# Patient Record
Sex: Male | Born: 1955 | Race: White | Hispanic: No | Marital: Single | Smoking: Never smoker
Health system: Southern US, Community
[De-identification: ages and names within clinical notes are randomized; demographics above are authoritative.]

## PROBLEM LIST (undated history)

## (undated) DIAGNOSIS — E669 Obesity, unspecified: Secondary | ICD-10-CM

## (undated) DIAGNOSIS — E119 Type 2 diabetes mellitus without complications: Secondary | ICD-10-CM

## (undated) DIAGNOSIS — I10 Essential (primary) hypertension: Secondary | ICD-10-CM

## (undated) DIAGNOSIS — H269 Unspecified cataract: Secondary | ICD-10-CM

## (undated) DIAGNOSIS — M1990 Unspecified osteoarthritis, unspecified site: Secondary | ICD-10-CM

## (undated) DIAGNOSIS — I639 Cerebral infarction, unspecified: Secondary | ICD-10-CM

## (undated) DIAGNOSIS — E11319 Type 2 diabetes mellitus with unspecified diabetic retinopathy without macular edema: Secondary | ICD-10-CM

## (undated) DIAGNOSIS — I499 Cardiac arrhythmia, unspecified: Secondary | ICD-10-CM

## (undated) DIAGNOSIS — H35039 Hypertensive retinopathy, unspecified eye: Secondary | ICD-10-CM

## (undated) DIAGNOSIS — E785 Hyperlipidemia, unspecified: Secondary | ICD-10-CM

## (undated) HISTORY — DX: Hypertensive retinopathy, unspecified eye: H35.039

## (undated) HISTORY — PX: HERNIA REPAIR: SHX51

## (undated) HISTORY — PX: JOINT REPLACEMENT: SHX530

## (undated) HISTORY — DX: Hyperlipidemia, unspecified: E78.5

## (undated) HISTORY — DX: Type 2 diabetes mellitus without complications: E11.9

## (undated) HISTORY — PX: EYE SURGERY: SHX253

## (undated) HISTORY — DX: Obesity, unspecified: E66.9

## (undated) HISTORY — DX: Unspecified cataract: H26.9

## (undated) HISTORY — DX: Type 2 diabetes mellitus with unspecified diabetic retinopathy without macular edema: E11.319

## (undated) HISTORY — DX: Cerebral infarction, unspecified: I63.9

---

## 2019-01-11 ENCOUNTER — Other Ambulatory Visit: Payer: Self-pay

## 2019-01-11 DIAGNOSIS — Z20822 Contact with and (suspected) exposure to covid-19: Secondary | ICD-10-CM

## 2019-01-12 LAB — NOVEL CORONAVIRUS, NAA: SARS-CoV-2, NAA: NOT DETECTED

## 2019-02-27 ENCOUNTER — Ambulatory Visit (INDEPENDENT_AMBULATORY_CARE_PROVIDER_SITE_OTHER): Payer: 59

## 2019-02-27 ENCOUNTER — Ambulatory Visit (INDEPENDENT_AMBULATORY_CARE_PROVIDER_SITE_OTHER): Payer: 59 | Admitting: Registered Nurse

## 2019-02-27 ENCOUNTER — Other Ambulatory Visit: Payer: Self-pay

## 2019-02-27 ENCOUNTER — Encounter: Payer: Self-pay | Admitting: Registered Nurse

## 2019-02-27 VITALS — BP 180/100 | HR 89 | Temp 97.9°F | Ht 68.0 in | Wt 245.6 lb

## 2019-02-27 DIAGNOSIS — M25562 Pain in left knee: Secondary | ICD-10-CM

## 2019-02-27 DIAGNOSIS — Z7689 Persons encountering health services in other specified circumstances: Secondary | ICD-10-CM | POA: Diagnosis not present

## 2019-02-27 DIAGNOSIS — I152 Hypertension secondary to endocrine disorders: Secondary | ICD-10-CM | POA: Insufficient documentation

## 2019-02-27 DIAGNOSIS — I10 Essential (primary) hypertension: Secondary | ICD-10-CM | POA: Diagnosis not present

## 2019-02-27 MED ORDER — TRAMADOL HCL 50 MG PO TABS: 50.0000 mg | ORAL_TABLET | Freq: Three times a day (TID) | ORAL | 0 refills | Status: AC | PRN

## 2019-02-27 MED ORDER — AMLODIPINE BESYLATE 5 MG PO TABS: 5.0000 mg | ORAL_TABLET | Freq: Every day | ORAL | 0 refills | Status: DC

## 2019-02-27 MED ORDER — MELOXICAM 15 MG PO TABS: 15.0000 mg | ORAL_TABLET | Freq: Every day | ORAL | 0 refills | Status: DC

## 2019-02-27 NOTE — Progress Notes (Signed)
New Patient Office Visit  Subjective:  Patient ID: Joe Stewart, male    DOB: 1955/08/25  Age: 64 y.o. MRN: [id number]  CC:  Chief Complaint  Patient presents with  . New Patient (Initial Visit)    patient states a few months ago some equipment fell on his left knee and getting worse having more pain and also swollen.     HPI Jeray Gentili presents for visit to establish care. C/o L knee pain. Otherwise, no concerns. BP is elevated at today's visit.   L Knee pain: originally onset a few months ago when a piece of equipment fell on his knee, though he didn't feel this was bad enough to warrant management. However, he hit it on a piece of plywood recently and is now in severe pain. At rest, it is lessened. Less painful when extended, he is having a hard time flexing it due to pain. Occasionally hip is sore, but this is relieved with heat pack, he believes this to be related to his recent limping and favoring that leg.   History reviewed. No pertinent past medical history.  Past Surgical History:  Procedure Laterality Date  . HERNIA REPAIR      Family History  Problem Relation Age of Onset  . Cancer Mother   . Diabetes Mother   . COPD Mother   . Heart disease Father   . COPD Sister   . Healthy Brother   . Healthy Son     Social History   Socioeconomic History  . Marital status: Unknown    Spouse name: Not on file  . Number of children: Not on file  . Years of education: Not on file  . Highest education level: Not on file  Occupational History  . Not on file  Tobacco Use  . Smoking status: Never Smoker  . Smokeless tobacco: Never Used  Substance and Sexual Activity  . Alcohol use: Yes    Alcohol/week: 3.0 standard drinks    Types: 3 Shots of liquor per week  . Drug use: Never  . Sexual activity: Yes  Other Topics Concern  . Not on file  Social History Narrative  . Not on file   Social Determinants of Health   Financial Resource Strain:   . Difficulty of  Paying Living Expenses: Not on file  Food Insecurity:   . Worried About Charity fundraiser in the Last Year: Not on file  . Ran Out of Food in the Last Year: Not on file  Transportation Needs:   . Lack of Transportation (Medical): Not on file  . Lack of Transportation (Non-Medical): Not on file  Physical Activity:   . Days of Exercise per Week: Not on file  . Minutes of Exercise per Session: Not on file  Stress:   . Feeling of Stress : Not on file  Social Connections:   . Frequency of Communication with Friends and Family: Not on file  . Frequency of Social Gatherings with Friends and Family: Not on file  . Attends Religious Services: Not on file  . Active Member of Clubs or Organizations: Not on file  . Attends Archivist Meetings: Not on file  . Marital Status: Not on file  Intimate Partner Violence:   . Fear of Current or Ex-Partner: Not on file  . Emotionally Abused: Not on file  . Physically Abused: Not on file  . Sexually Abused: Not on file    ROS Review of Systems  Constitutional: Negative.  HENT: Negative.   Eyes: Negative.   Respiratory: Negative.   Cardiovascular: Negative.  Negative for chest pain, palpitations and leg swelling.  Gastrointestinal: Negative.   Endocrine: Negative.   Genitourinary: Negative.   Musculoskeletal: Positive for arthralgias (L knee, per hpi), gait problem (d/t L knee pain) and joint swelling (L knee). Negative for back pain, myalgias, neck pain and neck stiffness.  Skin: Negative.   Allergic/Immunologic: Negative.   Hematological: Negative.   Psychiatric/Behavioral: Negative.   All other systems reviewed and are negative.   Objective:   Today's Vitals: Pulse 89   Temp 97.9 F (36.6 C) (Temporal)   Ht 5\' 8"  (1.727 m)   Wt 245 lb 9.6 oz (111.4 kg)   SpO2 96%   BMI 37.34 kg/m   Physical Exam Vitals and nursing note reviewed.  Constitutional:      General: He is not in acute distress.    Appearance: Normal  appearance. He is obese. He is not ill-appearing, toxic-appearing or diaphoretic.  Cardiovascular:     Rate and Rhythm: Normal rate and regular rhythm.     Heart sounds: Normal heart sounds.  Pulmonary:     Effort: Pulmonary effort is normal. No respiratory distress.  Musculoskeletal:        General: Swelling, tenderness and signs of injury present. No deformity.     Right knee: Normal.     Left knee: Swelling, effusion and erythema present. Decreased range of motion. Tenderness present over the medial joint line. Abnormal alignment. Normal pulse.     Right lower leg: No edema.     Left lower leg: No edema.  Skin:    Capillary Refill: Capillary refill takes less than 2 seconds.  Neurological:     General: No focal deficit present.     Mental Status: He is alert and oriented to person, place, and time. Mental status is at baseline.     Cranial Nerves: No cranial nerve deficit.  Psychiatric:        Mood and Affect: Mood normal.        Behavior: Behavior normal.        Thought Content: Thought content normal.        Judgment: Judgment normal.     Assessment & Plan:   Problem List Items Addressed This Visit      Cardiovascular and Mediastinum   Essential hypertension   Relevant Medications   amLODipine (NORVASC) 5 MG tablet     Other   Acute pain of left knee - Primary   Relevant Medications   meloxicam (MOBIC) 15 MG tablet   traMADol (ULTRAM) 50 MG tablet   Other Relevant Orders   DG Knee Complete 4 Views Left (Completed)   Ambulatory referral to Orthopedic Surgery    Other Visit Diagnoses    Encounter to establish care          Outpatient Encounter Medications as of 02/27/2019  Medication Sig  . amLODipine (NORVASC) 5 MG tablet Take 1 tablet (5 mg total) by mouth daily.  . meloxicam (MOBIC) 15 MG tablet Take 1 tablet (15 mg total) by mouth daily.  . traMADol (ULTRAM) 50 MG tablet Take 1 tablet (50 mg total) by mouth every 8 (eight) hours as needed for up to 5 days.     No facility-administered encounter medications on file as of 02/27/2019.    Follow-up: No follow-ups on file.   PLAN  Acute pain in L knee, xrays show joint space loss across entire joint, worst medially.  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 Referral to ortho placed  meloxicam daily and tramadol nightly for pain relief  Work note sent via MyChart  Amlodipine 5mg  Po qd for htn, recheck following visit with ortho and pain control  Patient encouraged to call clinic with any questions, comments, or concerns.  [name], NP

## 2019-02-27 NOTE — Patient Instructions (Signed)
° ° ° °  If you have lab work done today you will be contacted with your lab results within the next 2 weeks.  If you have not heard from us then please contact us. The fastest way to get your results is to register for My Chart. ° ° °IF you received an x-ray today, you will receive an invoice from [facility]. Please contact [facility] at [phone] with questions or concerns regarding your invoice.  ° °IF you received labwork today, you will receive an invoice from [facility]. Please contact [facility] at [phone] with questions or concerns regarding your invoice.  ° °Our billing staff will not be able to assist you with questions regarding bills from these companies. ° °You will be contacted with the lab results as soon as they are available. The fastest way to get your results is to activate your My Chart account. Instructions are located on the last page of this paperwork. If you have not heard from us regarding the results in 2 weeks, please contact this office. °  ° ° ° °

## 2019-03-02 ENCOUNTER — Other Ambulatory Visit: Payer: Self-pay

## 2019-03-02 ENCOUNTER — Ambulatory Visit (INDEPENDENT_AMBULATORY_CARE_PROVIDER_SITE_OTHER): Payer: 59 | Admitting: Orthopedic Surgery

## 2019-03-02 ENCOUNTER — Encounter: Payer: Self-pay | Admitting: Orthopedic Surgery

## 2019-03-02 DIAGNOSIS — M1712 Unilateral primary osteoarthritis, left knee: Secondary | ICD-10-CM

## 2019-03-02 MED ORDER — LIDOCAINE HCL 1 % IJ SOLN
5.0000 mL | INTRAMUSCULAR | Status: AC | PRN
  Administered 2019-03-02: 14:00:00 5 mL

## 2019-03-02 NOTE — Progress Notes (Signed)
Office Visit Note   Patient: Joe Stewart           Date of Birth: 06/08/1955           MRN: [id number] Visit Date: 03/02/2019 Requested by: [name], NP [place],  [place] [address] PCP: [name], NP  Subjective: Chief Complaint  Patient presents with  . Left Knee - Pain    HPI: Joe Stewart is a patient with left knee pain.  He states that several months ago's some equipment fell on his knee at work and then also had an injury where there was plywood involved when he stepped into a van.  This happened about 6 months ago.  He has been having some progressive pain and swelling since that time.  Uses a brace which helps along with meloxicam and tramadol.  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  He states that his knee is getting more bowed on the left-hand side.  He has radiographs from 02/27/2019 which are reviewed which shows abnormal lucencies on the medial femoral condyle and medial tibial plateau consistent with either AVN subchondral collapse or some other type of localized arthritic process.              ROS: All systems reviewed are negative as they relate to the chief complaint within the history of present illness.  Patient denies  fevers or chills.   Assessment & Plan: Visit Diagnoses:  1. Arthritis of left knee     Plan: Impression is left knee pain with progressive varus deformity and primarily medial compartment craterization of the joint space.  Plan is MRI scan to get a better picture of exactly what the pathologic process is that is causing his pain swelling and deformity.  I advised him to minimize amount of walking that he is doing until we get the scan.  I do see that some type of reconstructive procedure is likely in his future.  Aspiration of the knee is performed today.  No injection performed due to the possibility of surgery in his future.  Follow-Up Instructions: Return for  after MRI.   Orders:  No orders of the defined types were placed in this encounter.  No orders of the defined types were placed in this encounter.     Procedures: Large Joint Inj: L knee on 03/02/2019 2:20 PM Indications: diagnostic evaluation, joint swelling and pain Details: 18 G 1.5 in needle, superolateral approach  Arthrogram: No  Medications: 5 mL lidocaine 1 % Aspirate: 30 mL blood-tinged Outcome: tolerated well, no immediate complications Procedure, treatment alternatives, risks and benefits explained, specific risks discussed. Consent was given by the patient. Immediately prior to procedure a time out was called to verify the correct patient, procedure, equipment, support staff and site/side marked as required. Patient was prepped and draped in the usual sterile fashion.       Clinical Data: No additional findings.  Objective: Vital Signs: There were no vitals taken for this visit.  Physical Exam:   Constitutional: Patient appears well-developed HEENT:  Head: Normocephalic Eyes:EOM are normal Neck: Normal range of motion Cardiovascular: Normal rate Pulmonary/chest: Effort normal Neurologic: Patient is alert Skin: Skin is warm Psychiatric: Patient has normal mood and affect    Ortho Exam: Ortho exam demonstrates 2+ pitting edema bilateral lower extremities with no calf tenderness.  He does have varus alignment left leg not in the right-hand side.  Flexion is past  90 degrees on both sides.  Mild effusion is present on the left-hand side.  Does have venous stasis changes in both lower extremities from the mid calf distal.  Extensor mechanism is intact bilaterally.  He lacks about 5 degrees of full extension on the left compared to full extension on the right.  Specialty Comments:  No specialty comments available.  Imaging: No results found.   PMFS History: Patient Active Problem List   Diagnosis Date Noted  . Essential hypertension 02/27/2019  . Acute  pain of left knee 02/27/2019   History reviewed. No pertinent past medical history.  Family History  Problem Relation Age of Onset  . Cancer Mother   . Diabetes Mother   . COPD Mother   . Heart disease Father   . COPD Sister   . Healthy Brother   . Healthy Son     Past Surgical History:  Procedure Laterality Date  . HERNIA REPAIR     Social History   Occupational History  . Not on file  Tobacco Use  . Smoking status: Never Smoker  . Smokeless tobacco: Never Used  Substance and Sexual Activity  . Alcohol use: Yes    Alcohol/week: 3.0 standard drinks    Types: 3 Shots of liquor per week  . Drug use: Never  . Sexual activity: Yes

## 2019-03-02 NOTE — Addendum Note (Signed)
Addended by: Precious Bard on: 03/02/2019 03:21 PM   Modules accepted: Orders

## 2019-03-04 ENCOUNTER — Ambulatory Visit (HOSPITAL_BASED_OUTPATIENT_CLINIC_OR_DEPARTMENT_OTHER)
Admission: RE | Admit: 2019-03-04 | Discharge: 2019-03-04 | Disposition: A | Discharge status: Home / Self Care | Payer: 59 | Source: Ambulatory Visit | Attending: Orthopedic Surgery | Admitting: Orthopedic Surgery

## 2019-03-04 ENCOUNTER — Other Ambulatory Visit: Payer: Self-pay

## 2019-03-04 DIAGNOSIS — M1712 Unilateral primary osteoarthritis, left knee: Secondary | ICD-10-CM | POA: Insufficient documentation

## 2019-03-06 ENCOUNTER — Telehealth: Payer: Self-pay | Admitting: Orthopedic Surgery

## 2019-03-06 NOTE — Telephone Encounter (Signed)
Called patient left message to return call to schedule an appointment for MRI review with Dr dean     MRI was scheduled for 03/04/2019

## 2019-03-06 NOTE — Telephone Encounter (Signed)
Pt called in to get scheduled for an appt for mri review, I scheduled him for Thursday 03/09/19 @ 9:30, put had the mri Saturday 03/04/19 at another facility other than [facility], have we gotten the results to his mri?

## 2019-03-06 NOTE — Telephone Encounter (Signed)
I s/w patient Advised that we are able to access images. He did have it done at a [facility] facility.

## 2019-03-09 ENCOUNTER — Other Ambulatory Visit: Payer: Self-pay

## 2019-03-09 ENCOUNTER — Ambulatory Visit (INDEPENDENT_AMBULATORY_CARE_PROVIDER_SITE_OTHER): Payer: 59 | Admitting: Orthopedic Surgery

## 2019-03-09 DIAGNOSIS — M1712 Unilateral primary osteoarthritis, left knee: Secondary | ICD-10-CM

## 2019-03-10 ENCOUNTER — Encounter: Payer: Self-pay | Admitting: Orthopedic Surgery

## 2019-03-10 NOTE — Progress Notes (Signed)
Office Visit Note   Patient: Joe Stewart           Date of Birth: 03-04-1955           MRN: [id number] Visit Date: 03/09/2019 Requested by: Joe Coss, NP [place],  [place] [address] PCP: Joe Coss, NP  Subjective: Chief Complaint  Patient presents with  . Follow-up    HPI: [name] is a 64 year old patient with left knee pain.  Aspirated his left knee last clinic visit which helped him about 30%.  He has had some progressive worsening knee pain since an event which happened while he was working about 6 months ago.  Reports primarily left-sided medial knee pain.  MRI scan shows significant medial compartment arthritis with some collapse of the femoral joint surface and significant edema in the medial tibial plateau.  No fractures present.  No personal history of DVT or pulmonary embolism.  No prior knee surgery.              ROS: All systems reviewed are negative as they relate to the chief complaint within the history of present illness.  Patient denies  fevers or chills.   Assessment & Plan: Visit Diagnoses:  1. Arthritis of left knee     Plan: Impression is medial compartment arthritis with milder arthritis in the lateral and patellofemoral compartments.  Patient does have varus collapse and significantly antalgic gait.  Plan is left total knee replacement.  Risks and benefits are discussed including not limited to infection nerve vessel damage incomplete pain relief as well as incomplete restoration of function.  Currently his flexion is only about 95 degrees.  In general preop flexion is the best predictor of postop flexion.  Hopefully we can get him a little bit more flexion after the replacement.  Patient understands risk and benefits as well as the rehabilitation involved.  I think this can be his best bet at recovery.  Would like to use press-fit knee replacement if possible and bone quality allows for this younger patient.  All questions answered.  Follow-Up  Instructions: No follow-ups on file.   Orders:  No orders of the defined types were placed in this encounter.  No orders of the defined types were placed in this encounter.     Procedures: No procedures performed   Clinical Data: No additional findings.  Objective: Vital Signs: There were no vitals taken for this visit.  Physical Exam:   Constitutional: Patient appears well-developed HEENT:  Head: Normocephalic Eyes:EOM are normal Neck: Normal range of motion Cardiovascular: Normal rate Pulmonary/chest: Effort normal Neurologic: Patient is alert Skin: Skin is warm Psychiatric: Patient has normal mood and affect    Ortho Exam: Ortho exam demonstrates full active and passive range of motion of that right knee.  Left knee has about 95 degrees of flexion and lacks 10 degrees of full extension.  Varus alignment is present.  About 1-2+ pitting edema present in bilateral lower extremities.  Pedal pulses palpable.  No groin pain with internal extra rotation of the leg.  No skin changes in that left knee region. ecialty Comments:  No specialty comments available.  Imaging: No results found.   PMFS History: Patient Active Problem List   Diagnosis Date Noted  . Essential hypertension 02/27/2019  . Acute pain of left knee 02/27/2019   History reviewed. No pertinent past medical history.  Family History  Problem Relation Age of Onset  . Cancer Mother   . Diabetes Mother   .  COPD Mother   . Heart disease Father   . COPD Sister   . Healthy Brother   . Healthy Son     Past Surgical History:  Procedure Laterality Date  . HERNIA REPAIR     Social History   Occupational History  . Not on file  Tobacco Use  . Smoking status: Never Smoker  . Smokeless tobacco: Never Used  Substance and Sexual Activity  . Alcohol use: Yes    Alcohol/week: 3.0 standard drinks    Types: 3 Shots of liquor per week  . Drug use: Never  . Sexual activity: Yes

## 2019-03-21 ENCOUNTER — Other Ambulatory Visit: Payer: Self-pay | Admitting: Registered Nurse

## 2019-03-21 DIAGNOSIS — M25562 Pain in left knee: Secondary | ICD-10-CM

## 2019-03-21 NOTE — Pre-Procedure Instructions (Signed)
Joe Stewart  03/21/2019      [id number] [name], [place] [address] Phone: [phone] Fax: [phone]    Your procedure is scheduled on 03/28/19.  Report to [facility] Admitting at 530 A.M.  Call this number if you have problems the morning of surgery:  [phone]   Remember:  Do not eat or drink after midnight.  You may drink clear liquids until 430am .  Clear liquids allowed are:                    Water, Juice (non-citric and without pulp), Carbonated beverages, Clear Tea and Black Coffee only    Take these medicines the morning of surgery with A SIP OF WATER -----NORVASC,ULTRAM    Do not wear jewelry, make-up or nail polish.  Do not wear lotions, powders, or perfumes, or deodorant.  Do not shave 48 hours prior to surgery.  Men may shave face and neck.  Do not bring valuables to the hospital.  [facility] is not responsible for any belongings or valuables.  Contacts, dentures or bridgework may not be worn into surgery.  Leave your suitcase in the car.  After surgery it may be brought to your room.  For patients admitted to the hospital, discharge time will be determined by your treatment team.  Patients discharged the day of surgery will not be allowed to drive home.   Enhanced Recovery after Surgery for Orthopedics Enhanced Recovery after Surgery is a protocol used to improve the stress on your body and your recovery after surgery.  Patient Instructions  . The night before surgery:  o No food after midnight. ONLY clear liquids after midnight  .  Marland Kitchen The day of surgery (if you do NOT have diabetes):  o Drink ONE (1) Pre-Surgery Clear Ensure as directed.   o This drink was given to you during your hospital  pre-op appointment visit. o The pre-op nurse will instruct you on the time to drink the  Pre-Surgery Ensure depending on your surgery time. o  Finish the drink at the designated time by the pre-op nurse.  o Nothing else to drink after completing the  Pre-Surgery Clear Ensure.  . The day of surgery (if you have diabetes): o  o Drink ONE (1) Gatorade 2 (G2) as directed. o This drink was given to you during your hospital  pre-op appointment visit.  o The pre-op nurse will instruct you on the time to drink the   Gatorade 2 (G2) depending on your surgery time. o Color of the Gatorade may vary. Red is not allowed. o Nothing else to drink after completing the  Gatorade 2 (G2).         If you have questions, please contact your surgeon's office.Do not take any aspirin,anti-inflammatories,vitamins,or herbal supplements 5-7 days prior to surgery.  Special instructions: [facility] - Preparing for Surgery  Before surgery, you can play an important role.  Because skin is not sterile, your skin needs to be as free of germs as possible.  You can reduce the number of germs on you skin by washing with CHG (chlorahexidine gluconate) soap before surgery.  CHG is an antiseptic cleaner which kills germs and bonds with the skin to continue killing germs even after washing.  Oral Hygiene is also important in reducing the risk of infection.  Remember to brush your teeth with your  regular toothpaste the morning of surgery.  Please DO NOT use if you have an allergy to CHG or antibacterial soaps.  If your skin becomes reddened/irritated stop using the CHG and inform your nurse when you arrive at Short Stay.  Do not shave (including legs and underarms) for at least 48 hours prior to the first CHG shower.  You may shave your face.  Please follow these instructions carefully:   1.  Shower with CHG Soap the night before surgery and the morning of Surgery.  2.  If you choose to wash your hair, wash your hair first as usual with your normal shampoo.  3.  After you shampoo, rinse your hair and body thoroughly to remove the shampoo. 4.  Use CHG as you would  any other liquid soap.  You can apply chg directly to the skin and wash gently with a      scrungie or washcloth.           5.  Apply the CHG Soap to your body ONLY FROM THE NECK DOWN.   Do not use on open wounds or open sores. Avoid contact with your eyes, ears, mouth and genitals (private parts).  Wash genitals (private parts) with your normal soap.  6.  Wash thoroughly, paying special attention to the area where your surgery will be performed.  7.  Thoroughly rinse your body with warm water from the neck down.  8.  DO NOT shower/wash with your normal soap after using and rinsing off the CHG Soap.  9.  Pat yourself dry with a clean towel.            10.  Wear clean pajamas.            11.  Place clean sheets on your bed the night of your first shower and do not sleep with pets.  Day of Surgery  Do not apply any lotions/deoderants the morning of surgery.   Please wear clean clothes to the hospital/surgery center. Remember to brush your teeth with toothpaste.    Please read over the following fact sheets that you were given. MRSA Information

## 2019-03-22 ENCOUNTER — Encounter (HOSPITAL_COMMUNITY)
Admission: RE | Admit: 2019-03-22 | Discharge: 2019-03-22 | Disposition: A | Discharge status: Home / Self Care | Payer: 59 | Source: Ambulatory Visit | Attending: Orthopedic Surgery | Admitting: Orthopedic Surgery

## 2019-03-22 ENCOUNTER — Encounter (HOSPITAL_COMMUNITY): Payer: Self-pay

## 2019-03-22 ENCOUNTER — Other Ambulatory Visit: Payer: Self-pay

## 2019-03-22 DIAGNOSIS — I4510 Unspecified right bundle-branch block: Secondary | ICD-10-CM | POA: Diagnosis not present

## 2019-03-22 DIAGNOSIS — R9431 Abnormal electrocardiogram [ECG] [EKG]: Secondary | ICD-10-CM | POA: Insufficient documentation

## 2019-03-22 DIAGNOSIS — I10 Essential (primary) hypertension: Secondary | ICD-10-CM | POA: Insufficient documentation

## 2019-03-22 DIAGNOSIS — Z01818 Encounter for other preprocedural examination: Secondary | ICD-10-CM | POA: Diagnosis present

## 2019-03-22 HISTORY — DX: Unspecified osteoarthritis, unspecified site: M19.90

## 2019-03-22 HISTORY — DX: Essential (primary) hypertension: I10

## 2019-03-22 LAB — SURGICAL PCR SCREEN
MRSA, PCR: NEGATIVE
Staphylococcus aureus: NEGATIVE

## 2019-03-22 LAB — BASIC METABOLIC PANEL
Anion gap: 13 (ref 5–15)
BUN: 20 mg/dL (ref 8–23)
CO2: 23 mmol/L (ref 22–32)
Calcium: 9.5 mg/dL (ref 8.9–10.3)
Chloride: 103 mmol/L (ref 98–111)
Creatinine, Ser: 0.92 mg/dL (ref 0.61–1.24)
GFR calc Af Amer: >60 mL/min (ref >60)
GFR calc non Af Amer: >60 mL/min (ref >60)
Glucose, Bld: 185 mg/dL — ABNORMAL HIGH (ref 70–99)
Potassium: 4.6 mmol/L (ref 3.5–5.1)
Sodium: 139 mmol/L (ref 135–145)

## 2019-03-22 LAB — URINALYSIS, ROUTINE W REFLEX MICROSCOPIC
Bilirubin Urine: NEGATIVE
Glucose, UA: NEGATIVE mg/dL
Hgb urine dipstick: NEGATIVE
Ketones, ur: 5 mg/dL — AB
Leukocytes,Ua: NEGATIVE
Nitrite: NEGATIVE
Protein, ur: 30 mg/dL — AB
Specific Gravity, Urine: 1.025 (ref 1.005–1.030)
pH: 5 (ref 5.0–8.0)

## 2019-03-22 LAB — CBC
HCT: 43.1 % (ref 39.0–52.0)
Hemoglobin: 13.8 g/dL (ref 13.0–17.0)
MCH: 30.1 pg (ref 26.0–34.0)
MCHC: 32 g/dL (ref 30.0–36.0)
MCV: 93.9 fL (ref 80.0–100.0)
Platelets: 254 10*3/uL (ref 150–400)
RBC: 4.59 MIL/uL (ref 4.22–5.81)
RDW: 13.2 % (ref 11.5–15.5)
WBC: 7.1 10*3/uL (ref 4.0–10.5)
nRBC: 0 % (ref 0.0–0.2)

## 2019-03-22 NOTE — Pre-Procedure Instructions (Signed)
Your procedure is scheduled on Tuesday, February 23rd, from 07:30 AM to 10:28 AM.  Report to [facility] Main Entrance "A" at 05:30 A.M., and check in at the Admitting office.  Call this number if you have problems the morning of surgery:  [phone]  Call [phone] if you have any questions prior to your surgery date Monday-Friday 8am-4pm    Remember:  Do not eat after midnight the night before your surgery.  You may drink clear liquids until 04:30 AM the morning of your surgery.    Clear liquids allowed are: Water, Non-Citrus Juices (without pulp), Carbonated Beverages, Clear Tea, Black Coffee Only, and Gatorade  Please complete your PRE-SURGERY ENSURE that was provided to you by 04:30 AM the morning of surgery.  Please, if able, drink it in one setting. DO NOT SIP.     Take these medicines the morning of surgery with A SIP OF WATER : amLODipine (NORVASC) traMADol (ULTRAM)- if needed   7 days prior to surgery STOP taking any Aspirin (unless otherwise instructed by your surgeon), meloxicam (MOBIC), Aleve, Naproxen, Ibuprofen, Motrin, Advil, Goody's, BC's, all herbal medications, fish oil, and all vitamins.    The Morning of Surgery  Do not wear jewelry.  Do not wear lotions, powders, colognes, or deodorant  Men may shave face and neck.  Do not bring valuables to the hospital.  [facility] is not responsible for any belongings or valuables.  If you are a smoker, DO NOT Smoke 24 hours prior to surgery  If you wear a CPAP at night please bring your mask the morning of surgery   Remember that you must have someone to transport you home after your surgery, and remain with you for 24 hours if you are discharged the same day.   Please bring cases for contacts, glasses, hearing aids, dentures or bridgework because it cannot be worn into surgery.    Leave your suitcase in the car.  After surgery it may be brought to your room.  For patients admitted to the hospital,  discharge time will be determined by your treatment team.  Patients discharged the day of surgery will not be allowed to drive home.    Special instructions:   [place]- Preparing For Surgery  Before surgery, you can play an important role. Because skin is not sterile, your skin needs to be as free of germs as possible. You can reduce the number of germs on your skin by washing with CHG (chlorahexidine gluconate) Soap before surgery.  CHG is an antiseptic cleaner which kills germs and bonds with the skin to continue killing germs even after washing.    Oral Hygiene is also important to reduce your risk of infection.  Remember - BRUSH YOUR TEETH THE MORNING OF SURGERY WITH YOUR REGULAR TOOTHPASTE  Please do not use if you have an allergy to CHG or antibacterial soaps. If your skin becomes reddened/irritated stop using the CHG.  Do not shave (including legs and underarms) for at least 48 hours prior to first CHG shower. It is OK to shave your face.  Please follow these instructions carefully.   1. Shower the NIGHT BEFORE SURGERY and the MORNING OF SURGERY with CHG Soap.   2. If you chose to wash your hair, wash your hair first as usual with your normal shampoo.  3. After you shampoo, rinse your hair and body thoroughly to remove the shampoo.  4. Use CHG as you would any other liquid soap. You can apply  CHG directly to the skin and wash gently with a scrungie or a clean washcloth.   5. Apply the CHG Soap to your body ONLY FROM THE NECK DOWN.  Do not use on open wounds or open sores. Avoid contact with your eyes, ears, mouth and genitals (private parts). Wash Face and genitals (private parts)  with your normal soap.   6. Wash thoroughly, paying special attention to the area where your surgery will be performed.  7. Thoroughly rinse your body with warm water from the neck down.  8. DO NOT shower/wash with your normal soap after using and rinsing off the CHG Soap.  9. Pat yourself dry  with a CLEAN TOWEL.  10. Wear CLEAN PAJAMAS to bed the night before surgery, wear comfortable clothes the morning of surgery  11. Place CLEAN SHEETS on your bed the night of your first shower and DO NOT SLEEP WITH PETS.    Day of Surgery:  Please shower the morning of surgery with the CHG soap Do not apply any deodorants/lotions. Please wear clean clothes to the hospital/surgery center.   Remember to brush your teeth WITH YOUR REGULAR TOOTHPASTE.   Please read over the following fact sheets that you were given.

## 2019-03-22 NOTE — Progress Notes (Addendum)
PCP - Nedra Hai, NP Cardiologist - Denies  PPM/ICD - [place]  Chest x-ray - N/A EKG - 03/22/19 Stress Test - Denies ECHO - Denies Cardiac Cath - Denies  Sleep Study - Denies  Patient denies being a diabetic.  Blood Thinner Instructions: N/A Aspirin Instructions: N/A  ERAS Protcol - Yes PRE-SURGERY Ensure or G2- Ensure given.   COVID TEST- 03/24/19   Anesthesia review: Yes, abnormal EKG.  Patient denies shortness of breath, fever, cough and chest pain at PAT appointment   All instructions explained to the patient, with a verbal understanding of the material. Patient agrees to go over the instructions while at home for a better understanding. Patient also instructed to self quarantine after being tested for COVID-19. The opportunity to ask questions was provided.

## 2019-03-22 NOTE — Progress Notes (Signed)
   03/22/19 0825  OBSTRUCTIVE SLEEP APNEA  Have you ever been diagnosed with sleep apnea through a sleep study? No  Do you snore loudly (loud enough to be heard through closed doors)?  0  Do you often feel tired, fatigued, or sleepy during the daytime (such as falling asleep during driving or talking to someone)? 0  Has anyone observed you stop breathing during your sleep? 0  Do you have, or are you being treated for high blood pressure? 1  BMI more than 35 kg/m2? 1  Age > 50 (1-yes) 1  Neck circumference greater than:Male 16 inches or larger, Male 17inches or larger? 1  Male Gender (Yes=1) 1  Obstructive Sleep Apnea Score 5  Score 5 or greater  Results sent to PCP

## 2019-03-23 ENCOUNTER — Encounter (HOSPITAL_COMMUNITY): Payer: Self-pay | Admitting: Physician Assistant

## 2019-03-23 LAB — URINE CULTURE: Culture: NO GROWTH

## 2019-03-24 ENCOUNTER — Telehealth: Payer: Self-pay | Admitting: Orthopedic Surgery

## 2019-03-24 ENCOUNTER — Other Ambulatory Visit: Payer: Self-pay

## 2019-03-24 ENCOUNTER — Ambulatory Visit: Payer: 59 | Admitting: Cardiology

## 2019-03-24 ENCOUNTER — Other Ambulatory Visit (HOSPITAL_COMMUNITY): Payer: 59

## 2019-03-24 ENCOUNTER — Encounter: Payer: Self-pay | Admitting: Cardiology

## 2019-03-24 VITALS — BP 180/95 | HR 93 | Temp 98.1°F | Ht 68.0 in | Wt 245.2 lb

## 2019-03-24 DIAGNOSIS — I10 Essential (primary) hypertension: Secondary | ICD-10-CM

## 2019-03-24 DIAGNOSIS — E6609 Other obesity due to excess calories: Secondary | ICD-10-CM

## 2019-03-24 DIAGNOSIS — I4510 Unspecified right bundle-branch block: Secondary | ICD-10-CM

## 2019-03-24 DIAGNOSIS — Z01818 Encounter for other preprocedural examination: Secondary | ICD-10-CM

## 2019-03-24 DIAGNOSIS — R0609 Other forms of dyspnea: Secondary | ICD-10-CM

## 2019-03-24 DIAGNOSIS — E66812 Obesity, class 2: Secondary | ICD-10-CM

## 2019-03-24 MED ORDER — HYDROCHLOROTHIAZIDE 12.5 MG PO CAPS: 12.5000 mg | ORAL_CAPSULE | Freq: Every day | ORAL | 0 refills | Status: DC

## 2019-03-24 MED ORDER — AMLODIPINE BESYLATE 10 MG PO TABS: 10.0000 mg | ORAL_TABLET | Freq: Every day | ORAL | 1 refills | Status: DC

## 2019-03-24 NOTE — Progress Notes (Addendum)
REASON FOR CONSULT: Preoperative risk stratification.  Chief Complaint  Patient presents with  . Pre Op Clearance    pt having left knee surgery 03/28/19  . New Patient (Initial Visit)    REQUESTING PHYSICIAN:  [name], NP [place],  [place] [id number]  HPI  Joe Stewart is a 64 y.o. male who presents to the office with a chief complaint of " cardiac clearance." Patient's past medical history and cardiac risk factors include: Hypertension, obesity, osteoarthritis.  Patient is referred to the office at the request of Dr. Marlou Sa of [facility] for preoperative or stratification prior to her upcoming left knee replacement currently scheduled for March 28, 2019.  Patient went for preoperative lab work and testing and he was informed that his EKG is abnormal therefore cardiology consultation was requested.  Currently patient denies any active chest pain at rest or with effort related activities.  He does get shortness of breath with effort related activities but he is attributing this to his obesity and elevated blood pressures.  Patient is initial blood pressure was 216/105 with a heart rate of 83 bpm on repeat evaluation and systolic blood pressure was 180/95.  Patient states that he is currently on Norvasc 5 mg p.o. daily.  He does not check his blood pressures at home.  And has never had cardiac evaluation. Denies prior history of coronary artery disease, myocardial infarction, congestive heart failure, deep venous thrombosis, pulmonary embolism, stroke, transient ischemic attack.  No known history of aortic stenosis.  His overall functional status is limited secondary to knee osteoarthritis.  He does not participate in any structured exercise program.   Review of systems positive for: shortness of breath at rest or effort related symptoms. Currently patient denies chest pain,  lightheadedness, dizziness, palpitations, orthopnea, paroxysmal nocturnal dyspnea, lower  extremity swelling, near syncope, syncopal events, hematochezia, hemoptysis, hematemesis, melanotic stools, no symptoms of amaurosis fugax, motor or sensory symptoms or dysphasia in the last 6 months.   FUNCTIONAL STATUS: No structured routine or exercise program.    ALLERGIES: No Known Allergies  MEDICATION LIST PRIOR TO VISIT: Current Outpatient Medications on File Prior to Visit  Medication Sig Dispense Refill  . acetaminophen (TYLENOL) 500 MG tablet Take 500 mg by mouth every 6 (six) hours as needed.    . traMADol (ULTRAM) 50 MG tablet Take 50 mg by mouth every 8 (eight) hours as needed for moderate pain.     No current facility-administered medications on file prior to visit.    PAST MEDICAL HISTORY: Past Medical History:  Diagnosis Date  . Arthritis   . Hypertension   . Obesity     PAST SURGICAL HISTORY: Past Surgical History:  Procedure Laterality Date  . HERNIA REPAIR    . HERNIA REPAIR     35 years ago    FAMILY HISTORY: The patient family history includes COPD in his mother and sister; Cancer in his mother; Diabetes in his mother; Healthy in his brother and son; Heart disease in his father.   SOCIAL HISTORY:  The patient  reports that he has never smoked. He has never used smokeless tobacco. He reports previous alcohol use. He reports that he does not use drugs.  14 ORGAN REVIEW OF SYSTEMS: CONSTITUTIONAL: No fever or significant weight loss EYES: No recent significant visual change EARS, NOSE, MOUTH, THROAT: No recent significant change in hearing CARDIOVASCULAR: See discussion in subjective/HPI RESPIRATORY: See discussion in subjective/HPI GASTROINTESTINAL: No recent complaints of abdominal pain GENITOURINARY: No  recent significant change in genitourinary status MUSCULOSKELETAL: No recent significant change in musculoskeletal status INTEGUMENTARY: No recent rash NEUROLOGIC: No recent significant change in motor function PSYCHIATRIC: No recent significant  change in mood ENDOCRINOLOGIC: No recent significant change in endocrine status HEMATOLOGIC/LYMPHATIC: No recent significant unexpected bruising ALLERGIC/IMMUNOLOGIC: No recent unexplained allergic reaction  PHYSICAL EXAM: Vitals with BMI 03/24/2019 03/22/2019 02/27/2019  Height 5\' 8"  5\' 8"  5\' 8"   Weight 245 lbs 3 oz 244 lbs 245 lbs 10 oz  BMI 37.29 [id number]  Systolic [id number]  Diastolic 95 89 [id number]  Pulse 93 90 89   CONSTITUTIONAL: Well-developed and well-nourished. No acute distress.  SKIN: Skin is warm and dry. No rash noted. No cyanosis. No pallor. No jaundice HEAD: Normocephalic and atraumatic.  EYES: No scleral icterus MOUTH/THROAT: Moist oral membranes.  NECK: No JVD present. No thyromegaly noted. No carotid bruits  LYMPHATIC: No visible cervical adenopathy.  CHEST Normal respiratory effort. No intercostal retractions  LUNGS: Clear to auscultations bilaterally.  No stridor. No wheezes. No rales.  CARDIOVASCULAR: Regular rate and rhythm, positive S1-S2, no murmurs rubs or gallops appreciated. ABDOMINAL: Obese, soft, nontender, nondistended, positive bowel sounds all 4 quadrants.  No apparent ascites.  EXTREMITIES: No peripheral edema  HEMATOLOGIC: No significant bruising NEUROLOGIC: Oriented to person, place, and time. Nonfocal. Normal muscle tone.  PSYCHIATRIC: Normal mood and affect. Normal behavior. Cooperative  CARDIAC DATABASE: EKG: 03/24/2019: Normal sinus rhythm, ventricular rate 93 bpm, right bundle branch block, left atrial enlargement, ST-T changes most likely secondary to underlying right bundle branch block.  No apparent underlying ischemia or injury pattern.  Echocardiogram: None  Stress Testing:  None  Heart Catheterization: None   LABORATORY DATA: CBC Latest Ref Rng & Units 03/22/2019  WBC 4.0 - 10.5 K/uL 7.1  Hemoglobin 13.0 - 17.0 g/dL 13.8  Hematocrit 39.0 - 52.0 % 43.1  Platelets 150 - 400 K/uL 254    CMP Latest Ref Rng & Units 03/22/2019    Glucose 70 - 99 mg/dL 185(H)  BUN 8 - 23 mg/dL 20  Creatinine 0.61 - 1.24 mg/dL 0.92  Sodium 135 - 145 mmol/L 139  Potassium 3.5 - 5.1 mmol/L 4.6  Chloride 98 - 111 mmol/L 103  CO2 22 - 32 mmol/L 23  Calcium 8.9 - 10.3 mg/dL 9.5    Lipid Panel  No results found for: CHOL, TRIG, HDL, CHOLHDL, VLDL, LDLCALC, LDLDIRECT, LABVLDL  No results found for: HGBA1C No components found for: NTPROBNP No results found for: TSH  FINAL MEDICATION LIST END OF ENCOUNTER: Meds ordered this encounter  Medications  . amLODipine (NORVASC) 10 MG tablet    Sig: Take 1 tablet (10 mg total) by mouth daily.    Dispense:  90 tablet    Refill:  1  . hydrochlorothiazide (MICROZIDE) 12.5 MG capsule    Sig: Take 1 capsule (12.5 mg total) by mouth daily.    Dispense:  90 capsule    Refill:  0    Medications Discontinued During This Encounter  Medication Reason  . amLODipine (NORVASC) 5 MG tablet      Current Outpatient Medications:  .  acetaminophen (TYLENOL) 500 MG tablet, Take 500 mg by mouth every 6 (six) hours as needed., Disp: , Rfl:  .  amLODipine (NORVASC) 10 MG tablet, Take 1 tablet (10 mg total) by mouth daily., Disp: 90 tablet, Rfl: 1 .  celecoxib (CELEBREX) 200 MG capsule, Take 1 capsule (200 mg total) by mouth 2 (two) times daily. Additionally, take 400mg  (  2 capsules) by mouth the morning of surgery as soon as you wake up with a small sip of water.  No food., Disp: 60 capsule, Rfl: 0 .  hydrochlorothiazide (MICROZIDE) 12.5 MG capsule, Take 1 capsule (12.5 mg total) by mouth daily., Disp: 90 capsule, Rfl: 0 .  traMADol (ULTRAM) 50 MG tablet, Take 50 mg by mouth every 8 (eight) hours as needed for moderate pain., Disp: , Rfl:   IMPRESSION:    ICD-10-CM   1. Pre-operative clearance  Z01.818 EKG 12-Lead    PCV ECHOCARDIOGRAM COMPLETE    PCV MYOCARDIAL PERFUSION WITH LEXISCAN    PCV ECHOCARDIOGRAM COMPLETE  2. Dyspnea on exertion  R06.00 Lipid Panel With LDL/HDL Ratio    PCV ECHOCARDIOGRAM  COMPLETE    PCV MYOCARDIAL PERFUSION WITH LEXISCAN    hydrochlorothiazide (MICROZIDE) 12.5 MG capsule  3. Class 2 obesity due to excess calories without serious comorbidity with body mass index (BMI) of 37.0 to 37.9 in adult  E66.09    Z68.37   4. RBBB  I45.10 PCV ECHOCARDIOGRAM COMPLETE    PCV MYOCARDIAL PERFUSION WITH LEXISCAN    PCV ECHOCARDIOGRAM COMPLETE    Lipid Panel With LDL/HDL Ratio  5. Essential hypertension  I10 amLODipine (NORVASC) 10 MG tablet    hydrochlorothiazide (MICROZIDE) 12.5 MG capsule    Basic Metabolic Panel (BMET)    Magnesium    Magnesium    Basic Metabolic Panel (BMET)     RECOMMENDATIONS: Preoperative risk stratification:  EKG shows normal sinus rhythm with right bundle branch block and left atrial enlargement.  Patient currently does not have any active chest pain at rest or with effort related activities.  However, he does have shortness of breath with effort related activities and his overall functional status is unknown.  Prior to providing preoperative risk stratification would recommend an ischemic evaluation given the patient's symptoms, cardiovascular risk factors, and EKG findings.   Patient is educated on the importance of better blood pressure management  Plan echo.  Plan nuclear stress test to rule out underlying ischemia.  Is educated on keeping a blood pressure log and to bring it in at the next office visit.  Dyspnea on exertion: See above  Benign essential hypertension: Uncontrolled.  We will increase Norvasc to 10 mg p.o. every afternoon.  We will start hydrochlorothiazide 12.5 mg p.o. every morning.  Lab work in 1 week to follow to check kidney function and electrolytes  Patient is educated on keeping a log of his blood pressure and to bring it in at the next office visit.  Right bundle branch block: See above.  Class II obesity, Body mass index is 37.28 kg/m. Educated on importance of risk factor modifications.  Dietary  recommendations provided.  Orders Placed This Encounter  Procedures  . Lipid Panel With LDL/HDL Ratio  . Basic Metabolic Panel (BMET)  . Magnesium  . PCV MYOCARDIAL PERFUSION WITH LEXISCAN  . EKG 12-Lead  . PCV ECHOCARDIOGRAM COMPLETE   --Continue cardiac medications as reconciled in final medication list. --Return in about 3 months (around 06/21/2019) for Discussion of test results.. Or sooner if needed. --Continue follow-up with your primary care physician regarding the management of your other chronic comorbid conditions.  Patient's questions and concerns were addressed to his satisfaction. He voices understanding of the instructions provided during this encounter.   This note was created using a voice recognition software as a result there may be grammatical errors inadvertently enclosed that do not reflect the nature of this encounter.  Every attempt is made to correct such errors.  Rex Kras, DO, [facility]. [place] Office: [phone]

## 2019-03-24 NOTE — Patient Instructions (Signed)
Please remember to bring in your medication bottles in at the next visit.   New Medications that were added at today's visit:  Increased Norvasc to 10mg  po qPM New is HCTZ 12.5mg  po qAM  Medications that were discontinued at today's visit: None  Office will call you to have the following tests scheduled:  Echo and Stress test  Please get labs done on 03/30/2019 at the nearest [place] a log of your blood pressures and bring the log in to the next visit.   Recommend follow up with your PCP as scheduled.

## 2019-03-24 NOTE — Telephone Encounter (Signed)
Appointment made with Dr. Kela Millin at [place] with [facility] at [facility].  Office visit notes requested. Faxing to [phone].  Patient has a been notified and is headed to the appointment now.  [name] NP office has been notified of appointment- Spoke with Solmon Ice.

## 2019-03-24 NOTE — Telephone Encounter (Signed)
Joe Stewart withDr. Marlou Sa surgery scheduled called to follow up on this request Need clearance  / pt needs Cardia clearance please advise

## 2019-03-24 NOTE — Telephone Encounter (Signed)
FYIJeneen Rinks with Short Stay at [facility] called and left message on voicemail yesterday (while our office was closed due to inclemate weather) regarding patient's pre-admission testing yesterday. Patient is scheduled for Left total knee on Tuesday Feb 23rd with Dr Marlou Sa. Patient had an abnormal EKG, possible ischemia with no comparison and no history.  Patient has recently established care with [name], RN. Patient will  need Cardiac Clearance to more forward with this procedure.      Today:  I have called patient and left message on patient's voice mail to return call so we can discuss cardiac clearance.

## 2019-03-24 NOTE — Telephone Encounter (Signed)
Patient needs a office visit for surgical clearance.

## 2019-03-25 ENCOUNTER — Other Ambulatory Visit (HOSPITAL_COMMUNITY)
Admission: RE | Admit: 2019-03-25 | Discharge: 2019-03-25 | Disposition: A | Discharge status: Home / Self Care | Payer: 59 | Source: Ambulatory Visit | Attending: Orthopedic Surgery | Admitting: Orthopedic Surgery

## 2019-03-25 DIAGNOSIS — Z01812 Encounter for preprocedural laboratory examination: Secondary | ICD-10-CM | POA: Insufficient documentation

## 2019-03-25 DIAGNOSIS — Z20822 Contact with and (suspected) exposure to covid-19: Secondary | ICD-10-CM | POA: Insufficient documentation

## 2019-03-25 LAB — SARS CORONAVIRUS 2 (TAT 6-24 HRS): SARS Coronavirus 2: NEGATIVE

## 2019-03-26 ENCOUNTER — Other Ambulatory Visit: Payer: Self-pay | Admitting: Surgical

## 2019-03-26 MED ORDER — CELECOXIB 200 MG PO CAPS: 200.0000 mg | ORAL_CAPSULE | Freq: Two times a day (BID) | ORAL | 0 refills | Status: DC

## 2019-03-27 ENCOUNTER — Encounter: Payer: Self-pay | Admitting: Cardiology

## 2019-03-27 ENCOUNTER — Telehealth: Payer: Self-pay

## 2019-03-27 ENCOUNTER — Ambulatory Visit: Payer: 59 | Admitting: Registered Nurse

## 2019-03-27 ENCOUNTER — Other Ambulatory Visit: Payer: Self-pay

## 2019-03-27 ENCOUNTER — Ambulatory Visit: Payer: 59

## 2019-03-27 DIAGNOSIS — R9431 Abnormal electrocardiogram [ECG] [EKG]: Secondary | ICD-10-CM

## 2019-03-27 DIAGNOSIS — Z01818 Encounter for other preprocedural examination: Secondary | ICD-10-CM

## 2019-03-28 ENCOUNTER — Encounter (HOSPITAL_COMMUNITY): Admission: RE | Payer: Self-pay | Source: Home / Self Care

## 2019-03-28 ENCOUNTER — Ambulatory Visit (HOSPITAL_COMMUNITY): Admission: RE | Admit: 2019-03-28 | Payer: 59 | Source: Home / Self Care | Admitting: Orthopedic Surgery

## 2019-03-28 SURGERY — ARTHROPLASTY, KNEE, TOTAL
Anesthesia: Spinal | Site: Knee | Laterality: Left

## 2019-03-28 NOTE — Telephone Encounter (Signed)
Please call the patient and bring him in on Friday. He is anticipating a phone call from front test. Reason abnormal stress test.

## 2019-03-29 ENCOUNTER — Other Ambulatory Visit: Payer: Self-pay | Admitting: Cardiology

## 2019-03-29 MED ORDER — METOPROLOL SUCCINATE ER 25 MG PO TB24: 25.0000 mg | ORAL_TABLET | Freq: Every day | ORAL | 3 refills | Status: DC

## 2019-03-29 NOTE — Progress Notes (Signed)
We will start the patient on metoprolol succinate 25 mg p.o. daily with holding parameters as discussed over the phone.  Patient has an abnormal nuclear stress test for which she will be coming in to discuss it further in details.  In the meantime, patient states that his blood pressure is improving.  Also, patient is educated on seeking medical attention sooner at the closest ER via EMS if his symptoms worsen in intensity, frequency, duration or has typical chest pain as discussed over the phone.  Patient verbalized understanding.  Rex Kras, DO, [facility]. [place] Office: [phone]

## 2019-03-29 NOTE — Telephone Encounter (Signed)
Thanks

## 2019-03-31 ENCOUNTER — Other Ambulatory Visit: Payer: Self-pay

## 2019-03-31 ENCOUNTER — Encounter: Payer: Self-pay | Admitting: Cardiology

## 2019-03-31 ENCOUNTER — Ambulatory Visit: Payer: 59 | Admitting: Cardiology

## 2019-03-31 VITALS — BP 150/84 | HR 89 | Temp 97.9°F | Ht 68.0 in | Wt 238.0 lb

## 2019-03-31 DIAGNOSIS — R9439 Abnormal result of other cardiovascular function study: Secondary | ICD-10-CM

## 2019-03-31 DIAGNOSIS — E66812 Obesity, class 2: Secondary | ICD-10-CM

## 2019-03-31 DIAGNOSIS — R0609 Other forms of dyspnea: Secondary | ICD-10-CM

## 2019-03-31 DIAGNOSIS — E6609 Other obesity due to excess calories: Secondary | ICD-10-CM

## 2019-03-31 DIAGNOSIS — Z01818 Encounter for other preprocedural examination: Secondary | ICD-10-CM

## 2019-03-31 DIAGNOSIS — E782 Mixed hyperlipidemia: Secondary | ICD-10-CM

## 2019-03-31 DIAGNOSIS — I4510 Unspecified right bundle-branch block: Secondary | ICD-10-CM

## 2019-03-31 DIAGNOSIS — I10 Essential (primary) hypertension: Secondary | ICD-10-CM

## 2019-03-31 LAB — BASIC METABOLIC PANEL
BUN/Creatinine Ratio: 22 (ref 10–24)
BUN: 24 mg/dL (ref 8–27)
CO2: 22 mmol/L (ref 20–29)
Calcium: 10.6 mg/dL — ABNORMAL HIGH (ref 8.6–10.2)
Chloride: 96 mmol/L (ref 96–106)
Creatinine, Ser: 1.09 mg/dL (ref 0.76–1.27)
GFR calc Af Amer: 82 mL/min/{1.73_m2} (ref >59)
GFR calc non Af Amer: 71 mL/min/{1.73_m2} (ref >59)
Glucose: 217 mg/dL — ABNORMAL HIGH (ref 65–99)
Potassium: 4.8 mmol/L (ref 3.5–5.2)
Sodium: 138 mmol/L (ref 134–144)

## 2019-03-31 LAB — MAGNESIUM: Magnesium: 1.9 mg/dL (ref 1.6–2.3)

## 2019-03-31 LAB — LIPID PANEL WITH LDL/HDL RATIO
Cholesterol, Total: 211 mg/dL — ABNORMAL HIGH (ref 100–199)
HDL: 41 mg/dL (ref >39)
LDL Chol Calc (NIH): 140 mg/dL — ABNORMAL HIGH (ref 0–99)
LDL/HDL Ratio: 3.4 ratio (ref 0.0–3.6)
Triglycerides: 167 mg/dL — ABNORMAL HIGH (ref 0–149)
VLDL Cholesterol Cal: 30 mg/dL (ref 5–40)

## 2019-03-31 MED ORDER — HYDROCHLOROTHIAZIDE 12.5 MG PO CAPS: 25.0000 mg | ORAL_CAPSULE | Freq: Every day | ORAL | 0 refills | Status: DC

## 2019-03-31 MED ORDER — ATORVASTATIN CALCIUM 40 MG PO TABS: 40.0000 mg | ORAL_TABLET | Freq: Every day | ORAL | 3 refills | Status: DC

## 2019-03-31 NOTE — Patient Instructions (Signed)
Please remember to bring in your medication bottles in at the next visit.   Medications that were increased at today's visit:  HCTZ to 25mg  po every morning.  Lipitor 40mg  po at night   Medications that were discontinued at today's visit: None  Office will call you to have the following tests scheduled:  Heart catheterization.  Please get labs done in about 6 week at the nearest [place].  Recommend follow up with your PCP as scheduled.

## 2019-04-02 NOTE — Progress Notes (Signed)
Chief Complaint  Patient presents with  . Hypertension  . Follow-up  . Results    REQUESTING PHYSICIAN:  [name], NP [place],  [place] [id number]  HPI  Joe Stewart is a 64 y.o. male who presents to the office with a chief complaint of " test results." Patient's past medical history and cardiac risk factors include: Hypertension, obesity, osteoarthritis.  Patient is referred to the office at the request of Dr. Marlou Sa of [facility] for preoperative or stratification prior to her upcoming left knee replacement currently scheduled for March 28, 2019.  Patient went for preoperative lab work and testing and he was informed that his EKG is abnormal therefore cardiology consultation was requested.  Hypertension: During the initial office visit for preoperative risk stratification patient was noted to have hypertensive urgency.  Patient is initial blood pressure was 216/105 with a heart rate of 83 bpm on repeat evaluation and systolic blood pressure was 180/95.  At the last visit his Norvasc was increased to 10 mg p.o. daily and was started on hydrochlorothiazide 12.5 mg p.o. every morning.  In the interim patient had an abnormal nuclear stress test for which she was started on beta-blocker therapy as well.  Patient states that since the addition of beta-blockers in the last office visit his systolic blood pressures range between Q000111Q mmHg and diastolic blood pressures range between 72-84 mmHg.  Despite improvement in his blood pressures patient states that his shortness of breath is only improved by no more than 20%.  He still gets unrelated dyspnea with walking from the office to his car.  He denies any active chest pain or episodes of typical angina.  Abnormal nuclear stress test: Patient was informed that during his preoperative risk stratification he was recommended to undergo a nuclear stress test given his uncontrolled hypertension, dyspnea on exertion, and  various cardiovascular risk factors.  The nuclear stress test was reported to be abnormal findings noted below for further reference.  The results were reviewed in great details with the patient at today's office visit.  Beta-blockers were added to his medical regimen after knowing the results of the stress test and has been tolerating the medication well without any side effects or intolerances.  Review of systems positive for: shortness of breath effort related symptoms. Currently patient denies chest pain,  lightheadedness, dizziness, palpitations, orthopnea, paroxysmal nocturnal dyspnea, lower extremity swelling, near syncope, syncopal events, hematochezia, hemoptysis, hematemesis, melanotic stools, no symptoms of amaurosis fugax, motor or sensory symptoms or dysphasia in the last 6 months.   FUNCTIONAL STATUS: No structured routine or exercise program.    ALLERGIES: No Known Allergies  MEDICATION LIST PRIOR TO VISIT: Current Outpatient Medications on File Prior to Visit  Medication Sig Dispense Refill  . acetaminophen (TYLENOL) 500 MG tablet Take 500 mg by mouth every 6 (six) hours as needed.    Marland Kitchen amLODipine (NORVASC) 10 MG tablet Take 1 tablet (10 mg total) by mouth daily. 90 tablet 1  . celecoxib (CELEBREX) 200 MG capsule Take 1 capsule (200 mg total) by mouth 2 (two) times daily. Additionally, take 400mg  (2 capsules) by mouth the morning of surgery as soon as you wake up with a small sip of water.  No food. 60 capsule 0  . metoprolol succinate (TOPROL-XL) 25 MG 24 hr tablet Take 1 tablet (25 mg total) by mouth daily. Take with or immediately following a meal. Hold if systolic blood pressure (top blood pressure number) less than 100 mmHg or  heart rate less than 60 bpm (pulse). 90 tablet 3   No current facility-administered medications on file prior to visit.    PAST MEDICAL HISTORY: Past Medical History:  Diagnosis Date  . Arthritis   . Hypertension   . Obesity     PAST SURGICAL  HISTORY: Past Surgical History:  Procedure Laterality Date  . HERNIA REPAIR    . HERNIA REPAIR     35 years ago    FAMILY HISTORY: The patient family history includes COPD in his mother and sister; Cancer in his mother; Diabetes in his mother; Healthy in his brother and son; Heart disease in his brother and father.   SOCIAL HISTORY:  The patient  reports that he has never smoked. He has never used smokeless tobacco. He reports previous alcohol use. He reports that he does not use drugs.  14 ORGAN REVIEW OF SYSTEMS: CONSTITUTIONAL: No fever or significant weight loss EYES: No recent significant visual change EARS, NOSE, MOUTH, THROAT: No recent significant change in hearing CARDIOVASCULAR: See discussion in subjective/HPI RESPIRATORY: See discussion in subjective/HPI GASTROINTESTINAL: No recent complaints of abdominal pain GENITOURINARY: No recent significant change in genitourinary status MUSCULOSKELETAL: No recent significant change in musculoskeletal status INTEGUMENTARY: No recent rash NEUROLOGIC: No recent significant change in motor function PSYCHIATRIC: No recent significant change in mood ENDOCRINOLOGIC: No recent significant change in endocrine status HEMATOLOGIC/LYMPHATIC: No recent significant unexpected bruising ALLERGIC/IMMUNOLOGIC: No recent unexplained allergic reaction  PHYSICAL EXAM: Vitals with BMI 03/31/2019 03/24/2019 03/22/2019  Height 5\' 8"  5\' 8"  5\' 8"   Weight 238 lbs 245 lbs 3 oz 244 lbs  BMI 36.2 0000000 [id number]  Systolic Q000111Q [id number] Q000111Q  Diastolic 84 95 89  Pulse 89 93 90   CONSTITUTIONAL: Well-developed and well-nourished. No acute distress.  SKIN: Skin is warm and dry. No rash noted. No cyanosis. No pallor. No jaundice HEAD: Normocephalic and atraumatic.  EYES: No scleral icterus MOUTH/THROAT: Moist oral membranes.  NECK: No JVD present. No thyromegaly noted. No carotid bruits  LYMPHATIC: No visible cervical adenopathy.  CHEST Normal respiratory effort. No  intercostal retractions  LUNGS: Clear to auscultations bilaterally.  No stridor. No wheezes. No rales.  CARDIOVASCULAR: Regular rate and rhythm, positive S1-S2, no murmurs rubs or gallops appreciated. ABDOMINAL: Obese, soft, nontender, nondistended, positive bowel sounds all 4 quadrants.  No apparent ascites.  EXTREMITIES: No peripheral edema  HEMATOLOGIC: No significant bruising NEUROLOGIC: Oriented to person, place, and time. Nonfocal. Normal muscle tone.  PSYCHIATRIC: Normal mood and affect. Normal behavior. Cooperative  CARDIAC DATABASE: EKG: 03/24/2019: Normal sinus rhythm, ventricular rate 93 bpm, right bundle branch block, left atrial enlargement, ST-T changes most likely secondary to underlying right bundle branch block.  No apparent underlying ischemia or injury pattern.  Echocardiogram: 03/27/2019:  Normal LV systolic function with visual EF 50-55%. Left ventricle cavity is normal in size. Normal global wall motion. Normal diastolic filling pattern, normal LAP. Mild left ventricular hypertrophy.Aortic sclerosis without stenosis.   Stress Testing:  Lexiscan Tetrofosmin Stress Test  03/27/2019: Nondiagnostic ECG stress. Hypertensive at rest. Normal BP response to Lexiscan infusion.  Myocardial perfusion is abnormal. There is a partially reversible moderate sized mild ischemia in the inferior and inferolateral region.  Stress LV EF: 61%.  Mild diaphragmatic attenuation noted.  Intermediate risk study. No previous exam available for comparison.  Heart Catheterization: None   LABORATORY DATA: CBC Latest Ref Rng & Units 03/22/2019  WBC 4.0 - 10.5 K/uL 7.1  Hemoglobin 13.0 - 17.0 g/dL 13.8  Hematocrit 39.0 -  52.0 % 43.1  Platelets 150 - 400 K/uL 254    CMP Latest Ref Rng & Units 03/30/2019 03/22/2019  Glucose 65 - 99 mg/dL 217(H) 185(H)  BUN 8 - 27 mg/dL 24 20  Creatinine 0.76 - 1.27 mg/dL 1.09 0.92  Sodium 134 - 144 mmol/L 138 139  Potassium 3.5 - 5.2 mmol/L 4.8 4.6    Chloride 96 - 106 mmol/L 96 103  CO2 20 - 29 mmol/L 22 23  Calcium 8.6 - 10.2 mg/dL 10.6(H) 9.5    Lipid Panel     Component Value Date/Time   CHOL 211 (H) 03/30/2019 0810   TRIG 167 (H) 03/30/2019 0810   HDL 41 03/30/2019 0810   LDLCALC 140 (H) 03/30/2019 0810   LABVLDL 30 03/30/2019 0810    No results found for: HGBA1C No components found for: NTPROBNP No results found for: TSH  FINAL MEDICATION LIST END OF ENCOUNTER: Meds ordered this encounter  Medications  . atorvastatin (LIPITOR) 40 MG tablet    Sig: Take 1 tablet (40 mg total) by mouth daily.    Dispense:  90 tablet    Refill:  3  . hydrochlorothiazide (MICROZIDE) 12.5 MG capsule    Sig: Take 2 capsules (25 mg total) by mouth daily.    Dispense:  180 capsule    Refill:  0    Medications Discontinued During This Encounter  Medication Reason  . traMADol (ULTRAM) 50 MG tablet Error  . hydrochlorothiazide (MICROZIDE) 12.5 MG capsule      Current Outpatient Medications:  .  acetaminophen (TYLENOL) 500 MG tablet, Take 500 mg by mouth every 6 (six) hours as needed., Disp: , Rfl:  .  amLODipine (NORVASC) 10 MG tablet, Take 1 tablet (10 mg total) by mouth daily., Disp: 90 tablet, Rfl: 1 .  celecoxib (CELEBREX) 200 MG capsule, Take 1 capsule (200 mg total) by mouth 2 (two) times daily. Additionally, take 400mg  (2 capsules) by mouth the morning of surgery as soon as you wake up with a small sip of water.  No food., Disp: 60 capsule, Rfl: 0 .  hydrochlorothiazide (MICROZIDE) 12.5 MG capsule, Take 2 capsules (25 mg total) by mouth daily., Disp: 180 capsule, Rfl: 0 .  metoprolol succinate (TOPROL-XL) 25 MG 24 hr tablet, Take 1 tablet (25 mg total) by mouth daily. Take with or immediately following a meal. Hold if systolic blood pressure (top blood pressure number) less than 100 mmHg or heart rate less than 60 bpm (pulse)., Disp: 90 tablet, Rfl: 3 .  atorvastatin (LIPITOR) 40 MG tablet, Take 1 tablet (40 mg total) by mouth  daily., Disp: 90 tablet, Rfl: 3  IMPRESSION:    ICD-10-CM   1. Dyspnea on exertion  R06.00 hydrochlorothiazide (MICROZIDE) 12.5 MG capsule  2. Abnormal nuclear stress test  R94.39   3. Mixed hyperlipidemia  E78.2 atorvastatin (LIPITOR) 40 MG tablet    Lipid Panel With LDL/HDL Ratio    Lipid Panel With LDL/HDL Ratio  4. RBBB  I45.10   5. Essential hypertension  I10 hydrochlorothiazide (MICROZIDE) 12.5 MG capsule  6. Class 2 obesity due to excess calories without serious comorbidity with body mass index (BMI) of 37.0 to 37.9 in adult  E66.09    Z68.37   7. Pre-operative clearance  Z01.818      RECOMMENDATIONS: Abnormal nuclear stress test:  Reviewed the nuclear stress test results with the patient in great detail.  The left heart catheterization procedure was explained to the patient in detail. The indication, alternatives, risks  and benefits were reviewed. Complications including but not limited to bleeding, infection, acute kidney injury, blood transfusion, heart rhythm disturbances, contrast (dye) reaction, damage to the arteries or nerves in the legs or hands, cerebrovascular accident, myocardial infarction, need for emergent bypass surgery, blood clots in the legs, possible need for emergent blood transfusion, and rarely death were reviewed and discussed with the patient. The patient voices understanding and wishes to proceed.   Check Covid screen prior to left heart catheterization.  Hyperlipidemia: Newly diagnosed/uncontrolled  Patient's estimated 10-year risk of ASCVD is approximately 22% therefore maximally tolerated statin initiation is not recommended for high risk patients.  Start Lipitor 40 mg p.o. nightly.  Check fasting lipid profile in 6 weeks  Preoperative risk stratification:  EKG shows normal sinus rhythm with right bundle branch block and left atrial enlargement.  Patient currently does not have any active chest pain at rest or with effort related activities.   However, he does have shortness of breath with effort related activities and his overall functional status is unknown.  Preoperative risk stratification forthcoming.   Dyspnea on exertion: See above  Benign essential hypertension: Improving.  Continue Norvasc to 10 mg p.o. every afternoon.  Continue beta-blocker therapy  Blood work reviewed since initiation of diuretic therapy  Increase hydrochlorothiazide to 25 mg p.o. every morning  Right bundle branch block: See above.  Class II obesity, Body mass index is 36.19 kg/m.  Educated on importance of risk factor modifications.  Dietary recommendations provided.  Orders Placed This Encounter  Procedures  . Lipid Panel With LDL/HDL Ratio   --Continue cardiac medications as reconciled in final medication list. --Return post Heart catheterization... Or sooner if needed. --Continue follow-up with your primary care physician regarding the management of your other chronic comorbid conditions.  Patient's questions and concerns were addressed to his satisfaction. He voices understanding of the instructions provided during this encounter.   This note was created using a voice recognition software as a result there may be grammatical errors inadvertently enclosed that do not reflect the nature of this encounter. Every attempt is made to correct such errors.  Rex Kras, DO, [facility]. [place] Office: [phone]

## 2019-04-02 NOTE — H&P (View-Only) (Signed)
Chief Complaint  Patient presents with  . Hypertension  . Follow-up  . Results    REQUESTING PHYSICIAN:  [name], NP [place],  [place] [id number]  HPI  Joe Stewart is a 64 y.o. male who presents to the office with a chief complaint of " test results." Patient's past medical history and cardiac risk factors include: Hypertension, obesity, osteoarthritis.  Patient is referred to the office at the request of Dr. Marlou Sa of [facility] for preoperative or stratification prior to her upcoming left knee replacement currently scheduled for March 28, 2019.  Patient went for preoperative lab work and testing and he was informed that his EKG is abnormal therefore cardiology consultation was requested.  Hypertension: During the initial office visit for preoperative risk stratification patient was noted to have hypertensive urgency.  Patient is initial blood pressure was 216/105 with a heart rate of 83 bpm on repeat evaluation and systolic blood pressure was 180/95.  At the last visit his Norvasc was increased to 10 mg p.o. daily and was started on hydrochlorothiazide 12.5 mg p.o. every morning.  In the interim patient had an abnormal nuclear stress test for which she was started on beta-blocker therapy as well.  Patient states that since the addition of beta-blockers in the last office visit his systolic blood pressures range between Q000111Q mmHg and diastolic blood pressures range between 72-84 mmHg.  Despite improvement in his blood pressures patient states that his shortness of breath is only improved by no more than 20%.  He still gets unrelated dyspnea with walking from the office to his car.  He denies any active chest pain or episodes of typical angina.  Abnormal nuclear stress test: Patient was informed that during his preoperative risk stratification he was recommended to undergo a nuclear stress test given his uncontrolled hypertension, dyspnea on exertion, and  various cardiovascular risk factors.  The nuclear stress test was reported to be abnormal findings noted below for further reference.  The results were reviewed in great details with the patient at today's office visit.  Beta-blockers were added to his medical regimen after knowing the results of the stress test and has been tolerating the medication well without any side effects or intolerances.  Review of systems positive for: shortness of breath effort related symptoms. Currently patient denies chest pain,  lightheadedness, dizziness, palpitations, orthopnea, paroxysmal nocturnal dyspnea, lower extremity swelling, near syncope, syncopal events, hematochezia, hemoptysis, hematemesis, melanotic stools, no symptoms of amaurosis fugax, motor or sensory symptoms or dysphasia in the last 6 months.   FUNCTIONAL STATUS: No structured routine or exercise program.    ALLERGIES: No Known Allergies  MEDICATION LIST PRIOR TO VISIT: Current Outpatient Medications on File Prior to Visit  Medication Sig Dispense Refill  . acetaminophen (TYLENOL) 500 MG tablet Take 500 mg by mouth every 6 (six) hours as needed.    Marland Kitchen amLODipine (NORVASC) 10 MG tablet Take 1 tablet (10 mg total) by mouth daily. 90 tablet 1  . celecoxib (CELEBREX) 200 MG capsule Take 1 capsule (200 mg total) by mouth 2 (two) times daily. Additionally, take 400mg  (2 capsules) by mouth the morning of surgery as soon as you wake up with a small sip of water.  No food. 60 capsule 0  . metoprolol succinate (TOPROL-XL) 25 MG 24 hr tablet Take 1 tablet (25 mg total) by mouth daily. Take with or immediately following a meal. Hold if systolic blood pressure (top blood pressure number) less than 100 mmHg or  heart rate less than 60 bpm (pulse). 90 tablet 3   No current facility-administered medications on file prior to visit.    PAST MEDICAL HISTORY: Past Medical History:  Diagnosis Date  . Arthritis   . Hypertension   . Obesity     PAST SURGICAL  HISTORY: Past Surgical History:  Procedure Laterality Date  . HERNIA REPAIR    . HERNIA REPAIR     35 years ago    FAMILY HISTORY: The patient family history includes COPD in his mother and sister; Cancer in his mother; Diabetes in his mother; Healthy in his brother and son; Heart disease in his brother and father.   SOCIAL HISTORY:  The patient  reports that he has never smoked. He has never used smokeless tobacco. He reports previous alcohol use. He reports that he does not use drugs.  14 ORGAN REVIEW OF SYSTEMS: CONSTITUTIONAL: No fever or significant weight loss EYES: No recent significant visual change EARS, NOSE, MOUTH, THROAT: No recent significant change in hearing CARDIOVASCULAR: See discussion in subjective/HPI RESPIRATORY: See discussion in subjective/HPI GASTROINTESTINAL: No recent complaints of abdominal pain GENITOURINARY: No recent significant change in genitourinary status MUSCULOSKELETAL: No recent significant change in musculoskeletal status INTEGUMENTARY: No recent rash NEUROLOGIC: No recent significant change in motor function PSYCHIATRIC: No recent significant change in mood ENDOCRINOLOGIC: No recent significant change in endocrine status HEMATOLOGIC/LYMPHATIC: No recent significant unexpected bruising ALLERGIC/IMMUNOLOGIC: No recent unexplained allergic reaction  PHYSICAL EXAM: Vitals with BMI 03/31/2019 03/24/2019 03/22/2019  Height 5\' 8"  5\' 8"  5\' 8"   Weight 238 lbs 245 lbs 3 oz 244 lbs  BMI 36.2 0000000 [id number]  Systolic Q000111Q [id number] Q000111Q  Diastolic 84 95 89  Pulse 89 93 90   CONSTITUTIONAL: Well-developed and well-nourished. No acute distress.  SKIN: Skin is warm and dry. No rash noted. No cyanosis. No pallor. No jaundice HEAD: Normocephalic and atraumatic.  EYES: No scleral icterus MOUTH/THROAT: Moist oral membranes.  NECK: No JVD present. No thyromegaly noted. No carotid bruits  LYMPHATIC: No visible cervical adenopathy.  CHEST Normal respiratory effort. No  intercostal retractions  LUNGS: Clear to auscultations bilaterally.  No stridor. No wheezes. No rales.  CARDIOVASCULAR: Regular rate and rhythm, positive S1-S2, no murmurs rubs or gallops appreciated. ABDOMINAL: Obese, soft, nontender, nondistended, positive bowel sounds all 4 quadrants.  No apparent ascites.  EXTREMITIES: No peripheral edema  HEMATOLOGIC: No significant bruising NEUROLOGIC: Oriented to person, place, and time. Nonfocal. Normal muscle tone.  PSYCHIATRIC: Normal mood and affect. Normal behavior. Cooperative  CARDIAC DATABASE: EKG: 03/24/2019: Normal sinus rhythm, ventricular rate 93 bpm, right bundle branch block, left atrial enlargement, ST-T changes most likely secondary to underlying right bundle branch block.  No apparent underlying ischemia or injury pattern.  Echocardiogram: 03/27/2019:  Normal LV systolic function with visual EF 50-55%. Left ventricle cavity is normal in size. Normal global wall motion. Normal diastolic filling pattern, normal LAP. Mild left ventricular hypertrophy.Aortic sclerosis without stenosis.   Stress Testing:  Lexiscan Tetrofosmin Stress Test  03/27/2019: Nondiagnostic ECG stress. Hypertensive at rest. Normal BP response to Lexiscan infusion.  Myocardial perfusion is abnormal. There is a partially reversible moderate sized mild ischemia in the inferior and inferolateral region.  Stress LV EF: 61%.  Mild diaphragmatic attenuation noted.  Intermediate risk study. No previous exam available for comparison.  Heart Catheterization: None   LABORATORY DATA: CBC Latest Ref Rng & Units 03/22/2019  WBC 4.0 - 10.5 K/uL 7.1  Hemoglobin 13.0 - 17.0 g/dL 13.8  Hematocrit 39.0 -  52.0 % 43.1  Platelets 150 - 400 K/uL 254    CMP Latest Ref Rng & Units 03/30/2019 03/22/2019  Glucose 65 - 99 mg/dL 217(H) 185(H)  BUN 8 - 27 mg/dL 24 20  Creatinine 0.76 - 1.27 mg/dL 1.09 0.92  Sodium 134 - 144 mmol/L 138 139  Potassium 3.5 - 5.2 mmol/L 4.8 4.6    Chloride 96 - 106 mmol/L 96 103  CO2 20 - 29 mmol/L 22 23  Calcium 8.6 - 10.2 mg/dL 10.6(H) 9.5    Lipid Panel     Component Value Date/Time   CHOL 211 (H) 03/30/2019 0810   TRIG 167 (H) 03/30/2019 0810   HDL 41 03/30/2019 0810   LDLCALC 140 (H) 03/30/2019 0810   LABVLDL 30 03/30/2019 0810    No results found for: HGBA1C No components found for: NTPROBNP No results found for: TSH  FINAL MEDICATION LIST END OF ENCOUNTER: Meds ordered this encounter  Medications  . atorvastatin (LIPITOR) 40 MG tablet    Sig: Take 1 tablet (40 mg total) by mouth daily.    Dispense:  90 tablet    Refill:  3  . hydrochlorothiazide (MICROZIDE) 12.5 MG capsule    Sig: Take 2 capsules (25 mg total) by mouth daily.    Dispense:  180 capsule    Refill:  0    Medications Discontinued During This Encounter  Medication Reason  . traMADol (ULTRAM) 50 MG tablet Error  . hydrochlorothiazide (MICROZIDE) 12.5 MG capsule      Current Outpatient Medications:  .  acetaminophen (TYLENOL) 500 MG tablet, Take 500 mg by mouth every 6 (six) hours as needed., Disp: , Rfl:  .  amLODipine (NORVASC) 10 MG tablet, Take 1 tablet (10 mg total) by mouth daily., Disp: 90 tablet, Rfl: 1 .  celecoxib (CELEBREX) 200 MG capsule, Take 1 capsule (200 mg total) by mouth 2 (two) times daily. Additionally, take 400mg  (2 capsules) by mouth the morning of surgery as soon as you wake up with a small sip of water.  No food., Disp: 60 capsule, Rfl: 0 .  hydrochlorothiazide (MICROZIDE) 12.5 MG capsule, Take 2 capsules (25 mg total) by mouth daily., Disp: 180 capsule, Rfl: 0 .  metoprolol succinate (TOPROL-XL) 25 MG 24 hr tablet, Take 1 tablet (25 mg total) by mouth daily. Take with or immediately following a meal. Hold if systolic blood pressure (top blood pressure number) less than 100 mmHg or heart rate less than 60 bpm (pulse)., Disp: 90 tablet, Rfl: 3 .  atorvastatin (LIPITOR) 40 MG tablet, Take 1 tablet (40 mg total) by mouth  daily., Disp: 90 tablet, Rfl: 3  IMPRESSION:    ICD-10-CM   1. Dyspnea on exertion  R06.00 hydrochlorothiazide (MICROZIDE) 12.5 MG capsule  2. Abnormal nuclear stress test  R94.39   3. Mixed hyperlipidemia  E78.2 atorvastatin (LIPITOR) 40 MG tablet    Lipid Panel With LDL/HDL Ratio    Lipid Panel With LDL/HDL Ratio  4. RBBB  I45.10   5. Essential hypertension  I10 hydrochlorothiazide (MICROZIDE) 12.5 MG capsule  6. Class 2 obesity due to excess calories without serious comorbidity with body mass index (BMI) of 37.0 to 37.9 in adult  E66.09    Z68.37   7. Pre-operative clearance  Z01.818      RECOMMENDATIONS: Abnormal nuclear stress test:  Reviewed the nuclear stress test results with the patient in great detail.  The left heart catheterization procedure was explained to the patient in detail. The indication, alternatives, risks  and benefits were reviewed. Complications including but not limited to bleeding, infection, acute kidney injury, blood transfusion, heart rhythm disturbances, contrast (dye) reaction, damage to the arteries or nerves in the legs or hands, cerebrovascular accident, myocardial infarction, need for emergent bypass surgery, blood clots in the legs, possible need for emergent blood transfusion, and rarely death were reviewed and discussed with the patient. The patient voices understanding and wishes to proceed.   Check Covid screen prior to left heart catheterization.  Hyperlipidemia: Newly diagnosed/uncontrolled  Patient's estimated 10-year risk of ASCVD is approximately 22% therefore maximally tolerated statin initiation is not recommended for high risk patients.  Start Lipitor 40 mg p.o. nightly.  Check fasting lipid profile in 6 weeks  Preoperative risk stratification:  EKG shows normal sinus rhythm with right bundle branch block and left atrial enlargement.  Patient currently does not have any active chest pain at rest or with effort related activities.   However, he does have shortness of breath with effort related activities and his overall functional status is unknown.  Preoperative risk stratification forthcoming.   Dyspnea on exertion: See above  Benign essential hypertension: Improving.  Continue Norvasc to 10 mg p.o. every afternoon.  Continue beta-blocker therapy  Blood work reviewed since initiation of diuretic therapy  Increase hydrochlorothiazide to 25 mg p.o. every morning  Right bundle branch block: See above.  Class II obesity, Body mass index is 36.19 kg/m.  Educated on importance of risk factor modifications.  Dietary recommendations provided.  Orders Placed This Encounter  Procedures  . Lipid Panel With LDL/HDL Ratio   --Continue cardiac medications as reconciled in final medication list. --Return post Heart catheterization... Or sooner if needed. --Continue follow-up with your primary care physician regarding the management of your other chronic comorbid conditions.  Patient's questions and concerns were addressed to his satisfaction. He voices understanding of the instructions provided during this encounter.   This note was created using a voice recognition software as a result there may be grammatical errors inadvertently enclosed that do not reflect the nature of this encounter. Every attempt is made to correct such errors.  Rex Kras, DO, [facility]. [place] Office: [phone]

## 2019-04-07 ENCOUNTER — Other Ambulatory Visit (HOSPITAL_COMMUNITY)
Admission: RE | Admit: 2019-04-07 | Discharge: 2019-04-07 | Disposition: A | Discharge status: Home / Self Care | Payer: 59 | Source: Ambulatory Visit | Attending: Cardiology | Admitting: Cardiology

## 2019-04-07 DIAGNOSIS — Z01812 Encounter for preprocedural laboratory examination: Secondary | ICD-10-CM | POA: Diagnosis present

## 2019-04-07 DIAGNOSIS — Z20822 Contact with and (suspected) exposure to covid-19: Secondary | ICD-10-CM | POA: Diagnosis not present

## 2019-04-07 LAB — SARS CORONAVIRUS 2 (TAT 6-24 HRS): SARS Coronavirus 2: NEGATIVE

## 2019-04-08 LAB — LIPID PANEL WITH LDL/HDL RATIO
Cholesterol, Total: 146 mg/dL (ref 100–199)
HDL: 37 mg/dL — ABNORMAL LOW (ref >39)
LDL Chol Calc (NIH): 81 mg/dL (ref 0–99)
LDL/HDL Ratio: 2.2 ratio (ref 0.0–3.6)
Triglycerides: 163 mg/dL — ABNORMAL HIGH (ref 0–149)
VLDL Cholesterol Cal: 28 mg/dL (ref 5–40)

## 2019-04-10 ENCOUNTER — Telehealth: Payer: Self-pay

## 2019-04-10 DIAGNOSIS — R9439 Abnormal result of other cardiovascular function study: Secondary | ICD-10-CM | POA: Diagnosis present

## 2019-04-10 DIAGNOSIS — R0609 Other forms of dyspnea: Secondary | ICD-10-CM | POA: Diagnosis present

## 2019-04-10 NOTE — Telephone Encounter (Signed)
Surgery had to be cancelled due to patient needing further medical testing and treatment for other issues. It has not been rescheduled yet.

## 2019-04-10 NOTE — Telephone Encounter (Signed)
vm received.Wanting to confirm if pt has knee surgery on 2/23 and what is work status is? [phone]

## 2019-04-11 ENCOUNTER — Encounter (HOSPITAL_COMMUNITY): Payer: Self-pay | Admitting: Cardiology

## 2019-04-11 ENCOUNTER — Ambulatory Visit (HOSPITAL_COMMUNITY)
Admission: RE | Admit: 2019-04-11 | Discharge: 2019-04-11 | Disposition: A | Discharge status: Home / Self Care | Payer: 59 | Attending: Cardiology | Admitting: Cardiology

## 2019-04-11 ENCOUNTER — Encounter (HOSPITAL_COMMUNITY)
Admission: RE | Disposition: A | Discharge status: Home / Self Care | Payer: Self-pay | Source: Home / Self Care | Attending: Cardiology

## 2019-04-11 DIAGNOSIS — R9439 Abnormal result of other cardiovascular function study: Secondary | ICD-10-CM | POA: Diagnosis present

## 2019-04-11 DIAGNOSIS — R0609 Other forms of dyspnea: Secondary | ICD-10-CM | POA: Diagnosis present

## 2019-04-11 DIAGNOSIS — I152 Hypertension secondary to endocrine disorders: Secondary | ICD-10-CM | POA: Diagnosis present

## 2019-04-11 DIAGNOSIS — Z8249 Family history of ischemic heart disease and other diseases of the circulatory system: Secondary | ICD-10-CM | POA: Diagnosis not present

## 2019-04-11 DIAGNOSIS — Z6837 Body mass index (BMI) 37.0-37.9, adult: Secondary | ICD-10-CM | POA: Insufficient documentation

## 2019-04-11 DIAGNOSIS — E782 Mixed hyperlipidemia: Secondary | ICD-10-CM | POA: Diagnosis not present

## 2019-04-11 DIAGNOSIS — I4510 Unspecified right bundle-branch block: Secondary | ICD-10-CM | POA: Diagnosis not present

## 2019-04-11 DIAGNOSIS — I10 Essential (primary) hypertension: Secondary | ICD-10-CM | POA: Diagnosis not present

## 2019-04-11 DIAGNOSIS — E669 Obesity, unspecified: Secondary | ICD-10-CM | POA: Insufficient documentation

## 2019-04-11 DIAGNOSIS — I2510 Atherosclerotic heart disease of native coronary artery without angina pectoris: Secondary | ICD-10-CM | POA: Insufficient documentation

## 2019-04-11 DIAGNOSIS — Z79899 Other long term (current) drug therapy: Secondary | ICD-10-CM | POA: Diagnosis not present

## 2019-04-11 HISTORY — PX: LEFT HEART CATH AND CORONARY ANGIOGRAPHY: CATH118249

## 2019-04-11 HISTORY — PX: RENAL ANGIOGRAPHY: CATH118260

## 2019-04-11 SURGERY — LEFT HEART CATH AND CORONARY ANGIOGRAPHY
Anesthesia: LOCAL

## 2019-04-11 MED ORDER — HEPARIN (PORCINE) IN NACL 1000-0.9 UT/500ML-% IV SOLN
INTRAVENOUS | Status: DC | PRN
  Administered 2019-04-11 (×2): 500 mL

## 2019-04-11 MED ORDER — ASPIRIN 81 MG PO CHEW
81.0000 mg | CHEWABLE_TABLET | ORAL | Status: AC
  Administered 2019-04-11: 81 mg via ORAL
  Filled 2019-04-11: qty 1

## 2019-04-11 MED ORDER — SODIUM CHLORIDE 0.9 % WEIGHT BASED INFUSION: 1.0000 mL/kg/h | INTRAVENOUS | Status: DC

## 2019-04-11 MED ORDER — ACETAMINOPHEN 325 MG PO TABS: 650.0000 mg | ORAL_TABLET | ORAL | Status: DC | PRN

## 2019-04-11 MED ORDER — HEPARIN (PORCINE) IN NACL 1000-0.9 UT/500ML-% IV SOLN
INTRAVENOUS | Status: AC
  Filled 2019-04-11: qty 500

## 2019-04-11 MED ORDER — LIDOCAINE HCL (PF) 1 % IJ SOLN
INTRAMUSCULAR | Status: DC | PRN
  Administered 2019-04-11: 20 mL

## 2019-04-11 MED ORDER — MIDAZOLAM HCL 2 MG/2ML IJ SOLN
INTRAMUSCULAR | Status: AC
  Filled 2019-04-11: qty 2

## 2019-04-11 MED ORDER — SODIUM CHLORIDE 0.9% FLUSH: 3.0000 mL | Freq: Two times a day (BID) | INTRAVENOUS | Status: DC

## 2019-04-11 MED ORDER — SODIUM CHLORIDE 0.9% FLUSH: 3.0000 mL | INTRAVENOUS | Status: DC | PRN

## 2019-04-11 MED ORDER — FENTANYL CITRATE (PF) 100 MCG/2ML IJ SOLN
INTRAMUSCULAR | Status: DC | PRN
  Administered 2019-04-11: 50 ug via INTRAVENOUS

## 2019-04-11 MED ORDER — FENTANYL CITRATE (PF) 100 MCG/2ML IJ SOLN
INTRAMUSCULAR | Status: AC
  Filled 2019-04-11: qty 2

## 2019-04-11 MED ORDER — LIDOCAINE HCL (PF) 1 % IJ SOLN
INTRAMUSCULAR | Status: AC
  Filled 2019-04-11: qty 30

## 2019-04-11 MED ORDER — SODIUM CHLORIDE 0.9 % IV SOLN: 250.0000 mL | INTRAVENOUS | Status: DC | PRN

## 2019-04-11 MED ORDER — HYDRALAZINE HCL 20 MG/ML IJ SOLN: 10.0000 mg | INTRAMUSCULAR | Status: DC | PRN

## 2019-04-11 MED ORDER — MIDAZOLAM HCL 2 MG/2ML IJ SOLN
INTRAMUSCULAR | Status: DC | PRN
  Administered 2019-04-11: 2 mg via INTRAVENOUS

## 2019-04-11 MED ORDER — NITROGLYCERIN 1 MG/10 ML FOR IR/CATH LAB
INTRA_ARTERIAL | Status: AC
  Filled 2019-04-11: qty 10

## 2019-04-11 MED ORDER — IOHEXOL 350 MG/ML SOLN
INTRAVENOUS | Status: DC | PRN
  Administered 2019-04-11: 75 mL

## 2019-04-11 MED ORDER — NITROGLYCERIN 1 MG/10 ML FOR IR/CATH LAB
INTRA_ARTERIAL | Status: DC | PRN
  Administered 2019-04-11: 200 ug via INTRACORONARY

## 2019-04-11 MED ORDER — ONDANSETRON HCL 4 MG/2ML IJ SOLN: 4.0000 mg | Freq: Four times a day (QID) | INTRAMUSCULAR | Status: DC | PRN

## 2019-04-11 MED ORDER — SODIUM CHLORIDE 0.9 % IV SOLN
INTRAVENOUS | Status: AC | PRN
  Administered 2019-04-11: 20 mL/h via INTRAVENOUS

## 2019-04-11 MED ORDER — SODIUM CHLORIDE 0.9 % WEIGHT BASED INFUSION: 3.0000 mL/kg/h | INTRAVENOUS | Status: AC

## 2019-04-11 SURGICAL SUPPLY — 10 items
CATH INFINITI 5FR MPB2 (CATHETERS) ×1 IMPLANT
CLOSURE MYNX CONTROL 5F (Vascular Products) ×1 IMPLANT
KIT HEART LEFT (KITS) ×3 IMPLANT
KIT MICROPUNCTURE NIT STIFF (SHEATH) ×1 IMPLANT
PACK CARDIAC CATHETERIZATION (CUSTOM PROCEDURE TRAY) ×3 IMPLANT
SHEATH PINNACLE 5F 10CM (SHEATH) ×1 IMPLANT
SHEATH PROBE COVER 6X72 (BAG) ×1 IMPLANT
TRANSDUCER W/STOPCOCK (MISCELLANEOUS) ×3 IMPLANT
TUBING CIL FLEX 10 FLL-RA (TUBING) ×3 IMPLANT
WIRE EMERALD 3MM-J .035X150CM (WIRE) ×1 IMPLANT

## 2019-04-11 NOTE — Progress Notes (Signed)
I have reviewed his history and physical, discussed with the patient, he has resident hypertension as well, will proceed with femoral arterial access and also perform abdominal aortogram and possible selective right and left renal arteriogram to exclude renal artery stenosis as well.  Patient is agreeable to this.

## 2019-04-11 NOTE — Interval H&P Note (Signed)
History and Physical Interval Note:  04/11/2019 1:54 PM  Joe Stewart  has presented today for surgery, with the diagnosis of possitive stress test.  The various methods of treatment have been discussed with the patient and family. After consideration of risks, benefits and other options for treatment, the patient has consented to  Procedure(s): LEFT HEART CATH AND CORONARY ANGIOGRAPHY (N/A), aortogram and selective renal arteriogram  and possible angioplasty as a surgical intervention.  The patient's history has been reviewed, patient examined, no change in status, stable for surgery.  I have reviewed the patient's chart and labs.  Questions were answered to the patient's satisfaction.   Symptom Status: Ischemic Symptoms Non-invasive Testing: Intermediate Risk If no or indeterminate stress test, FFR/iFR results in all diseased vessels: N/A Diabetes Mellitus: No S/P CABG: No Antianginal therapy (number of long-acting drugs): >=2 Patient undergoing renal transplant: No Patient undergoing percutaneous valve procedure: No   1 Vessel Disease No proximal LAD involvement, No proximal left dominant LCX involvement  PCI: A (8);  Indication 2  CABG: M (6);  Indication 2 Proximal left dominant LCX involvement  PCI: A (8);  Indication 5  CABG: A (8);  Indication 5 Proximal LAD involvement  PCI: A (8);  Indication 5  CABG: A (8);  Indication 5  2 Vessel Disease No proximal LAD involvement  PCI: A (8);  Indication 8  CABG: A (7);  Indication 8 Proximal LAD involvement  PCI: A (8);  Indication 11  CABG: A (8);  Indication 11  3 Vessel Disease Low disease complexity (e.g., focal stenoses, SYNTAX <=22)  PCI: A (8);  Indication 17  CABG: A (8);  Indication 17 Intermediate or high disease complexity (e.g., SYNTAX >=23)  PCI: M (6);  Indication 21  CABG: A (9);  Indication 21  Left Main Disease Isolated LMCA disease: ostial or midshaft  PCI: A (7);  Indication 24  CABG: A (9);  Indication  24 Isolated LMCA disease: bifurcation involvement  PCI: M (6);  Indication 25  CABG: A (9);  Indication 25 LMCA ostial or midshaft, concurrent low disease burden multivessel disease (e.g., 1-2 additional focal stenoses, SYNTAX <=22)  PCI: A (7);  Indication 26  CABG: A (9);  Indication 26 LMCA ostial or midshaft, concurrent intermediate or high disease burden multivessel disease (e.g., 1-2 additional bifurcation stenoses, long stenoses, SYNTAX >=23)  PCI: M (4);  Indication 27  CABG: A (9);  Indication 27 LMCA bifurcation involvement, concurrent low disease burden multivessel disease (e.g., 1-2 additional focal stenoses, SYNTAX <=22)  PCI: M (6);  Indication 28  CABG: A (9);  Indication 28 LMCA bifurcation involvement, concurrent intermediate or high disease burden multivessel disease (e.g., 1-2 additional bifurcation stenoses, long stenoses, SYNTAX >=23)  PCI: R (3);  Indication 29  CABG: A (9);  Indication 29  Notes:  A indicates appropriate. M indicates may be appropriate. R indicates rarely appropriate. Number in parentheses is median score for that indication. Reclassify indicates number of functionally diseased vessels should be decreased given negative FFR/iFR. Re-evaluate the scenario interpreting any FFR/iFR negative vessel as being not significantly stenosed.  Disease means involved vessel provides flow to a sufficient amount of myocardium to be clinically important.  If FFR testing indicates a vessel is not significant, that vessel should not be considered diseased (and the patient should be reclassified with respect to extent of functionally significant disease).  Proximal LAD + proximal left dominant LCX is considered 3 vessel CAD  2 Vessel CAD with FFR/iFR abnormal  in only 1 but not both is considered 1 vessel CAD  Disease complexity includes occlusion, bifurcation, trifurcation, ostial, >20 mm, tortuosity, calcification, thrombus  LMCA disease is >=50% by angiography, MLD <2.8  mm, MLA <6 mm2; MLA 6-7.5 mm2 requires further physiologic  See Table B for risk stratification based on noninvasive testing  Journal of the [employer] of Cardiology Mar 2017, 23391; DOI: 10.1016/j.jacc.2017.02.001 [employer].de.2017.02.001.full-text.pdf This App  2018 by the Society for Cardiovascular Angiography and Interventions   Adrian Prows

## 2019-04-11 NOTE — Discharge Instructions (Signed)
Femoral Site Care This sheet gives you information about how to care for yourself after your procedure. Your health care provider may also give you more specific instructions. If you have problems or questions, contact your health care provider. What can I expect after the procedure? After the procedure, it is common to have:  Bruising that usually fades within 1-2 weeks.  Tenderness at the site. Follow these instructions at home: Wound care  Follow instructions from your health care provider about how to take care of your insertion site. Make sure you: ? Wash your hands with soap and water before you change your bandage (dressing). If soap and water are not available, use hand sanitizer. ? Change your dressing as told by your health care provider. ? Leave stitches (sutures), skin glue, or adhesive strips in place. These skin closures may need to stay in place for 2 weeks or longer. If adhesive strip edges start to loosen and curl up, you may trim the loose edges. Do not remove adhesive strips completely unless your health care provider tells you to do that.  Do not take baths, swim, or use a hot tub until your health care provider approves.  You may shower 24-48 hours after the procedure or as told by your health care provider. ? Gently wash the site with plain soap and water. ? Pat the area dry with a clean towel. ? Do not rub the site. This may cause bleeding.  Do not apply powder or lotion to the site. Keep the site clean and dry.  Check your femoral site every day for signs of infection. Check for: ? Redness, swelling, or pain. ? Fluid or blood. ? Warmth. ? Pus or a bad smell. Activity  For the first 2-3 days after your procedure, or as long as directed: ? Avoid climbing stairs as much as possible. ? Do not squat.  Do not lift anything that is heavier than 10 lb (4.5 kg), or the limit that you are told, until your health care provider says that it is safe.  Rest as  directed. ? Avoid sitting for a long time without moving. Get up to take short walks every 1-2 hours.  Do not drive for 24 hours if you were given a medicine to help you relax (sedative). General instructions  Take over-the-counter and prescription medicines only as told by your health care provider.  Keep all follow-up visits as told by your health care provider. This is important. Contact a health care provider if you have:  A fever or chills.  You have redness, swelling, or pain around your insertion site. Get help right away if:  The catheter insertion area swells very fast.  You pass out.  You suddenly start to sweat or your skin gets clammy.  The catheter insertion area is bleeding, and the bleeding does not stop when you hold steady pressure on the area.  The area near or just beyond the catheter insertion site becomes pale, cool, tingly, or numb. These symptoms may represent a serious problem that is an emergency. Do not wait to see if the symptoms will go away. Get medical help right away. Call your local emergency services (911 in the [place] Do not drive yourself to the hospital. Summary  After the procedure, it is common to have bruising that usually fades within 1-2 weeks.  Check your femoral site every day for signs of infection.  Do not lift anything that is heavier than 10 lb (4.5 kg), or the   limit that you are told, until your health care provider says that it is safe. This information is not intended to replace advice given to you by your health care provider. Make sure you discuss any questions you have with your health care provider. Document Revised: 02/01/2017 Document Reviewed: 02/01/2017 Elsevier Patient Education  2020 [employer]  

## 2019-04-12 ENCOUNTER — Inpatient Hospital Stay: Payer: 59 | Admitting: Orthopedic Surgery

## 2019-04-12 NOTE — Telephone Encounter (Signed)
Left vm with the info

## 2019-04-15 ENCOUNTER — Other Ambulatory Visit: Payer: Self-pay | Admitting: Registered Nurse

## 2019-04-15 DIAGNOSIS — I10 Essential (primary) hypertension: Secondary | ICD-10-CM

## 2019-04-19 ENCOUNTER — Other Ambulatory Visit: Payer: Self-pay | Admitting: Surgical

## 2019-04-19 NOTE — Telephone Encounter (Signed)
Ok to rf? 

## 2019-04-19 NOTE — Telephone Encounter (Signed)
GD pt

## 2019-04-20 ENCOUNTER — Telehealth: Payer: Self-pay

## 2019-04-20 NOTE — Telephone Encounter (Signed)
Patient has a follow up visit tomorrow. 3/19. Does he need to schedule another appt as well? Please advise. Thanks

## 2019-04-21 ENCOUNTER — Ambulatory Visit: Payer: 59 | Admitting: Cardiology

## 2019-04-21 ENCOUNTER — Other Ambulatory Visit: Payer: Self-pay

## 2019-04-21 ENCOUNTER — Encounter: Payer: Self-pay | Admitting: Cardiology

## 2019-04-21 VITALS — BP 150/82 | HR 78 | Temp 97.4°F | Resp 18 | Ht 68.0 in | Wt 245.6 lb

## 2019-04-21 DIAGNOSIS — E6609 Other obesity due to excess calories: Secondary | ICD-10-CM

## 2019-04-21 DIAGNOSIS — R0609 Other forms of dyspnea: Secondary | ICD-10-CM

## 2019-04-21 DIAGNOSIS — I10 Essential (primary) hypertension: Secondary | ICD-10-CM

## 2019-04-21 DIAGNOSIS — Z01818 Encounter for other preprocedural examination: Secondary | ICD-10-CM

## 2019-04-21 DIAGNOSIS — E782 Mixed hyperlipidemia: Secondary | ICD-10-CM

## 2019-04-21 DIAGNOSIS — I4510 Unspecified right bundle-branch block: Secondary | ICD-10-CM

## 2019-04-21 MED ORDER — AMLODIPINE BESYLATE 10 MG PO TABS: 10.0000 mg | ORAL_TABLET | Freq: Every morning | ORAL | 1 refills | Status: DC

## 2019-04-21 MED ORDER — METOPROLOL SUCCINATE ER 25 MG PO TB24: 25.0000 mg | ORAL_TABLET | Freq: Every day | ORAL | 1 refills | Status: DC

## 2019-04-21 MED ORDER — HYDROCHLOROTHIAZIDE 25 MG PO TABS: 25.0000 mg | ORAL_TABLET | Freq: Every morning | ORAL | 1 refills | Status: DC

## 2019-04-21 MED ORDER — LOSARTAN POTASSIUM 25 MG PO TABS: 25.0000 mg | ORAL_TABLET | Freq: Every evening | ORAL | 3 refills | Status: DC

## 2019-04-21 NOTE — Progress Notes (Signed)
Chief Complaint  Patient presents with  . Follow-up    Status post heart catheterization  . Pre-op Exam  . Hypertension    REQUESTING PHYSICIAN:  [name], NP [place],  [place] [address]  HPI  Joe Stewart is a 63 y.o. male who presents to the office with a chief complaint of " test results." Patient's past medical history and cardiac risk factors include: Hypertension, obesity, osteoarthritis.  Patient is referred to the office at the request of Dr. Marlou Sa of [facility] for preoperative or stratification prior to her upcoming left knee replacement currently scheduled for March 28, 2019.  Patient went for preoperative lab work and testing and he was informed that his EKG is abnormal therefore cardiology consultation was requested.  Due to limited functional status patient underwent cardiac work-up preoperatively.  Which included an echocardiogram and nuclear stress test.  Nuclear stress test was concerning for a moderate sized, mild intensity ischemia in the inferior and inferolateral region.  The study was noted to be intermediate in risk.  In addition, patient had multiple cardiovascular risk factors and therefore left heart catheterization was recommended.  Patient was not found to have any obstructive disease on a recent left heart catheterization.  Results noted below for further reference and reviewed with the patient at today's office visit.  Patient has done well post procedurally without any complications.  Hypertension: During the initial office visit for preoperative risk stratification patient was noted to have hypertensive urgency.  Patient is initial blood pressure was 216/105 with a heart rate of 83 bpm on repeat evaluation and systolic blood pressure was 180/95.  His antihypertensive medications have been uptitrated in a stepwise fashion.  He is currently on Norvasc, hydrochlorothiazide, metoprolol succinate.  His morning systolic blood pressures  range between [id number] mmHg and diastolic blood pressures range between 72-75 mmHg.  His p.m. blood pressures range between [id number] mmHg and diastolic blood pressures range between 70-75 mmHg and pulse consistently greater than 60 bpm.  His blood pressure is elevated during today's office visit despite being rechecked twice.  At the last office visit patient stated that his shortness of breath had improved by 20% and today he states that it has improved compared to our initial visit by at least 80%.  He is not having any chest pain at rest or with effort related activities.  He is nondiabetic, most recent serum creatinine level is 1 mg/dL, no prior history of congestive heart failure, myocardial infarction, DVT/PE, CVA/TIA.    Review of systems positive for: shortness of breath effort related symptoms (improving). Currently patient denies chest pain,  lightheadedness, dizziness, palpitations, orthopnea, paroxysmal nocturnal dyspnea, lower extremity swelling, near syncope, syncopal events, hematochezia, hemoptysis, hematemesis, melanotic stools, no symptoms of amaurosis fugax, motor or sensory symptoms or dysphasia in the last 6 months.   FUNCTIONAL STATUS: No structured routine or exercise program.    ALLERGIES: No Known Allergies  MEDICATION LIST PRIOR TO VISIT: Current Outpatient Medications on File Prior to Visit  Medication Sig Dispense Refill  . acetaminophen (TYLENOL) 500 MG tablet Take 1,000 mg by mouth every 6 (six) hours as needed for moderate pain.     Marland Kitchen atorvastatin (LIPITOR) 40 MG tablet Take 1 tablet (40 mg total) by mouth daily. 90 tablet 3  . celecoxib (CELEBREX) 200 MG capsule TAKE 1 CAPSULE (200 MG TOTAL) BY MOUTH 2 (TWO) TIMES DAILY. ADDITIONALLY, TAKE 400MG  (2 CAPSULES) BY MOUTH THE MORNING OF SURGERY AS SOON AS YOU WAKE UP  WITH A SMALL SIP OF WATER. NO FOOD. 60 capsule 0   No current facility-administered medications on file prior to visit.    PAST MEDICAL HISTORY: Past  Medical History:  Diagnosis Date  . Arthritis   . Hypertension   . Obesity     PAST SURGICAL HISTORY: Past Surgical History:  Procedure Laterality Date  . HERNIA REPAIR    . HERNIA REPAIR     35 years ago  . LEFT HEART CATH AND CORONARY ANGIOGRAPHY N/A 04/11/2019   Procedure: LEFT HEART CATH AND CORONARY ANGIOGRAPHY;  Surgeon: Adrian Prows, MD;  Location: [facility];  Service: Cardiovascular;  Laterality: N/A;  . RENAL ANGIOGRAPHY Bilateral 04/11/2019   Procedure: RENAL ANGIOGRAPHY;  Surgeon: Adrian Prows, MD;  Location: [facility];  Service: Cardiovascular;  Laterality: Bilateral;    FAMILY HISTORY: The patient family history includes COPD in his mother and sister; Cancer in his mother; Diabetes in his mother; Healthy in his brother and son; Heart disease in his brother and father.   SOCIAL HISTORY:  The patient  reports that he has never smoked. He has never used smokeless tobacco. He reports previous alcohol use. He reports that he does not use drugs.  14 ORGAN REVIEW OF SYSTEMS: CONSTITUTIONAL: No fever or significant weight loss EYES: No recent significant visual change EARS, NOSE, MOUTH, THROAT: No recent significant change in hearing CARDIOVASCULAR: See discussion in subjective/HPI RESPIRATORY: See discussion in subjective/HPI GASTROINTESTINAL: No recent complaints of abdominal pain GENITOURINARY: No recent significant change in genitourinary status MUSCULOSKELETAL: No recent significant change in musculoskeletal status INTEGUMENTARY: No recent rash NEUROLOGIC: No recent significant change in motor function PSYCHIATRIC: No recent significant change in mood ENDOCRINOLOGIC: No recent significant change in endocrine status HEMATOLOGIC/LYMPHATIC: No recent significant unexpected bruising ALLERGIC/IMMUNOLOGIC: No recent unexplained allergic reaction  PHYSICAL EXAM: Vitals with BMI 04/21/2019 04/11/2019 04/11/2019  Height 5\' 8"  - -  Weight 245 lbs 10 oz - -  BMI [id number]  - -  Systolic [id number]  Diastolic 82 71 68  Pulse 78 74 -   CONSTITUTIONAL: Well-developed and well-nourished. No acute distress.  SKIN: Skin is warm and dry. No rash noted. No cyanosis. No pallor. No jaundice HEAD: Normocephalic and atraumatic.  EYES: No scleral icterus MOUTH/THROAT: Moist oral membranes.  NECK: No JVD present. No thyromegaly noted. No carotid bruits  LYMPHATIC: No visible cervical adenopathy.  CHEST Normal respiratory effort. No intercostal retractions  LUNGS: Clear to auscultations bilaterally.  No stridor. No wheezes. No rales.  CARDIOVASCULAR: Regular rate and rhythm, positive S1-S2, no murmurs rubs or gallops appreciated. ABDOMINAL: Obese, soft, nontender, nondistended, positive bowel sounds all 4 quadrants.  No apparent ascites.  EXTREMITIES: No peripheral edema  HEMATOLOGIC: No significant bruising NEUROLOGIC: Oriented to person, place, and time. Nonfocal. Normal muscle tone.  PSYCHIATRIC: Normal mood and affect. Normal behavior. Cooperative  CARDIAC DATABASE: EKG: 03/24/2019: Normal sinus rhythm, ventricular rate 93 bpm, right bundle branch block, left atrial enlargement, ST-T changes most likely secondary to underlying right bundle branch block.  No apparent underlying ischemia or injury pattern.  Echocardiogram: 03/27/2019:  Normal LV systolic function with visual EF 50-55%. Left ventricle cavity is normal in size. Normal global wall motion. Normal diastolic filling pattern, normal LAP. Mild left ventricular hypertrophy.Aortic sclerosis without stenosis.   Stress Testing:  Lexiscan Tetrofosmin Stress Test  03/27/2019: Myocardial perfusion is abnormal. There is a partially reversible moderate sized mild ischemia in the inferior and inferolateral region. Stress LV EF: 61%. Mild  diaphragmatic attenuation noted. Intermediate risk study.   Left heart catheterization selective renal arteriogram 04/11/2019: Moderate diffuse coronary artery disease with  calcification.  Right coronary artery with arteriomegaly and ectasia and slow filling. Left main: Normal. LAD arteriomegaly, ectasia noted, slow filling throughout the LAD.  Diffuse disease. Circumflex: Large vessel, arteriomegaly, diffuse ectasia noted.  Slow filling. Selective right and left renal arteriogram: Normal renal arteries.  No evidence of renal artery stenosis.  LABORATORY DATA: CBC Latest Ref Rng & Units 03/22/2019  WBC 4.0 - 10.5 K/uL 7.1  Hemoglobin 13.0 - 17.0 g/dL 13.8  Hematocrit 39.0 - 52.0 % 43.1  Platelets 150 - 400 K/uL 254    CMP Latest Ref Rng & Units 03/30/2019 03/22/2019  Glucose 65 - 99 mg/dL 217(H) 185(H)  BUN 8 - 27 mg/dL 24 20  Creatinine 0.76 - 1.27 mg/dL 1.09 0.92  Sodium 134 - 144 mmol/L 138 139  Potassium 3.5 - 5.2 mmol/L 4.8 4.6  Chloride 96 - 106 mmol/L 96 103  CO2 20 - 29 mmol/L 22 23  Calcium 8.6 - 10.2 mg/dL 10.6(H) 9.5    Lipid Panel     Component Value Date/Time   CHOL 146 04/07/2019 0929   TRIG 163 (H) 04/07/2019 0929   HDL 37 (L) 04/07/2019 0929   LDLCALC 81 04/07/2019 0929   LABVLDL 28 04/07/2019 0929   FINAL MEDICATION LIST END OF ENCOUNTER: Meds ordered this encounter  Medications  . losartan (COZAAR) 25 MG tablet    Sig: Take 1 tablet (25 mg total) by mouth every evening.    Dispense:  90 tablet    Refill:  3  . amLODipine (NORVASC) 10 MG tablet    Sig: Take 1 tablet (10 mg total) by mouth in the morning.    Dispense:  90 tablet    Refill:  1  . hydrochlorothiazide (HYDRODIURIL) 25 MG tablet    Sig: Take 1 tablet (25 mg total) by mouth in the morning.    Dispense:  90 tablet    Refill:  1  . metoprolol succinate (TOPROL-XL) 25 MG 24 hr tablet    Sig: Take 1 tablet (25 mg total) by mouth daily. Take with or immediately following a meal. Hold if systolic blood pressure (top blood pressure number) less than 100 mmHg or heart rate less than 60 bpm (pulse).    Dispense:  90 tablet    Refill:  1    Medications Discontinued  During This Encounter  Medication Reason  . hydrochlorothiazide (MICROZIDE) 12.5 MG capsule Dose change  . amLODipine (NORVASC) 10 MG tablet Reorder  . metoprolol succinate (TOPROL-XL) 25 MG 24 hr tablet Reorder     Current Outpatient Medications:  .  acetaminophen (TYLENOL) 500 MG tablet, Take 1,000 mg by mouth every 6 (six) hours as needed for moderate pain. , Disp: , Rfl:  .  amLODipine (NORVASC) 10 MG tablet, Take 1 tablet (10 mg total) by mouth in the morning., Disp: 90 tablet, Rfl: 1 .  atorvastatin (LIPITOR) 40 MG tablet, Take 1 tablet (40 mg total) by mouth daily., Disp: 90 tablet, Rfl: 3 .  celecoxib (CELEBREX) 200 MG capsule, TAKE 1 CAPSULE (200 MG TOTAL) BY MOUTH 2 (TWO) TIMES DAILY. ADDITIONALLY, TAKE 400MG  (2 CAPSULES) BY MOUTH THE MORNING OF SURGERY AS SOON AS YOU WAKE UP WITH A SMALL SIP OF WATER. NO FOOD., Disp: 60 capsule, Rfl: 0 .  metoprolol succinate (TOPROL-XL) 25 MG 24 hr tablet, Take 1 tablet (25 mg total) by mouth daily.  Take with or immediately following a meal. Hold if systolic blood pressure (top blood pressure number) less than 100 mmHg or heart rate less than 60 bpm (pulse)., Disp: 90 tablet, Rfl: 1 .  hydrochlorothiazide (HYDRODIURIL) 25 MG tablet, Take 1 tablet (25 mg total) by mouth in the morning., Disp: 90 tablet, Rfl: 1 .  losartan (COZAAR) 25 MG tablet, Take 1 tablet (25 mg total) by mouth every evening., Disp: 90 tablet, Rfl: 3  IMPRESSION:    ICD-10-CM   1. Pre-operative clearance  Z01.818   2. Mixed hyperlipidemia  E78.2   3. RBBB  I45.10   4. Dyspnea on exertion  R06.00 EKG 12-Lead  5. Essential hypertension  I10 losartan (COZAAR) 25 MG tablet    Basic metabolic panel    amLODipine (NORVASC) 10 MG tablet    hydrochlorothiazide (HYDRODIURIL) 25 MG tablet    metoprolol succinate (TOPROL-XL) 25 MG 24 hr tablet  6. Class 2 obesity due to excess calories without serious comorbidity with body mass index (BMI) of 37.0 to 37.9 in adult  E66.09    Z68.37       RECOMMENDATIONS: Preoperative risk stratification:  EKG shows normal sinus rhythm with right bundle branch block and left atrial enlargement.  Echocardiogram notes preserved left ventricular systolic function without significant valvular heart disease.  Given the fact the patient's functional status is unknown underwent stress test which was noted to be intermediate risk study and given his cardiovascular risk factors underwent a left heart catheterization.  No mention of obstructive CAD on angiography.  Patient presents acceptable / low cardiac risk for the planned noncardiac surgery. Using the preoperative risk assessment guidelines as published by the [employer] of [employer], the patient presents acceptable /low cardiac risk for the planned noncardiac surgery because there is no recent unstable angina pectoris or recent myocardial infarction, no signs or symptoms of CHF, no aortic valvular stenosis, no complex rhythm disorder. The final decision regarding "clearance" for a procedure is ultimately the surgeon's decision to make after weighing risk/benefits/alternatives. This reply can assist the surgeon to estimate cardiac risk for the proposed surgery. I defer to the surgeon to assess the inherent procedural risk, expected procedural benefit, and possible procedural alternatives.  Benign essential hypertension: Improving. Office blood pressure is at not goal, but improving. Home BP are better controlled.   Medication reconciled.  We will start losartan 25 mg p.o. every afternoon.  Will recheck a BMP 1 week after initiation of ARBs.  Medication profile discussed with the patient. Patient is asked to keep a log of both blood pressure and pulse so that medications can be titrated based on a blood pressure trend as opposed to isolated blood pressure readings in the office. If the blood pressure is consistently greater than 160mmHg patient is asked to call the office to for medication  titration sooner than the next office visit.  Low salt diet recommended. A diet that is rich in fruits, vegetables, legumes, and low-fat dairy products and low in snacks, sweets, and meats (such as the Dietary Approaches to Stop Hypertension [DASH] diet).   Abnormal nuclear stress test:   No obstructive CAD noted on invasive angiography.  Abnormal nuclear stress test related to severe diffuse disease and ectasia of the coronary vessels, markedly reduced blood flow especially in the right coronary artery but also in the circumflex and LAD.  This is related to endothelial dysfunction and microvascular disease along with ectasia contributing to slow flow.  Findings suggestive of hypertensive heart disease.  Educated on importance of continued risk factor modifications for primary prevention.  Hyperlipidemia: Newly diagnosed/uncontrolled  Continue Lipitor 40 mg p.o. nightly. Follow lipids. Currently managed by primary care provider. Patient denies myalgia or other side effects.  Right bundle branch block: See above.  Class II obesity, due to excess calories. Body mass index is 37.34 kg/m. I reviewed with the patient the importance of diet, regular physical activity/exercise, weight loss.    Patient is educated on increasing physical activity gradually as tolerated.  With the goal of moderate intensity exercise for 30 minutes a day 5 days a week.  Orders Placed This Encounter  Procedures  . Basic metabolic panel  . EKG 12-Lead   --Continue cardiac medications as reconciled in final medication list. --Return in about 3 months (around 07/22/2019) for HTN, post-op.. Or sooner if needed. --Continue follow-up with your primary care physician regarding the management of your other chronic comorbid conditions.  Patient's questions and concerns were addressed to his satisfaction. He voices understanding of the instructions provided during this encounter.   This note was created using a  voice recognition software as a result there may be grammatical errors inadvertently enclosed that do not reflect the nature of this encounter. Every attempt is made to correct such errors.  Rex Kras, DO, [facility]. [place] Office: [phone]

## 2019-04-21 NOTE — Patient Instructions (Signed)
Please keep a blood pressure log and bring that in at  your next office visit. Call the office if the top number is consistently greater than 123mmHg.   Please remember to bring in your medication bottles in at the next visit.   New Medications that were added at today's visit:  Losartan 25 mg p.o. every afternoon  Medications that were discontinued at today's visit: None  Please get labs done 1 week after starting losartan at the nearest [place].  Recommend follow up with your PCP as scheduled.

## 2019-04-23 ENCOUNTER — Other Ambulatory Visit: Payer: Self-pay | Admitting: Cardiology

## 2019-04-23 DIAGNOSIS — I10 Essential (primary) hypertension: Secondary | ICD-10-CM

## 2019-04-24 ENCOUNTER — Other Ambulatory Visit: Payer: Self-pay

## 2019-04-25 NOTE — Progress Notes (Addendum)
[id number] [address] [place], [place] [address] Phone: [phone] Fax: [phone]      Your procedure is scheduled on May 09, 2019.  Report to [facility] Main Entrance "A" at 05:30 A.M., and check in at the Admitting office.  Call this number if you have problems the morning of surgery:  [phone]   Call [phone] if you have any questions prior to your surgery date Monday-Friday 8am-4pm    Remember:  Do not eat or drink after midnight the night before your surgery     Take these medicines the morning of surgery with A SIP OF WATER: Acetaminophen (Tylenol) - if needed Amlodipine (Norvasc) Atorvastatin (Lipitor) Metoprolol succinate (Toprol-XL)   7 days prior to surgery, stop taking all Aspirin (unless instructed by your doctor) and other Aspirin containing products, Vitamins, Fish Oils, and Herbal Medications. Also stop all NSAIDS i.e. Advil, Ibuprofen, Motrin, Aleve, Anaprox, Naproxen, BC, Goody Powders, and all Supplements.  Stop taking Celecoxib (Celebrex) 7 days prior to surgery.   No Smoking of any kind, Tobacco, or Alcohol products 24 hours prior to your procedure.  If you use a CPAP at night, you may bring all equipment for your overnight stay.                        Do not wear jewelry            Do not wear lotions, powders, perfumes/colognes, or deodorant.            Do not shave 48 hours prior to surgery.  Men may shave face and neck.            Do not bring valuables to the hospital.            [facility] is not responsible for any belongings or valuables.   Contacts, glasses, dentures or partials may not be worn into surgery.      For patients admitted to the hospital, discharge time will be determined by your treatment team.   Patients discharged the day of surgery will not be allowed to drive home, and someone needs to stay with them for 24  hours.    Special instructions:   [place]- Preparing For Surgery  Before surgery, you can play an important role. Because skin is not sterile, your skin needs to be as free of germs as possible. You can reduce the number of germs on your skin by washing with CHG (chlorahexidine gluconate) Soap before surgery.  CHG is an antiseptic cleaner which kills germs and bonds with the skin to continue killing germs even after washing.    Oral Hygiene is also important to reduce your risk of infection.  Remember - BRUSH YOUR TEETH THE MORNING OF SURGERY WITH YOUR REGULAR TOOTHPASTE  Please do not use if you have an allergy to CHG or antibacterial soaps. If your skin becomes reddened/irritated stop using the CHG.  Do not shave (including legs and underarms) for at least 48 hours prior to first CHG shower. It is OK to shave your face.  Please follow these instructions carefully.   1. Shower the NIGHT BEFORE SURGERY and the MORNING OF SURGERY with CHG Soap.   2. If you chose to wash your hair, wash your hair first as usual with your normal shampoo.  3. After you shampoo, rinse your hair and body thoroughly to remove the  shampoo.  4. Use CHG as you would any other liquid soap. You can apply CHG directly to the skin and wash gently with a scrungie or a clean washcloth.   5. Apply the CHG Soap to your body ONLY FROM THE NECK DOWN.  Do not use on open wounds or open sores. Avoid contact with your eyes, ears, mouth and genitals (private parts). Wash Face and genitals (private parts)  with your normal soap.   6. Wash thoroughly, paying special attention to the area where your surgery will be performed.  7. Thoroughly rinse your body with warm water from the neck down.  8. DO NOT shower/wash with your normal soap after using and rinsing off the CHG Soap.  9. Pat yourself dry with a CLEAN TOWEL.  10. Wear CLEAN PAJAMAS to bed the night before surgery, wear comfortable clothes the morning of  surgery  11. Place CLEAN SHEETS on your bed the night of your first shower and DO NOT SLEEP WITH PETS.   Day of Surgery:   Do not apply any deodorants/lotions.  Please wear clean clothes to the hospital/surgery center.   Remember to brush your teeth WITH YOUR REGULAR TOOTHPASTE.   Please read over the following fact sheets that you were given.

## 2019-04-26 ENCOUNTER — Encounter (HOSPITAL_COMMUNITY)
Admission: RE | Admit: 2019-04-26 | Discharge: 2019-04-26 | Disposition: A | Discharge status: Home / Self Care | Payer: 59 | Source: Ambulatory Visit | Attending: Orthopedic Surgery | Admitting: Orthopedic Surgery

## 2019-04-26 ENCOUNTER — Other Ambulatory Visit: Payer: Self-pay

## 2019-04-26 ENCOUNTER — Encounter (HOSPITAL_COMMUNITY): Payer: Self-pay

## 2019-04-26 DIAGNOSIS — Z79899 Other long term (current) drug therapy: Secondary | ICD-10-CM | POA: Insufficient documentation

## 2019-04-26 DIAGNOSIS — Z01812 Encounter for preprocedural laboratory examination: Secondary | ICD-10-CM | POA: Insufficient documentation

## 2019-04-26 DIAGNOSIS — Z6837 Body mass index (BMI) 37.0-37.9, adult: Secondary | ICD-10-CM | POA: Diagnosis not present

## 2019-04-26 DIAGNOSIS — Z7901 Long term (current) use of anticoagulants: Secondary | ICD-10-CM | POA: Diagnosis not present

## 2019-04-26 DIAGNOSIS — E669 Obesity, unspecified: Secondary | ICD-10-CM | POA: Insufficient documentation

## 2019-04-26 DIAGNOSIS — I10 Essential (primary) hypertension: Secondary | ICD-10-CM | POA: Diagnosis not present

## 2019-04-26 DIAGNOSIS — M1712 Unilateral primary osteoarthritis, left knee: Secondary | ICD-10-CM | POA: Insufficient documentation

## 2019-04-26 LAB — BASIC METABOLIC PANEL
Anion gap: 12 (ref 5–15)
BUN: 22 mg/dL (ref 8–23)
CO2: 26 mmol/L (ref 22–32)
Calcium: 9.5 mg/dL (ref 8.9–10.3)
Chloride: 99 mmol/L (ref 98–111)
Creatinine, Ser: 1.09 mg/dL (ref 0.61–1.24)
GFR calc Af Amer: >60 mL/min (ref >60)
GFR calc non Af Amer: >60 mL/min (ref >60)
Glucose, Bld: 345 mg/dL — ABNORMAL HIGH (ref 70–99)
Potassium: 4.5 mmol/L (ref 3.5–5.1)
Sodium: 137 mmol/L (ref 135–145)

## 2019-04-26 LAB — SURGICAL PCR SCREEN
MRSA, PCR: NEGATIVE
Staphylococcus aureus: NEGATIVE

## 2019-04-26 LAB — CBC
HCT: 41.8 % (ref 39.0–52.0)
Hemoglobin: 13.8 g/dL (ref 13.0–17.0)
MCH: 30 pg (ref 26.0–34.0)
MCHC: 33 g/dL (ref 30.0–36.0)
MCV: 90.9 fL (ref 80.0–100.0)
Platelets: 189 10*3/uL (ref 150–400)
RBC: 4.6 MIL/uL (ref 4.22–5.81)
RDW: 13 % (ref 11.5–15.5)
WBC: 7.3 10*3/uL (ref 4.0–10.5)
nRBC: 0 % (ref 0.0–0.2)

## 2019-04-26 NOTE — Progress Notes (Signed)
Glucose at pre-admission appointment was 345.  Patient stated he is not diabetic.  Chart sent to anesthesia for review.

## 2019-04-26 NOTE — Progress Notes (Signed)
PCP - [name] Cardiologist - Dr. Rex Kras   Chest x-ray - n/a EKG - 04-21-19 Stress Test - 03-27-19 ECHO - 03-28-19 Cardiac Cath - 04-11-19   COVID TEST- 05-05-19  Anesthesia review: yes, heart history  Patient denies shortness of breath, fever, cough and chest pain at PAT appointment   All instructions explained to the patient, with a verbal understanding of the material. Patient agrees to go over the instructions while at home for a better understanding. Patient also instructed to self quarantine after being tested for COVID-19. The opportunity to ask questions was provided.

## 2019-04-27 ENCOUNTER — Encounter (HOSPITAL_COMMUNITY): Payer: Self-pay | Admitting: Vascular Surgery

## 2019-04-27 LAB — HEMOGLOBIN A1C
Hgb A1c MFr Bld: 9.6 % — ABNORMAL HIGH (ref 4.8–5.6)
Mean Plasma Glucose: 228.82 mg/dL

## 2019-04-27 NOTE — Progress Notes (Signed)
Anesthesia Chart Review:  Case: [id number] Date/Time: 05/09/19 0715   Procedure: LEFT TOTAL KNEE ARTHROPLASTY (Left Knee)   Anesthesia type: Spinal   Pre-op diagnosis: left knee osteoarthritis   Location: [facility] OR ROOM 06 / [place] OR   Surgeons: Meredith Pel, MD      DISCUSSION: Patient is a 64 year old male scheduled for the above procedure. Surgery was initially scheduled for 03/28/19, but surgery postponed for cardiology evaluation due to possible inferior ischemia on preoperative EKG.  Since then patient has been evaluated by cardiologist Rex Kras, DO at [facility] and has had a stress, echo, and cardiac cath. Anti-hypertensive medications also added. Stress test was intermediate risk with 04/11/19 LHC showing arteriomegaly with ectasia and diffuse disease related to endothelial dysfunction and microvascular disease and suggestive of hypertensive heart disease. Aggressive risk factor modification recommended. Per Dr. Terri Skains, patient "acceptable / low cardiac risk for the planned noncardiac surgery."  Unfortunately, 04/26/19 PAT labs showed a glucose of 345. He does not have a known history of diabetes, although glucose elevated at 185 and 217 on labs from last month. Given hyperglycemia, A1c ordered and was elevated at 9.6%, consistent with average glucose of 228.82. I have notified Debbie at Dr. Randel Pigg office. Patient will need primary care evaluation for A1c results consistent with a new diagnosis of DM.    Other history includes never smoker, HTN, hernia repair. BMI is consistent with obesity.    VS: BP (!) 152/81   Pulse 75   Temp 37 C (Oral)   Resp 18   Ht 5\' 8"  (1.727 m)   Wt 111.1 kg   SpO2 99%   BMI 37.25 kg/m    PROVIDERS: [name], NP is PCP (at Primary [facility] at [facility]; established care 02/27/19) Rex Kras, DO is cardiologist [facility])   LABS: Preoperative labs noted. A1c added due to glucose of 345 and resulted at 9.6%.   (all labs  ordered are listed, but only abnormal results are displayed)  Labs Reviewed  BASIC METABOLIC PANEL - Abnormal; Notable for the following components:      Result Value   Glucose, Bld 345 (*)    All other components within normal limits  SURGICAL PCR SCREEN  CBC     IMAGES: MRI left knee 03/04/19: IMPRESSION: 1. Marked bone marrow edema throughout the medial femoral condyle and medial tibial plateau with subchondral fractures of the weight-bearing surfaces. These findings could be related to osteonecrosis with collapse. Additional differential considerations include both inflammatory and crystalline arthropathies. An infectious arthropathy is also a consideration given the degree of bone loss and associated marrow edema. Arthrocentesis is recommended for further evaluation. 2. Small to moderate joint effusion with synovitis. 3. Maceration of the medial meniscal body and posterior horn with horizontal and vertical components.   EKG: EKG 04/21/2019: Sinus  Rhythm, ventricular rate 77 bpm. Right bundle branch block.  ST-T changes secondary to RBBB. Similar findings to prior EKG.  EKG 03/22/2019: Normal sinus rhythm Right bundle branch block T wave abnormality, consider inferior ischemia Abnormal ECG No previous tracing Confirmed by Cristopher Peru [phone]) on 03/22/2019 10:48:53 PM   CV: Left heart catheterization selective renal arteriogram 04/11/2019 Adrian Prows, MD):  The left ventricular systolic function is normal.  LV end diastolic pressure is normal.  Moderate diffuse coronary artery disease with calcification.  Right coronary artery with arteriomegaly and ectasia and slow filling. Left main: Normal. LAD arteriomegaly, ectasia noted, slow filling throughout the LAD.  Diffuse disease. Circumflex:  Large vessel, arteriomegaly, diffuse ectasia noted.  Slow filling.  Selective right and left renal arteriogram: Normal renal arteries.  No evidence of renal artery  stenosis.  Impression: Abnormal nuclear stress test related to severe diffuse disease and ectasia of the coronary vessels, markedly reduced blood flow especially in the right coronary artery but also in the circumflex and LAD.  This is related to endothelial dysfunction and microvascular disease along with ectasia contributing to slow flow. Findings suggestive of hypertensive heart disease.  Recommendation: Patient needs aggressive risk factor modification.  If he persist to have any symptoms of angina pectoris, consider low-dose Xarelto 2.5 mg twice daily to improve flow in the coronary arteries empirically.  Could also try dual antiplatelet therapy for a short time. - Recommend Aspirin 81mg  daily for moderate CAD.   Lexiscan Tetrofosmin Stress Test  03/27/2019: Nondiagnostic ECG stress. Hypertensive at rest. Normal BP response to Lexiscan infusion.  Myocardial perfusion is abnormal. There is a partially reversible moderate sized mild ischemia in the inferior and inferolateral region.  Stress LV EF: 61%.  Mild diaphragmatic attenuation noted.  Intermediate risk study. No previous exam available for comparison.   Echocardiogram 03/27/2019:  1. Normal LV systolic function with visual EF 50-55%. Left ventricle  cavity is normal in size. Normal global wall motion. Normal diastolic  filling pattern, normal LAP. Mild left ventricular hypertrophy. Calculated  EF 50%.  2. Aortic sclerosis without stenosis.  3. No significant valvular abnormalities.  4. No prior study for comparison.    Past Medical History:  Diagnosis Date  . Arthritis   . Hypertension   . Obesity     Past Surgical History:  Procedure Laterality Date  . HERNIA REPAIR    . HERNIA REPAIR     35 years ago  . LEFT HEART CATH AND CORONARY ANGIOGRAPHY N/A 04/11/2019   Procedure: LEFT HEART CATH AND CORONARY ANGIOGRAPHY;  Surgeon: Adrian Prows, MD;  Location: [place] [facility];  Service: Cardiovascular;  Laterality: N/A;   . RENAL ANGIOGRAPHY Bilateral 04/11/2019   Procedure: RENAL ANGIOGRAPHY;  Surgeon: Adrian Prows, MD;  Location: [place] [facility];  Service: Cardiovascular;  Laterality: Bilateral;    MEDICATIONS: . acetaminophen (TYLENOL) 500 MG tablet  . amLODipine (NORVASC) 10 MG tablet  . atorvastatin (LIPITOR) 40 MG tablet  . celecoxib (CELEBREX) 200 MG capsule  . hydrochlorothiazide (HYDRODIURIL) 25 MG tablet  . losartan (COZAAR) 25 MG tablet  . metoprolol succinate (TOPROL-XL) 25 MG 24 hr tablet   No current facility-administered medications for this encounter.    Myra Gianotti, PA-C Surgical Short Stay/Anesthesiology [facility] Phone [phone] [facility] Phone [phone] 04/27/2019 5:36 PM

## 2019-04-30 ENCOUNTER — Other Ambulatory Visit: Payer: Self-pay | Admitting: Cardiology

## 2019-04-30 DIAGNOSIS — R0609 Other forms of dyspnea: Secondary | ICD-10-CM

## 2019-04-30 DIAGNOSIS — I10 Essential (primary) hypertension: Secondary | ICD-10-CM

## 2019-05-01 ENCOUNTER — Encounter: Payer: Self-pay | Admitting: Registered Nurse

## 2019-05-01 ENCOUNTER — Telehealth: Payer: Self-pay | Admitting: Gastroenterology

## 2019-05-01 ENCOUNTER — Other Ambulatory Visit: Payer: Self-pay

## 2019-05-01 ENCOUNTER — Ambulatory Visit (INDEPENDENT_AMBULATORY_CARE_PROVIDER_SITE_OTHER): Payer: 59 | Admitting: Registered Nurse

## 2019-05-01 ENCOUNTER — Encounter: Payer: Self-pay | Admitting: Gastroenterology

## 2019-05-01 VITALS — BP 163/81 | HR 80 | Temp 97.3°F | Resp 16 | Ht 68.0 in | Wt 243.4 lb

## 2019-05-01 DIAGNOSIS — N1831 Chronic kidney disease, stage 3a: Secondary | ICD-10-CM | POA: Insufficient documentation

## 2019-05-01 DIAGNOSIS — Z1211 Encounter for screening for malignant neoplasm of colon: Secondary | ICD-10-CM | POA: Diagnosis not present

## 2019-05-01 DIAGNOSIS — Z114 Encounter for screening for human immunodeficiency virus [HIV]: Secondary | ICD-10-CM

## 2019-05-01 DIAGNOSIS — E119 Type 2 diabetes mellitus without complications: Secondary | ICD-10-CM

## 2019-05-01 DIAGNOSIS — Z1159 Encounter for screening for other viral diseases: Secondary | ICD-10-CM | POA: Diagnosis not present

## 2019-05-01 MED ORDER — METFORMIN HCL 1000 MG PO TABS: 1000.0000 mg | ORAL_TABLET | Freq: Two times a day (BID) | ORAL | 3 refills | Status: DC

## 2019-05-01 NOTE — Telephone Encounter (Signed)
Two 12.5mg  tablets; which is total of 25mg  a day.   If due to refill send in HCTZ 25mg  po qAM.

## 2019-05-01 NOTE — Progress Notes (Signed)
Established Patient Office Visit  Subjective:  Patient ID: Joe Stewart, male    DOB: 1955/08/27  Age: 64 y.o. MRN: [id number]  CC:  Chief Complaint  Patient presents with  . Diabetes    patient states he went to see several doctors about his knee pain and states that he found out he had problems with his heart as well as diabetes    HPI Joe Stewart presents for newly dx t2dm Was at preop visit for tka, had fasting sugar >300, A1c 9.6 Here today to start treatment  Feeling well overall, wants to get surgery done so he can return to full duty at work  Past Medical History:  Diagnosis Date  . Arthritis   . Hypertension   . Obesity     Past Surgical History:  Procedure Laterality Date  . HERNIA REPAIR    . HERNIA REPAIR     35 years ago  . LEFT HEART CATH AND CORONARY ANGIOGRAPHY N/A 04/11/2019   Procedure: LEFT HEART CATH AND CORONARY ANGIOGRAPHY;  Surgeon: Adrian Prows, MD;  Location: [place] [facility];  Service: Cardiovascular;  Laterality: N/A;  . RENAL ANGIOGRAPHY Bilateral 04/11/2019   Procedure: RENAL ANGIOGRAPHY;  Surgeon: Adrian Prows, MD;  Location: [place] [facility];  Service: Cardiovascular;  Laterality: Bilateral;    Family History  Problem Relation Age of Onset  . Cancer Mother   . Diabetes Mother   . COPD Mother   . Heart disease Father   . COPD Sister   . Healthy Brother   . Heart disease Brother   . Healthy Son     Social History   Socioeconomic History  . Marital status: Single    Spouse name: Not on file  . Number of children: 1  . Years of education: Not on file  . Highest education level: Not on file  Occupational History  . Not on file  Tobacco Use  . Smoking status: Never Smoker  . Smokeless tobacco: Never Used  Substance and Sexual Activity  . Alcohol use: Not Currently  . Drug use: Never  . Sexual activity: Not Currently  Other Topics Concern  . Not on file  Social History Narrative  . Not on file   Social Determinants of Health    Financial Resource Strain:   . Difficulty of Paying Living Expenses:   Food Insecurity:   . Worried About Charity fundraiser in the Last Year:   . Arboriculturist in the Last Year:   Transportation Needs:   . Film/video editor (Medical):   Marland Kitchen Lack of Transportation (Non-Medical):   Physical Activity:   . Days of Exercise per Week:   . Minutes of Exercise per Session:   Stress:   . Feeling of Stress :   Social Connections:   . Frequency of Communication with Friends and Family:   . Frequency of Social Gatherings with Friends and Family:   . Attends Religious Services:   . Active Member of Clubs or Organizations:   . Attends Archivist Meetings:   Marland Kitchen Marital Status:   Intimate Partner Violence:   . Fear of Current or Ex-Partner:   . Emotionally Abused:   Marland Kitchen Physically Abused:   . Sexually Abused:     Outpatient Medications Prior to Visit  Medication Sig Dispense Refill  . acetaminophen (TYLENOL) 500 MG tablet Take 1,000 mg by mouth every 6 (six) hours as needed for moderate pain.     Marland Kitchen  amLODipine (NORVASC) 10 MG tablet Take 1 tablet (10 mg total) by mouth in the morning. 90 tablet 1  . atorvastatin (LIPITOR) 40 MG tablet Take 1 tablet (40 mg total) by mouth daily. 90 tablet 3  . celecoxib (CELEBREX) 200 MG capsule TAKE 1 CAPSULE (200 MG TOTAL) BY MOUTH 2 (TWO) TIMES DAILY. ADDITIONALLY, TAKE 400MG  (2 CAPSULES) BY MOUTH THE MORNING OF SURGERY AS SOON AS YOU WAKE UP WITH A SMALL SIP OF WATER. NO FOOD. 60 capsule 0  . hydrochlorothiazide (HYDRODIURIL) 25 MG tablet Take 1 tablet (25 mg total) by mouth in the morning. 90 tablet 1  . losartan (COZAAR) 25 MG tablet Take 1 tablet (25 mg total) by mouth every evening. 90 tablet 3  . metoprolol succinate (TOPROL-XL) 25 MG 24 hr tablet Take 1 tablet (25 mg total) by mouth daily. Take with or immediately following a meal. Hold if systolic blood pressure (top blood pressure number) less than 100 mmHg or heart rate less than 60  bpm (pulse). 90 tablet 1   No facility-administered medications prior to visit.    No Known Allergies  ROS Review of Systems  Constitutional: Negative.   HENT: Negative.   Eyes: Negative.   Respiratory: Negative.   Cardiovascular: Negative.   Gastrointestinal: Negative.   Endocrine: Negative.   Genitourinary: Negative.   Musculoskeletal: Negative.   Skin: Negative.   Allergic/Immunologic: Negative.   Neurological: Negative.   Hematological: Negative.   Psychiatric/Behavioral: Negative.   All other systems reviewed and are negative.     Objective:    Physical Exam  Constitutional: He is oriented to person, place, and time. He appears well-developed and well-nourished. No distress.  Cardiovascular: Normal rate and regular rhythm.  Pulmonary/Chest: Effort normal. No respiratory distress.  Neurological: He is alert and oriented to person, place, and time.  Skin: Skin is warm and dry. No rash noted. He is not diaphoretic. No erythema. No pallor.  Psychiatric: He has a normal mood and affect. His behavior is normal. Judgment and thought content normal.  Nursing note and vitals reviewed.   BP (!) 163/81   Pulse 80   Temp (!) 97.3 F (36.3 C) (Temporal)   Resp 16   Ht 5\' 8"  (1.727 m)   Wt 243 lb 6.4 oz (110.4 kg)   SpO2 98%   BMI 37.01 kg/m  Wt Readings from Last 3 Encounters:  05/01/19 243 lb 6.4 oz (110.4 kg)  04/26/19 245 lb (111.1 kg)  04/21/19 245 lb 9.6 oz (111.4 kg)     Health Maintenance Due  Topic Date Due  . Hepatitis C Screening  Never done  . FOOT EXAM  Never done  . OPHTHALMOLOGY EXAM  Never done  . HIV Screening  Never done  . COLONOSCOPY  Never done    There are no preventive care reminders to display for this patient.  No results found for: TSH Lab Results  Component Value Date   WBC 7.3 04/26/2019   HGB 13.8 04/26/2019   HCT 41.8 04/26/2019   MCV 90.9 04/26/2019   PLT 189 04/26/2019   Lab Results  Component Value Date   NA 137  04/26/2019   K 4.5 04/26/2019   CO2 26 04/26/2019   GLUCOSE 345 (H) 04/26/2019   BUN 22 04/26/2019   CREATININE 1.09 04/26/2019   CALCIUM 9.5 04/26/2019   ANIONGAP 12 04/26/2019   Lab Results  Component Value Date   CHOL 146 04/07/2019   Lab Results  Component Value Date  HDL 37 (L) 04/07/2019   Lab Results  Component Value Date   LDLCALC 81 04/07/2019   Lab Results  Component Value Date   TRIG 163 (H) 04/07/2019   No results found for: CHOLHDL Lab Results  Component Value Date   HGBA1C 9.6 (H) 04/26/2019      Assessment & Plan:   Problem List Items Addressed This Visit      Endocrine   Newly diagnosed diabetes ([place]) - Primary   Relevant Medications   metFORMIN (GLUCOPHAGE) 1000 MG tablet    Other Visit Diagnoses    Special screening for malignant neoplasm of colon       Relevant Orders   Ambulatory referral to Gastroenterology   Encounter for screening for HIV       Relevant Orders   HIV antibody (with reflex)   Need for hepatitis C screening test       Relevant Orders   Hepatitis C antibody      Meds ordered this encounter  Medications  . metFORMIN (GLUCOPHAGE) 1000 MG tablet    Sig: Take 1 tablet (1,000 mg total) by mouth 2 (two) times daily with a meal.    Dispense:  180 tablet    Refill:  3    Order Specific Question:   Supervising Provider    Answer:   Forrest Moron [id number]    Follow-up: No follow-ups on file.   PLAN  Metformin 1000mg  PO bid   Aggressive diet and exercise changes discussed  Return to clinic in 2 mos for t2dm check - if at 7.0 or below, we can proceed with pre op visit at that time  Patient encouraged to call clinic with any questions, comments, or concerns.  [name], NP

## 2019-05-01 NOTE — Patient Instructions (Addendum)
If you have lab work done today you will be contacted with your lab results within the next 2 weeks.  If you have not heard from [place] then please contact us. The fastest way to get your results is to register for My Chart.   IF you received an x-ray today, you will receive an invoice from [facility]. Please contact [facility] at [phone] with questions or concerns regarding your invoice.   IF you received labwork today, you will receive an invoice from [facility]. Please contact [facility] at [phone] with questions or concerns regarding your invoice.   Our billing staff will not be able to assist you with questions regarding bills from these companies.  You will be contacted with the lab results as soon as they are available. The fastest way to get your results is to activate your My Chart account. Instructions are located on the last page of this paperwork. If you have not heard from [place] regarding the results in 2 weeks, please contact this office.       Preventing Type 2 Diabetes Mellitus Type 2 diabetes (type 2 diabetes mellitus) is a long-term (chronic) disease that affects blood sugar (glucose) levels. Normally, a hormone called insulin allows glucose to enter cells in the body. The cells use glucose for energy. In type 2 diabetes, one or both of these problems may be present:  The body does not make enough insulin.  The body does not respond properly to insulin that it makes (insulin resistance). Insulin resistance or lack of insulin causes excess glucose to build up in the blood instead of going into cells. As a result, high blood glucose (hyperglycemia) develops, which can cause many complications. Being overweight or obese and having an inactive (sedentary) lifestyle can increase your risk for diabetes. Type 2 diabetes can be delayed or prevented by making certain nutrition and lifestyle changes. What nutrition changes can be made?   Eat healthy meals  and snacks regularly. Keep a healthy snack with you for when you get hungry between meals, such as fruit or a handful of nuts.  Eat lean meats and proteins that are low in saturated fats, such as chicken, fish, egg whites, and beans. Avoid processed meats.  Eat plenty of fruits and vegetables and plenty of grains that have not been processed (whole grains). It is recommended that you eat: ? 1?2 cups of fruit every day. ? 2?3 cups of vegetables every day. ? 6?8 oz of whole grains every day, such as oats, whole wheat, bulgur, brown rice, quinoa, and millet.  Eat low-fat dairy products, such as milk, yogurt, and cheese.  Eat foods that contain healthy fats, such as nuts, avocado, olive oil, and canola oil.  Drink water throughout the day. Avoid drinks that contain added sugar, such as soda or sweet tea.  Follow instructions from your health care provider about specific eating or drinking restrictions.  Control how much food you eat at a time (portion size). ? Check food labels to find out the serving sizes of foods. ? Use a kitchen scale to weigh amounts of foods.  Saute or steam food instead of frying it. Cook with water or broth instead of oils or butter.  Limit your intake of: ? Salt (sodium). Have no more than 1 tsp (2,400 mg) of sodium a day. If you have heart disease or high blood pressure, have less than ? tsp (1,500 mg) of sodium a day. ? Saturated fat. This is fat that  is solid at room temperature, such as butter or fat on meat. What lifestyle changes can be made? Activity   Do moderate-intensity physical activity for at least 30 minutes on at least 5 days of the week, or as much as told by your health care provider.  Ask your health care provider what activities are safe for you. A mix of physical activities may be best, such as walking, swimming, cycling, and strength training.  Try to add physical activity into your day. For example: ? Park in spots that are farther  away than usual, so that you walk more. For example, park in a far corner of the parking lot when you go to the office or the grocery store. ? Take a walk during your lunch break. ? Use stairs instead of elevators or escalators. Weight Loss  Lose weight as directed. Your health care provider can determine how much weight loss is best for you and can help you lose weight safely.  If you are overweight or obese, you may be instructed to lose at least 5?7 % of your body weight. Alcohol and Tobacco   Limit alcohol intake to no more than 1 drink a day for nonpregnant women and 2 drinks a day for men. One drink equals 12 oz of beer, 5 oz of wine, or 1 oz of hard liquor.  Do not use any tobacco products, such as cigarettes, chewing tobacco, and e-cigarettes. If you need help quitting, ask your health care provider. Work With [place] Provider  Have your blood glucose tested regularly, as told by your health care provider.  Discuss your risk factors and how you can reduce your risk for diabetes.  Get screening tests as told by your health care provider. You may have screening tests regularly, especially if you have certain risk factors for type 2 diabetes.  Make an appointment with a diet and nutrition specialist (registered dietitian). A registered dietitian can help you make a healthy eating plan and can help you understand portion sizes and food labels. Why are these changes important?  It is possible to prevent or delay type 2 diabetes and related health problems by making lifestyle and nutrition changes.  It can be difficult to recognize signs of type 2 diabetes. The best way to avoid possible damage to your body is to take actions to prevent the disease before you develop symptoms. What can happen if changes are not made?  Your blood glucose levels may keep increasing. Having high blood glucose for a long time is dangerous. Too much glucose in your blood can damage your blood  vessels, heart, kidneys, nerves, and eyes.  You may develop prediabetes or type 2 diabetes. Type 2 diabetes can lead to many chronic health problems and complications, such as: ? Heart disease. ? Stroke. ? Blindness. ? Kidney disease. ? Depression. ? Poor circulation in the feet and legs, which could lead to surgical removal (amputation) in severe cases. Where to find support  Ask your health care provider to recommend a registered dietitian, diabetes educator, or weight loss program.  Look for local or online weight loss groups.  Join a gym, fitness club, or outdoor activity group, such as a walking club. Where to find more information To learn more about diabetes and diabetes prevention, visit:  American Diabetes Association (ADA): www.diabetes.[employer] of Diabetes and Digestive and Kidney Diseases: FindSpin.nl To learn more about healthy eating, visit:  The U.S. Department of Agriculture Scientist, research (physical sciences)), Choose My Plate:  http://wiley-williams.com/  Office of Disease Prevention and Health Promotion (ODPHP), Dietary Guidelines: SurferLive.at Summary  You can reduce your risk for type 2 diabetes by increasing your physical activity, eating healthy foods, and losing weight as directed.  Talk with your health care provider about your risk for type 2 diabetes. Ask about any blood tests or screening tests that you need to have. This information is not intended to replace advice given to you by your health care provider. Make sure you discuss any questions you have with your health care provider. Document Revised: 05/13/2018 Document Reviewed: 03/12/2015 Elsevier Patient Education  [place].  Diabetes Mellitus and Nutrition, Adult When you have diabetes (diabetes mellitus), it is very important to have healthy eating habits because your blood sugar (glucose) levels are greatly affected by what you eat and  drink. Eating healthy foods in the appropriate amounts, at about the same times every day, can help you:  Control your blood glucose.  Lower your risk of heart disease.  Improve your blood pressure.  Reach or maintain a healthy weight. Every person with diabetes is different, and each person has different needs for a meal plan. Your health care provider may recommend that you work with a diet and nutrition specialist (dietitian) to make a meal plan that is best for you. Your meal plan may vary depending on factors such as:  The calories you need.  The medicines you take.  Your weight.  Your blood glucose, blood pressure, and cholesterol levels.  Your activity level.  Other health conditions you have, such as heart or kidney disease. How do carbohydrates affect me? Carbohydrates, also called carbs, affect your blood glucose level more than any other type of food. Eating carbs naturally raises the amount of glucose in your blood. Carb counting is a method for keeping track of how many carbs you eat. Counting carbs is important to keep your blood glucose at a healthy level, especially if you use insulin or take certain oral diabetes medicines. It is important to know how many carbs you can safely have in each meal. This is different for every person. Your dietitian can help you calculate how many carbs you should have at each meal and for each snack. Foods that contain carbs include:  Bread, cereal, rice, pasta, and crackers.  Potatoes and corn.  Peas, beans, and lentils.  Milk and yogurt.  Fruit and juice.  Desserts, such as cakes, cookies, ice cream, and candy. How does alcohol affect me? Alcohol can cause a sudden decrease in blood glucose (hypoglycemia), especially if you use insulin or take certain oral diabetes medicines. Hypoglycemia can be a life-threatening condition. Symptoms of hypoglycemia (sleepiness, dizziness, and confusion) are similar to symptoms of having too  much alcohol. If your health care provider says that alcohol is safe for you, follow these guidelines:  Limit alcohol intake to no more than 1 drink per day for nonpregnant women and 2 drinks per day for men. One drink equals 12 oz of beer, 5 oz of wine, or 1 oz of hard liquor.  Do not drink on an empty stomach.  Keep yourself hydrated with water, diet soda, or unsweetened iced tea.  Keep in mind that regular soda, juice, and other mixers may contain a lot of sugar and must be counted as carbs. What are tips for following this plan?  Reading food labels  Start by checking the serving size on the "Nutrition Facts" label of packaged foods and drinks. The amount of  calories, carbs, fats, and other nutrients listed on the label is based on one serving of the item. Many items contain more than one serving per package.  Check the total grams (g) of carbs in one serving. You can calculate the number of servings of carbs in one serving by dividing the total carbs by 15. For example, if a food has 30 g of total carbs, it would be equal to 2 servings of carbs.  Check the number of grams (g) of saturated and trans fats in one serving. Choose foods that have low or no amount of these fats.  Check the number of milligrams (mg) of salt (sodium) in one serving. Most people should limit total sodium intake to less than 2,300 mg per day.  Always check the nutrition information of foods labeled as "low-fat" or "nonfat". These foods may be higher in added sugar or refined carbs and should be avoided.  Talk to your dietitian to identify your daily goals for nutrients listed on the label. Shopping  Avoid buying canned, premade, or processed foods. These foods tend to be high in fat, sodium, and added sugar.  Shop around the outside edge of the grocery store. This includes fresh fruits and vegetables, bulk grains, fresh meats, and fresh dairy. Cooking  Use low-heat cooking methods, such as baking, instead  of high-heat cooking methods like deep frying.  Cook using healthy oils, such as olive, canola, or sunflower oil.  Avoid cooking with butter, cream, or high-fat meats. Meal planning  Eat meals and snacks regularly, preferably at the same times every day. Avoid going long periods of time without eating.  Eat foods high in fiber, such as fresh fruits, vegetables, beans, and whole grains. Talk to your dietitian about how many servings of carbs you can eat at each meal.  Eat 4-6 ounces (oz) of lean protein each day, such as lean meat, chicken, fish, eggs, or tofu. One oz of lean protein is equal to: ? 1 oz of meat, chicken, or fish. ? 1 egg. ?  cup of tofu.  Eat some foods each day that contain healthy fats, such as avocado, nuts, seeds, and fish. Lifestyle  Check your blood glucose regularly.  Exercise regularly as told by your health care provider. This may include: ? 150 minutes of moderate-intensity or vigorous-intensity exercise each week. This could be brisk walking, biking, or water aerobics. ? Stretching and doing strength exercises, such as yoga or weightlifting, at least 2 times a week.  Take medicines as told by your health care provider.  Do not use any products that contain nicotine or tobacco, such as cigarettes and e-cigarettes. If you need help quitting, ask your health care provider.  Work with a Social worker or diabetes educator to identify strategies to manage stress and any emotional and social challenges. Questions to ask a health care provider  Do I need to meet with a diabetes educator?  Do I need to meet with a dietitian?  What number can I call if I have questions?  When are the best times to check my blood glucose? Where to find more information:  American Diabetes Association: diabetes.org  Academy of Nutrition and Dietetics: www.eatright.[employer] of Diabetes and Digestive and Kidney Diseases (NIH): [employer]Fun[employer].dk Summary  A  healthy meal plan will help you control your blood glucose and maintain a healthy lifestyle.  Working with a diet and nutrition specialist (dietitian) can help you make a meal plan that is best for you.  Keep in mind that carbohydrates (carbs) and alcohol have immediate effects on your blood glucose levels. It is important to count carbs and to use alcohol carefully. This information is not intended to replace advice given to you by your health care provider. Make sure you discuss any questions you have with your health care provider. Document Revised: 01/01/2017 Document Reviewed: 02/24/2016 Elsevier Patient Education  2020 [place].  Diabetes Basics  Diabetes (diabetes mellitus) is a long-term (chronic) disease. It occurs when the body does not properly use sugar (glucose) that is released from food after you eat. Diabetes may be caused by one or both of these problems:  Your pancreas does not make enough of a hormone called insulin.  Your body does not react in a normal way to insulin that it makes. Insulin lets sugars (glucose) go into cells in your body. This gives you energy. If you have diabetes, sugars cannot get into cells. This causes high blood sugar (hyperglycemia). Follow these instructions at home: How is diabetes treated? You may need to take insulin or other diabetes medicines daily to keep your blood sugar in balance. Take your diabetes medicines every day as told by your doctor. List your diabetes medicines here: Diabetes medicines  Name of medicine: ______________________________ ? Amount (dose): _______________ Time (a.m./p.m.): _______________ Notes: ___________________________________  Name of medicine: ______________________________ ? Amount (dose): _______________ Time (a.m./p.m.): _______________ Notes: ___________________________________  Name of medicine: ______________________________ ? Amount (dose): _______________ Time (a.m./p.m.): _______________  Notes: ___________________________________ If you use insulin, you will learn how to give yourself insulin by injection. You may need to adjust the amount based on the food that you eat. List the types of insulin you use here: Insulin  Insulin type: ______________________________ ? Amount (dose): _______________ Time (a.m./p.m.): _______________ Notes: ___________________________________  Insulin type: ______________________________ ? Amount (dose): _______________ Time (a.m./p.m.): _______________ Notes: ___________________________________  Insulin type: ______________________________ ? Amount (dose): _______________ Time (a.m./p.m.): _______________ Notes: ___________________________________  Insulin type: ______________________________ ? Amount (dose): _______________ Time (a.m./p.m.): _______________ Notes: ___________________________________  Insulin type: ______________________________ ? Amount (dose): _______________ Time (a.m./p.m.): _______________ Notes: ___________________________________ How do I manage my blood sugar?  Check your blood sugar levels using a blood glucose monitor as directed by your doctor. Your doctor will set treatment goals for you. Generally, you should have these blood sugar levels:  Before meals (preprandial): 80-130 mg/dL (4.4-7.2 mmol/L).  After meals (postprandial): below 180 mg/dL (10 mmol/L).  A1c level: less than 7%. Write down the times that you will check your blood sugar levels: Blood sugar checks  Time: _______________ Notes: ___________________________________  Time: _______________ Notes: ___________________________________  Time: _______________ Notes: ___________________________________  Time: _______________ Notes: ___________________________________  Time: _______________ Notes: ___________________________________  Time: _______________ Notes: ___________________________________  What do I need to know about low blood  sugar? Low blood sugar is called hypoglycemia. This is when blood sugar is at or below 70 mg/dL (3.9 mmol/L). Symptoms may include:  Feeling: ? Hungry. ? Worried or nervous (anxious). ? Sweaty and clammy. ? Confused. ? Dizzy. ? Sleepy. ? Sick to your stomach (nauseous).  Having: ? A fast heartbeat. ? A headache. ? A change in your vision. ? Tingling or no feeling (numbness) around the mouth, lips, or tongue. ? Jerky movements that you cannot control (seizure).  Having trouble with: ? Moving (coordination). ? Sleeping. ? Passing out (fainting). ? Getting upset easily (irritability). Treating low blood sugar To treat low blood sugar, eat or drink something sugary right away. If you can think clearly and swallow safely,  follow the 15:15 rule:  Take 15 grams of a fast-acting carb (carbohydrate). Talk with your doctor about how much you should take.  Some fast-acting carbs are: ? Sugar tablets (glucose pills). Take 3-4 glucose pills. ? 6-8 pieces of hard candy. ? 4-6 oz (120-150 mL) of fruit juice. ? 4-6 oz (120-150 mL) of regular (not diet) soda. ? 1 Tbsp (15 mL) honey or sugar.  Check your blood sugar 15 minutes after you take the carb.  If your blood sugar is still at or below 70 mg/dL (3.9 mmol/L), take 15 grams of a carb again.  If your blood sugar does not go above 70 mg/dL (3.9 mmol/L) after 3 tries, get help right away.  After your blood sugar goes back to normal, eat a meal or a snack within 1 hour. Treating very low blood sugar If your blood sugar is at or below 54 mg/dL (3 mmol/L), you have very low blood sugar (severe hypoglycemia). This is an emergency. Do not wait to see if the symptoms will go away. Get medical help right away. Call your local emergency services (911 in the [place] Do not drive yourself to the hospital. Questions to ask your health care provider  Do I need to meet with a diabetes educator?  What equipment will I need to care for myself at  home?  What diabetes medicines do I need? When should I take them?  How often do I need to check my blood sugar?  What number can I call if I have questions?  When is my next doctor's visit?  Where can I find a support group for people with diabetes? Where to find more information  American Diabetes Association: www.diabetes.org  American Association of Diabetes Educators: www.diabeteseducator.org/patient-resources Contact a doctor if:  Your blood sugar is at or above 240 mg/dL (13.3 mmol/L) for 2 days in a row.  You have been sick or have had a fever for 2 days or more, and you are not getting better.  You have any of these problems for more than 6 hours: ? You cannot eat or drink. ? You feel sick to your stomach (nauseous). ? You throw up (vomit). ? You have watery poop (diarrhea). Get help right away if:  Your blood sugar is lower than 54 mg/dL (3 mmol/L).  You get confused.  You have trouble: ? Thinking clearly. ? Breathing. Summary  Diabetes (diabetes mellitus) is a long-term (chronic) disease. It occurs when the body does not properly use sugar (glucose) that is released from food after digestion.  Take insulin and diabetes medicines as told.  Check your blood sugar every day, as often as told.  Keep all follow-up visits as told by your doctor. This is important. This information is not intended to replace advice given to you by your health care provider. Make sure you discuss any questions you have with your health care provider. Document Revised: 10/12/2018 Document Reviewed: 04/23/2017 Elsevier Patient Education  [place].  Diabetes Mellitus and Exercise Exercising regularly is important for your overall health, especially when you have diabetes (diabetes mellitus). Exercising is not only about losing weight. It has many other health benefits, such as increasing muscle strength and bone density and reducing body fat and stress. This leads to  improved fitness, flexibility, and endurance, all of which result in better overall health. Exercise has additional benefits for people with diabetes, including:  Reducing appetite.  Helping to lower and control blood glucose.  Lowering blood pressure.  Helping to control amounts of fatty substances (lipids) in the blood, such as cholesterol and triglycerides.  Helping the body to respond better to insulin (improving insulin sensitivity).  Reducing how much insulin the body needs.  Decreasing the risk for heart disease by: ? Lowering cholesterol and triglyceride levels. ? Increasing the levels of good cholesterol. ? Lowering blood glucose levels. What is my activity plan? Your health care provider or certified diabetes educator can help you make a plan for the type and frequency of exercise (activity plan) that works for you. Make sure that you:  Do at least 150 minutes of moderate-intensity or vigorous-intensity exercise each week. This could be brisk walking, biking, or water aerobics. ? Do stretching and strength exercises, such as yoga or weightlifting, at least 2 times a week. ? Spread out your activity over at least 3 days of the week.  Get some form of physical activity every day. ? Do not go more than 2 days in a row without some kind of physical activity. ? Avoid being inactive for more than 30 minutes at a time. Take frequent breaks to walk or stretch.  Choose a type of exercise or activity that you enjoy, and set realistic goals.  Start slowly, and gradually increase the intensity of your exercise over time. What do I need to know about managing my diabetes?   Check your blood glucose before and after exercising. ? If your blood glucose is 240 mg/dL (13.3 mmol/L) or higher before you exercise, check your urine for ketones. If you have ketones in your urine, do not exercise until your blood glucose returns to normal. ? If your blood glucose is 100 mg/dL (5.6 mmol/L) or  lower, eat a snack containing 15-20 grams of carbohydrate. Check your blood glucose 15 minutes after the snack to make sure that your level is above 100 mg/dL (5.6 mmol/L) before you start your exercise.  Know the symptoms of low blood glucose (hypoglycemia) and how to treat it. Your risk for hypoglycemia increases during and after exercise. Common symptoms of hypoglycemia can include: ? Hunger. ? Anxiety. ? Sweating and feeling clammy. ? Confusion. ? Dizziness or feeling light-headed. ? Increased heart rate or palpitations. ? Blurry vision. ? Tingling or numbness around the mouth, lips, or tongue. ? Tremors or shakes. ? Irritability.  Keep a rapid-acting carbohydrate snack available before, during, and after exercise to help prevent or treat hypoglycemia.  Avoid injecting insulin into areas of the body that are going to be exercised. For example, avoid injecting insulin into: ? The arms, when playing tennis. ? The legs, when jogging.  Keep records of your exercise habits. Doing this can help you and your health care provider adjust your diabetes management plan as needed. Write down: ? Food that you eat before and after you exercise. ? Blood glucose levels before and after you exercise. ? The type and amount of exercise you have done. ? When your insulin is expected to peak, if you use insulin. Avoid exercising at times when your insulin is peaking.  When you start a new exercise or activity, work with your health care provider to make sure the activity is safe for you, and to adjust your insulin, medicines, or food intake as needed.  Drink plenty of water while you exercise to prevent dehydration or heat stroke. Drink enough fluid to keep your urine clear or pale yellow. Summary  Exercising regularly is important for your overall health, especially when you have diabetes (diabetes mellitus).  Exercising has many health benefits, such as increasing muscle strength and bone density  and reducing body fat and stress.  Your health care provider or certified diabetes educator can help you make a plan for the type and frequency of exercise (activity plan) that works for you.  When you start a new exercise or activity, work with your health care provider to make sure the activity is safe for you, and to adjust your insulin, medicines, or food intake as needed. This information is not intended to replace advice given to you by your health care provider. Make sure you discuss any questions you have with your health care provider. Document Revised: 08/13/2016 Document Reviewed: 07/01/2015 Elsevier Patient Education  [place].

## 2019-05-01 NOTE — Telephone Encounter (Signed)
Is 2 capsules the correct dosage, please advise.

## 2019-05-02 LAB — HEPATITIS C ANTIBODY: Hep C Virus Ab: <0.1 s/co ratio (ref 0.0–0.9)

## 2019-05-02 LAB — HIV ANTIBODY (ROUTINE TESTING W REFLEX): HIV Screen 4th Generation wRfx: NONREACTIVE

## 2019-05-05 ENCOUNTER — Other Ambulatory Visit (HOSPITAL_COMMUNITY): Payer: 59

## 2019-05-09 ENCOUNTER — Ambulatory Visit: Admission: RE | Admit: 2019-05-09 | Payer: 59 | Source: Home / Self Care | Admitting: Orthopedic Surgery

## 2019-05-09 SURGERY — ARTHROPLASTY, KNEE, TOTAL
Anesthesia: Spinal | Site: Knee | Laterality: Left

## 2019-05-19 LAB — BASIC METABOLIC PANEL WITH GFR
BUN/Creatinine Ratio: 24 (ref 10–24)
BUN: 20 mg/dL (ref 8–27)
CO2: 22 mmol/L (ref 20–29)
Calcium: 9.8 mg/dL (ref 8.6–10.2)
Chloride: 104 mmol/L (ref 96–106)
Creatinine, Ser: 0.85 mg/dL (ref 0.76–1.27)
GFR calc Af Amer: 106 mL/min/1.73
GFR calc non Af Amer: 91 mL/min/1.73
Glucose: 134 mg/dL — ABNORMAL HIGH (ref 65–99)
Potassium: 4.7 mmol/L (ref 3.5–5.2)
Sodium: 142 mmol/L (ref 134–144)

## 2019-05-22 ENCOUNTER — Other Ambulatory Visit: Payer: Self-pay | Admitting: Surgical

## 2019-05-24 ENCOUNTER — Inpatient Hospital Stay: Payer: 59 | Admitting: Orthopedic Surgery

## 2019-06-01 ENCOUNTER — Encounter: Payer: Self-pay | Admitting: *Deleted

## 2019-06-01 ENCOUNTER — Telehealth: Payer: Self-pay | Admitting: Orthopedic Surgery

## 2019-06-01 ENCOUNTER — Other Ambulatory Visit: Payer: Self-pay

## 2019-06-01 ENCOUNTER — Ambulatory Visit (AMBULATORY_SURGERY_CENTER): Payer: Self-pay | Admitting: *Deleted

## 2019-06-01 VITALS — Temp 96.9°F | Ht 68.0 in | Wt 243.0 lb

## 2019-06-01 DIAGNOSIS — Z1211 Encounter for screening for malignant neoplasm of colon: Secondary | ICD-10-CM

## 2019-06-01 DIAGNOSIS — Z01818 Encounter for other preprocedural examination: Secondary | ICD-10-CM

## 2019-06-01 MED ORDER — SUTAB 1479-225-188 MG PO TABS: 1.0000 | ORAL_TABLET | ORAL | 0 refills | Status: DC

## 2019-06-01 NOTE — Progress Notes (Signed)
Patient is here in-person for PV. Patient denies any allergies to eggs or soy. Patient denies any problems with anesthesia/sedation. Patient denies any oxygen use at home. Patient denies taking any diet/weight loss medications or blood thinners. Patient is not being treated for MRSA or C-diff. EMMI education assisgned to the patient for the procedure, this was explained and instructions given to patient. COVID-19 screening test is on 5/17, the pt is aware. Pt is out of town the entire week before colon, he request the Monday before, pt is aware he will be cancelled if covid test is +.  Patient is aware of our care-partner policy and 0000000 safety protocol.   Pt request pill prep, Sutab instructions given.  Prep Prescription coupon given to the patient.

## 2019-06-01 NOTE — Telephone Encounter (Signed)
Pt called in stating he needs a work note explaining why he's out and how long he'll be under restrictions; pt stated that he would like the note emailed.   [email or web address] [phone]

## 2019-06-02 NOTE — Telephone Encounter (Signed)
Patient states he is out of work currently because he's in pain and can't walk. His A1C is too high, reason why we are delaying surgery, and they need documentation of this

## 2019-06-02 NOTE — Telephone Encounter (Signed)
Please advise 

## 2019-06-05 NOTE — Telephone Encounter (Signed)
Note emailed to provided email address

## 2019-06-14 ENCOUNTER — Ambulatory Visit: Payer: 59 | Admitting: Cardiology

## 2019-06-14 ENCOUNTER — Encounter: Payer: Self-pay | Admitting: Gastroenterology

## 2019-06-15 ENCOUNTER — Other Ambulatory Visit: Payer: Self-pay | Admitting: Surgical

## 2019-06-15 NOTE — Telephone Encounter (Signed)
Please advise 

## 2019-06-19 ENCOUNTER — Other Ambulatory Visit: Payer: Self-pay | Admitting: Gastroenterology

## 2019-06-19 ENCOUNTER — Ambulatory Visit (INDEPENDENT_AMBULATORY_CARE_PROVIDER_SITE_OTHER): Payer: 59

## 2019-06-19 DIAGNOSIS — Z1159 Encounter for screening for other viral diseases: Secondary | ICD-10-CM

## 2019-06-20 ENCOUNTER — Encounter: Payer: Self-pay | Admitting: Gastroenterology

## 2019-06-20 ENCOUNTER — Other Ambulatory Visit: Payer: Self-pay

## 2019-06-20 ENCOUNTER — Ambulatory Visit (AMBULATORY_SURGERY_CENTER): Payer: 59 | Admitting: Gastroenterology

## 2019-06-20 VITALS — BP 147/76 | HR 75 | Temp 96.2°F | Resp 13 | Ht 68.0 in | Wt 243.0 lb

## 2019-06-20 DIAGNOSIS — K635 Polyp of colon: Secondary | ICD-10-CM | POA: Diagnosis not present

## 2019-06-20 DIAGNOSIS — Z1211 Encounter for screening for malignant neoplasm of colon: Secondary | ICD-10-CM | POA: Diagnosis not present

## 2019-06-20 DIAGNOSIS — D127 Benign neoplasm of rectosigmoid junction: Secondary | ICD-10-CM

## 2019-06-20 DIAGNOSIS — D122 Benign neoplasm of ascending colon: Secondary | ICD-10-CM

## 2019-06-20 LAB — SARS CORONAVIRUS 2 (TAT 6-24 HRS): SARS Coronavirus 2: NEGATIVE

## 2019-06-20 MED ORDER — SODIUM CHLORIDE 0.9 % IV SOLN: 500.0000 mL | INTRAVENOUS | Status: DC

## 2019-06-20 NOTE — Progress Notes (Signed)
Called to room to assist during endoscopic procedure.  Patient ID and intended procedure confirmed with present staff. Received instructions for my participation in the procedure from the performing physician.  

## 2019-06-20 NOTE — Patient Instructions (Signed)
Handouts on polyps, hemorrhoids, and high fiber diet given to you today  Await pathology results  Follow up in GI clinic in the next few weeks to discuss removal of larger polyp    YOU HAD AN ENDOSCOPIC PROCEDURE TODAY AT [facility]:   Refer to the procedure report that was given to you for any specific questions about what was found during the examination.  If the procedure report does not answer your questions, please call your gastroenterologist to clarify.  If you requested that your care partner not be given the details of your procedure findings, then the procedure report has been included in a sealed envelope for you to review at your convenience later.  YOU SHOULD EXPECT: Some feelings of bloating in the abdomen. Passage of more gas than usual.  Walking can help get rid of the air that was put into your GI tract during the procedure and reduce the bloating. If you had a lower endoscopy (such as a colonoscopy or flexible sigmoidoscopy) you may notice spotting of blood in your stool or on the toilet paper. If you underwent a bowel prep for your procedure, you may not have a normal bowel movement for a few days.  Please Note:  You might notice some irritation and congestion in your nose or some drainage.  This is from the oxygen used during your procedure.  There is no need for concern and it should clear up in a day or so.  SYMPTOMS TO REPORT IMMEDIATELY:   Following lower endoscopy (colonoscopy or flexible sigmoidoscopy):  Excessive amounts of blood in the stool  Significant tenderness or worsening of abdominal pains  Swelling of the abdomen that is new, acute  Fever of 100F or higher  For urgent or emergent issues, a gastroenterologist can be reached at any hour by calling [phone]. Do not use MyChart messaging for urgent concerns.    DIET:  We do recommend a small meal at first, but then you may proceed to your regular diet.  Drink plenty of fluids but you  should avoid alcoholic beverages for 24 hours.  ACTIVITY:  You should plan to take it easy for the rest of today and you should NOT DRIVE or use heavy machinery until tomorrow (because of the sedation medicines used during the test).    FOLLOW UP: Our staff will call the number listed on your records 48-72 hours following your procedure to check on you and address any questions or concerns that you may have regarding the information given to you following your procedure. If we do not reach you, we will leave a message.  We will attempt to reach you two times.  During this call, we will ask if you have developed any symptoms of COVID 19. If you develop any symptoms (ie: fever, flu-like symptoms, shortness of breath, cough etc.) before then, please call [phone].  If you test positive for Covid 19 in the 2 weeks post procedure, please call and report this information to [place].    If any biopsies were taken you will be contacted by phone or by letter within the next 1-3 weeks.  Please call us at [phone] if you have not heard about the biopsies in 3 weeks.    SIGNATURES/CONFIDENTIALITY: You and/or your care partner have signed paperwork which will be entered into your electronic medical record.  These signatures attest to the fact that that the information above on your After Visit Summary has been reviewed and is  understood.  Full responsibility of the confidentiality of this discharge information lies with you and/or your care-partner. 

## 2019-06-20 NOTE — Progress Notes (Signed)
Report to PACU, RN, vss, BBS= Clear.  

## 2019-06-20 NOTE — Op Note (Signed)
[place] Patient Name: Joe Stewart Procedure Date: 06/20/2019 9:05 AM MRN: [id number] Endoscopist: Justice Britain , MD Age: 64 Referring MD:  Date of Birth: 1955-02-12 Gender: Male Account #: [id number] Procedure:                Colonoscopy Indications:              Screening for colorectal malignant neoplasm Medicines:                Monitored Anesthesia Care Procedure:                Pre-Anesthesia Assessment:                           - Prior to the procedure, a History and Physical                            was performed, and patient medications and                            allergies were reviewed. The patient's tolerance of                            previous anesthesia was also reviewed. The risks                            and benefits of the procedure and the sedation                            options and risks were discussed with the patient.                            All questions were answered, and informed consent                            was obtained. Prior Anticoagulants: The patient has                            taken no previous anticoagulant or antiplatelet                            agents. ASA Grade Assessment: II - A patient with                            mild systemic disease. After reviewing the risks                            and benefits, the patient was deemed in                            satisfactory condition to undergo the procedure.                           After obtaining informed consent, the colonoscope  was passed under direct vision. Throughout the                            procedure, the patient's blood pressure, pulse, and                            oxygen saturations were monitored continuously. The                            Colonoscope was introduced through the anus and                            advanced to the the cecum, identified by                            appendiceal orifice and  ileocecal valve. The                            colonoscopy was performed without difficulty. The                            patient tolerated the procedure. The quality of the                            bowel preparation was adequate. The ileocecal                            valve, appendiceal orifice, and rectum were                            photographed. Scope In: 9:13:51 AM Scope Out: 9:31:42 AM Scope Withdrawal Time: 0 hours 13 minutes 38 seconds  Total Procedure Duration: 0 hours 17 minutes 51 seconds  Findings:                 The digital rectal exam findings include                            hemorrhoids. Pertinent negatives include no                            palpable rectal lesions.                           A moderate amount of stool was found in the entire                            colon, interfering with visualization. Lavage of                            the area was performed using copious amounts,                            resulting in clearance with adequate visualization.  A 35 mm polyp was found in the cecum. The polyp was                            semi-sessile. Polypectomy was not attempted due to                            need for EMR in the hospital-based setting.                           Three sessile polyps were found in the                            recto-sigmoid colon (1) and ascending colon (2).                            The polyps were 3 to 5 mm in size. These polyps                            were removed with a cold snare. Resection and                            retrieval were complete.                           Normal mucosa was found in the entire colon                            otherwise.                           Non-bleeding non-thrombosed external and internal                            hemorrhoids were found during retroflexion, during                            perianal exam and during digital exam. The                             hemorrhoids were Grade II (internal hemorrhoids                            that prolapse but reduce spontaneously). Complications:            No immediate complications. Estimated Blood Loss:     Estimated blood loss was minimal. Impression:               - Hemorrhoids found on digital rectal exam.                           - Stool in the entire examined colon. Lavaged                            copiously with adequate visualization.                           -  One 35 mm polyp in the cecum. Resection not                            attempted.                           - Three 3 to 5 mm polyps at the recto-sigmoid colon                            and in the ascending colon, removed with a cold                            snare. Resected and retrieved.                           - Normal mucosa in the entire examined colon                            otherwise.                           - Non-bleeding non-thrombosed external and internal                            hemorrhoids. Recommendation:           - The patient will be observed post-procedure,                            until all discharge criteria are met.                           - Discharge patient to home.                           - Patient has a contact number available for                            emergencies. The signs and symptoms of potential                            delayed complications were discussed with the                            patient. Return to normal activities tomorrow.                            Written discharge instructions were provided to the                            patient.                           - High fiber diet.                           - Continue present  medications.                           - Await pathology results.                           - Plan for Clinic visit in next few weeks to                            discuss EMR and attempt at resection of the larger                             polyp with repeat colonoscopy within next 3-4                            months.                           - Recommend 1-week of once daily Miralax in                            anticipation of procedure with same preparation                            otherwise to optimize views.                           - The findings and recommendations were discussed                            with the patient.                           - The findings and recommendations were discussed                            with the patient's family. Justice Britain, MD 06/20/2019 9:41:17 AM

## 2019-06-20 NOTE — Progress Notes (Signed)
Temp JB  v/s CW  I have reviewed the patient's medical history in detail and updated the computerized patient record.

## 2019-06-21 ENCOUNTER — Ambulatory Visit: Payer: 59 | Admitting: Cardiology

## 2019-06-22 ENCOUNTER — Telehealth: Payer: Self-pay

## 2019-06-22 NOTE — Telephone Encounter (Signed)
  Follow up Call-  Call back number 06/20/2019  Post procedure Call Back phone  # [phone]  Permission to leave phone message Yes  Some recent data might be hidden     Patient questions:  Do you have a fever, pain , or abdominal swelling? No. Pain Score  0 *  Have you tolerated food without any problems? Yes.    Have you been able to return to your normal activities? Yes.    Do you have any questions about your discharge instructions: Diet   No. Medications  No. Follow up visit  No.  Do you have questions or concerns about your Care? No.  Actions: * If pain score is 4 or above: No action needed, pain <4.  1. Have you developed a fever since your procedure? no  2.   Have you had an respiratory symptoms (SOB or cough) since your procedure? no  3.   Have you tested positive for COVID 19 since your procedure no  4.   Have you had any family members/close contacts diagnosed with the COVID 19 since your procedure?  no   If yes to any of these questions please route to Joylene John, RN and Erenest Rasher, RN

## 2019-06-25 ENCOUNTER — Encounter: Payer: Self-pay | Admitting: Gastroenterology

## 2019-07-07 ENCOUNTER — Encounter: Payer: Self-pay | Admitting: Registered Nurse

## 2019-07-07 ENCOUNTER — Ambulatory Visit (INDEPENDENT_AMBULATORY_CARE_PROVIDER_SITE_OTHER): Payer: 59 | Admitting: Registered Nurse

## 2019-07-07 ENCOUNTER — Other Ambulatory Visit: Payer: Self-pay

## 2019-07-07 VITALS — BP 129/71 | HR 100 | Temp 97.6°F | Resp 17 | Ht 68.0 in | Wt 232.2 lb

## 2019-07-07 DIAGNOSIS — E119 Type 2 diabetes mellitus without complications: Secondary | ICD-10-CM | POA: Diagnosis not present

## 2019-07-07 LAB — POCT GLYCOSYLATED HEMOGLOBIN (HGB A1C): Hemoglobin A1C: 7.4 % — AB (ref 4.0–5.6)

## 2019-07-07 NOTE — Patient Instructions (Signed)
° ° ° °  If you have lab work done today you will be contacted with your lab results within the next 2 weeks.  If you have not heard from us then please contact us. The fastest way to get your results is to register for My Chart. ° ° °IF you received an x-ray today, you will receive an invoice from [facility]. Please contact [facility] at [phone] with questions or concerns regarding your invoice.  ° °IF you received labwork today, you will receive an invoice from [facility]. Please contact [facility] at [phone] with questions or concerns regarding your invoice.  ° °Our billing staff will not be able to assist you with questions regarding bills from these companies. ° °You will be contacted with the lab results as soon as they are available. The fastest way to get your results is to activate your My Chart account. Instructions are located on the last page of this paperwork. If you have not heard from us regarding the results in 2 weeks, please contact this office. °  ° ° ° °

## 2019-07-07 NOTE — Progress Notes (Signed)
Established Patient Office Visit  Subjective:  Patient ID: Joe Stewart, male    DOB: May 16, 1955  Age: 64 y.o. MRN: [id number]  CC:  Chief Complaint  Patient presents with  . Follow-up    patient has a follow up for diabetes . Per patient he has no questions or concerns     HPI Joe Stewart presents for t2dm follow up Reports good med compliance Feeling well Been very conscious of diet - limiting processed foods and added sugars. Having a difficult time exercising with his knee pain - hoping to have TKA as soon as possible.  Past Medical History:  Diagnosis Date  . Arthritis   . Diabetes mellitus without complication ([place])   . Hyperlipidemia   . Hypertension   . Obesity     Past Surgical History:  Procedure Laterality Date  . HERNIA REPAIR    . HERNIA REPAIR     35 years ago  . LEFT HEART CATH AND CORONARY ANGIOGRAPHY N/A 04/11/2019   Procedure: LEFT HEART CATH AND CORONARY ANGIOGRAPHY;  Surgeon: Adrian Prows, MD;  Location: [facility];  Service: Cardiovascular;  Laterality: N/A;  . RENAL ANGIOGRAPHY Bilateral 04/11/2019   Procedure: RENAL ANGIOGRAPHY;  Surgeon: Adrian Prows, MD;  Location: [facility];  Service: Cardiovascular;  Laterality: Bilateral;    Family History  Problem Relation Age of Onset  . Cancer Mother   . Diabetes Mother   . COPD Mother   . Heart disease Father   . COPD Sister   . Healthy Brother   . Heart disease Brother   . Healthy Son   . Colon cancer Neg Hx   . Colon polyps Neg Hx   . Esophageal cancer Neg Hx   . Rectal cancer Neg Hx   . Stomach cancer Neg Hx     Social History   Socioeconomic History  . Marital status: Single    Spouse name: Not on file  . Number of children: 1  . Years of education: Not on file  . Highest education level: Not on file  Occupational History  . Not on file  Tobacco Use  . Smoking status: Never Smoker  . Smokeless tobacco: Never Used  Substance and Sexual Activity  . Alcohol use: Not  Currently  . Drug use: Never  . Sexual activity: Not Currently  Other Topics Concern  . Not on file  Social History Narrative  . Not on file   Social Determinants of Health   Financial Resource Strain:   . Difficulty of Paying Living Expenses:   Food Insecurity:   . Worried About Charity fundraiser in the Last Year:   . Arboriculturist in the Last Year:   Transportation Needs:   . Film/video editor (Medical):   Marland Kitchen Lack of Transportation (Non-Medical):   Physical Activity:   . Days of Exercise per Week:   . Minutes of Exercise per Session:   Stress:   . Feeling of Stress :   Social Connections:   . Frequency of Communication with Friends and Family:   . Frequency of Social Gatherings with Friends and Family:   . Attends Religious Services:   . Active Member of Clubs or Organizations:   . Attends Archivist Meetings:   Marland Kitchen Marital Status:   Intimate Partner Violence:   . Fear of Current or Ex-Partner:   . Emotionally Abused:   Marland Kitchen Physically Abused:   . Sexually Abused:  Outpatient Medications Prior to Visit  Medication Sig Dispense Refill  . amLODipine (NORVASC) 10 MG tablet Take 1 tablet (10 mg total) by mouth in the morning. 90 tablet 1  . celecoxib (CELEBREX) 200 MG capsule TAKE 1 CAPSULE (200 MG TOTAL) BY MOUTH 2 (TWO) TIMES DAILY. ADDITIONALLY, TAKE '400MG'$  (2 CAPSULES) BY MOUTH THE MORNING OF SURGERY AS SOON AS YOU WAKE UP WITH A SMALL SIP OF WATER. NO FOOD. 60 capsule 0  . hydrochlorothiazide (HYDRODIURIL) 25 MG tablet Take 25 mg by mouth every morning.    Marland Kitchen losartan (COZAAR) 25 MG tablet Take 1 tablet (25 mg total) by mouth every evening. 90 tablet 3  . metFORMIN (GLUCOPHAGE) 1000 MG tablet Take 1 tablet (1,000 mg total) by mouth 2 (two) times daily with a meal. 180 tablet 3  . metoprolol succinate (TOPROL-XL) 25 MG 24 hr tablet Take 1 tablet (25 mg total) by mouth daily. Take with or immediately following a meal. Hold if systolic blood pressure (top  blood pressure number) less than 100 mmHg or heart rate less than 60 bpm (pulse). 90 tablet 1  . Sodium Sulfate-Mag Sulfate-KCl (SUTAB) [phone] MG TABS Take 1 kit by mouth as directed. 24 tablet 0  . atorvastatin (LIPITOR) 40 MG tablet Take 1 tablet (40 mg total) by mouth daily. 90 tablet 3   No facility-administered medications prior to visit.    No Known Allergies  ROS Review of Systems  Constitutional: Negative.   HENT: Negative.   Eyes: Negative.   Respiratory: Negative.   Cardiovascular: Negative.   Gastrointestinal: Negative.   Endocrine: Negative.   Genitourinary: Negative.   Musculoskeletal: Positive for arthralgias. Negative for back pain, gait problem, joint swelling, myalgias, neck pain and neck stiffness.  Skin: Negative.   Allergic/Immunologic: Negative.   Neurological: Negative.   Hematological: Negative.   Psychiatric/Behavioral: Negative.   All other systems reviewed and are negative.     Objective:    Physical Exam  Constitutional: He is oriented to person, place, and time. He appears well-developed and well-nourished. No distress.  Cardiovascular: Normal rate and regular rhythm.  Pulmonary/Chest: Effort normal. No respiratory distress.  Neurological: He is alert and oriented to person, place, and time.  Skin: Skin is warm and dry. No rash noted. He is not diaphoretic. No erythema. No pallor.  Psychiatric: He has a normal mood and affect. His behavior is normal. Judgment and thought content normal.  Nursing note and vitals reviewed.   BP 129/71   Pulse 100   Temp 97.6 F (36.4 C) (Temporal)   Resp 17   Ht '5\' 8"'$  (1.727 m)   Wt 232 lb 3.2 oz (105.3 kg)   SpO2 98%   BMI 35.31 kg/m  Wt Readings from Last 3 Encounters:  07/07/19 232 lb 3.2 oz (105.3 kg)  06/20/19 243 lb (110.2 kg)  06/01/19 243 lb (110.2 kg)     Health Maintenance Due  Topic Date Due  . OPHTHALMOLOGY EXAM  Never done  . COVID-19 Vaccine (1) Never done    There are no  preventive care reminders to display for this patient.  No results found for: TSH Lab Results  Component Value Date   WBC 7.3 04/26/2019   HGB 13.8 04/26/2019   HCT 41.8 04/26/2019   MCV 90.9 04/26/2019   PLT 189 04/26/2019   Lab Results  Component Value Date   NA 142 05/18/2019   K 4.7 05/18/2019   CO2 22 05/18/2019   GLUCOSE 134 (H) 05/18/2019  BUN 20 05/18/2019   CREATININE 0.85 05/18/2019   CALCIUM 9.8 05/18/2019   ANIONGAP 12 04/26/2019   Lab Results  Component Value Date   CHOL 146 04/07/2019   Lab Results  Component Value Date   HDL 37 (L) 04/07/2019   Lab Results  Component Value Date   LDLCALC 81 04/07/2019   Lab Results  Component Value Date   TRIG 163 (H) 04/07/2019   No results found for: CHOLHDL Lab Results  Component Value Date   HGBA1C 7.4 (A) 07/07/2019      Assessment & Plan:   Problem List Items Addressed This Visit      Endocrine   Type 2 diabetes mellitus without complication, without long-term current use of insulin ([place]) - Primary   Relevant Orders   HM Diabetes Foot Exam (Completed)   Ambulatory referral to Ophthalmology   POCT glycosylated hemoglobin (Hb A1C) (Completed)      No orders of the defined types were placed in this encounter.   Follow-up: No follow-ups on file.   PLAN  A1c drastically improved to 7.4  Will contact ortho to discuss his candidacy for TKA operation  Return prn - may need repeat pre op labs, these can be done as lab only visit  Patient encouraged to call clinic with any questions, comments, or concerns.  [name], NP

## 2019-07-24 ENCOUNTER — Ambulatory Visit: Payer: 59 | Admitting: Cardiology

## 2019-07-25 ENCOUNTER — Other Ambulatory Visit: Payer: Self-pay

## 2019-07-26 NOTE — Pre-Procedure Instructions (Signed)
[id number] [place], [place] [address] Phone: [phone] Fax: [phone]     Your procedure is scheduled on Tuesday, June 29 from 07:30 AM- 10:26 AM.  Report to [facility] Main Entrance "A" at 05:30 A.M., and check in at the Admitting office.  Call this number if you have problems the morning of surgery:  [phone]  Call [phone] if you have any questions prior to your surgery date Monday-Friday 8am-4pm.    Remember:  Do not eat after midnight the night before your surgery.  You may drink clear liquids until 04:30 AM the morning of your surgery.    Clear liquids allowed are: Water, Non-Citrus Juices (without pulp), Carbonated Beverages, Clear Tea, Black Coffee Only, and Gatorade.    Enhanced Recovery after Surgery for Orthopedics Enhanced Recovery after Surgery is a protocol used to improve the stress on your body and your recovery after surgery.  Patient Instructions  . The night before surgery:  o No food after midnight. ONLY clear liquids after midnight   . The day of surgery (if you have diabetes): o  o Drink ONE (1) Gatorade 2 (G2) by 04:30 AM. o This drink was given to you during your hospital  pre-op appointment visit.  o Color of the Gatorade may vary. Red is not allowed. o Nothing else to drink after completing the  Gatorade 2 (G2).         If you have questions, please contact your surgeon's office.    Take these medicines the morning of surgery with A SIP OF WATER: amLODipine (NORVASC)   As of today, STOP taking any Aspirin (unless otherwise instructed by your surgeon) and Aspirin containing products, Aleve, Naproxen, Ibuprofen, Motrin, Advil, Goody's, BC's, all herbal medications, fish oil, and all vitamins.    WHAT DO I DO ABOUT MY DIABETES MEDICATION?   Marland Kitchen Do not take metFORMIN (GLUCOPHAGE) the morning of surgery.   HOW TO MANAGE YOUR  DIABETES BEFORE AND AFTER SURGERY  Why is it important to control my blood sugar before and after surgery? . Improving blood sugar levels before and after surgery helps healing and can limit problems. . A way of improving blood sugar control is eating a healthy diet by: o  Eating less sugar and carbohydrates o  Increasing activity/exercise o  Talking with your doctor about reaching your blood sugar goals . High blood sugars (greater than 180 mg/dL) can raise your risk of infections and slow your recovery, so you will need to focus on controlling your diabetes during the weeks before surgery. . Make sure that the doctor who takes care of your diabetes knows about your planned surgery including the date and location.  How do I manage my blood sugar before surgery? . Check your blood sugar at least 4 times a day, starting 2 days before surgery, to make sure that the level is not too high or low. . Check your blood sugar the morning of your surgery when you wake up and every 2 hours until you get to the Short Stay unit. o If your blood sugar is less than 70 mg/dL, you will need to treat for low blood sugar: - Do not take insulin. - Treat a low blood sugar (less than 70 mg/dL) with  cup of clear juice (cranberry or apple), 4 glucose tablets, OR glucose gel. - Recheck blood sugar in 15 minutes after treatment (to make sure it  is greater than 70 mg/dL). If your blood sugar is not greater than 70 mg/dL on recheck, call [phone] for further instructions. . Report your blood sugar to the short stay nurse when you get to Short Stay.  . If you are admitted to the hospital after surgery: o Your blood sugar will be checked by the staff and you will probably be given insulin after surgery (instead of oral diabetes medicines) to make sure you have good blood sugar levels. o The goal for blood sugar control after surgery is 80-180 mg/dL.            The Morning of Surgery:            Do not wear  jewelry.            Do not wear lotions, powders, colognes, or deodorant.            Men may shave face and neck.            Do not bring valuables to the hospital.            [facility] is not responsible for any belongings or valuables.  Do NOT Smoke (Tobacco/Vapping) or drink Alcohol 24 hours prior to your procedure.  If you use a CPAP at night, you may bring all equipment for your overnight stay.   Contacts, glasses, dentures or bridgework may not be worn into surgery.      For patients admitted to the hospital, discharge time will be determined by your treatment team.   Patients discharged the day of surgery will not be allowed to drive home, and someone needs to stay with them for 24 hours.    Special instructions:   [place]- Preparing For Surgery  Before surgery, you can play an important role. Because skin is not sterile, your skin needs to be as free of germs as possible. You can reduce the number of germs on your skin by washing with CHG (chlorahexidine gluconate) Soap before surgery.  CHG is an antiseptic cleaner which kills germs and bonds with the skin to continue killing germs even after washing.    Oral Hygiene is also important to reduce your risk of infection.  Remember - BRUSH YOUR TEETH THE MORNING OF SURGERY WITH YOUR REGULAR TOOTHPASTE  Please do not use if you have an allergy to CHG or antibacterial soaps. If your skin becomes reddened/irritated stop using the CHG.  Do not shave (including legs and underarms) for at least 48 hours prior to first CHG shower. It is OK to shave your face.  Please follow these instructions carefully.   1. Shower the NIGHT BEFORE SURGERY and the MORNING OF SURGERY with CHG Soap.   2. If you chose to wash your hair, wash your hair first as usual with your normal shampoo.  3. After you shampoo, rinse your hair and body thoroughly to remove the shampoo.  4. Use CHG as you would any other liquid soap. You can apply CHG directly  to the skin and wash gently with a scrungie or a clean washcloth.   5. Apply the CHG Soap to your body ONLY FROM THE NECK DOWN.  Do not use on open wounds or open sores. Avoid contact with your eyes, ears, mouth and genitals (private parts). Wash Face and genitals (private parts)  with your normal soap.   6. Wash thoroughly, paying special attention to the area where your surgery will be performed.  7. Thoroughly rinse your body  with warm water from the neck down.  8. DO NOT shower/wash with your normal soap after using and rinsing off the CHG Soap.  9. Pat yourself dry with a CLEAN TOWEL.  10. Wear CLEAN PAJAMAS to bed the night before surgery, wear comfortable clothes the morning of surgery  11. Place CLEAN SHEETS on your bed the night of your first shower and DO NOT SLEEP WITH PETS.   Day of Surgery: Shower with CHG Soap.   Do not apply any deodorants/lotions.  Please wear clean clothes to the hospital/surgery center.   Remember to brush your teeth WITH YOUR REGULAR TOOTHPASTE.   Please read over the following fact sheets that you were given.

## 2019-07-26 NOTE — Progress Notes (Signed)
[facility] Dr. Marlou Sa for procedure orders.

## 2019-07-27 ENCOUNTER — Encounter (HOSPITAL_COMMUNITY): Payer: Self-pay

## 2019-07-27 ENCOUNTER — Other Ambulatory Visit: Payer: Self-pay

## 2019-07-27 ENCOUNTER — Encounter (HOSPITAL_COMMUNITY)
Admission: RE | Admit: 2019-07-27 | Discharge: 2019-07-27 | Disposition: A | Discharge status: Home / Self Care | Payer: 59 | Source: Ambulatory Visit | Attending: Orthopedic Surgery | Admitting: Orthopedic Surgery

## 2019-07-27 DIAGNOSIS — Z79899 Other long term (current) drug therapy: Secondary | ICD-10-CM | POA: Diagnosis not present

## 2019-07-27 DIAGNOSIS — I10 Essential (primary) hypertension: Secondary | ICD-10-CM | POA: Diagnosis not present

## 2019-07-27 DIAGNOSIS — Z7901 Long term (current) use of anticoagulants: Secondary | ICD-10-CM | POA: Insufficient documentation

## 2019-07-27 DIAGNOSIS — M1712 Unilateral primary osteoarthritis, left knee: Secondary | ICD-10-CM | POA: Insufficient documentation

## 2019-07-27 DIAGNOSIS — E785 Hyperlipidemia, unspecified: Secondary | ICD-10-CM | POA: Diagnosis not present

## 2019-07-27 DIAGNOSIS — E118 Type 2 diabetes mellitus with unspecified complications: Secondary | ICD-10-CM | POA: Insufficient documentation

## 2019-07-27 DIAGNOSIS — Z01812 Encounter for preprocedural laboratory examination: Secondary | ICD-10-CM | POA: Insufficient documentation

## 2019-07-27 DIAGNOSIS — Z7984 Long term (current) use of oral hypoglycemic drugs: Secondary | ICD-10-CM | POA: Insufficient documentation

## 2019-07-27 LAB — SURGICAL PCR SCREEN
MRSA, PCR: NEGATIVE
Staphylococcus aureus: NEGATIVE

## 2019-07-27 LAB — BASIC METABOLIC PANEL
Anion gap: 14 (ref 5–15)
BUN: 22 mg/dL (ref 8–23)
CO2: 22 mmol/L (ref 22–32)
Calcium: 9.7 mg/dL (ref 8.9–10.3)
Chloride: 104 mmol/L (ref 98–111)
Creatinine, Ser: 1.23 mg/dL (ref 0.61–1.24)
GFR calc Af Amer: >60 mL/min (ref >60)
GFR calc non Af Amer: >60 mL/min (ref >60)
Glucose, Bld: 119 mg/dL — ABNORMAL HIGH (ref 70–99)
Potassium: 4.5 mmol/L (ref 3.5–5.1)
Sodium: 140 mmol/L (ref 135–145)

## 2019-07-27 LAB — CBC
HCT: 39.1 % (ref 39.0–52.0)
Hemoglobin: 12.6 g/dL — ABNORMAL LOW (ref 13.0–17.0)
MCH: 30.7 pg (ref 26.0–34.0)
MCHC: 32.2 g/dL (ref 30.0–36.0)
MCV: 95.1 fL (ref 80.0–100.0)
Platelets: 294 10*3/uL (ref 150–400)
RBC: 4.11 MIL/uL — ABNORMAL LOW (ref 4.22–5.81)
RDW: 13.1 % (ref 11.5–15.5)
WBC: 9.4 10*3/uL (ref 4.0–10.5)
nRBC: 0 % (ref 0.0–0.2)

## 2019-07-27 LAB — GLUCOSE, CAPILLARY: Glucose-Capillary: 118 mg/dL — ABNORMAL HIGH (ref 70–99)

## 2019-07-27 NOTE — Progress Notes (Signed)
Anesthesia Chart Review:   Case: [id number] Date/Time: 08/01/19 0715   Procedure: LEFT TOTAL KNEE ARTHROPLASTY (Left Knee)   Anesthesia type: Spinal   Pre-op diagnosis: left knee osteoarthritis   Location: [facility] OR ROOM 08 / [place] OR   Surgeons: Meredith Pel, MD      DISCUSSION:  Pt is a 64 year old with hx HTN, DM  This surgery was initially scheduled for 03/28/19, but surgery postponed for cardiology evaluation due to possible inferior ischemia on preoperative EKG.  Pt evaluated by cardiologist Rex Kras, DO at [facility] and has had a stress, echo, and cardiac cath. Anti-hypertensive medications also added. Stress test was intermediate risk with 04/11/19 LHC showing arteriomegaly with ectasia and diffuse disease related to endothelial dysfunction and microvascular disease and suggestive of hypertensive heart disease. Aggressive risk factor modification recommended. Per Dr. Terri Skains, patient "acceptable/ lowcardiac risk for the planned noncardiac surgery."  Surgery was moved to 05/09/19 but postponed again when pt newly diagnosed with DM.  Treatment initiated; A1c now 7.4 (improved from 9.6)   VS: BP (!) 161/80   Pulse 91   Temp 36.7 C (Oral)   Resp 18   Ht 5\' 8"  (1.727 m)   Wt 104.5 kg   SpO2 99%   BMI 35.03 kg/m    PROVIDERS: - PCP is [name], NP   LABS: Labs reviewed: Acceptable for surgery.  - A1c 7.4 on 07/07/19 at PCP's office   (all labs ordered are listed, but only abnormal results are displayed)  Labs Reviewed  GLUCOSE, CAPILLARY - Abnormal; Notable for the following components:      Result Value   Glucose-Capillary 118 (*)    All other components within normal limits  BASIC METABOLIC PANEL - Abnormal; Notable for the following components:   Glucose, Bld 119 (*)    All other components within normal limits  CBC - Abnormal; Notable for the following components:   RBC 4.11 (*)    Hemoglobin 12.6 (*)    All other components within normal  limits  SURGICAL PCR SCREEN    EKG 04/21/19: Sinus  Rhythm, ventricular rate 77 bpm. RBBB. ST-T changes secondary to RBBB.    CV: Left heart catheterization selective renal arteriogram 04/11/2019 Adrian Prows, MD):  The left ventricular systolic function is normal.  LV end diastolic pressure is normal.  Moderate diffuse coronary artery disease with calcification. Right coronary artery with arteriomegaly and ectasia and slow filling. Left main: Normal. LAD arteriomegaly, ectasia noted, slow filling throughout the LAD. Diffuse disease. Circumflex: Large vessel, arteriomegaly, diffuse ectasia noted. Slow filling.  Selective right and left renal arteriogram: Normal renal arteries. No evidence of renal artery stenosis.  Impression: Abnormal nuclear stress test related to severe diffuse disease and ectasia of the coronary vessels, markedly reduced blood flow especially in the right coronary artery but also in the circumflex and LAD. This is related to endothelial dysfunction and microvascular disease along with ectasia contributing to slow flow. Findings suggestive of hypertensive heart disease.  Recommendation: Patient needs aggressive risk factor modification. If he persist to have any symptoms of angina pectoris, consider low-dose Xarelto 2.5 mg twice daily to improve flow in the coronary arteries empirically. Could also try dual antiplatelet therapy for a short time. - Recommend Aspirin 81mg  daily for moderate CAD.   Lexiscan Tetrofosmin Stress Test 03/27/2019: Nondiagnostic ECG stress. Hypertensive at rest. Normal BP response to Lexiscan infusion.  Myocardial perfusion is abnormal. There is a partially reversible moderate sized mild ischemia in  the inferior and inferolateral region.  Stress LV EF: 61%.  Mild diaphragmatic attenuation noted.  Intermediate risk study. No previous exam available for comparison.   Echocardiogram 03/27/2019:  1. Normal LV systolic  function with visual EF 50-55%. Left ventricle  cavity is normal in size. Normal global wall motion. Normal diastolic  filling pattern, normal LAP. Mild left ventricular hypertrophy. Calculated  EF 50%.  2. Aortic sclerosis without stenosis.  3. No significant valvular abnormalities.  4. No prior study for comparison.     Past Medical History:  Diagnosis Date  . Arthritis   . Diabetes mellitus without complication ([place])   . Hyperlipidemia   . Hypertension   . Obesity     Past Surgical History:  Procedure Laterality Date  . EYE SURGERY Left    had to have eye flushed after getting costic sodium in L eye  . HERNIA REPAIR    . HERNIA REPAIR     35 years ago  . LEFT HEART CATH AND CORONARY ANGIOGRAPHY N/A 04/11/2019   Procedure: LEFT HEART CATH AND CORONARY ANGIOGRAPHY;  Surgeon: Adrian Prows, MD;  Location: [facility];  Service: Cardiovascular;  Laterality: N/A;  . RENAL ANGIOGRAPHY Bilateral 04/11/2019   Procedure: RENAL ANGIOGRAPHY;  Surgeon: Adrian Prows, MD;  Location: [facility];  Service: Cardiovascular;  Laterality: Bilateral;    MEDICATIONS: . amLODipine (NORVASC) 10 MG tablet  . atorvastatin (LIPITOR) 40 MG tablet  . celecoxib (CELEBREX) 200 MG capsule  . hydrochlorothiazide (HYDRODIURIL) 25 MG tablet  . losartan (COZAAR) 25 MG tablet  . metFORMIN (GLUCOPHAGE) 1000 MG tablet  . metoprolol succinate (TOPROL-XL) 25 MG 24 hr tablet  . Sodium Sulfate-Mag Sulfate-KCl (SUTAB) [phone] MG TABS   No current facility-administered medications for this encounter.    If no changes, I anticipate pt can proceed with surgery as scheduled.   Willeen Cass, FNP-BC [facility] Short Stay Surgical Center/Anesthesiology Phone: [phone] 07/27/2019 12:57 PM

## 2019-07-27 NOTE — Pre-Procedure Instructions (Addendum)
[id number] [name], [place] [address] Phone: [phone] Fax: [phone]     Your procedure is scheduled on Tuesday June 29th  Report to [facility] Main Entrance "A" at 05:30 A.M., and check in at the Admitting office.  Call this number if you have problems the morning of surgery:  [phone]  Call [phone] if you have any questions prior to your surgery date Monday-Friday 8am-4pm.    Remember:  Do not eat or drink after midnight the night before your surgery.   Take these medicines the morning of surgery with A SIP OF WATER: amLODipine (NORVASC)   As of today, STOP taking any Aspirin (unless otherwise instructed by your surgeon) and Aspirin containing products, Aleve, Naproxen, Ibuprofen, Motrin, Advil, Goody's, BC's, all herbal medications, fish oil, and all vitamins.    WHAT DO I DO ABOUT MY DIABETES MEDICATION?   Marland Kitchen Do not take metFORMIN (GLUCOPHAGE) the morning of surgery.   HOW TO MANAGE YOUR DIABETES BEFORE AND AFTER SURGERY  Why is it important to control my blood sugar before and after surgery? . Improving blood sugar levels before and after surgery helps healing and can limit problems. . A way of improving blood sugar control is eating a healthy diet by: o  Eating less sugar and carbohydrates o  Increasing activity/exercise o  Talking with your doctor about reaching your blood sugar goals . High blood sugars (greater than 180 mg/dL) can raise your risk of infections and slow your recovery, so you will need to focus on controlling your diabetes during the weeks before surgery. . Make sure that the doctor who takes care of your diabetes knows about your planned surgery including the date and location.  How do I manage my blood sugar before surgery? . Check your blood sugar at least 4 times a day, starting 2 days before surgery, to make sure that the level is  not too high or low. . Check your blood sugar the morning of your surgery when you wake up and every 2 hours until you get to the Short Stay unit. o If your blood sugar is less than 70 mg/dL, you will need to treat for low blood sugar: - Do not take insulin. - Treat a low blood sugar (less than 70 mg/dL) with  cup of clear juice (cranberry or apple), 4 glucose tablets, OR glucose gel. - Recheck blood sugar in 15 minutes after treatment (to make sure it is greater than 70 mg/dL). If your blood sugar is not greater than 70 mg/dL on recheck, call [phone] for further instructions. . Report your blood sugar to the short stay nurse when you get to Short Stay.  . If you are admitted to the hospital after surgery: o Your blood sugar will be checked by the staff and you will probably be given insulin after surgery (instead of oral diabetes medicines) to make sure you have good blood sugar levels. o The goal for blood sugar control after surgery is 80-180 mg/dL.            The Morning of Surgery:            Do not wear jewelry.            Do not wear lotions, powders, colognes, or deodorant.            Men may shave face and neck.  Do not bring valuables to the hospital.            [facility] is not responsible for any belongings or valuables.  Do NOT Smoke (Tobacco/Vapping) or drink Alcohol 24 hours prior to your procedure.  If you use a CPAP at night, you may bring all equipment for your overnight stay.   Contacts, glasses, dentures or bridgework may not be worn into surgery.      For patients admitted to the hospital, discharge time will be determined by your treatment team.   Patients discharged the day of surgery will not be allowed to drive home, and someone needs to stay with them for 24 hours.    Special instructions:   [place]- Preparing For Surgery  Before surgery, you can play an important role. Because skin is not sterile, your skin needs to be as free of  germs as possible. You can reduce the number of germs on your skin by washing with CHG (chlorahexidine gluconate) Soap before surgery.  CHG is an antiseptic cleaner which kills germs and bonds with the skin to continue killing germs even after washing.    Oral Hygiene is also important to reduce your risk of infection.  Remember - BRUSH YOUR TEETH THE MORNING OF SURGERY WITH YOUR REGULAR TOOTHPASTE  Please do not use if you have an allergy to CHG or antibacterial soaps. If your skin becomes reddened/irritated stop using the CHG.  Do not shave (including legs and underarms) for at least 48 hours prior to first CHG shower. It is OK to shave your face.  Please follow these instructions carefully.   1. Shower the NIGHT BEFORE SURGERY and the MORNING OF SURGERY with CHG Soap.   2. If you chose to wash your hair, wash your hair first as usual with your normal shampoo.  3. After you shampoo, rinse your hair and body thoroughly to remove the shampoo.  4. Use CHG as you would any other liquid soap. You can apply CHG directly to the skin and wash gently with a scrungie or a clean washcloth.   5. Apply the CHG Soap to your body ONLY FROM THE NECK DOWN.  Do not use on open wounds or open sores. Avoid contact with your eyes, ears, mouth and genitals (private parts). Wash Face and genitals (private parts)  with your normal soap.   6. Wash thoroughly, paying special attention to the area where your surgery will be performed.  7. Thoroughly rinse your body with warm water from the neck down.  8. DO NOT shower/wash with your normal soap after using and rinsing off the CHG Soap.  9. Pat yourself dry with a CLEAN TOWEL.  10. Wear CLEAN PAJAMAS to bed the night before surgery, wear comfortable clothes the morning of surgery  11. Place CLEAN SHEETS on your bed the night of your first shower and DO NOT SLEEP WITH PETS.   Day of Surgery: Shower with CHG Soap.   Do not apply any deodorants/lotions.   Please wear clean clothes to the hospital/surgery center.   Remember to brush your teeth WITH YOUR REGULAR TOOTHPASTE.   Please read over the following fact sheets that you were given.

## 2019-07-27 NOTE — Progress Notes (Signed)
   07/27/19 0927  OBSTRUCTIVE SLEEP APNEA  Have you ever been diagnosed with sleep apnea through a sleep study? No  Do you snore loudly (loud enough to be heard through closed doors)?  1  Do you often feel tired, fatigued, or sleepy during the daytime (such as falling asleep during driving or talking to someone)? 0  Has anyone observed you stop breathing during your sleep? 0  Do you have, or are you being treated for high blood pressure? 1  BMI more than 35 kg/m2? 0  Age > 50 (1-yes) 1  Neck circumference greater than:Male 16 inches or larger, Male 17inches or larger? 1  Male Gender (Yes=1) 1  Obstructive Sleep Apnea Score 5

## 2019-07-27 NOTE — Anesthesia Preprocedure Evaluation (Addendum)
Anesthesia Evaluation  Patient identified by MRN, date of birth, ID band Patient awake    Reviewed: Allergy & Precautions, NPO status , Patient's Chart, lab work & pertinent test results, reviewed documented beta blocker date and time   Airway Mallampati: III  TM Distance: >3 FB Neck ROM: Full    Dental  (+) Missing, Dental Advisory Given,    Pulmonary neg pulmonary ROS,    Pulmonary exam normal breath sounds clear to auscultation       Cardiovascular hypertension, Pt. on home beta blockers and Pt. on medications negative cardio ROS Normal cardiovascular exam Rhythm:Regular Rate:Normal  HLD  EKG 04/21/19: Sinus Rhythm, ventricular rate 77 bpm. RBBB. ST-T changes secondary to RBBB.    CV: Left heart catheterization selective renal arteriogram 04/11/2019(Ganji, Ulice Dash, MD):  The left ventricular systolic function is normal.  LV end diastolic pressure is normal.  Moderate diffuse coronary artery disease with calcification. Right coronary artery with arteriomegaly and ectasia and slow filling. Left main: Normal. LAD arteriomegaly, ectasia noted, slow filling throughout the LAD. Diffuse disease. Circumflex: Large vessel, arteriomegaly, diffuse ectasia noted. Slow filling.  Selective right and left renal arteriogram: Normal renal arteries. No evidence of renal artery stenosis.  Impression: Abnormal nuclear stress test related to severe diffuse disease and ectasia of the coronary vessels, markedly reduced blood flow especially in the right coronary artery but also in the circumflex and LAD. This is related to endothelial dysfunction and microvascular disease along with ectasia contributing to slow flow. Findings suggestive of hypertensive heart disease.  Recommendation: Patient needs aggressive risk factor modification. If he persist to have any symptoms of angina pectoris, consider low-dose Xarelto 2.5 mg twice daily to  improve flow in the coronary arteries empirically. Could also try dual antiplatelet therapy for a short time. -Recommend Aspirin 81mg  daily for moderate CAD.   Lexiscan Tetrofosmin Stress Test 03/27/2019: Nondiagnostic ECG stress. Hypertensive at rest. Normal BP response to Lexiscan infusion.  Myocardial perfusion is abnormal. There is a partially reversible moderate sized mild ischemia in the inferior and inferolateral region.  Stress LV EF: 61%.  Mild diaphragmatic attenuation noted.  Intermediate risk study. No previous exam available for comparison.   Echocardiogram 03/27/2019: 1. Normal LV systolic function with visual EF 50-55%. Left ventricle  cavity is normal in size. Normal global wall motion. Normal diastolic  filling pattern, normal LAP. Mild left ventricular hypertrophy. Calculated  EF 50%.  2. Aortic sclerosis without stenosis.  3. No significant valvular abnormalities.  4. No prior study for comparison.    Neuro/Psych negative neurological ROS  negative psych ROS   GI/Hepatic negative GI ROS, Neg liver ROS,   Endo/Other  negative endocrine ROSdiabetes, Oral Hypoglycemic Agents  Renal/GU negative Renal ROS  negative genitourinary   Musculoskeletal  (+) Arthritis ,   Abdominal   Peds  Hematology negative hematology ROS (+)   Anesthesia Other Findings   Reproductive/Obstetrics                           Anesthesia Physical Anesthesia Plan  ASA: III  Anesthesia Plan: Spinal and Regional   Post-op Pain Management:  Regional for Post-op pain   Induction:   PONV Risk Score and Plan: 1 and Treatment may vary due to age or medical condition, Propofol infusion and Midazolam  Airway Management Planned: Natural Airway  Additional Equipment:   Intra-op Plan:   Post-operative Plan:   Informed Consent: I have reviewed the patients History and Physical,  chart, labs and discussed the procedure including the risks, benefits  and alternatives for the proposed anesthesia with the patient or authorized representative who has indicated his/her understanding and acceptance.     Dental advisory given  Plan Discussed with: CRNA  Anesthesia Plan Comments: (See APP note by Durel Salts, FNP)       Anesthesia Quick Evaluation

## 2019-07-27 NOTE — Progress Notes (Signed)
PCP - Dr. Orland Mustard Cardiologist - Dr. Terri Skains   Chest x-ray - N/A EKG - 04/21/19 Stress Test - 03/27/19 ECHO - 03/27/19  Cardiac Cath - 04/11/19   Sleep Study - denies; + stop bang, results sent to PCP   Fasting Blood Sugar- 120's Checks Blood Sugar - 1-2 times daily   Aspirin Instructions: Patient instructed to hold all Aspirin, NSAID's, herbal medications, fish oil and vitamins 7 days prior to surgery.   ERAS Protcol - no orders at PAT PRE-SURGERY Ensure or G2- no orders at PAT  COVID TEST- 07/28/19 at [facility]. Pt instructed to remain in their car. Educated on Transport planner until [employer].    Anesthesia review: heart history  Patient denies shortness of breath, fever, cough and chest pain at PAT appointment   All instructions explained to the patient, with a verbal understanding of the material. Patient agrees to go over the instructions while at home for a better understanding. Patient also instructed to self quarantine after being tested for COVID-19. The opportunity to ask questions was provided.

## 2019-07-28 ENCOUNTER — Encounter: Payer: Self-pay | Admitting: Cardiology

## 2019-07-28 ENCOUNTER — Other Ambulatory Visit (HOSPITAL_COMMUNITY)
Admission: RE | Admit: 2019-07-28 | Discharge: 2019-07-28 | Disposition: A | Discharge status: Home / Self Care | Payer: 59 | Source: Ambulatory Visit | Attending: Orthopedic Surgery | Admitting: Orthopedic Surgery

## 2019-07-28 ENCOUNTER — Ambulatory Visit: Payer: 59 | Admitting: Cardiology

## 2019-07-28 VITALS — BP 120/72 | HR 89 | Resp 17 | Ht 68.0 in | Wt 229.2 lb

## 2019-07-28 DIAGNOSIS — Z01812 Encounter for preprocedural laboratory examination: Secondary | ICD-10-CM | POA: Diagnosis present

## 2019-07-28 DIAGNOSIS — Z01818 Encounter for other preprocedural examination: Secondary | ICD-10-CM

## 2019-07-28 DIAGNOSIS — E782 Mixed hyperlipidemia: Secondary | ICD-10-CM

## 2019-07-28 DIAGNOSIS — Z20822 Contact with and (suspected) exposure to covid-19: Secondary | ICD-10-CM | POA: Insufficient documentation

## 2019-07-28 DIAGNOSIS — I4510 Unspecified right bundle-branch block: Secondary | ICD-10-CM

## 2019-07-28 DIAGNOSIS — Z6834 Body mass index (BMI) 34.0-34.9, adult: Secondary | ICD-10-CM

## 2019-07-28 DIAGNOSIS — I10 Essential (primary) hypertension: Secondary | ICD-10-CM

## 2019-07-28 LAB — SARS CORONAVIRUS 2 (TAT 6-24 HRS): SARS Coronavirus 2: NEGATIVE

## 2019-07-28 NOTE — Progress Notes (Signed)
Joe Stewart Date of Birth: [birth year] MRN: [id number] Primary Care Provider:Morrow, Delfino Lovett, NP Primary Cardiologist: Rex Kras, DO, [facility] (established care 03/24/2019)   Date: 07/28/19 Last Office Visit: 04/21/2019  Chief Complaint  Patient presents with  . Hypertension  . Follow-up    3 month    HPI  Joe Stewart is a 64 y.o. male who presents to the office with a chief complaint of " blood pressure follow-up." Patient's past medical history and cardiac risk factors include: Hypertension, obesity, osteoarthritis.  Patient is here for 46-monthfollow-up for blood pressure management and to see how he has done postoperatively.  However, patient states that his left knee replacement surgery is upcoming on August 01, 2019.  In regards to hypertension: Patient states that his blood pressure has improved significantly.  Initially when I saw him in the office several months ago his blood pressures used to be around 180-200 mmHg.  We have uptitrated his antihypertensive medications periodically and his home blood pressures now in the morning runs between 1169-678mmHg and diastolic blood pressures around 80 mmHg.  Due to decreased functional status patient has also undergone ischemic evaluation in the past and findings noted below for further reference.  Most recent blood work from June 2021 reviewed with the patient at today's office visit.  He denies any chest pain or anginal equivalent.  Currently is euvolemic.  Since last office visit patient has lost approximately 16 pounds with change in dietary patterns of decreasing salt and sugar intake.  He is congratulated on the efforts that he has made.  ALLERGIES: No Known Allergies  MEDICATION LIST PRIOR TO VISIT: Current Outpatient Medications on File Prior to Visit  Medication Sig Dispense Refill  . amLODipine (NORVASC) 10 MG tablet Take 1 tablet (10 mg total) by mouth in the morning. (Patient taking differently: Take 10 mg by mouth  daily. ) 90 tablet 1  . atorvastatin (LIPITOR) 40 MG tablet Take 1 tablet (40 mg total) by mouth daily. (Patient taking differently: Take 40 mg by mouth at bedtime. ) 90 tablet 3  . hydrochlorothiazide (HYDRODIURIL) 25 MG tablet Take 25 mg by mouth daily.     . metFORMIN (GLUCOPHAGE) 1000 MG tablet Take 1 tablet (1,000 mg total) by mouth 2 (two) times daily with a meal. 180 tablet 3  . metoprolol succinate (TOPROL-XL) 25 MG 24 hr tablet Take 1 tablet (25 mg total) by mouth daily. Take with or immediately following a meal. Hold if systolic blood pressure (top blood pressure number) less than 100 mmHg or heart rate less than 60 bpm (pulse). (Patient taking differently: Take 25 mg by mouth at bedtime. Take with or immediately following a meal. Hold if systolic blood pressure (top blood pressure number) less than 100 mmHg or heart rate less than 60 bpm (pulse).) 90 tablet 1  . Sodium Sulfate-Mag Sulfate-KCl (SUTAB) [phone] TABS Take 1 kit by mouth as directed. 24 tablet 0  . losartan (COZAAR) 25 MG tablet Take 1 tablet (25 mg total) by mouth every evening. (Patient taking differently: Take 25 mg by mouth at bedtime. ) 90 tablet 3   No current facility-administered medications on file prior to visit.    PAST MEDICAL HISTORY: Past Medical History:  Diagnosis Date  . Arthritis   . [place] mellitus [place]   . Hyperlipidemia   . Hypertension   . Obesity     PAST SURGICAL HISTORY: Past Surgical History:  Procedure Laterality Date  . EYE SURGERY Left  had to have eye flushed after getting costic sodium in L eye  . HERNIA REPAIR    . HERNIA REPAIR     35 years ago  . LEFT HEART CATH AND CORONARY ANGIOGRAPHY N/A 04/11/2019   Procedure: LEFT HEART CATH AND CORONARY ANGIOGRAPHY;  Surgeon: Adrian Prows, MD;  Location: [place] [facility];  Service: Cardiovascular;  Laterality: N/A;  . RENAL ANGIOGRAPHY Bilateral 04/11/2019   Procedure: RENAL ANGIOGRAPHY;  Surgeon: Adrian Prows,  MD;  Location: [facility] [place] [facility];  Service: Cardiovascular;  Laterality: Bilateral;    FAMILY HISTORY: The patient family history includes COPD in his mother and sister; Cancer in his mother; Diabetes in his mother; Healthy in his brother and son; Heart disease in his brother and father.   SOCIAL HISTORY:  The patient  reports that he has never smoked. He has never used smokeless tobacco. He reports previous alcohol use. He reports that he does not use drugs.  Review of Systems  Constitutional: Negative for chills and fever.  HENT: Negative for hoarse voice and nosebleeds.   Eyes: Negative for discharge, double vision and pain.  Cardiovascular: Negative for chest pain, claudication, dyspnea on exertion, leg swelling, near-syncope, orthopnea, palpitations, paroxysmal nocturnal dyspnea and syncope.  Respiratory: Negative for hemoptysis and shortness of breath.   Musculoskeletal: Positive for arthritis and joint pain. Negative for muscle cramps and myalgias.  Gastrointestinal: Negative for abdominal pain, constipation, diarrhea, hematemesis, hematochezia, melena, nausea and vomiting.  Neurological: Negative for dizziness and light-headedness.   PHYSICAL EXAM: Vitals with BMI 07/28/2019 07/27/2019 07/07/2019  Height '5\' 8"'  '5\' 8"'  '5\' 8"'   Weight 229 lbs 3 oz 230 lbs 6 oz 232 lbs 3 oz  BMI 34.86 58.52 77.82  Systolic 423 536 144  Diastolic 72 80 71  Pulse 89 91 100   CONSTITUTIONAL: Age-appropriate gentleman, walks with the walker, well-nourished, hemodynamically stable, no acute distress.  SKIN: Skin is warm and dry. No rash noted. No cyanosis. No pallor. No jaundice HEAD: Normocephalic and atraumatic.  EYES: No scleral icterus MOUTH/THROAT: Moist oral membranes.  NECK: No JVD present. No thyromegaly noted. No carotid bruits  LYMPHATIC: No visible cervical adenopathy.  CHEST Normal respiratory effort. No intercostal retractions  LUNGS: Clear to auscultations bilaterally.  No stridor. No  wheezes. No rales.  CARDIOVASCULAR: Regular rate and rhythm, positive S1-S2, no murmurs rubs or gallops appreciated. ABDOMINAL: Obese, soft, nontender, nondistended, positive bowel sounds all 4 quadrants.  No apparent ascites.  EXTREMITIES: No peripheral edema  HEMATOLOGIC: No significant bruising NEUROLOGIC: Oriented to person, place, and time. Nonfocal. Normal muscle tone.  PSYCHIATRIC: Normal mood and affect. Normal behavior. Cooperative  CARDIAC DATABASE: EKG: 03/24/2019: Normal sinus rhythm, ventricular rate 93 bpm, right bundle branch block, left atrial enlargement, ST-T changes most likely secondary to underlying right bundle branch block.  No apparent underlying ischemia or injury pattern.  Echocardiogram: 03/27/2019:  Normal LV systolic function with visual EF 50-55%. Left ventricle cavity is normal in size. Normal global wall motion. Normal diastolic filling pattern, normal LAP. Mild left ventricular hypertrophy.Aortic sclerosis without stenosis.   Stress Testing:  Lexiscan Tetrofosmin Stress Test  03/27/2019: Myocardial perfusion is abnormal. There is a partially reversible moderate sized mild ischemia in the inferior and inferolateral region. Stress LV EF: 61%. Mild diaphragmatic attenuation noted. Intermediate risk study.   Left heart catheterization selective renal arteriogram 04/11/2019: Moderate diffuse coronary artery disease with calcification.  Right coronary artery with arteriomegaly and ectasia and slow filling. Left main: Normal. LAD arteriomegaly, ectasia  noted, slow filling throughout the LAD.  Diffuse disease. Circumflex: Large vessel, arteriomegaly, diffuse ectasia noted.  Slow filling. Selective right and left renal arteriogram: Normal renal arteries.  No evidence of renal artery stenosis.  LABORATORY DATA: CBC Latest Ref Rng & Units 07/27/2019 04/26/2019 03/22/2019  WBC 4.0 - 10.5 K/uL 9.4 7.3 7.1  Hemoglobin 13.0 - 17.0 g/dL 12.6(L) 13.8 13.8  Hematocrit 39 - 52  % 39.1 41.8 43.1  Platelets 150 - 400 K/uL 294 189 254    CMP Latest Ref Rng & Units 07/27/2019 05/18/2019 04/26/2019  Glucose 70 - 99 mg/dL 119(H) 134(H) 345(H)  BUN 8 - 23 mg/dL '22 20 22  ' Creatinine 0.61 - 1.24 mg/dL 1.23 0.85 1.09  Sodium 135 - 145 mmol/L 140 142 137  Potassium 3.5 - 5.1 mmol/L 4.5 4.7 4.5  Chloride 98 - 111 mmol/L 104 104 99  CO2 22 - 32 mmol/L '22 22 26  ' Calcium 8.9 - 10.3 mg/dL 9.7 9.8 9.5    Lipid Panel     Component Value Date/Time   CHOL 146 04/07/2019 0929   TRIG 163 (H) 04/07/2019 0929   HDL 37 (L) 04/07/2019 0929   LDLCALC 81 04/07/2019 0929   LABVLDL 28 04/07/2019 0929   FINAL MEDICATION LIST END OF ENCOUNTER: No orders of the defined types were placed in this encounter.   Medications Discontinued During This Encounter  Medication Reason  . celecoxib (CELEBREX) 200 MG capsule Discontinued by provider     Current Outpatient Medications:  .  amLODipine (NORVASC) 10 MG tablet, Take 1 tablet (10 mg total) by mouth in the morning. (Patient taking differently: Take 10 mg by mouth daily. ), Disp: 90 tablet, Rfl: 1 .  atorvastatin (LIPITOR) 40 MG tablet, Take 1 tablet (40 mg total) by mouth daily. (Patient taking differently: Take 40 mg by mouth at bedtime. ), Disp: 90 tablet, Rfl: 3 .  hydrochlorothiazide (HYDRODIURIL) 25 MG tablet, Take 25 mg by mouth daily. , Disp: , Rfl:  .  metFORMIN (GLUCOPHAGE) 1000 MG tablet, Take 1 tablet (1,000 mg total) by mouth 2 (two) times daily with a meal., Disp: 180 tablet, Rfl: 3 .  metoprolol succinate (TOPROL-XL) 25 MG 24 hr tablet, Take 1 tablet (25 mg total) by mouth daily. Take with or immediately following a meal. Hold if systolic blood pressure (top blood pressure number) less than 100 mmHg or heart rate less than 60 bpm (pulse). (Patient taking differently: Take 25 mg by mouth at bedtime. Take with or immediately following a meal. Hold if systolic blood pressure (top blood pressure number) less than 100 mmHg or heart  rate less than 60 bpm (pulse).), Disp: 90 tablet, Rfl: 1 .  Sodium Sulfate-Mag Sulfate-KCl (SUTAB) [phone] MG TABS, Take 1 kit by mouth as directed., Disp: 24 tablet, Rfl: 0 .  losartan (COZAAR) 25 MG tablet, Take 1 tablet (25 mg total) by mouth every evening. (Patient taking differently: Take 25 mg by mouth at bedtime. ), Disp: 90 tablet, Rfl: 3  IMPRESSION:    ICD-10-CM   1. Essential hypertension  I10   2. Mixed hyperlipidemia  E78.2   3. RBBB  I45.10   4. Class 1 obesity due to excess calories without serious comorbidity with body mass index (BMI) of 34.0 to 34.9 in adult  E66.09    Z68.34   5. Pre-operative clearance  Z01.818      RECOMMENDATIONS: Preoperative risk stratification: Remains the same, please refer to the last office note.  Scheduled for left  knee replacement on August 01, 2019.  Benign essential hypertension: Improving. Office blood pressure is at goal. Medication reconciled.  Low salt diet recommended. A diet that is rich in fruits, vegetables, legumes, and low-fat dairy products and low in snacks, sweets, and meats (such as the Dietary Approaches to Stop Hypertension [DASH] diet).   Hyperlipidemia:   Continue Lipitor 40 mg p.o. nightly. Follow lipids. Currently managed by primary care provider. Patient denies myalgia or other side effects.  Right bundle branch block: See above.  Class I obesity, due to excess calories. Body mass index is 34.85 kg/m.  Since last office visit patient has lost approximately 16 pounds for which he is congratulated for. I reviewed with the patient the importance of diet, regular physical activity/exercise, weight loss.    Patient is educated on increasing physical activity gradually as tolerated.  With the goal of moderate intensity exercise for 30 minutes a day 5 days a week.  No orders of the defined types were placed in this encounter.  --Continue cardiac medications as reconciled in final medication list. --Return in  about 6 months (around 01/27/2020) for BP follow up... Or sooner if needed. --Continue follow-up with your primary care physician regarding the management of your other chronic comorbid conditions.  Patient's questions and concerns were addressed to his satisfaction. He voices understanding of the instructions provided during this encounter.   Total encounter time 30 minutes.  This note was created using a voice recognition software as a result there may be grammatical errors inadvertently enclosed that do not reflect the nature of this encounter. Every attempt is made to correct such errors.  Rex Kras, DO, [facility]. [place] Office: [phone]

## 2019-08-01 ENCOUNTER — Ambulatory Visit (HOSPITAL_COMMUNITY): Payer: 59

## 2019-08-01 ENCOUNTER — Encounter (HOSPITAL_COMMUNITY): Payer: Self-pay | Admitting: Orthopedic Surgery

## 2019-08-01 ENCOUNTER — Encounter (HOSPITAL_COMMUNITY)
Admission: RE | Disposition: A | Discharge status: Home / Self Care | Payer: Self-pay | Source: Home / Self Care | Attending: Orthopedic Surgery

## 2019-08-01 ENCOUNTER — Ambulatory Visit (HOSPITAL_COMMUNITY): Payer: 59 | Admitting: Emergency Medicine

## 2019-08-01 ENCOUNTER — Observation Stay (HOSPITAL_COMMUNITY)
Admission: RE | Admit: 2019-08-01 | Discharge: 2019-08-02 | Disposition: A | Discharge status: Home / Self Care | Payer: 59 | Attending: Orthopedic Surgery | Admitting: Orthopedic Surgery

## 2019-08-01 ENCOUNTER — Ambulatory Visit (HOSPITAL_COMMUNITY): Payer: 59 | Admitting: Certified Registered Nurse Anesthetist

## 2019-08-01 ENCOUNTER — Other Ambulatory Visit: Payer: Self-pay

## 2019-08-01 DIAGNOSIS — E669 Obesity, unspecified: Secondary | ICD-10-CM | POA: Diagnosis not present

## 2019-08-01 DIAGNOSIS — M1712 Unilateral primary osteoarthritis, left knee: Principal | ICD-10-CM

## 2019-08-01 DIAGNOSIS — I10 Essential (primary) hypertension: Secondary | ICD-10-CM | POA: Insufficient documentation

## 2019-08-01 DIAGNOSIS — I2510 Atherosclerotic heart disease of native coronary artery without angina pectoris: Secondary | ICD-10-CM | POA: Insufficient documentation

## 2019-08-01 DIAGNOSIS — Z6834 Body mass index (BMI) 34.0-34.9, adult: Secondary | ICD-10-CM | POA: Insufficient documentation

## 2019-08-01 DIAGNOSIS — Z7984 Long term (current) use of oral hypoglycemic drugs: Secondary | ICD-10-CM | POA: Insufficient documentation

## 2019-08-01 DIAGNOSIS — Z9889 Other specified postprocedural states: Secondary | ICD-10-CM

## 2019-08-01 DIAGNOSIS — E119 Type 2 diabetes mellitus without complications: Secondary | ICD-10-CM | POA: Insufficient documentation

## 2019-08-01 HISTORY — PX: TOTAL KNEE ARTHROPLASTY: SHX125

## 2019-08-01 LAB — GLUCOSE, CAPILLARY
Glucose-Capillary: 179 mg/dL — ABNORMAL HIGH (ref 70–99)
Glucose-Capillary: 182 mg/dL — ABNORMAL HIGH (ref 70–99)
Glucose-Capillary: 220 mg/dL — ABNORMAL HIGH (ref 70–99)

## 2019-08-01 SURGERY — ARTHROPLASTY, KNEE, TOTAL
Anesthesia: Regional | Site: Knee | Laterality: Left

## 2019-08-01 MED ORDER — ORAL CARE MOUTH RINSE: 15.0000 mL | Freq: Once | OROMUCOSAL | Status: AC

## 2019-08-01 MED ORDER — INSULIN ASPART 100 UNIT/ML ~~LOC~~ SOLN
4.0000 [IU] | Freq: Three times a day (TID) | SUBCUTANEOUS | Status: DC
  Administered 2019-08-01 – 2019-08-02 (×3): 4 [IU] via SUBCUTANEOUS

## 2019-08-01 MED ORDER — ACETAMINOPHEN 500 MG PO TABS
1000.0000 mg | ORAL_TABLET | Freq: Once | ORAL | Status: AC
  Administered 2019-08-01: 1000 mg via ORAL
  Filled 2019-08-01: qty 2

## 2019-08-01 MED ORDER — TRANEXAMIC ACID-NACL 1000-0.7 MG/100ML-% IV SOLN
INTRAVENOUS | Status: AC
  Filled 2019-08-01: qty 100

## 2019-08-01 MED ORDER — AMLODIPINE BESYLATE 10 MG PO TABS
10.0000 mg | ORAL_TABLET | Freq: Every day | ORAL | Status: DC
  Administered 2019-08-02: 10 mg via ORAL
  Filled 2019-08-01: qty 1

## 2019-08-01 MED ORDER — MORPHINE SULFATE (PF) 4 MG/ML IV SOLN
INTRAVENOUS | Status: AC
  Filled 2019-08-01: qty 1

## 2019-08-01 MED ORDER — HYDROCHLOROTHIAZIDE 25 MG PO TABS
25.0000 mg | ORAL_TABLET | Freq: Every day | ORAL | Status: DC
  Administered 2019-08-01 – 2019-08-02 (×2): 25 mg via ORAL
  Filled 2019-08-01 (×2): qty 1

## 2019-08-01 MED ORDER — FENTANYL CITRATE (PF) 250 MCG/5ML IJ SOLN
INTRAMUSCULAR | Status: DC | PRN
  Administered 2019-08-01 (×2): 25 ug via INTRAVENOUS
  Administered 2019-08-01 (×3): 50 ug via INTRAVENOUS
  Administered 2019-08-01 (×2): 25 ug via INTRAVENOUS

## 2019-08-01 MED ORDER — METOPROLOL SUCCINATE ER 25 MG PO TB24
25.0000 mg | ORAL_TABLET | Freq: Every day | ORAL | Status: DC
  Administered 2019-08-01 – 2019-08-02 (×2): 25 mg via ORAL
  Filled 2019-08-01 (×2): qty 1

## 2019-08-01 MED ORDER — BUPIVACAINE HCL (PF) 0.25 % IJ SOLN
INTRAMUSCULAR | Status: AC
  Filled 2019-08-01: qty 30

## 2019-08-01 MED ORDER — PHENYLEPHRINE 40 MCG/ML (10ML) SYRINGE FOR IV PUSH (FOR BLOOD PRESSURE SUPPORT)
PREFILLED_SYRINGE | INTRAVENOUS | Status: DC | PRN
  Administered 2019-08-01 (×6): 40 ug via INTRAVENOUS

## 2019-08-01 MED ORDER — METFORMIN HCL 500 MG PO TABS
1000.0000 mg | ORAL_TABLET | Freq: Two times a day (BID) | ORAL | Status: DC
  Administered 2019-08-01 – 2019-08-02 (×2): 1000 mg via ORAL
  Filled 2019-08-01 (×2): qty 2

## 2019-08-01 MED ORDER — IRRISEPT - 450ML BOTTLE WITH 0.05% CHG IN STERILE WATER, USP 99.95% OPTIME
TOPICAL | Status: DC | PRN
  Administered 2019-08-01: 450 mL via TOPICAL

## 2019-08-01 MED ORDER — LACTATED RINGERS IV SOLN: INTRAVENOUS | Status: DC

## 2019-08-01 MED ORDER — CLONIDINE HCL (ANALGESIA) 100 MCG/ML EP SOLN
EPIDURAL | Status: DC | PRN
  Administered 2019-08-01: 150 ug via INTRA_ARTICULAR

## 2019-08-01 MED ORDER — ONDANSETRON HCL 4 MG/2ML IJ SOLN
INTRAMUSCULAR | Status: DC | PRN
  Administered 2019-08-01: 4 mg via INTRAVENOUS

## 2019-08-01 MED ORDER — VANCOMYCIN HCL 1000 MG IV SOLR
INTRAVENOUS | Status: AC
  Filled 2019-08-01: qty 1000

## 2019-08-01 MED ORDER — TRANEXAMIC ACID 1000 MG/10ML IV SOLN
INTRAVENOUS | Status: DC | PRN
  Administered 2019-08-01: 2000 mg via TOPICAL

## 2019-08-01 MED ORDER — MORPHINE SULFATE (PF) 4 MG/ML IV SOLN
INTRAVENOUS | Status: DC | PRN
  Administered 2019-08-01: 8 mg via INTRAMUSCULAR

## 2019-08-01 MED ORDER — METHOCARBAMOL 1000 MG/10ML IJ SOLN
500.0000 mg | Freq: Four times a day (QID) | INTRAVENOUS | Status: DC | PRN
  Filled 2019-08-01: qty 5

## 2019-08-01 MED ORDER — CEFAZOLIN SODIUM-DEXTROSE 2-4 GM/100ML-% IV SOLN
INTRAVENOUS | Status: AC
  Filled 2019-08-01: qty 100

## 2019-08-01 MED ORDER — OXYCODONE HCL 5 MG PO TABS
5.0000 mg | ORAL_TABLET | ORAL | Status: DC | PRN
  Administered 2019-08-01 – 2019-08-02 (×3): 5 mg via ORAL
  Filled 2019-08-01 (×2): qty 1

## 2019-08-01 MED ORDER — BUPIVACAINE IN DEXTROSE 0.75-8.25 % IT SOLN
INTRATHECAL | Status: DC | PRN
Start: 2019-08-01 — End: 2019-08-01
  Administered 2019-08-01: 1.4 mL via INTRATHECAL

## 2019-08-01 MED ORDER — PHENYLEPHRINE HCL-NACL 10-0.9 MG/250ML-% IV SOLN
INTRAVENOUS | Status: DC | PRN
Start: 2019-08-01 — End: 2019-08-01
  Administered 2019-08-01: 80 ug/min via INTRAVENOUS
  Administered 2019-08-01: 25 ug/min via INTRAVENOUS

## 2019-08-01 MED ORDER — CEFAZOLIN SODIUM-DEXTROSE 2-4 GM/100ML-% IV SOLN
2.0000 g | INTRAVENOUS | Status: AC
  Administered 2019-08-01 (×2): 2 g via INTRAVENOUS

## 2019-08-01 MED ORDER — CHLORHEXIDINE GLUCONATE 0.12 % MT SOLN
15.0000 mL | Freq: Once | OROMUCOSAL | Status: AC
  Administered 2019-08-01: 15 mL via OROMUCOSAL
  Filled 2019-08-01: qty 15

## 2019-08-01 MED ORDER — METHOCARBAMOL 500 MG PO TABS
ORAL_TABLET | ORAL | Status: AC
  Filled 2019-08-01: qty 1

## 2019-08-01 MED ORDER — CLONIDINE HCL (ANALGESIA) 100 MCG/ML EP SOLN
EPIDURAL | Status: AC
  Filled 2019-08-01: qty 10

## 2019-08-01 MED ORDER — PROPOFOL 10 MG/ML IV BOLUS
INTRAVENOUS | Status: DC | PRN
  Administered 2019-08-01: 20 mg via INTRAVENOUS
  Administered 2019-08-01 (×2): 30 mg via INTRAVENOUS
  Administered 2019-08-01: 20 mg via INTRAVENOUS
  Administered 2019-08-01: 50 mg via INTRAVENOUS

## 2019-08-01 MED ORDER — 0.9 % SODIUM CHLORIDE (POUR BTL) OPTIME
TOPICAL | Status: DC | PRN
  Administered 2019-08-01: 1000 mL

## 2019-08-01 MED ORDER — PROPOFOL 10 MG/ML IV BOLUS
INTRAVENOUS | Status: AC
  Filled 2019-08-01: qty 20

## 2019-08-01 MED ORDER — MIDAZOLAM HCL 5 MG/5ML IJ SOLN
INTRAMUSCULAR | Status: DC | PRN
  Administered 2019-08-01: 2 mg via INTRAVENOUS

## 2019-08-01 MED ORDER — METHOCARBAMOL 500 MG PO TABS
500.0000 mg | ORAL_TABLET | Freq: Four times a day (QID) | ORAL | Status: DC | PRN
  Administered 2019-08-01 (×2): 500 mg via ORAL
  Filled 2019-08-01: qty 1

## 2019-08-01 MED ORDER — CEFAZOLIN SODIUM 1 G IJ SOLR
INTRAMUSCULAR | Status: AC
  Filled 2019-08-01: qty 20

## 2019-08-01 MED ORDER — INSULIN ASPART 100 UNIT/ML ~~LOC~~ SOLN
0.0000 [IU] | Freq: Three times a day (TID) | SUBCUTANEOUS | Status: DC
  Administered 2019-08-01: 5 [IU] via SUBCUTANEOUS
  Administered 2019-08-02: 3 [IU] via SUBCUTANEOUS

## 2019-08-01 MED ORDER — ATORVASTATIN CALCIUM 40 MG PO TABS
40.0000 mg | ORAL_TABLET | Freq: Every day | ORAL | Status: DC
  Administered 2019-08-01: 40 mg via ORAL
  Filled 2019-08-01: qty 1

## 2019-08-01 MED ORDER — CEFAZOLIN SODIUM-DEXTROSE 2-4 GM/100ML-% IV SOLN: 2.0000 g | Freq: Once | INTRAVENOUS | Status: DC

## 2019-08-01 MED ORDER — TRANEXAMIC ACID-NACL 1000-0.7 MG/100ML-% IV SOLN
1000.0000 mg | INTRAVENOUS | Status: AC
  Administered 2019-08-01: 1000 mg via INTRAVENOUS

## 2019-08-01 MED ORDER — FENTANYL CITRATE (PF) 250 MCG/5ML IJ SOLN
INTRAMUSCULAR | Status: AC
  Filled 2019-08-01: qty 5

## 2019-08-01 MED ORDER — FENTANYL CITRATE (PF) 100 MCG/2ML IJ SOLN
INTRAMUSCULAR | Status: AC
  Filled 2019-08-01: qty 2

## 2019-08-01 MED ORDER — BUPIVACAINE LIPOSOME 1.3 % IJ SUSP
INTRAMUSCULAR | Status: DC | PRN
  Administered 2019-08-01: 20 mL

## 2019-08-01 MED ORDER — PROPOFOL 500 MG/50ML IV EMUL
INTRAVENOUS | Status: DC | PRN
  Administered 2019-08-01: 75 ug/kg/min via INTRAVENOUS

## 2019-08-01 MED ORDER — ROPIVACAINE HCL 5 MG/ML IJ SOLN
INTRAMUSCULAR | Status: DC | PRN
  Administered 2019-08-01: 20 mL via PERINEURAL

## 2019-08-01 MED ORDER — SODIUM CHLORIDE 0.9% FLUSH
INTRAVENOUS | Status: DC | PRN
  Administered 2019-08-01: 20 mL via INTRAVENOUS

## 2019-08-01 MED ORDER — FENTANYL CITRATE (PF) 100 MCG/2ML IJ SOLN
25.0000 ug | INTRAMUSCULAR | Status: DC | PRN
  Administered 2019-08-01: 25 ug via INTRAVENOUS

## 2019-08-01 MED ORDER — TRANEXAMIC ACID 1000 MG/10ML IV SOLN
2000.0000 mg | Freq: Once | INTRAVENOUS | Status: DC
  Filled 2019-08-01: qty 20

## 2019-08-01 MED ORDER — MIDAZOLAM HCL 2 MG/2ML IJ SOLN
INTRAMUSCULAR | Status: AC
  Filled 2019-08-01: qty 2

## 2019-08-01 MED ORDER — BUPIVACAINE LIPOSOME 1.3 % IJ SUSP
20.0000 mL | Freq: Once | INTRAMUSCULAR | Status: DC
  Filled 2019-08-01: qty 20

## 2019-08-01 MED ORDER — BUPIVACAINE HCL 0.25 % IJ SOLN
INTRAMUSCULAR | Status: DC | PRN
  Administered 2019-08-01: 20 mL

## 2019-08-01 MED ORDER — DEXAMETHASONE SODIUM PHOSPHATE 10 MG/ML IJ SOLN
INTRAMUSCULAR | Status: DC | PRN
  Administered 2019-08-01: 5 mg

## 2019-08-01 MED ORDER — LOSARTAN POTASSIUM 25 MG PO TABS
25.0000 mg | ORAL_TABLET | Freq: Every day | ORAL | Status: DC
  Administered 2019-08-01: 25 mg via ORAL
  Filled 2019-08-01 (×2): qty 1

## 2019-08-01 MED ORDER — POVIDONE-IODINE 10 % EX SWAB: 2.0000 "application " | Freq: Once | CUTANEOUS | Status: DC

## 2019-08-01 MED ORDER — OXYCODONE HCL 5 MG PO TABS
ORAL_TABLET | ORAL | Status: AC
  Filled 2019-08-01: qty 1

## 2019-08-01 MED ORDER — SODIUM CHLORIDE 0.9 % IR SOLN
Status: DC | PRN
  Administered 2019-08-01: 3000 mL

## 2019-08-01 MED ORDER — VANCOMYCIN HCL 1000 MG IV SOLR
INTRAVENOUS | Status: DC | PRN
  Administered 2019-08-01: 1000 mg

## 2019-08-01 SURGICAL SUPPLY — 86 items
BAG DECANTER FOR FLEXI CONT (MISCELLANEOUS) ×3 IMPLANT
BANDAGE ESMARK 6X9 LF (GAUZE/BANDAGES/DRESSINGS) ×1 IMPLANT
BASEPLATE TIBIAL TRIATH (Orthopedic Implant) ×2 IMPLANT
BIT DRILL CANN 2.7 (BIT) ×4
BIT DRILL CANN 2.7MM (BIT) ×2
BIT DRILL SRG 2.7XCANN AO CPLG (BIT) IMPLANT
BIT DRL SRG 2.7XCANN AO CPLNG (BIT) ×2
BLADE AVERAGE 25MMX9MM (BLADE) ×1
BLADE AVERAGE 25X9 (BLADE) ×1 IMPLANT
BLADE LONG MED 31MMX9MM (MISCELLANEOUS) ×1
BLADE LONG MED 31X9 (MISCELLANEOUS) ×1 IMPLANT
BLADE SAG 18X100X1.27 (BLADE) ×3 IMPLANT
BNDG CMPR 9X6 STRL LF SNTH (GAUZE/BANDAGES/DRESSINGS) ×1
BNDG CMPR MED 10X6 ELC LF (GAUZE/BANDAGES/DRESSINGS) ×1
BNDG COHESIVE 6X5 TAN STRL LF (GAUZE/BANDAGES/DRESSINGS) ×3 IMPLANT
BNDG ELASTIC 6X10 VLCR STRL LF (GAUZE/BANDAGES/DRESSINGS) ×2 IMPLANT
BNDG ESMARK 6X9 LF (GAUZE/BANDAGES/DRESSINGS) ×3
BSPLAT TIB 4 UNV CMNT TL STAB (Orthopedic Implant) ×1 IMPLANT
BUR SURG 4X8 MED (BURR) IMPLANT
BURR SURG 4MMX8MM MEDIUM (BURR) ×1
BURR SURG 4X8 MED (BURR) ×2
CEMENT BONE SIMPLEX SPEEDSET (Cement) ×4 IMPLANT
CLOSURE WOUND 1/2 X4 (GAUZE/BANDAGES/DRESSINGS) ×3
CNTNR URN SCR LID CUP LEK RST (MISCELLANEOUS) ×1 IMPLANT
CONT SPEC 4OZ STRL OR WHT (MISCELLANEOUS) ×6
COVER SURGICAL LIGHT HANDLE (MISCELLANEOUS) ×3 IMPLANT
CUFF TOURN SGL QUICK 34 (TOURNIQUET CUFF) ×3
CUFF TRNQT CYL 34X4.125X (TOURNIQUET CUFF) ×1 IMPLANT
DECANTER SPIKE VIAL GLASS SM (MISCELLANEOUS) ×3 IMPLANT
DRAPE ORTHO SPLIT 77X108 STRL (DRAPES) ×9
DRAPE SURG ORHT 6 SPLT 77X108 (DRAPES) ×3 IMPLANT
DRAPE U-SHAPE 47X51 STRL (DRAPES) ×3 IMPLANT
DRSG AQUACEL AG ADV 3.5X14 (GAUZE/BANDAGES/DRESSINGS) ×2 IMPLANT
DURAPREP 26ML APPLICATOR (WOUND CARE) ×6 IMPLANT
ELECT CAUTERY BLADE 6.4 (BLADE) ×3 IMPLANT
ELECT REM PT RETURN 9FT ADLT (ELECTROSURGICAL) ×3
ELECTRODE REM PT RTRN 9FT ADLT (ELECTROSURGICAL) ×1 IMPLANT
GLOVE BIOGEL PI IND STRL 7.0 (GLOVE) ×1 IMPLANT
GLOVE BIOGEL PI IND STRL 8 (GLOVE) ×1 IMPLANT
GLOVE BIOGEL PI INDICATOR 7.0 (GLOVE) ×2
GLOVE BIOGEL PI INDICATOR 8 (GLOVE) ×2
GLOVE ECLIPSE 7.0 STRL STRAW (GLOVE) ×3 IMPLANT
GLOVE ECLIPSE 8.0 STRL XLNG CF (GLOVE) ×3 IMPLANT
GOWN STRL REUS W/ TWL LRG LVL3 (GOWN DISPOSABLE) ×3 IMPLANT
GOWN STRL REUS W/TWL LRG LVL3 (GOWN DISPOSABLE) ×9
GRAFT BNE HEAD FEM 43 (Bone Implant) IMPLANT
GRAFT HEAD FEMORAL 43MM TISSUE (Bone Implant) ×3 IMPLANT
HANDPIECE INTERPULSE COAX TIP (DISPOSABLE) ×3
HOOD PEEL AWAY FLYTE STAYCOOL (MISCELLANEOUS) ×9 IMPLANT
IMMOBILIZER KNEE 20 (SOFTGOODS)
IMMOBILIZER KNEE 20 THIGH 36 (SOFTGOODS) IMPLANT
IMMOBILIZER KNEE 22 UNIV (SOFTGOODS) ×2 IMPLANT
IMMOBILIZER KNEE 24 THIGH 36 (MISCELLANEOUS) IMPLANT
IMMOBILIZER KNEE 24 UNIV (MISCELLANEOUS)
INSERT TRIATH CS SZ4 10 (Insert) ×2 IMPLANT
K-WIRE ORTHOPEDIC 1.4X150L (WIRE) ×9
KIT BASIN OR (CUSTOM PROCEDURE TRAY) ×3 IMPLANT
KIT TURNOVER KIT B (KITS) ×3 IMPLANT
KNEE FEM COMP 5 L (Joint) ×2 IMPLANT
KWIRE ORTHOPEDIC 1.4X150L (WIRE) IMPLANT
MANIFOLD NEPTUNE II (INSTRUMENTS) ×3 IMPLANT
NDL SPNL 18GX3.5 QUINCKE PK (NEEDLE) ×1 IMPLANT
NEEDLE 22X1 1/2 (OR ONLY) (NEEDLE) ×6 IMPLANT
NEEDLE SPNL 18GX3.5 QUINCKE PK (NEEDLE) ×3 IMPLANT
NS IRRIG 1000ML POUR BTL (IV SOLUTION) ×6 IMPLANT
PACK TOTAL JOINT (CUSTOM PROCEDURE TRAY) ×3 IMPLANT
PAD ARMBOARD 7.5X6 YLW CONV (MISCELLANEOUS) ×6 IMPLANT
PADDING CAST COTTON 6X4 STRL (CAST SUPPLIES) ×4 IMPLANT
PATELLA A 35MMX10MM (Knees) ×2 IMPLANT
SCREW 40X4.0MM (Screw) ×2 IMPLANT
SCREW 42X4.0MM (Screw) ×2 IMPLANT
SCREW TI CANN 4.0X30 NSTRL (Screw) ×2 IMPLANT
SET HNDPC FAN SPRY TIP SCT (DISPOSABLE) ×1 IMPLANT
STEM CEMENTED TRIATHLON (Stem) ×2 IMPLANT
STRIP CLOSURE SKIN 1/2X4 (GAUZE/BANDAGES/DRESSINGS) ×3 IMPLANT
SUT MNCRL AB 3-0 PS2 18 (SUTURE) ×3 IMPLANT
SUT VIC AB 0 CT1 27 (SUTURE) ×9
SUT VIC AB 0 CT1 27XBRD ANBCTR (SUTURE) ×3 IMPLANT
SUT VIC AB 1 CT1 27 (SUTURE) ×15
SUT VIC AB 1 CT1 27XBRD ANBCTR (SUTURE) ×5 IMPLANT
SUT VIC AB 2-0 CT1 27 (SUTURE) ×12
SUT VIC AB 2-0 CT1 TAPERPNT 27 (SUTURE) ×4 IMPLANT
SYR 30ML LL (SYRINGE) ×9 IMPLANT
TOWEL GREEN STERILE (TOWEL DISPOSABLE) ×6 IMPLANT
TOWEL GREEN STERILE FF (TOWEL DISPOSABLE) ×6 IMPLANT
WASHER ASNIS III 4.0 SCR (Washer) ×4 IMPLANT

## 2019-08-01 NOTE — Anesthesia Procedure Notes (Signed)
Anesthesia Regional Block: Adductor canal block   Pre-Anesthetic Checklist: ,, timeout performed, Correct Patient, Correct Site, Correct Laterality, Correct Procedure, Correct Position, site marked, Risks and benefits discussed,  Surgical consent,  Pre-op evaluation,  At surgeon's request and post-op pain management  Laterality: Left  Prep: Maximum Sterile Barrier Precautions used, chloraprep       Needles:  Injection technique: Single-shot  Needle Type: Echogenic Stimulator Needle     Needle Length: 9cm  Needle Gauge: 22     Additional Needles:   Procedures:,,,, ultrasound used (permanent image in chart),,,,  Narrative:  Start time: 08/01/2019 7:09 AM End time: 08/01/2019 7:19 AM Injection made incrementally with aspirations every 5 mL.  Performed by: Personally  Anesthesiologist: Freddrick March, MD  Additional Notes: Monitors applied. No increased pain on injection. No increased resistance to injection. Injection made in 5cc increments. Good needle visualization. Patient tolerated procedure well.

## 2019-08-01 NOTE — Transfer of Care (Signed)
Immediate Anesthesia Transfer of Care Note  Patient: Joe Stewart  Procedure(s) Performed: LEFT TOTAL KNEE ARTHROPLASTY (Left Knee)  Patient Location: PACU  Anesthesia Type:General and Regional  Level of Consciousness: drowsy and patient cooperative  Airway & Oxygen Therapy: Patient Spontanous Breathing and Patient connected to face mask oxygen  Post-op Assessment: Report given to RN and Post -op Vital signs reviewed and stable  Post vital signs: Reviewed and stable  Last Vitals:  Vitals Value Taken Time  BP 95/56 08/01/19 1223  Temp    Pulse 72 08/01/19 1224  Resp 18 08/01/19 1224  SpO2 99 % 08/01/19 1224  Vitals shown include unvalidated device data.  Last Pain:  Vitals:   08/01/19 0634  PainSc: 0-No pain         Complications: No complications documented.

## 2019-08-01 NOTE — Anesthesia Postprocedure Evaluation (Signed)
Anesthesia Post Note  Patient: Slaton Reaser  Procedure(s) Performed: LEFT TOTAL KNEE ARTHROPLASTY (Left Knee)     Patient location during evaluation: PACU Anesthesia Type: Regional, General and Spinal (Case started under spinal anesthesia. Due to lenth of procedure, converted to general as spinal wore off ) Level of consciousness: oriented and awake and alert Pain management: pain level controlled Vital Signs Assessment: post-procedure vital signs reviewed and stable Respiratory status: spontaneous breathing, respiratory function stable and patient connected to nasal cannula oxygen Cardiovascular status: blood pressure returned to baseline and stable Postop Assessment: no headache, no backache and no apparent nausea or vomiting Anesthetic complications: no   No complications documented.  Last Vitals:  Vitals:   08/01/19 1339 08/01/19 1356  BP: 114/66 121/67  Pulse: 67 67  Resp: 19 17  Temp: 36.7 C 36.6 C  SpO2: 98% 97%    Last Pain:  Vitals:   08/01/19 1400  TempSrc:   PainSc: 0-No pain                 Lina Hitch L Loreley Schwall

## 2019-08-01 NOTE — Anesthesia Procedure Notes (Signed)
Procedure Name: LMA Insertion Date/Time: 08/01/2019 11:07 AM Performed by: Colin Benton, CRNA Pre-anesthesia Checklist: Patient identified, Emergency Drugs available, Suction available and Patient being monitored Patient Re-evaluated:Patient Re-evaluated prior to induction Oxygen Delivery Method: Circle system utilized Preoxygenation: Pre-oxygenation with 100% oxygen Induction Type: IV induction Ventilation: Mask ventilation without difficulty LMA: LMA inserted LMA Size: 4.0 Number of attempts: 1 Placement Confirmation: positive ETCO2 and breath sounds checked- equal and bilateral Tube secured with: Tape Dental Injury: Teeth and Oropharynx as per pre-operative assessment

## 2019-08-01 NOTE — Progress Notes (Signed)
Orthopedic Tech Progress Note Patient Details:  Joe Stewart [birth year] [id number] RN called for CPM and bone foam. Applied CPM to left knee and added ice. Left bone foam at bedside.  CPM Left Knee CPM Left Knee: On Left Knee Flexion (Degrees): 10 Left Knee Extension (Degrees): 40 Additional Comments: Added ice  Post Interventions Patient Tolerated: Well Instructions Provided: Care of device Ortho Devices Type of Ortho Device: Bone foam zero knee Ortho Device/Splint Interventions: Ordered   Post Interventions Patient Tolerated: Well Instructions Provided: Care of device   Janit Pagan 08/01/2019, 1:34 PM

## 2019-08-01 NOTE — Brief Op Note (Signed)
   08/01/2019  12:32 PM  PATIENT:  Joe Stewart  64 y.o. male  PRE-OPERATIVE DIAGNOSIS:  left knee osteoarthritis  POST-OPERATIVE DIAGNOSIS:  left knee osteoarthritis with cavitary uncontained defect on the medial tibial plateau and cavitary contained defect on the medial femoral condyle  PROCEDURE:  Procedure(s): LEFT TOTAL KNEE ARTHROPLASTY with structural femoral head allograft for medial tibial plateau defect and medial femoral condyle cavitary defect  SURGEON:  Surgeon(s): Meredith Pel, MD  ASSISTANT: magnant pa  ANESTHESIA:   spinal  EBL: 200 ml    Total I/O In: 2160 [I.V.:2050; IV Piggyback:110] Out: 800 [Urine:600; Blood:200]  BLOOD ADMINISTERED: none  DRAINS: none   LOCAL MEDICATIONS USED:  none  SPECIMEN: Bone from the cavitary defect of the medial tibia and medial femoral condyle sent for pathology  Synovium from the joint also sent to pathology  COUNTS:  YES  TOURNIQUET:   Total Tourniquet Time Documented: Thigh (Left) - 92 minutes Thigh (Left) - 36 minutes Total: Thigh (Left) - 128 minutes   DICTATION: .Other Dictation: Dictation Number [id number]  PLAN OF CARE: Admit for overnight observation  PATIENT DISPOSITION:  PACU - hemodynamically stable

## 2019-08-01 NOTE — Plan of Care (Signed)

## 2019-08-01 NOTE — Op Note (Signed)
NAMEAUBERY, DOUTHAT MEDICAL RECORD [id number] ACCOUNT [id number] DATE OF BIRTH:07/14/1955 FACILITY: [facility] LOCATION: [facility] PHYSICIAN:Trinda Harlacher Randel Pigg, MD  OPERATIVE REPORT  DATE OF PROCEDURE:  08/01/2019  PREOPERATIVE DIAGNOSIS:  Left knee arthritis.  POSTOPERATIVE DIAGNOSIS:  Left knee arthritis with cavitary uncontained defect, medial tibial plateau and cavitary contained defect, medial femoral condyle.  SURGEON:  Meredith Pel, MD  ASSISTANT:  Annie Main, PA  INDICATIONS:  Joe Stewart is a 64 year old patient whom we saw about 6 months ago with early collapse of the medial compartment consistent with likely osteonecrosis or bone infarcts on the medial femoral condyle and to a lesser extent on the medial tibial  plateau.  He presents now for operative management of knee arthritis.  He has been doing some ambulation on that knee.  Denies any other orthopedic issues.  COMPONENTS UTILIZED:  Include [employer]ker cemented Triathlon asymmetric patella 35 x 10 with Triathlon universal tibial baseplate, size 4, cruciate retaining femoral component, size 5 Triathlon and cemented stem on the tibia 12 x 15 mm.  PROCEDURE IN DETAIL:  The patient was brought to the operating room where spinal anesthetic was induced.  Preoperative antibiotics administered.  Timeout was called.  Left leg was then pre-scrubbed with alcohol and Betadine, allowed to air dry.  Prep  with DuraPrep solution and draped in a sterile manner.  The patient did have significantly more varus laxity compared to his previous clinic visit.  The leg was then pre-scrubbed with alcohol and Betadine prepped and draped.  Ioban used to cover the  operative field.  Timeout was called.  Leg was elevated, exsanguinated with the Esmarch wrap.  Tourniquet was inflated to 300 mmHg.  First, tourniquet inflation time period was 92 minutes.  Anterior approach to knee was made.  Skin and subcutaneous  tissue were sharply divided.  Median parapatellar  approach was made and the patella was everted.  There was significant synovitis within the suprapatellar pouch.  This synovitis was removed and sent for analysis.  The patient had also significant  uncontained cavitary defect involving about 1/2 of the medial tibial plateau.  Bone from this area along with cavitary defect within the medial femoral condyle was also sent.  The bone encompassing the defect was highly sclerotic.  No evidence of  infection or purulence.  Bone samples were sent and synovial samples were sent.  Medial soft tissue sleeve was elevated from around the medial tibial plateau.  There was some detached callus formation within the soft tissue, which was also removed.  At  this time, intramedullary alignment was then used after resecting the ACL and the anterior horn of the meniscus.  The cut was made initially 9 mm off the least affected lateral tibial plateau.  This was later revised down 2 more millimeters.  Cavitary  defect remained.  Decision was made at that time to fill that uncontained defect with allograft bone as a support for that medial aspect of the medial tibial plateau.  Attention then directed towards the femur.  Intramedullary alignment was then used to  cut the femur distally at 5 degrees of valgus.  Anterior, posterior and chamfer cuts were made.  The patient had a significant cavitary defect within the medial femoral condyle, but this was contained.  The collateral ligament attachment site on the  medial side was intact.  Both of these sites were scraped and prepared with a curette to a bony bleeding surface.  The femur was sized to a size 5.  At this time,  femoral head allograft was then utilized to fill in that medial tibial plateau defect.  Two  screws were placed, which were 4.0 cannulated cancellous screws.  These were placed after shaping the femoral head allograft to fit the defect.  Good fixation was achieved and the screws were placed to avoid later thin  placement from the implant.  The  same thing was done for that cavitary defect on the medial femoral condyle.  The defect was then filled with femoral head allograft, single piece, which was shaped to fill the defect.  The tibia was then keel punched and a femoral trial was placed.   Secure fixation of that tibial plateau structural allograft was achieved.  With a size 4 tibia with a stem and a size 5 femur patient had very good extension and flexion with good patellar tracking with a 9 and 11 mm implant.  The patient had about 3-4  degrees of hyperextension with a 9 mm implant.  Trial implants were removed.  A thorough irrigation was then performed and the tourniquet was released.  A thorough irrigation with pouring and pulsatile irrigation was performed.  The drill holes were  placed in the sclerotic bone on both the femur and the tibia.  Bone graft was prepared for that cavitary defect as well as where autologous as well as the allograft piece would fill.  Capsule was anesthetized using a solution of Marcaine, saline and  Exparel.  Tranexamic acid sponge was placed in there for 3 minutes.  IrriSept solution was used after the incision as well as after the arthrotomy and at all times during the case to diminish chance for infection.  Following this after about 25 minutes the tourniquet was reinflated.  Thorough irrigation again performed.  The components were cemented into position with good alignment and balance achieved.  The patient had good stability to varus and valgus stress at  0, 30 and 90 degrees with no liftoff with both a 9 and 11 mm spacer.  A 10 mm spacer was placed.  The patient did require a small lateral release in order to optimize patellar tracking.  Tourniquet was again released and bleeding points encountered  controlled using electrocautery.  Thorough pouring irrigation was again utilized.  Vancomycin powder placed within the incision.  Arthrotomy was closed over a bolster using #1  Vicryl suture followed by interrupted inverted 0 Vicryl suture, 2-0 Vicryl  suture and a 3-0 Monocryl.  Steri-Strips and Aquacel dressing applied.  A solution of Marcaine, morphine, clonidine injected into the knee for postop pain relief.  The patient was placed in a bulky dressing and knee immobilizer.  He tolerated the  procedure well without immediate complications.  He was transferred to the recovery room in stable condition.  Luke's assistance was required at all times during the case for retraction, opening and closing, tissue manipulation.  His assistance was a  medical necessity.  CN/NUANCE  D:08/01/2019 T:08/01/2019 JOB:[id number]

## 2019-08-01 NOTE — Anesthesia Procedure Notes (Signed)
Spinal  Patient location during procedure: OR Start time: 08/01/2019 7:30 AM End time: 08/01/2019 7:39 AM Staffing Performed: anesthesiologist  Anesthesiologist: Freddrick March, MD Preanesthetic Checklist Completed: patient identified, IV checked, risks and benefits discussed, surgical consent, monitors and equipment checked, pre-op evaluation and timeout performed Spinal Block Patient position: sitting Prep: DuraPrep and site prepped and draped Patient monitoring: cardiac monitor, continuous pulse ox and blood pressure Approach: midline Location: L3-4 Injection technique: single-shot Needle Needle type: Pencan  Needle gauge: 24 G Needle length: 9 cm Assessment Sensory level: T6 Additional Notes Functioning IV was confirmed and monitors were applied. Sterile prep and drape, including hand hygiene and sterile gloves were used. The patient was positioned and the spine was prepped. The skin was anesthetized with lidocaine.  Free flow of clear CSF was obtained prior to injecting local anesthetic into the CSF.  The spinal needle aspirated freely following injection.  The needle was carefully withdrawn.  The patient tolerated the procedure well.

## 2019-08-01 NOTE — Anesthesia Procedure Notes (Signed)
Procedure Name: MAC Date/Time: 08/01/2019 7:43 AM Performed by: Colin Benton, CRNA Pre-anesthesia Checklist: Patient identified, Emergency Drugs available, Suction available and Patient being monitored Patient Re-evaluated:Patient Re-evaluated prior to induction Oxygen Delivery Method: Simple face mask Induction Type: IV induction Placement Confirmation: positive ETCO2 Dental Injury: Teeth and Oropharynx as per pre-operative assessment

## 2019-08-01 NOTE — H&P (Signed)
TOTAL KNEE ADMISSION H&P  Patient is being admitted for left total knee arthroplasty.  Subjective:  Chief Complaint:left knee pain.  HPI: Joe Stewart, 64 y.o. male, has a history of pain and functional disability in the left knee due to arthritis and has failed non-surgical conservative treatments for greater than 12 weeks to includecorticosteriod injections and activity modification.  Onset of symptoms was abrupt, starting 1 years ago with rapidlly worsening course since that time. The patient noted no past surgery on the left knee(s).  Patient currently rates pain in the left knee(s) at 8 out of 10 with activity. Patient has night pain, worsening of pain with activity and weight bearing, pain that interferes with activities of daily living, pain with passive range of motion, crepitus and joint swelling.  Patient has evidence of subchondral cysts, subchondral sclerosis and joint space narrowing by imaging studies. This patient has had Rapid progression of knee pain following subchondral collapse on that medial side.  No fevers or chills or evidence clinically or radiographically of infection.. There is no active infection.  Patient Active Problem List   Diagnosis Date Noted  . Type 2 diabetes mellitus without complication, without long-term current use of insulin ([place]) 05/01/2019  . Dyspnea on exertion 04/10/2019  . Abnormal nuclear stress test 04/10/2019  . Resistant hypertension 02/27/2019  . Acute pain of left knee 02/27/2019   Past Medical History:  Diagnosis Date  . Arthritis   . Diabetes mellitus without complication ([place])   . Hyperlipidemia   . Hypertension   . Obesity     Past Surgical History:  Procedure Laterality Date  . EYE SURGERY Left    had to have eye flushed after getting costic sodium in L eye  . HERNIA REPAIR    . HERNIA REPAIR     35 years ago  . LEFT HEART CATH AND CORONARY ANGIOGRAPHY N/A 04/11/2019   Procedure: LEFT HEART CATH AND CORONARY ANGIOGRAPHY;   Surgeon: Adrian Prows, MD;  Location: [facility];  Service: Cardiovascular;  Laterality: N/A;  . RENAL ANGIOGRAPHY Bilateral 04/11/2019   Procedure: RENAL ANGIOGRAPHY;  Surgeon: Adrian Prows, MD;  Location: [facility];  Service: Cardiovascular;  Laterality: Bilateral;    Current Facility-Administered Medications  Medication Dose Route Frequency Provider Last Rate Last Admin  . ceFAZolin (ANCEF) 2-4 GM/100ML-% IVPB           . ceFAZolin (ANCEF) IVPB 2g/100 mL premix  2 g Intravenous Once Meredith Pel, MD      . lactated ringers infusion   Intravenous Continuous Marlou Sa Tonna Corner, MD   New Bag at 08/01/19 [phone]  . tranexamic acid (CYKLOKAPRON) 2,000 mg in sodium chloride 0.9 % 50 mL Topical Application  1,191 mg Topical Once Meredith Pel, MD       No Known Allergies  Social History   Tobacco Use  . Smoking status: Never Smoker  . Smokeless tobacco: Never Used  Substance Use Topics  . Alcohol use: Not Currently    Family History  Problem Relation Age of Onset  . Cancer Mother   . Diabetes Mother   . COPD Mother   . Heart disease Father   . COPD Sister   . Healthy Brother   . Heart disease Brother   . Healthy Son   . Colon cancer Neg Hx   . Colon polyps Neg Hx   . Esophageal cancer Neg Hx   . Rectal cancer Neg Hx   . Stomach cancer Neg  Hx      Review of Systems  Musculoskeletal: Positive for arthralgias.  All other systems reviewed and are negative.   Objective:  Physical Exam Vitals reviewed.  HENT:     Head: Normocephalic.     Nose: Nose normal.     Mouth/Throat:     Mouth: Mucous membranes are moist.  Eyes:     Pupils: Pupils are equal, round, and reactive to light.  Cardiovascular:     Rate and Rhythm: Normal rate.     Pulses: Normal pulses.  Pulmonary:     Effort: Pulmonary effort is normal.  Musculoskeletal:     Cervical back: Normal range of motion.  Skin:    General: Skin is warm.     Capillary Refill: Capillary refill takes  less than 2 seconds.  Neurological:     General: No focal deficit present.     Mental Status: He is alert.  Psychiatric:        Mood and Affect: Mood normal.   Examination of the left lower extremity demonstrates some venous stasis changes but these appear chronic.  No acute erythema or induration.  Pedal pulses palpable.  Patient does have some varus laxity to the left knee consistent with his known subchondral collapse on that medial side.  Extensor mechanism is intact.  Ankle dorsiflexion is intact.  No groin pain with internal extra rotation of the leg.  Varus alignment is present  Vital signs in last 24 hours: Temp:  [98.9 F (37.2 C)] 98.9 F (37.2 C) (06/29 0610) Pulse Rate:  [79] 79 (06/29 0610) Resp:  [17] 17 (06/29 0610) BP: (163)/(73) 163/73 (06/29 0610) SpO2:  [99 %] 99 % (06/29 0610) Weight:  [104 kg] 104 kg (06/29 0610)  Labs:   Estimated body mass index is 34.86 kg/m as calculated from the following:   Height as of this encounter: 5\' 8"  (1.727 m).   Weight as of this encounter: 104 kg.   Imaging Review Plain radiographs demonstrate severe degenerative joint disease of the left knee(s). The overall alignment issignificant varus. The bone quality appears to be good for age and reported activity level.      Assessment/Plan:  End stage arthritis, left knee   The patient history, physical examination, clinical judgment of the provider and imaging studies are consistent with end stage degenerative joint disease of the left knee(s) and total knee arthroplasty is deemed medically necessary. The treatment options including medical management, injection therapy arthroscopy and arthroplasty were discussed at length. The risks and benefits of total knee arthroplasty were presented and reviewed. The risks due to aseptic loosening, infection, stiffness, patella tracking problems, thromboembolic complications and other imponderables were discussed. The patient acknowledged the  explanation, agreed to proceed with the plan and consent was signed. Patient is being admitted for inpatient treatment for surgery, pain control, PT, OT, prophylactic antibiotics, VTE prophylaxis, progressive ambulation and ADL's and discharge planning. The patient is planning to be discharged home with home health services revision components may be required for this knee replacement depending on the amount of collapse on the tibial side.     Patient's anticipated LOS is less than 2 midnights, meeting these requirements: - Younger than 34 - Lives within 1 hour of care - Has a competent adult at home to recover with post-op recover - NO history of  - Chronic pain requiring opiods  - Diabetes  - Coronary Artery Disease  - Heart failure  - Heart attack  - Stroke  -  DVT/VTE  - Cardiac arrhythmia  - Respiratory Failure/COPD  - Renal failure  - Anemia  - Advanced Liver disease

## 2019-08-02 ENCOUNTER — Ambulatory Visit: Payer: 59 | Admitting: Gastroenterology

## 2019-08-02 DIAGNOSIS — M1712 Unilateral primary osteoarthritis, left knee: Secondary | ICD-10-CM | POA: Diagnosis not present

## 2019-08-02 LAB — GLUCOSE, CAPILLARY: Glucose-Capillary: 186 mg/dL — ABNORMAL HIGH (ref 70–99)

## 2019-08-02 MED ORDER — ASPIRIN 81 MG PO CHEW: 81.0000 mg | CHEWABLE_TABLET | Freq: Every day | ORAL | 0 refills | Status: DC

## 2019-08-02 MED ORDER — DOCUSATE SODIUM 100 MG PO CAPS
100.0000 mg | ORAL_CAPSULE | Freq: Two times a day (BID) | ORAL | Status: DC
  Administered 2019-08-02: 100 mg via ORAL
  Filled 2019-08-02: qty 1

## 2019-08-02 MED ORDER — PHENOL 1.4 % MT LIQD: 1.0000 | OROMUCOSAL | Status: DC | PRN

## 2019-08-02 MED ORDER — METHOCARBAMOL 500 MG PO TABS: 500.0000 mg | ORAL_TABLET | Freq: Three times a day (TID) | ORAL | 0 refills | Status: DC | PRN

## 2019-08-02 MED ORDER — OXYCODONE HCL 5 MG PO TABS: 5.0000 mg | ORAL_TABLET | ORAL | 0 refills | Status: DC | PRN

## 2019-08-02 MED ORDER — ASPIRIN 81 MG PO CHEW
81.0000 mg | CHEWABLE_TABLET | Freq: Two times a day (BID) | ORAL | Status: DC
  Administered 2019-08-02: 81 mg via ORAL
  Filled 2019-08-02: qty 1

## 2019-08-02 MED ORDER — MENTHOL 3 MG MT LOZG: 1.0000 | LOZENGE | OROMUCOSAL | Status: DC | PRN

## 2019-08-02 MED ORDER — CEFAZOLIN SODIUM-DEXTROSE 2-4 GM/100ML-% IV SOLN
2.0000 g | Freq: Three times a day (TID) | INTRAVENOUS | Status: DC
  Administered 2019-08-02: 2 g via INTRAVENOUS
  Filled 2019-08-02: qty 100

## 2019-08-02 MED ORDER — LACTATED RINGERS IV SOLN: INTRAVENOUS | Status: DC

## 2019-08-02 MED ORDER — HYDROMORPHONE HCL 1 MG/ML IJ SOLN: 0.5000 mg | INTRAMUSCULAR | Status: DC | PRN

## 2019-08-02 NOTE — Evaluation (Signed)
Physical Therapy Evaluation Patient Details Name: Joe Stewart MRN: [id number] DOB: 1955/10/06 Today's Date: 08/02/2019   History of Present Illness  Pt is a 64 y/o male s/p L TKA. PMH includes HTN and DM.   Clinical Impression  Pt is s/p surgery above with deficits below. Pt requiring min guard to supervision for gait and stair navigation this session. Overall tolerated mobility well. Educated and gave handout about HEP. Educated about knee precautions. Pt reports he feels comfortable to d/c home. Will continue to follow acutely to maximize functional mobility independence and safety.     Follow Up Recommendations Follow surgeon's recommendation for DC plan and follow-up therapies;Supervision for mobility/OOB    Equipment Recommendations  None recommended by PT    Recommendations for Other Services       Precautions / Restrictions Precautions Precautions: Knee Precaution Booklet Issued: Yes (comment) Precaution Comments: Reviewed knee precautions and verbally reviewed HEP handout.  Restrictions Weight Bearing Restrictions: Yes LLE Weight Bearing: Weight bearing as tolerated      Mobility  Bed Mobility Overal bed mobility: Modified Independent                Transfers Overall transfer level: Needs assistance Equipment used: Rolling walker (2 wheeled) Transfers: Sit to/from Stand Sit to Stand: Supervision         General transfer comment: Supervision for safety.  Ambulation/Gait Ambulation/Gait assistance: Min guard;Supervision Gait Distance (Feet): 120 Feet Assistive device: Rolling walker (2 wheeled) Gait Pattern/deviations: Step-to pattern;Decreased step length - right;Decreased step length - left;Decreased weight shift to left;Antalgic Gait velocity: Decreased   General Gait Details: Antalgic gait. Wider step width noted. Cues for sequencing using RW.   Stairs Stairs: Yes Stairs assistance: Min guard Stair Management: One rail Left;Sideways;Step to  pattern Number of Stairs: 4 General stair comments: Cues for LE sequencing. Practiced sideways technique using single rail. Min guard for safety.   Wheelchair Mobility    Modified Rankin (Stroke Patients Only)       Balance Overall balance assessment: Needs assistance Sitting-balance support: No upper extremity supported;Feet supported Sitting balance-Leahy Scale: Good     Standing balance support: Bilateral upper extremity supported;During functional activity Standing balance-Leahy Scale: Poor Standing balance comment: Reliant on BUE support                              Pertinent Vitals/Pain Pain Assessment: Faces Faces Pain Scale: Hurts little more Pain Location: L thigh Pain Descriptors / Indicators: Numbness;Grimacing Pain Intervention(s): Monitored during session;Limited activity within patient's tolerance;Repositioned    Home Living Family/patient expects to be discharged to:: Private residence Living Arrangements: Alone Available Help at Discharge: Friend(s);Family Type of Home: House Home Access: Stairs to enter Entrance Stairs-Rails: Left Entrance Stairs-Number of Steps: 2 Home Layout: One level Home Equipment: Clinical cytogeneticist - 2 wheels;Grab bars - toilet;Cane - single point      Prior Function Level of Independence: Independent with assistive device(s)         Comments: Was using cane      Hand Dominance        Extremity/Trunk Assessment   Upper Extremity Assessment Upper Extremity Assessment: Overall WFL for tasks assessed    Lower Extremity Assessment Lower Extremity Assessment: LLE deficits/detail LLE Deficits / Details: Deficits consistent with post op pain and weakness.     Cervical / Trunk Assessment Cervical / Trunk Assessment: Normal  Communication   Communication: No difficulties  Cognition Arousal/Alertness: Awake/alert Behavior During  Therapy: WFL for tasks assessed/performed Overall Cognitive Status: Within  Functional Limits for tasks assessed                                        General Comments General comments (skin integrity, edema, etc.): Verbally reviewed and had pt practice a couple reps of each exercise on handout.     Exercises     Assessment/Plan    PT Assessment Patient needs continued PT services  PT Problem List Decreased strength;Decreased range of motion;Decreased activity tolerance;Decreased balance;Decreased mobility;Decreased knowledge of use of DME;Decreased knowledge of precautions;Pain       PT Treatment Interventions Gait training;DME instruction;Functional mobility training;Therapeutic activities;Therapeutic exercise;Stair training;Balance training;Patient/family education    PT Goals (Current goals can be found in the Care Plan section)  Acute Rehab PT Goals Patient Stated Goal: to go home PT Goal Formulation: With patient Time For Goal Achievement: 08/16/19 Potential to Achieve Goals: Good    Frequency 7X/week   Barriers to discharge        Co-evaluation               AM-PAC PT "6 Clicks" Mobility  Outcome Measure Help needed turning from your back to your side while in a flat bed without using bedrails?: None Help needed moving from lying on your back to sitting on the side of a flat bed without using bedrails?: None Help needed moving to and from a bed to a chair (including a wheelchair)?: A Little Help needed standing up from a chair using your arms (e.g., wheelchair or bedside chair)?: A Little Help needed to walk in hospital room?: A Little Help needed climbing 3-5 steps with a railing? : A Little 6 Click Score: 20    End of Session Equipment Utilized During Treatment: Gait belt Activity Tolerance: Patient tolerated treatment well Patient left: in chair;with call bell/phone within reach Nurse Communication: Mobility status PT Visit Diagnosis: Other abnormalities of gait and mobility (R26.89);Pain Pain - Right/Left:  Left Pain - part of body: Knee    Time: 1310-1334 PT Time Calculation (min) (ACUTE ONLY): 24 min   Charges:   PT Evaluation $PT Eval Low Complexity: 1 Low PT Treatments $Gait Training: 8-22 mins        Lou Miner, DPT  Acute Rehabilitation Services  Pager: [phone] Office: [phone]   Joe Stewart 08/02/2019, 3:14 PM

## 2019-08-02 NOTE — Progress Notes (Signed)
Tolerated CPM 4hrs- then bone foam for 45 min

## 2019-08-02 NOTE — Progress Notes (Signed)
Pt seen by PT and performed gait and stair training well. Reports he is comfortable to go home and has necessary assist. Follow up recommendations per surgeon.   Formal note to follow.   Reuel Derby, PT, DPT  Acute Rehabilitation Services  Pager: [phone] Office: [phone]

## 2019-08-02 NOTE — Plan of Care (Signed)
?  Problem: Clinical Measurements: ?Goal: Will remain free from infection ?Outcome: Progressing ?  ?

## 2019-08-03 ENCOUNTER — Telehealth: Payer: Self-pay

## 2019-08-03 NOTE — Telephone Encounter (Signed)
Eric garland from [facility] wanting orders for home PT

## 2019-08-03 NOTE — Telephone Encounter (Signed)
IC verbal given.  

## 2019-08-04 ENCOUNTER — Encounter (HOSPITAL_COMMUNITY): Payer: Self-pay | Admitting: Orthopedic Surgery

## 2019-08-06 LAB — AEROBIC/ANAEROBIC CULTURE W GRAM STAIN (SURGICAL/DEEP WOUND)
Culture: NO GROWTH
Culture: NO GROWTH
Gram Stain: NONE SEEN
Gram Stain: NONE SEEN

## 2019-08-08 NOTE — Discharge Summary (Signed)
Physician Discharge Summary      Patient ID: Joe Stewart MRN: [id number] DOB/AGE: 1955/06/17 64 y.o.  Admit date: 08/01/2019 Discharge date: 08/02/2019  Admission Diagnoses:  Active Problems:   Arthritis of left knee   Discharge Diagnoses:  Same  Surgeries: Procedure(s): LEFT TOTAL KNEE ARTHROPLASTY on 08/01/2019   Consultants:   Discharged Condition: Stable  Hospital Course: Joe Stewart is an 64 y.o. male who was admitted 08/01/2019 with a chief complaint of left knee pain, and found to have a diagnosis of left knee OA.  They were brought to the operating room on 08/01/2019 and underwent the above named procedures.  Pt awoke from anesthesia without complication and was transferred to the floor. On POD1, pt's pain was well-controlled and he felt comfortable for discharge.  He excelled in PT and was able to ambulate well and ascend stairs.  He was discharged home on POD1.  Pt will f/u with Dr. Marlou Sa in clinic in ~2 weeks.   Antibiotics given:  Anti-infectives (From admission, onward)   Start     Dose/Rate Route Frequency Ordered Stop   08/02/19 1400  ceFAZolin (ANCEF) IVPB 2g/100 mL premix  Status:  Discontinued        2 g 200 mL/hr over 30 Minutes Intravenous Every 8 hours 08/02/19 1156 08/02/19 2230   08/01/19 1112  vancomycin (VANCOCIN) powder  Status:  Discontinued          As needed 08/01/19 1112 08/01/19 1218   08/01/19 0730  ceFAZolin (ANCEF) IVPB 2g/100 mL premix  Status:  Discontinued        2 g 200 mL/hr over 30 Minutes Intravenous  Once 08/01/19 0717 08/01/19 0725   08/01/19 0730  ceFAZolin (ANCEF) IVPB 2g/100 mL premix        2 g 200 mL/hr over 30 Minutes Intravenous On call to O.R. 08/01/19 0725 08/01/19 1154   08/01/19 0716  ceFAZolin (ANCEF) 2-4 GM/100ML-% IVPB       Note to Pharmacy: Henrine Screws   : cabinet override      08/01/19 0716 08/01/19 0744    .  Recent vital signs:  Vitals:   08/02/19 0800 08/02/19 1404  BP: 114/66 105/68  Pulse: 74 71  Resp:  18 17  Temp: 98 F (36.7 C) 98.4 F (36.9 C)  SpO2: 98% 99%    Recent laboratory studies:  Results for orders placed or performed during the hospital encounter of 08/01/19  Aerobic/Anaerobic Culture (surgical/deep wound)   Specimen: Soft Tissue, Other  Result Value Ref Range   Specimen Description TISSUE LEFT KNEE    Special Requests SPECIMEN A    Gram Stain NO WBC SEEN NO ORGANISMS SEEN     Culture      No growth aerobically or anaerobically. Performed at [facility], [place] [address] [place], [place] [address]    Report Status 08/06/2019 FINAL   Aerobic/Anaerobic Culture (surgical/deep wound)   Specimen: Bone; Tissue  Result Value Ref Range   Specimen Description BONE LEFT KNEE    Special Requests NONE    Gram Stain NO WBC SEEN NO ORGANISMS SEEN     Culture      No growth aerobically or anaerobically. Performed at [facility], [place] [address], [place], [place] [address]    Report Status 08/06/2019 FINAL   Glucose, capillary  Result Value Ref Range   Glucose-Capillary 179 (H) 70 - 99 mg/dL  Glucose, capillary  Result Value Ref Range   Glucose-Capillary 182 (H)  70 - 99 mg/dL  Glucose, capillary  Result Value Ref Range   Glucose-Capillary 220 (H) 70 - 99 mg/dL  Glucose, capillary  Result Value Ref Range   Glucose-Capillary 186 (H) 70 - 99 mg/dL    Discharge Medications:   Allergies as of 08/02/2019   No Known Allergies     Medication List    TAKE these medications   amLODipine 10 MG tablet Commonly known as: NORVASC Take 1 tablet (10 mg total) by mouth in the morning. What changed: when to take this   aspirin 81 MG chewable tablet Chew 1 tablet (81 mg total) by mouth daily.   atorvastatin 40 MG tablet Commonly known as: LIPITOR Take 1 tablet (40 mg total) by mouth daily. What changed: when to take this   hydrochlorothiazide 25 MG tablet Commonly known as: HYDRODIURIL Take 25 mg by mouth daily.   losartan 25 MG tablet Commonly known  as: COZAAR Take 1 tablet (25 mg total) by mouth every evening. What changed: when to take this   metFORMIN 1000 MG tablet Commonly known as: GLUCOPHAGE Take 1 tablet (1,000 mg total) by mouth 2 (two) times daily with a meal.   methocarbamol 500 MG tablet Commonly known as: ROBAXIN Take 1 tablet (500 mg total) by mouth every 8 (eight) hours as needed for muscle spasms.   metoprolol succinate 25 MG 24 hr tablet Commonly known as: TOPROL-XL Take 1 tablet (25 mg total) by mouth daily. Take with or immediately following a meal. Hold if systolic blood pressure (top blood pressure number) less than 100 mmHg or heart rate less than 60 bpm (pulse). What changed: when to take this   oxyCODONE 5 MG immediate release tablet Commonly known as: Oxy IR/ROXICODONE Take 1 tablet (5 mg total) by mouth every 4 (four) hours as needed for moderate pain (pain score 4-6).   Sutab [phone] MG Tabs Generic drug: Sodium Sulfate-Mag Sulfate-KCl Take 1 kit by mouth as directed.       Diagnostic Studies: DG Knee Left Port  Result Date: 08/01/2019 CLINICAL DATA:  Status post total knee arthroplasty EXAM: PORTABLE LEFT KNEE - 1-2 VIEW COMPARISON:  Radiographic examination left knee February 27, 2019 and left knee MRI March 04, 2019 FINDINGS: Frontal and lateral views were obtained. Patient is status post total knee arthroplasty with femoral and tibial prosthetic components well-seated. There are screws transfixing a prior fracture the medial aspect of the medial tibial plateau with alignment in this area is essentially anatomic. There is lucency in this area of postoperative change in the medial tibial plateau region. No acute fracture or dislocation. No erosive change. There is intra-articular air and fluid consistent with recent surgery. IMPRESSION: Prosthetic components well-seated. Spurs and place in the medial aspect of the medial tibial plateau with evidence of prior fracture in this area and anatomic  alignment. There is relative lucency in the medial aspect medial tibial plateau at the site of screw fixation. From an imaging standpoint, chronic osteomyelitis is difficult to exclude in this area. There is no acute fracture or dislocation. Postoperative changes noted. Electronically Signed   By: Lowella Grip III M.D.   On: 08/01/2019 12:47    Disposition: Discharge disposition: 01-Home or Self Care       Discharge Instructions    Call MD / Call 911   Complete by: As directed    If you experience chest pain or shortness of breath, CALL 911 and be transported to the hospital emergency room.  If you  develope a fever above 101 F, pus (white drainage) or increased drainage or redness at the wound, or calf pain, call your surgeon's office.   Constipation Prevention   Complete by: As directed    Drink plenty of fluids.  Prune juice may be helpful.  You may use a stool softener, such as Colace (over the counter) 100 mg twice a day.  Use MiraLax (over the counter) for constipation as needed.   Diet - low sodium heart healthy   Complete by: As directed    Discharge instructions   Complete by: As directed    You may shower, dressing is waterproof.  Do not remove the dressing, we will remove it at your first post-op appointment.  Do not take a bath or soak the knee in a tub or pool.  You may weightbear as you can tolerate on the operative leg with a walker.  Continue using the CPM machine 3 times per day for one hour each time, increasing the degrees of range of motion daily.  Use the blue cradle boot under your heel to work on getting your leg straight.  Do NOT put a pillow under your knee.  You will follow-up with Dr. Marlou Sa in the clinic in 1-2 weeks at your given appointment date.    Dental Antibiotics:  In most cases prophylactic antibiotics for Dental procdeures after total joint surgery are not necessary.  Exceptions are as follows:  1. History of prior total joint infection  2.  Severely immunocompromised (Organ Transplant, cancer chemotherapy, Rheumatoid biologic meds such as Hubbard)  3. Poorly controlled diabetes (A1C &gt; 8.0, blood glucose over 200)  If you have one of these conditions, contact your surgeon for an antibiotic prescription, prior to your dental procedure.   Increase activity slowly as tolerated   Complete by: As directed          Signed: Donella Stade 08/08/2019, 9:30 PM

## 2019-08-09 ENCOUNTER — Encounter (HOSPITAL_COMMUNITY): Payer: Self-pay | Admitting: Orthopedic Surgery

## 2019-08-16 ENCOUNTER — Ambulatory Visit (INDEPENDENT_AMBULATORY_CARE_PROVIDER_SITE_OTHER): Payer: 59

## 2019-08-16 ENCOUNTER — Ambulatory Visit (INDEPENDENT_AMBULATORY_CARE_PROVIDER_SITE_OTHER): Payer: 59 | Admitting: Orthopedic Surgery

## 2019-08-16 DIAGNOSIS — Z96652 Presence of left artificial knee joint: Secondary | ICD-10-CM | POA: Diagnosis not present

## 2019-08-18 ENCOUNTER — Ambulatory Visit (INDEPENDENT_AMBULATORY_CARE_PROVIDER_SITE_OTHER): Payer: 59 | Admitting: Physical Therapy

## 2019-08-18 ENCOUNTER — Encounter: Payer: Self-pay | Admitting: Physical Therapy

## 2019-08-18 ENCOUNTER — Other Ambulatory Visit: Payer: Self-pay

## 2019-08-18 DIAGNOSIS — R600 Localized edema: Secondary | ICD-10-CM

## 2019-08-18 DIAGNOSIS — M6281 Muscle weakness (generalized): Secondary | ICD-10-CM | POA: Diagnosis not present

## 2019-08-18 DIAGNOSIS — R2689 Other abnormalities of gait and mobility: Secondary | ICD-10-CM

## 2019-08-18 DIAGNOSIS — M25562 Pain in left knee: Secondary | ICD-10-CM

## 2019-08-18 DIAGNOSIS — M25662 Stiffness of left knee, not elsewhere classified: Secondary | ICD-10-CM

## 2019-08-18 NOTE — Therapy (Signed)
[facility] [address] [place], [place], [address] Phone: [phone]   Fax:  [phone]  Physical Therapy Evaluation  Patient Details  Name: Joe Stewart MRN: [id number] Date of Birth: Mar 19, 1955 Referring Provider (PT): Marlou Sa, MD   Encounter Date: 08/18/2019   PT End of Session - 08/18/19 1137    Visit Number 1    Number of Visits 12    Date for PT Re-Evaluation 09/22/19    PT Start Time 1100    PT Stop Time 1145    PT Time Calculation (min) 45 min    Activity Tolerance Patient tolerated treatment well    Behavior During Therapy [facility] for tasks assessed/performed           Past Medical History:  Diagnosis Date  . Arthritis   . Diabetes mellitus without complication ([place])   . Hyperlipidemia   . Hypertension   . Obesity     Past Surgical History:  Procedure Laterality Date  . EYE SURGERY Left    had to have eye flushed after getting costic sodium in L eye  . HERNIA REPAIR    . HERNIA REPAIR     35 years ago  . LEFT HEART CATH AND CORONARY ANGIOGRAPHY N/A 04/11/2019   Procedure: LEFT HEART CATH AND CORONARY ANGIOGRAPHY;  Surgeon: Adrian Prows, MD;  Location: [facility];  Service: Cardiovascular;  Laterality: N/A;  . RENAL ANGIOGRAPHY Bilateral 04/11/2019   Procedure: RENAL ANGIOGRAPHY;  Surgeon: Adrian Prows, MD;  Location: [place] [facility];  Service: Cardiovascular;  Laterality: Bilateral;  . TOTAL KNEE ARTHROPLASTY Left 08/01/2019   Procedure: LEFT TOTAL KNEE ARTHROPLASTY;  Surgeon: Meredith Pel, MD;  Location: [place];  Service: Orthopedics;  Laterality: Left;    There were no vitals filed for this visit.    Subjective Assessment - 08/18/19 1101    Subjective He had Lt TKA and relays he is doing well since, says before the operation his Lt leg was in bad shape. He had HHPT and now is not using AD. Only has pain if he bends his knee too much    Pertinent History S/P Lt TKA    How long can you sit comfortably? 10 min      How long can you walk comfortably? short grocery store distance probably but not tried it    Patient Stated Goals hopefully retire and get back to fishing    Currently in Pain? No/denies    Pain Score 1     Pain Location Knee    Pain Orientation Left    Pain Descriptors / Indicators Tightness    Pain Type Surgical pain              [facility] PT Assessment - 08/18/19 0001      Assessment   Medical Diagnosis S/P Lt TKA    Referring Provider (PT) Marlou Sa, MD    Onset Date/Surgical Date 08/01/19    Next MD Visit 09/06/19    Prior Therapy HHPT      Restrictions   Other Position/Activity Restrictions per MD "WBAT avoid excessive loading x 2 weeks (ie sit ups) Due to allograft with reconstruction of tibial plateau and femoral condyle"      Balance Screen   Has the patient fallen in the past 6 months No    Has the patient had a decrease in activity level because of a fear of falling?  No    Is the patient reluctant to leave their home because  of a fear of falling?  No      Home Ecologist residence      Prior Function   Level of Independence Independent    Vocation Requirements will hopefully retire after his knee rehab    Leisure fishing      Cognition   Overall Cognitive Status Within Functional Limits for tasks assessed      Functional Tests   Functional tests Single leg stance      ROM / Strength   AROM / PROM / Strength AROM;PROM;Strength      AROM   AROM Assessment Site Knee    Right/Left Knee Left    Left Knee Extension 2    Left Knee Flexion 100   AAROM with strap     PROM   PROM Assessment Site Knee    Right/Left Knee Left    Left Knee Extension 0    Left Knee Flexion 110      Strength   Overall Strength Comments testing in seated hip/knee strength overall 4+/5 on Lt leg      Transfers   Transfers Independent with all Transfers      Ambulation/Gait   Ambulation/Gait Yes    Ambulation/Gait Assistance 7: Independent     Ambulation Distance (Feet) 100 Feet    Assistive device None    Gait Pattern Decreased step length - left;Decreased stance time - left;Decreased hip/knee flexion - left                      Objective measurements completed on examination: See above findings.       [facility] Adult PT Treatment/Exercise - 08/18/19 0001      Exercises   Exercises Knee/Hip      Knee/Hip Exercises: Stretches   Active Hamstring Stretch Left;2 reps;30 seconds    Active Hamstring Stretch Limitations supine with strap    Knee: Self-Stretch Limitations supine heelslides AAROM with strap 10 sec X 10 reps      Knee/Hip Exercises: Standing   Knee Flexion Left;15 reps    Knee Flexion Limitations red      Knee/Hip Exercises: Seated   Long Arc Quad Left;15 reps    Long Arc Quad Limitations red band    Sit to [employer] --   avoid this exercise for 2 weeks per MD     Modalities   Modalities Vasopneumatic      Vasopneumatic   Number Minutes Vasopneumatic  10 minutes    Vasopnuematic Location  Knee    Vasopneumatic Pressure Medium    Vasopneumatic Temperature  34      Manual Therapy   Manual therapy comments Lt knee PROM to tolerance                  PT Education - 08/18/19 1135    Education Details HEP, POC, MD restrictions of no excessive loading for 2 more weeks but WBAT    Person(s) Educated Patient    Methods Explanation;Demonstration;Verbal cues;Handout    Comprehension Verbalized understanding;Returned demonstration               PT Long Term Goals - 08/18/19 1145      PT LONG TERM GOAL #1   Title Pt will be I and compliant with HEP. (Target for all goals 09/22/19)    Time 4    Period Weeks    Status New      PT LONG TERM GOAL #2   Title  Pt will improve Lt hip/knee strength to 5/5 MMT tested in sitting to improve overall funciton    Time 4    Period Weeks    Status New      PT LONG TERM GOAL #3   Title Pt will improve Lt knee AROM 0-120 deg to improve function.     Time 4    Period Weeks    Status New      PT LONG TERM GOAL #4   Title Pt will be able to ambulate community distances independently without AD,  1000 ft on even/uneven surfaces and negotiate at least one flight of stairs.    Time 4    Period Weeks    Status New                  Plan - 08/18/19 1137    Clinical Impression Statement He is S/P Lt TKA and per MD "WBAT avoid excessive loading x 2 weeks (ie sit ups) Due to allograft with reconstruction of tibial plateau and femoral condyle". Overall he is doing really well post op but still with mild deficits in Lt knee strength, ROM, prolonged standing/walking toleance, had moderate edema in his Left leg and pain all limiting his function. He will benefit from skilled PT to address his deficits.    Examination-Activity Limitations Bend;Carry;Squat;Stairs;Lift;Stand;Locomotion Level    Examination-Participation Restrictions Cleaning;Community Activity;Laundry;Shop    Stability/Clinical Decision Making Stable/Uncomplicated    Clinical Decision Making Low    Rehab Potential Good    PT Frequency 3x / week   2-3   PT Duration 4 weeks   4-5 weeks   PT Treatment/Interventions ADLs/Self Care Home Management;Cryotherapy;Electrical Stimulation;Iontophoresis 4mg /ml Dexamethasone;Moist Heat;Ultrasound;Gait training;Stair training;Therapeutic activities;Therapeutic exercise;Balance training;Neuromuscular re-education;Patient/family education;Manual techniques;Passive range of motion;Dry needling;Joint Manipulations;Vasopneumatic Device;Taping    PT Next Visit Plan Begin with bike or nu step, per MD "WBAT avoid excessive loading x 2 weeks (ie sit ups) Due to allograft with reconstruction of tibial plateau and femoral condyle", PT interprets "sit ups" as sit to stand exercises that would cause excessive loading.    PT Home Exercise Plan Access  Code: [email or web address], added tailgate knee flexion stretch with OP    Consulted and Agree with Plan of Care Patient             Patient will benefit from skilled therapeutic intervention in order to improve the following deficits and impairments:  Abnormal gait, Decreased activity tolerance, Decreased balance, Decreased strength, Decreased range of motion, Difficulty walking, Increased edema, Impaired flexibility, Pain  Visit Diagnosis: Acute pain of left knee  Stiffness of left knee, not elsewhere classified  Muscle weakness (generalized)  Other abnormalities of gait and mobility  Localized edema     Problem List Patient Active Problem List   Diagnosis Date Noted  . Arthritis of left knee 08/01/2019  . Type 2 diabetes mellitus without complication, without long-term current use of insulin ([place]) 05/01/2019  . Dyspnea on exertion 04/10/2019  . Abnormal nuclear stress test 04/10/2019  . Resistant hypertension 02/27/2019  . Acute pain of left knee 02/27/2019    Silvestre Mesi 08/18/2019, 11:55 AM  [facility] [address] [place], [place], [address] Phone: [phone]   Fax:  [phone]  Name: Kenniel Bergsma MRN: [id number] Date of Birth: May 17, 1955

## 2019-08-18 NOTE — Patient Instructions (Addendum)
Access  Code: [email or web address] URL: [email or web address] Date: 08/18/2019 Prepared by: [name]  Exercises Supine Hamstring Stretch with Strap - 2 x daily - 6 x weekly - 3 sets - 30 hold Supine Heel Slide with Strap - 2 x daily - 6 x weekly - 10 reps - 2-3 sets - 5 hold Supine Active Straight Leg Raise - 2 x daily - 6 x weekly - 2-3 sets - 10 reps Seated Knee Extension with Resistance - 2 x daily - 6 x weekly - 3 sets - 10-15 reps Standing Hamstring Curl with Resistance - 2 x daily - 6 x weekly - 3 sets - 10-15 reps Added tailgate stretching for flexion 10 sec X 10 reps

## 2019-08-19 ENCOUNTER — Encounter: Payer: Self-pay | Admitting: Orthopedic Surgery

## 2019-08-19 NOTE — Progress Notes (Signed)
Post-Op Visit Note   Patient: Joe Stewart           Date of Birth: 08/03/1955           MRN: [id number] Visit Date: 08/16/2019 PCP: [name], NP   Assessment & Plan:  Chief Complaint:  Chief Complaint  Patient presents with  . Left Knee - Routine Post Op   Visit Diagnoses:  1. Status post total left knee replacement     Plan: Enrigue is now 2 weeks out left total knee replacement.  This required allograft reconstruction of the medial tibial plateau and medial femoral condyle.  Overall he is doing well.  No pain meds.  Negative Homans no calf tenderness.  Incision intact.  X-rays look good.  Therapy here 3 times a week for 4 weeks.  Important that the therapist reviewed the clinic note which will state that Stancil needs to be careful with a lot of excessive loading.  I think range of motion and seated strengthening is fine but in terms of loading and squatting I want to give that medial tibial plateau allograft about 4 weeks to heal before he does loaded flexion.  He is fine to weight-bear as tolerated but for exercise purposes seated quad sets will be better than one leg and standing squats and the like.  Come back in 4 weeks for clinic recheck.  Follow-Up Instructions: No follow-ups on file.   Orders:  Orders Placed This Encounter  Procedures  . XR Knee 1-2 Views Left  . Ambulatory referral to Physical Therapy   No orders of the defined types were placed in this encounter.   Imaging: No results found.  PMFS History: Patient Active Problem List   Diagnosis Date Noted  . Arthritis of left knee 08/01/2019  . Type 2 diabetes mellitus without complication, without long-term current use of insulin ([place]) 05/01/2019  . Dyspnea on exertion 04/10/2019  . Abnormal nuclear stress test 04/10/2019  . Resistant hypertension 02/27/2019  . Acute pain of left knee 02/27/2019   Past Medical History:  Diagnosis Date  . Arthritis   . Diabetes mellitus without complication ([place])   .  Hyperlipidemia   . Hypertension   . Obesity     Family History  Problem Relation Age of Onset  . Cancer Mother   . Diabetes Mother   . COPD Mother   . Heart disease Father   . COPD Sister   . Healthy Brother   . Heart disease Brother   . Healthy Son   . Colon cancer Neg Hx   . Colon polyps Neg Hx   . Esophageal cancer Neg Hx   . Rectal cancer Neg Hx   . Stomach cancer Neg Hx     Past Surgical History:  Procedure Laterality Date  . EYE SURGERY Left    had to have eye flushed after getting costic sodium in L eye  . HERNIA REPAIR    . HERNIA REPAIR     35 years ago  . LEFT HEART CATH AND CORONARY ANGIOGRAPHY N/A 04/11/2019   Procedure: LEFT HEART CATH AND CORONARY ANGIOGRAPHY;  Surgeon: Adrian Prows, MD;  Location: [facility];  Service: Cardiovascular;  Laterality: N/A;  . RENAL ANGIOGRAPHY Bilateral 04/11/2019   Procedure: RENAL ANGIOGRAPHY;  Surgeon: Adrian Prows, MD;  Location: [facility];  Service: Cardiovascular;  Laterality: Bilateral;  . TOTAL KNEE ARTHROPLASTY Left 08/01/2019   Procedure: LEFT TOTAL KNEE ARTHROPLASTY;  Surgeon: Meredith Pel, MD;  Location: [place];  Service: Orthopedics;  Laterality: Left;   Social History   Occupational History  . Not on file  Tobacco Use  . Smoking status: Never Smoker  . Smokeless tobacco: Never Used  Vaping Use  . Vaping Use: Never used  Substance and Sexual Activity  . Alcohol use: Not Currently  . Drug use: Never  . Sexual activity: Not Currently

## 2019-08-23 ENCOUNTER — Ambulatory Visit (INDEPENDENT_AMBULATORY_CARE_PROVIDER_SITE_OTHER): Payer: 59 | Admitting: Physical Therapy

## 2019-08-23 ENCOUNTER — Other Ambulatory Visit: Payer: Self-pay

## 2019-08-23 ENCOUNTER — Encounter: Payer: Self-pay | Admitting: Physical Therapy

## 2019-08-23 DIAGNOSIS — R2689 Other abnormalities of gait and mobility: Secondary | ICD-10-CM | POA: Diagnosis not present

## 2019-08-23 DIAGNOSIS — M25662 Stiffness of left knee, not elsewhere classified: Secondary | ICD-10-CM

## 2019-08-23 DIAGNOSIS — M6281 Muscle weakness (generalized): Secondary | ICD-10-CM

## 2019-08-23 DIAGNOSIS — R600 Localized edema: Secondary | ICD-10-CM

## 2019-08-23 DIAGNOSIS — M25562 Pain in left knee: Secondary | ICD-10-CM

## 2019-08-23 NOTE — Therapy (Signed)
[facility] [address] [place], [place], [address] Phone: [phone]   Fax:  [phone]  Physical Therapy Treatment  Patient Details  Name: Joe Stewart MRN: [id number] Date of Birth: [birth year] Referring Provider (PT): Marlou Sa, MD   Encounter Date: 08/23/2019   PT End of Session - 08/23/19 1001    Visit Number 2    Number of Visits 12    Date for PT Re-Evaluation 09/22/19    PT Start Time 0814    PT Stop Time 0953    PT Time Calculation (min) 58 min    Activity Tolerance Patient tolerated treatment well    Behavior During Therapy [facility] for tasks assessed/performed           Past Medical History:  Diagnosis Date  . Arthritis   . Diabetes mellitus without complication ([place])   . Hyperlipidemia   . Hypertension   . Obesity     Past Surgical History:  Procedure Laterality Date  . EYE SURGERY Left    had to have eye flushed after getting costic sodium in L eye  . HERNIA REPAIR    . HERNIA REPAIR     35 years ago  . LEFT HEART CATH AND CORONARY ANGIOGRAPHY N/A 04/11/2019   Procedure: LEFT HEART CATH AND CORONARY ANGIOGRAPHY;  Surgeon: Adrian Prows, MD;  Location: [place] [facility];  Service: Cardiovascular;  Laterality: N/A;  . RENAL ANGIOGRAPHY Bilateral 04/11/2019   Procedure: RENAL ANGIOGRAPHY;  Surgeon: Adrian Prows, MD;  Location: [place] [facility];  Service: Cardiovascular;  Laterality: Bilateral;  . TOTAL KNEE ARTHROPLASTY Left 08/01/2019   Procedure: LEFT TOTAL KNEE ARTHROPLASTY;  Surgeon: Meredith Pel, MD;  Location: [place];  Service: Orthopedics;  Laterality: Left;    There were no vitals filed for this visit.   Subjective Assessment - 08/23/19 0858    Subjective He has been doing his exercises without issues. No falls.    Pertinent History S/P Lt TKA    How long can you sit comfortably? 10 min    How long can you walk comfortably? short grocery store distance probably but not tried it    Patient Stated Goals hopefully  retire and get back to fishing    Currently in Pain? No/denies                             [facility] Adult PT Treatment/Exercise - 08/23/19 0855      Transfers   Transfers Independent with all Transfers      Ambulation/Gait   Ambulation/Gait Yes    Ambulation/Gait Assistance 5: Supervision   cues for gait deviations   Ambulation Distance (Feet) 100 Feet    Assistive device None    Gait Pattern Decreased step length - left;Decreased stance time - left;Decreased hip/knee flexion - left    Ambulation Surface Level;Indoor      Self-Care   Self-Care Scar Mobilizations;Heat/Ice Application    Scar Mobilizations PT instructed while performing scar mobs and pt return demo understanding.     Heat/Ice Application PT performed and instructed in ice massage to edematous, painful & scar areas. Pt verbalized understanding.       Exercises   Exercises Knee/Hip      Knee/Hip Exercises: Stretches   Active Hamstring Stretch Left;2 reps;30 seconds    Active Hamstring Stretch Limitations seated with strap    Knee: Self-Stretch to increase Flexion Left;3 reps;30 seconds    Knee: Self-Stretch Limitations  seated weight shift off left pelvis, maximal knee flexion, then weight to left pelvis    Gastroc Stretch Left;3 reps;30 seconds    Gastroc Stretch Limitations seated with strap    Soleus Stretch Left;2 reps;30 seconds    Soleus Stretch Limitations seated with strap    Other Knee/Hip Stretches prone hang with knees & distal over edge of mat table      Knee/Hip Exercises: Aerobic   Stationary Bike seat 7 for 7 minutes with initial 2 rocking forward / back until knee range for full rotation.      Knee/Hip Exercises: Standing   Knee Flexion --    Knee Flexion Limitations --      Knee/Hip Exercises: Seated   Long Arc Quad --    Long Arc Quad Limitations --    Sit to [employer] --      Knee/Hip Exercises: Supine   Quad Sets AROM;Left;2 sets;10 reps    Quad Sets Limitations ankle on  towel roll    Straight Leg Raises Strengthening;Left;2 sets;10 reps    Straight Leg Raises Limitations cues on slow controlled motion & keeping knee straight      Knee/Hip Exercises: Prone   Hamstring Curl 1 set;10 reps;5 seconds    Hamstring Curl Limitations cues on technique    Prone Knee Hang 5 minutes    Prone Knee Hang Weights (lbs) 0      Modalities   Modalities Vasopneumatic      Vasopneumatic   Number Minutes Vasopneumatic  10 minutes    Vasopnuematic Location  Knee    Vasopneumatic Pressure High    Vasopneumatic Temperature  34      Manual Therapy   Manual therapy comments --                  PT Education - 08/23/19 1510    Education Details Medbridge Access  Code: [email or web address]    proper elevation for edema 20-30 minutes 2-3 times /day, scar mobilization & ice massage    Person(s) Educated Patient    Methods Explanation;Demonstration;Tactile cues;Verbal cues;Handout    Comprehension Verbalized understanding;Returned demonstration;Verbal cues required;Tactile cues required;Need further instruction               PT Long Term Goals - 08/18/19 1145      PT LONG TERM GOAL #1   Title Pt will be I and compliant with HEP. (Target for all goals 09/22/19)    Time 4    Period Weeks    Status New      PT LONG TERM GOAL #2   Title Pt will improve Lt hip/knee strength to 5/5 MMT tested in sitting to improve overall funciton    Time 4    Period Weeks    Status New      PT LONG TERM GOAL #3   Title Pt will improve Lt knee AROM 0-120 deg to improve function.    Time 4    Period Weeks    Status New      PT LONG TERM GOAL #4   Title Pt will be able to ambulate community distances independently without AD,  1000 ft on even/uneven surfaces and negotiate at least one flight of stairs.    Time 4    Period Weeks    Status New                 Plan - 08/23/19 1508    Clinical Impression Statement PT updated HEP and patient appears to  have general  understanding. PT also instructed in scar mobilizations & ice massage which patient reported it helped.    Examination-Activity Limitations Bend;Carry;Squat;Stairs;Lift;Stand;Locomotion Level    Examination-Participation Restrictions Cleaning;Community Activity;Laundry;Shop    Stability/Clinical Decision Making Stable/Uncomplicated    Rehab Potential Good    PT Frequency 3x / week   2-3   PT Duration 4 weeks   4-5 weeks   PT Treatment/Interventions ADLs/Self Care Home Management;Cryotherapy;Electrical Stimulation;Iontophoresis 4mg /ml Dexamethasone;Moist Heat;Ultrasound;Gait training;Stair training;Therapeutic activities;Therapeutic exercise;Balance training;Neuromuscular re-education;Patient/family education;Manual techniques;Passive range of motion;Dry needling;Joint Manipulations;Vasopneumatic Device;Taping    PT Next Visit Plan Begin with bike or nu step, per MD "WBAT avoid excessive loading x 2 weeks (ie sit ups) Due to allograft with reconstruction of tibial plateau and femoral condyle", PT interprets "sit ups" as sit to stand exercises that would cause excessive loading.    PT Home Exercise Plan Access  Code: [email or web address], added tailgate knee flexion stretch with OP    Consulted and Agree with Plan of Care Patient           Patient will benefit from skilled therapeutic intervention in order to improve the following deficits and impairments:  Abnormal gait, Decreased activity tolerance, Decreased balance, Decreased strength, Decreased range of motion, Difficulty walking, Increased edema, Impaired flexibility, Pain  Visit Diagnosis: Acute pain of left knee  Stiffness of left knee, not elsewhere classified  Muscle weakness (generalized)  Other abnormalities of gait and mobility  Localized edema     Problem List Patient Active Problem List   Diagnosis Date Noted  . Arthritis of left knee 08/01/2019  . Type 2 diabetes mellitus without complication, without long-term current use of  insulin ([place]) 05/01/2019  . Dyspnea on exertion 04/10/2019  . Abnormal nuclear stress test 04/10/2019  . Resistant hypertension 02/27/2019  . Acute pain of left knee 02/27/2019    Jamey Reas PT, DPT 08/23/2019, 3:11 PM  [facility] [address] [place], [place], [address] Phone: [phone]   Fax:  [phone]  Name: Joe Stewart MRN: [id number] Date of Birth: [birth year]

## 2019-08-24 ENCOUNTER — Other Ambulatory Visit: Payer: Self-pay | Admitting: Surgical

## 2019-08-24 ENCOUNTER — Encounter: Payer: Self-pay | Admitting: Physical Therapy

## 2019-08-24 ENCOUNTER — Ambulatory Visit (INDEPENDENT_AMBULATORY_CARE_PROVIDER_SITE_OTHER): Payer: 59 | Admitting: Physical Therapy

## 2019-08-24 DIAGNOSIS — M25562 Pain in left knee: Secondary | ICD-10-CM | POA: Diagnosis not present

## 2019-08-24 DIAGNOSIS — M6281 Muscle weakness (generalized): Secondary | ICD-10-CM | POA: Diagnosis not present

## 2019-08-24 DIAGNOSIS — R2689 Other abnormalities of gait and mobility: Secondary | ICD-10-CM

## 2019-08-24 DIAGNOSIS — M25662 Stiffness of left knee, not elsewhere classified: Secondary | ICD-10-CM | POA: Diagnosis not present

## 2019-08-24 DIAGNOSIS — R600 Localized edema: Secondary | ICD-10-CM

## 2019-08-24 NOTE — Therapy (Signed)
[facility] [address] [place], [place], [address] Phone: [phone]   Fax:  [phone]  Physical Therapy Treatment  Patient Details  Name: Joe Stewart MRN: [id number] Date of Birth: 1955/03/13 Referring Provider (PT): Marlou Sa, MD   Encounter Date: 08/24/2019   PT End of Session - 08/24/19 1259    Visit Number 3    Number of Visits 12    Date for PT Re-Evaluation 09/22/19    PT Start Time 1300    PT Stop Time 1400    PT Time Calculation (min) 60 min    Activity Tolerance Patient tolerated treatment well    Behavior During Therapy [facility] for tasks assessed/performed           Past Medical History:  Diagnosis Date  . Arthritis   . Diabetes mellitus without complication ([place])   . Hyperlipidemia   . Hypertension   . Obesity     Past Surgical History:  Procedure Laterality Date  . EYE SURGERY Left    had to have eye flushed after getting costic sodium in L eye  . HERNIA REPAIR    . HERNIA REPAIR     35 years ago  . LEFT HEART CATH AND CORONARY ANGIOGRAPHY N/A 04/11/2019   Procedure: LEFT HEART CATH AND CORONARY ANGIOGRAPHY;  Surgeon: Adrian Prows, MD;  Location: [place] [facility];  Service: Cardiovascular;  Laterality: N/A;  . RENAL ANGIOGRAPHY Bilateral 04/11/2019   Procedure: RENAL ANGIOGRAPHY;  Surgeon: Adrian Prows, MD;  Location: [place] [facility];  Service: Cardiovascular;  Laterality: Bilateral;  . TOTAL KNEE ARTHROPLASTY Left 08/01/2019   Procedure: LEFT TOTAL KNEE ARTHROPLASTY;  Surgeon: Meredith Pel, MD;  Location: [place];  Service: Orthopedics;  Laterality: Left;    There were no vitals filed for this visit.   Subjective Assessment - 08/24/19 1300    Subjective He reports exercises, ice massage and scar mobilization are going well.    Pertinent History S/P Lt TKA    How long can you sit comfortably? 10 min    How long can you walk comfortably? short grocery store distance probably but not tried it    Patient Stated  Goals hopefully retire and get back to fishing    Currently in Pain? No/denies                             [facility] Adult PT Treatment/Exercise - 08/24/19 1300      Transfers   Transfers Independent with all Transfers      Ambulation/Gait   Ambulation/Gait Yes    Ambulation/Gait Assistance 5: Supervision   cues for gait deviations   Ambulation Distance (Feet) 100 Feet    Assistive device None    Gait Pattern Decreased step length - left;Decreased stance time - left;Decreased hip/knee flexion - left    Ambulation Surface Level;Indoor      Self-Care   Self-Care --    Scar Mobilizations --    Heat/Ice Application --      Exercises   Exercises Knee/Hip      Knee/Hip Exercises: Stretches   Active Hamstring Stretch --    Active Hamstring Stretch Limitations --    Knee: Self-Stretch to increase Flexion --    Knee: Self-Stretch Limitations --    Gastroc Stretch --    Gastroc Stretch Limitations --    Soleus Stretch --    Soleus Stretch Limitations --    Other Knee/Hip Stretches --  Knee/Hip Exercises: Aerobic   Stationary Bike seat 7 for 7 minutes with full rotation      Knee/Hip Exercises: Seated   Long Arc Quad Left;3 sets;10 reps;Weights   no wt 1st set, 3# 2nd & 3rd set   [employer] Weight 3 lbs.    Long [employer] Limitations green band 4th set with PT instructing in alternative set up to this exercise as part of HEP. Pt verbalized understanding & better that previous method    Other Seated Knee/Hip Exercises reclined in chair SLR 15 reps with cues for knee extension prior to ea rep.     Other Seated Knee/Hip Exercises reclined in chair SLR with external rotation 15 reps with cues for knee extension prior to ea rep.     Hamstring Curl Strengthening;Left;15 reps;Other (comment)   green theraband hanging on door knob   Hamstring Limitations PT demo & verbal cues on set-up as alternative to standing theraband in HEP. Pt return demo & verbalized  understanding.       Knee/Hip Exercises: Supine   Quad Sets --    Straight Leg Raises --    Straight Leg Raises Limitations --      Knee/Hip Exercises: Prone   Hamstring Curl 1 set;5 seconds;15 reps   3#   Hamstring Curl Limitations PT overpressure in flexion & pt active knee extension pulling toes to floor with PT stabilizing pelvis    Prone Knee Hang Other (comment)   during all prone exercises   Prone Knee Hang Weights (lbs) 0    Straight Leg Raises Strengthening;Left;15 reps    Straight Leg Raises Limitations cues on technique    Other Prone Exercises knee extended: ankle PF/DF, PF inversion/DF eversion, PF eversion / DF inversion 10 reps ea to work on tibial rotation motion.     Other Prone Exercises knee flexed: Internal rotation to external rotation with PT tactile cues for control & motion. 15 reps.       Modalities   Modalities Vasopneumatic      Vasopneumatic   Number Minutes Vasopneumatic  15 minutes    Vasopnuematic Location  Knee    Vasopneumatic Pressure High    Vasopneumatic Temperature  34      Manual Therapy   Manual therapy comments Left knee mild distraction while prone.  Left knee PROM to tolerance.                        PT Long Term Goals - 08/18/19 1145      PT LONG TERM GOAL #1   Title Pt will be I and compliant with HEP. (Target for all goals 09/22/19)    Time 4    Period Weeks    Status New      PT LONG TERM GOAL #2   Title Pt will improve Lt hip/knee strength to 5/5 MMT tested in sitting to improve overall funciton    Time 4    Period Weeks    Status New      PT LONG TERM GOAL #3   Title Pt will improve Lt knee AROM 0-120 deg to improve function.    Time 4    Period Weeks    Status New      PT LONG TERM GOAL #4   Title Pt will be able to ambulate community distances independently without AD,  1000 ft on even/uneven surfaces and negotiate at least one flight of stairs.    Time 4  Period Weeks    Status New                  Plan - 08/24/19 1300    Clinical Impression Statement PT worked on therapeutic exercises including introducing open chain tibial rotation movements.  PT advanced HEP theraband from red to green as patient reported tying shorter to get resistance.    Examination-Activity Limitations Bend;Carry;Squat;Stairs;Lift;Stand;Locomotion Level    Examination-Participation Restrictions Cleaning;Community Activity;Laundry;Shop    Stability/Clinical Decision Making Stable/Uncomplicated    Rehab Potential Good    PT Frequency 3x / week   2-3   PT Duration 4 weeks   4-5 weeks   PT Treatment/Interventions ADLs/Self Care Home Management;Cryotherapy;Electrical Stimulation;Iontophoresis 4mg /ml Dexamethasone;Moist Heat;Ultrasound;Gait training;Stair training;Therapeutic activities;Therapeutic exercise;Balance training;Neuromuscular re-education;Patient/family education;Manual techniques;Passive range of motion;Dry needling;Joint Manipulations;Vasopneumatic Device;Taping    PT Next Visit Plan Begin with bike or nu step, begin closed chain exercises and weighting exercises as 4 weeks post surgery & 2 weeks of PT which was time period Dr. Marlou Sa wanted PT to wait. End session with vasopneumatic    PT Home Exercise Plan Access  Code: [email or web address], added tailgate knee flexion stretch with OP    Consulted and Agree with Plan of Care Patient           Patient will benefit from skilled therapeutic intervention in order to improve the following deficits and impairments:  Abnormal gait, Decreased activity tolerance, Decreased balance, Decreased strength, Decreased range of motion, Difficulty walking, Increased edema, Impaired flexibility, Pain  Visit Diagnosis: Acute pain of left knee  Stiffness of left knee, not elsewhere classified  Muscle weakness (generalized)  Other abnormalities of gait and mobility  Localized edema     Problem List Patient Active Problem List   Diagnosis Date Noted  .  Arthritis of left knee 08/01/2019  . Type 2 diabetes mellitus without complication, without long-term current use of insulin ([place]) 05/01/2019  . Dyspnea on exertion 04/10/2019  . Abnormal nuclear stress test 04/10/2019  . Resistant hypertension 02/27/2019  . Acute pain of left knee 02/27/2019    Jamey Reas PT, DPT 08/24/2019, 2:13 PM  [facility] Therapy [address] [place], [place], [address] Phone: [phone]   Fax:  [phone]  Name: Joe Stewart MRN: [id number] Date of Birth: 1955/08/06

## 2019-08-24 NOTE — Telephone Encounter (Signed)
Pls advise.  

## 2019-08-29 ENCOUNTER — Encounter: Payer: Self-pay | Admitting: Physical Therapy

## 2019-08-29 ENCOUNTER — Other Ambulatory Visit: Payer: Self-pay

## 2019-08-29 ENCOUNTER — Ambulatory Visit (INDEPENDENT_AMBULATORY_CARE_PROVIDER_SITE_OTHER): Payer: 59 | Admitting: Physical Therapy

## 2019-08-29 DIAGNOSIS — R600 Localized edema: Secondary | ICD-10-CM

## 2019-08-29 DIAGNOSIS — R2689 Other abnormalities of gait and mobility: Secondary | ICD-10-CM | POA: Diagnosis not present

## 2019-08-29 DIAGNOSIS — M25662 Stiffness of left knee, not elsewhere classified: Secondary | ICD-10-CM | POA: Diagnosis not present

## 2019-08-29 DIAGNOSIS — M25562 Pain in left knee: Secondary | ICD-10-CM

## 2019-08-29 DIAGNOSIS — M6281 Muscle weakness (generalized): Secondary | ICD-10-CM

## 2019-08-29 NOTE — Therapy (Signed)
[facility] Physical Therapy [address] [place], [place], [address] Phone: [phone]   Fax:  [phone]  Physical Therapy Treatment  Patient Details  Name: Joe Stewart MRN: [id number] Date of Birth: Sep 14, 1955 Referring Provider (PT): Marlou Sa, MD   Encounter Date: 08/29/2019   PT End of Session - 08/29/19 0930    Visit Number 4    Number of Visits 12    Date for PT Re-Evaluation 09/22/19    PT Start Time 0930    PT Stop Time 1030    PT Time Calculation (min) 60 min    Activity Tolerance Patient tolerated treatment well    Behavior During Therapy [facility] for tasks assessed/performed           Past Medical History:  Diagnosis Date  . Arthritis   . Diabetes mellitus without complication ([place])   . Hyperlipidemia   . Hypertension   . Obesity     Past Surgical History:  Procedure Laterality Date  . EYE SURGERY Left    had to have eye flushed after getting costic sodium in L eye  . HERNIA REPAIR    . HERNIA REPAIR     35 years ago  . LEFT HEART CATH AND CORONARY ANGIOGRAPHY N/A 04/11/2019   Procedure: LEFT HEART CATH AND CORONARY ANGIOGRAPHY;  Surgeon: Adrian Prows, MD;  Location: [place] [facility];  Service: Cardiovascular;  Laterality: N/A;  . RENAL ANGIOGRAPHY Bilateral 04/11/2019   Procedure: RENAL ANGIOGRAPHY;  Surgeon: Adrian Prows, MD;  Location: [place] [facility];  Service: Cardiovascular;  Laterality: Bilateral;  . TOTAL KNEE ARTHROPLASTY Left 08/01/2019   Procedure: LEFT TOTAL KNEE ARTHROPLASTY;  Surgeon: Meredith Pel, MD;  Location: [place];  Service: Orthopedics;  Laterality: Left;    There were no vitals filed for this visit.   Subjective Assessment - 08/29/19 0930    Subjective He has been doing his exercises and using ice massage. No  falls. He got his first Covid vaccination yesterday and is achy this morning.    Pertinent History S/P Lt TKA    How long can you sit comfortably? 10 min    How long can you walk comfortably? short grocery  store distance probably but not tried it    Patient Stated Goals hopefully retire and get back to fishing    Currently in Pain? No/denies                             [facility] Adult PT Treatment/Exercise - 08/29/19 0929      Transfers   Transfers Independent with all Transfers      Ambulation/Gait   Ambulation/Gait Yes    Ambulation/Gait Assistance 5: Supervision   cues for gait deviations   Ambulation Distance (Feet) 100 Feet    Assistive device None    Gait Pattern Decreased step length - left;Decreased stance time - left;Decreased hip/knee flexion - left      Therapeutic Activites    Therapeutic Activities Other Therapeutic Activities    Other Therapeutic Activities PT demo options for getting in/out of his boat as plans to go fishing today. Pt verbalized understanding.       Exercises   Exercises Knee/Hip      Knee/Hip Exercises: Stretches   Active Hamstring Stretch Left;2 reps;30 seconds    Active Hamstring Stretch Limitations seated with strap    Gastroc Stretch Left;2 reps;30 seconds    Gastroc Stretch Limitations leg press trunk 45* LLE  extended with strap      Knee/Hip Exercises: Aerobic   Stationary Bike seat 7 level 4 for 7 minutes with full rotation      Knee/Hip Exercises: Machines for Strengthening   Cybex Knee Extension Batca LLE 10# 15 reps 2 sets 30 sec rest b/w   PT cueing on form    Cybex Knee Flexion Batca LLE 20# 15 reps 2 sets 30 sec rest b/w   PT cueing on form    Cybex Leg Press shuttle leg press BLEs 100# 30 reps  LLE 50# 30 reps      Knee/Hip Exercises: Standing   Terminal Knee Extension Strengthening;Left;15 reps;Theraband;3 sets    Theraband Level (Terminal Knee Extension) Level 3 (Green)    Terminal Knee Extension Limitations 1st set bilateral stance  2nd set terminal stance with heel rise  3rd set initial contact with toe lift    Lateral Step Up Left;1 set;15 reps;Hand Hold: 1;Step Height: 4"    Lateral Step Up Limitations PT  demo & verbal cues on technique    Forward Step Up Left;1 set;15 reps;Hand Hold: 1;Step Height: 4"    Forward Step Up Limitations PT demo & verbal cues on technique    Step Down Left;1 set;15 reps;Hand Hold: 1;Step Height: 4"    Step Down Limitations PT demo & verbal cues on technique    Rocker Board 1 minute    Rocker Board Limitations 15 reps right / left and 15 reps ant/post with BUE light support on //bars      Knee/Hip Exercises: Seated   Long Arc Quad --    Long Arc [employer] --    Long Arc [employer] Limitations --    Other Seated Knee/Hip Exercises --    Other Seated Knee/Hip Exercises --    Hamstring Curl --    Hamstring Limitations --      Knee/Hip Exercises: Prone   Hamstring Curl --    Hamstring Curl Limitations --    Prone Knee Hang --    Prone Knee Hang Weights (lbs) --    Straight Leg Raises --    Straight Leg Raises Limitations --    Other Prone Exercises --    Other Prone Exercises --      Modalities   Modalities Vasopneumatic      Vasopneumatic   Number Minutes Vasopneumatic  15 minutes    Vasopnuematic Location  Knee    Vasopneumatic Pressure High    Vasopneumatic Temperature  34      Manual Therapy   Manual therapy comments Left knee mild distraction while prone.  Left knee PROM to tolerance.                        PT Long Term Goals - 08/18/19 1145      PT LONG TERM GOAL #1   Title Pt will be I and compliant with HEP. (Target for all goals 09/22/19)    Time 4    Period Weeks    Status New      PT LONG TERM GOAL #2   Title Pt will improve Lt hip/knee strength to 5/5 MMT tested in sitting to improve overall funciton    Time 4    Period Weeks    Status New      PT LONG TERM GOAL #3   Title Pt will improve Lt knee AROM 0-120 deg to improve function.    Time 4    Period Weeks  Status New      PT LONG TERM GOAL #4   Title Pt will be able to ambulate community distances independently without AD,  1000 ft on even/uneven surfaces and  negotiate at least one flight of stairs.    Time 4    Period Weeks    Status New                 Plan - 08/29/19 0930    Clinical Impression Statement PT progressed therapuetic exercise to include weight machines & some standing knee control activiiies. He reorts no increased pain with new activities.    Examination-Activity Limitations Bend;Carry;Squat;Stairs;Lift;Stand;Locomotion Level    Examination-Participation Restrictions Cleaning;Community Activity;Laundry;Shop    Stability/Clinical Decision Making Stable/Uncomplicated    Rehab Potential Good    PT Frequency 3x / week   2-3   PT Duration 4 weeks   4-5 weeks   PT Treatment/Interventions ADLs/Self Care Home Management;Cryotherapy;Electrical Stimulation;Iontophoresis 4mg /ml Dexamethasone;Moist Heat;Ultrasound;Gait training;Stair training;Therapeutic activities;Therapeutic exercise;Balance training;Neuromuscular re-education;Patient/family education;Manual techniques;Passive range of motion;Dry needling;Joint Manipulations;Vasopneumatic Device;Taping    PT Next Visit Plan continue with therapuetic exercises including weight machines & closed chain activites.   End session with vasopneumatic    PT Home Exercise Plan Access  Code: [email or web address], added tailgate knee flexion stretch with OP    Consulted and Agree with Plan of Care Patient           Patient will benefit from skilled therapeutic intervention in order to improve the following deficits and impairments:  Abnormal gait, Decreased activity tolerance, Decreased balance, Decreased strength, Decreased range of motion, Difficulty walking, Increased edema, Impaired flexibility, Pain  Visit Diagnosis: Acute pain of left knee  Stiffness of left knee, not elsewhere classified  Muscle weakness (generalized)  Other abnormalities of gait and mobility  Localized edema     Problem List Patient Active Problem List   Diagnosis Date Noted  . Arthritis of left knee 08/01/2019    . Type 2 diabetes mellitus without complication, without long-term current use of insulin ([place]) 05/01/2019  . Dyspnea on exertion 04/10/2019  . Abnormal nuclear stress test 04/10/2019  . Resistant hypertension 02/27/2019  . Acute pain of left knee 02/27/2019    Jamey Reas PT, DPT 08/29/2019, 3:23 PM  [facility] Physical Therapy [address] [place], [place], [address] Phone: [phone]   Fax:  [phone]  Name: Joe Stewart MRN: [id number] Date of Birth: February 24, 1955

## 2019-08-31 ENCOUNTER — Encounter: Payer: Self-pay | Admitting: Physical Therapy

## 2019-08-31 ENCOUNTER — Ambulatory Visit (INDEPENDENT_AMBULATORY_CARE_PROVIDER_SITE_OTHER): Payer: 59 | Admitting: Physical Therapy

## 2019-08-31 ENCOUNTER — Other Ambulatory Visit: Payer: Self-pay

## 2019-08-31 DIAGNOSIS — R2689 Other abnormalities of gait and mobility: Secondary | ICD-10-CM | POA: Diagnosis not present

## 2019-08-31 DIAGNOSIS — R600 Localized edema: Secondary | ICD-10-CM

## 2019-08-31 DIAGNOSIS — M25562 Pain in left knee: Secondary | ICD-10-CM | POA: Diagnosis not present

## 2019-08-31 DIAGNOSIS — M25662 Stiffness of left knee, not elsewhere classified: Secondary | ICD-10-CM | POA: Diagnosis not present

## 2019-08-31 DIAGNOSIS — M6281 Muscle weakness (generalized): Secondary | ICD-10-CM | POA: Diagnosis not present

## 2019-08-31 NOTE — Therapy (Signed)
[facility] Physical Therapy [address] [place], [place], [address] Phone: [phone]   Fax:  [phone]  Physical Therapy Treatment  Patient Details  Name: Joe Stewart MRN: [id number] Date of Birth: 02-23-1955 Referring Provider (PT): Marlou Sa, MD   Encounter Date: 08/31/2019   PT End of Session - 08/31/19 0930    Visit Number 5    Number of Visits 12    Date for PT Re-Evaluation 09/22/19    Authorization Type UHC Medicare    PT Start Time 0930    PT Stop Time 1030    PT Time Calculation (min) 60 min    Activity Tolerance Patient tolerated treatment well    Behavior During Therapy [facility] for tasks assessed/performed           Past Medical History:  Diagnosis Date  . Arthritis   . Diabetes mellitus without complication ([place])   . Hyperlipidemia   . Hypertension   . Obesity     Past Surgical History:  Procedure Laterality Date  . EYE SURGERY Left    had to have eye flushed after getting costic sodium in L eye  . HERNIA REPAIR    . HERNIA REPAIR     35 years ago  . LEFT HEART CATH AND CORONARY ANGIOGRAPHY N/A 04/11/2019   Procedure: LEFT HEART CATH AND CORONARY ANGIOGRAPHY;  Surgeon: Adrian Prows, MD;  Location: [place] [facility];  Service: Cardiovascular;  Laterality: N/A;  . RENAL ANGIOGRAPHY Bilateral 04/11/2019   Procedure: RENAL ANGIOGRAPHY;  Surgeon: Adrian Prows, MD;  Location: [place] [facility];  Service: Cardiovascular;  Laterality: Bilateral;  . TOTAL KNEE ARTHROPLASTY Left 08/01/2019   Procedure: LEFT TOTAL KNEE ARTHROPLASTY;  Surgeon: Meredith Pel, MD;  Location: [place];  Service: Orthopedics;  Laterality: Left;    There were no vitals filed for this visit.   Subjective Assessment - 08/31/19 0930    Subjective He had some aching in hips after last session but could be Covid vaccination.    Pertinent History S/P Lt TKA    How long can you sit comfortably? 10 min    How long can you walk comfortably? short grocery store distance  probably but not tried it    Patient Stated Goals hopefully retire and get back to fishing    Currently in Pain? No/denies              [facility] PT Assessment - 08/31/19 0930      PROM   Left Knee Extension 0    Left Knee Flexion 100                         [facility] Adult PT Treatment/Exercise - 08/31/19 0930      Transfers   Transfers Independent with all Transfers      Ambulation/Gait   Ambulation/Gait Yes    Ambulation/Gait Assistance 5: Supervision   cues for gait deviations   Ambulation Distance (Feet) 100 Feet    Assistive device None    Gait Pattern Decreased step length - left;Decreased stance time - left;Decreased hip/knee flexion - left    Ambulation Surface Level;Indoor      Therapeutic Activites    Therapeutic Activities --    Other Therapeutic Activities --      Exercises   Exercises Knee/Hip      Knee/Hip Exercises: Stretches   Active Hamstring Stretch Left;2 reps;30 seconds    Active Hamstring Stretch Limitations seated with strap  Gastroc Stretch Left;2 reps;30 seconds    Gastroc Stretch Limitations leg press trunk 45* LLE extended with strap      Knee/Hip Exercises: Aerobic   Stationary Bike seat 7 level 4 for 7 minutes with full rotation      Knee/Hip Exercises: Machines for Strengthening   Cybex Knee Extension Batca LLE 10# 15 reps 2 sets 30 sec rest b/w   PT cueing on form    Cybex Knee Flexion Batca LLE 20# 15 reps 2 sets 30 sec rest b/w   PT cueing on form    Cybex Leg Press shuttle leg press BLEs 100# 30 reps  LLE 50# 30 reps      Knee/Hip Exercises: Standing   Terminal Knee Extension Strengthening;Left;Theraband;3 sets   30 reps   Theraband Level (Terminal Knee Extension) Level 3 (Green)    Terminal Knee Extension Limitations 1st set bilateral stance  2nd set terminal stance with heel rise  3rd set initial contact with toe lift    Lateral Step Up Left;1 set;Hand Hold: 1;Step Height: 4";20 reps    Lateral Step Up Limitations PT  demo & verbal cues on technique    Forward Step Up Left;1 set;Hand Hold: 1;Step Height: 4";20 reps    Forward Step Up Limitations PT demo & verbal cues on technique    Step Down Left;1 set;Hand Hold: 1;Step Height: 4";20 reps    Step Down Limitations PT demo & verbal cues on technique    Rocker Board 1 minute    Rocker Board Limitations 15 reps right / left and 15 reps ant/post with BUE light support on //bars      Modalities   Modalities Vasopneumatic      Vasopneumatic   Number Minutes Vasopneumatic  15 minutes    Vasopnuematic Location  Knee    Vasopneumatic Pressure High    Vasopneumatic Temperature  34      Manual Therapy   Manual therapy comments Left knee mild distraction while prone.  Left knee PROM to tolerance.                        PT Long Term Goals - 08/18/19 1145      PT LONG TERM GOAL #1   Title Pt will be I and compliant with HEP. (Target for all goals 09/22/19)    Time 4    Period Weeks    Status New      PT LONG TERM GOAL #2   Title Pt will improve Lt hip/knee strength to 5/5 MMT tested in sitting to improve overall funciton    Time 4    Period Weeks    Status New      PT LONG TERM GOAL #3   Title Pt will improve Lt knee AROM 0-120 deg to improve function.    Time 4    Period Weeks    Status New      PT LONG TERM GOAL #4   Title Pt will be able to ambulate community distances independently without AD,  1000 ft on even/uneven surfaces and negotiate at least one flight of stairs.    Time 4    Period Weeks    Status New                 Plan - 08/31/19 5621    Clinical Impression Statement Patient is tolerating low intensity closed chain activities added this week without increased pain or edema.    Examination-Activity Limitations  Bend;Carry;Squat;Stairs;Lift;Stand;Locomotion Level    Examination-Participation Restrictions Cleaning;Community Activity;Laundry;Shop    Stability/Clinical Decision Making Stable/Uncomplicated     Rehab Potential Good    PT Frequency 3x / week   2-3   PT Duration 4 weeks   4-5 weeks   PT Treatment/Interventions ADLs/Self Care Home Management;Cryotherapy;Electrical Stimulation;Iontophoresis 4mg /ml Dexamethasone;Moist Heat;Ultrasound;Gait training;Stair training;Therapeutic activities;Therapeutic exercise;Balance training;Neuromuscular re-education;Patient/family education;Manual techniques;Passive range of motion;Dry needling;Joint Manipulations;Vasopneumatic Device;Taping    PT Next Visit Plan measure ROM weekly, continue with therapuetic exercises including weight machines & closed chain activites.   End session with vasopneumatic    PT Home Exercise Plan Access  Code: [email or web address], added tailgate knee flexion stretch with OP    Consulted and Agree with Plan of Care Patient           Patient will benefit from skilled therapeutic intervention in order to improve the following deficits and impairments:  Abnormal gait, Decreased activity tolerance, Decreased balance, Decreased strength, Decreased range of motion, Difficulty walking, Increased edema, Impaired flexibility, Pain  Visit Diagnosis: Acute pain of left knee  Stiffness of left knee, not elsewhere classified  Muscle weakness (generalized)  Other abnormalities of gait and mobility  Localized edema     Problem List Patient Active Problem List   Diagnosis Date Noted  . Arthritis of left knee 08/01/2019  . Type 2 diabetes mellitus without complication, without long-term current use of insulin ([place]) 05/01/2019  . Dyspnea on exertion 04/10/2019  . Abnormal nuclear stress test 04/10/2019  . Resistant hypertension 02/27/2019  . Acute pain of left knee 02/27/2019    Jamey Reas PT, DPT 08/31/2019, 10:30 AM  [facility] Physical Therapy [address] [place], [place], [address] Phone: [phone]   Fax:  [phone]  Name: Joe Stewart MRN: [id number] Date of Birth: 1955/05/05

## 2019-09-04 ENCOUNTER — Encounter: Payer: Self-pay | Admitting: Physical Therapy

## 2019-09-04 ENCOUNTER — Ambulatory Visit (INDEPENDENT_AMBULATORY_CARE_PROVIDER_SITE_OTHER): Payer: 59 | Admitting: Physical Therapy

## 2019-09-04 ENCOUNTER — Other Ambulatory Visit: Payer: Self-pay

## 2019-09-04 DIAGNOSIS — M25562 Pain in left knee: Secondary | ICD-10-CM | POA: Diagnosis not present

## 2019-09-04 DIAGNOSIS — R2689 Other abnormalities of gait and mobility: Secondary | ICD-10-CM

## 2019-09-04 DIAGNOSIS — M25662 Stiffness of left knee, not elsewhere classified: Secondary | ICD-10-CM

## 2019-09-04 DIAGNOSIS — M6281 Muscle weakness (generalized): Secondary | ICD-10-CM | POA: Diagnosis not present

## 2019-09-04 DIAGNOSIS — R600 Localized edema: Secondary | ICD-10-CM

## 2019-09-04 NOTE — Therapy (Signed)
[facility] Physical Therapy [address] [place], [place], [address] Phone: [phone]   Fax:  [phone]  Physical Therapy Treatment  Patient Details  Name: Joe Stewart MRN: [id number] Date of Birth: 1955-08-27 Referring Provider (PT): Marlou Sa, MD   Encounter Date: 09/04/2019   PT End of Session - 09/04/19 0940    Visit Number 6    Number of Visits 12    Date for PT Re-Evaluation 09/22/19    Authorization Type UHC Medicare    PT Start Time 0932    PT Stop Time 1020    PT Time Calculation (min) 48 min    Activity Tolerance Patient tolerated treatment well    Behavior During Therapy [facility] for tasks assessed/performed           Past Medical History:  Diagnosis Date   Arthritis    Diabetes mellitus without complication ([place])    Hyperlipidemia    Hypertension    Obesity     Past Surgical History:  Procedure Laterality Date   EYE SURGERY Left    had to have eye flushed after getting costic sodium in L eye   [place]     35 years ago   LEFT HEART CATH AND CORONARY ANGIOGRAPHY N/A 04/11/2019   Procedure: LEFT HEART CATH AND CORONARY ANGIOGRAPHY;  Surgeon: Adrian Prows, MD;  Location: [place] [facility];  Service: Cardiovascular;  Laterality: N/A;   RENAL ANGIOGRAPHY Bilateral 04/11/2019   Procedure: RENAL ANGIOGRAPHY;  Surgeon: Adrian Prows, MD;  Location: [place] [facility];  Service: Cardiovascular;  Laterality: Bilateral;   TOTAL KNEE ARTHROPLASTY Left 08/01/2019   Procedure: LEFT TOTAL KNEE ARTHROPLASTY;  Surgeon: Meredith Pel, MD;  Location: [place];  Service: Orthopedics;  Laterality: Left;    There were no vitals filed for this visit.   Subjective Assessment - 09/04/19 0937    Subjective Pt arriving today reporting no pain. Pt reporting compliance with HEP and execises are going well.    Pertinent History S/P Lt TKA    How long can you sit comfortably? 10 min    How long can you walk comfortably? short grocery  store distance probably but not tried it    Patient Stated Goals hopefully retire and get back to fishing    Currently in Pain? No/denies                             [facility] Adult PT Treatment/Exercise - 09/04/19 0001      Knee/Hip Exercises: Stretches   Active Hamstring Stretch Left;2 reps;30 seconds    Active Hamstring Stretch Limitations seated with strap    Gastroc Stretch Left;2 reps;30 seconds    Gastroc Stretch Limitations slant board    Soleus Stretch Both;2 reps;30 seconds    Soleus Stretch Limitations slant board      Knee/Hip Exercises: Aerobic   Stationary Bike seat 6 level 4 for 10 minutes with full rotation      Knee/Hip Exercises: Machines for Strengthening   Cybex Knee Extension Batca LLE 10# 2 x 15     Cybex Knee Flexion Batca LLE 25 # 2x 15     Cybex Leg Press LLE: 50# 2x15, bilateral LE: 100# 2x15      Knee/Hip Exercises: Standing   Terminal Knee Extension Strengthening;Left;Theraband;3 sets   30 reps   Theraband Level (Terminal Knee Extension) Level 3 (Green)    Lateral Step Up Left;1  set;15 reps;Hand Hold: 1;Step Height: 6"    Forward Step Up Left;15 reps;Hand Hold: 1;Step Height: 6"      Modalities   Modalities Vasopneumatic      Vasopneumatic   Number Minutes Vasopneumatic  10 minutes    Vasopnuematic Location  Knee    Vasopneumatic Pressure High    Vasopneumatic Temperature  34                       PT Long Term Goals - 09/04/19 0943      PT LONG TERM GOAL #1   Title Pt will be I and compliant with HEP. (Target for all goals 09/22/19)    Status On-going      PT LONG TERM GOAL #2   Title Pt will improve Lt hip/knee strength to 5/5 MMT tested in sitting to improve overall funciton    Status On-going      PT LONG TERM GOAL #3   Title Pt will improve Lt knee AROM 0-120 deg to improve function.    Status On-going      PT LONG TERM GOAL #4   Title Pt will be able to ambulate community distances independently without  AD,  1000 ft on even/uneven surfaces and negotiate at least one flight of stairs.    Status On-going                 Plan - 09/04/19 0941    Clinical Impression Statement Pt is tolerating all exercises well focusing on LE strengthening and eccentric control.  Pt reporting no change in pain during session.    Examination-Activity Limitations Bend;Carry;Squat;Stairs;Lift;Stand;Locomotion Level    Examination-Participation Restrictions Cleaning;Community Activity;Laundry;Shop    Stability/Clinical Decision Making Stable/Uncomplicated    Rehab Potential Good    PT Frequency 3x / week    PT Duration 4 weeks    PT Treatment/Interventions ADLs/Self Care Home Management;Cryotherapy;Electrical Stimulation;Iontophoresis 4mg /ml Dexamethasone;Moist Heat;Ultrasound;Gait training;Stair training;Therapeutic activities;Therapeutic exercise;Balance training;Neuromuscular re-education;Patient/family education;Manual techniques;Passive range of motion;Dry needling;Joint Manipulations;Vasopneumatic Device;Taping    PT Next Visit Plan measure ROM weekly, continue with therapuetic exercises including weight machines & closed chain activites.   End session with vasopneumatic    PT Home Exercise Plan Access  Code: [email or web address], added tailgate knee flexion stretch with OP    Consulted and Agree with Plan of Care Patient           Patient will benefit from skilled therapeutic intervention in order to improve the following deficits and impairments:  Abnormal gait, Decreased activity tolerance, Decreased balance, Decreased strength, Decreased range of motion, Difficulty walking, Increased edema, Impaired flexibility, Pain  Visit Diagnosis: Acute pain of left knee  Stiffness of left knee, not elsewhere classified  Muscle weakness (generalized)  Other abnormalities of gait and mobility  Localized edema     Problem List Patient Active Problem List   Diagnosis Date Noted   Arthritis of left knee  08/01/2019   Type 2 diabetes mellitus without complication, without long-term current use of insulin ([place]) 05/01/2019   Dyspnea on exertion 04/10/2019   Abnormal nuclear stress test 04/10/2019   Resistant hypertension 02/27/2019   Acute pain of left knee 02/27/2019    Oretha Caprice, PT, MPT 09/04/2019, 10:14 AM  [facility] Physical Therapy [address] [place], [place], [address] Phone: [phone]   Fax:  [phone]  Name: Joe Stewart MRN: [id number] Date of Birth: [birth year]

## 2019-09-06 ENCOUNTER — Ambulatory Visit (INDEPENDENT_AMBULATORY_CARE_PROVIDER_SITE_OTHER): Payer: 59 | Admitting: Orthopedic Surgery

## 2019-09-06 ENCOUNTER — Encounter: Payer: Self-pay | Admitting: Orthopedic Surgery

## 2019-09-06 DIAGNOSIS — Z96652 Presence of left artificial knee joint: Secondary | ICD-10-CM

## 2019-09-06 NOTE — Progress Notes (Signed)
Post-Op Visit Note   Patient: Joe Stewart           Date of Birth: [birth year]           MRN: [id number] Visit Date: 09/06/2019 PCP: [name], NP   Assessment & Plan:  Chief Complaint:  Chief Complaint  Patient presents with  . Left Knee - Routine Post Op   Visit Diagnoses:  1. Status post total left knee replacement     Plan: Joe Stewart is a 64 year old patient left total knee replacement with allograft tibial plateau reconstruction.  He is doing well with no pain or problems.  Finishes therapy in 2 weeks.  Wants to go out fishing.  On exam he has very good stability to varus valgus stress at 0 30 and 90 degrees.  Range of motion excellent from 0 to about 100 of flexion.  At this time he is getting a polyp out at the end of August.  I had like to see him back in 4 weeks with repeat radiographs at that time.  He will need to have some preop antibiotics before getting that polyp removed.  Stairs are okay for rate.  Likely release him after his next clinic visit.  He wondered about fishing today which I think he should be able to do as long as the water is not too [place].  Follow-Up Instructions: Return in about 4 weeks (around 10/04/2019).   Orders:  No orders of the defined types were placed in this encounter.  No orders of the defined types were placed in this encounter.   Imaging: No results found.  PMFS History: Patient Active Problem List   Diagnosis Date Noted  . Arthritis of left knee 08/01/2019  . Type 2 diabetes mellitus without complication, without long-term current use of insulin ([place]) 05/01/2019  . Dyspnea on exertion 04/10/2019  . Abnormal nuclear stress test 04/10/2019  . Resistant hypertension 02/27/2019  . Acute pain of left knee 02/27/2019   Past Medical History:  Diagnosis Date  . Arthritis   . Diabetes mellitus without complication ([place])   . Hyperlipidemia   . Hypertension   . Obesity     Family History  Problem Relation Age of Onset  . Cancer  Mother   . Diabetes Mother   . COPD Mother   . Heart disease Father   . COPD Sister   . Healthy Brother   . Heart disease Brother   . Healthy Son   . Colon cancer Neg Hx   . Colon polyps Neg Hx   . Esophageal cancer Neg Hx   . Rectal cancer Neg Hx   . Stomach cancer Neg Hx     Past Surgical History:  Procedure Laterality Date  . EYE SURGERY Left    had to have eye flushed after getting costic sodium in L eye  . HERNIA REPAIR    . HERNIA REPAIR     35 years ago  . LEFT HEART CATH AND CORONARY ANGIOGRAPHY N/A 04/11/2019   Procedure: LEFT HEART CATH AND CORONARY ANGIOGRAPHY;  Surgeon: Adrian Prows, MD;  Location: [place] [facility];  Service: Cardiovascular;  Laterality: N/A;  . RENAL ANGIOGRAPHY Bilateral 04/11/2019   Procedure: RENAL ANGIOGRAPHY;  Surgeon: Adrian Prows, MD;  Location: [place] [facility];  Service: Cardiovascular;  Laterality: Bilateral;  . TOTAL KNEE ARTHROPLASTY Left 08/01/2019   Procedure: LEFT TOTAL KNEE ARTHROPLASTY;  Surgeon: Meredith Pel, MD;  Location: [place];  Service: Orthopedics;  Laterality: Left;  Social History   Occupational History  . Not on file  Tobacco Use  . Smoking status: Never Smoker  . Smokeless tobacco: Never Used  Vaping Use  . Vaping Use: Never used  Substance and Sexual Activity  . Alcohol use: Not Currently  . Drug use: Never  . Sexual activity: Not Currently

## 2019-09-08 ENCOUNTER — Ambulatory Visit (INDEPENDENT_AMBULATORY_CARE_PROVIDER_SITE_OTHER): Payer: 59 | Admitting: Physical Therapy

## 2019-09-08 ENCOUNTER — Telehealth: Payer: Self-pay | Admitting: Orthopedic Surgery

## 2019-09-08 ENCOUNTER — Encounter: Payer: Self-pay | Admitting: Physical Therapy

## 2019-09-08 ENCOUNTER — Other Ambulatory Visit: Payer: Self-pay

## 2019-09-08 DIAGNOSIS — R600 Localized edema: Secondary | ICD-10-CM

## 2019-09-08 DIAGNOSIS — M6281 Muscle weakness (generalized): Secondary | ICD-10-CM

## 2019-09-08 DIAGNOSIS — M25562 Pain in left knee: Secondary | ICD-10-CM

## 2019-09-08 DIAGNOSIS — M25662 Stiffness of left knee, not elsewhere classified: Secondary | ICD-10-CM

## 2019-09-08 DIAGNOSIS — R2689 Other abnormalities of gait and mobility: Secondary | ICD-10-CM

## 2019-09-08 NOTE — Therapy (Signed)
[facility] Physical Therapy [address] [place], [place], [address] Phone: [phone]   Fax:  [phone]  Physical Therapy Treatment  Patient Details  Name: Joe Stewart MRN: [id number] Date of Birth: 04/06/1955 Referring Provider (PT): Marlou Sa, MD   Encounter Date: 09/08/2019   PT End of Session - 09/08/19 0927    Visit Number 7    Number of Visits 12    Date for PT Re-Evaluation 09/22/19    Authorization Type UHC Medicare    PT Start Time 0845    PT Stop Time 0926    PT Time Calculation (min) 41 min    Activity Tolerance Patient tolerated treatment well    Behavior During Therapy [facility] for tasks assessed/performed           Past Medical History:  Diagnosis Date  . Arthritis   . Diabetes mellitus without complication ([place])   . Hyperlipidemia   . Hypertension   . Obesity     Past Surgical History:  Procedure Laterality Date  . EYE SURGERY Left    had to have eye flushed after getting costic sodium in L eye  . HERNIA REPAIR    . HERNIA REPAIR     35 years ago  . LEFT HEART CATH AND CORONARY ANGIOGRAPHY N/A 04/11/2019   Procedure: LEFT HEART CATH AND CORONARY ANGIOGRAPHY;  Surgeon: Adrian Prows, MD;  Location: [place] [facility];  Service: Cardiovascular;  Laterality: N/A;  . RENAL ANGIOGRAPHY Bilateral 04/11/2019   Procedure: RENAL ANGIOGRAPHY;  Surgeon: Adrian Prows, MD;  Location: [place] [facility];  Service: Cardiovascular;  Laterality: Bilateral;  . TOTAL KNEE ARTHROPLASTY Left 08/01/2019   Procedure: LEFT TOTAL KNEE ARTHROPLASTY;  Surgeon: Meredith Pel, MD;  Location: [place];  Service: Orthopedics;  Laterality: Left;    There were no vitals filed for this visit.   Subjective Assessment - 09/08/19 0924    Subjective no pain or complaints, has been able to return to fishing without difficulty. Reports he needs to build some stamina    Pertinent History S/P Lt TKA    How long can you sit comfortably? 10 min    How long can you walk  comfortably? short grocery store distance probably but not tried it    Patient Stated Goals hopefully retire and get back to fishing                             [facility] Adult PT Treatment/Exercise - 09/08/19 0001      Knee/Hip Exercises: Stretches   Active Hamstring Stretch Left;3 reps;30 seconds    Active Hamstring Stretch Limitations seated    Gastroc Stretch Both;30 seconds;3 reps    Gastroc Stretch Limitations slant board      Knee/Hip Exercises: Aerobic   Stationary Bike seat 6 level 4 for 10 minutes with full rotation      Knee/Hip Exercises: Machines for Strengthening   Cybex Knee Extension Batca LLE 10# 2 x 15     Cybex Knee Flexion Batca LLE 25 # 2x 15     Cybex Leg Press LLE: 62# 3X12, bilateral LE: 100# 2x15      Knee/Hip Exercises: Standing   Forward Step Up Left;2 sets;10 reps;Hand Hold: 1   6.5 inch curb step   Other Standing Knee Exercises deadlift from floor, 10 lb KB. 2X10  PT Long Term Goals - 09/04/19 0943      PT LONG TERM GOAL #1   Title Pt will be I and compliant with HEP. (Target for all goals 09/22/19)    Status On-going      PT LONG TERM GOAL #2   Title Pt will improve Lt hip/knee strength to 5/5 MMT tested in sitting to improve overall funciton    Status On-going      PT LONG TERM GOAL #3   Title Pt will improve Lt knee AROM 0-120 deg to improve function.    Status On-going      PT LONG TERM GOAL #4   Title Pt will be able to ambulate community distances independently without AD,  1000 ft on even/uneven surfaces and negotiate at least one flight of stairs.    Status On-going                 Plan - 09/08/19 1540    Clinical Impression Statement continued with strength, endurance, ROM with goood tolerance. Still with mild defecits in these areas and will continue to benefit from PT.    Examination-Activity Limitations Bend;Carry;Squat;Stairs;Lift;Stand;Locomotion Level     Examination-Participation Restrictions Cleaning;Community Activity;Laundry;Shop    Stability/Clinical Decision Making Stable/Uncomplicated    Rehab Potential Good    PT Frequency 3x / week    PT Duration 4 weeks    PT Treatment/Interventions ADLs/Self Care Home Management;Cryotherapy;Electrical Stimulation;Iontophoresis 4mg /ml Dexamethasone;Moist Heat;Ultrasound;Gait training;Stair training;Therapeutic activities;Therapeutic exercise;Balance training;Neuromuscular re-education;Patient/family education;Manual techniques;Passive range of motion;Dry needling;Joint Manipulations;Vasopneumatic Device;Taping    PT Next Visit Plan measure ROM weekly, continue with therapuetic exercises including weight machines & closed chain activites.   End session with vasopneumatic    PT Home Exercise Plan Access  Code: [email or web address], added tailgate knee flexion stretch with OP    Consulted and Agree with Plan of Care Patient           Patient will benefit from skilled therapeutic intervention in order to improve the following deficits and impairments:  Abnormal gait, Decreased activity tolerance, Decreased balance, Decreased strength, Decreased range of motion, Difficulty walking, Increased edema, Impaired flexibility, Pain  Visit Diagnosis: Acute pain of left knee  Stiffness of left knee, not elsewhere classified  Muscle weakness (generalized)  Other abnormalities of gait and mobility  Localized edema     Problem List Patient Active Problem List   Diagnosis Date Noted  . Arthritis of left knee 08/01/2019  . Type 2 diabetes mellitus without complication, without long-term current use of insulin ([place]) 05/01/2019  . Dyspnea on exertion 04/10/2019  . Abnormal nuclear stress test 04/10/2019  . Resistant hypertension 02/27/2019  . Acute pain of left knee 02/27/2019    Silvestre Mesi 09/08/2019, 9:29 AM  [facility] Physical Therapy [address] [place], [place],  [address] Phone: [phone]   Fax:  [phone]  Name: Joe Stewart MRN: [id number] Date of Birth: 1955-09-03

## 2019-09-08 NOTE — Telephone Encounter (Signed)
[employer] forms received. Sent to [employer].

## 2019-09-11 ENCOUNTER — Other Ambulatory Visit: Payer: Self-pay

## 2019-09-11 ENCOUNTER — Ambulatory Visit (INDEPENDENT_AMBULATORY_CARE_PROVIDER_SITE_OTHER): Payer: 59 | Admitting: Physical Therapy

## 2019-09-11 DIAGNOSIS — M6281 Muscle weakness (generalized): Secondary | ICD-10-CM

## 2019-09-11 DIAGNOSIS — M25662 Stiffness of left knee, not elsewhere classified: Secondary | ICD-10-CM | POA: Diagnosis not present

## 2019-09-11 DIAGNOSIS — R600 Localized edema: Secondary | ICD-10-CM

## 2019-09-11 DIAGNOSIS — R2689 Other abnormalities of gait and mobility: Secondary | ICD-10-CM | POA: Diagnosis not present

## 2019-09-11 DIAGNOSIS — M25562 Pain in left knee: Secondary | ICD-10-CM | POA: Diagnosis not present

## 2019-09-11 NOTE — Therapy (Signed)
[facility] Physical Therapy [address] [place], [place], [address] Phone: [phone]   Fax:  [phone]  Physical Therapy Treatment  Patient Details  Name: Joe Stewart MRN: [id number] Date of Birth: 29-Jul-1955 Referring Provider (PT): Marlou Sa, MD   Encounter Date: 09/11/2019   PT End of Session - 09/11/19 1034    Visit Number 8    Number of Visits 12    Date for PT Re-Evaluation 09/22/19    Authorization Type UHC Medicare    PT Start Time 7517    PT Stop Time 1055    PT Time Calculation (min) 40 min    Activity Tolerance Patient tolerated treatment well    Behavior During Therapy WFL for tasks assessed/performed           Past Medical History:  Diagnosis Date   Arthritis    Diabetes mellitus without complication ([place])    Hyperlipidemia    Hypertension    Obesity     Past Surgical History:  Procedure Laterality Date   EYE SURGERY Left    had to have eye flushed after getting costic sodium in L eye   [place]     35 years ago   LEFT HEART CATH AND CORONARY ANGIOGRAPHY N/A 04/11/2019   Procedure: LEFT HEART CATH AND CORONARY ANGIOGRAPHY;  Surgeon: Adrian Prows, MD;  Location: [place] [facility];  Service: Cardiovascular;  Laterality: N/A;   RENAL ANGIOGRAPHY Bilateral 04/11/2019   Procedure: RENAL ANGIOGRAPHY;  Surgeon: Adrian Prows, MD;  Location: [place] [facility];  Service: Cardiovascular;  Laterality: Bilateral;   TOTAL KNEE ARTHROPLASTY Left 08/01/2019   Procedure: LEFT TOTAL KNEE ARTHROPLASTY;  Surgeon: Meredith Pel, MD;  Location: [place];  Service: Orthopedics;  Laterality: Left;    There were no vitals filed for this visit.   Subjective Assessment - 09/11/19 1056    Subjective no pain, was sore after last session but now he is not    Pertinent History S/P Lt TKA    How long can you sit comfortably? 10 min    How long can you walk comfortably? short grocery store distance probably but not tried it     Patient Stated Goals hopefully retire and get back to fishing              [facility] PT Assessment - 09/11/19 0001      Assessment   Medical Diagnosis S/P Lt TKA    Referring Provider (PT) Marlou Sa, MD    Onset Date/Surgical Date 08/01/19      AROM   Left Knee Extension 1    Left Knee Flexion 110      PROM   Left Knee Extension 0    Left Knee Flexion 123      Strength   Strength Assessment Site Knee    Right/Left Knee Left    Left Knee Flexion 4+/5    Left Knee Extension 4+/5                         [facility] Adult PT Treatment/Exercise - 09/11/19 0001      Knee/Hip Exercises: Stretches   Active Hamstring Stretch Left;3 reps;30 seconds    Active Hamstring Stretch Limitations --    Knee: Self-Stretch Limitations supine heelslides AAROM with strap 5 sec X 15 reps    Gastroc Stretch Both;30 seconds;3 reps    Gastroc Stretch Limitations slant board      Knee/Hip  Exercises: Aerobic   Stationary Bike seat 6 level 4 for 10 minutes with full rotation      Knee/Hip Exercises: Machines for Strengthening   Cybex Knee Extension Batca LLE 10# 3X10     Cybex Knee Flexion Batca LLE 25 # 2x 15     Cybex Leg Press LLE: 75# 3X10, bilateral LE: 112# 2x15      Knee/Hip Exercises: Standing   Squats TRX squats 3X10   Other Standing Knee Exercises deadlift from floor, 10 lb KB. 10 reps then 5 reps                       PT Long Term Goals - 09/04/19 0943      PT LONG TERM GOAL #1   Title Pt will be I and compliant with HEP. (Target for all goals 09/22/19)    Status On-going      PT LONG TERM GOAL #2   Title Pt will improve Lt hip/knee strength to 5/5 MMT tested in sitting to improve overall funciton    Status On-going      PT LONG TERM GOAL #3   Title Pt will improve Lt knee AROM 0-120 deg to improve function.    Status On-going      PT LONG TERM GOAL #4   Title Pt will be able to ambulate community distances independently without AD,  1000 ft on even/uneven  surfaces and negotiate at least one flight of stairs.    Status On-going                 Plan - 09/11/19 1059    Clinical Impression Statement He is doing really well regaining strength and ROM post op. PT will progress as able    Examination-Activity Limitations Bend;Carry;Squat;Stairs;Lift;Stand;Locomotion Level    Examination-Participation Restrictions Cleaning;Community Activity;Laundry;Shop    Stability/Clinical Decision Making Stable/Uncomplicated    Rehab Potential Good    PT Frequency 3x / week    PT Duration 4 weeks    PT Treatment/Interventions ADLs/Self Care Home Management;Cryotherapy;Electrical Stimulation;Iontophoresis 4mg /ml Dexamethasone;Moist Heat;Ultrasound;Gait training;Stair training;Therapeutic activities;Therapeutic exercise;Balance training;Neuromuscular re-education;Patient/family education;Manual techniques;Passive range of motion;Dry needling;Joint Manipulations;Vasopneumatic Device;Taping    PT Next Visit Plan measure ROM weekly, continue with therapuetic exercises including weight machines & closed chain activites.   End session with vasopneumatic    PT Home Exercise Plan Access  Code: [email or web address], added tailgate knee flexion stretch with OP    Consulted and Agree with Plan of Care Patient           Patient will benefit from skilled therapeutic intervention in order to improve the following deficits and impairments:  Abnormal gait, Decreased activity tolerance, Decreased balance, Decreased strength, Decreased range of motion, Difficulty walking, Increased edema, Impaired flexibility, Pain  Visit Diagnosis: Acute pain of left knee  Stiffness of left knee, not elsewhere classified  Muscle weakness (generalized)  Other abnormalities of gait and mobility  Localized edema     Problem List Patient Active Problem List   Diagnosis Date Noted   Arthritis of left knee 08/01/2019   Type 2 diabetes mellitus without complication, without long-term  current use of insulin ([place]) 05/01/2019   Dyspnea on exertion 04/10/2019   Abnormal nuclear stress test 04/10/2019   Resistant hypertension 02/27/2019   Acute pain of left knee 02/27/2019    Debbe Odea, PT,DPT 09/11/2019, 11:02 AM  [facility] Physical Therapy [address] [place], [place], [address] Phone: [phone]   Fax:  [phone]  Name:  Joe Stewart MRN: [id number] Date of Birth: 28-Oct-1955

## 2019-09-13 ENCOUNTER — Ambulatory Visit (INDEPENDENT_AMBULATORY_CARE_PROVIDER_SITE_OTHER): Payer: 59 | Admitting: Physical Therapy

## 2019-09-13 ENCOUNTER — Other Ambulatory Visit: Payer: Self-pay

## 2019-09-13 ENCOUNTER — Encounter: Payer: Self-pay | Admitting: Physical Therapy

## 2019-09-13 DIAGNOSIS — R600 Localized edema: Secondary | ICD-10-CM

## 2019-09-13 DIAGNOSIS — M25662 Stiffness of left knee, not elsewhere classified: Secondary | ICD-10-CM | POA: Diagnosis not present

## 2019-09-13 DIAGNOSIS — R2689 Other abnormalities of gait and mobility: Secondary | ICD-10-CM | POA: Diagnosis not present

## 2019-09-13 DIAGNOSIS — M6281 Muscle weakness (generalized): Secondary | ICD-10-CM

## 2019-09-13 DIAGNOSIS — M25562 Pain in left knee: Secondary | ICD-10-CM

## 2019-09-13 NOTE — Therapy (Signed)
[facility] Physical Therapy [address] [place], [place], [address] Phone: [phone]   Fax:  [phone]  Physical Therapy Treatment  Patient Details  Name: Joe Stewart MRN: [id number] Date of Birth: 1955-07-15 Referring Provider (PT): Marlou Sa, MD   Encounter Date: 09/13/2019   PT End of Session - 09/13/19 1228    Visit Number 9    Number of Visits 12    Date for PT Re-Evaluation 09/22/19    Authorization Type UHC Medicare    PT Start Time 1010    PT Stop Time 1055    PT Time Calculation (min) 45 min    Activity Tolerance Patient tolerated treatment well    Behavior During Therapy [facility] for tasks assessed/performed           Past Medical History:  Diagnosis Date  . Arthritis   . Diabetes mellitus without complication ([place])   . Hyperlipidemia   . Hypertension   . Obesity     Past Surgical History:  Procedure Laterality Date  . EYE SURGERY Left    had to have eye flushed after getting costic sodium in L eye  . HERNIA REPAIR    . HERNIA REPAIR     35 years ago  . LEFT HEART CATH AND CORONARY ANGIOGRAPHY N/A 04/11/2019   Procedure: LEFT HEART CATH AND CORONARY ANGIOGRAPHY;  Surgeon: Adrian Prows, MD;  Location: [place] [facility];  Service: Cardiovascular;  Laterality: N/A;  . RENAL ANGIOGRAPHY Bilateral 04/11/2019   Procedure: RENAL ANGIOGRAPHY;  Surgeon: Adrian Prows, MD;  Location: [place] [facility];  Service: Cardiovascular;  Laterality: Bilateral;  . TOTAL KNEE ARTHROPLASTY Left 08/01/2019   Procedure: LEFT TOTAL KNEE ARTHROPLASTY;  Surgeon: Meredith Pel, MD;  Location: [place];  Service: Orthopedics;  Laterality: Left;    There were no vitals filed for this visit.   Subjective Assessment - 09/13/19 1010    Subjective No changes in last 2 days. His exercises are going well.    Pertinent History S/P Lt TKA    How long can you sit comfortably? 10 min    How long can you walk comfortably? short grocery store distance probably but not tried it     Patient Stated Goals hopefully retire and get back to fishing    Currently in Pain? No/denies                             [facility] Adult PT Treatment/Exercise - 09/13/19 1010      Knee/Hip Exercises: Stretches   Active Hamstring Stretch Left;3 reps;30 seconds    Knee: Self-Stretch Limitations supine heelslides AAROM with strap 5 sec X 15 reps, seated heelslides with self O.P 10 sec X 10    Gastroc Stretch Both;30 seconds;3 reps    Gastroc Stretch Limitations slant board      Knee/Hip Exercises: Aerobic   Stationary Bike seat 6 level 4 for 10 minutes with full rotation. PT verbal cues to not externally rotate hip as compensation for ROM      Knee/Hip Exercises: Machines for Strengthening   Cybex Knee Extension Batca LLE 15# 3X10     Cybex Knee Flexion Batca LLE 35 # 3x10     Cybex Leg Press LLE: 81# 3X10, bilateral LE: 118# 2x15      Knee/Hip Exercises: Standing   Other Standing Knee Exercises deadlift from floor, 10 lb KB. 10 reps then 5 reps    Other Standing Knee Exercises TRX  squats 3 sets of 10                       PT Long Term Goals - 09/04/19 0943      PT LONG TERM GOAL #1   Title Pt will be I and compliant with HEP. (Target for all goals 09/22/19)    Status On-going      PT LONG TERM GOAL #2   Title Pt will improve Lt hip/knee strength to 5/5 MMT tested in sitting to improve overall funciton    Status On-going      PT LONG TERM GOAL #3   Title Pt will improve Lt knee AROM 0-120 deg to improve function.    Status On-going      PT LONG TERM GOAL #4   Title Pt will be able to ambulate community distances independently without AD,  1000 ft on even/uneven surfaces and negotiate at least one flight of stairs.    Status On-going                 Plan - 09/13/19 1230    Clinical Impression Statement He was able to show improved strength by adding more resistance today with strength training exercises. He will need 10th visit progress  note next visit.    Examination-Activity Limitations Bend;Carry;Squat;Stairs;Lift;Stand;Locomotion Level    Examination-Participation Restrictions Cleaning;Community Activity;Laundry;Shop    Stability/Clinical Decision Making Stable/Uncomplicated    Rehab Potential Good    PT Frequency 3x / week    PT Duration 4 weeks    PT Treatment/Interventions ADLs/Self Care Home Management;Cryotherapy;Electrical Stimulation;Iontophoresis 4mg /ml Dexamethasone;Moist Heat;Ultrasound;Gait training;Stair training;Therapeutic activities;Therapeutic exercise;Balance training;Neuromuscular re-education;Patient/family education;Manual techniques;Passive range of motion;Dry needling;Joint Manipulations;Vasopneumatic Device;Taping    PT Next Visit Plan needs progress note    PT Home Exercise Plan Access  Code: [email or web address], added tailgate knee flexion stretch with OP    Consulted and Agree with Plan of Care Patient           Patient will benefit from skilled therapeutic intervention in order to improve the following deficits and impairments:  Abnormal gait, Decreased activity tolerance, Decreased balance, Decreased strength, Decreased range of motion, Difficulty walking, Increased edema, Impaired flexibility, Pain  Visit Diagnosis: Acute pain of left knee  Stiffness of left knee, not elsewhere classified  Muscle weakness (generalized)  Other abnormalities of gait and mobility  Localized edema     Problem List Patient Active Problem List   Diagnosis Date Noted  . Arthritis of left knee 08/01/2019  . Type 2 diabetes mellitus without complication, without long-term current use of insulin ([place]) 05/01/2019  . Dyspnea on exertion 04/10/2019  . Abnormal nuclear stress test 04/10/2019  . Resistant hypertension 02/27/2019  . Acute pain of left knee 02/27/2019    Silvestre Mesi 09/13/2019, 12:31 PM  [facility] Physical Therapy [address] [place], [place], [address] Phone:  [phone]   Fax:  [phone]  Name: Joe Stewart MRN: [id number] Date of Birth: 07-04-1955

## 2019-09-18 ENCOUNTER — Ambulatory Visit (INDEPENDENT_AMBULATORY_CARE_PROVIDER_SITE_OTHER): Payer: 59 | Admitting: Physical Therapy

## 2019-09-18 ENCOUNTER — Other Ambulatory Visit: Payer: Self-pay

## 2019-09-18 ENCOUNTER — Encounter: Payer: Self-pay | Admitting: Physical Therapy

## 2019-09-18 DIAGNOSIS — M6281 Muscle weakness (generalized): Secondary | ICD-10-CM

## 2019-09-18 DIAGNOSIS — M25662 Stiffness of left knee, not elsewhere classified: Secondary | ICD-10-CM | POA: Diagnosis not present

## 2019-09-18 DIAGNOSIS — R2689 Other abnormalities of gait and mobility: Secondary | ICD-10-CM

## 2019-09-18 DIAGNOSIS — M25562 Pain in left knee: Secondary | ICD-10-CM

## 2019-09-18 DIAGNOSIS — R600 Localized edema: Secondary | ICD-10-CM

## 2019-09-18 NOTE — Therapy (Signed)
[facility] [address] [place], [place], [address] Phone: [phone]   Fax:  [phone]  Physical Therapy Treatment/Progres note Progress Note reporting period 08/18/19 to 09/18/19  See below for objective and subjective measurements relating to patients progress with PT.   Patient Details  Name: Joe Stewart MRN: [id number] Date of Birth: 21-Sep-1955 Referring Provider (PT): Marlou Sa, MD   Encounter Date: 09/18/2019   PT End of Session - 09/18/19 1029    Visit Number 10    Number of Visits 12    Date for PT Re-Evaluation 09/22/19    Authorization Type UHC Medicare    PT Start Time [phone]    PT Stop Time 1017    PT Time Calculation (min) 40 min    Activity Tolerance Patient tolerated treatment well    Behavior During Therapy [facility] for tasks assessed/performed           Past Medical History:  Diagnosis Date  . Arthritis   . Diabetes mellitus without complication ([place])   . Hyperlipidemia   . Hypertension   . Obesity     Past Surgical History:  Procedure Laterality Date  . EYE SURGERY Left    had to have eye flushed after getting costic sodium in L eye  . HERNIA REPAIR    . HERNIA REPAIR     35 years ago  . LEFT HEART CATH AND CORONARY ANGIOGRAPHY N/A 04/11/2019   Procedure: LEFT HEART CATH AND CORONARY ANGIOGRAPHY;  Surgeon: Adrian Prows, MD;  Location: [place] [facility];  Service: Cardiovascular;  Laterality: N/A;  . RENAL ANGIOGRAPHY Bilateral 04/11/2019   Procedure: RENAL ANGIOGRAPHY;  Surgeon: Adrian Prows, MD;  Location: [place] [facility];  Service: Cardiovascular;  Laterality: Bilateral;  . TOTAL KNEE ARTHROPLASTY Left 08/01/2019   Procedure: LEFT TOTAL KNEE ARTHROPLASTY;  Surgeon: Meredith Pel, MD;  Location: [place];  Service: Orthopedics;  Laterality: Left;    There were no vitals filed for this visit.   Subjective Assessment - 09/18/19 1024    Subjective no pain or complaints with his knee    Pertinent History S/P Lt TKA     How long can you sit comfortably? 10 min    How long can you walk comfortably? short grocery store distance probably but not tried it    Patient Stated Goals hopefully retire and get back to fishing              [facility] PT Assessment - 09/18/19 0001      Assessment   Medical Diagnosis S/P Lt TKA    Referring Provider (PT) Marlou Sa, MD    Onset Date/Surgical Date 08/01/19      AROM   Left Knee Extension 0    Left Knee Flexion 115      PROM   Left Knee Extension 0    Left Knee Flexion 123      Strength   Left Knee Flexion 5/5    Left Knee Extension 5/5                         [facility] Adult PT Treatment/Exercise - 09/18/19 0001      Knee/Hip Exercises: Stretches   Active Hamstring Stretch Left;3 reps;30 seconds    Knee: Self-Stretch Limitations supine heelslides AAROM with strap 5 sec X 15 reps, seated heelslides with self O.P 10 sec X 10    Gastroc Stretch Both;30 seconds;3 reps    Gastroc Stretch Limitations slant board  Knee/Hip Exercises: Aerobic   Stationary Bike seat 6 level 4 for 10 minutes with full rotation. PT verbal cues to not externally rotate hip as compensation for ROM      Knee/Hip Exercises: Machines for Strengthening   Cybex Knee Extension Batca LLE 15# 3X10     Cybex Knee Flexion Batca LLE 35 # 3x10     Cybex Leg Press LLE: 81# 3X10, bilateral LE: 118# 2x15      Knee/Hip Exercises: Standing   Other Standing Knee Exercises TRX squats 3 sets of 10                       PT Long Term Goals - 09/18/19 1033      PT LONG TERM GOAL #1   Title Pt will be I and compliant with HEP. (Target for all goals 09/22/19)    Status On-going      PT LONG TERM GOAL #2   Title Pt will improve Lt hip/knee strength to 5/5 MMT tested in sitting to improve overall funciton    Status Achieved      PT LONG TERM GOAL #3   Title Pt will improve Lt knee AROM 0-120 deg to improve function.    Status Achieved      PT LONG TERM GOAL #4   Title  Pt will be able to ambulate community distances independently without AD,  1000 ft on even/uneven surfaces and negotiate at least one flight of stairs.    Status Achieved                 Plan - 09/18/19 1032    Clinical Impression Statement He is overall doing very well. Will likely only need about 2 more visits then be ready for discharge.    Examination-Activity Limitations Bend;Carry;Squat;Stairs;Lift;Stand;Locomotion Level    Examination-Participation Restrictions Cleaning;Community Activity;Laundry;Shop    Stability/Clinical Decision Making Stable/Uncomplicated    Rehab Potential Good    PT Frequency 3x / week    PT Duration 4 weeks    PT Treatment/Interventions ADLs/Self Care Home Management;Cryotherapy;Electrical Stimulation;Iontophoresis 4mg /ml Dexamethasone;Moist Heat;Ultrasound;Gait training;Stair training;Therapeutic activities;Therapeutic exercise;Balance training;Neuromuscular re-education;Patient/family education;Manual techniques;Passive range of motion;Dry needling;Joint Manipulations;Vasopneumatic Device;Taping    PT Home Exercise Plan Access  Code: [email or web address], added tailgate knee flexion stretch with OP    Consulted and Agree with Plan of Care Patient           Patient will benefit from skilled therapeutic intervention in order to improve the following deficits and impairments:  Abnormal gait, Decreased activity tolerance, Decreased balance, Decreased strength, Decreased range of motion, Difficulty walking, Increased edema, Impaired flexibility, Pain  Visit Diagnosis: Acute pain of left knee  Stiffness of left knee, not elsewhere classified  Muscle weakness (generalized)  Other abnormalities of gait and mobility  Localized edema     Problem List Patient Active Problem List   Diagnosis Date Noted  . Arthritis of left knee 08/01/2019  . Type 2 diabetes mellitus without complication, without long-term current use of insulin ([place]) 05/01/2019  . Dyspnea on  exertion 04/10/2019  . Abnormal nuclear stress test 04/10/2019  . Resistant hypertension 02/27/2019  . Acute pain of left knee 02/27/2019    Debbe Odea 09/18/2019, 10:35 AM  [facility] [address] [place], [place], [address] Phone: [phone]   Fax:  [phone]  Name: Joe Stewart MRN: [id number] Date of Birth: 07/12/1955

## 2019-09-19 ENCOUNTER — Other Ambulatory Visit: Payer: Self-pay | Admitting: Surgical

## 2019-09-19 NOTE — Telephone Encounter (Signed)
Continue

## 2019-09-20 ENCOUNTER — Encounter: Payer: 59 | Admitting: Physical Therapy

## 2019-09-25 ENCOUNTER — Ambulatory Visit (INDEPENDENT_AMBULATORY_CARE_PROVIDER_SITE_OTHER): Payer: 59 | Admitting: Physical Therapy

## 2019-09-25 ENCOUNTER — Other Ambulatory Visit: Payer: Self-pay

## 2019-09-25 DIAGNOSIS — M6281 Muscle weakness (generalized): Secondary | ICD-10-CM

## 2019-09-25 DIAGNOSIS — M25562 Pain in left knee: Secondary | ICD-10-CM

## 2019-09-25 DIAGNOSIS — R2689 Other abnormalities of gait and mobility: Secondary | ICD-10-CM

## 2019-09-25 DIAGNOSIS — M25662 Stiffness of left knee, not elsewhere classified: Secondary | ICD-10-CM

## 2019-09-25 DIAGNOSIS — R600 Localized edema: Secondary | ICD-10-CM

## 2019-09-25 NOTE — Therapy (Signed)
[facility] [address] [place], [place], [address] Phone: [phone]   Fax:  [phone]  Physical Therapy Treatment/Recert  Patient Details  Name: Joe Stewart MRN: [id number] Date of Birth: 1955/04/26 Referring Provider (PT): Marlou Sa, MD   Encounter Date: 09/25/2019   PT End of Session - 09/25/19 0950    Visit Number 11    Number of Visits 18    Date for PT Re-Evaluation 41/93/79   recert on 0/24/09   Authorization Type UHC Medicare    PT Start Time 0925    PT Stop Time 1005    PT Time Calculation (min) 40 min    Activity Tolerance Patient tolerated treatment well    Behavior During Therapy [facility] for tasks assessed/performed           Past Medical History:  Diagnosis Date   Arthritis    Diabetes mellitus without complication ([place])    Hyperlipidemia    Hypertension    Obesity     Past Surgical History:  Procedure Laterality Date   EYE SURGERY Left    had to have eye flushed after getting costic sodium in L eye   [place]     35 years ago   LEFT HEART CATH AND CORONARY ANGIOGRAPHY N/A 04/11/2019   Procedure: LEFT HEART CATH AND CORONARY ANGIOGRAPHY;  Surgeon: Adrian Prows, MD;  Location: [place] [facility];  Service: Cardiovascular;  Laterality: N/A;   RENAL ANGIOGRAPHY Bilateral 04/11/2019   Procedure: RENAL ANGIOGRAPHY;  Surgeon: Adrian Prows, MD;  Location: [place] [facility];  Service: Cardiovascular;  Laterality: Bilateral;   TOTAL KNEE ARTHROPLASTY Left 08/01/2019   Procedure: LEFT TOTAL KNEE ARTHROPLASTY;  Surgeon: Meredith Pel, MD;  Location: [place];  Service: Orthopedics;  Laterality: Left;    There were no vitals filed for this visit.   Subjective Assessment - 09/25/19 0933    Subjective his knee feels good today. Not having pain. Feels like he needs about 3-4 more weeks of PT to maximize his function and build his endurance. He had to cancel last week after feeling sick from covid  vacination    Pertinent History S/P Lt TKA    How long can you sit comfortably? 10 min    How long can you walk comfortably? short grocery store distance probably but not tried it    Patient Stated Goals hopefully retire and get back to fishing              [facility] PT Assessment - 09/25/19 0001      Assessment   Medical Diagnosis S/P Lt TKA    Referring Provider (PT) Marlou Sa, MD    Onset Date/Surgical Date 08/01/19      PROM   Left Knee Extension 0    Left Knee Flexion 123      Strength   Left Knee Flexion 5/5    Left Knee Extension 5/5                         [facility] Adult PT Treatment/Exercise - 09/25/19 0001      Knee/Hip Exercises: Stretches   Active Hamstring Stretch Left;3 reps;30 seconds    Knee: Self-Stretch Limitations standing lunge stretch with foot in chair 10 sec X 10 reps    Gastroc Stretch Both;30 seconds;3 reps    Gastroc Stretch Limitations slant board      Knee/Hip Exercises: Aerobic   Nustep L8 X 10  min      Knee/Hip Exercises: Machines for Strengthening   Cybex Knee Extension Batca LLE 15# 3X10 then 25# bilat 2X10    Cybex Knee Flexion Batca LLE 35 # 2X15    Cybex Leg Press LLE: 81# 2X15, bilateral LE: 125# 2x15      Knee/Hip Exercises: Standing   Stairs up/down 2 flights of stairs in clinic hallway reciprocally one hand raill    Other Standing Knee Exercises deadlift from floor, 10 lb KB 15 reps    Other Standing Knee Exercises TRX squats 3 sets of 10                       PT Long Term Goals - 09/25/19 0956      PT LONG TERM GOAL #1   Title Pt will be I and compliant with HEP. (Target for all goals 09/22/19)    Status On-going    Target Date 10/23/19      PT LONG TERM GOAL #2   Title Pt will improve Lt hip/knee strength to 5/5 MMT tested in sitting to improve overall funciton    Baseline met but missing some functional strength.    Status Partially Met      PT LONG TERM GOAL #3   Title Pt will improve Lt knee AROM  0-120 deg to improve function.    Status Achieved      PT LONG TERM GOAL #4   Title Pt will be able to ambulate community distances independently without AD,  1000 ft on even/uneven surfaces and negotiate at least one flight of stairs.    Baseline has not walked on uneven surfaces that much    Status On-going                 Plan - 09/25/19 0258    Clinical Impression Statement POC date is up today and after discussion with him he feels he needs 3-4 more weeks of PT to improve overall endurance, strength, ROM, and balance. PT is in agreeement that 4 more weeks will allow him to improve in order to meet his functional goals.    Examination-Activity Limitations Bend;Carry;Squat;Stairs;Lift;Stand;Locomotion Level    Examination-Participation Restrictions Cleaning;Community Activity;Laundry;Shop    Stability/Clinical Decision Making Stable/Uncomplicated    Rehab Potential Good    PT Frequency 3x / week    PT Duration 4 weeks    PT Treatment/Interventions ADLs/Self Care Home Management;Cryotherapy;Electrical Stimulation;Iontophoresis 71m/ml Dexamethasone;Moist Heat;Ultrasound;Gait training;Stair training;Therapeutic activities;Therapeutic exercise;Balance training;Neuromuscular re-education;Patient/family education;Manual techniques;Passive range of motion;Dry needling;Joint Manipulations;Vasopneumatic Device;Taping    PT Home Exercise Plan Access  Code: [email or web address] added tailgate knee flexion stretch with OP    Consulted and Agree with Plan of Care Patient           Patient will benefit from skilled therapeutic intervention in order to improve the following deficits and impairments:  Abnormal gait, Decreased activity tolerance, Decreased balance, Decreased strength, Decreased range of motion, Difficulty walking, Increased edema, Impaired flexibility, Pain  Visit Diagnosis: Acute pain of left knee  Stiffness of left knee, not elsewhere classified  Muscle weakness  (generalized)  Other abnormalities of gait and mobility  Localized edema     Problem List Patient Active Problem List   Diagnosis Date Noted   Arthritis of left knee 08/01/2019   Type 2 diabetes mellitus without complication, without long-term current use of insulin ([place] 05/01/2019   Dyspnea on exertion 04/10/2019   Abnormal nuclear stress test 04/10/2019   Resistant hypertension 02/27/2019  Acute pain of left knee 02/27/2019    Silvestre Mesi 09/25/2019, 10:05 AM  [facility] [address] [place], [place], [address] Phone: [phone]   Fax:  [phone]  Name: Joe Stewart MRN: [id number] Date of Birth: 03/06/1955

## 2019-09-27 ENCOUNTER — Ambulatory Visit (INDEPENDENT_AMBULATORY_CARE_PROVIDER_SITE_OTHER): Payer: 59 | Admitting: Physical Therapy

## 2019-09-27 ENCOUNTER — Other Ambulatory Visit: Payer: Self-pay

## 2019-09-27 DIAGNOSIS — M6281 Muscle weakness (generalized): Secondary | ICD-10-CM

## 2019-09-27 DIAGNOSIS — M25562 Pain in left knee: Secondary | ICD-10-CM | POA: Diagnosis not present

## 2019-09-27 DIAGNOSIS — M25662 Stiffness of left knee, not elsewhere classified: Secondary | ICD-10-CM | POA: Diagnosis not present

## 2019-09-27 DIAGNOSIS — R2689 Other abnormalities of gait and mobility: Secondary | ICD-10-CM

## 2019-09-27 DIAGNOSIS — R600 Localized edema: Secondary | ICD-10-CM

## 2019-09-27 NOTE — Therapy (Signed)
[facility] [address] [place], [place], [address] Phone: [phone]   Fax:  [phone]  Physical Therapy Treatment  Patient Details  Name: Joe Stewart MRN: [id number] Date of Birth: Nov 21, 1955 Referring Provider (PT): Marlou Sa, MD   Encounter Date: 09/27/2019   PT End of Session - 09/27/19 0953    Visit Number 12    Number of Visits 18    Date for PT Re-Evaluation 94/49/67   recert on 5/91/63   Authorization Type UHC Medicare    PT Start Time 0930    PT Stop Time 1010    PT Time Calculation (min) 40 min    Activity Tolerance Patient tolerated treatment well    Behavior During Therapy [facility] for tasks assessed/performed           Past Medical History:  Diagnosis Date  . Arthritis   . Diabetes mellitus without complication ([place])   . Hyperlipidemia   . Hypertension   . Obesity     Past Surgical History:  Procedure Laterality Date  . EYE SURGERY Left    had to have eye flushed after getting costic sodium in L eye  . HERNIA REPAIR    . HERNIA REPAIR     35 years ago  . LEFT HEART CATH AND CORONARY ANGIOGRAPHY N/A 04/11/2019   Procedure: LEFT HEART CATH AND CORONARY ANGIOGRAPHY;  Surgeon: Adrian Prows, MD;  Location: [place] [facility];  Service: Cardiovascular;  Laterality: N/A;  . RENAL ANGIOGRAPHY Bilateral 04/11/2019   Procedure: RENAL ANGIOGRAPHY;  Surgeon: Adrian Prows, MD;  Location: [place] [facility];  Service: Cardiovascular;  Laterality: Bilateral;  . TOTAL KNEE ARTHROPLASTY Left 08/01/2019   Procedure: LEFT TOTAL KNEE ARTHROPLASTY;  Surgeon: Meredith Pel, MD;  Location: [place];  Service: Orthopedics;  Laterality: Left;    There were no vitals filed for this visit.   Subjective Assessment - 09/27/19 0952    Subjective He relays no new complaints and not much pain in his knee    Pertinent History S/P Lt TKA    How long can you sit comfortably? 10 min    How long can you walk comfortably? short grocery store distance  probably but not tried it    Patient Stated Goals hopefully retire and get back to fishing                             [facility] Adult PT Treatment/Exercise - 09/27/19 0001      Knee/Hip Exercises: Stretches   Active Hamstring Stretch Left;3 reps;30 seconds    Knee: Self-Stretch Limitations standing lunge stretch with foot in chair 10 sec X 10 reps    Gastroc Stretch Both;30 seconds;3 reps    Gastroc Stretch Limitations slant board      Knee/Hip Exercises: Aerobic   Nustep L8 X 10 min      Knee/Hip Exercises: Machines for Strengthening   Cybex Knee Extension Batca LLE 15# 2X15 then 45# bilat 2X15    Cybex Knee Flexion Batca LLE 45 # 2X15    Cybex Leg Press LLE: 87# 2X15, bilateral LE: 131# 2x15      Knee/Hip Exercises: Standing   Lateral Step Up Left;1 set;15 reps;Hand Hold: 1;Step Height: 6"    Forward Step Up Left;Hand Hold: 1;15 reps;Step Height: 6"    Stairs --    Other Standing Knee Exercises deadlift from floor, 15 lb KB 15 reps    Other Standing Knee Exercises --  PT Long Term Goals - 09/25/19 0956      PT LONG TERM GOAL #1   Title Pt will be I and compliant with HEP. (Target for all goals 09/22/19)    Status On-going    Target Date 10/23/19      PT LONG TERM GOAL #2   Title Pt will improve Lt hip/knee strength to 5/5 MMT tested in sitting to improve overall funciton    Baseline met but missing some functional strength.    Status Partially Met      PT LONG TERM GOAL #3   Title Pt will improve Lt knee AROM 0-120 deg to improve function.    Status Achieved      PT LONG TERM GOAL #4   Title Pt will be able to ambulate community distances independently without AD,  1000 ft on even/uneven surfaces and negotiate at least one flight of stairs.    Baseline has not walked on uneven surfaces that much    Status On-going                 Plan - 09/27/19 0954    Clinical Impression Statement Progressed overall  strength and endurance today with good tolerance, these are really his only remaining deficits. Continue POC    Examination-Activity Limitations Bend;Carry;Squat;Stairs;Lift;Stand;Locomotion Level    Examination-Participation Restrictions Cleaning;Community Activity;Laundry;Shop    Stability/Clinical Decision Making Stable/Uncomplicated    Rehab Potential Good    PT Frequency 3x / week    PT Duration 4 weeks    PT Treatment/Interventions ADLs/Self Care Home Management;Cryotherapy;Electrical Stimulation;Iontophoresis 58m/ml Dexamethasone;Moist Heat;Ultrasound;Gait training;Stair training;Therapeutic activities;Therapeutic exercise;Balance training;Neuromuscular re-education;Patient/family education;Manual techniques;Passive range of motion;Dry needling;Joint Manipulations;Vasopneumatic Device;Taping    PT Home Exercise Plan Access  Code: [email or web address] added tailgate knee flexion stretch with OP    Consulted and Agree with Plan of Care Patient           Patient will benefit from skilled therapeutic intervention in order to improve the following deficits and impairments:  Abnormal gait, Decreased activity tolerance, Decreased balance, Decreased strength, Decreased range of motion, Difficulty walking, Increased edema, Impaired flexibility, Pain  Visit Diagnosis: Acute pain of left knee  Stiffness of left knee, not elsewhere classified  Muscle weakness (generalized)  Other abnormalities of gait and mobility  Localized edema     Problem List Patient Active Problem List   Diagnosis Date Noted  . Arthritis of left knee 08/01/2019  . Type 2 diabetes mellitus without complication, without long-term current use of insulin ([place] 05/01/2019  . Dyspnea on exertion 04/10/2019  . Abnormal nuclear stress test 04/10/2019  . Resistant hypertension 02/27/2019  . Acute pain of left knee 02/27/2019    BDebbe Odea,PT,DPT 09/27/2019, 10:12 AM  [facility] [address] [address]: [phone]  Fax:  [phone] Name: Joe UselmanMRN: [id number] of Birth: 820-Jul-1957

## 2019-09-28 ENCOUNTER — Encounter: Payer: Self-pay | Admitting: Gastroenterology

## 2019-09-28 ENCOUNTER — Ambulatory Visit (INDEPENDENT_AMBULATORY_CARE_PROVIDER_SITE_OTHER): Payer: 59 | Admitting: Gastroenterology

## 2019-09-28 ENCOUNTER — Other Ambulatory Visit (INDEPENDENT_AMBULATORY_CARE_PROVIDER_SITE_OTHER): Payer: 59

## 2019-09-28 VITALS — BP 142/80 | HR 81 | Ht 68.0 in | Wt 249.0 lb

## 2019-09-28 DIAGNOSIS — Z86010 Personal history of colonic polyps: Secondary | ICD-10-CM

## 2019-09-28 DIAGNOSIS — K635 Polyp of colon: Secondary | ICD-10-CM

## 2019-09-28 DIAGNOSIS — R933 Abnormal findings on diagnostic imaging of other parts of digestive tract: Secondary | ICD-10-CM | POA: Diagnosis not present

## 2019-09-28 LAB — CBC
HCT: 35.8 % — ABNORMAL LOW (ref 39.0–52.0)
Hemoglobin: 12.1 g/dL — ABNORMAL LOW (ref 13.0–17.0)
MCHC: 33.7 g/dL (ref 30.0–36.0)
MCV: 89.5 fl (ref 78.0–100.0)
Platelets: 243 10*3/uL (ref 150.0–400.0)
RBC: 4 Mil/uL — ABNORMAL LOW (ref 4.22–5.81)
RDW: 14.6 % (ref 11.5–15.5)
WBC: 7.8 10*3/uL (ref 4.0–10.5)

## 2019-09-28 LAB — BASIC METABOLIC PANEL
BUN: 22 mg/dL (ref 6–23)
CO2: 28 mEq/L (ref 19–32)
Calcium: 9.9 mg/dL (ref 8.4–10.5)
Chloride: 101 mEq/L (ref 96–112)
Creatinine, Ser: 1.04 mg/dL (ref 0.40–1.50)
GFR: 71.44 mL/min (ref >60.00)
Glucose, Bld: 210 mg/dL — ABNORMAL HIGH (ref 70–99)
Potassium: 4.3 mEq/L (ref 3.5–5.1)
Sodium: 137 mEq/L (ref 135–145)

## 2019-09-28 LAB — PROTIME-INR
INR: 1.2 ratio — ABNORMAL HIGH (ref 0.8–1.0)
Prothrombin Time: 13.3 s — ABNORMAL HIGH (ref 9.6–13.1)

## 2019-09-28 MED ORDER — SUPREP BOWEL PREP KIT 17.5-3.13-1.6 GM/177ML PO SOLN
1.0000 | ORAL | 0 refills | Status: DC
Start: 2019-09-28 — End: 2019-11-29

## 2019-09-28 NOTE — Progress Notes (Signed)
[place] VISIT   Primary Care Provider [name], NP [place] [id number] [phone]  Patient Profile: Joe Stewart is a 64 y.o. male with a pmh significant for diabetes, hypertension, hyperlipidemia, obesity, arthritis, colon polyps.  The patient presents to the [facility] for an evaluation and management of problem(s) noted below:  Problem List 1. Cecal polyp   2. History of colon polyps   3. Abnormal colonoscopy     History of Present Illness This is a patient that I met in May of this year for a direct colonoscopy.  This was the patient's first colonoscopy.  Multiple polyps were found.  A large cecal polyp was found.  Is for this reason that the patient returns today to discuss potential advanced polyp resection.  The patient denies any significant changes in his bowel habits.  He denies any blood in his stools.  The significant weight loss unintentionally.  The patient denies any significant abdominal pain.  Patient has no family history of colon cancer or colon polyps.  Patient does not take significant nonsteroidals or BC/Goody powders.  GI Review of Systems Positive as above Negative for dysphagia, odynophagia, nausea, vomiting, change in bowel habits, melena, hematochezia  Review of Systems General: Denies fevers/chills HEENT: Denies oral lesions Cardiovascular: Denies chest pain Pulmonary: Denies shortness of breath Gastroenterological: See HPI Genitourinary: Denies darkened urine Hematological: Denies easy bruising/bleeding Dermatological: Denies jaundice Psychological: Mood is stable   Medications Current Outpatient Medications  Medication Sig Dispense Refill  . amLODipine (NORVASC) 10 MG tablet Take 1 tablet (10 mg total) by mouth in the morning. (Patient taking differently: Take 10 mg by mouth daily. ) 90 tablet 1  . [facility] ASPIRIN ADULT LOW DOSE 81 MG chewable tablet CHEW 1 TABLET (81 MG TOTAL)  BY MOUTH DAILY. 30 tablet 0  . hydrochlorothiazide (HYDRODIURIL) 25 MG tablet Take 25 mg by mouth daily.     . metFORMIN (GLUCOPHAGE) 1000 MG tablet Take 1 tablet (1,000 mg total) by mouth 2 (two) times daily with a meal. 180 tablet 3  . methocarbamol (ROBAXIN) 500 MG tablet Take 1 tablet (500 mg total) by mouth every 8 (eight) hours as needed for muscle spasms. 30 tablet 0  . Sodium Sulfate-Mag Sulfate-KCl (SUTAB) 5161099562 MG TABS Take 1 kit by mouth as directed. 24 tablet 0  . atorvastatin (LIPITOR) 40 MG tablet Take 1 tablet (40 mg total) by mouth daily. (Patient taking differently: Take 40 mg by mouth at bedtime. ) 90 tablet 3  . losartan (COZAAR) 25 MG tablet Take 1 tablet (25 mg total) by mouth every evening. (Patient taking differently: Take 25 mg by mouth at bedtime. ) 90 tablet 3  . metoprolol succinate (TOPROL-XL) 25 MG 24 hr tablet Take 1 tablet (25 mg total) by mouth daily. Take with or immediately following a meal. Hold if systolic blood pressure (top blood pressure number) less than 100 mmHg or heart rate less than 60 bpm (pulse). (Patient taking differently: Take 25 mg by mouth at bedtime. Take with or immediately following a meal. Hold if systolic blood pressure (top blood pressure number) less than 100 mmHg or heart rate less than 60 bpm (pulse).) 90 tablet 1  . Na Sulfate-K Sulfate-Mg Sulf (SUPREP BOWEL PREP KIT) 17.5-3.13-1.6 GM/177ML SOLN Take 1 kit by mouth as directed. For colonoscopy prep 354 mL 0   No current facility-administered medications for this visit.    Allergies No Known Allergies  Histories Past Medical History:  Diagnosis Date  . Arthritis   . Diabetes mellitus without complication ([place])   . Hyperlipidemia   . Hypertension   . Obesity    Past Surgical History:  Procedure Laterality Date  . EYE SURGERY Left    had to have eye flushed after getting costic sodium in L eye  . HERNIA REPAIR    . HERNIA REPAIR     35 years ago  . LEFT HEART CATH AND  CORONARY ANGIOGRAPHY N/A 04/11/2019   Procedure: LEFT HEART CATH AND CORONARY ANGIOGRAPHY;  Surgeon: Adrian Prows, MD;  Location: [place] [place] [facility];  Service: Cardiovascular;  Laterality: N/A;  . RENAL ANGIOGRAPHY Bilateral 04/11/2019   Procedure: RENAL ANGIOGRAPHY;  Surgeon: Adrian Prows, MD;  Location: [place] [facility];  Service: Cardiovascular;  Laterality: Bilateral;  . TOTAL KNEE ARTHROPLASTY Left 08/01/2019   Procedure: LEFT TOTAL KNEE ARTHROPLASTY;  Surgeon: Meredith Pel, MD;  Location: [facility];  Service: Orthopedics;  Laterality: Left;   Social History   Socioeconomic History  . Marital status: Single    Spouse name: Not on file  . Number of children: 1  . Years of education: Not on file  . Highest education level: Not on file  Occupational History  . Not on file  Tobacco Use  . Smoking status: Never Smoker  . Smokeless tobacco: Never Used  Vaping Use  . Vaping Use: Never used  Substance and Sexual Activity  . Alcohol use: Not Currently  . Drug use: Never  . Sexual activity: Not Currently  Other Topics Concern  . Not on file  Social History Narrative  . Not on file   Social Determinants of Health   Financial Resource Strain:   . Difficulty of Paying Living Expenses: Not on file  Food Insecurity:   . Worried About Charity fundraiser in the Last Year: Not on file  . Ran Out of Food in the Last Year: Not on file  Transportation Needs:   . Lack of Transportation (Medical): Not on file  . Lack of Transportation (Non-Medical): Not on file  Physical Activity:   . Days of Exercise per Week: Not on file  . Minutes of Exercise per Session: Not on file  Stress:   . Feeling of Stress : Not on file  Social Connections:   . Frequency of Communication with Friends and Family: Not on file  . Frequency of Social Gatherings with Friends and Family: Not on file  . Attends Religious Services: Not on file  . Active Member of Clubs or Organizations: Not on file  . Attends Theatre manager Meetings: Not on file  . Marital Status: Not on file  Intimate Partner Violence:   . Fear of Current or Ex-Partner: Not on file  . Emotionally Abused: Not on file  . Physically Abused: Not on file  . Sexually Abused: Not on file   Family History  Problem Relation Age of Onset  . Diabetes Mother   . COPD Mother   . Cancer Mother        unknown type   . Heart disease Father   . COPD Sister   . Healthy Brother   . Heart disease Brother   . Healthy Son   . Colon cancer Neg Hx   . Colon polyps Neg Hx   . Esophageal cancer Neg Hx   . Rectal cancer Neg Hx   . Stomach cancer Neg Hx   . Inflammatory bowel disease Neg Hx   .  Liver disease Neg Hx   . Pancreatic cancer Neg Hx    I have reviewed his medical, social, and family history in detail and updated the electronic medical record as necessary.    PHYSICAL EXAMINATION  BP (!) 142/80   Pulse 81   Ht '5\' 8"'  (1.727 m)   Wt 249 lb (112.9 kg)   BMI 37.86 kg/m  Wt Readings from Last 3 Encounters:  09/28/19 249 lb (112.9 kg)  08/01/19 229 lb 4.5 oz (104 kg)  07/28/19 229 lb 3.2 oz (104 kg)  GEN: NAD, appears stated age, doesn't appear chronically ill PSYCH: Cooperative, without pressured speech EYE: Conjunctivae pink, sclerae anicteric ENT: MMM CV: Nontachycardic RESP: No audible wheezing GI: NABS, soft, NT/ND, without rebound or guarding MSK/EXT: No lower extremity edema SKIN: No jaundice NEURO:  Alert & Oriented x 3, no focal deficits   REVIEW OF DATA  I reviewed the following data at the time of this encounter:  GI Procedures and Studies  Jun 20, 2019 colonoscopy - Hemorrhoids found on digital rectal exam. - Stool in the entire examined colon. Lavaged copiously with adequate visualization. - One 35 mm polyp in the cecum. Resection not attempted. - Three 3 to 5 mm polyps at the recto-sigmoid colon and in the ascending colon, removed with a cold snare. Resected and retrieved. - Normal mucosa in the entire  examined colon otherwise. - Non-bleeding non-thrombosed external and internal hemorrhoids.  Laboratory Studies  Reviewed those in epic  Imaging Studies  No relevant studies to review   ASSESSMENT  Mr. Leavell is a 64 y.o. male with a pmh significant for diabetes, hypertension, hyperlipidemia, obesity, arthritis, colon polyps.  The patient is seen today for evaluation and management of:  1. Cecal polyp   2. History of colon polyps   3. Abnormal colonoscopy    Patient is hemodynamically and clinically stable.  Based upon the description and endoscopic pictures I do feel that it is reasonable to pursue an Advanced Polypectomy attempt of the polyp/lesion.  We discussed some of the techniques of advanced polypectomy which include Endoscopic Mucosal Resection, OVESCO Full-Thickness Resection, Endorotor Morcellation, and Tissue Ablation via Fulguration.  The risks and benefits of endoscopic evaluation were discussed with the patient; these include but are not limited to the risk of perforation, infection, bleeding, missed lesions, lack of diagnosis, severe illness requiring hospitalization, as well as anesthesia and sedation related illnesses.  During attempts at advanced resection, the risks of bleeding and perforation/leak are increased as opposed to diagnostic and screening procedures, and that was discussed with the patient as well.   In addition, I explained that with the possible need for piecemeal resection, subsequent short-interval endoscopic evaluation for follow up and potential retreatment of the lesion/area may be necessary.  I did offer, a referral to surgery in order for patient to have opportunity to discuss surgical management/intervention prior to finalizing decision for attempt at endoscopic removal, however, the patient deferred on this.  If, after attempt at removal of the polyp/lesion, it is found that the patient has a complication or that an invasive lesion or malignant lesion is  found, or that the polyp/lesion continues to recur, the patient is aware and understands that surgery may still be indicated/required.  All patient questions were answered, to the best of my ability, and the patient agrees to the aforementioned plan of action with follow-up as indicated.   PLAN  Proceed with obtaining preprocedural labs as outlined below Proceed with scheduling colonoscopy with EMR -  1 week MiraLAX daily then Sutab    Orders Placed This Encounter  Procedures  . Procedural/ Surgical Case Request: COLONOSCOPY WITH PROPOFOL, ENDOSCOPIC MUCOSAL RESECTION  . CBC  . Basic Metabolic Panel (BMET)  . INR/PT  . Ambulatory referral to Gastroenterology    New Prescriptions   NA SULFATE-K SULFATE-MG SULF (SUPREP BOWEL PREP KIT) 17.5-3.13-1.6 GM/177ML SOLN    Take 1 kit by mouth as directed. For colonoscopy prep   Modified Medications   No medications on file    Planned Follow Up No follow-ups on file.   Total Time in Face-to-Face and in Coordination of Care for patient including independent/personal interpretation/review of prior testing, medical history, examination, medication adjustment, communicating results with the patient directly, and documentation with the EHR is 25 minutes.  Justice Britain, MD [facility] Advanced Endoscopy Office # [phone]

## 2019-09-28 NOTE — Patient Instructions (Signed)
START: Miralax- 1 capful daily for 1 week before colonoscopy.   Your provider has requested that you go to the basement level for lab work before leaving today. Press "B" on the elevator. The lab is located at the first door on the left as you exit the elevator.  You have been scheduled for a colonoscopy. Please follow written instructions given to you at your visit today.  Please pick up your prep supplies at the pharmacy within the next 1-3 days. If you use inhalers (even only as needed), please bring them with you on the day of your procedure.   We have sent the following medications to your pharmacy for you to pick up at your convenience: Suprep  If you are age 66 or older, your body mass index should be between 23-30. Your Body mass index is 37.86 kg/m. If this is out of the aforementioned range listed, please consider follow up with your Primary Care Provider.  If you are age 36 or younger, your body mass index should be between 19-25. Your Body mass index is 37.86 kg/m. If this is out of the aformentioned range listed, please consider follow up with your Primary Care Provider.    Due to recent changes in healthcare laws, you may see the results of your imaging and laboratory studies on MyChart before your provider has had a chance to review them.  We understand that in some cases there may be results that are confusing or concerning to you. Not all laboratory results come back in the same time frame and the provider may be waiting for multiple results in order to interpret others.  Please give [place] 48 hours in order for your provider to thoroughly review all the results before contacting the office for clarification of your results.   Thank you for choosing me and [facility].  Dr. Rush Landmark

## 2019-09-30 ENCOUNTER — Encounter: Payer: Self-pay | Admitting: Gastroenterology

## 2019-09-30 DIAGNOSIS — K635 Polyp of colon: Secondary | ICD-10-CM | POA: Insufficient documentation

## 2019-09-30 DIAGNOSIS — Z86010 Personal history of colonic polyps: Secondary | ICD-10-CM | POA: Insufficient documentation

## 2019-09-30 DIAGNOSIS — R933 Abnormal findings on diagnostic imaging of other parts of digestive tract: Secondary | ICD-10-CM | POA: Insufficient documentation

## 2019-10-02 ENCOUNTER — Ambulatory Visit (INDEPENDENT_AMBULATORY_CARE_PROVIDER_SITE_OTHER): Payer: 59 | Admitting: Physical Therapy

## 2019-10-02 ENCOUNTER — Encounter: Payer: Self-pay | Admitting: Physical Therapy

## 2019-10-02 ENCOUNTER — Other Ambulatory Visit: Payer: Self-pay

## 2019-10-02 ENCOUNTER — Telehealth: Payer: Self-pay

## 2019-10-02 ENCOUNTER — Other Ambulatory Visit (INDEPENDENT_AMBULATORY_CARE_PROVIDER_SITE_OTHER): Payer: 59

## 2019-10-02 DIAGNOSIS — D649 Anemia, unspecified: Secondary | ICD-10-CM | POA: Diagnosis not present

## 2019-10-02 DIAGNOSIS — M25662 Stiffness of left knee, not elsewhere classified: Secondary | ICD-10-CM | POA: Diagnosis not present

## 2019-10-02 DIAGNOSIS — M6281 Muscle weakness (generalized): Secondary | ICD-10-CM | POA: Diagnosis not present

## 2019-10-02 DIAGNOSIS — R600 Localized edema: Secondary | ICD-10-CM | POA: Diagnosis not present

## 2019-10-02 DIAGNOSIS — R2689 Other abnormalities of gait and mobility: Secondary | ICD-10-CM

## 2019-10-02 LAB — IBC + FERRITIN
Ferritin: 130.1 ng/mL (ref 22.0–322.0)
Iron: 59 ug/dL (ref 42–165)
Saturation Ratios: 15.6 % — ABNORMAL LOW (ref 20.0–50.0)
Transferrin: 271 mg/dL (ref 212.0–360.0)

## 2019-10-02 LAB — FOLATE: Folate: 13 ng/mL (ref >5.9)

## 2019-10-02 LAB — VITAMIN B12: Vitamin B-12: 177 pg/mL — ABNORMAL LOW (ref 211–911)

## 2019-10-02 NOTE — Telephone Encounter (Signed)
Patient called in requesting relase letter to be sent back to work with ladder  Restrictions and weight restrictions nothing over 100 pounds . Job is asking to come back to work either Friday or next Tuesday . Patient is coming for PT appt Wednesday wants to pick up letter in person

## 2019-10-02 NOTE — Therapy (Signed)
[facility] [address] [place], [place], [address] Phone: [phone]   Fax:  [phone]  Physical Therapy Treatment  Patient Details  Name: Joe Stewart MRN: [id number] Date of Birth: 08/10/1955 Referring Provider (PT): Marlou Sa, MD   Encounter Date: 10/02/2019   PT End of Session - 10/02/19 0852    Visit Number 13    Number of Visits 18    Date for PT Re-Evaluation 82/70/78   recert on 6/75/44   Authorization Type [employer] Medicare    PT Start Time 0845    PT Stop Time 0929    PT Time Calculation (min) 44 min    Activity Tolerance Patient tolerated treatment well    Behavior During Therapy [facility] for tasks assessed/performed           Past Medical History:  Diagnosis Date   Arthritis    Diabetes mellitus without complication ([place])    Hyperlipidemia    Hypertension    Obesity     Past Surgical History:  Procedure Laterality Date   EYE SURGERY Left    had to have eye flushed after getting costic sodium in L eye   [place]     35 years ago   LEFT HEART CATH AND CORONARY ANGIOGRAPHY N/A 04/11/2019   Procedure: LEFT HEART CATH AND CORONARY ANGIOGRAPHY;  Surgeon: Adrian Prows, MD;  Location: [place] [facility];  Service: Cardiovascular;  Laterality: N/A;   RENAL ANGIOGRAPHY Bilateral 04/11/2019   Procedure: RENAL ANGIOGRAPHY;  Surgeon: Adrian Prows, MD;  Location: [facility] [facility];  Service: Cardiovascular;  Laterality: Bilateral;   TOTAL KNEE ARTHROPLASTY Left 08/01/2019   Procedure: LEFT TOTAL KNEE ARTHROPLASTY;  Surgeon: Meredith Pel, MD;  Location: [facility];  Service: Orthopedics;  Laterality: Left;    There were no vitals filed for this visit.   Subjective Assessment - 10/02/19 0845    Subjective He has been doing his exercises.    Pertinent History S/P Lt TKA    How long can you sit comfortably? 10 min    How long can you walk comfortably? short grocery store distance probably but not tried it     Patient Stated Goals hopefully retire and get back to fishing    Currently in Pain? No/denies                             [facility] Adult PT Treatment/Exercise - 10/02/19 0845      Knee/Hip Exercises: Stretches   Active Hamstring Stretch --    Knee: Self-Stretch to increase Flexion Left;5 reps;20 seconds    Knee: Self-Stretch Limitations standing lunge stretch with foot on 2nd step up on stairs    Gastroc Stretch Both;30 seconds;3 reps    Gastroc Stretch Limitations slant board tactile cues on proper weight shift vs flexed trunk      Knee/Hip Exercises: Aerobic   Nustep --      Knee/Hip Exercises: Machines for Strengthening   Cybex Knee Extension Batca LLE 15# 2X15 then 45# bilat 2X15  PT active assist for terminal knee extension for full range and controlled eccentric    Cybex Knee Flexion Batca LLE 45 # 2X15 using arm of machine to maximize knee flexion motion & controlled eccrentric motion    Cybex Leg Press LLE: 93# 2X15, bilateral LE: 131# 2x15      Knee/Hip Exercises: Standing   Lateral Step Up --  Forward Step Up --    Functional Squat 2 sets;10 reps;5 seconds    Functional Squat Limitations holding pulley bar on lowest setting with 55#. Tactile cues on form    Stairs ascend & descend alternating pattern focusing on left knee controlled flexion descending and power extension ascending. 11 steps with single rail support.     Other Standing Knee Exercises walking backwards with pulley bar lowest setting 55# 3-5 steps per rep for 5 reps  Tactile cues on back mechanics and technique.       Knee/Hip Exercises: Supine   Quad Sets Strengthening;AROM;Left    Quad Sets Limitations PT demo & verbal cues on HEP using towel roll under ankle for full range quad set. Pt verbalized understanding.                        PT Long Term Goals - 09/25/19 0956      PT LONG TERM GOAL #1   Title Pt will be I and compliant with HEP. (Target for all goals 09/22/19)     Status On-going    Target Date 10/23/19      PT LONG TERM GOAL #2   Title Pt will improve Lt hip/knee strength to 5/5 MMT tested in sitting to improve overall funciton    Baseline met but missing some functional strength.    Status Partially Met      PT LONG TERM GOAL #3   Title Pt will improve Lt knee AROM 0-120 deg to improve function.    Status Achieved      PT LONG TERM GOAL #4   Title Pt will be able to ambulate community distances independently without AD,  1000 ft on even/uneven surfaces and negotiate at least one flight of stairs.    Baseline has not walked on uneven surfaces that much    Status On-going                 Plan - 10/02/19 1610    Clinical Impression Statement PT session focused on muscle engagement through full available range with exercises. He improved with instruction and repetition.    Examination-Activity Limitations Bend;Carry;Squat;Stairs;Lift;Stand;Locomotion Level    Examination-Participation Restrictions Cleaning;Community Activity;Laundry;Shop    Stability/Clinical Decision Making Stable/Uncomplicated    Rehab Potential Good    PT Frequency 3x / week    PT Duration 4 weeks    PT Treatment/Interventions ADLs/Self Care Home Management;Cryotherapy;Electrical Stimulation;Iontophoresis 4mg /ml Dexamethasone;Moist Heat;Ultrasound;Gait training;Stair training;Therapeutic activities;Therapeutic exercise;Balance training;Neuromuscular re-education;Patient/family education;Manual techniques;Passive range of motion;Dry needling;Joint Manipulations;Vasopneumatic Device;Taping    PT Next Visit Plan therapeutic exercises increasing range and strength thru full range. 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    PT Home Exercise Plan Access  Code: [email or web address], added tailgate knee flexion stretch with OP    Consulted and Agree with Plan of Care Patient           Patient will benefit from skilled therapeutic  intervention in order to improve the following deficits and impairments:  Abnormal gait, Decreased activity tolerance, Decreased balance, Decreased strength, Decreased range of motion, Difficulty walking, Increased edema, Impaired flexibility, Pain  Visit Diagnosis: Stiffness of left knee, not elsewhere classified  Muscle weakness (generalized)  Other abnormalities of gait and mobility  Localized edema     Problem List Patient Active Problem List   Diagnosis Date Noted   Cecal polyp 09/30/2019   History of colon polyps 09/30/2019   Abnormal  colonoscopy 09/30/2019   Arthritis of left knee 08/01/2019   Type 2 diabetes mellitus without complication, without long-term current use of insulin ([place]) 05/01/2019   Dyspnea on exertion 04/10/2019   Abnormal nuclear stress test 04/10/2019   Resistant hypertension 02/27/2019   Acute pain of left knee 02/27/2019    Jamey Reas, PT, DPT 10/02/2019, 9:43 AM  [facility] [address] [place], [place], [address] Phone: [phone]   Fax:  [phone]  Name: Nathin Saran MRN: [id number] Date of Birth: September 15, 1955

## 2019-10-02 NOTE — Telephone Encounter (Signed)
Patient would like to go back to work light duty. Anything under 100lbs lift and stay off of ladders. Would like for note to return Tues. 10/10/2019.  Please advise. OK for note?

## 2019-10-03 NOTE — Telephone Encounter (Signed)
I left voicemail for patient advising he needs follow up appt and repeat xrays prior to returning to work. Asked him to return call to schedule appt for follow up.

## 2019-10-03 NOTE — Telephone Encounter (Signed)
Needs to wait until next visit thx to check xrays

## 2019-10-03 NOTE — Telephone Encounter (Signed)
Appt made for Friday morning.

## 2019-10-04 ENCOUNTER — Ambulatory Visit (INDEPENDENT_AMBULATORY_CARE_PROVIDER_SITE_OTHER): Payer: 59 | Admitting: Physical Therapy

## 2019-10-04 ENCOUNTER — Encounter: Payer: Self-pay | Admitting: Physical Therapy

## 2019-10-04 ENCOUNTER — Other Ambulatory Visit: Payer: Self-pay

## 2019-10-04 DIAGNOSIS — R2689 Other abnormalities of gait and mobility: Secondary | ICD-10-CM | POA: Diagnosis not present

## 2019-10-04 DIAGNOSIS — R600 Localized edema: Secondary | ICD-10-CM

## 2019-10-04 DIAGNOSIS — M25662 Stiffness of left knee, not elsewhere classified: Secondary | ICD-10-CM | POA: Diagnosis not present

## 2019-10-04 DIAGNOSIS — M6281 Muscle weakness (generalized): Secondary | ICD-10-CM | POA: Diagnosis not present

## 2019-10-04 DIAGNOSIS — M25562 Pain in left knee: Secondary | ICD-10-CM

## 2019-10-04 NOTE — Patient Instructions (Signed)
Fitness Plan has 4 components.  1. Endurance - Goal is 20-30 minutes. You can break time up between machines. You can do sets with rest between sets if need. Example 5 minutes work, 2 minutes rest for 3 sets. Recommend machines that are sitting with back support that uses both your arms and your legs at the same time. Or can do walking program & can use assistive device if increases time & quality of your walking.  Treadmill safety- step on & off machine with foot on solid portion not treadmill belt. Use safety lead so belt will stop if you get in trouble. Straddle belt. Turn treadmill on & set to desired speed while you are still straddling the belt. Step on & off belt leading with your good leg. Step off the belt first to adjust speed or stop.  2. Strength - Goal is form & control. Weight machines should have enough weight to have resistance but not so much that you have to strain or cheat. If you have to cheat (poor form), strain or the weight stack hits hard / out of control, then you probably have too much weight.   Goal is to do 15 repetitions for 1-3 sets. Start with 1 set and build up. Rest 30-60 seconds between sets.  Do 2-4 leg machines, 2-4 arm machines & 2-4 trunk machines. Build up to 4-6 leg machines & 4-6 arm machines. Look for pictures to make sure you exercise both sides of arm, leg or trunk. You want to balance out muscle groups that you working.   Leg machines like leg press (can do both legs or each leg by themselves),   At home can do theraband exercises for arms or legs, floor transfers, sit to stand to sit using arms as little as possible, Yoga positions 3.Flexibility - make sure you include arms, legs & trunk. Can do Yoga also 4. Balance- can work in corner with chair in front - head turns, arm motions, eyes closed; place one foot inside cabinet or on 4-6" block to work on one-legged stance,   Try to get to each component 3-5 times per week.  Going to [facility] or fitness center you  want do things you can not do at home.  For example use bikes at [facility] if you don't have one at home. Then on days you don't go to [facility], do exercises at home. Having 2 or more programs like one program for [facility] & one program for home / days that you do not go to [facility].   Recommendations are at least 150 minutes a week of moderate intensity exercise. Try not to go more than 2 days in a row without being active for at least 30 minutes a day. Any activity where you are up and moving is good- Walking, bicycling, stationary bicycling, dancing.  (moderate intensity means to get  a little out of breath). To start you may not tolerate moderate but if it is challenging to you then it is helping.    Do resistance exercise at least 2 times a week.  This can be yoga poses or strength training where you lift your own weight (think leg lifts or toe raises)  or light weights like cans of beans with your arms.    Flexibility and balance exercise- safe stretching and practicing balance is very important to health.

## 2019-10-04 NOTE — Therapy (Addendum)
[facility] Physical Therapy [address] [place], [place], [address] Phone: [phone]   Fax:  [phone]  Physical Therapy Treatment/Discharge addendum PHYSICAL THERAPY DISCHARGE SUMMARY  Visits from Start of Care: 14  Current functional level related to goals / functional outcomes: See below   Remaining deficits: See below   Education / Equipment: HEP Plan:                                                    Patient goals were met. Patient is being discharged due to meeting the stated rehab goals.  ?????  After MD visit, MD advises discharge due to progress  [name], PT, DPT 10/10/19 8:23 AM      Patient Details  Name: Joe Stewart MRN: [id number] Date of Birth: [birth year] Referring Provider (PT): Marlou Sa, MD   Encounter Date: 10/04/2019   PT End of Session - 10/04/19 0848    Visit Number 14    Number of Visits 18    Date for PT Re-Evaluation 03/88/82   recert on 8/00/34   Authorization Type [employer] Medicare    PT Start Time 0845    PT Stop Time 0932    PT Time Calculation (min) 47 min    Activity Tolerance Patient tolerated treatment well    Behavior During Therapy [facility] for tasks assessed/performed           Past Medical History:  Diagnosis Date  . Arthritis   . Diabetes mellitus without complication ([place])   . Hyperlipidemia   . Hypertension   . Obesity     Past Surgical History:  Procedure Laterality Date  . EYE SURGERY Left    had to have eye flushed after getting costic sodium in L eye  . HERNIA REPAIR    . HERNIA REPAIR     35 years ago  . LEFT HEART CATH AND CORONARY ANGIOGRAPHY N/A 04/11/2019   Procedure: LEFT HEART CATH AND CORONARY ANGIOGRAPHY;  Surgeon: Adrian Prows, MD;  Location: [place] [facility];  Service: Cardiovascular;  Laterality: N/A;  . RENAL ANGIOGRAPHY Bilateral 04/11/2019   Procedure: RENAL ANGIOGRAPHY;  Surgeon: Adrian Prows, MD;  Location: [place] [facility];  Service: Cardiovascular;  Laterality: Bilateral;  .  TOTAL KNEE ARTHROPLASTY Left 08/01/2019   Procedure: LEFT TOTAL KNEE ARTHROPLASTY;  Surgeon: Meredith Pel, MD;  Location: [place];  Service: Orthopedics;  Laterality: Left;    There were no vitals filed for this visit.   Subjective Assessment - 10/04/19 0845    Subjective He has been doing his exercises. He is going back to work on Tuesday if he is released this Friday by Dr. Marlou Sa.    Pertinent History S/P Lt TKA    How long can you sit comfortably? 10 min    How long can you walk comfortably? short grocery store distance probably but not tried it    Patient Stated Goals hopefully retire and get back to fishing    Currently in Pain? No/denies                             [facility] Adult PT Treatment/Exercise - 10/04/19 0845      Ambulation/Gait   Ambulation/Gait --      Knee/Hip Exercises: Stretches   Active Hamstring Stretch Left;Right;2 reps;30 seconds  Active Hamstring Stretch Limitations standing forward lean with single UE support    Knee: Self-Stretch to increase Flexion --    Knee: Self-Stretch Limitations --    Gastroc Stretch Both;30 seconds;2 reps    Gastroc Stretch Limitations heel over edge of step      Knee/Hip Exercises: Aerobic   Recumbent Bike Seat 7 level 7 for 10 minutes.       Knee/Hip Exercises: Machines for Strengthening   Cybex Knee Extension Batca LLE 15# 2X15 then 45# bilat 2X15  PT active assist for terminal knee extension for full range and controlled eccentric    Cybex Knee Flexion Batca LLE 45 # 2X15 using arm of machine to maximize knee flexion motion & controlled eccrentric motion    Cybex Leg Press LLE: 100# 2X15, bilateral LE: 137# 2x15      Knee/Hip Exercises: Standing   Hip Flexion Right;10 reps;1 set;Knee straight;Other (comment)   Working of functional stabilization strength LLE   Hip Flexion Limitations 10# pulley weight without UE support    Hip ADduction Strengthening;Right;1 set;10 reps   Working of functional  stabilization strength LL   Hip ADduction Limitations 10# pulley weight without UE support    Hip Abduction Stengthening;Right;1 set;10 reps;Knee straight   Working of functional stabilization strength LL   Abduction Limitations 10# pulley weight without UE support    Hip Extension Stengthening;Right;1 set;10 reps;Knee straight   Working of functional stabilization strength LL   Extension Limitations 10# pulley weight without UE support    Functional Squat --    Functional Squat Limitations --    Stairs --    Other Standing Knee Exercises TRX squats 3 sets of 15; progressed to single leg stance in small squat with goal 5 secs 15 reps 1 set      Knee/Hip Exercises: Supine   Quad Sets --                       PT Long Term Goals - 09/25/19 0956      PT LONG TERM GOAL #1   Title Pt will be I and compliant with HEP. (Target for all goals 09/22/19)    Status On-going    Target Date 10/23/19      PT LONG TERM GOAL #2   Title Pt will improve Lt hip/knee strength to 5/5 MMT tested in sitting to improve overall funciton    Baseline met but missing some functional strength.    Status Partially Met      PT LONG TERM GOAL #3   Title Pt will improve Lt knee AROM 0-120 deg to improve function.    Status Achieved      PT LONG TERM GOAL #4   Title Pt will be able to ambulate community distances independently without AD,  1000 ft on even/uneven surfaces and negotiate at least one flight of stairs.    Baseline has not walked on uneven surfaces that much    Status On-going                 Plan - 10/04/19 0762    Clinical Impression Statement PT educated patient on components of well-rounded ongoing exercise program once PT discharges.  PT added standing kicks 4 ways with right leg with 10# pulley resistance to increase stabilization on left leg.  He was challenged by this activity. PT also increased weights on machines with improved strength.    Examination-Activity  Limitations Bend;Carry;Squat;Stairs;Lift;Stand;Locomotion Level    Examination-Participation  Restrictions Cleaning;Community Activity;Laundry;Shop    Stability/Clinical Decision Making Stable/Uncomplicated    Rehab Potential Good    PT Frequency 3x / week    PT Duration 4 weeks    PT Treatment/Interventions ADLs/Self Care Home Management;Cryotherapy;Electrical Stimulation;Iontophoresis 15m/ml Dexamethasone;Moist Heat;Ultrasound;Gait training;Stair training;Therapeutic activities;Therapeutic exercise;Balance training;Neuromuscular re-education;Patient/family education;Manual techniques;Passive range of motion;Dry needling;Joint Manipulations;Vasopneumatic Device;Taping    PT Next Visit Plan therapeutic exercises increasing range and strength thru full range. Add functional strength    PT Home Exercise Plan Access  Code: [email or web address] added tailgate knee flexion stretch with OP    Consulted and Agree with Plan of Care Patient           Patient will benefit from skilled therapeutic intervention in order to improve the following deficits and impairments:  Abnormal gait, Decreased activity tolerance, Decreased balance, Decreased strength, Decreased range of motion, Difficulty walking, Increased edema, Impaired flexibility, Pain  Visit Diagnosis: Stiffness of left knee, not elsewhere classified  Muscle weakness (generalized)  Other abnormalities of gait and mobility  Localized edema  Acute pain of left knee     Problem List Patient Active Problem List   Diagnosis Date Noted  . Cecal polyp 09/30/2019  . History of colon polyps 09/30/2019  . Abnormal colonoscopy 09/30/2019  . Arthritis of left knee 08/01/2019  . Type 2 diabetes mellitus without complication, without long-term current use of insulin ([place] 05/01/2019  . Dyspnea on exertion 04/10/2019  . Abnormal nuclear stress test 04/10/2019  . Resistant hypertension 02/27/2019  . Acute pain of left knee 02/27/2019    Joe Stewart PT, DPT 10/04/2019, 12:13 PM  [facility] [address] [address]Phone: [phone]  Fax:  [phone] Name: Joe PereiraMRN: [id number] of Birth: [birth year]

## 2019-10-06 ENCOUNTER — Encounter: Payer: Self-pay | Admitting: Orthopedic Surgery

## 2019-10-06 ENCOUNTER — Ambulatory Visit (INDEPENDENT_AMBULATORY_CARE_PROVIDER_SITE_OTHER): Payer: 59 | Admitting: Orthopedic Surgery

## 2019-10-06 ENCOUNTER — Ambulatory Visit (INDEPENDENT_AMBULATORY_CARE_PROVIDER_SITE_OTHER): Payer: 59

## 2019-10-06 VITALS — Ht 68.0 in | Wt 249.0 lb

## 2019-10-06 DIAGNOSIS — Z96652 Presence of left artificial knee joint: Secondary | ICD-10-CM | POA: Diagnosis not present

## 2019-10-06 NOTE — Progress Notes (Signed)
Post-Op Visit Note   Patient: Joe Stewart           Date of Birth: [birth year]           MRN: [id number] Visit Date: 10/06/2019 PCP: [name], NP   Assessment & Plan:  Chief Complaint:  Chief Complaint  Patient presents with  . Left Knee - Follow-up    08/01/2019 Left TKA   Visit Diagnoses:  1. Status post total left knee replacement     Plan: Joe Stewart is a 64 year old patient is about 2 months out left total knee replacement.  He would like to return to work on limited duty at this time.  He is doing well with his knee replacement.  Not taking any pain medicine.  On examination he has full extension and flexion easily to about 110.  No tenderness around the medial or lateral tibial plateau.  Radiographs look good.  At this time I am let him return to work on Monday but no ladders and no lifting more than 25 pounds for the first 3 months back at work.  He does not plan to be there too long.  Follow-up with me as needed.  Follow-Up Instructions: Return if symptoms worsen or fail to improve.   Orders:  Orders Placed This Encounter  Procedures  . XR Knee 1-2 Views Left   No orders of the defined types were placed in this encounter.   Imaging: XR Knee 1-2 Views Left  Result Date: 10/06/2019 AP lateral left knee reviewed.  Total knee prosthesis in good position alignment.  Some loss of mineral density of the bone graft present but no hardware loosening or change in position.   PMFS History: Patient Active Problem List   Diagnosis Date Noted  . Cecal polyp 09/30/2019  . History of colon polyps 09/30/2019  . Abnormal colonoscopy 09/30/2019  . Arthritis of left knee 08/01/2019  . Type 2 diabetes mellitus without complication, without long-term current use of insulin ([place]) 05/01/2019  . Dyspnea on exertion 04/10/2019  . Abnormal nuclear stress test 04/10/2019  . Resistant hypertension 02/27/2019  . Acute pain of left knee 02/27/2019   Past Medical History:  Diagnosis  Date  . Arthritis   . Diabetes mellitus without complication ([place])   . Hyperlipidemia   . Hypertension   . Obesity     Family History  Problem Relation Age of Onset  . Diabetes Mother   . COPD Mother   . Cancer Mother        unknown type   . Heart disease Father   . COPD Sister   . Healthy Brother   . Heart disease Brother   . Healthy Son   . Colon cancer Neg Hx   . Colon polyps Neg Hx   . Esophageal cancer Neg Hx   . Rectal cancer Neg Hx   . Stomach cancer Neg Hx   . Inflammatory bowel disease Neg Hx   . Liver disease Neg Hx   . Pancreatic cancer Neg Hx     Past Surgical History:  Procedure Laterality Date  . EYE SURGERY Left    had to have eye flushed after getting costic sodium in L eye  . HERNIA REPAIR    . HERNIA REPAIR     35 years ago  . LEFT HEART CATH AND CORONARY ANGIOGRAPHY N/A 04/11/2019   Procedure: LEFT HEART CATH AND CORONARY ANGIOGRAPHY;  Surgeon: Adrian Prows, MD;  Location: [place] [facility];  Service: Cardiovascular;  Laterality: N/A;  . RENAL ANGIOGRAPHY Bilateral 04/11/2019   Procedure: RENAL ANGIOGRAPHY;  Surgeon: Adrian Prows, MD;  Location: [place] [facility];  Service: Cardiovascular;  Laterality: Bilateral;  . TOTAL KNEE ARTHROPLASTY Left 08/01/2019   Procedure: LEFT TOTAL KNEE ARTHROPLASTY;  Surgeon: Meredith Pel, MD;  Location: [place];  Service: Orthopedics;  Laterality: Left;   Social History   Occupational History  . Not on file  Tobacco Use  . Smoking status: Never Smoker  . Smokeless tobacco: Never Used  Vaping Use  . Vaping Use: Never used  Substance and Sexual Activity  . Alcohol use: Not Currently  . Drug use: Never  . Sexual activity: Not Currently

## 2019-10-11 ENCOUNTER — Encounter: Payer: 59 | Admitting: Physical Therapy

## 2019-10-12 ENCOUNTER — Other Ambulatory Visit: Payer: Self-pay

## 2019-10-12 DIAGNOSIS — D649 Anemia, unspecified: Secondary | ICD-10-CM

## 2019-10-12 DIAGNOSIS — D513 Other dietary vitamin B12 deficiency anemia: Secondary | ICD-10-CM

## 2019-10-14 ENCOUNTER — Other Ambulatory Visit: Payer: Self-pay | Admitting: Cardiology

## 2019-10-14 DIAGNOSIS — I10 Essential (primary) hypertension: Secondary | ICD-10-CM

## 2019-10-16 ENCOUNTER — Encounter: Payer: 59 | Admitting: Physical Therapy

## 2019-10-18 ENCOUNTER — Encounter: Payer: 59 | Admitting: Physical Therapy

## 2019-10-18 LAB — HM DIABETES EYE EXAM

## 2019-10-19 ENCOUNTER — Other Ambulatory Visit: Payer: Self-pay | Admitting: Surgical

## 2019-10-19 NOTE — Telephone Encounter (Signed)
Please advise. Thanks.  

## 2019-10-23 ENCOUNTER — Encounter: Payer: 59 | Admitting: Physical Therapy

## 2019-10-25 ENCOUNTER — Encounter: Payer: 59 | Admitting: Physical Therapy

## 2019-10-30 NOTE — Progress Notes (Signed)
[facility] Note  11/01/2019     CHIEF COMPLAINT Patient presents for Diabetic Eye Exam   HISTORY OF PRESENT ILLNESS: Joe Stewart is a 64 y.o. male who presents to the clinic today for:   HPI    Diabetic Eye Exam    Vision is stable.  Diabetes characteristics include Type 2.  This started 6 months ago.  Blood sugar level is controlled.  Last Blood Glucose 200.  Last A1C 7.4.  I, the attending physician,  performed the HPI with the patient and updated documentation appropriately.          Comments    Diabetic eye exam: NPDR OU, DME OU per Dr. Rexene Edison.   Dx 6 months ago.  BS normally ranges around 136-150.  Last night he overdid it on snacking.  Vision is stable, uses readers for reading and computer work. Visine prn. Trauma under [facility] an acid in eye.  Had to go in and flush it out in 80's.       Last edited by Bernarda Caffey, MD on 11/01/2019  8:33 AM. (History)    pt is here on the referral of Shirleen Schirmer, PA-C for concern of DME OU, pt states he was dx 6 months ago and was sent to [facility] by his PCP for a DM exam, pt states he was also dx with hypertension at that time, pt is on 4 different BP meds, pt states he wears glasses to read and has a hard time driving at night bc of glare  Referring physician: Shirleen Schirmer, [place], [place] [address]   HISTORICAL INFORMATION:   Selected notes from the [place] Referred by [name], PA LEE: 09.15.21 BCVA OD: 20/20 OS: 20/20 Ocular Hx- NPDR with edema  PMH- DM, HTN    CURRENT MEDICATIONS: No current outpatient medications on file. (Ophthalmic Drugs)   No current facility-administered medications for this visit. (Ophthalmic Drugs)   Current Outpatient Medications (Other)  Medication Sig  . amLODipine (NORVASC) 10 MG tablet Take 1 tablet (10 mg total) by mouth in the morning. (Patient taking differently: Take 10 mg by mouth daily. )  . [facility]  ASPIRIN ADULT LOW DOSE 81 MG chewable tablet CHEW 1 TABLET (81 MG TOTAL) BY MOUTH DAILY.  . hydrochlorothiazide (HYDRODIURIL) 25 MG tablet TAKE 1 TABLET (25 MG TOTAL) BY MOUTH IN THE MORNING.  . metFORMIN (GLUCOPHAGE) 1000 MG tablet Take 1 tablet (1,000 mg total) by mouth 2 (two) times daily with a meal.  . methocarbamol (ROBAXIN) 500 MG tablet Take 1 tablet (500 mg total) by mouth every 8 (eight) hours as needed for muscle spasms.  . Na Sulfate-K Sulfate-Mg Sulf (SUPREP BOWEL PREP KIT) 17.5-3.13-1.6 GM/177ML SOLN Take 1 kit by mouth as directed. For colonoscopy prep  . Sodium Sulfate-Mag Sulfate-KCl (SUTAB) [phone] MG TABS Take 1 kit by mouth as directed.  Marland Kitchen atorvastatin (LIPITOR) 40 MG tablet Take 1 tablet (40 mg total) by mouth daily. (Patient taking differently: Take 40 mg by mouth at bedtime. )  . losartan (COZAAR) 25 MG tablet Take 1 tablet (25 mg total) by mouth every evening. (Patient taking differently: Take 25 mg by mouth at bedtime. )  . metoprolol succinate (TOPROL-XL) 25 MG 24 hr tablet Take 1 tablet (25 mg total) by mouth daily. Take with or immediately following a meal. Hold if systolic blood pressure (top blood pressure number) less than 100 mmHg or heart rate  less than 60 bpm (pulse). (Patient taking differently: Take 25 mg by mouth at bedtime. Take with or immediately following a meal. Hold if systolic blood pressure (top blood pressure number) less than 100 mmHg or heart rate less than 60 bpm (pulse).)   No current facility-administered medications for this visit. (Other)      REVIEW OF SYSTEMS: ROS    Positive for: Musculoskeletal, Endocrine, Eyes   Negative for: Constitutional, Gastrointestinal, Neurological, Skin, Genitourinary, HENT, Cardiovascular, Respiratory, Psychiatric, Allergic/Imm, Heme/Lymph   Last edited by Leonie Douglas, COA on 11/01/2019  8:18 AM. (History)       ALLERGIES No Known Allergies  PAST MEDICAL HISTORY Past Medical History:  Diagnosis Date   . Arthritis   . Diabetes mellitus without complication ([place])   . Hyperlipidemia   . Hypertension   . Obesity    Past Surgical History:  Procedure Laterality Date  . EYE SURGERY Left    had to have eye flushed after getting costic sodium in L eye  . HERNIA REPAIR    . HERNIA REPAIR     35 years ago  . LEFT HEART CATH AND CORONARY ANGIOGRAPHY N/A 04/11/2019   Procedure: LEFT HEART CATH AND CORONARY ANGIOGRAPHY;  Surgeon: Adrian Prows, MD;  Location: [facility];  Service: Cardiovascular;  Laterality: N/A;  . RENAL ANGIOGRAPHY Bilateral 04/11/2019   Procedure: RENAL ANGIOGRAPHY;  Surgeon: Adrian Prows, MD;  Location: [facility];  Service: Cardiovascular;  Laterality: Bilateral;  . TOTAL KNEE ARTHROPLASTY Left 08/01/2019   Procedure: LEFT TOTAL KNEE ARTHROPLASTY;  Surgeon: Meredith Pel, MD;  Location: [facility];  Service: Orthopedics;  Laterality: Left;    FAMILY HISTORY Family History  Problem Relation Age of Onset  . Diabetes Mother   . COPD Mother   . Cancer Mother        unknown type   . Heart disease Father   . COPD Sister   . Healthy Brother   . Heart disease Brother   . Healthy Son   . Colon cancer Neg Hx   . Colon polyps Neg Hx   . Esophageal cancer Neg Hx   . Rectal cancer Neg Hx   . Stomach cancer Neg Hx   . Inflammatory bowel disease Neg Hx   . Liver disease Neg Hx   . Pancreatic cancer Neg Hx     SOCIAL HISTORY Social History   Tobacco Use  . Smoking status: Never Smoker  . Smokeless tobacco: Never Used  Vaping Use  . Vaping Use: Never used  Substance Use Topics  . Alcohol use: Not Currently  . Drug use: Never         OPHTHALMIC EXAM:  Base Eye Exam    Visual Acuity (Snellen - Linear)      Right Left   Dist [place] 20/30 +2 20/30   Dist ph [place] NI NI       Tonometry (Tonopen, 8:29 AM)      Right Left   Pressure 18 16       Pupils      Dark Light Shape React APD   Right 3 2 Round Brisk None   Left 3 2 Round Brisk None        Visual Fields (Counting fingers)      Left Right    Full Full       Extraocular Movement      Right Left    Full Full       Neuro/Psych  Oriented x3: Yes   Mood/Affect: Normal       Dilation    Both eyes: 1.0% Mydriacyl, 2.5% Phenylephrine @ 8:29 AM        Slit Lamp and Fundus Exam    Slit Lamp Exam      Right Left   Lids/Lashes Dermatochalasis - upper lid Dermatochalasis - upper lid   Conjunctiva/Sclera White and quiet White and quiet   Cornea trace Punctate epithelial erosions 1-2+ inferior Punctate epithelial erosions   Anterior Chamber Deep and quiet Deep and quiet   Iris Round and moderately dilated to 6.48m, No NVI Round and moderately dilated to 6.088m No NVI   Lens 2+ Nuclear sclerosis, 2+ Cortical cataract, +Vacuoles 2+ Nuclear sclerosis, 2+ Cortical cataract, +Vacuoles   Vitreous Vitreous syneresis Vitreous syneresis       Fundus Exam      Right Left   Disc Pink and Sharp, +Peripapillary atrophy Pink and Sharp, +Peripapillary atrophy, Compact   C/D Ratio 0.2 0.1   Macula Flat, Blunted foveal reflex, scattered Microaneurysms greatest SN macula with focal cystic changes / edema Flat, Blunted foveal reflex, central cystic changes, scattered MA   Vessels attenuated, Tortuous attenuated, mild tortuousity   Periphery Attached, pigmented cystoid degeneration ST periphery, rare scattered MA/IRH ST periphery, focal pigmented VR tuft at 0900 Attached, scattered MA / IRH greatest posteriorly, focal pigmented CR scarring along distal IT arcade (0130)           Refraction    Manifest Refraction      Sphere Cylinder Axis Dist VA   Right -1.00 Sphere  20/25+1   Left -0.75 +1.00 165 20/25          IMAGING AND PROCEDURES  Imaging and Procedures for 11/01/2019  OCT, Retina - OU - Both Eyes       Right Eye Quality was good. Central Foveal Thickness: 350. Progression has no prior data. Findings include normal foveal contour, intraretinal fluid, no SRF (Focal band of  IRF / edema SN macula).   Left Eye Quality was good. Central Foveal Thickness: 359. Progression has no prior data. Findings include abnormal foveal contour, intraretinal fluid, no SRF, vitreomacular adhesion  (Focal IRF / edema ST fovea).   Notes *Images captured and stored on drive  Diagnosis / Impression:  DME OU OD: Focal band of IRF / edema SN macula OS: Focal IRF / edema ST fovea  Clinical management:  See below  Abbreviations: NFP - Normal foveal profile. CME - cystoid macular edema. PED - pigment epithelial detachment. IRF - intraretinal fluid. SRF - subretinal fluid. EZ - ellipsoid zone. ERM - epiretinal membrane. ORA - outer retinal atrophy. ORT - outer retinal tubulation. SRHM - subretinal hyper-reflective material. IRHM - intraretinal hyper-reflective material        Fluorescein Angiography Optos (Transit OD)       Right Eye   Progression has no prior data. Early phase findings include microaneurysm. Mid/Late phase findings include microaneurysm, leakage (Leaking MA greatest posteriorly; focal perivascular leakage temp periphery; no NV).   Left Eye   Progression has no prior data. Early phase findings include microaneurysm. Mid/Late phase findings include leakage, microaneurysm (No NV).   Notes **Images stored on drive**  Impression: Moderate NPDR Late leaking MA OU -- greatest posteriorly No NV OU OD: mild focal perivascular leakage temporal periphery, late phase                  ASSESSMENT/PLAN:    ICD-10-CM   1. Moderate  nonproliferative diabetic retinopathy of both eyes with macular edema associated with type 2 diabetes mellitus ([place])  R67.8938   2. Retinal edema  H35.81 OCT, Retina - OU - Both Eyes  3. Essential hypertension  I10   4. Hypertensive retinopathy of both eyes  H35.033 Fluorescein Angiography Optos (Transit OD)  5. Combined forms of age-related cataract of both eyes  H25.813     1,2. Moderate Non-proliferative diabetic  retinopathy, OU  - A1c 7.4% on 6.4.21 - The incidence, risk factors for progression, natural history and treatment options for diabetic retinopathy were discussed with patient.   - The need for close monitoring of blood glucose, blood pressure, and serum lipids, avoiding cigarette or any type of tobacco, and the need for long term follow up was also discussed with patient. - BCVA 20/25 OU - exam shows scattered MA and mild edema/cystic changes - FA (09.29.21) shows late leaking MA OU; no NV OU - OCT shows diabetic macular edema, both eyes  - The natural history, pathology, and characteristics of diabetic macular edema discussed with patient.  A generalized discussion of the major clinical trials concerning treatment of diabetic macular edema (ETDRS, DCT, SCORE, RISE / RIDE, and ongoing DRCR net studies) was completed.  This discussion included mention of the various approaches to treating diabetic macular edema (observation, laser photocoagulation, anti-VEGF injections with lucentis / Avastin / Eylea, steroid injections with Kenalog / Ozurdex, and intraocular surgery with vitrectomy).  The goal hemoglobin A1C of 6-7 was discussed, as well as importance of smoking cessation and hypertension control.  Need for ongoing treatment and monitoring were specifically discussed with reference to chronic nature of diabetic macular edema - with vision as good as it is, recommend observation for now - f/u in 3-4 wks -- DFE/OCT, review FA  3,4. Hypertensive retinopathy OU - discussed importance of tight BP control - monitor  5. Mixed Cataract OU - The symptoms of cataract, surgical options, and treatments and risks were discussed with patient. - discussed diagnosis and progression - not yet visually significant - monitor for now   Ophthalmic Meds Ordered this visit:  No orders of the defined types were placed in this encounter.      Return for f/u 3-4 weeks, NPDR OU, DFE, OCT.  There are no Patient  Instructions on file for this visit.   Explained the diagnoses, plan, and follow up with the patient and they expressed understanding.  Patient expressed understanding of the importance of proper follow up care.   This document serves as a record of services personally performed by Gardiner Sleeper, MD, PhD. It was created on their behalf by Roselee Nova, COMT. The creation of this record is the provider's dictation and/or activities during the visit.  Electronically signed by: Roselee Nova, COMT 11/02/19 1:01 AM   This document serves as a record of services personally performed by Gardiner Sleeper, MD, PhD. It was created on their behalf by San Jetty. Owens Shark, OA an ophthalmic technician. The creation of this record is the provider's dictation and/or activities during the visit.    Electronically signed by: San Jetty. Owens Shark, [place] 09.29.2021 1:01 AM  Gardiner Sleeper, M.D., Ph.D. Diseases & Surgery of the Retina and Vitreous [facility]  I have reviewed the above documentation for accuracy and completeness, and I agree with the above. Gardiner Sleeper, M.D., Ph.D. 11/02/19 1:05 AM   Abbreviations: M myopia (nearsighted); A astigmatism; H hyperopia (farsighted); P presbyopia; Mrx spectacle prescription;  CTL contact lenses; OD right eye; OS left eye; OU both eyes  XT exotropia; ET esotropia; PEK punctate epithelial keratitis; PEE punctate epithelial erosions; DES dry eye syndrome; MGD meibomian gland dysfunction; ATs artificial tears; PFAT's preservative free artificial tears; [place] nuclear sclerotic cataract; PSC posterior subcapsular cataract; ERM epi-retinal membrane; PVD posterior vitreous detachment; RD retinal detachment; DM diabetes mellitus; DR diabetic retinopathy; NPDR non-proliferative diabetic retinopathy; PDR proliferative diabetic retinopathy; CSME clinically significant macular edema; DME diabetic macular edema; dbh dot blot hemorrhages; CWS cotton wool spot; POAG  primary open angle glaucoma; C/D cup-to-disc ratio; HVF humphrey visual field; GVF goldmann visual field; OCT optical coherence tomography; IOP intraocular pressure; BRVO Branch retinal vein occlusion; CRVO central retinal vein occlusion; CRAO central retinal artery occlusion; BRAO branch retinal artery occlusion; RT retinal tear; SB scleral buckle; PPV pars plana vitrectomy; VH Vitreous hemorrhage; PRP panretinal laser photocoagulation; IVK intravitreal kenalog; VMT vitreomacular traction; MH Macular hole;  NVD neovascularization of the disc; NVE neovascularization elsewhere; AREDS age related eye disease study; ARMD age related macular degeneration; POAG primary open angle glaucoma; EBMD epithelial/anterior basement membrane dystrophy; ACIOL anterior chamber intraocular lens; IOL intraocular lens; PCIOL posterior chamber intraocular lens; Phaco/IOL phacoemulsification with intraocular lens placement; [place] photorefractive keratectomy; LASIK laser assisted in situ keratomileusis; HTN hypertension; DM diabetes mellitus; COPD chronic obstructive pulmonary disease

## 2019-11-01 ENCOUNTER — Other Ambulatory Visit: Payer: Self-pay

## 2019-11-01 ENCOUNTER — Ambulatory Visit (INDEPENDENT_AMBULATORY_CARE_PROVIDER_SITE_OTHER): Payer: 59 | Admitting: Ophthalmology

## 2019-11-01 ENCOUNTER — Encounter (INDEPENDENT_AMBULATORY_CARE_PROVIDER_SITE_OTHER): Payer: Self-pay | Admitting: Ophthalmology

## 2019-11-01 DIAGNOSIS — E113313 Type 2 diabetes mellitus with moderate nonproliferative diabetic retinopathy with macular edema, bilateral: Secondary | ICD-10-CM

## 2019-11-01 DIAGNOSIS — H25813 Combined forms of age-related cataract, bilateral: Secondary | ICD-10-CM

## 2019-11-01 DIAGNOSIS — I10 Essential (primary) hypertension: Secondary | ICD-10-CM

## 2019-11-01 DIAGNOSIS — H35033 Hypertensive retinopathy, bilateral: Secondary | ICD-10-CM | POA: Diagnosis not present

## 2019-11-01 DIAGNOSIS — H3581 Retinal edema: Secondary | ICD-10-CM | POA: Diagnosis not present

## 2019-11-17 ENCOUNTER — Telehealth: Payer: Self-pay | Admitting: Radiology

## 2019-11-17 ENCOUNTER — Other Ambulatory Visit: Payer: Self-pay | Admitting: Surgical

## 2019-11-17 NOTE — Telephone Encounter (Signed)
E-prescribe down. I called refill to pharmacy.

## 2019-11-17 NOTE — Telephone Encounter (Signed)
Received refill request from pharmacy for [facility]S Aspirin Adult Low Dose 81mg  chewable tablet. Luke approved refill. Called to pharmacy.

## 2019-11-17 NOTE — Telephone Encounter (Signed)
Pls advise.  

## 2019-11-22 NOTE — Progress Notes (Signed)
[facility] Note  11/28/2019     CHIEF COMPLAINT Patient presents for Retina Follow Up   HISTORY OF PRESENT ILLNESS: Joe Stewart is a 64 y.o. male who presents to the clinic today for:  HPI    Retina Follow Up    Patient presents with  Diabetic Retinopathy.  In both eyes.  This started 4 weeks ago.  I, the attending physician,  performed the HPI with the patient and updated documentation appropriately.          Comments    Patient here for 4 weeks retina follow up for NPDR OU. Patient states vision doing good. No eye pain. The other day had scratching on eyeball with pillow. Couldn't hold eye open. Light sensitive. It was gone within a half a day.       Last edited by Bernarda Caffey, MD on 11/28/2019 11:45 AM. (History)    Pt states his blood sugar this morning was 153 and his BP was in the 140's  Referring physician: Shirleen Schirmer, [place], [place] [address]   HISTORICAL INFORMATION:   Selected notes from the [facility] Referred by [name], PA LEE: 09.15.21 BCVA OD: 20/20 OS: 20/20 Ocular Hx- NPDR with edema  PMH- DM, HTN   CURRENT MEDICATIONS: No current outpatient medications on file. (Ophthalmic Drugs)   No current facility-administered medications for this visit. (Ophthalmic Drugs)   Current Outpatient Medications (Other)  Medication Sig  . amLODipine (NORVASC) 10 MG tablet Take 1 tablet (10 mg total) by mouth in the morning. (Patient taking differently: Take 10 mg by mouth daily. )  . atorvastatin (LIPITOR) 40 MG tablet Take 1 tablet (40 mg total) by mouth daily. (Patient taking differently: Take 40 mg by mouth at bedtime. )  . [employer] ASPIRIN ADULT LOW DOSE 81 MG chewable tablet CHEW 1 TABLET (81 MG TOTAL) BY MOUTH DAILY. (Patient taking differently: Chew 81 mg by mouth at bedtime. )  . Cyanocobalamin (VITAMIN B-12 PO) Take 1 tablet by mouth daily.  . hydrochlorothiazide (HYDRODIURIL) 25 MG  tablet TAKE 1 TABLET (25 MG TOTAL) BY MOUTH IN THE MORNING. (Patient taking differently: Take 25 mg by mouth at bedtime. )  . losartan (COZAAR) 25 MG tablet Take 1 tablet (25 mg total) by mouth every evening. (Patient taking differently: Take 25 mg by mouth at bedtime. )  . metFORMIN (GLUCOPHAGE) 1000 MG tablet Take 1 tablet (1,000 mg total) by mouth 2 (two) times daily with a meal.  . methocarbamol (ROBAXIN) 500 MG tablet Take 1 tablet (500 mg total) by mouth every 8 (eight) hours as needed for muscle spasms. (Patient taking differently: Take 500 mg by mouth daily with supper. )  . metoprolol succinate (TOPROL-XL) 25 MG 24 hr tablet Take 1 tablet (25 mg total) by mouth daily. Take with or immediately following a meal. Hold if systolic blood pressure (top blood pressure number) less than 100 mmHg or heart rate less than 60 bpm (pulse). (Patient taking differently: Take 25 mg by mouth at bedtime. Take with or immediately following a meal. Hold if systolic blood pressure (top blood pressure number) less than 100 mmHg or heart rate less than 60 bpm (pulse).)  . Na Sulfate-K Sulfate-Mg Sulf (SUPREP BOWEL PREP KIT) 17.5-3.13-1.6 GM/177ML SOLN Take 1 kit by mouth as directed. For colonoscopy prep  . Sodium Sulfate-Mag Sulfate-KCl (SUTAB) 432 094 3303 MG TABS Take 1 kit by mouth as directed. (Patient not taking: Reported  on 11/22/2019)  . VITAMIN E PO Take 1 tablet by mouth daily.   No current facility-administered medications for this visit. (Other)      REVIEW OF SYSTEMS: ROS    Positive for: Musculoskeletal, Endocrine, Eyes   Negative for: Constitutional, Gastrointestinal, Neurological, Skin, Genitourinary, HENT, Cardiovascular, Respiratory, Psychiatric, Allergic/Imm, Heme/Lymph   Last edited by Theodore Demark, COA on 11/28/2019  8:50 AM. (History)       ALLERGIES No Known Allergies  PAST MEDICAL HISTORY Past Medical History:  Diagnosis Date  . Arthritis   . Diabetes mellitus without  complication ([place])   . Hyperlipidemia   . Hypertension   . Obesity    Past Surgical History:  Procedure Laterality Date  . EYE SURGERY Left    had to have eye flushed after getting costic sodium in L eye  . HERNIA REPAIR    . HERNIA REPAIR     35 years ago  . LEFT HEART CATH AND CORONARY ANGIOGRAPHY N/A 04/11/2019   Procedure: LEFT HEART CATH AND CORONARY ANGIOGRAPHY;  Surgeon: Adrian Prows, MD;  Location: [facility];  Service: Cardiovascular;  Laterality: N/A;  . RENAL ANGIOGRAPHY Bilateral 04/11/2019   Procedure: RENAL ANGIOGRAPHY;  Surgeon: Adrian Prows, MD;  Location: [facility];  Service: Cardiovascular;  Laterality: Bilateral;  . TOTAL KNEE ARTHROPLASTY Left 08/01/2019   Procedure: LEFT TOTAL KNEE ARTHROPLASTY;  Surgeon: Meredith Pel, MD;  Location: [facility];  Service: Orthopedics;  Laterality: Left;    FAMILY HISTORY Family History  Problem Relation Age of Onset  . Diabetes Mother   . COPD Mother   . Cancer Mother        unknown type   . Heart disease Father   . COPD Sister   . Healthy Brother   . Heart disease Brother   . Healthy Son   . Colon cancer Neg Hx   . Colon polyps Neg Hx   . Esophageal cancer Neg Hx   . Rectal cancer Neg Hx   . Stomach cancer Neg Hx   . Inflammatory bowel disease Neg Hx   . Liver disease Neg Hx   . Pancreatic cancer Neg Hx     SOCIAL HISTORY Social History   Tobacco Use  . Smoking status: Never Smoker  . Smokeless tobacco: Never Used  Vaping Use  . Vaping Use: Never used  Substance Use Topics  . Alcohol use: Not Currently  . Drug use: Never         OPHTHALMIC EXAM:  Base Eye Exam    Visual Acuity (Snellen - Linear)      Right Left   Dist [place] 20/25 20/25 -2   Dist ph [place] NI NI       Tonometry (Tonopen, 8:46 AM)      Right Left   Pressure 16 14       Pupils      Dark Light Shape React APD   Right 3 2 Round Brisk None   Left 3 2 Round Brisk None       Visual Fields (Counting fingers)      Left  Right    Full Full       Extraocular Movement      Right Left    Full Full       Neuro/Psych    Oriented x3: Yes   Mood/Affect: Normal       Dilation    Both eyes: 1.0% Mydriacyl, 2.5% Phenylephrine @ 8:46 AM  Slit Lamp and Fundus Exam    Slit Lamp Exam      Right Left   Lids/Lashes Dermatochalasis - upper lid Dermatochalasis - upper lid   Conjunctiva/Sclera White and quiet White and quiet   Cornea trace Punctate epithelial erosions 1-2+ inferior Punctate epithelial erosions   Anterior Chamber Deep and quiet Deep and quiet   Iris Round and moderately dilated to 6.48m, No NVI Round and moderately dilated to 6.073m No NVI   Lens 2+ Nuclear sclerosis, 2+ Cortical cataract, +Vacuoles 2+ Nuclear sclerosis, 2+ Cortical cataract, +Vacuoles   Vitreous Vitreous syneresis Vitreous syneresis       Fundus Exam      Right Left   Disc Pink and Sharp, +Peripapillary atrophy Pink and Sharp, +Peripapillary atrophy, Compact   C/D Ratio 0.2 0.1   Macula Flat, Blunted foveal reflex, scattered Microaneurysms greatest SN macula with focal cystic changes / edema -- slightly increased Flat, Blunted foveal reflex, central cystic changes, scattered MA   Vessels attenuated, mild tortuousity attenuated, mild tortuousity   Periphery Attached, pigmented cystoid degeneration ST periphery, rare scattered MA/IRH ST periphery, focal pigmented VR tuft at 0900 Attached, scattered MA / IRH greatest posteriorly, focal pigmented CR scarring along distal IT arcade (0130)             IMAGING AND PROCEDURES  Imaging and Procedures for 11/28/2019  OCT, Retina - OU - Both Eyes       Right Eye Quality was good. Central Foveal Thickness: 360. Progression has worsened. Findings include normal foveal contour, intraretinal fluid, no SRF (Interval increase in IRF / edema SN macula and at fovea).   Left Eye Quality was good. Central Foveal Thickness: 356. Progression has been stable. Findings include  abnormal foveal contour, intraretinal fluid, no SRF, vitreomacular adhesion  (Persistent central cystic changes).   Notes *Images captured and stored on drive  Diagnosis / Impression:  DME OU OD: Interval increase in IRF / edema SN macula OS: Persistent central cystic changes  Clinical management:  See below  Abbreviations: NFP - Normal foveal profile. CME - cystoid macular edema. PED - pigment epithelial detachment. IRF - intraretinal fluid. SRF - subretinal fluid. EZ - ellipsoid zone. ERM - epiretinal membrane. ORA - outer retinal atrophy. ORT - outer retinal tubulation. SRHM - subretinal hyper-reflective material. IRHM - intraretinal hyper-reflective material        Intravitreal Injection, Pharmacologic Agent - OD - Right Eye       Time Out 11/28/2019. 9:14 AM. Confirmed correct patient, procedure, site, and patient consented.   Anesthesia Topical anesthesia was used. Anesthetic medications included Lidocaine 2%, Proparacaine 0.5%.   Procedure Preparation included 5% betadine to ocular surface, eyelid speculum. A (32g) needle was used.   Injection:  1.25 mg Bevacizumab (AVASTIN) SOLN   NDC: 5056387-564-33Lot: : [id number] date: 12/10/2019   Route: Intravitreal, Site: Right Eye, Waste: 0.05 mL  Post-op Post injection exam found visual acuity of at least counting fingers. The patient tolerated the procedure well. There were no complications. The patient received written and verbal post procedure care education.                 ASSESSMENT/PLAN:    ICD-10-CM   1. 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 Retinal edema  H35.81 OCT, Retina - OU - Both  Eyes  3. Essential hypertension  I10   4. Hypertensive retinopathy of both eyes  H35.033   5. Combined forms of  age-related cataract of both eyes  H25.813     1,2. Moderate Non-proliferative diabetic retinopathy, OU  - A1c 7.4% on 6.4.21 - BCVA 20/25 OU - exam shows scattered MA and mild edema/cystic changes - FA (09.29.21) shows late leaking MA OU; no NV OU  - OCT OD: Interval increase in IRF / edema SN macula; OS: Persistent central cystic changes  - recommend IVA OD #1 today, 10.26.21 for worsening DME OD  - pt wishes to proceed  - RBA of procedure discussed, questions answered  - informed consent obtained and signed  - see procedure note - f/u in 4 wks -- DFE/OCT, possible injection  3,4. Hypertensive retinopathy OU - discussed importance of tight BP control - monitor  5. Mixed Cataract OU - The symptoms of cataract, surgical options, and treatments and risks were discussed with patient. - discussed diagnosis and progression - not yet visually significant - monitor for now   Ophthalmic Meds Ordered this visit:  Meds ordered this encounter  Medications  . Bevacizumab (AVASTIN) SOLN 1.25 mg       Return in about 4 weeks (around 12/26/2019) for DME -- Dilated Exam, OCT, Possible Injxn.  There are no Patient Instructions on file for this visit.   Explained the diagnoses, plan, and follow up with the patient and they expressed understanding.  Patient expressed understanding of the importance of proper follow up care.   This document serves as a record of services personally performed by Gardiner Sleeper, MD, PhD. It was created on their behalf by Estill Bakes, COT an ophthalmic technician. The creation of this record is the provider's dictation and/or activities during the visit.    Electronically signed by: Estill Bakes, COT 10.20.21 @ 11:51 AM   This document serves as a record of services personally performed by Gardiner Sleeper, MD, PhD. It was created on their behalf by San Jetty. Owens Shark, OA an ophthalmic technician. The creation of this record is the provider's dictation and/or  activities during the visit.    Electronically signed by: San Jetty. Owens Shark, [place] 10.26.2021 11:51 AM  Gardiner Sleeper, M.D., Ph.D. Diseases & Surgery of the Retina and [place] 11/28/2019   I have reviewed the above documentation for accuracy and completeness, and I agree with the above. Gardiner Sleeper, M.D., Ph.D. 11/28/19 11:51 AM  Abbreviations: M myopia (nearsighted); A astigmatism; H hyperopia (farsighted); P presbyopia; Mrx spectacle prescription;  CTL contact lenses; OD right eye; OS left eye; OU both eyes  XT exotropia; ET esotropia; PEK punctate epithelial keratitis; PEE punctate epithelial erosions; DES dry eye syndrome; MGD meibomian gland dysfunction; ATs artificial tears; PFAT's preservative free artificial tears; [place] nuclear sclerotic cataract; PSC posterior subcapsular cataract; ERM epi-retinal membrane; PVD posterior vitreous detachment; RD retinal detachment; DM diabetes mellitus; DR diabetic retinopathy; NPDR non-proliferative diabetic retinopathy; PDR proliferative diabetic retinopathy; CSME clinically significant macular edema; DME diabetic macular edema; dbh dot blot hemorrhages; CWS cotton wool spot; POAG primary open angle glaucoma; C/D cup-to-disc ratio; HVF humphrey visual field; GVF goldmann visual field; OCT optical coherence tomography; IOP intraocular pressure; BRVO Branch retinal vein occlusion; CRVO central retinal vein occlusion; CRAO central retinal artery occlusion; BRAO branch retinal artery occlusion; RT retinal tear; SB scleral buckle; PPV pars plana vitrectomy; VH Vitreous hemorrhage; PRP panretinal laser photocoagulation; IVK intravitreal kenalog; VMT vitreomacular  traction; MH Macular hole;  NVD neovascularization of the disc; NVE neovascularization elsewhere; AREDS age related eye disease study; ARMD age related macular degeneration; POAG primary open angle glaucoma; EBMD epithelial/anterior basement membrane dystrophy; ACIOL  anterior chamber intraocular lens; IOL intraocular lens; PCIOL posterior chamber intraocular lens; Phaco/IOL phacoemulsification with intraocular lens placement; [place] photorefractive keratectomy; LASIK laser assisted in situ keratomileusis; HTN hypertension; DM diabetes mellitus; COPD chronic obstructive pulmonary disease

## 2019-11-25 ENCOUNTER — Other Ambulatory Visit (HOSPITAL_COMMUNITY)
Admission: RE | Admit: 2019-11-25 | Discharge: 2019-11-25 | Disposition: A | Discharge status: Home / Self Care | Payer: 59 | Source: Ambulatory Visit | Attending: Gastroenterology | Admitting: Gastroenterology

## 2019-11-25 DIAGNOSIS — Z01812 Encounter for preprocedural laboratory examination: Secondary | ICD-10-CM | POA: Diagnosis not present

## 2019-11-25 DIAGNOSIS — Z20822 Contact with and (suspected) exposure to covid-19: Secondary | ICD-10-CM | POA: Insufficient documentation

## 2019-11-25 LAB — SARS CORONAVIRUS 2 (TAT 6-24 HRS): SARS Coronavirus 2: NEGATIVE

## 2019-11-28 ENCOUNTER — Ambulatory Visit (INDEPENDENT_AMBULATORY_CARE_PROVIDER_SITE_OTHER): Payer: 59 | Admitting: Ophthalmology

## 2019-11-28 ENCOUNTER — Other Ambulatory Visit: Payer: Self-pay

## 2019-11-28 ENCOUNTER — Encounter (INDEPENDENT_AMBULATORY_CARE_PROVIDER_SITE_OTHER): Payer: Self-pay | Admitting: Ophthalmology

## 2019-11-28 DIAGNOSIS — H3581 Retinal edema: Secondary | ICD-10-CM | POA: Diagnosis not present

## 2019-11-28 DIAGNOSIS — I10 Essential (primary) hypertension: Secondary | ICD-10-CM

## 2019-11-28 DIAGNOSIS — H25813 Combined forms of age-related cataract, bilateral: Secondary | ICD-10-CM

## 2019-11-28 DIAGNOSIS — H35033 Hypertensive retinopathy, bilateral: Secondary | ICD-10-CM | POA: Diagnosis not present

## 2019-11-28 DIAGNOSIS — E113313 Type 2 diabetes mellitus with moderate nonproliferative diabetic retinopathy with macular edema, bilateral: Secondary | ICD-10-CM

## 2019-11-28 MED ORDER — BEVACIZUMAB CHEMO INJECTION 1.25MG/0.05ML SYRINGE FOR KALEIDOSCOPE
1.2500 mg | INTRAVITREAL | Status: AC | PRN
  Administered 2019-11-28: 1.25 mg via INTRAVITREAL

## 2019-11-28 NOTE — Progress Notes (Signed)
Pre op call done for endo procedure tomorrow 11/29/19. Patient states he has been quarantined since covid test, has clear instructions on colon prep, and has a ride arranged to take him home post procedure. All questions addressed.

## 2019-11-29 ENCOUNTER — Ambulatory Visit (HOSPITAL_COMMUNITY): Admit: 2019-11-29 | Payer: 59 | Admitting: Gastroenterology

## 2019-11-29 ENCOUNTER — Encounter (HOSPITAL_COMMUNITY): Payer: Self-pay | Admitting: Gastroenterology

## 2019-11-29 ENCOUNTER — Encounter (HOSPITAL_COMMUNITY): Payer: Self-pay

## 2019-11-29 ENCOUNTER — Encounter (HOSPITAL_COMMUNITY)
Admission: RE | Disposition: A | Discharge status: Home / Self Care | Payer: Self-pay | Source: Home / Self Care | Attending: Gastroenterology

## 2019-11-29 ENCOUNTER — Ambulatory Visit (HOSPITAL_COMMUNITY): Payer: 59 | Admitting: Certified Registered Nurse Anesthetist

## 2019-11-29 ENCOUNTER — Ambulatory Visit (HOSPITAL_COMMUNITY)
Admission: RE | Admit: 2019-11-29 | Discharge: 2019-11-29 | Disposition: A | Discharge status: Home / Self Care | Payer: 59 | Attending: Gastroenterology | Admitting: Gastroenterology

## 2019-11-29 DIAGNOSIS — D509 Iron deficiency anemia, unspecified: Secondary | ICD-10-CM | POA: Insufficient documentation

## 2019-11-29 DIAGNOSIS — K2970 Gastritis, unspecified, without bleeding: Secondary | ICD-10-CM | POA: Diagnosis not present

## 2019-11-29 DIAGNOSIS — D120 Benign neoplasm of cecum: Secondary | ICD-10-CM | POA: Diagnosis not present

## 2019-11-29 DIAGNOSIS — K635 Polyp of colon: Secondary | ICD-10-CM

## 2019-11-29 DIAGNOSIS — K641 Second degree hemorrhoids: Secondary | ICD-10-CM | POA: Diagnosis not present

## 2019-11-29 DIAGNOSIS — K3189 Other diseases of stomach and duodenum: Secondary | ICD-10-CM | POA: Diagnosis not present

## 2019-11-29 DIAGNOSIS — Q438 Other specified congenital malformations of intestine: Secondary | ICD-10-CM | POA: Diagnosis not present

## 2019-11-29 DIAGNOSIS — K644 Residual hemorrhoidal skin tags: Secondary | ICD-10-CM | POA: Insufficient documentation

## 2019-11-29 DIAGNOSIS — K259 Gastric ulcer, unspecified as acute or chronic, without hemorrhage or perforation: Secondary | ICD-10-CM | POA: Insufficient documentation

## 2019-11-29 DIAGNOSIS — Z86010 Personal history of colonic polyps: Secondary | ICD-10-CM

## 2019-11-29 DIAGNOSIS — K209 Esophagitis, unspecified without bleeding: Secondary | ICD-10-CM | POA: Diagnosis not present

## 2019-11-29 DIAGNOSIS — D490 Neoplasm of unspecified behavior of digestive system: Secondary | ICD-10-CM | POA: Diagnosis not present

## 2019-11-29 DIAGNOSIS — D123 Benign neoplasm of transverse colon: Secondary | ICD-10-CM | POA: Insufficient documentation

## 2019-11-29 HISTORY — PX: HEMOSTASIS CLIP PLACEMENT: SHX6857

## 2019-11-29 HISTORY — PX: SUBMUCOSAL LIFTING INJECTION: SHX6855

## 2019-11-29 HISTORY — PX: ENDOSCOPIC MUCOSAL RESECTION: SHX6839

## 2019-11-29 HISTORY — PX: POLYPECTOMY: SHX5525

## 2019-11-29 HISTORY — PX: BIOPSY: SHX5522

## 2019-11-29 HISTORY — PX: ESOPHAGOGASTRODUODENOSCOPY (EGD) WITH PROPOFOL: SHX5813

## 2019-11-29 HISTORY — PX: COLONOSCOPY WITH PROPOFOL: SHX5780

## 2019-11-29 HISTORY — PX: HOT HEMOSTASIS: SHX5433

## 2019-11-29 LAB — GLUCOSE, CAPILLARY: Glucose-Capillary: 159 mg/dL — ABNORMAL HIGH (ref 70–99)

## 2019-11-29 SURGERY — EGD (ESOPHAGOGASTRODUODENOSCOPY)
Anesthesia: Monitor Anesthesia Care

## 2019-11-29 SURGERY — COLONOSCOPY WITH PROPOFOL
Anesthesia: Monitor Anesthesia Care

## 2019-11-29 MED ORDER — LACTATED RINGERS IV SOLN
INTRAVENOUS | Status: DC
  Administered 2019-11-29: 1000 mL via INTRAVENOUS

## 2019-11-29 MED ORDER — SODIUM CHLORIDE 0.9 % IV SOLN: INTRAVENOUS | Status: DC

## 2019-11-29 MED ORDER — MIDAZOLAM HCL 2 MG/2ML IJ SOLN
INTRAMUSCULAR | Status: AC
  Filled 2019-11-29: qty 2

## 2019-11-29 MED ORDER — MIDAZOLAM HCL 2 MG/2ML IJ SOLN
INTRAMUSCULAR | Status: DC | PRN
  Administered 2019-11-29: 2 mg via INTRAVENOUS

## 2019-11-29 MED ORDER — PROPOFOL 10 MG/ML IV BOLUS
INTRAVENOUS | Status: DC | PRN
  Administered 2019-11-29: 20 mg via INTRAVENOUS
  Administered 2019-11-29: 40 mg via INTRAVENOUS
  Administered 2019-11-29 (×2): 20 mg via INTRAVENOUS

## 2019-11-29 MED ORDER — OMEPRAZOLE 40 MG PO CPDR: 40.0000 mg | DELAYED_RELEASE_CAPSULE | Freq: Two times a day (BID) | ORAL | 4 refills | Status: DC

## 2019-11-29 MED ORDER — ONDANSETRON HCL 4 MG/2ML IJ SOLN
INTRAMUSCULAR | Status: DC | PRN
  Administered 2019-11-29: 4 mg via INTRAVENOUS

## 2019-11-29 MED ORDER — CIPROFLOXACIN IN D5W 400 MG/200ML IV SOLN
INTRAVENOUS | Status: DC | PRN
  Administered 2019-11-29: 400 mg via INTRAVENOUS

## 2019-11-29 MED ORDER — PROPOFOL 10 MG/ML IV BOLUS
INTRAVENOUS | Status: AC
  Filled 2019-11-29: qty 20

## 2019-11-29 MED ORDER — CIPROFLOXACIN HCL 500 MG PO TABS: 500.0000 mg | ORAL_TABLET | Freq: Two times a day (BID) | ORAL | 0 refills | Status: AC

## 2019-11-29 MED ORDER — CIPROFLOXACIN IN D5W 400 MG/200ML IV SOLN
INTRAVENOUS | Status: AC
  Filled 2019-11-29: qty 200

## 2019-11-29 MED ORDER — PHENYLEPHRINE 40 MCG/ML (10ML) SYRINGE FOR IV PUSH (FOR BLOOD PRESSURE SUPPORT)
PREFILLED_SYRINGE | INTRAVENOUS | Status: DC | PRN
  Administered 2019-11-29: 80 ug via INTRAVENOUS
  Administered 2019-11-29: 120 ug via INTRAVENOUS
  Administered 2019-11-29 (×7): 80 ug via INTRAVENOUS

## 2019-11-29 MED ORDER — PROPOFOL 500 MG/50ML IV EMUL
INTRAVENOUS | Status: DC | PRN
  Administered 2019-11-29: 125 ug/kg/min via INTRAVENOUS

## 2019-11-29 MED ORDER — LIDOCAINE HCL (CARDIAC) PF 100 MG/5ML IV SOSY
PREFILLED_SYRINGE | INTRAVENOUS | Status: DC | PRN
  Administered 2019-11-29: 40 mg via INTRAVENOUS

## 2019-11-29 MED ORDER — PROPOFOL 1000 MG/100ML IV EMUL
INTRAVENOUS | Status: AC
  Filled 2019-11-29: qty 100

## 2019-11-29 SURGICAL SUPPLY — 25 items

## 2019-11-29 NOTE — Discharge Instructions (Signed)
YOU HAD AN ENDOSCOPIC PROCEDURE TODAY: Refer to the procedure report and other information in the discharge instructions given to you for any specific questions about what was found during the examination. If this information does not answer your questions, please call [facility] office at [phone] to clarify.  ° °YOU SHOULD EXPECT: Some feelings of bloating in the abdomen. Passage of more gas than usual. Walking can help get rid of the air that was put into your GI tract during the procedure and reduce the bloating. If you had a lower endoscopy (such as a colonoscopy or flexible sigmoidoscopy) you may notice spotting of blood in your stool or on the toilet paper. Some abdominal soreness may be present for a day or two, also. ° °DIET: Your first meal following the procedure should be a light meal and then it is ok to progress to your normal diet. A half-sandwich or bowl of soup is an example of a good first meal. Heavy or fried foods are harder to digest and may make you feel nauseous or bloated. Drink plenty of fluids but you should avoid alcoholic beverages for 24 hours. If you had a esophageal dilation, please see attached instructions for diet.   ° °ACTIVITY: Your care partner should take you home directly after the procedure. You should plan to take it easy, moving slowly for the rest of the day. You can resume normal activity the day after the procedure however YOU SHOULD NOT DRIVE, use power tools, machinery or perform tasks that involve climbing or major physical exertion for 24 hours (because of the sedation medicines used during the test).  ° °SYMPTOMS TO REPORT IMMEDIATELY: °A gastroenterologist can be reached at any hour. Please call [phone]  for any of the following symptoms:  °Following lower endoscopy (colonoscopy, flexible sigmoidoscopy) °Excessive amounts of blood in the stool  °Significant tenderness, worsening of abdominal pains  °Swelling of the abdomen that is new, acute  °Fever of 100° or  higher  °Following upper endoscopy (EGD, EUS, ERCP, esophageal dilation) °Vomiting of blood or coffee ground material  °New, significant abdominal pain  °New, significant chest pain or pain under the shoulder blades  °Painful or persistently difficult swallowing  °New shortness of breath  °Black, tarry-looking or red, bloody stools ° °FOLLOW UP:  °If any biopsies were taken you will be contacted by phone or by letter within the next 1-3 weeks. Call [phone]  if you have not heard about the biopsies in 3 weeks.  °Please also call with any specific questions about appointments or follow up tests. ° °

## 2019-11-29 NOTE — Transfer of Care (Signed)
Immediate Anesthesia Transfer of Care Note  Patient: Joe Stewart  Procedure(s) Performed: COLONOSCOPY WITH PROPOFOL (N/A ) ENDOSCOPIC MUCOSAL RESECTION (N/A ) ESOPHAGOGASTRODUODENOSCOPY (EGD) WITH PROPOFOL (N/A ) BIOPSY SUBMUCOSAL LIFTING INJECTION HOT HEMOSTASIS (ARGON PLASMA COAGULATION/BICAP) (N/A ) POLYPECTOMY  Patient Location: Endoscopy Unit  Anesthesia Type:MAC  Level of Consciousness: awake, alert , oriented, drowsy and patient cooperative  Airway & Oxygen Therapy: Patient Spontanous Breathing and Patient connected to face mask oxygen  Post-op Assessment: Report given to RN and Post -op Vital signs reviewed and stable  Post vital signs: Reviewed and stable  Last Vitals:  Vitals Value Taken Time  BP 125/69 0920  Temp    Pulse 68 0920  Resp    SpO2 100 0920    Last Pain:  Vitals:   11/29/19 0628  TempSrc: Oral  PainSc: 0-No pain         Complications: No complications documented.

## 2019-11-29 NOTE — H&P (Signed)
GASTROENTEROLOGY PROCEDURE H&P NOTE   Primary Care Physician: [name], NP  HPI: Joe Stewart is a 64 y.o. male who presents for EGD for Iron deficiency evaluation and Colonoscopy for EMR attempt Cecal polyp.  Past Medical History:  Diagnosis Date  . Arthritis   . Diabetes mellitus without complication ([place])   . Hyperlipidemia   . Hypertension   . Obesity    Past Surgical History:  Procedure Laterality Date  . EYE SURGERY Left    had to have eye flushed after getting costic sodium in L eye  . HERNIA REPAIR    . HERNIA REPAIR     35 years ago  . LEFT HEART CATH AND CORONARY ANGIOGRAPHY N/A 04/11/2019   Procedure: LEFT HEART CATH AND CORONARY ANGIOGRAPHY;  Surgeon: Adrian Prows, MD;  Location: [facility];  Service: Cardiovascular;  Laterality: N/A;  . RENAL ANGIOGRAPHY Bilateral 04/11/2019   Procedure: RENAL ANGIOGRAPHY;  Surgeon: Adrian Prows, MD;  Location: [facility];  Service: Cardiovascular;  Laterality: Bilateral;  . TOTAL KNEE ARTHROPLASTY Left 08/01/2019   Procedure: LEFT TOTAL KNEE ARTHROPLASTY;  Surgeon: Meredith Pel, MD;  Location: [facility];  Service: Orthopedics;  Laterality: Left;   Current Facility-Administered Medications  Medication Dose Route Frequency Provider Last Rate Last Admin  . 0.9 %  sodium chloride infusion   Intravenous Continuous Mansouraty, Telford Nab., MD      . lactated ringers infusion   Intravenous Continuous Mansouraty, Telford Nab., MD 10 mL/hr at 11/29/19 0647 1,000 mL at 11/29/19 0647   No Known Allergies Family History  Problem Relation Age of Onset  . Diabetes Mother   . COPD Mother   . Cancer Mother        unknown type   . Heart disease Father   . COPD Sister   . Healthy Brother   . Heart disease Brother   . Healthy Son   . Colon cancer Neg Hx   . Colon polyps Neg Hx   . Esophageal cancer Neg Hx   . Rectal cancer Neg Hx   . Stomach cancer Neg Hx   . Inflammatory bowel disease Neg Hx   . Liver disease Neg  Hx   . Pancreatic cancer Neg Hx    Social History   Socioeconomic History  . Marital status: Single    Spouse name: Not on file  . Number of children: 1  . Years of education: Not on file  . Highest education level: Not on file  Occupational History  . Not on file  Tobacco Use  . Smoking status: Never Smoker  . Smokeless tobacco: Never Used  Vaping Use  . Vaping Use: Never used  Substance and Sexual Activity  . Alcohol use: Not Currently  . Drug use: Never  . Sexual activity: Not Currently  Other Topics Concern  . Not on file  Social History Narrative  . Not on file   Social Determinants of Health   Financial Resource Strain:   . Difficulty of Paying Living Expenses: Not on file  Food Insecurity:   . Worried About Charity fundraiser in the Last Year: Not on file  . Ran Out of Food in the Last Year: Not on file  Transportation Needs:   . Lack of Transportation (Medical): Not on file  . Lack of Transportation (Non-Medical): Not on file  Physical Activity:   . Days of Exercise per Week: Not on file  . Minutes of Exercise per Session:  Not on file  Stress:   . Feeling of Stress : Not on file  Social Connections:   . Frequency of Communication with Friends and Family: Not on file  . Frequency of Social Gatherings with Friends and Family: Not on file  . Attends Religious Services: Not on file  . Active Member of Clubs or Organizations: Not on file  . Attends Archivist Meetings: Not on file  . Marital Status: Not on file  Intimate Partner Violence:   . Fear of Current or Ex-Partner: Not on file  . Emotionally Abused: Not on file  . Physically Abused: Not on file  . Sexually Abused: Not on file    Physical Exam: Vital signs in last 24 hours: Temp:  [97.9 F (36.6 C)] 97.9 F (36.6 C) (10/27 0628) Pulse Rate:  [89] 89 (10/27 0628) Resp:  [22] 22 (10/27 0628) BP: (175-197)/(79-89) 175/89 (10/27 0631) SpO2:  [100 %] 100 % (10/27 0628) Weight:  [859.2  kg] 115.2 kg (10/27 0628)   GEN: NAD EYE: Sclerae anicteric ENT: MMM CV: Non-tachycardic GI: Soft, NT/ND NEURO:  Alert & Oriented x 3  Lab Results: No results for input(s): WBC, HGB, HCT, PLT in the last 72 hours. BMET No results for input(s): NA, K, CL, CO2, GLUCOSE, BUN, CREATININE, CALCIUM in the last 72 hours. LFT No results for input(s): PROT, ALBUMIN, AST, ALT, ALKPHOS, BILITOT, BILIDIR, IBILI in the last 72 hours. PT/INR No results for input(s): LABPROT, INR in the last 72 hours.   Impression / Plan: This is a 64 y.o.male who presents for EGD for Iron deficiency evaluation and Colonoscopy for EMR attempt Cecal polyp.  The risks and benefits of endoscopic evaluation were discussed with the patient; these include but are not limited to the risk of perforation, infection, bleeding, missed lesions, lack of diagnosis, severe illness requiring hospitalization, as well as anesthesia and sedation related illnesses.  The patient is agreeable to proceed.    Justice Britain, MD [facility] Office # [phone]

## 2019-11-29 NOTE — Op Note (Signed)
[facility] Patient Name: Joe Stewart Procedure Date: 11/29/2019 MRN: [id number] Attending MD: Justice Britain , MD Date of Birth: April 23, 1955 CSN: [id number] Age: 64 Admit Type: Outpatient Procedure:                Upper GI endoscopy Indications:              Iron deficiency Providers:                Justice Britain, MD, Baird Cancer, RN, Ladona Ridgel, Technician Referring MD:             [name] Medicines:                Monitored Anesthesia Care Complications:            No immediate complications. Estimated Blood Loss:     Estimated blood loss was minimal. Procedure:                Pre-Anesthesia Assessment:                           - Prior to the procedure, a History and Physical                            was performed, and patient medications and                            allergies were reviewed. The patient's tolerance of                            previous anesthesia was also reviewed. The risks                            and benefits of the procedure and the sedation                            options and risks were discussed with the patient.                            All questions were answered, and informed consent                            was obtained. Prior Anticoagulants: The patient has                            taken no previous anticoagulant or antiplatelet                            agents except for aspirin. ASA Grade Assessment:                            III - A patient with severe systemic disease. After  reviewing the risks and benefits, the patient was                            deemed in satisfactory condition to undergo the                            procedure.                           After obtaining informed consent, the endoscope was                            passed under direct vision. Throughout the                            procedure, the patient's blood  pressure, pulse, and                            oxygen saturations were monitored continuously. The                            [id number]-H190 ([id number] Olympus gastroscope was                            introduced through the mouth, and advanced to the                            second part of duodenum. The upper GI endoscopy was                            accomplished without difficulty. The patient                            tolerated the procedure. Scope In: Scope Out: Findings:      No gross lesions were noted in the proximal esophagus and in the mid       esophagus.      LA Grade C (one or more mucosal breaks continuous between tops of 2 or       more mucosal folds, less than 75% circumference) esophagitis with no       bleeding was found in the distal esophagus.      The Z-line was regular and was found 40 cm from the incisors.      Hematin (altered blood/coffee-ground-like material) was found in the       entire examined stomach. Lavage of the area was performed using a       moderate amount, resulting in clearance with adequate visualization       without finding of persistent active oozing.      Scattered moderate inflammation with evidence of recent hemorrhage       characterized by erosions, erythema and granularity was found in the       entire examined stomach.      One non-bleeding superficial gastric ulcer with a clean ulcer base       (Forrest Class III) was found on the greater curvature of the stomach.       The lesion was  6 mm in largest dimension.      No other gross lesions were noted in the entire examined stomach.       Biopsies were taken with a cold forceps for histology and Helicobacter       pylori testing.      No gross lesions were noted in the duodenal bulb, in the first portion       of the duodenum and in the second portion of the duodenum. Biopsies for       histology were taken with a cold forceps for evaluation of celiac       disease. Impression:                - No gross lesions in esophagus proximally. LA                            Grade C esophagitis with no bleeding distally.                            Z-line regular, 40 cm from the incisors.                           - Hematin (altered blood/coffee-ground-like                            material) in the entire stomach - lavaged with                            clearance. Gastritis with recent hemorrhage.                            Non-bleeding gastric ulcer with a clean ulcer base                            (Forrest Class III). Biopsied for HP.                           - No gross lesions in the duodenal bulb, in the                            first portion of the duodenum and in the second                            portion of the duodenum. Biopsied for Celiac. Moderate Sedation:      Not Applicable - Patient had care per Anesthesia. Recommendation:           - Proceed to scheduled colonoscopy.                           - Await pathology results.                           - Observe patient's clinical course.                           - Initiate Omeprazole 40 mg twice daily and  maintain.                           - Repeat EGD in 3-4 months to ensure healing of                            esophagitis to rule out underlying Barrett's and to                            evaluate healing of gastric ulcer.                           - The findings and recommendations were discussed                            with the patient.                           - The findings and recommendations were discussed                            with the patient's family. Procedure Code(s):        --- Professional ---                           [id number], Esophagogastroduodenoscopy, flexible,                            transoral; with biopsy, single or multiple Diagnosis Code(s):        --- Professional ---                           K20.90, Esophagitis, unspecified without bleeding                            K92.2, Gastrointestinal hemorrhage, unspecified                           K29.71, Gastritis, unspecified, with bleeding                           K25.9, Gastric ulcer, unspecified as acute or                            chronic, without hemorrhage or perforation                           D50.9, Iron deficiency anemia, unspecified CPT copyright 2019 American Medical Association. All rights reserved. The codes documented in this report are preliminary and upon coder review may  be revised to meet current compliance requirements. Justice Britain, MD 11/29/2019 9:21:28 AM Number of Addenda: 0

## 2019-11-29 NOTE — Anesthesia Preprocedure Evaluation (Signed)
Anesthesia Evaluation  Patient identified by MRN, date of birth, ID band Patient awake    Reviewed: Allergy & Precautions, NPO status , Patient's Chart, lab work & pertinent test results, reviewed documented beta blocker date and time   Airway Mallampati: III  TM Distance: >3 FB Neck ROM: Full    Dental  (+) Missing, Dental Advisory Given,    Pulmonary neg pulmonary ROS,    Pulmonary exam normal breath sounds clear to auscultation       Cardiovascular hypertension, Pt. on home beta blockers and Pt. on medications negative cardio ROS Normal cardiovascular exam Rhythm:Regular Rate:Normal  HLD  EKG 04/21/19: Sinus Rhythm, ventricular rate 77 bpm. RBBB. ST-T changes secondary to RBBB.    CV: Left heart catheterization selective renal arteriogram 04/11/2019(Ganji, Ulice Dash, MD):  The left ventricular systolic function is normal.  LV end diastolic pressure is normal.  Moderate diffuse coronary artery disease with calcification. Right coronary artery with arteriomegaly and ectasia and slow filling. Left main: Normal. LAD arteriomegaly, ectasia noted, slow filling throughout the LAD. Diffuse disease. Circumflex: Large vessel, arteriomegaly, diffuse ectasia noted. Slow filling.  Selective right and left renal arteriogram: Normal renal arteries. No evidence of renal artery stenosis.  Impression: Abnormal nuclear stress test related to severe diffuse disease and ectasia of the coronary vessels, markedly reduced blood flow especially in the right coronary artery but also in the circumflex and LAD. This is related to endothelial dysfunction and microvascular disease along with ectasia contributing to slow flow. Findings suggestive of hypertensive heart disease.  Recommendation: Patient needs aggressive risk factor modification. If he persist to have any symptoms of angina pectoris, consider low-dose Xarelto 2.5 mg twice daily to  improve flow in the coronary arteries empirically. Could also try dual antiplatelet therapy for a short time. -Recommend Aspirin 71m daily for moderate CAD.   Lexiscan Tetrofosmin Stress Test 03/27/2019: Nondiagnostic ECG stress. Hypertensive at rest. Normal BP response to Lexiscan infusion.  Myocardial perfusion is abnormal. There is a partially reversible moderate sized mild ischemia in the inferior and inferolateral region.  Stress LV EF: 61%.  Mild diaphragmatic attenuation noted.  Intermediate risk study. No previous exam available for comparison.   Echocardiogram 03/27/2019: 1. Normal LV systolic function with visual EF 50-55%. Left ventricle  cavity is normal in size. Normal global wall motion. Normal diastolic  filling pattern, normal LAP. Mild left ventricular hypertrophy. Calculated  EF 50%.  2. Aortic sclerosis without stenosis.  3. No significant valvular abnormalities.  4. No prior study for comparison.    Neuro/Psych negative neurological ROS  negative psych ROS   GI/Hepatic negative GI ROS, Neg liver ROS,   Endo/Other  negative endocrine ROSdiabetes, Oral Hypoglycemic Agents  Renal/GU negative Renal ROS  negative genitourinary   Musculoskeletal  (+) Arthritis ,   Abdominal (+) + obese,   Peds  Hematology negative hematology ROS (+)   Anesthesia Other Findings   Reproductive/Obstetrics                             Anesthesia Physical  Anesthesia Plan  ASA: III  Anesthesia Plan: MAC   Post-op Pain Management:    Induction: Intravenous  PONV Risk Score and Plan: 1 and Treatment may vary due to age or medical condition and Midazolam  Airway Management Planned: Nasal Cannula  Additional Equipment:   Intra-op Plan:   Post-operative Plan:   Informed Consent: I have reviewed the patients History and Physical, chart, labs  and discussed the procedure including the risks, benefits and alternatives for the  proposed anesthesia with the patient or authorized representative who has indicated his/her understanding and acceptance.     Dental advisory given  Plan Discussed with: CRNA  Anesthesia Plan Comments: (See APP note by Durel Salts, FNP)        Anesthesia Quick Evaluation

## 2019-11-29 NOTE — Op Note (Addendum)
[facility] Patient Name: Joe Stewart Procedure Date: 11/29/2019 MRN: [id number] Attending MD: Justice Britain , MD Date of Birth: [birth year] CSN: [id number] Age: 64 Admit Type: Outpatient Procedure:                Colonoscopy Indications:              Excision of known cecal polyp and history of prior                            polyps found earlier this year Providers:                Justice Britain, MD, Baird Cancer, RN, Ladona Ridgel, Technician Referring MD:             [name] Medicines:                Monitored Anesthesia Care, Cipro 782 mg IV Complications:            No immediate complications. Estimated Blood Loss:     Estimated blood loss was minimal. Procedure:                Pre-Anesthesia Assessment:                           - Prior to the procedure, a History and Physical                            was performed, and patient medications and                            allergies were reviewed. The patient's tolerance of                            previous anesthesia was also reviewed. The risks                            and benefits of the procedure and the sedation                            options and risks were discussed with the patient.                            All questions were answered, and informed consent                            was obtained. Prior Anticoagulants: The patient has                            taken no previous anticoagulant or antiplatelet                            agents except for aspirin. ASA Grade Assessment:  III - A patient with severe systemic disease. After                            reviewing the risks and benefits, the patient was                            deemed in satisfactory condition to undergo the                            procedure.                           After obtaining informed consent, the colonoscope                            was  passed under direct vision. Throughout the                            procedure, the patient's blood pressure, pulse, and                            oxygen saturations were monitored continuously. The                            CF-HQ190L ([id number] Olympus colonoscope was                            introduced through the anus and advanced to the the                            cecum, identified by appendiceal orifice and                            ileocecal valve. The colonoscopy was technically                            difficult and complex due to a redundant colon and                            significant looping. Successful completion of the                            procedure was aided by changing the patient's                            position, using manual pressure, withdrawing and                            reinserting the scope, straightening and shortening                            the scope to obtain bowel loop reduction and using  scope torsion. The patient tolerated the procedure.                            The quality of the bowel preparation was adequate.                            The ileocecal valve, appendiceal orifice, and                            rectum were photographed. Scope In: 8:05:35 AM Scope Out: 9:09:04 AM Scope Withdrawal Time: 0 hours 59 minutes 8 seconds  Total Procedure Duration: 1 hour 3 minutes 29 seconds  Findings:      The digital rectal exam findings include hemorrhoids. Pertinent       negatives include no palpable rectal lesions.      A moderate amount of liquid semi-liquid stool was found in the entire       colon, interfering with visualization. Lavage of the area was performed       using copious amounts, resulting in clearance with adequate       visualization. Removal of pills from the cecal base was accomplished       with a Roth net.      An at least 35 mm polypoid lesion was found in the cecum. The lesion was        mixed and lateral spreading. There was evidence of mild ulceration in       the head of the lesion but not the base. Preparations were made for       mucosal resection. NBI imaging and White-light endoscopy was done to       demarcate the borders of the lesion. Orise gel was injected to raise the       lesion - the majority of the lesion lifted, even the area where there       had been ulceration above. I placed the snare with what looked to be       attempt at complete resection, but after a significant time of trying to       remove the lesion including having to use Endocut + Coagulation current,       it finally came off. There was evidence of small regions of polyp on the       periphery, so a piecemeal mucosal resection using a snare was performed.       Resection and retrieval were complete. Coagulation for tissue       destruction of the margin of resection using STSC was successful. To       prevent bleeding after mucosal resection, five hemostatic clips were       successfully placed (MR conditional). There was no bleeding during, or       at the end, of the procedure. No evidence of a target sign was noted on       the polyp.      Two sessile polyps were found in the transverse colon. The polyps were 4       to 5 mm in size. These polyps were removed with a cold snare. Resection       and retrieval were complete.      Normal mucosa was found in the entire colon otherwise.      Non-bleeding non-thrombosed internal hemorrhoids were found during  retroflexion, during perianal exam and during digital exam. The       hemorrhoids were Grade II (internal hemorrhoids that prolapse but reduce       spontaneously). Impression:               - Hemorrhoids found on digital rectal exam.                           - Stool in the entire examined colon.                           - Polypoid lesion in the cecum. Complete removal                            was accomplished via piecemeal  mucosal resection.                            STSC to the margin performed. Clips (MR                            conditional) were placed to decrease risk of                            post-interventional bleeding. ABx given due to char                            noted on resection base to decrease                            post-polypectomy syndrome.                           - Two 4 to 5 mm polyps in the transverse colon,                            removed with a cold snare. Resected and retrieved.                           - Normal mucosa in the entire examined colon                            otherwise.                           - Non-bleeding non-thrombosed internal hemorrhoids. Moderate Sedation:      Not Applicable - Patient had care per Anesthesia. Recommendation:           - The patient will be observed post-procedure,                            until all discharge criteria are met.                           - Discharge patient to home.                           -  Patient has a contact number available for                            emergencies. The signs and symptoms of potential                            delayed complications were discussed with the                            patient. Return to normal activities tomorrow.                            Written discharge instructions were provided to the                            patient.                           - High fiber diet.                           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                           - Hold Aspirin for next 2-weeks to decrease                            post-interventional bleeding risk.                           - Monitor for signs/symptoms of                            perforation/bleeding.                            - Await pathology results.                           - Repeat colonoscopy in 6-9 months for surveillance                            if no concerning findings of pathology.                           - The findings and recommendations were discussed  with the patient.                           - The findings and recommendations were discussed                            with the patient's family. Procedure Code(s):        --- Professional ---                           [phone], Colonoscopy, flexible; with endoscopic                            mucosal resection                           45385, 15, Colonoscopy, flexible; with removal of                            tumor(s), polyp(s), or other lesion(s) by snare                            technique                           45379, Colonoscopy, flexible; with removal of                            foreign body(s) Diagnosis Code(s):        --- Professional ---                           K64.1, Second degree hemorrhoids                           D49.0, Neoplasm of unspecified behavior of                            digestive system                           K63.5, Polyp of colon CPT copyright 2019 American Medical Association. All rights reserved. The codes documented in this report are preliminary and upon coder review may  be revised to meet current compliance requirements. Justice Britain, MD 11/29/2019 9:39:37 AM Number of Addenda: 0

## 2019-11-29 NOTE — Anesthesia Postprocedure Evaluation (Signed)
Anesthesia Post Note  Patient: Axl Rodino  Procedure(s) Performed: COLONOSCOPY WITH PROPOFOL (N/A ) ENDOSCOPIC MUCOSAL RESECTION (N/A ) ESOPHAGOGASTRODUODENOSCOPY (EGD) WITH PROPOFOL (N/A ) BIOPSY SUBMUCOSAL LIFTING INJECTION HOT HEMOSTASIS (ARGON PLASMA COAGULATION/BICAP) (N/A ) POLYPECTOMY     Patient location during evaluation: Endoscopy Anesthesia Type: MAC Level of consciousness: awake and alert Pain management: pain level controlled Vital Signs Assessment: post-procedure vital signs reviewed and stable Respiratory status: spontaneous breathing, nonlabored ventilation and respiratory function stable Cardiovascular status: blood pressure returned to baseline and stable Postop Assessment: no apparent nausea or vomiting Anesthetic complications: no   No complications documented.  Last Vitals:  Vitals:   11/29/19 0917 11/29/19 0925  BP: 121/66   Pulse: 68   Resp: 15   Temp: 36.5 C   SpO2: 100% 98%    Last Pain:  Vitals:   11/29/19 0917  TempSrc: Oral  PainSc: 0-No pain                 Lynda Rainwater

## 2019-11-30 ENCOUNTER — Other Ambulatory Visit: Payer: Self-pay

## 2019-11-30 LAB — SURGICAL PATHOLOGY

## 2019-12-01 ENCOUNTER — Encounter (HOSPITAL_COMMUNITY): Payer: Self-pay | Admitting: Gastroenterology

## 2019-12-07 ENCOUNTER — Encounter: Payer: Self-pay | Admitting: Gastroenterology

## 2019-12-09 ENCOUNTER — Other Ambulatory Visit: Payer: Self-pay | Admitting: Surgical

## 2019-12-22 NOTE — Progress Notes (Signed)
[facility] Note  12/27/2019     CHIEF COMPLAINT Patient presents for Retina Follow Up   HISTORY OF PRESENT ILLNESS: Joe Stewart is a 64 y.o. male who presents to the clinic today for:  HPI    Retina Follow Up    Patient presents with  Diabetic Retinopathy.  In both eyes.  This started 4 weeks ago.  I, the attending physician,  performed the HPI with the patient and updated documentation appropriately.          Comments    Patient here for 4 weeks for retina follow up for NPDR OU. Patient states vision doing pretty good. After last injection OD sees a triangle of 3 dots.  Almost like a bionic eye with scope. No eye pain.       Last edited by Bernarda Caffey, MD on 12/27/2019  9:16 AM. (History)    Pt states  Referring physician: Shirleen Schirmer, [address] [place], [place] [address]   HISTORICAL INFORMATION:   Selected notes from the MEDICAL RECORD NUMBER Referred by [name], PA LEE: 09.15.21 BCVA OD: 20/20 OS: 20/20 Ocular Hx- NPDR with edema  PMH- DM, HTN   CURRENT MEDICATIONS: No current outpatient medications on file. (Ophthalmic Drugs)   No current facility-administered medications for this visit. (Ophthalmic Drugs)   Current Outpatient Medications (Other)  Medication Sig  . amLODipine (NORVASC) 10 MG tablet Take 1 tablet (10 mg total) by mouth in the morning. (Patient taking differently: Take 10 mg by mouth daily. )  . aspirin ([employer] ASPIRIN ADULT LOW DOSE) 81 MG chewable tablet Chew 1 tablet (81 mg total) by mouth daily.  Marland Kitchen atorvastatin (LIPITOR) 40 MG tablet Take 1 tablet (40 mg total) by mouth daily. (Patient taking differently: Take 40 mg by mouth at bedtime. )  . Cyanocobalamin (VITAMIN B-12 PO) Take 1 tablet by mouth daily.  . hydrochlorothiazide (HYDRODIURIL) 25 MG tablet TAKE 1 TABLET (25 MG TOTAL) BY MOUTH IN THE MORNING. (Patient taking differently: Take 25 mg by mouth at bedtime. )  . losartan (COZAAR) 25  MG tablet Take 1 tablet (25 mg total) by mouth every evening. (Patient taking differently: Take 25 mg by mouth at bedtime. )  . metFORMIN (GLUCOPHAGE) 1000 MG tablet Take 1 tablet (1,000 mg total) by mouth 2 (two) times daily with a meal.  . methocarbamol (ROBAXIN) 500 MG tablet Take 1 tablet (500 mg total) by mouth every 8 (eight) hours as needed for muscle spasms. (Patient taking differently: Take 500 mg by mouth daily with supper. )  . metoprolol succinate (TOPROL-XL) 25 MG 24 hr tablet Take 1 tablet (25 mg total) by mouth daily. Take with or immediately following a meal. Hold if systolic blood pressure (top blood pressure number) less than 100 mmHg or heart rate less than 60 bpm (pulse). (Patient taking differently: Take 25 mg by mouth at bedtime. Take with or immediately following a meal. Hold if systolic blood pressure (top blood pressure number) less than 100 mmHg or heart rate less than 60 bpm (pulse).)  . omeprazole (PRILOSEC) 40 MG capsule Take 1 capsule (40 mg total) by mouth 2 (two) times daily before a meal.  . VITAMIN E PO Take 1 tablet by mouth daily.   No current facility-administered medications for this visit. (Other)      REVIEW OF SYSTEMS: ROS    Positive for: Musculoskeletal, Endocrine, Eyes   Negative for: Constitutional, Gastrointestinal, Neurological, Skin, Genitourinary,  HENT, Cardiovascular, Respiratory, Psychiatric, Allergic/Imm, Heme/Lymph   Last edited by Theodore Demark, COA on 12/27/2019  8:25 AM. (History)       ALLERGIES No Known Allergies  PAST MEDICAL HISTORY Past Medical History:  Diagnosis Date  . Arthritis   . Diabetes mellitus without complication ([place])   . Hyperlipidemia   . Hypertension   . Obesity    Past Surgical History:  Procedure Laterality Date  . BIOPSY  11/29/2019   Procedure: BIOPSY;  Surgeon: Irving Copas., MD;  Location: [facility];  Service: Gastroenterology;;  . COLONOSCOPY WITH PROPOFOL N/A 11/29/2019    Procedure: COLONOSCOPY WITH PROPOFOL;  Surgeon: Irving Copas., MD;  Location: [facility];  Service: Gastroenterology;  Laterality: N/A;  . ENDOSCOPIC MUCOSAL RESECTION N/A 11/29/2019   Procedure: ENDOSCOPIC MUCOSAL RESECTION;  Surgeon: Rush Landmark Telford Nab., MD;  Location: [facility];  Service: Gastroenterology;  Laterality: N/A;  . ESOPHAGOGASTRODUODENOSCOPY (EGD) WITH PROPOFOL N/A 11/29/2019   Procedure: ESOPHAGOGASTRODUODENOSCOPY (EGD) WITH PROPOFOL;  Surgeon: Rush Landmark Telford Nab., MD;  Location: [facility];  Service: Gastroenterology;  Laterality: N/A;  . EYE SURGERY Left    had to have eye flushed after getting costic sodium in L eye  . HEMOSTASIS CLIP PLACEMENT  11/29/2019   Procedure: HEMOSTASIS CLIP PLACEMENT;  Surgeon: Irving Copas., MD;  Location: [facility];  Service: Gastroenterology;;  . HERNIA REPAIR    . HERNIA REPAIR     35 years ago  . HOT HEMOSTASIS N/A 11/29/2019   Procedure: HOT HEMOSTASIS (ARGON PLASMA COAGULATION/BICAP);  Surgeon: Irving Copas., MD;  Location: [facility];  Service: Gastroenterology;  Laterality: N/A;  . LEFT HEART CATH AND CORONARY ANGIOGRAPHY N/A 04/11/2019   Procedure: LEFT HEART CATH AND CORONARY ANGIOGRAPHY;  Surgeon: Adrian Prows, MD;  Location: [facility];  Service: Cardiovascular;  Laterality: N/A;  . POLYPECTOMY  11/29/2019   Procedure: POLYPECTOMY;  Surgeon: Irving Copas., MD;  Location: [facility];  Service: Gastroenterology;;  . RENAL ANGIOGRAPHY Bilateral 04/11/2019   Procedure: RENAL ANGIOGRAPHY;  Surgeon: Adrian Prows, MD;  Location: [facility];  Service: Cardiovascular;  Laterality: Bilateral;  . SUBMUCOSAL LIFTING INJECTION  11/29/2019   Procedure: SUBMUCOSAL LIFTING INJECTION;  Surgeon: Irving Copas., MD;  Location: [facility];  Service: Gastroenterology;;  . TOTAL KNEE ARTHROPLASTY Left 08/01/2019   Procedure: LEFT TOTAL KNEE ARTHROPLASTY;  Surgeon: Meredith Pel, MD;  Location: [facility];  Service: Orthopedics;  Laterality: Left;    FAMILY HISTORY Family History  Problem Relation Age of Onset  . Diabetes Mother   . COPD Mother   . Cancer Mother        unknown type   . Heart disease Father   . COPD Sister   . Healthy Brother   . Heart disease Brother   . Healthy Son   . Colon cancer Neg Hx   . Colon polyps Neg Hx   . Esophageal cancer Neg Hx   . Rectal cancer Neg Hx   . Stomach cancer Neg Hx   . Inflammatory bowel disease Neg Hx   . Liver disease Neg Hx   . Pancreatic cancer Neg Hx     SOCIAL HISTORY Social History   Tobacco Use  . Smoking status: Never Smoker  . Smokeless tobacco: Never Used  Vaping Use  . Vaping Use: Never used  Substance Use Topics  . Alcohol use: Not Currently  . Drug use: Never         OPHTHALMIC EXAM:  Base Eye Exam    Visual Acuity (Snellen - Linear)      Right Left   Dist [place] 20/20 -1 20/25 -2   Dist ph [place]  NI       Tonometry (Tonopen, 8:20 AM)      Right Left   Pressure 18 18       Pupils      Dark Light Shape React APD   Right 3 2 Round Brisk None   Left 3 2 Round Brisk None       Visual Fields (Counting fingers)      Left Right    Full Full       Extraocular Movement      Right Left    Full Full       Neuro/Psych    Oriented x3: Yes   Mood/Affect: Normal       Dilation    Both eyes: 1.0% Mydriacyl, 2.5% Phenylephrine @ 8:20 AM        Slit Lamp and Fundus Exam    Slit Lamp Exam      Right Left   Lids/Lashes Dermatochalasis - upper lid Dermatochalasis - upper lid   Conjunctiva/Sclera White and quiet White and quiet   Cornea trace Punctate epithelial erosions 1-2+ inferior Punctate epithelial erosions   Anterior Chamber Deep and quiet Deep and quiet   Iris Round and moderately dilated to 6.71mm, No NVI Round and moderately dilated to 6.34mm, No NVI   Lens 2+ Nuclear sclerosis, 2+ Cortical cataract, +Vacuoles 2+ Nuclear sclerosis, 2+ Cortical cataract, +Vacuoles    Vitreous Vitreous syneresis Vitreous syneresis       Fundus Exam      Right Left   Disc Pink and Sharp, +Peripapillary atrophy Pink and Sharp, +Peripapillary atrophy, Compact   C/D Ratio 0.2 0.1   Macula Flat, Blunted foveal reflex, scattered Microaneurysms greatest SN macula with focal cystic changes / edema -- improved Flat, Blunted foveal reflex, central cystic changes--slightly improved, scattered MA   Vessels attenuated, mild tortuousity attenuated, mild tortuousity   Periphery Attached, pigmented cystoid degeneration ST periphery, rare scattered MA/IRH ST periphery, focal pigmented VR tuft at 0900 Attached, scattered MA / IRH greatest posteriorly, focal pigmented CR scarring along distal IT arcade (0130)             IMAGING AND PROCEDURES  Imaging and Procedures for 12/27/2019  OCT, Retina - OU - Both Eyes       Right Eye Quality was good. Central Foveal Thickness: 354. Progression has improved. Findings include normal foveal contour, intraretinal fluid, no SRF (Interval improvement in SN IRF, mild interval increase in central IRF).   Left Eye Quality was good. Central Foveal Thickness: 356. Progression has improved. Findings include intraretinal fluid, no SRF, vitreomacular adhesion , normal foveal contour (Mild interval improvement in IRF/foveal profile).   Notes *Images captured and stored on drive  Diagnosis / Impression:  DME OU OD: Interval improvement in SN IRF, mild interval increase in central IRF OS: Mild interval improvement in IRF/foveal profile  Clinical management:  See below  Abbreviations: NFP - Normal foveal profile. CME - cystoid macular edema. PED - pigment epithelial detachment. IRF - intraretinal fluid. SRF - subretinal fluid. EZ - ellipsoid zone. ERM - epiretinal membrane. ORA - outer retinal atrophy. ORT - outer retinal tubulation. SRHM - subretinal hyper-reflective material. IRHM - intraretinal hyper-reflective material        Intravitreal  Injection, Pharmacologic Agent - OD - Right Eye  Time Out 12/27/2019. 9:09 AM. Confirmed correct patient, procedure, site, and patient consented.   Anesthesia Topical anesthesia was used. Anesthetic medications included Lidocaine 2%, Proparacaine 0.5%.   Procedure Preparation included 5% betadine to ocular surface, eyelid speculum. A (32g) needle was used.   Injection:  1.25 mg Bevacizumab (AVASTIN) SOLN   NDC: 93810-175-10, Lot: [id number], Expiration date: 01/19/2020   Route: Intravitreal, Site: Right Eye, Waste: 0.05 mL  Post-op Post injection exam found visual acuity of at least counting fingers. The patient tolerated the procedure well. There were no complications. The patient received written and verbal post procedure care education.                 ASSESSMENT/PLAN:    ICD-10-CM   1. Moderate nonproliferative diabetic retinopathy of both eyes with macular edema associated with type 2 diabetes mellitus (HCC)  O24.2353 Intravitreal Injection, Pharmacologic Agent - OD - Right Eye    Bevacizumab (AVASTIN) SOLN 1.25 mg  2. Essential hypertension  I10   3. Retinal edema  H35.81 OCT, Retina - OU - Both Eyes  4. Hypertensive retinopathy of both eyes  H35.033   5. Combined forms of age-related cataract of both eyes  H25.813     1,2. Moderate Non-proliferative diabetic retinopathy w/ DME OU  - S/P IVA OD #1 (10.26.21)             - A1c 7.4% on 6.4.21 - BCVA 20/25 OU - exam shows scattered MA and mild edema/cystic changes - FA (09.29.21) shows late leaking MA OU; no NV OU  - OCT OD: Interval increase in IRF / edema SN macula; OS: Persistent central cystic changes  - recommend IVA OD #2 today, 11.24.21 for persistent DME OD  - pt wishes to proceed  - RBA of procedure discussed, questions answered  - informed consent obtained and signed  - see procedure note - f/u in 4 wks -- DFE/OCT, possible injection  3,4. Hypertensive retinopathy OU - discussed importance of  tight BP control - monitor  5. Mixed Cataract OU - The symptoms of cataract, surgical options, and treatments and risks were discussed with patient. - discussed diagnosis and progression - not yet visually significant - monitor for now   Ophthalmic Meds Ordered this visit:  Meds ordered this encounter  Medications  . Bevacizumab (AVASTIN) SOLN 1.25 mg       Return in about 4 weeks (around 01/24/2020) for f/u NPDR OU, DFE, OCT.  There are no Patient Instructions on file for this visit.  This document serves as a record of services personally performed by Gardiner Sleeper, MD, PhD. It was created on their behalf by Leeann Must, [place], an ophthalmic technician. The creation of this record is the provider's dictation and/or activities during the visit.    Electronically signed by: Leeann Must, COA 11.19.2021 1:45 AM   This document serves as a record of services personally performed by Gardiner Sleeper, MD, PhD. It was created on their behalf by San Jetty. Owens Shark, OA an ophthalmic technician. The creation of this record is the provider's dictation and/or activities during the visit.    Electronically signed by: San Jetty. Owens Shark, [place] 11.24.2021 1:45 AM  Gardiner Sleeper, M.D., Ph.D. Diseases & Surgery of the Retina and [place] 12/27/2019   I have reviewed the above documentation for accuracy and completeness, and I agree with the above. Gardiner Sleeper, M.D., Ph.D. 12/29/19 1:45 AM   Abbreviations: M myopia (nearsighted); A  astigmatism; H hyperopia (farsighted); P presbyopia; Mrx spectacle prescription;  CTL contact lenses; OD right eye; OS left eye; OU both eyes  XT exotropia; ET esotropia; PEK punctate epithelial keratitis; PEE punctate epithelial erosions; DES dry eye syndrome; MGD meibomian gland dysfunction; ATs artificial tears; PFAT's preservative free artificial tears; [place] nuclear sclerotic cataract; PSC posterior subcapsular cataract; ERM  epi-retinal membrane; PVD posterior vitreous detachment; RD retinal detachment; DM diabetes mellitus; DR diabetic retinopathy; NPDR non-proliferative diabetic retinopathy; PDR proliferative diabetic retinopathy; CSME clinically significant macular edema; DME diabetic macular edema; dbh dot blot hemorrhages; CWS cotton wool spot; POAG primary open angle glaucoma; C/D cup-to-disc ratio; HVF humphrey visual field; GVF goldmann visual field; OCT optical coherence tomography; IOP intraocular pressure; BRVO Branch retinal vein occlusion; CRVO central retinal vein occlusion; CRAO central retinal artery occlusion; BRAO branch retinal artery occlusion; RT retinal tear; SB scleral buckle; PPV pars plana vitrectomy; VH Vitreous hemorrhage; PRP panretinal laser photocoagulation; IVK intravitreal kenalog; VMT vitreomacular traction; MH Macular hole;  NVD neovascularization of the disc; NVE neovascularization elsewhere; AREDS age related eye disease study; ARMD age related macular degeneration; POAG primary open angle glaucoma; EBMD epithelial/anterior basement membrane dystrophy; ACIOL anterior chamber intraocular lens; IOL intraocular lens; PCIOL posterior chamber intraocular lens; Phaco/IOL phacoemulsification with intraocular lens placement; [place] photorefractive keratectomy; LASIK laser assisted in situ keratomileusis; HTN hypertension; DM diabetes mellitus; COPD chronic obstructive pulmonary disease

## 2019-12-27 ENCOUNTER — Ambulatory Visit (INDEPENDENT_AMBULATORY_CARE_PROVIDER_SITE_OTHER): Payer: 59 | Admitting: Ophthalmology

## 2019-12-27 ENCOUNTER — Other Ambulatory Visit: Payer: Self-pay

## 2019-12-27 ENCOUNTER — Encounter (INDEPENDENT_AMBULATORY_CARE_PROVIDER_SITE_OTHER): Payer: Self-pay | Admitting: Ophthalmology

## 2019-12-27 DIAGNOSIS — H35033 Hypertensive retinopathy, bilateral: Secondary | ICD-10-CM | POA: Diagnosis not present

## 2019-12-27 DIAGNOSIS — H25813 Combined forms of age-related cataract, bilateral: Secondary | ICD-10-CM

## 2019-12-27 DIAGNOSIS — E113313 Type 2 diabetes mellitus with moderate nonproliferative diabetic retinopathy with macular edema, bilateral: Secondary | ICD-10-CM

## 2019-12-27 DIAGNOSIS — H3581 Retinal edema: Secondary | ICD-10-CM

## 2019-12-27 DIAGNOSIS — I10 Essential (primary) hypertension: Secondary | ICD-10-CM | POA: Diagnosis not present

## 2019-12-27 MED ORDER — BEVACIZUMAB CHEMO INJECTION 1.25MG/0.05ML SYRINGE FOR KALEIDOSCOPE
1.2500 mg | INTRAVITREAL | Status: AC | PRN
  Administered 2019-12-27: 1.25 mg via INTRAVITREAL

## 2020-01-05 ENCOUNTER — Ambulatory Visit (INDEPENDENT_AMBULATORY_CARE_PROVIDER_SITE_OTHER): Payer: 59 | Admitting: Registered Nurse

## 2020-01-05 ENCOUNTER — Other Ambulatory Visit: Payer: Self-pay

## 2020-01-05 ENCOUNTER — Encounter: Payer: Self-pay | Admitting: Registered Nurse

## 2020-01-05 VITALS — BP 136/68 | HR 90 | Temp 97.7°F | Resp 18 | Ht 68.0 in | Wt 259.4 lb

## 2020-01-05 DIAGNOSIS — E119 Type 2 diabetes mellitus without complications: Secondary | ICD-10-CM

## 2020-01-05 LAB — POCT GLYCOSYLATED HEMOGLOBIN (HGB A1C): Hemoglobin A1C: 8.3 % — AB (ref 4.0–5.6)

## 2020-01-05 LAB — GLUCOSE, POCT (MANUAL RESULT ENTRY): POC Glucose: 225 mg/dl — AB (ref 70–99)

## 2020-01-05 MED ORDER — JANUMET 50-1000 MG PO TABS: 1.0000 | ORAL_TABLET | Freq: Two times a day (BID) | ORAL | 1 refills | Status: DC

## 2020-01-05 NOTE — Patient Instructions (Signed)
° ° ° °  If you have lab work done today you will be contacted with your lab results within the next 2 weeks.  If you have not heard from us then please contact us. The fastest way to get your results is to register for My Chart. ° ° °IF you received an x-ray today, you will receive an invoice from [facility]. Please contact [facility] at [phone] with questions or concerns regarding your invoice.  ° °IF you received labwork today, you will receive an invoice from [facility]. Please contact [facility] at [phone] with questions or concerns regarding your invoice.  ° °Our billing staff will not be able to assist you with questions regarding bills from these companies. ° °You will be contacted with the lab results as soon as they are available. The fastest way to get your results is to activate your My Chart account. Instructions are located on the last page of this paperwork. If you have not heard from us regarding the results in 2 weeks, please contact this office. °  ° ° ° °

## 2020-01-05 NOTE — Progress Notes (Signed)
Established Patient Office Visit  Subjective:  Patient ID: Joe Stewart, male    DOB: [birth year]  Age: 64 y.o. MRN: [id number]  CC:  Chief Complaint  Patient presents with  . Follow-up    Patient states he is here for his 6 month follow up on his diabetes. Per patient he has no other concerns     HPI [name] presents for t2dm follow up   Last A1c: Currently taking: metformin 1000mg  PO bid No new complications Reports good compliance with medications Diet has been admittedly poor - knows he is slipping, plans to improve Exercise habits have been less consistent after tka, but getting better.  Otherwise no concerns   Past Medical History:  Diagnosis Date  . Arthritis   . Diabetes mellitus without complication ([place])   . Hyperlipidemia   . Hypertension   . Obesity     Past Surgical History:  Procedure Laterality Date  . BIOPSY  11/29/2019   Procedure: BIOPSY;  Surgeon: Irving Copas., MD;  Location: [facility];  Service: Gastroenterology;;  . COLONOSCOPY WITH PROPOFOL N/A 11/29/2019   Procedure: COLONOSCOPY WITH PROPOFOL;  Surgeon: Irving Copas., MD;  Location: [facility];  Service: Gastroenterology;  Laterality: N/A;  . ENDOSCOPIC MUCOSAL RESECTION N/A 11/29/2019   Procedure: ENDOSCOPIC MUCOSAL RESECTION;  Surgeon: Rush Landmark Telford Nab., MD;  Location: [facility];  Service: Gastroenterology;  Laterality: N/A;  . ESOPHAGOGASTRODUODENOSCOPY (EGD) WITH PROPOFOL N/A 11/29/2019   Procedure: ESOPHAGOGASTRODUODENOSCOPY (EGD) WITH PROPOFOL;  Surgeon: Rush Landmark Telford Nab., MD;  Location: [facility];  Service: Gastroenterology;  Laterality: N/A;  . EYE SURGERY Left    had to have eye flushed after getting costic sodium in L eye  . HEMOSTASIS CLIP PLACEMENT  11/29/2019   Procedure: HEMOSTASIS CLIP PLACEMENT;  Surgeon: Irving Copas., MD;  Location: [facility];  Service: Gastroenterology;;  . HERNIA REPAIR    . HERNIA REPAIR     35  years ago  . HOT HEMOSTASIS N/A 11/29/2019   Procedure: HOT HEMOSTASIS (ARGON PLASMA COAGULATION/BICAP);  Surgeon: Irving Copas., MD;  Location: [facility];  Service: Gastroenterology;  Laterality: N/A;  . LEFT HEART CATH AND CORONARY ANGIOGRAPHY N/A 04/11/2019   Procedure: LEFT HEART CATH AND CORONARY ANGIOGRAPHY;  Surgeon: Adrian Prows, MD;  Location: [facility];  Service: Cardiovascular;  Laterality: N/A;  . POLYPECTOMY  11/29/2019   Procedure: POLYPECTOMY;  Surgeon: Irving Copas., MD;  Location: [facility];  Service: Gastroenterology;;  . RENAL ANGIOGRAPHY Bilateral 04/11/2019   Procedure: RENAL ANGIOGRAPHY;  Surgeon: Adrian Prows, MD;  Location: [facility];  Service: Cardiovascular;  Laterality: Bilateral;  . SUBMUCOSAL LIFTING INJECTION  11/29/2019   Procedure: SUBMUCOSAL LIFTING INJECTION;  Surgeon: Irving Copas., MD;  Location: [facility];  Service: Gastroenterology;;  . 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 Diabetes Mother   . COPD Mother   . Cancer Mother        unknown type   . Heart disease Father   . COPD Sister   . Healthy Brother   . Heart disease Brother   . Healthy Son   . Colon cancer Neg Hx   . Colon polyps Neg Hx   . Esophageal cancer Neg Hx   . Rectal cancer Neg Hx   . Stomach  cancer Neg Hx   . Inflammatory bowel disease Neg Hx   . Liver disease Neg Hx   . Pancreatic cancer Neg Hx     Social History   Socioeconomic History  . Marital status: Single    Spouse name: Not on file  . Number of children: 1  . Years of education: Not on file  . Highest education level: Not on file  Occupational History  . Not on file  Tobacco Use  . Smoking status: Never Smoker  . Smokeless tobacco: Never Used  Vaping Use  . Vaping Use:  Never used  Substance and Sexual Activity  . Alcohol use: Not Currently  . Drug use: Never  . Sexual activity: Not Currently  Other Topics Concern  . Not on file  Social History Narrative  . Not on file   Social Determinants of Health   Financial Resource Strain:   . Difficulty of Paying Living Expenses: Not on file  Food Insecurity:   . Worried About Charity fundraiser in the Last Year: Not on file  . Ran Out of Food in the Last Year: Not on file  Transportation Needs:   . Lack of Transportation (Medical): Not on file  . Lack of Transportation (Non-Medical): Not on file  Physical Activity:   . Days of Exercise per Week: Not on file  . Minutes of Exercise per Session: Not on file  Stress:   . Feeling of Stress : Not on file  Social Connections:   . Frequency of Communication with Friends and Family: Not on file  . Frequency of Social Gatherings with Friends and Family: Not on file  . Attends Religious Services: Not on file  . Active Member of Clubs or Organizations: Not on file  . Attends Archivist Meetings: Not on file  . Marital Status: Not on file  Intimate Partner Violence:   . Fear of Current or Ex-Partner: Not on file  . Emotionally Abused: Not on file  . Physically Abused: Not on file  . Sexually Abused: Not on file    Outpatient Medications Prior to Visit  Medication Sig Dispense Refill  . amLODipine (NORVASC) 10 MG tablet Take 1 tablet (10 mg total) by mouth in the morning. (Patient taking differently: Take 10 mg by mouth daily. ) 90 tablet 1  . aspirin ([employer] ASPIRIN ADULT LOW DOSE) 81 MG chewable tablet Chew 1 tablet (81 mg total) by mouth daily. 30 tablet 3  . Cyanocobalamin (VITAMIN B-12 PO) Take 1 tablet by mouth daily.    . hydrochlorothiazide (HYDRODIURIL) 25 MG tablet TAKE 1 TABLET (25 MG TOTAL) BY MOUTH IN THE MORNING. (Patient taking differently: Take 25 mg by mouth at bedtime. ) 30 tablet 5  . metFORMIN (GLUCOPHAGE) 1000 MG tablet Take 1  tablet (1,000 mg total) by mouth 2 (two) times daily with a meal. 180 tablet 3  . methocarbamol (ROBAXIN) 500 MG tablet Take 1 tablet (500 mg total) by mouth every 8 (eight) hours as needed for muscle spasms. (Patient taking differently: Take 500 mg by mouth daily with supper. ) 30 tablet 0  . omeprazole (PRILOSEC) 40 MG capsule Take 1 capsule (40 mg total) by mouth 2 (two) times daily before a meal. 60 capsule 4  . VITAMIN E PO Take 1 tablet by mouth daily.    Marland Kitchen atorvastatin (LIPITOR) 40 MG tablet Take 1 tablet (40 mg total) by mouth daily. (Patient taking differently: Take 40 mg by mouth at bedtime. )  90 tablet 3  . losartan (COZAAR) 25 MG tablet Take 1 tablet (25 mg total) by mouth every evening. (Patient taking differently: Take 25 mg by mouth at bedtime. ) 90 tablet 3  . metoprolol succinate (TOPROL-XL) 25 MG 24 hr tablet Take 1 tablet (25 mg total) by mouth daily. Take with or immediately following a meal. Hold if systolic blood pressure (top blood pressure number) less than 100 mmHg or heart rate less than 60 bpm (pulse). (Patient taking differently: Take 25 mg by mouth at bedtime. Take with or immediately following a meal. Hold if systolic blood pressure (top blood pressure number) less than 100 mmHg or heart rate less than 60 bpm (pulse).) 90 tablet 1   No facility-administered medications prior to visit.    No Known Allergies  ROS Review of Systems  Constitutional: Negative.   HENT: Negative.   Eyes: Negative.   Respiratory: Negative.   Cardiovascular: Negative.   Gastrointestinal: Negative.   Genitourinary: Negative.   Musculoskeletal: Negative.   Skin: Negative.   Neurological: Negative.   Psychiatric/Behavioral: Negative.       Objective:    Physical Exam Constitutional:      General: He is not in acute distress.    Appearance: Normal appearance. He is normal weight. He is not ill-appearing, toxic-appearing or diaphoretic.  Cardiovascular:     Rate and Rhythm: Normal  rate and regular rhythm.     Heart sounds: Normal heart sounds. No murmur heard.  No friction rub. No gallop.   Pulmonary:     Effort: Pulmonary effort is normal. No respiratory distress.     Breath sounds: Normal breath sounds. No stridor. No wheezing, rhonchi or rales.  Chest:     Chest wall: No tenderness.  Neurological:     General: No focal deficit present.     Mental Status: He is alert and oriented to person, place, and time. Mental status is at baseline.  Psychiatric:        Mood and Affect: Mood normal.        Behavior: Behavior normal.        Thought Content: Thought content normal.        Judgment: Judgment normal.     BP (!) 187/70   Pulse 90   Temp 97.7 F (36.5 C) (Temporal)   Resp 18   Ht 5\' 8"  (1.727 m)   Wt 259 lb 6.4 oz (117.7 kg)   SpO2 97%   BMI 39.44 kg/m  Wt Readings from Last 3 Encounters:  01/05/20 259 lb 6.4 oz (117.7 kg)  11/29/19 254 lb (115.2 kg)  10/06/19 249 lb (112.9 kg)     There are no preventive care reminders to display for this patient.  There are no preventive care reminders to display for this patient.  No results found for: TSH Lab Results  Component Value Date   WBC 7.8 09/28/2019   HGB 12.1 (L) 09/28/2019   HCT 35.8 (L) 09/28/2019   MCV 89.5 09/28/2019   PLT 243.0 09/28/2019   Lab Results  Component Value Date   NA 137 09/28/2019   K 4.3 09/28/2019   CO2 28 09/28/2019   GLUCOSE 210 (H) 09/28/2019   BUN 22 09/28/2019   CREATININE 1.04 09/28/2019   CALCIUM 9.9 09/28/2019   ANIONGAP 14 07/27/2019   GFR 71.44 09/28/2019   Lab Results  Component Value Date   CHOL 146 04/07/2019   Lab Results  Component Value Date   HDL 37 (L) 04/07/2019  Lab Results  Component Value Date   LDLCALC 81 04/07/2019   Lab Results  Component Value Date   TRIG 163 (H) 04/07/2019   No results found for: CHOLHDL Lab Results  Component Value Date   HGBA1C 8.3 (A) 01/05/2020      Assessment & Plan:   Problem List Items  Addressed This Visit      Endocrine   Type 2 diabetes mellitus without complication, without long-term current use of insulin ([place]) - Primary   Relevant Medications   sitaGLIPtin-metformin (JANUMET) 50-1000 MG tablet   Other Relevant Orders   POCT glycosylated hemoglobin (Hb A1C) (Completed)   POCT glucose (manual entry) (Completed)      Meds ordered this encounter  Medications  . sitaGLIPtin-metformin (JANUMET) 50-1000 MG tablet    Sig: Take 1 tablet by mouth 2 (two) times daily with a meal.    Dispense:  180 tablet    Refill:  1    Order Specific Question:   Supervising Provider    Answer:   Carlota Raspberry, JEFFREY R [[id number]]    Follow-up: No follow-ups on file.   PLAN  BP wnl by visit's end  A1c unfortunately up to 8.3  Will start janumet 50-1000mg  PO bid ac  Return in 6 mo for med check  Discussed lifestyle modifications for control of htn and t2dm  Sooner with concerns  Patient encouraged to call clinic with any questions, comments, or concerns.  [name], NP

## 2020-01-24 ENCOUNTER — Encounter (INDEPENDENT_AMBULATORY_CARE_PROVIDER_SITE_OTHER): Payer: Self-pay | Admitting: Ophthalmology

## 2020-01-24 ENCOUNTER — Encounter (INDEPENDENT_AMBULATORY_CARE_PROVIDER_SITE_OTHER): Payer: Medicare Other | Admitting: Ophthalmology

## 2020-01-24 ENCOUNTER — Ambulatory Visit (INDEPENDENT_AMBULATORY_CARE_PROVIDER_SITE_OTHER): Payer: 59 | Admitting: Ophthalmology

## 2020-01-24 ENCOUNTER — Other Ambulatory Visit: Payer: Self-pay

## 2020-01-24 DIAGNOSIS — H25813 Combined forms of age-related cataract, bilateral: Secondary | ICD-10-CM

## 2020-01-24 DIAGNOSIS — H3581 Retinal edema: Secondary | ICD-10-CM

## 2020-01-24 DIAGNOSIS — I10 Essential (primary) hypertension: Secondary | ICD-10-CM

## 2020-01-24 DIAGNOSIS — H35033 Hypertensive retinopathy, bilateral: Secondary | ICD-10-CM

## 2020-01-24 DIAGNOSIS — E113313 Type 2 diabetes mellitus with moderate nonproliferative diabetic retinopathy with macular edema, bilateral: Secondary | ICD-10-CM

## 2020-01-24 MED ORDER — BEVACIZUMAB CHEMO INJECTION 1.25MG/0.05ML SYRINGE FOR KALEIDOSCOPE
1.2500 mg | INTRAVITREAL | Status: AC | PRN
  Administered 2020-01-24: 09:00:00 1.25 mg via INTRAVITREAL

## 2020-01-24 NOTE — Progress Notes (Signed)
[facility] Note  01/24/2020     CHIEF COMPLAINT Patient presents for Retina Follow Up   HISTORY OF PRESENT ILLNESS: Joe Stewart is a 64 y.o. male who presents to the clinic today for:  HPI    Retina Follow Up    Patient presents with  Diabetic Retinopathy.  In both eyes.  This started 4 weeks ago.  I, the attending physician,  performed the HPI with the patient and updated documentation appropriately.          Comments    Patient here for 4 weeks retina follow up for NPDR OU. Patient states vision doing pretty good. No eye pain.        Last edited by Bernarda Caffey, MD on 01/24/2020  8:55 AM. (History)    Pt feels like his vision is better, his blood sugar and pressure have been WNL  Referring physician: Shirleen Schirmer, [place], [place] [id number]   HISTORICAL INFORMATION:   Selected notes from the [facility] Referred by [name], PA LEE: 09.15.21 BCVA OD: 20/20 OS: 20/20 Ocular Hx- NPDR with edema  PMH- DM, HTN   CURRENT MEDICATIONS: No current outpatient medications on file. (Ophthalmic Drugs)   No current facility-administered medications for this visit. (Ophthalmic Drugs)   Current Outpatient Medications (Other)  Medication Sig  . amLODipine (NORVASC) 10 MG tablet Take 1 tablet (10 mg total) by mouth in the morning. (Patient taking differently: Take 10 mg by mouth daily. )  . aspirin ([employer] ASPIRIN ADULT LOW DOSE) 81 MG chewable tablet Chew 1 tablet (81 mg total) by mouth daily.  Marland Kitchen atorvastatin (LIPITOR) 40 MG tablet Take 1 tablet (40 mg total) by mouth daily. (Patient taking differently: Take 40 mg by mouth at bedtime. )  . Cyanocobalamin (VITAMIN B-12 PO) Take 1 tablet by mouth daily.  . hydrochlorothiazide (HYDRODIURIL) 25 MG tablet TAKE 1 TABLET (25 MG TOTAL) BY MOUTH IN THE MORNING. (Patient taking differently: Take 25 mg by mouth at bedtime. )  . losartan (COZAAR) 25 MG tablet Take 1  tablet (25 mg total) by mouth every evening. (Patient taking differently: Take 25 mg by mouth at bedtime. )  . metFORMIN (GLUCOPHAGE) 1000 MG tablet Take 1 tablet (1,000 mg total) by mouth 2 (two) times daily with a meal.  . methocarbamol (ROBAXIN) 500 MG tablet Take 1 tablet (500 mg total) by mouth every 8 (eight) hours as needed for muscle spasms. (Patient taking differently: Take 500 mg by mouth daily with supper. )  . metoprolol succinate (TOPROL-XL) 25 MG 24 hr tablet Take 1 tablet (25 mg total) by mouth daily. Take with or immediately following a meal. Hold if systolic blood pressure (top blood pressure number) less than 100 mmHg or heart rate less than 60 bpm (pulse). (Patient taking differently: Take 25 mg by mouth at bedtime. Take with or immediately following a meal. Hold if systolic blood pressure (top blood pressure number) less than 100 mmHg or heart rate less than 60 bpm (pulse).)  . omeprazole (PRILOSEC) 40 MG capsule Take 1 capsule (40 mg total) by mouth 2 (two) times daily before a meal.  . sitaGLIPtin-metformin (JANUMET) 50-1000 MG tablet Take 1 tablet by mouth 2 (two) times daily with a meal.  . VITAMIN E PO Take 1 tablet by mouth daily.   No current facility-administered medications for this visit. (Other)      REVIEW OF SYSTEMS: ROS  Positive for: Musculoskeletal, Endocrine, Eyes   Negative for: Constitutional, Gastrointestinal, Neurological, Skin, Genitourinary, HENT, Cardiovascular, Respiratory, Psychiatric, Allergic/Imm, Heme/Lymph   Last edited by Theodore Demark, COA on 01/24/2020  8:20 AM. (History)       ALLERGIES No Known Allergies  PAST MEDICAL HISTORY Past Medical History:  Diagnosis Date  . Arthritis   . Diabetes mellitus without complication ([place])   . Hyperlipidemia   . Hypertension   . Obesity    Past Surgical History:  Procedure Laterality Date  . BIOPSY  11/29/2019   Procedure: BIOPSY;  Surgeon: Irving Copas., MD;  Location: [facility];  Service: Gastroenterology;;  . COLONOSCOPY WITH PROPOFOL N/A 11/29/2019   Procedure: COLONOSCOPY WITH PROPOFOL;  Surgeon: Irving Copas., MD;  Location: [facility];  Service: Gastroenterology;  Laterality: N/A;  . ENDOSCOPIC MUCOSAL RESECTION N/A 11/29/2019   Procedure: ENDOSCOPIC MUCOSAL RESECTION;  Surgeon: Rush Landmark Telford Nab., MD;  Location: [facility];  Service: Gastroenterology;  Laterality: N/A;  . ESOPHAGOGASTRODUODENOSCOPY (EGD) WITH PROPOFOL N/A 11/29/2019   Procedure: ESOPHAGOGASTRODUODENOSCOPY (EGD) WITH PROPOFOL;  Surgeon: Rush Landmark Telford Nab., MD;  Location: [facility];  Service: Gastroenterology;  Laterality: N/A;  . EYE SURGERY Left    had to have eye flushed after getting costic sodium in L eye  . HEMOSTASIS CLIP PLACEMENT  11/29/2019   Procedure: HEMOSTASIS CLIP PLACEMENT;  Surgeon: Irving Copas., MD;  Location: [facility];  Service: Gastroenterology;;  . HERNIA REPAIR    . HERNIA REPAIR     35 years ago  . HOT HEMOSTASIS N/A 11/29/2019   Procedure: HOT HEMOSTASIS (ARGON PLASMA COAGULATION/BICAP);  Surgeon: Irving Copas., MD;  Location: [facility];  Service: Gastroenterology;  Laterality: N/A;  . LEFT HEART CATH AND CORONARY ANGIOGRAPHY N/A 04/11/2019   Procedure: LEFT HEART CATH AND CORONARY ANGIOGRAPHY;  Surgeon: Adrian Prows, MD;  Location: [facility];  Service: Cardiovascular;  Laterality: N/A;  . POLYPECTOMY  11/29/2019   Procedure: POLYPECTOMY;  Surgeon: Irving Copas., MD;  Location: [facility];  Service: Gastroenterology;;  . RENAL ANGIOGRAPHY Bilateral 04/11/2019   Procedure: RENAL ANGIOGRAPHY;  Surgeon: Adrian Prows, MD;  Location: [facility];  Service: Cardiovascular;  Laterality: Bilateral;  . SUBMUCOSAL LIFTING INJECTION  11/29/2019   Procedure: SUBMUCOSAL LIFTING INJECTION;  Surgeon: Irving Copas., MD;  Location: [facility];  Service: Gastroenterology;;  . TOTAL KNEE  ARTHROPLASTY Left 08/01/2019   Procedure: LEFT TOTAL KNEE ARTHROPLASTY;  Surgeon: Meredith Pel, MD;  Location: [facility];  Service: Orthopedics;  Laterality: Left;    FAMILY HISTORY Family History  Problem Relation Age of Onset  . Diabetes Mother   . COPD Mother   . Cancer Mother        unknown type   . Heart disease Father   . COPD Sister   . Healthy Brother   . Heart disease Brother   . Healthy Son   . Colon cancer Neg Hx   . Colon polyps Neg Hx   . Esophageal cancer Neg Hx   . Rectal cancer Neg Hx   . Stomach cancer Neg Hx   . Inflammatory bowel disease Neg Hx   . Liver disease Neg Hx   . Pancreatic cancer Neg Hx     SOCIAL HISTORY Social History   Tobacco Use  . Smoking status: Never Smoker  . Smokeless tobacco: Never Used  Vaping Use  . Vaping Use: Never used  Substance Use Topics  . Alcohol use: Not Currently  .  Drug use: Never         OPHTHALMIC EXAM:  Base Eye Exam    Visual Acuity (Snellen - Linear)      Right Left   Dist [place] 20/25 +2 20/20 -1   Dist ph [place] 20/20 -2        Tonometry (Tonopen, 8:17 AM)      Right Left   Pressure 22 18       Pupils      Dark Light Shape React APD   Right 3 2 Round Brisk None   Left 3 2 Round Brisk None       Visual Fields (Counting fingers)      Left Right    Full Full       Extraocular Movement      Right Left    Full Full       Neuro/Psych    Oriented x3: Yes   Mood/Affect: Normal       Dilation    Both eyes: 1.0% Mydriacyl, 2.5% Phenylephrine @ 8:17 AM        Slit Lamp and Fundus Exam    Slit Lamp Exam      Right Left   Lids/Lashes Dermatochalasis - upper lid Dermatochalasis - upper lid   Conjunctiva/Sclera White and quiet White and quiet   Cornea trace Punctate epithelial erosions 1-2+ inferior Punctate epithelial erosions   Anterior Chamber Deep and quiet Deep and quiet   Iris Round and moderately dilated to 6.25mm, No NVI Round and moderately dilated to 6.44mm, No NVI   Lens 2+  Nuclear sclerosis, 2+ Cortical cataract, +Vacuoles 2+ Nuclear sclerosis, 2+ Cortical cataract, +Vacuoles   Vitreous Vitreous syneresis Vitreous syneresis       Fundus Exam      Right Left   Disc Pink and Sharp, +Peripapillary atrophy Pink and Sharp, +Peripapillary atrophy, Compact   C/D Ratio 0.2 0.1   Macula Flat, Blunted foveal reflex, scattered Microaneurysms greatest SN macula with focal cystic changes / edema -- improving Flat, Blunted foveal reflex, central cystic changes--slightly improved, scattered MA   Vessels attenuated, mild tortuousity attenuated, mild tortuousity   Periphery Attached, pigmented cystoid degeneration ST periphery, rare scattered MA/IRH ST periphery, focal pigmented VR tuft at 0900 Attached, scattered MA / IRH greatest posteriorly, focal pigmented CR scarring along distal IT arcade (0130)             IMAGING AND PROCEDURES  Imaging and Procedures for 01/24/2020  OCT, Retina - OU - Both Eyes       Right Eye Quality was good. Central Foveal Thickness: 349. Progression has improved. Findings include normal foveal contour, intraretinal fluid, no SRF (Interval improvement in IRF/cystic changes).   Left Eye Quality was good. Central Foveal Thickness: 346. Progression has improved. Findings include intraretinal fluid, no SRF, vitreomacular adhesion , normal foveal contour (interval improvement in IRF superior macula, mild cystic changes centrally).   Notes *Images captured and stored on drive  Diagnosis / Impression:  DME OU OD: Interval improvement in IRF/cystic changes OS: interval improvement in IRF superior macula, mild cystic changes centrally  Clinical management:  See below  Abbreviations: NFP - Normal foveal profile. CME - cystoid macular edema. PED - pigment epithelial detachment. IRF - intraretinal fluid. SRF - subretinal fluid. EZ - ellipsoid zone. ERM - epiretinal membrane. ORA - outer retinal atrophy. ORT - outer retinal tubulation. SRHM -  subretinal hyper-reflective material. IRHM - intraretinal hyper-reflective material        Intravitreal  Injection, Pharmacologic Agent - OD - Right Eye       Time Out 01/24/2020. 8:46 AM. Confirmed correct patient, procedure, site, and patient consented.   Anesthesia Topical anesthesia was used. Anesthetic medications included Lidocaine 2%, Proparacaine 0.5%.   Procedure Preparation included 5% betadine to ocular surface, eyelid speculum. A supplied needle was used.   Injection:  1.25 mg Bevacizumab (AVASTIN) 1.25mg /0.75mL SOLN   NDC: H061816, Lot: [id number] , Expiration date: 02/28/2020   Route: Intravitreal, Site: Right Eye, Waste: 0 mL  Post-op Post injection exam found visual acuity of at least counting fingers. The patient tolerated the procedure well. There were no complications. The patient received written and verbal post procedure care education. Post injection medications were not given.                 ASSESSMENT/PLAN:    ICD-10-CM   1. 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 Essential hypertension  I10   3. Retinal edema  H35.81 OCT, Retina - OU - Both Eyes  4. Hypertensive retinopathy of both eyes  H35.033   5. Combined forms of age-related cataract of both eyes  H25.813     1,2. Moderate Non-proliferative diabetic retinopathy w/ DME OU  - S/P IVA OD #1 (10.26.21), #2 (11.24.21)             - A1c 8.3% on 12.03.21; 7.4% on 6.4.21 - BCVA 20/20 OU - exam shows scattered MA and mild edema/cystic changes - FA (09.29.21) shows late leaking MA OU; no NV OU  - OCT OD: Interval improvement in IRF/cystic changes; OS: interval improvement in IRF superior macula, mild cystic changes centrally  - recommend IVA OD #3 today, 12.22.21 for persistent DME OD  - pt  wishes to proceed  - RBA of procedure discussed, questions answered  - informed consent obtained and signed  - see procedure note - f/u in 5-6 wks -- DFE/OCT, possible injection  3,4. Hypertensive retinopathy OU - discussed importance of tight BP control - monitor  5. Mixed Cataract OU - The symptoms of cataract, surgical options, and treatments and risks were discussed with patient. - discussed diagnosis and progression - not yet visually significant - monitor for now   Ophthalmic Meds Ordered this visit:  Meds ordered this encounter  Medications  . Bevacizumab (AVASTIN) SOLN 1.25 mg       Return for f/u 5-6 weeks, NPDR OU.  There are no Patient Instructions on file for this visit.  This document serves as a record of services personally performed by 01.04.22, MD, PhD. It was created on their behalf by Gardiner Sleeper. San Jetty, OA an ophthalmic technician. The creation of this record is the provider's dictation and/or activities during the visit.    Electronically signed by: Owens Shark. San Jetty, Owens Shark 12.22.2021 8:58 AM  01.04.2022, M.D., Ph.D. Diseases & Surgery of the Retina and Vitreous [facility]  I have reviewed the above documentation for accuracy and completeness, and I agree with the above. [facility], M.D., Ph.D. 01/24/20 8:58 AM   Abbreviations: M myopia (nearsighted); A astigmatism; H hyperopia (farsighted); P presbyopia; Mrx spectacle prescription;  CTL contact lenses; OD right eye; OS left eye; OU both eyes  XT exotropia; ET esotropia; PEK punctate epithelial keratitis; PEE punctate epithelial erosions; DES  dry eye syndrome; MGD meibomian gland dysfunction; ATs artificial tears; PFAT's preservative free artificial tears; Maud nuclear sclerotic cataract; PSC posterior subcapsular cataract; ERM epi-retinal membrane; PVD posterior vitreous detachment; RD retinal detachment; DM diabetes mellitus; DR diabetic retinopathy; NPDR  non-proliferative diabetic retinopathy; PDR proliferative diabetic retinopathy; CSME clinically significant macular edema; DME diabetic macular edema; dbh dot blot hemorrhages; CWS cotton wool spot; POAG primary open angle glaucoma; C/D cup-to-disc ratio; HVF humphrey visual field; GVF goldmann visual field; OCT optical coherence tomography; IOP intraocular pressure; BRVO Branch retinal vein occlusion; CRVO central retinal vein occlusion; CRAO central retinal artery occlusion; BRAO branch retinal artery occlusion; RT retinal tear; SB scleral buckle; PPV pars plana vitrectomy; VH Vitreous hemorrhage; PRP panretinal laser photocoagulation; IVK intravitreal kenalog; VMT vitreomacular traction; MH Macular hole;  NVD neovascularization of the disc; NVE neovascularization elsewhere; AREDS age related eye disease study; ARMD age related macular degeneration; POAG primary open angle glaucoma; EBMD epithelial/anterior basement membrane dystrophy; ACIOL anterior chamber intraocular lens; IOL intraocular lens; PCIOL posterior chamber intraocular lens; Phaco/IOL phacoemulsification with intraocular lens placement; [place] photorefractive keratectomy; LASIK laser assisted in situ keratomileusis; HTN hypertension; DM diabetes mellitus; COPD chronic obstructive pulmonary disease

## 2020-01-29 ENCOUNTER — Other Ambulatory Visit: Payer: Self-pay

## 2020-01-29 ENCOUNTER — Encounter: Payer: Self-pay | Admitting: Cardiology

## 2020-01-29 ENCOUNTER — Ambulatory Visit: Payer: 59 | Admitting: Cardiology

## 2020-01-29 VITALS — BP 150/80 | HR 75 | Ht 68.0 in | Wt 259.0 lb

## 2020-01-29 DIAGNOSIS — I4510 Unspecified right bundle-branch block: Secondary | ICD-10-CM

## 2020-01-29 DIAGNOSIS — I10 Essential (primary) hypertension: Secondary | ICD-10-CM

## 2020-01-29 DIAGNOSIS — E782 Mixed hyperlipidemia: Secondary | ICD-10-CM

## 2020-01-29 DIAGNOSIS — E6609 Other obesity due to excess calories: Secondary | ICD-10-CM

## 2020-01-29 MED ORDER — LOSARTAN POTASSIUM 50 MG PO TABS: 50.0000 mg | ORAL_TABLET | Freq: Every evening | ORAL | 0 refills | Status: DC

## 2020-01-29 NOTE — Progress Notes (Signed)
Joe Stewart Date of Birth: [birth year] MRN: [id number] Primary Care Provider:Morrow, Delfino Lovett, NP Primary Cardiologist: Joe Kras, DO, [facility] (established care 03/24/2019)   Date: 01/29/20 Last Office Visit: 07/28/2019  Chief Complaint  Patient presents with  . Hypertension    HPI  Joe Stewart is a 64 y.o. male who presents to the office with a chief complaint of " blood pressure follow-up." Patient's past medical history and cardiac risk factors include: Hypertension, obesity, osteoarthritis, non-insulin-dependent diabetes mellitus type 2, hyperlipidemia.  Since last office visit patient has undergone a left knee replacement and post surgery has done well.  He is no longer walking with a walker like he did at the last office visit.  No hospitalizations or urgent care visits for cardiovascular symptoms.  Patient is here for his 70-month follow-up post surgery and also to discuss blood pressure management.  Patient states that at home his systolic blood pressure ranges between 140-150 mmHg and does not recall his diastolic numbers.  He tries to consume a low-salt diet.  I do not have a blood pressure log to review. He has gained approximately 30 pounds over the last 6 months.   Denies any chest pain or anginal equivalent.  Overall euvolemic and denies orthopnea, paroxysmal nocturnal dyspnea or lower extremity swelling.  ALLERGIES: No Known Allergies  MEDICATION LIST PRIOR TO VISIT: Current Outpatient Medications on File Prior to Visit  Medication Sig Dispense Refill  . amLODipine (NORVASC) 10 MG tablet Take 1 tablet (10 mg total) by mouth in the morning. (Patient taking differently: Take 10 mg by mouth daily.) 90 tablet 1  . aspirin ([employer] ASPIRIN ADULT LOW DOSE) 81 MG chewable tablet Chew 1 tablet (81 mg total) by mouth daily. 30 tablet 3  . atorvastatin (LIPITOR) 40 MG tablet Take 1 tablet (40 mg total) by mouth daily. (Patient taking differently: Take 40 mg by mouth at bedtime.) 90  tablet 3  . Cyanocobalamin (VITAMIN B-12 PO) Take 1 tablet by mouth daily.    . Ferrous Sulfate Dried (HIGH POTENCY IRON) 65 MG TABS Take by mouth.    . hydrochlorothiazide (HYDRODIURIL) 25 MG tablet TAKE 1 TABLET (25 MG TOTAL) BY MOUTH IN THE MORNING. (Patient taking differently: Take 25 mg by mouth at bedtime.) 30 tablet 5  . metoprolol succinate (TOPROL-XL) 25 MG 24 hr tablet Take 1 tablet (25 mg total) by mouth daily. Take with or immediately following a meal. Hold if systolic blood pressure (top blood pressure number) less than 100 mmHg or heart rate less than 60 bpm (pulse). (Patient taking differently: Take 25 mg by mouth at bedtime. Take with or immediately following a meal. Hold if systolic blood pressure (top blood pressure number) less than 100 mmHg or heart rate less than 60 bpm (pulse).) 90 tablet 1  . omeprazole (PRILOSEC) 40 MG capsule Take 1 capsule (40 mg total) by mouth 2 (two) times daily before a meal. 60 capsule 4  . omeprazole (PRILOSEC) 40 MG capsule Take 40 mg by mouth daily.    . sitaGLIPtin-metformin (JANUMET) 50-1000 MG tablet Take 1 tablet by mouth 2 (two) times daily with a meal. 180 tablet 1   No current facility-administered medications on file prior to visit.    PAST MEDICAL HISTORY: Past Medical History:  Diagnosis Date  . Arthritis   . Diabetes mellitus without complication ([place])   . Hyperlipidemia   . Hypertension   . Obesity     PAST SURGICAL HISTORY: Past Surgical History:  Procedure Laterality Date  .  BIOPSY  11/29/2019   Procedure: BIOPSY;  Surgeon: Irving Copas., MD;  Location: [facility];  Service: Gastroenterology;;  . COLONOSCOPY WITH PROPOFOL N/A 11/29/2019   Procedure: COLONOSCOPY WITH PROPOFOL;  Surgeon: Irving Copas., MD;  Location: [facility];  Service: Gastroenterology;  Laterality: N/A;  . ENDOSCOPIC MUCOSAL RESECTION N/A 11/29/2019   Procedure: ENDOSCOPIC MUCOSAL RESECTION;  Surgeon: Rush Landmark Telford Nab., MD;   Location: [facility];  Service: Gastroenterology;  Laterality: N/A;  . ESOPHAGOGASTRODUODENOSCOPY (EGD) WITH PROPOFOL N/A 11/29/2019   Procedure: ESOPHAGOGASTRODUODENOSCOPY (EGD) WITH PROPOFOL;  Surgeon: Rush Landmark Telford Nab., MD;  Location: [facility];  Service: Gastroenterology;  Laterality: N/A;  . EYE SURGERY Left    had to have eye flushed after getting costic sodium in L eye  . HEMOSTASIS CLIP PLACEMENT  11/29/2019   Procedure: HEMOSTASIS CLIP PLACEMENT;  Surgeon: Irving Copas., MD;  Location: [facility];  Service: Gastroenterology;;  . HERNIA REPAIR    . HERNIA REPAIR     35 years ago  . HOT HEMOSTASIS N/A 11/29/2019   Procedure: HOT HEMOSTASIS (ARGON PLASMA COAGULATION/BICAP);  Surgeon: Irving Copas., MD;  Location: [facility];  Service: Gastroenterology;  Laterality: N/A;  . LEFT HEART CATH AND CORONARY ANGIOGRAPHY N/A 04/11/2019   Procedure: LEFT HEART CATH AND CORONARY ANGIOGRAPHY;  Surgeon: Adrian Prows, MD;  Location: [facility];  Service: Cardiovascular;  Laterality: N/A;  . POLYPECTOMY  11/29/2019   Procedure: POLYPECTOMY;  Surgeon: Irving Copas., MD;  Location: [facility];  Service: Gastroenterology;;  . RENAL ANGIOGRAPHY Bilateral 04/11/2019   Procedure: RENAL ANGIOGRAPHY;  Surgeon: Adrian Prows, MD;  Location: [facility];  Service: Cardiovascular;  Laterality: Bilateral;  . SUBMUCOSAL LIFTING INJECTION  11/29/2019   Procedure: SUBMUCOSAL LIFTING INJECTION;  Surgeon: Irving Copas., MD;  Location: [facility];  Service: Gastroenterology;;  . TOTAL KNEE ARTHROPLASTY Left 08/01/2019   Procedure: LEFT TOTAL KNEE ARTHROPLASTY;  Surgeon: Meredith Pel, MD;  Location: [place] [facility];  Service: Orthopedics;  Laterality: Left;    FAMILY HISTORY: The patient family history includes COPD in his mother and sister; Cancer in his mother; Diabetes in his mother; Healthy in his brother and son; Heart disease in his brother and  father.   SOCIAL HISTORY:  The patient  reports that he has never smoked. He has never used smokeless tobacco. He reports previous alcohol use. He reports that he does not use drugs.  Review of Systems  Constitutional: Negative for chills and fever.  HENT: Negative for hoarse voice and nosebleeds.   Eyes: Negative for discharge, double vision and pain.  Cardiovascular: Negative for chest pain, claudication, dyspnea on exertion, leg swelling, near-syncope, orthopnea, palpitations, paroxysmal nocturnal dyspnea and syncope.  Respiratory: Negative for hemoptysis and shortness of breath.   Musculoskeletal: Positive for arthritis and joint pain. Negative for muscle cramps and myalgias.  Gastrointestinal: Negative for abdominal pain, constipation, diarrhea, hematemesis, hematochezia, melena, nausea and vomiting.  Neurological: Negative for dizziness and light-headedness.   PHYSICAL EXAM: Vitals with BMI 01/29/2020 01/29/2020 01/05/2020  Height - 5\' 8"  -  Weight - 259 lbs -  BMI - 0000000 -  Systolic [id number]  Diastolic 80 99 68  Pulse 75 77 -   CONSTITUTIONAL: Age-appropriate gentleman,  well-nourished, hemodynamically stable, no acute distress.  SKIN: Skin is warm and dry. No rash noted. No cyanosis. No pallor. No jaundice HEAD: Normocephalic and atraumatic.  EYES: No scleral icterus MOUTH/THROAT: Moist oral membranes.  NECK: No JVD present. No thyromegaly  noted. No carotid bruits  LYMPHATIC: No visible cervical adenopathy.  CHEST Normal respiratory effort. No intercostal retractions  LUNGS: Clear to auscultations bilaterally.  No stridor. No wheezes. No rales.  CARDIOVASCULAR: Regular rate and rhythm, positive S1-S2, no murmurs rubs or gallops appreciated. ABDOMINAL: Obese, soft, nontender, nondistended, positive bowel sounds all 4 quadrants.  No apparent ascites.  EXTREMITIES: No peripheral edema  HEMATOLOGIC: No significant bruising NEUROLOGIC: Oriented to person, place, and time.  Nonfocal. Normal muscle tone.  PSYCHIATRIC: Normal mood and affect. Normal behavior. Cooperative  CARDIAC DATABASE: EKG: 01/29/2020: Normal sinus rhythm, 73 bpm, right bundle branch block, poor R wave progression, ST-T changes most likely secondary to RBBB, no significant change compared to prior EKG.    Echocardiogram: 03/27/2019:  Normal LV systolic function with visual EF 50-55%. Left ventricle cavity is normal in size. Normal global wall motion. Normal diastolic filling pattern, normal LAP. Mild left ventricular hypertrophy.Aortic sclerosis without stenosis.   Stress Testing:  Lexiscan Tetrofosmin Stress Test  03/27/2019: Myocardial perfusion is abnormal. There is a partially reversible moderate sized mild ischemia in the inferior and inferolateral region. Stress LV EF: 61%. Mild diaphragmatic attenuation noted. Intermediate risk study.   Left heart catheterization selective renal arteriogram 04/11/2019: Moderate diffuse coronary artery disease with calcification.  Right coronary artery with arteriomegaly and ectasia and slow filling. Left main: Normal. LAD arteriomegaly, ectasia noted, slow filling throughout the LAD.  Diffuse disease. Circumflex: Large vessel, arteriomegaly, diffuse ectasia noted.  Slow filling. Selective right and left renal arteriogram: Normal renal arteries.  No evidence of renal artery stenosis.  LABORATORY DATA: CBC Latest Ref Rng & Units 09/28/2019 07/27/2019 04/26/2019  WBC 4.0 - 10.5 K/uL 7.8 9.4 7.3  Hemoglobin 13.0 - 17.0 g/dL 12.1(L) 12.6(L) 13.8  Hematocrit 39.0 - 52.0 % 35.8(L) 39.1 41.8  Platelets 150.0 - 400.0 K/uL 243.0 294 189    CMP Latest Ref Rng & Units 09/28/2019 07/27/2019 05/18/2019  Glucose 70 - 99 mg/dL 210(H) 119(H) 134(H)  BUN 6 - 23 mg/dL 22 22 20   Creatinine 0.40 - 1.50 mg/dL 1.04 1.23 0.85  Sodium 135 - 145 mEq/L 137 140 142  Potassium 3.5 - 5.1 mEq/L 4.3 4.5 4.7  Chloride 96 - 112 mEq/L 101 104 104  CO2 19 - 32 mEq/L 28 22 22    Calcium 8.4 - 10.5 mg/dL 9.9 9.7 9.8    Lipid Panel     Component Value Date/Time   CHOL 146 04/07/2019 0929   TRIG 163 (H) 04/07/2019 0929   HDL 37 (L) 04/07/2019 0929   LDLCALC 81 04/07/2019 0929   LABVLDL 28 04/07/2019 0929   FINAL MEDICATION LIST END OF ENCOUNTER: Meds ordered this encounter  Medications  . losartan (COZAAR) 50 MG tablet    Sig: Take 1 tablet (50 mg total) by mouth every evening.    Dispense:  90 tablet    Refill:  0    Medications Discontinued During This Encounter  Medication Reason  . VITAMIN E PO Completed Course  . metFORMIN (GLUCOPHAGE) 1000 MG tablet Change in therapy  . methocarbamol (ROBAXIN) 500 MG tablet Completed Course  . losartan (COZAAR) 25 MG tablet      Current Outpatient Medications:  .  amLODipine (NORVASC) 10 MG tablet, Take 1 tablet (10 mg total) by mouth in the morning. (Patient taking differently: Take 10 mg by mouth daily.), Disp: 90 tablet, Rfl: 1 .  aspirin ([employer] ASPIRIN ADULT LOW DOSE) 81 MG chewable tablet, Chew 1 tablet (81 mg total) by mouth  daily., Disp: 30 tablet, Rfl: 3 .  atorvastatin (LIPITOR) 40 MG tablet, Take 1 tablet (40 mg total) by mouth daily. (Patient taking differently: Take 40 mg by mouth at bedtime.), Disp: 90 tablet, Rfl: 3 .  Cyanocobalamin (VITAMIN B-12 PO), Take 1 tablet by mouth daily., Disp: , Rfl:  .  Ferrous Sulfate Dried (HIGH POTENCY IRON) 65 MG TABS, Take by mouth., Disp: , Rfl:  .  hydrochlorothiazide (HYDRODIURIL) 25 MG tablet, TAKE 1 TABLET (25 MG TOTAL) BY MOUTH IN THE MORNING. (Patient taking differently: Take 25 mg by mouth at bedtime.), Disp: 30 tablet, Rfl: 5 .  metoprolol succinate (TOPROL-XL) 25 MG 24 hr tablet, Take 1 tablet (25 mg total) by mouth daily. Take with or immediately following a meal. Hold if systolic blood pressure (top blood pressure number) less than 100 mmHg or heart rate less than 60 bpm (pulse). (Patient taking differently: Take 25 mg by mouth at bedtime. Take with or  immediately following a meal. Hold if systolic blood pressure (top blood pressure number) less than 100 mmHg or heart rate less than 60 bpm (pulse).), Disp: 90 tablet, Rfl: 1 .  omeprazole (PRILOSEC) 40 MG capsule, Take 1 capsule (40 mg total) by mouth 2 (two) times daily before a meal., Disp: 60 capsule, Rfl: 4 .  omeprazole (PRILOSEC) 40 MG capsule, Take 40 mg by mouth daily., Disp: , Rfl:  .  sitaGLIPtin-metformin (JANUMET) 50-1000 MG tablet, Take 1 tablet by mouth 2 (two) times daily with a meal., Disp: 180 tablet, Rfl: 1 .  losartan (COZAAR) 50 MG tablet, Take 1 tablet (50 mg total) by mouth every evening., Disp: 90 tablet, Rfl: 0  IMPRESSION:    ICD-10-CM   1. Essential hypertension  I10 EKG 12-Lead    losartan (COZAAR) 50 MG tablet    Basic metabolic panel    Magnesium  2. Mixed hyperlipidemia  E78.2   3. RBBB  I45.10   4. Class 2 obesity due to excess calories without serious comorbidity with body mass index (BMI) of 39.0 to 39.9 in adult  E66.09    Z68.39      RECOMMENDATIONS:  Patient was initially referred to the office for preoperative stratification prior to his upcoming noncardiac surgery.  Given his risk factors, decreased functional capacity he did undergo an ischemic evaluation as outlined above.  Now he is postop chooses to follow-up for management of blood pressure and to address his cardiovascular risk factors.  Patient has done well after his left knee replacement; however, has gained approximately 30 pounds over the course of 6 months.  This is most likely secondary to dietary indiscretions.  Benign essential hypertension: Office blood pressure is not at goal. Medication reconciled.  Increase losartan to 50 mg p.o. daily. Blood work in 1 week to evaluate kidney function and electrolytes. Low salt diet recommended. A diet that is rich in fruits, vegetables, legumes, and low-fat dairy products and low in snacks, sweets, and meats (such as the Dietary Approaches to Stop  Hypertension [DASH] diet).  Patient is encouraged to discuss undergoing sleep study with his PCP at the upcoming office visit. Patient is asked to call the office if his systolic blood pressures are consistently greater than 140 mmHg at home.  Hyperlipidemia:   Continue Lipitor 40 mg p.o. nightly.  Recommended to take the medication at night. Patient is asked to bring his cholesterol blood work in at the next office visit. Patient denies myalgia or other side effects.  Right bundle branch block:  See above.  Class I obesity, due to excess calories. Body mass index is 39.38 kg/m.  He has gained weight since the last office visit. He is encouraged to loose weight since his left knee is now replaced and able to increase his physical activities.  I reviewed with the patient the importance of diet, regular physical activity/exercise, weight loss.    Patient is educated on increasing physical activity gradually as tolerated.  With the goal of moderate intensity exercise for 30 minutes a day 5 days a week.  Orders Placed This Encounter  Procedures  . Basic metabolic panel  . Magnesium  . EKG 12-Lead   --Continue cardiac medications as reconciled in final medication list. --Return in about 3 months (around 04/28/2020) for Follow up HTN.. Or sooner if needed. --Continue follow-up with your primary care physician regarding the management of your other chronic comorbid conditions.  Patient's questions and concerns were addressed to his satisfaction. He voices understanding of the instructions provided during this encounter.   Total encounter time 30 minutes.  This note was created using a voice recognition software as a result there may be grammatical errors inadvertently enclosed that do not reflect the nature of this encounter. Every attempt is made to correct such errors.  Joe Kras, DO, [facility]. [place] Office: [phone]

## 2020-02-10 LAB — BASIC METABOLIC PANEL
BUN/Creatinine Ratio: 20 (ref 10–24)
BUN: 22 mg/dL (ref 8–27)
CO2: 23 mmol/L (ref 20–29)
Calcium: 9.3 mg/dL (ref 8.6–10.2)
Chloride: 104 mmol/L (ref 96–106)
Creatinine, Ser: 1.09 mg/dL (ref 0.76–1.27)
GFR calc Af Amer: 81 mL/min/{1.73_m2} (ref >59)
GFR calc non Af Amer: 70 mL/min/{1.73_m2} (ref >59)
Glucose: 164 mg/dL — ABNORMAL HIGH (ref 65–99)
Potassium: 5 mmol/L (ref 3.5–5.2)
Sodium: 143 mmol/L (ref 134–144)

## 2020-02-10 LAB — MAGNESIUM: Magnesium: 1.3 mg/dL — ABNORMAL LOW (ref 1.6–2.3)

## 2020-02-11 ENCOUNTER — Other Ambulatory Visit: Payer: Self-pay | Admitting: Cardiology

## 2020-02-11 DIAGNOSIS — I10 Essential (primary) hypertension: Secondary | ICD-10-CM

## 2020-02-11 MED ORDER — MAGNESIUM OXIDE 400 MG PO CAPS
400.0000 mg | ORAL_CAPSULE | Freq: Two times a day (BID) | ORAL | 2 refills | Status: DC
Start: 2020-02-11 — End: 2020-03-18

## 2020-02-20 ENCOUNTER — Other Ambulatory Visit: Payer: Self-pay | Admitting: Cardiology

## 2020-02-20 DIAGNOSIS — I10 Essential (primary) hypertension: Secondary | ICD-10-CM

## 2020-02-27 ENCOUNTER — Other Ambulatory Visit: Payer: Self-pay

## 2020-02-27 DIAGNOSIS — I10 Essential (primary) hypertension: Secondary | ICD-10-CM

## 2020-02-27 MED ORDER — LOSARTAN POTASSIUM 50 MG PO TABS: 50.0000 mg | ORAL_TABLET | Freq: Every evening | ORAL | 0 refills | Status: DC

## 2020-03-05 ENCOUNTER — Other Ambulatory Visit: Payer: Self-pay | Admitting: Cardiology

## 2020-03-05 DIAGNOSIS — I10 Essential (primary) hypertension: Secondary | ICD-10-CM

## 2020-03-05 NOTE — Progress Notes (Signed)
[name]  03/06/2020     CHIEF COMPLAINT Patient presents for Retina Follow Up   HISTORY OF PRESENT ILLNESS: Joe Stewart is a 65 y.o. male who presents to the clinic today for:  HPI    Retina Follow Up    Patient presents with  Diabetic Retinopathy.  In both eyes.  This started months ago.  Severity is moderate.  Duration of 6 weeks.  Since onset it is stable.  I, the attending physician,  performed the HPI with the patient and updated documentation appropriately.          Comments    65 y/o male pt here for 6 wk f/u for mod NPDR w/DME OU.  No change in [place] OU.  Denies pain, FOL, new floaters.  No gtts.  BS 148 this a.m.  A1C unknown.       Last edited by Bernarda Caffey, MD on 03/06/2020  9:09 AM. (History)    Pt   Referring physician: Shirleen Schirmer, [address], [place] [address]   HISTORICAL INFORMATION:   Selected notes from the MEDICAL RECORD NUMBER Referred by [name], PA LEE: 09.15.21 BCVA OD: 20/20 OS: 20/20 Ocular Hx- NPDR with edema  PMH- DM, HTN   CURRENT MEDICATIONS: No current outpatient medications on file. (Ophthalmic Drugs)   No current facility-administered medications for this visit. (Ophthalmic Drugs)   Current Outpatient Medications (Other)  Medication Sig  . amLODipine (NORVASC) 10 MG tablet Take 1 tablet (10 mg total) by mouth in the morning. (Patient taking differently: Take 10 mg by mouth daily.)  . aspirin ([employer] ASPIRIN ADULT LOW DOSE) 81 MG chewable tablet Chew 1 tablet (81 mg total) by mouth daily.  . Cyanocobalamin (VITAMIN B-12 PO) Take 1 tablet by mouth daily.  . Ferrous Sulfate Dried (HIGH POTENCY IRON) 65 MG TABS Take by mouth.  . hydrochlorothiazide (HYDRODIURIL) 25 MG tablet TAKE 1 TABLET (25 MG TOTAL) BY MOUTH IN THE MORNING.  Marland Kitchen losartan (COZAAR) 25 MG tablet Take 25 mg by mouth daily.  Marland Kitchen losartan (COZAAR) 50 MG tablet Take 1 tablet (50 mg total) by mouth every evening.  .  magnesium oxide (MAG-OX) 400 MG tablet Take 1 tablet by mouth 2 (two) times daily.  . Magnesium Oxide 400 MG CAPS Take 1 capsule (400 mg total) by mouth in the morning and at bedtime.  Marland Kitchen omeprazole (PRILOSEC) 40 MG capsule Take 1 capsule (40 mg total) by mouth 2 (two) times daily before a meal.  . omeprazole (PRILOSEC) 40 MG capsule Take 40 mg by mouth daily.  . sitaGLIPtin-metformin (JANUMET) 50-1000 MG tablet Take 1 tablet by mouth 2 (two) times daily with a meal.  . atorvastatin (LIPITOR) 40 MG tablet TAKE 1 TABLET BY MOUTH EVERY DAY  . metoprolol succinate (TOPROL-XL) 25 MG 24 hr tablet Take 1 tablet (25 mg total) by mouth daily. Take with or immediately following a meal. Hold if systolic blood pressure (top blood pressure number) less than 100 mmHg or heart rate less than 60 bpm (pulse). (Patient taking differently: Take 25 mg by mouth at bedtime. Take with or immediately following a meal. Hold if systolic blood pressure (top blood pressure number) less than 100 mmHg or heart rate less than 60 bpm (pulse).)   No current facility-administered medications for this visit. (Other)      REVIEW OF SYSTEMS: ROS    Positive for: Musculoskeletal, Endocrine, Eyes   Negative for: Constitutional,  Gastrointestinal, Neurological, Skin, Genitourinary, HENT, Cardiovascular, Respiratory, Psychiatric, Allergic/Imm, Heme/Lymph   Last edited by Matthew Folks, COA on 03/06/2020  8:12 AM. (History)       ALLERGIES No Known Allergies  PAST MEDICAL HISTORY Past Medical History:  Diagnosis Date  . Arthritis   . Cataract    Mixed OU  . Diabetes mellitus without complication ([facility])   . Diabetic retinopathy ([place])    NPDR OU  . Hyperlipidemia   . Hypertension   . Hypertensive retinopathy    OU  . Obesity    Past Surgical History:  Procedure Laterality Date  . BIOPSY  11/29/2019   Procedure: BIOPSY;  Surgeon: Irving Copas., MD;  Location: [facility];  Service: Gastroenterology;;  .  COLONOSCOPY WITH PROPOFOL N/A 11/29/2019   Procedure: COLONOSCOPY WITH PROPOFOL;  Surgeon: Irving Copas., MD;  Location: [facility];  Service: Gastroenterology;  Laterality: N/A;  . ENDOSCOPIC MUCOSAL RESECTION N/A 11/29/2019   Procedure: ENDOSCOPIC MUCOSAL RESECTION;  Surgeon: Rush Landmark Telford Nab., MD;  Location: [facility];  Service: Gastroenterology;  Laterality: N/A;  . ESOPHAGOGASTRODUODENOSCOPY (EGD) WITH PROPOFOL N/A 11/29/2019   Procedure: ESOPHAGOGASTRODUODENOSCOPY (EGD) WITH PROPOFOL;  Surgeon: Rush Landmark Telford Nab., MD;  Location: [facility];  Service: Gastroenterology;  Laterality: N/A;  . EYE SURGERY Left    had to have eye flushed after getting costic sodium in L eye  . HEMOSTASIS CLIP PLACEMENT  11/29/2019   Procedure: HEMOSTASIS CLIP PLACEMENT;  Surgeon: Irving Copas., MD;  Location: [facility];  Service: Gastroenterology;;  . HERNIA REPAIR    . HERNIA REPAIR     35 years ago  . HOT HEMOSTASIS N/A 11/29/2019   Procedure: HOT HEMOSTASIS (ARGON PLASMA COAGULATION/BICAP);  Surgeon: Irving Copas., MD;  Location: [facility];  Service: Gastroenterology;  Laterality: N/A;  . LEFT HEART CATH AND CORONARY ANGIOGRAPHY N/A 04/11/2019   Procedure: LEFT HEART CATH AND CORONARY ANGIOGRAPHY;  Surgeon: Adrian Prows, MD;  Location: [facility];  Service: Cardiovascular;  Laterality: N/A;  . POLYPECTOMY  11/29/2019   Procedure: POLYPECTOMY;  Surgeon: Irving Copas., MD;  Location: [facility];  Service: Gastroenterology;;  . RENAL ANGIOGRAPHY Bilateral 04/11/2019   Procedure: RENAL ANGIOGRAPHY;  Surgeon: Adrian Prows, MD;  Location: [facility];  Service: Cardiovascular;  Laterality: Bilateral;  . SUBMUCOSAL LIFTING INJECTION  11/29/2019   Procedure: SUBMUCOSAL LIFTING INJECTION;  Surgeon: Irving Copas., MD;  Location: [facility];  Service: Gastroenterology;;  . TOTAL KNEE ARTHROPLASTY Left 08/01/2019   Procedure: LEFT  TOTAL KNEE ARTHROPLASTY;  Surgeon: Meredith Pel, MD;  Location: [facility];  Service: Orthopedics;  Laterality: Left;    FAMILY HISTORY Family History  Problem Relation Age of Onset  . Diabetes Mother   . COPD Mother   . Cancer Mother        unknown type   . Heart disease Father   . COPD Sister   . Healthy Brother   . Heart disease Brother   . Healthy Son   . Colon cancer Neg Hx   . Colon polyps Neg Hx   . Esophageal cancer Neg Hx   . Rectal cancer Neg Hx   . Stomach cancer Neg Hx   . Inflammatory bowel disease Neg Hx   . Liver disease Neg Hx   . Pancreatic cancer Neg Hx     SOCIAL HISTORY Social History   Tobacco Use  . Smoking status: Never Smoker  . Smokeless tobacco: Never Used  Vaping Use  .  Vaping Use: Never used  Substance Use Topics  . Alcohol use: Not Currently  . Drug use: Never         OPHTHALMIC EXAM:  Base Eye Exam    Visual Acuity (Snellen - Linear)      Right Left   Dist [place] 20/30 20/30   Dist ph [place] 20/20 20/20       Tonometry (Tonopen, 8:21 AM)      Right Left   Pressure 16 14       Pupils      Dark Light Shape React APD   Right 3 2 Round Brisk None   Left 3 2 Round Brisk None       Visual Fields (Counting fingers)      Left Right    Full Full       Extraocular Movement      Right Left    Full, Ortho Full, Ortho       Neuro/Psych    Oriented x3: Yes   Mood/Affect: Normal       Dilation    Both eyes: 1.0% Mydriacyl, 2.5% Phenylephrine @ 8:21 AM        Slit Lamp and Fundus Exam    Slit Lamp Exam      Right Left   Lids/Lashes Dermatochalasis - upper lid Dermatochalasis - upper lid   Conjunctiva/Sclera White and quiet White and quiet   Cornea trace Punctate epithelial erosions 1-2+ inferior Punctate epithelial erosions   Anterior Chamber Deep and quiet Deep and quiet   Iris Round and moderately dilated to 6.78m, No NVI Round and moderately dilated to 6.089m No NVI   Lens 2+ Nuclear sclerosis, 2+ Cortical cataract,  +Vacuoles 2+ Nuclear sclerosis, 2+ Cortical cataract, +Vacuoles   Vitreous Vitreous syneresis Vitreous syneresis       Fundus Exam      Right Left   Disc Pink and Sharp, +Peripapillary atrophy Pink and Sharp, +Peripapillary atrophy, Compact   C/D Ratio 0.2 0.1   Macula Flat, Blunted foveal reflex, scattered Microaneurysms greatest SN macula with focal cystic changes / edema -- improving Flat, Blunted foveal reflex, central cystic changes--slightly improved, scattered MA   Vessels attenuated, mild tortuousity attenuated, mild tortuousity   Periphery Attached, pigmented cystoid degeneration ST periphery, rare scattered MA/IRH ST periphery, focal pigmented VR tuft at 0900 Attached, scattered MA / IRH greatest posteriorly, focal pigmented CR scarring along distal IT arcade (0130)             IMAGING AND PROCEDURES  Imaging and Procedures for 03/06/2020  OCT, Retina - OU - Both Eyes       Right Eye Quality was good. Central Foveal Thickness: 344. Progression has improved. Findings include normal foveal contour, intraretinal fluid, no SRF (Interval improvement in IRF -- resolved centrally, mildly persistent SN mac ).   Left Eye Quality was good. Central Foveal Thickness: 331. Progression has been stable. Findings include intraretinal fluid, no SRF, vitreomacular adhesion , normal foveal contour (Trace, persistent cystic changes perifovea).   Notes *Images captured and stored on drive  Diagnosis / Impression:  DME OU OD: Interval improvement in IRF -- resolved centrally, mildly persistent SN mac OS: Trace, persistent cystic changes perifovea  Clinical management:  See below  Abbreviations: NFP - Normal foveal profile. CME - cystoid macular edema. PED - pigment epithelial detachment. IRF - intraretinal fluid. SRF - subretinal fluid. EZ - ellipsoid zone. ERM - epiretinal membrane. ORA - outer retinal atrophy. ORT - outer retinal tubulation.  SRHM - subretinal hyper-reflective material.  IRHM - intraretinal hyper-reflective material        Intravitreal Injection, Pharmacologic Agent - OD - Right Eye       Time Out 03/06/2020. 9:00 AM. Confirmed correct patient, procedure, site, and patient consented.   Anesthesia Topical anesthesia was used. Anesthetic medications included Lidocaine 2%, Proparacaine 0.5%.   Procedure Preparation included 5% betadine to ocular surface, eyelid speculum. A supplied needle was used.   Injection:  1.25 mg Bevacizumab (AVASTIN) 1.24m/0.05mL SOLN   NDC: 582505-397-67 Lot: [id number] , Expiration date: 04/10/2020   Route: Intravitreal, Site: Right Eye, Waste: 0 mL  Post-op Post injection exam found visual acuity of at least counting fingers. The patient tolerated the procedure well. There were no complications. The patient received written and verbal post procedure care education. Post injection medications were not given.                 ASSESSMENT/PLAN:    ICD-10-CM   1. Moderate nonproliferative diabetic retinopathy of both eyes with macular edema associated with type 2 diabetes mellitus (HCC)  EH41.9379Intravitreal Injection, Pharmacologic Agent - OD - Right Eye    Bevacizumab (AVASTIN) SOLN 1.25 mg  2. Retinal edema  H35.81 OCT, Retina - OU - Both Eyes  3. Essential hypertension  I10   4. Hypertensive retinopathy of both eyes  H35.033   5. Combined forms of age-related cataract of both eyes  H25.813     1,2. Moderate Non-proliferative diabetic retinopathy w/ DME OU  - S/P IVA OD #1 (10.26.21), #2 (11.24.21), #3 (12.22.21)             - A1c 8.3% on 12.03.21; 7.4% on 6.4.21 - BCVA 20/20 OU - exam shows scattered MA and mild edema/cystic changes - FA (09.29.21) shows late leaking MA OU; no NV OU  - OCT shows OD: Interval improvement in IRF -- resolved centrally, mildly persistent SN mac; OS: Trace, persistent cystic changes perifovea  - recommend IVA OD #4 today, 03/06/20 for persistent DME OD  - pt wishes to proceed  -  RBA of procedure discussed, questions answered  - informed consent obtained and signed  - see procedure note - f/u in 6-8 wks -- DFE/OCT, possible injection vs focal laser  3,4. Hypertensive retinopathy OU - discussed importance of tight BP control - monitor  5. Mixed Cataract OU - The symptoms of cataract, surgical options, and treatments and risks were discussed with patient. - discussed diagnosis and progression - not yet visually significant - monitor for now   Ophthalmic Meds Ordered this visit:  Meds ordered this encounter  Medications  . Bevacizumab (AVASTIN) SOLN 1.25 mg       Return for f/u 6-8 weeks, NPDR OU, DFE, OCT, Possible Injxn vs focal laser.  There are no Patient Instructions on file for this visit.  This document serves as a record of services personally performed by BGardiner Sleeper MD, PhD. It was created on their behalf by DRoselee Nova COMT. The creation of this record is the provider's dictation and/or activities during the visit.  Electronically signed by: DRoselee Nova COMT 03/06/20 9:21 AM   This document serves as a record of services personally performed by BGardiner Sleeper MD, PhD. It was created on their behalf by ASan Jetty BOwens Shark OA an ophthalmic technician. The creation of this record is the provider's dictation and/or activities during the visit.    Electronically signed by: ASan Jetty BOwens Shark OA 02.02.2022 9:21 AM  BSharyon Cable  Coralyn Pear, M.D., Ph.D. Diseases & Surgery of the Retina and [place]  I have reviewed the above documentation for accuracy and completeness, and I agree with the above. Gardiner Sleeper, M.D., Ph.D. 03/06/20 9:21 AM   Abbreviations: M myopia (nearsighted); A astigmatism; H hyperopia (farsighted); P presbyopia; Mrx spectacle prescription;  CTL contact lenses; OD right eye; OS left eye; OU both eyes  XT exotropia; ET esotropia; PEK punctate epithelial keratitis; PEE punctate epithelial  erosions; DES dry eye syndrome; MGD meibomian gland dysfunction; ATs artificial tears; PFAT's preservative free artificial tears; [place] nuclear sclerotic cataract; PSC posterior subcapsular cataract; ERM epi-retinal membrane; PVD posterior vitreous detachment; RD retinal detachment; DM diabetes mellitus; DR diabetic retinopathy; NPDR non-proliferative diabetic retinopathy; PDR proliferative diabetic retinopathy; CSME clinically significant macular edema; DME diabetic macular edema; dbh dot blot hemorrhages; CWS cotton wool spot; POAG primary open angle glaucoma; C/D cup-to-disc ratio; HVF humphrey visual field; GVF goldmann visual field; OCT optical coherence tomography; IOP intraocular pressure; BRVO Branch retinal vein occlusion; CRVO central retinal vein occlusion; CRAO central retinal artery occlusion; BRAO branch retinal artery occlusion; RT retinal tear; SB scleral buckle; PPV pars plana vitrectomy; VH Vitreous hemorrhage; PRP panretinal laser photocoagulation; IVK intravitreal kenalog; VMT vitreomacular traction; MH Macular hole;  NVD neovascularization of the disc; NVE neovascularization elsewhere; AREDS age related eye disease study; ARMD age related macular degeneration; POAG primary open angle glaucoma; EBMD epithelial/anterior basement membrane dystrophy; ACIOL anterior chamber intraocular lens; IOL intraocular lens; PCIOL posterior chamber intraocular lens; Phaco/IOL phacoemulsification with intraocular lens placement; [place] photorefractive keratectomy; LASIK laser assisted in situ keratomileusis; HTN hypertension; DM diabetes mellitus; COPD chronic obstructive pulmonary disease

## 2020-03-06 ENCOUNTER — Encounter (INDEPENDENT_AMBULATORY_CARE_PROVIDER_SITE_OTHER): Payer: Self-pay | Admitting: Ophthalmology

## 2020-03-06 ENCOUNTER — Ambulatory Visit (INDEPENDENT_AMBULATORY_CARE_PROVIDER_SITE_OTHER): Payer: 59 | Admitting: Ophthalmology

## 2020-03-06 ENCOUNTER — Other Ambulatory Visit: Payer: Self-pay

## 2020-03-06 ENCOUNTER — Other Ambulatory Visit: Payer: Self-pay | Admitting: Cardiology

## 2020-03-06 DIAGNOSIS — H25813 Combined forms of age-related cataract, bilateral: Secondary | ICD-10-CM

## 2020-03-06 DIAGNOSIS — I10 Essential (primary) hypertension: Secondary | ICD-10-CM

## 2020-03-06 DIAGNOSIS — E113313 Type 2 diabetes mellitus with moderate nonproliferative diabetic retinopathy with macular edema, bilateral: Secondary | ICD-10-CM

## 2020-03-06 DIAGNOSIS — H35033 Hypertensive retinopathy, bilateral: Secondary | ICD-10-CM | POA: Diagnosis not present

## 2020-03-06 DIAGNOSIS — E782 Mixed hyperlipidemia: Secondary | ICD-10-CM

## 2020-03-06 DIAGNOSIS — H3581 Retinal edema: Secondary | ICD-10-CM

## 2020-03-06 MED ORDER — BEVACIZUMAB CHEMO INJECTION 1.25MG/0.05ML SYRINGE FOR KALEIDOSCOPE
1.2500 mg | INTRAVITREAL | Status: AC | PRN
  Administered 2020-03-06: 1.25 mg via INTRAVITREAL

## 2020-03-13 ENCOUNTER — Other Ambulatory Visit: Payer: Self-pay | Admitting: Cardiology

## 2020-03-13 DIAGNOSIS — I10 Essential (primary) hypertension: Secondary | ICD-10-CM

## 2020-03-15 ENCOUNTER — Other Ambulatory Visit: Payer: Self-pay

## 2020-03-15 ENCOUNTER — Other Ambulatory Visit: Payer: Self-pay | Admitting: Gastroenterology

## 2020-03-16 LAB — BASIC METABOLIC PANEL
BUN/Creatinine Ratio: 23 (ref 10–24)
BUN: 27 mg/dL (ref 8–27)
CO2: 21 mmol/L (ref 20–29)
Calcium: 9.7 mg/dL (ref 8.6–10.2)
Chloride: 101 mmol/L (ref 96–106)
Creatinine, Ser: 1.17 mg/dL (ref 0.76–1.27)
GFR calc Af Amer: 75 mL/min/{1.73_m2} (ref >59)
GFR calc non Af Amer: 65 mL/min/{1.73_m2} (ref >59)
Glucose: 213 mg/dL — ABNORMAL HIGH (ref 65–99)
Potassium: 4.9 mmol/L (ref 3.5–5.2)
Sodium: 141 mmol/L (ref 134–144)

## 2020-03-16 LAB — MAGNESIUM: Magnesium: 1.5 mg/dL — ABNORMAL LOW (ref 1.6–2.3)

## 2020-03-18 ENCOUNTER — Other Ambulatory Visit: Payer: Self-pay

## 2020-03-18 DIAGNOSIS — I10 Essential (primary) hypertension: Secondary | ICD-10-CM

## 2020-03-18 MED ORDER — MAGNESIUM OXIDE 400 MG PO CAPS: 400.0000 mg | ORAL_CAPSULE | Freq: Four times a day (QID) | ORAL | 2 refills | Status: AC

## 2020-03-18 NOTE — Progress Notes (Signed)
Called pt to inform him about his lab results and to start mag 400 four times daily and to repeat labs in one week.

## 2020-03-21 ENCOUNTER — Other Ambulatory Visit: Payer: Self-pay | Admitting: Surgical

## 2020-03-21 NOTE — Telephone Encounter (Signed)
Pls advise.  

## 2020-04-14 ENCOUNTER — Other Ambulatory Visit: Payer: Self-pay | Admitting: Cardiology

## 2020-04-14 DIAGNOSIS — I10 Essential (primary) hypertension: Secondary | ICD-10-CM

## 2020-04-22 NOTE — Progress Notes (Addendum)
[facility] Note  04/24/2020     CHIEF COMPLAINT Patient presents for Retina Follow Up   HISTORY OF PRESENT ILLNESS: Joe Stewart is a 65 y.o. male who presents to the clinic today for:  HPI    Retina Follow Up    Patient presents with  Diabetic Retinopathy.  In both eyes.  This started months ago.  Severity is moderate.  Duration of 7 weeks.  Since onset it is stable.  I, the attending physician,  performed the HPI with the patient and updated documentation appropriately.          Comments    65 y/o male pt here for 7 wk f/u for mod NPDR w/DME OU.  No change in [place] OU.  Denies pain, FOL, floaters.  No gtts.  BS 138 6 days ago.  A1C unknown.       Last edited by Bernarda Caffey, MD on 04/24/2020  9:42 AM. (History)    Pt   Referring physician: Shirleen Schirmer, [address] [place], [place] [address]   HISTORICAL INFORMATION:   Selected notes from the MEDICAL RECORD NUMBER Referred by [name], PA LEE: 09.15.21 BCVA OD: 20/20 OS: 20/20 Ocular Hx- NPDR with edema  PMH- DM, HTN   CURRENT MEDICATIONS: No current outpatient medications on file. (Ophthalmic Drugs)   No current facility-administered medications for this visit. (Ophthalmic Drugs)   Current Outpatient Medications (Other)  Medication Sig  . amLODipine (NORVASC) 10 MG tablet TAKE 1 TABLET (10 MG TOTAL) BY MOUTH IN THE MORNING.  Marland Kitchen atorvastatin (LIPITOR) 40 MG tablet TAKE 1 TABLET BY MOUTH EVERY DAY  . [facility] ASPIRIN ADULT LOW DOSE 81 MG chewable tablet CHEW 1 TABLET (81 MG TOTAL) BY MOUTH DAILY.  Marland Kitchen Cyanocobalamin (VITAMIN B-12 PO) Take 1 tablet by mouth daily.  . Ferrous Sulfate Dried (HIGH POTENCY IRON) 65 MG TABS Take by mouth.  . hydrochlorothiazide (HYDRODIURIL) 25 MG tablet TAKE 1 TABLET (25 MG TOTAL) BY MOUTH IN THE MORNING.  Marland Kitchen losartan (COZAAR) 25 MG tablet Take 25 mg by mouth daily.  Marland Kitchen losartan (COZAAR) 50 MG tablet Take 1 tablet (50 mg total) by mouth every  evening.  . magnesium oxide (MAG-OX) 400 MG tablet Take 1 tablet by mouth 2 (two) times daily.  . Magnesium Oxide 400 MG CAPS Take 1 capsule (400 mg total) by mouth in the morning, at noon, in the evening, and at bedtime.  . metFORMIN (GLUCOPHAGE) 1000 MG tablet Take 1,000 mg by mouth 2 (two) times daily.  . metoprolol succinate (TOPROL-XL) 25 MG 24 hr tablet TAKE 1 TABLET (25 MG TOTAL) BY MOUTH DAILY. TAKE WITH OR IMMEDIATELY FOLLOWING A MEAL. HOLD IF SYSTOLIC BLOOD PRESSURE (TOP BLOOD PRESSURE NUMBER) LESS THAN 100 MMHG OR HEART RATE LESS THAN 60 BPM (PULSE).  Marland Kitchen omeprazole (PRILOSEC) 40 MG capsule TAKE 1 CAPSULE (40 MG TOTAL) BY MOUTH 2 (TWO) TIMES DAILY BEFORE A MEAL.  Marland Kitchen sitaGLIPtin-metformin (JANUMET) 50-1000 MG tablet Take 1 tablet by mouth 2 (two) times daily with a meal.   No current facility-administered medications for this visit. (Other)      REVIEW OF SYSTEMS: ROS    Positive for: Gastrointestinal, Musculoskeletal, Endocrine, Eyes   Negative for: Constitutional, Neurological, Skin, Genitourinary, HENT, Cardiovascular, Respiratory, Psychiatric, Allergic/Imm, Heme/Lymph   Last edited by Matthew Folks, COA on 04/24/2020  8:54 AM. (History)       ALLERGIES No Known Allergies  PAST MEDICAL HISTORY Past  Medical History:  Diagnosis Date  . Arthritis   . Cataract    Mixed OU  . Diabetes mellitus without complication ([place])   . Diabetic retinopathy ([place])    NPDR OU  . Hyperlipidemia   . Hypertension   . Hypertensive retinopathy    OU  . Obesity    Past Surgical History:  Procedure Laterality Date  . BIOPSY  11/29/2019   Procedure: BIOPSY;  Surgeon: Irving Copas., MD;  Location: [facility];  Service: Gastroenterology;;  . COLONOSCOPY WITH PROPOFOL N/A 11/29/2019   Procedure: COLONOSCOPY WITH PROPOFOL;  Surgeon: Irving Copas., MD;  Location: [facility];  Service: Gastroenterology;  Laterality: N/A;  . ENDOSCOPIC MUCOSAL RESECTION N/A 11/29/2019    Procedure: ENDOSCOPIC MUCOSAL RESECTION;  Surgeon: Rush Landmark Telford Nab., MD;  Location: [facility];  Service: Gastroenterology;  Laterality: N/A;  . ESOPHAGOGASTRODUODENOSCOPY (EGD) WITH PROPOFOL N/A 11/29/2019   Procedure: ESOPHAGOGASTRODUODENOSCOPY (EGD) WITH PROPOFOL;  Surgeon: Rush Landmark Telford Nab., MD;  Location: [facility];  Service: Gastroenterology;  Laterality: N/A;  . EYE SURGERY Left    had to have eye flushed after getting costic sodium in L eye  . HEMOSTASIS CLIP PLACEMENT  11/29/2019   Procedure: HEMOSTASIS CLIP PLACEMENT;  Surgeon: Irving Copas., MD;  Location: [facility];  Service: Gastroenterology;;  . HERNIA REPAIR    . HERNIA REPAIR     35 years ago  . HOT HEMOSTASIS N/A 11/29/2019   Procedure: HOT HEMOSTASIS (ARGON PLASMA COAGULATION/BICAP);  Surgeon: Irving Copas., MD;  Location: [facility];  Service: Gastroenterology;  Laterality: N/A;  . LEFT HEART CATH AND CORONARY ANGIOGRAPHY N/A 04/11/2019   Procedure: LEFT HEART CATH AND CORONARY ANGIOGRAPHY;  Surgeon: Adrian Prows, MD;  Location: [facility];  Service: Cardiovascular;  Laterality: N/A;  . POLYPECTOMY  11/29/2019   Procedure: POLYPECTOMY;  Surgeon: Irving Copas., MD;  Location: [facility];  Service: Gastroenterology;;  . RENAL ANGIOGRAPHY Bilateral 04/11/2019   Procedure: RENAL ANGIOGRAPHY;  Surgeon: Adrian Prows, MD;  Location: [facility];  Service: Cardiovascular;  Laterality: Bilateral;  . SUBMUCOSAL LIFTING INJECTION  11/29/2019   Procedure: SUBMUCOSAL LIFTING INJECTION;  Surgeon: Irving Copas., MD;  Location: [facility];  Service: Gastroenterology;;  . TOTAL KNEE ARTHROPLASTY Left 08/01/2019   Procedure: LEFT TOTAL KNEE ARTHROPLASTY;  Surgeon: Meredith Pel, MD;  Location: [facility];  Service: Orthopedics;  Laterality: Left;    FAMILY HISTORY Family History  Problem Relation Age of Onset  . Diabetes Mother   . COPD Mother   . Cancer Mother         unknown type   . Heart disease Father   . COPD Sister   . Healthy Brother   . Heart disease Brother   . Healthy Son   . Colon cancer Neg Hx   . Colon polyps Neg Hx   . Esophageal cancer Neg Hx   . Rectal cancer Neg Hx   . Stomach cancer Neg Hx   . Inflammatory bowel disease Neg Hx   . Liver disease Neg Hx   . Pancreatic cancer Neg Hx     SOCIAL HISTORY Social History   Tobacco Use  . Smoking status: Never Smoker  . Smokeless tobacco: Never Used  Vaping Use  . Vaping Use: Never used  Substance Use Topics  . Alcohol use: Not Currently  . Drug use: Never         OPHTHALMIC EXAM:  Base Eye Exam    Visual Acuity (Snellen -  Linear)      Right Left   Dist [place] 20/20 - 20/20       Tonometry (Tonopen, 8:59 AM)      Right Left   Pressure 18 17       Pupils      Dark Light Shape React APD   Right 3 2 Round Brisk None   Left 3 2 Round Brisk None       Visual Fields (Counting fingers)      Left Right    Full Full       Extraocular Movement      Right Left    Full, Ortho Full, Ortho       Neuro/Psych    Oriented x3: Yes   Mood/Affect: Normal       Dilation    Both eyes: 1.0% Mydriacyl, 2.5% Phenylephrine @ 8:59 AM        Slit Lamp and Fundus Exam    Slit Lamp Exam      Right Left   Lids/Lashes Dermatochalasis - upper lid Dermatochalasis - upper lid   Conjunctiva/Sclera White and quiet White and quiet   Cornea trace Punctate epithelial erosions 1-2+ inferior Punctate epithelial erosions   Anterior Chamber Deep and quiet Deep and quiet   Iris Round and moderately dilated to 6.42mm, No NVI Round and moderately dilated to 6.62mm, No NVI   Lens 2-3+ Nuclear sclerosis, 2-3+ Cortical cataract, +Vacuoles 2+ Nuclear sclerosis, 2-3+ Cortical cataract, +Vacuoles   Vitreous Vitreous syneresis Vitreous syneresis       Fundus Exam      Right Left   Disc Pink and Sharp, +Peripapillary atrophy Pink and Sharp, +Peripapillary atrophy, Compact   C/D Ratio 0.2  0.1   Macula Flat, Blunted foveal reflex, scattered Microaneurysms greatest SN macula with focal cystic changes / edema -- improving, no great targets for focal laser centrally, non-central okay Flat, Blunted foveal reflex, central cystic changes -- slightly increased, scattered MA   Vessels attenuated, Tortuous attenuated, mild tortuousity   Periphery Attached, pigmented cystoid degeneration ST periphery, rare scattered MA/IRH ST periphery, focal pigmented VR tuft at 0900 Attached, scattered MA / IRH greatest posteriorly, focal pigmented CR scarring along distal IT arcade (0130)             IMAGING AND PROCEDURES  Imaging and Procedures for 04/24/2020  OCT, Retina - OU - Both Eyes       Right Eye Quality was good. Central Foveal Thickness: 344. Progression has been stable. Findings include normal foveal contour, intraretinal fluid, no SRF (Persistent cystic changes / IRF, peripheral schisis caught on widefield).   Left Eye Quality was good. Central Foveal Thickness: 338. Progression has worsened. Findings include intraretinal fluid, no SRF, vitreomacular adhesion , normal foveal contour (Mild interval increase in IRF).   Notes *Images captured and stored on drive  Diagnosis / Impression:  DME OU OD: Persistent cystic changes / IRF OS: Mild interval increase in IRF  Clinical management:  See below  Abbreviations: NFP - Normal foveal profile. CME - cystoid macular edema. PED - pigment epithelial detachment. IRF - intraretinal fluid. SRF - subretinal fluid. EZ - ellipsoid zone. ERM - epiretinal membrane. ORA - outer retinal atrophy. ORT - outer retinal tubulation. SRHM - subretinal hyper-reflective material. IRHM - intraretinal hyper-reflective material        Intravitreal Injection, Pharmacologic Agent - OD - Right Eye       Time Out 04/24/2020. 9:15 AM. Confirmed correct patient, procedure, site, and patient  consented.   Anesthesia Topical anesthesia was used. Anesthetic  medications included Lidocaine 2%, Proparacaine 0.5%.   Procedure Preparation included 5% betadine to ocular surface, eyelid speculum. A supplied needle was used.   Injection:  1.25 mg Bevacizumab (AVASTIN) 1.25mg /0.27mL SOLN   NDC: H061816, Lot: [id number], Expiration date: 07/02/2020   Route: Intravitreal, Site: Right Eye, Waste: 0 mL  Post-op Post injection exam found visual acuity of at least counting fingers. The patient tolerated the procedure well. There were no complications. The patient received written and verbal post procedure care education. Post injection medications were not given.        Intravitreal Injection, Pharmacologic Agent - OS - Left Eye       Time Out 04/24/2020. 9:30 AM. Confirmed correct patient, procedure, site, and patient consented.   Anesthesia Topical anesthesia was used. Anesthetic medications included Lidocaine 2%, Proparacaine 0.5%.   Procedure Preparation included 5% betadine to ocular surface, eyelid speculum. A (32g) needle was used.   Injection:  1.25 mg Bevacizumab (AVASTIN) 1.25mg /0.36mL SOLN   NDC: 79024-097-35, Lot: [id number], Expiration date: 06/11/2020   Route: Intravitreal, Site: Left Eye, Waste: 0.05 mL  Post-op Post injection exam found visual acuity of at least counting fingers. The patient tolerated the procedure well. There were no complications. The patient received written and verbal post procedure care education.                 ASSESSMENT/PLAN:    ICD-10-CM   1. Moderate nonproliferative diabetic retinopathy of both eyes with macular edema associated with type 2 diabetes mellitus (HCC)  A83.4196 Intravitreal Injection, Pharmacologic Agent - OD - Right Eye    Intravitreal Injection, Pharmacologic Agent - OS - Left Eye    Bevacizumab (AVASTIN) SOLN 1.25 mg    Bevacizumab (AVASTIN) SOLN 1.25 mg  2. Retinal edema  H35.81 OCT, Retina - OU - Both Eyes  3. Essential hypertension  I10   4. Hypertensive retinopathy of  both eyes  H35.033   5. Combined forms of age-related cataract of both eyes  H25.813     1,2. Moderate Non-proliferative diabetic retinopathy w/ DME OU  - S/P IVA OD #1 (10.26.21), #2 (11.24.21), #3 (12.22.21), #4 (02/02/222)             - A1c 8.3% on 12.03.21; 7.4% on 06.04.21 - BCVA 20/20 OU - exam shows scattered MA and mild edema/cystic changes - FA (09.29.21) shows late leaking MA OU; no NV OU  - OCT shows OD: Persistent cystic changes / IRF; OS: Mild interval increase in IRF  - recommend IVA OU (OD #5 and OS #1) today, 04/24/20 for DME OU  - pt wishes to proceed  - RBA of procedure discussed, questions answered  - informed consent obtained and signed  - see procedure note - f/u in 6 wks -- DFE/OCT, possible injection vs focal laser  3,4. Hypertensive retinopathy OU - discussed importance of tight BP control - monitor  5. Mixed Cataract OU - The symptoms of cataract, surgical options, and treatments and risks were discussed with patient. - discussed diagnosis and progression - not yet visually significant - monitor for now   Ophthalmic Meds Ordered this visit:  Meds ordered this encounter  Medications  . Bevacizumab (AVASTIN) SOLN 1.25 mg  . Bevacizumab (AVASTIN) SOLN 1.25 mg       Return in about 6 weeks (around 06/05/2020) for f/u NPDR OU, DFE, OCT.  There are no Patient Instructions on file for this visit.  This document serves  as a record of services personally performed by Gardiner Sleeper, MD, PhD. It was created on their behalf by Roselee Nova, COMT. The creation of this record is the provider's dictation and/or activities during the visit.  Electronically signed by: Roselee Nova, COMT 04/24/20 12:25 PM   This document serves as a record of services personally performed by Gardiner Sleeper, MD, PhD. It was created on their behalf by San Jetty. Owens Shark, OA an ophthalmic technician. The creation of this record is the provider's dictation and/or activities during the  visit.    Electronically signed by: San Jetty. Owens Shark, [place] 03.23.2022 12:25 PM  Gardiner Sleeper, M.D., Ph.D. Diseases & Surgery of the Retina and Vitreous [facility]  I have reviewed the above documentation for accuracy and completeness, and I agree with the above. Gardiner Sleeper, M.D., Ph.D. 04/24/20 12:25 PM   Abbreviations: M myopia (nearsighted); A astigmatism; H hyperopia (farsighted); P presbyopia; Mrx spectacle prescription;  CTL contact lenses; OD right eye; OS left eye; OU both eyes  XT exotropia; ET esotropia; PEK punctate epithelial keratitis; PEE punctate epithelial erosions; DES dry eye syndrome; MGD meibomian gland dysfunction; ATs artificial tears; PFAT's preservative free artificial tears; [place] nuclear sclerotic cataract; PSC posterior subcapsular cataract; ERM epi-retinal membrane; PVD posterior vitreous detachment; RD retinal detachment; DM diabetes mellitus; DR diabetic retinopathy; NPDR non-proliferative diabetic retinopathy; PDR proliferative diabetic retinopathy; CSME clinically significant macular edema; DME diabetic macular edema; dbh dot blot hemorrhages; CWS cotton wool spot; POAG primary open angle glaucoma; C/D cup-to-disc ratio; HVF humphrey visual field; GVF goldmann visual field; OCT optical coherence tomography; IOP intraocular pressure; BRVO Branch retinal vein occlusion; CRVO central retinal vein occlusion; CRAO central retinal artery occlusion; BRAO branch retinal artery occlusion; RT retinal tear; SB scleral buckle; PPV pars plana vitrectomy; VH Vitreous hemorrhage; PRP panretinal laser photocoagulation; IVK intravitreal kenalog; VMT vitreomacular traction; MH Macular hole;  NVD neovascularization of the disc; NVE neovascularization elsewhere; AREDS age related eye disease study; ARMD age related macular degeneration; POAG primary open angle glaucoma; EBMD epithelial/anterior basement membrane dystrophy; ACIOL anterior chamber intraocular lens; IOL  intraocular lens; PCIOL posterior chamber intraocular lens; Phaco/IOL phacoemulsification with intraocular lens placement; [place] photorefractive keratectomy; LASIK laser assisted in situ keratomileusis; HTN hypertension; DM diabetes mellitus; COPD chronic obstructive pulmonary disease

## 2020-04-24 ENCOUNTER — Encounter (INDEPENDENT_AMBULATORY_CARE_PROVIDER_SITE_OTHER): Payer: Self-pay | Admitting: Ophthalmology

## 2020-04-24 ENCOUNTER — Other Ambulatory Visit: Payer: Self-pay

## 2020-04-24 ENCOUNTER — Ambulatory Visit (INDEPENDENT_AMBULATORY_CARE_PROVIDER_SITE_OTHER): Payer: 59 | Admitting: Ophthalmology

## 2020-04-24 DIAGNOSIS — I10 Essential (primary) hypertension: Secondary | ICD-10-CM | POA: Diagnosis not present

## 2020-04-24 DIAGNOSIS — H35033 Hypertensive retinopathy, bilateral: Secondary | ICD-10-CM | POA: Diagnosis not present

## 2020-04-24 DIAGNOSIS — H25813 Combined forms of age-related cataract, bilateral: Secondary | ICD-10-CM

## 2020-04-24 DIAGNOSIS — E113313 Type 2 diabetes mellitus with moderate nonproliferative diabetic retinopathy with macular edema, bilateral: Secondary | ICD-10-CM | POA: Diagnosis not present

## 2020-04-24 DIAGNOSIS — H3581 Retinal edema: Secondary | ICD-10-CM | POA: Diagnosis not present

## 2020-04-24 MED ORDER — BEVACIZUMAB CHEMO INJECTION 1.25MG/0.05ML SYRINGE FOR KALEIDOSCOPE
1.2500 mg | INTRAVITREAL | Status: AC | PRN
  Administered 2020-04-24: 1.25 mg via INTRAVITREAL

## 2020-04-29 ENCOUNTER — Ambulatory Visit: Payer: 59 | Admitting: Cardiology

## 2020-05-19 ENCOUNTER — Other Ambulatory Visit: Payer: Self-pay | Admitting: Cardiology

## 2020-05-19 DIAGNOSIS — I10 Essential (primary) hypertension: Secondary | ICD-10-CM

## 2020-06-03 NOTE — Progress Notes (Shared)
[facility] Note  06/05/2020     CHIEF COMPLAINT Patient presents for No chief complaint on file.   HISTORY OF PRESENT ILLNESS: Joe Stewart is a 65 y.o. male who presents to the clinic today for:  Pt   Referring physician: Shirleen Schirmer, [address], [place] [address]   HISTORICAL INFORMATION:   Selected notes from the MEDICAL RECORD NUMBER Referred by [name], PA LEE: 09.15.21 BCVA OD: 20/20 OS: 20/20 Ocular Hx- NPDR with edema  PMH- DM, HTN   CURRENT MEDICATIONS: No current outpatient medications on file. (Ophthalmic Drugs)   No current facility-administered medications for this visit. (Ophthalmic Drugs)   Current Outpatient Medications (Other)  Medication Sig  . amLODipine (NORVASC) 10 MG tablet TAKE 1 TABLET (10 MG TOTAL) BY MOUTH IN THE MORNING.  Marland Kitchen atorvastatin (LIPITOR) 40 MG tablet TAKE 1 TABLET BY MOUTH EVERY DAY  . [facility] ASPIRIN ADULT LOW DOSE 81 MG chewable tablet CHEW 1 TABLET (81 MG TOTAL) BY MOUTH DAILY.  Marland Kitchen Cyanocobalamin (VITAMIN B-12 PO) Take 1 tablet by mouth daily.  . Ferrous Sulfate Dried (HIGH POTENCY IRON) 65 MG TABS Take by mouth.  . hydrochlorothiazide (HYDRODIURIL) 25 MG tablet TAKE 1 TABLET (25 MG TOTAL) BY MOUTH IN THE MORNING.  Marland Kitchen losartan (COZAAR) 25 MG tablet Take 25 mg by mouth daily.  Marland Kitchen losartan (COZAAR) 50 MG tablet TAKE 1 TABLET BY MOUTH EVERY EVENING.  . magnesium oxide (MAG-OX) 400 MG tablet Take 1 tablet by mouth 2 (two) times daily.  . Magnesium Oxide 400 MG CAPS Take 1 capsule (400 mg total) by mouth in the morning, at noon, in the evening, and at bedtime.  . metFORMIN (GLUCOPHAGE) 1000 MG tablet Take 1,000 mg by mouth 2 (two) times daily.  . metoprolol succinate (TOPROL-XL) 25 MG 24 hr tablet TAKE 1 TABLET (25 MG TOTAL) BY MOUTH DAILY. TAKE WITH OR IMMEDIATELY FOLLOWING A MEAL. HOLD IF SYSTOLIC BLOOD PRESSURE (TOP BLOOD PRESSURE NUMBER) LESS THAN 100 MMHG OR HEART RATE LESS THAN 60  BPM (PULSE).  Marland Kitchen omeprazole (PRILOSEC) 40 MG capsule TAKE 1 CAPSULE (40 MG TOTAL) BY MOUTH 2 (TWO) TIMES DAILY BEFORE A MEAL.  Marland Kitchen sitaGLIPtin-metformin (JANUMET) 50-1000 MG tablet Take 1 tablet by mouth 2 (two) times daily with a meal.   No current facility-administered medications for this visit. (Other)      REVIEW OF SYSTEMS:    ALLERGIES No Known Allergies  PAST MEDICAL HISTORY Past Medical History:  Diagnosis Date  . Arthritis   . Cataract    Mixed OU  . Diabetes mellitus without complication ([place])   . Diabetic retinopathy ([place])    NPDR OU  . Hyperlipidemia   . Hypertension   . Hypertensive retinopathy    OU  . Obesity    Past Surgical History:  Procedure Laterality Date  . BIOPSY  11/29/2019   Procedure: BIOPSY;  Surgeon: Irving Copas., MD;  Location: [facility];  Service: Gastroenterology;;  . COLONOSCOPY WITH PROPOFOL N/A 11/29/2019   Procedure: COLONOSCOPY WITH PROPOFOL;  Surgeon: Irving Copas., MD;  [facility];  Service: Gastroenterology;  Laterality: N/A;  . ENDOSCOPIC MUCOSAL RESECTION N/A 11/29/2019   Procedure: ENDOSCOPIC MUCOSAL RESECTION;  Surgeon: Rush Landmark Telford Nab., MD;  [facility]: [facility];  Service: Gastroenterology;  Laterality: N/A;  . ESOPHAGOGASTRODUODENOSCOPY (EGD) WITH PROPOFOL N/A 11/29/2019   Procedure: ESOPHAGOGASTRODUODENOSCOPY (EGD) WITH PROPOFOL;  Surgeon: Rush Landmark Telford Nab., MD;  [facility];  Service: Gastroenterology;  Laterality: N/A;  . EYE SURGERY Left    had to have eye flushed after getting costic sodium in L eye  . HEMOSTASIS CLIP PLACEMENT  11/29/2019   Procedure: HEMOSTASIS CLIP PLACEMENT;  Surgeon: Irving Copas., MD;  [facility];  Service: Gastroenterology;;  . HERNIA REPAIR    . HERNIA REPAIR     35 years ago  . HOT HEMOSTASIS N/A 11/29/2019   Procedure: HOT HEMOSTASIS (ARGON PLASMA COAGULATION/BICAP);  Surgeon: Irving Copas., MD;   Location: [facility];  Service: Gastroenterology;  Laterality: N/A;  . LEFT HEART CATH AND CORONARY ANGIOGRAPHY N/A 04/11/2019   Procedure: LEFT HEART CATH AND CORONARY ANGIOGRAPHY;  Surgeon: Adrian Prows, MD;  Location: [facility];  Service: Cardiovascular;  Laterality: N/A;  . POLYPECTOMY  11/29/2019   Procedure: POLYPECTOMY;  Surgeon: Irving Copas., MD;  Location: [facility];  Service: Gastroenterology;;  . RENAL ANGIOGRAPHY Bilateral 04/11/2019   Procedure: RENAL ANGIOGRAPHY;  Surgeon: Adrian Prows, MD;  Location: [facility];  Service: Cardiovascular;  Laterality: Bilateral;  . SUBMUCOSAL LIFTING INJECTION  11/29/2019   Procedure: SUBMUCOSAL LIFTING INJECTION;  Surgeon: Irving Copas., MD;  Location: [facility];  Service: Gastroenterology;;  . TOTAL KNEE ARTHROPLASTY Left 08/01/2019   Procedure: LEFT TOTAL KNEE ARTHROPLASTY;  Surgeon: Meredith Pel, MD;  Location: [facility];  Service: Orthopedics;  Laterality: Left;    FAMILY HISTORY Family History  Problem Relation Age of Onset  . Diabetes Mother   . COPD Mother   . Cancer Mother        unknown type   . Heart disease Father   . COPD Sister   . Healthy Brother   . Heart disease Brother   . Healthy Son   . Colon cancer Neg Hx   . Colon polyps Neg Hx   . Esophageal cancer Neg Hx   . Rectal cancer Neg Hx   . Stomach cancer Neg Hx   . Inflammatory bowel disease Neg Hx   . Liver disease Neg Hx   . Pancreatic cancer Neg Hx     SOCIAL HISTORY Social History   Tobacco Use  . Smoking status: Never Smoker  . Smokeless tobacco: Never Used  Vaping Use  . Vaping Use: Never used  Substance Use Topics  . Alcohol use: Not Currently  . Drug use: Never         OPHTHALMIC EXAM:  Not recorded     IMAGING AND PROCEDURES  Imaging and Procedures for 06/05/2020           ASSESSMENT/PLAN:    ICD-10-CM   1. Moderate nonproliferative diabetic retinopathy of both eyes with macular edema  associated with type 2 diabetes mellitus ([place])  X54.0086   2. Retinal edema  H35.81   3. Essential hypertension  I10   4. Hypertensive retinopathy of both eyes  H35.033   5. Combined forms of age-related cataract of both eyes  H25.813     1,2. Moderate Non-proliferative diabetic retinopathy w/ DME OU  - S/P IVA OD #1 (10.26.21), #2 (11.24.21), #3 (12.22.21), #4 (02/02/222), #5 (03.23.22)  - S/P IVA OS #1 (03.23.22)             - A1c 8.3% on 12.03.21; 7.4% on 06.04.21 - BCVA 20/20 OU - exam shows scattered MA and mild edema/cystic changes - FA (09.29.21) shows late leaking MA OU; no NV OU  - OCT shows OD: Persistent cystic changes / IRF; OS: Mild  interval increase in IRF  - recommend IVA OU (OD #6 and OS #2) today, 06/05/20 for DME OU  - pt wishes to proceed  - RBA of procedure discussed, questions answered  - informed consent obtained and signed  - see procedure note - f/u in 6 wks -- DFE/OCT, possible injection vs focal laser  3,4. Hypertensive retinopathy OU - discussed importance of tight BP control - monitor  5. Mixed Cataract OU - The symptoms of cataract, surgical options, and treatments and risks were discussed with patient. - discussed diagnosis and progression - not yet visually significant - monitor for now   Ophthalmic Meds Ordered this visit:  No orders of the defined types were placed in this encounter.      No follow-ups on file.  There are no Patient Instructions on file for this visit.  This document serves as a record of services personally performed by Gardiner Sleeper, MD, PhD. It was created on their behalf by Roselee Nova, COMT. The creation of this record is the provider's dictation and/or activities during the visit.  Electronically signed by: Roselee Nova, COMT 06/03/20 10:17 AM    Gardiner Sleeper, M.D., Ph.D. Diseases & Surgery of the Retina and Vitreous [facility]: M myopia (nearsighted); A  astigmatism; H hyperopia (farsighted); P presbyopia; Mrx spectacle prescription;  CTL contact lenses; OD right eye; OS left eye; OU both eyes  XT exotropia; ET esotropia; PEK punctate epithelial keratitis; PEE punctate epithelial erosions; DES dry eye syndrome; MGD meibomian gland dysfunction; ATs artificial tears; PFAT's preservative free artificial tears; Branch nuclear sclerotic cataract; PSC posterior subcapsular cataract; ERM epi-retinal membrane; PVD posterior vitreous detachment; RD retinal detachment; DM diabetes mellitus; DR diabetic retinopathy; NPDR non-proliferative diabetic retinopathy; PDR proliferative diabetic retinopathy; CSME clinically significant macular edema; DME diabetic macular edema; dbh dot blot hemorrhages; CWS cotton wool spot; POAG primary open angle glaucoma; C/D cup-to-disc ratio; HVF humphrey visual field; GVF goldmann visual field; OCT optical coherence tomography; IOP intraocular pressure; BRVO Branch retinal vein occlusion; CRVO central retinal vein occlusion; CRAO central retinal artery occlusion; BRAO branch retinal artery occlusion; RT retinal tear; SB scleral buckle; PPV pars plana vitrectomy; VH Vitreous hemorrhage; PRP panretinal laser photocoagulation; IVK intravitreal kenalog; VMT vitreomacular traction; MH Macular hole;  NVD neovascularization of the disc; NVE neovascularization elsewhere; AREDS age related eye disease study; ARMD age related macular degeneration; POAG primary open angle glaucoma; EBMD epithelial/anterior basement membrane dystrophy; ACIOL anterior chamber intraocular lens; IOL intraocular lens; PCIOL posterior chamber intraocular lens; Phaco/IOL phacoemulsification with intraocular lens placement; [place] photorefractive keratectomy; LASIK laser assisted in situ keratomileusis; HTN hypertension; DM diabetes mellitus; COPD chronic obstructive pulmonary disease

## 2020-06-04 ENCOUNTER — Ambulatory Visit: Payer: Medicare Other | Admitting: Registered Nurse

## 2020-06-05 ENCOUNTER — Encounter (INDEPENDENT_AMBULATORY_CARE_PROVIDER_SITE_OTHER): Payer: Self-pay | Admitting: Ophthalmology

## 2020-06-05 DIAGNOSIS — H25813 Combined forms of age-related cataract, bilateral: Secondary | ICD-10-CM

## 2020-06-05 DIAGNOSIS — E113313 Type 2 diabetes mellitus with moderate nonproliferative diabetic retinopathy with macular edema, bilateral: Secondary | ICD-10-CM

## 2020-06-05 DIAGNOSIS — I10 Essential (primary) hypertension: Secondary | ICD-10-CM

## 2020-06-05 DIAGNOSIS — H3581 Retinal edema: Secondary | ICD-10-CM

## 2020-06-05 DIAGNOSIS — H35033 Hypertensive retinopathy, bilateral: Secondary | ICD-10-CM

## 2020-09-03 ENCOUNTER — Encounter: Payer: Self-pay | Admitting: Gastroenterology

## 2021-02-19 ENCOUNTER — Inpatient Hospital Stay (HOSPITAL_COMMUNITY)
Admission: EM | Admit: 2021-02-19 | Discharge: 2021-02-25 | DRG: 065 | Disposition: A | Discharge status: Home / Self Care | Payer: Medicare Other | Attending: Internal Medicine | Admitting: Internal Medicine

## 2021-02-19 ENCOUNTER — Encounter (HOSPITAL_COMMUNITY): Payer: Self-pay | Admitting: Internal Medicine

## 2021-02-19 ENCOUNTER — Observation Stay (HOSPITAL_COMMUNITY): Payer: Medicare Other

## 2021-02-19 ENCOUNTER — Emergency Department (HOSPITAL_COMMUNITY): Payer: Medicare Other

## 2021-02-19 ENCOUNTER — Other Ambulatory Visit: Payer: Self-pay

## 2021-02-19 DIAGNOSIS — Z23 Encounter for immunization: Secondary | ICD-10-CM

## 2021-02-19 DIAGNOSIS — H538 Other visual disturbances: Secondary | ICD-10-CM | POA: Diagnosis not present

## 2021-02-19 DIAGNOSIS — Z6839 Body mass index (BMI) 39.0-39.9, adult: Secondary | ICD-10-CM

## 2021-02-19 DIAGNOSIS — I152 Hypertension secondary to endocrine disorders: Secondary | ICD-10-CM | POA: Diagnosis present

## 2021-02-19 DIAGNOSIS — Z9114 Patient's other noncompliance with medication regimen: Secondary | ICD-10-CM

## 2021-02-19 DIAGNOSIS — Z20822 Contact with and (suspected) exposure to covid-19: Secondary | ICD-10-CM | POA: Diagnosis present

## 2021-02-19 DIAGNOSIS — Z7982 Long term (current) use of aspirin: Secondary | ICD-10-CM

## 2021-02-19 DIAGNOSIS — E7800 Pure hypercholesterolemia, unspecified: Secondary | ICD-10-CM | POA: Diagnosis not present

## 2021-02-19 DIAGNOSIS — Z8249 Family history of ischemic heart disease and other diseases of the circulatory system: Secondary | ICD-10-CM

## 2021-02-19 DIAGNOSIS — I63432 Cerebral infarction due to embolism of left posterior cerebral artery: Secondary | ICD-10-CM | POA: Diagnosis not present

## 2021-02-19 DIAGNOSIS — E669 Obesity, unspecified: Secondary | ICD-10-CM | POA: Diagnosis present

## 2021-02-19 DIAGNOSIS — Z7984 Long term (current) use of oral hypoglycemic drugs: Secondary | ICD-10-CM

## 2021-02-19 DIAGNOSIS — I639 Cerebral infarction, unspecified: Secondary | ICD-10-CM | POA: Diagnosis not present

## 2021-02-19 DIAGNOSIS — Z96652 Presence of left artificial knee joint: Secondary | ICD-10-CM | POA: Diagnosis not present

## 2021-02-19 DIAGNOSIS — I6389 Other cerebral infarction: Secondary | ICD-10-CM | POA: Diagnosis not present

## 2021-02-19 DIAGNOSIS — N1831 Chronic kidney disease, stage 3a: Secondary | ICD-10-CM

## 2021-02-19 DIAGNOSIS — Z825 Family history of asthma and other chronic lower respiratory diseases: Secondary | ICD-10-CM

## 2021-02-19 DIAGNOSIS — Z833 Family history of diabetes mellitus: Secondary | ICD-10-CM | POA: Diagnosis not present

## 2021-02-19 DIAGNOSIS — R519 Headache, unspecified: Secondary | ICD-10-CM | POA: Diagnosis not present

## 2021-02-19 DIAGNOSIS — R29701 NIHSS score 1: Secondary | ICD-10-CM | POA: Diagnosis present

## 2021-02-19 DIAGNOSIS — M1990 Unspecified osteoarthritis, unspecified site: Secondary | ICD-10-CM | POA: Diagnosis not present

## 2021-02-19 DIAGNOSIS — R4701 Aphasia: Secondary | ICD-10-CM | POA: Diagnosis present

## 2021-02-19 DIAGNOSIS — Z809 Family history of malignant neoplasm, unspecified: Secondary | ICD-10-CM | POA: Diagnosis not present

## 2021-02-19 DIAGNOSIS — Z79899 Other long term (current) drug therapy: Secondary | ICD-10-CM

## 2021-02-19 DIAGNOSIS — I10 Essential (primary) hypertension: Secondary | ICD-10-CM | POA: Diagnosis not present

## 2021-02-19 DIAGNOSIS — I4510 Unspecified right bundle-branch block: Secondary | ICD-10-CM | POA: Diagnosis present

## 2021-02-19 DIAGNOSIS — N179 Acute kidney failure, unspecified: Secondary | ICD-10-CM | POA: Diagnosis not present

## 2021-02-19 DIAGNOSIS — E1165 Type 2 diabetes mellitus with hyperglycemia: Secondary | ICD-10-CM | POA: Diagnosis present

## 2021-02-19 DIAGNOSIS — E119 Type 2 diabetes mellitus without complications: Secondary | ICD-10-CM

## 2021-02-19 DIAGNOSIS — E785 Hyperlipidemia, unspecified: Secondary | ICD-10-CM

## 2021-02-19 DIAGNOSIS — E1169 Type 2 diabetes mellitus with other specified complication: Secondary | ICD-10-CM | POA: Diagnosis present

## 2021-02-19 LAB — DIFFERENTIAL
Abs Immature Granulocytes: 0.02 10*3/uL (ref 0.00–0.07)
Basophils Absolute: 0 10*3/uL (ref 0.0–0.1)
Basophils Relative: 0 %
Eosinophils Absolute: 0.1 10*3/uL (ref 0.0–0.5)
Eosinophils Relative: 1 %
Immature Granulocytes: 0 %
Lymphocytes Relative: 35 %
Lymphs Abs: 2.6 10*3/uL (ref 0.7–4.0)
Monocytes Absolute: 0.4 10*3/uL (ref 0.1–1.0)
Monocytes Relative: 6 %
Neutro Abs: 4.2 10*3/uL (ref 1.7–7.7)
Neutrophils Relative %: 58 %

## 2021-02-19 LAB — CBC
HCT: 42.5 % (ref 39.0–52.0)
Hemoglobin: 14.6 g/dL (ref 13.0–17.0)
MCH: 31.1 pg (ref 26.0–34.0)
MCHC: 34.4 g/dL (ref 30.0–36.0)
MCV: 90.6 fL (ref 80.0–100.0)
Platelets: 219 10*3/uL (ref 150–400)
RBC: 4.69 MIL/uL (ref 4.22–5.81)
RDW: 12.6 % (ref 11.5–15.5)
WBC: 7.4 10*3/uL (ref 4.0–10.5)
nRBC: 0 % (ref 0.0–0.2)

## 2021-02-19 LAB — URINALYSIS, ROUTINE W REFLEX MICROSCOPIC
Bilirubin Urine: NEGATIVE
Glucose, UA: 250 mg/dL — AB
Ketones, ur: NEGATIVE mg/dL
Leukocytes,Ua: NEGATIVE
Nitrite: NEGATIVE
Protein, ur: 30 mg/dL — AB
Specific Gravity, Urine: 1.02 (ref 1.005–1.030)
pH: 6 (ref 5.0–8.0)

## 2021-02-19 LAB — COMPREHENSIVE METABOLIC PANEL
ALT: 32 U/L (ref 0–44)
AST: 36 U/L (ref 15–41)
Albumin: 3.8 g/dL (ref 3.5–5.0)
Alkaline Phosphatase: 69 U/L (ref 38–126)
Anion gap: 11 (ref 5–15)
BUN: 18 mg/dL (ref 8–23)
CO2: 25 mmol/L (ref 22–32)
Calcium: 9.4 mg/dL (ref 8.9–10.3)
Chloride: 99 mmol/L (ref 98–111)
Creatinine, Ser: 1.06 mg/dL (ref 0.61–1.24)
GFR, Estimated: >60 mL/min (ref >60)
Glucose, Bld: 304 mg/dL — ABNORMAL HIGH (ref 70–99)
Potassium: 4.4 mmol/L (ref 3.5–5.1)
Sodium: 135 mmol/L (ref 135–145)
Total Bilirubin: 0.7 mg/dL (ref 0.3–1.2)
Total Protein: 7.7 g/dL (ref 6.5–8.1)

## 2021-02-19 LAB — RAPID URINE DRUG SCREEN, HOSP PERFORMED
Amphetamines: NOT DETECTED
Barbiturates: NOT DETECTED
Benzodiazepines: NOT DETECTED
Cocaine: NOT DETECTED
Opiates: NOT DETECTED
Tetrahydrocannabinol: NOT DETECTED

## 2021-02-19 LAB — URINALYSIS, MICROSCOPIC (REFLEX)
Bacteria, UA: NONE SEEN
Squamous Epithelial / HPF: NONE SEEN (ref 0–5)

## 2021-02-19 LAB — RESP PANEL BY RT-PCR (FLU A&B, COVID) ARPGX2
Influenza A by PCR: NEGATIVE
Influenza B by PCR: NEGATIVE
SARS Coronavirus 2 by RT PCR: NEGATIVE

## 2021-02-19 LAB — I-STAT CHEM 8, ED
BUN: 21 mg/dL (ref 8–23)
Calcium, Ion: 1.09 mmol/L — ABNORMAL LOW (ref 1.15–1.40)
Chloride: 100 mmol/L (ref 98–111)
Creatinine, Ser: 1 mg/dL (ref 0.61–1.24)
Glucose, Bld: 307 mg/dL — ABNORMAL HIGH (ref 70–99)
HCT: 43 % (ref 39.0–52.0)
Hemoglobin: 14.6 g/dL (ref 13.0–17.0)
Potassium: 4.4 mmol/L (ref 3.5–5.1)
Sodium: 135 mmol/L (ref 135–145)
TCO2: 26 mmol/L (ref 22–32)

## 2021-02-19 LAB — PROTIME-INR
INR: 1.1 (ref 0.8–1.2)
Prothrombin Time: 14.2 seconds (ref 11.4–15.2)

## 2021-02-19 LAB — APTT: aPTT: 29 seconds (ref 24–36)

## 2021-02-19 LAB — ETHANOL: Alcohol, Ethyl (B): <10 mg/dL (ref <10)

## 2021-02-19 LAB — CBG MONITORING, ED: Glucose-Capillary: 220 mg/dL — ABNORMAL HIGH (ref 70–99)

## 2021-02-19 MED ORDER — INSULIN ASPART 100 UNIT/ML IJ SOLN
0.0000 [IU] | Freq: Three times a day (TID) | INTRAMUSCULAR | Status: DC
  Administered 2021-02-19: 7 [IU] via SUBCUTANEOUS
  Administered 2021-02-20 (×2): 4 [IU] via SUBCUTANEOUS
  Administered 2021-02-20: 7 [IU] via SUBCUTANEOUS
  Administered 2021-02-21: 3 [IU] via SUBCUTANEOUS
  Administered 2021-02-21 (×2): 7 [IU] via SUBCUTANEOUS
  Administered 2021-02-22: 11 [IU] via SUBCUTANEOUS
  Administered 2021-02-22 – 2021-02-23 (×2): 4 [IU] via SUBCUTANEOUS
  Administered 2021-02-23: 11 [IU] via SUBCUTANEOUS
  Administered 2021-02-23 – 2021-02-24 (×2): 4 [IU] via SUBCUTANEOUS
  Administered 2021-02-24: 3 [IU] via SUBCUTANEOUS
  Administered 2021-02-24 – 2021-02-25 (×2): 7 [IU] via SUBCUTANEOUS
  Administered 2021-02-25: 07:00:00 4 [IU] via SUBCUTANEOUS

## 2021-02-19 MED ORDER — ACETAMINOPHEN 325 MG PO TABS: 650.0000 mg | ORAL_TABLET | ORAL | Status: DC | PRN

## 2021-02-19 MED ORDER — STROKE: EARLY STAGES OF RECOVERY BOOK: Freq: Once | Status: DC

## 2021-02-19 MED ORDER — ACETAMINOPHEN 650 MG RE SUPP: 650.0000 mg | RECTAL | Status: DC | PRN

## 2021-02-19 MED ORDER — ASPIRIN 325 MG PO TABS
325.0000 mg | ORAL_TABLET | Freq: Every day | ORAL | Status: DC
  Administered 2021-02-19 – 2021-02-25 (×7): 325 mg via ORAL
  Filled 2021-02-19 (×7): qty 1

## 2021-02-19 MED ORDER — ENOXAPARIN SODIUM 40 MG/0.4ML IJ SOSY
40.0000 mg | PREFILLED_SYRINGE | INTRAMUSCULAR | Status: DC
  Administered 2021-02-19 – 2021-02-24 (×6): 40 mg via SUBCUTANEOUS
  Filled 2021-02-19 (×6): qty 0.4

## 2021-02-19 MED ORDER — ACETAMINOPHEN 160 MG/5ML PO SOLN: 650.0000 mg | ORAL | Status: DC | PRN

## 2021-02-19 MED ORDER — INSULIN ASPART 100 UNIT/ML IJ SOLN: 0.0000 [IU] | Freq: Three times a day (TID) | INTRAMUSCULAR | Status: DC

## 2021-02-19 MED ORDER — GADOBUTROL 1 MMOL/ML IV SOLN
10.0000 mL | Freq: Once | INTRAVENOUS | Status: AC | PRN
  Administered 2021-02-19: 10 mL via INTRAVENOUS

## 2021-02-19 MED ORDER — INSULIN ASPART 100 UNIT/ML IJ SOLN
0.0000 [IU] | Freq: Every day | INTRAMUSCULAR | Status: DC
  Administered 2021-02-19: 2 [IU] via SUBCUTANEOUS

## 2021-02-19 NOTE — Assessment & Plan Note (Addendum)
Poorly controlled suggested by HbA1c of 12.3.  Has not been taking any of his oral medications.  Will be started on glimepiride at discharge.  Glucometer will be prescribed.  Close outpatient follow-up with PCP.

## 2021-02-19 NOTE — Assessment & Plan Note (Addendum)
LDL noted to be 124.  Started back on statin.

## 2021-02-19 NOTE — Consult Note (Signed)
NEUROLOGY CONSULTATION NOTE   Date of service: February 19, 2021 Patient Name: Joe Stewart MRN:  [id number] DOB:  [birth year] Reason for consult: "Left temporal stroke" Requesting Provider: Bonnielee Haff, MD _ _ _   _ __   _ __ _ _  __ __   _ __   __ _  History of Present Illness  Joe Stewart is a 66 y.o. male with PMH significant for diabetes, hypertension, hyperlipidemia, obesity who presents with 2-day history of left temporal headache along with right-sided blurred vision and having to focus on his words to be able to speak appropriately.  He also reports that he feels that his speech seems like he is talking out of a tunnel.  He reports that he got tired of taking multiple medications and stopped taking his diabetes, blood pressure and cholesterol medications about 6 months ago.  He is unable to provide a specific reason apart from polypharmacy.  He does not smoke, does not use marijuana, rare alcohol use, no family history of strokes.  Never been diagnosed with atrial fibrillation.  LKW: 02/17/2021. mRS: 0 tNKASE/Thrombectomy: Not offered due to patient being outside the window. NIHSS components Score: Comment  1a Level of Conscious 0[x]  1[]  2[]  3[]      1b LOC Questions 0[x]  1[]  2[]       1c LOC Commands 0[x]  1[]  2[]       2 Best Gaze 0[x]  1[]  2[]       3 Visual 0[x]  1[]  2[]  3[]    R blurred vision but no aphasia.  4 Facial Palsy 0[x]  1[]  2[]  3[]      5a Motor Arm - left 0[x]  1[]  2[]  3[]  4[]  UN[]    5b Motor Arm - Right 0[x]  1[]  2[]  3[]  4[]  UN[]    6a Motor Leg - Left 0[x]  1[]  2[]  3[]  4[]  UN[]    6b Motor Leg - Right 0[x]  1[]  2[]  3[]  4[]  UN[]    7 Limb Ataxia 0[x]  1[]  2[]  3[]  UN[]     8 Sensory 0[x]  1[]  2[]  UN[]      9 Best Language 0[x]  1[]  2[]  3[]      10 Dysarthria 0[x]  1[]  2[]  UN[]      11 Extinct. and Inattention 0[x]  1[]  2[]       TOTAL:        ROS   Constitutional Denies weight loss, fever and chills.   HEENT Right-sided blurred vision and speech feels like he is talking out  of a tunnel.  Respiratory Denies SOB and cough.   CV Denies palpitations and CP   GI Denies abdominal pain, nausea, vomiting and diarrhea.   GU Denies dysuria and urinary frequency.   MSK Denies myalgia and joint pain.   Skin Denies rash and pruritus.   Neurological Denies headache and syncope.   Psychiatric Denies recent changes in mood. Denies anxiety and depression.    Past History   Past Medical History:  Diagnosis Date   Arthritis    Cataract    Mixed OU   Diabetes mellitus without complication ([place])    Diabetic retinopathy ([place])    NPDR OU   Hyperlipidemia    Hypertension    Hypertensive retinopathy    OU   Obesity    Past Surgical History:  Procedure Laterality Date   BIOPSY  11/29/2019   Procedure: BIOPSY;  Surgeon: Irving Copas., MD;  Location: [facility];  Service: Gastroenterology;;   COLONOSCOPY WITH PROPOFOL N/A 11/29/2019   Procedure: COLONOSCOPY WITH PROPOFOL;  Surgeon: Irving Copas., MD;  [facility];  Service: Gastroenterology;  Laterality: N/A;   ENDOSCOPIC MUCOSAL RESECTION N/A 11/29/2019   Procedure: ENDOSCOPIC MUCOSAL RESECTION;  Surgeon: Rush Landmark Telford Nab., MD;  [facility];  Service: Gastroenterology;  Laterality: N/A;   ESOPHAGOGASTRODUODENOSCOPY (EGD) WITH PROPOFOL N/A 11/29/2019   Procedure: ESOPHAGOGASTRODUODENOSCOPY (EGD) WITH PROPOFOL;  Surgeon: Rush Landmark Telford Nab., MD;  [facility];  Service: Gastroenterology;  Laterality: N/A;   EYE SURGERY Left    had to have eye flushed after getting costic sodium in L eye   HEMOSTASIS CLIP PLACEMENT  11/29/2019   Procedure: HEMOSTASIS CLIP PLACEMENT;  Surgeon: Irving Copas., MD;  [facility];  Service: Gastroenterology;;   HERNIA REPAIR     HERNIA REPAIR     35 years ago   HOT HEMOSTASIS N/A 11/29/2019   Procedure: HOT HEMOSTASIS (ARGON PLASMA COAGULATION/BICAP);  Surgeon: Irving Copas., MD;   Location: [facility];  Service: Gastroenterology;  Laterality: N/A;   LEFT HEART CATH AND CORONARY ANGIOGRAPHY N/A 04/11/2019   Procedure: LEFT HEART CATH AND CORONARY ANGIOGRAPHY;  Surgeon: Adrian Prows, MD;  Location: [facility];  Service: Cardiovascular;  Laterality: N/A;   POLYPECTOMY  11/29/2019   Procedure: POLYPECTOMY;  Surgeon: Rush Landmark Telford Nab., MD;  Location: [facility];  Service: Gastroenterology;;   RENAL ANGIOGRAPHY Bilateral 04/11/2019   Procedure: RENAL ANGIOGRAPHY;  Surgeon: Adrian Prows, MD;  Location: [facility];  Service: Cardiovascular;  Laterality: Bilateral;   SUBMUCOSAL LIFTING INJECTION  11/29/2019   Procedure: SUBMUCOSAL LIFTING INJECTION;  Surgeon: Irving Copas., MD;  Location: [facility];  Service: Gastroenterology;;   TOTAL KNEE ARTHROPLASTY Left 08/01/2019   Procedure: LEFT TOTAL KNEE ARTHROPLASTY;  Surgeon: Meredith Pel, MD;  Location: [facility];  Service: Orthopedics;  Laterality: Left;   Family History  Problem Relation Age of Onset   Diabetes Mother    COPD Mother    Cancer Mother        unknown type    Heart disease Father    COPD Sister    Healthy Brother    Heart disease Brother    Healthy Son    Colon cancer Neg Hx    Colon polyps Neg Hx    Esophageal cancer Neg Hx    Rectal cancer Neg Hx    Stomach cancer Neg Hx    Inflammatory bowel disease Neg Hx    Liver disease Neg Hx    Pancreatic cancer Neg Hx    Social History   Socioeconomic History   Marital status: Single    Spouse name: Not on file   Number of children: 1   Years of education: Not on file   Highest education level: Not on file  Occupational History   Not on file  Tobacco Use   Smoking status: Never   Smokeless tobacco: Never  Vaping Use   Vaping Use: Never used  Substance and Sexual Activity   Alcohol use: Not Currently   Drug use: Never   Sexual activity: Not Currently  Other Topics Concern   Not on  file  Social History Narrative   Not on file   Social Determinants of Health   Financial Resource Strain: Not on file  Food Insecurity: Not on file  Transportation Needs: Not on file  Physical Activity: Not on file  Stress: Not on file  Social Connections: Not on file   No Known Allergies  Medications  (Not in a hospital admission)    Vitals   Vitals:   02/19/21 1315  02/19/21 1520 02/19/21 1533 02/19/21 1638  BP: (!) 163/81 129/74 (!) 138/55 117/88  Pulse: 88 86 86 92  Resp: 18 18 16 16   Temp: 98.8 F (37.1 C)     TempSrc: Oral     SpO2: 98% 98% 98% 97%     There is no height or weight on file to calculate BMI.  Physical Exam   General: Laying comfortably in bed; in no acute distress.  HENT: Normal oropharynx and mucosa. Normal external appearance of ears and nose.  Neck: Supple, no pain or tenderness  CV: No JVD. No peripheral edema.  Pulmonary: Symmetric Chest rise. Normal respiratory effort.  Abdomen: Soft to touch, non-tender.  Ext: No cyanosis, edema, or deformity  Skin: No rash. Normal palpation of skin.   Musculoskeletal: Normal digits and nails by inspection. No clubbing.   Neurologic Examination  Mental status/Cognition: Alert, oriented to self, place, month and year, good attention.  Speech/language: Fluent, comprehension intact, object naming intact, repetition intact.  Cranial nerves:   CN II Pupils equal and reactive to light, no VF deficits but endorses subjective R blurred vision   CN III,IV,VI EOM intact, no gaze preference or deviation, no nystagmus    CN V normal sensation in V1, V2, and V3 segments bilaterally    CN VII no asymmetry, no nasolabial fold flattening    CN VIII normal hearing to speech    CN IX & X normal palatal elevation, no uvular deviation    CN XI 5/5 head turn and 5/5 shoulder shrug bilaterally    CN XII midline tongue protrusion    Motor:  Muscle bulk: normal, tone normal, pronator drift none tremor none Mvmt Root  Nerve  Muscle Right Left Comments  SA C5/6 Ax Deltoid 5 5   EF C5/6 Mc Biceps 5 5   EE C6/7/8 Rad Triceps 5 5   WF C6/7 Med FCR     WE C7/8 PIN ECU     F Ab C8/T1 U ADM/FDI 5 5   HF L1/2/3 Fem Illopsoas 5 5   KE L2/3/4 Fem Quad 5 5   DF L4/5 D Peron Tib Ant 5 5   PF S1/2 Tibial Grc/Sol 5 5    Reflexes:  Right Left Comments  Pectoralis      Biceps (C5/6) 1 1   Brachioradialis (C5/6) 1 1    Triceps (C6/7) 1 1    Patellar (L3/4) 1 1    Achilles (S1) mute mute    Hoffman      Plantar     Jaw jerk    Sensation:  Light touch Intact throughout   Pin prick    Temperature    Vibration   Proprioception    Coordination/Complex Motor:  - Finger to Nose intact BL - Heel to shin intact BL - Rapid alternating movement are normal - Gait: deferred.  Labs   CBC:  Recent Labs  Lab 02/19/21 1345 02/19/21 1403  WBC 7.4  --   NEUTROABS 4.2  --   HGB 14.6 14.6  HCT 42.5 43.0  MCV 90.6  --   PLT 219  --     Basic Metabolic Panel:  Lab Results  Component Value Date   NA 135 02/19/2021   K 4.4 02/19/2021   CO2 25 02/19/2021   GLUCOSE 307 (H) 02/19/2021   BUN 21 02/19/2021   CREATININE 1.00 02/19/2021   CALCIUM 9.4 02/19/2021   GFRNONAA >60 02/19/2021   GFRAA 75 03/15/2020   Lipid  Panel:  Lab Results  Component Value Date   LDLCALC 81 04/07/2019   HgbA1c:  Lab Results  Component Value Date   HGBA1C 8.3 (A) 01/05/2020   Urine Drug Screen: No results found for: LABOPIA, COCAINSCRNUR, LABBENZ, AMPHETMU, THCU, LABBARB  Alcohol Level     Component Value Date/Time   ETH <10 02/19/2021 1345    CT Head without contrast(Personally reviewed): 1. Left temporal lobe hypodensity most suggestive of an acute or subacute PCA infarct.  MR Angio head without contrast and MR Angio neck with contrast: pending  MRI Brain: pending  Impression   Joe Stewart is a 66 y.o. male with PMH significant for with PMH significant for diabetes, hypertension, hyperlipidemia, obesity  who presents with 2-day history of left temporal headache along with right-sided blurred vision and having to focus on his words to be able to speak appropriately. His neurologic examination is notable for R blurred vision but no hemianopsia. Speech is fluent without aphasia but he feels like he has to really focus on his speech to get the right words out.  Found to have a left temporal hypodensity concerning for a stroke. Atypical for a lacunar stroke, could be cardioembolic or atheroembolic.  Primary Diagnosis:  Cerebral infarction, unspecified.  Secondary Diagnosis: Essential (primary) hypertension, Type 2 diabetes mellitus with hyperglycemia , and Obesity  Recommendations  Plan:  - Frequent Neuro checks per stroke unit protocol - Recommend brain imaging with MRI Brain without contrast - Recommend Vascular imaging with MRA Angio Head without contrast and US Carotid doppler - Recommend obtaining TTE - Recommend obtaining Lipid panel with LDL - Please start statin if LDL > 70 - Recommend HbA1c - Antithrombotic - Aspirin 81mg  daily - Recommend DVT ppx - SBP goal - permissive hypertension first 24 h < 220/110. Held home meds.  - Recommend Telemetry monitoring for arrythmia - Recommend bedside swallow screen prior to PO intake. - Stroke education booklet - Recommend PT/OT/SLP consult - Recommend Urine Tox screen. - Stroke team to follow. ______________________________________________________________________   Thank you for the opportunity to take part in the care of this patient. If you have any further questions, please contact the neurology consultation attending.  Signed,  [place] Pager Number [phone] _ _ _   _ __   _ __ _ _  __ __   _ __   __ _

## 2021-02-19 NOTE — Assessment & Plan Note (Addendum)
CT head raises concern for left temporal lobe infarct.  No hemorrhage was noted.    MRI brain showed acute infarct in the posterior mesial temporal lobe.  Additional infarcts were noted all in the left PCA territory.  Other vascular abnormalities were also noted. LDL 124.  HbA1c 12.3. Echocardiogram shows moderate LVH with a EF of 70 to 75%.  Grade 1 diastolic dysfunction noted. Patient seen by the stroke service.  Plan is to place him on aspirin and Plavix for 3 weeks followed by aspirin alone. Seen by PT and OT.  Did not do well with physical therapy with unsteady gait and loss of balance.  They are recommending inpatient rehabilitation.  Inpatient rehab has been consulted. Patient also exhibiting some signs of cognitive impairment.  This will need further work-up in the outpatient setting.  G99 folic acid levels were normal.  TSH was normal.  RPR is nonreactive.   Initially plan was to go to inpatient rehab but patient has made significant improvement.  He is normal to go home with outpatient PT and OT.  Neurology did recommend a 30-day heart monitor.  Message sent to cardiology to arrange.

## 2021-02-19 NOTE — Assessment & Plan Note (Addendum)
Patient has been noncompliant with his antihypertensives.  Permissive hypertension was allowed. He has not been taking any of his antihypertensives including amlodipine hydrochlorothiazide losartan and metoprolol.   Based on previous records he is followed by [facility].  He has known right bundle branch block.  Had a cardiac cath in 2021 which showed diffuse disease but was being managed medically.  Did not require any PCI.  Does not take aspirin on a daily basis per patient. Amlodipine and metoprolol will be resumed.

## 2021-02-19 NOTE — ED Notes (Signed)
Pt has wound to right pinky toe

## 2021-02-19 NOTE — ED Provider Notes (Signed)
[facility] EMERGENCY DEPARTMENT Provider Note   CSN: [id number] Arrival date & time: 02/19/21  1256     History  Chief Complaint  Patient presents with   Aphasia    Joe Stewart is a 66 y.o. male presented emergency department with visual deficit and confusion for 1 week.  Patient reports that he developed a headache, on the left temporal region, about 2 nights ago which is resolved.  He said he felt woozy and lightheaded about a week ago.  He noted that his vision seemed off, with missing pieces in his visual field at least 2 to 3 days ago.  He lives by himself.  His brother is present at the bedside reports that the patient seems confused with simple tasks today, such as closing the windows in the car, and also getting himself dressed.  Patient denies any history of stroke or TIA.  He reports that he stopped taking all of his medications about 6 months ago, although he cannot tell me why, other than say that he was "frustrated".  He was formally being treated for hypertension, diabetes, high cholesterol.  He denies history of smoking.  He denies any known history of atrial fibrillation or arrhythmia, his brother reports there is a family history of A. fib.  The patient is not taking any aspirin or blood thinners.  HPI     Home Medications Prior to Admission medications   Medication Sig Start Date End Date Taking? Authorizing Provider  amLODipine (NORVASC) 10 MG tablet TAKE 1 TABLET (10 MG TOTAL) BY MOUTH IN THE MORNING. 03/13/20   Tolia, Sunit, DO  atorvastatin (LIPITOR) 40 MG tablet TAKE 1 TABLET BY MOUTH EVERY DAY 03/06/20   Tolia, Sunit, DO  [facility] ASPIRIN ADULT LOW DOSE 81 MG chewable tablet CHEW 1 TABLET (81 MG TOTAL) BY MOUTH DAILY. 03/22/20   Magnant, Charles L, PA-C  Cyanocobalamin (VITAMIN B-12 PO) Take 1 tablet by mouth daily.    [provider]  Ferrous Sulfate Dried (HIGH POTENCY IRON) 65 MG TABS Take by mouth.    [provider]   hydrochlorothiazide (HYDRODIURIL) 25 MG tablet TAKE 1 TABLET (25 MG TOTAL) BY MOUTH IN THE MORNING. 03/05/20   Tolia, Sunit, DO  losartan (COZAAR) 25 MG tablet Take 25 mg by mouth daily. 02/09/20   [provider]  losartan (COZAAR) 50 MG tablet TAKE 1 TABLET BY MOUTH EVERY EVENING. 05/20/20   Tolia, Sunit, DO  magnesium oxide (MAG-OX) 400 MG tablet Take 1 tablet by mouth 2 (two) times daily. 02/11/20   [provider]  metFORMIN (GLUCOPHAGE) 1000 MG tablet Take 1,000 mg by mouth 2 (two) times daily. 04/14/20   [provider]  metoprolol succinate (TOPROL-XL) 25 MG 24 hr tablet TAKE 1 TABLET (25 MG TOTAL) BY MOUTH DAILY. TAKE WITH OR IMMEDIATELY FOLLOWING A MEAL. HOLD IF SYSTOLIC BLOOD PRESSURE (TOP BLOOD PRESSURE NUMBER) LESS THAN 100 MMHG OR HEART RATE LESS THAN 60 BPM (PULSE). 04/15/20 07/14/20  Tolia, Sunit, DO  omeprazole (PRILOSEC) 40 MG capsule TAKE 1 CAPSULE (40 MG TOTAL) BY MOUTH 2 (TWO) TIMES DAILY BEFORE A MEAL. 03/15/20 03/15/21  Mansouraty, Telford Nab., MD  sitaGLIPtin-metformin (JANUMET) 50-1000 MG tablet Take 1 tablet by mouth 2 (two) times daily with a meal. 01/05/20   [name], NP      Allergies    Patient has no known allergies.    Review of Systems   Review of Systems  Physical Exam Updated Vital Signs BP 105/77  Pulse 84    Temp 98.8 F (37.1 C) (Oral)    Resp 16    SpO2 96%  Physical Exam Constitutional:      General: He is not in acute distress. HENT:     Head: Normocephalic and atraumatic.  Eyes:     General: Visual field deficit present.     Conjunctiva/sclera: Conjunctivae normal.     Pupils: Pupils are equal, round, and reactive to light.  Cardiovascular:     Rate and Rhythm: Normal rate and regular rhythm.  Pulmonary:     Effort: Pulmonary effort is normal. No respiratory distress.  Abdominal:     General: There is no distension.     Tenderness: There is no abdominal tenderness.  Skin:    General: Skin is warm and dry.   Neurological:     Mental Status: He is alert and oriented to person, place, and time. Mental status is at baseline.     Cranial Nerves: No dysarthria.     Sensory: Sensation is intact.     Motor: Motor function is intact.     Coordination: Coordination is intact. Finger-Nose-Finger Test normal.     Comments: Bilateral right upper quadranopsia Some mild expressive aphasia (difficulty with" ifs ands or butts") Oriented x 4  Psychiatric:        Mood and Affect: Mood normal.        Behavior: Behavior normal.    ED Results / Procedures / Treatments   Labs (all labs ordered are listed, but only abnormal results are displayed) Labs Reviewed  COMPREHENSIVE METABOLIC PANEL - Abnormal; Notable for the following components:      Result Value   Glucose, Bld 304 (*)    All other components within normal limits  I-STAT CHEM 8, ED - Abnormal; Notable for the following components:   Glucose, Bld 307 (*)    Calcium, Ion 1.09 (*)    All other components within normal limits  RESP PANEL BY RT-PCR (FLU A&B, COVID) ARPGX2  ETHANOL  PROTIME-INR  APTT  CBC  DIFFERENTIAL  RAPID URINE DRUG SCREEN, HOSP PERFORMED  URINALYSIS, ROUTINE W REFLEX MICROSCOPIC  HIV ANTIBODY (ROUTINE TESTING W REFLEX)  HEMOGLOBIN A1C  LIPID PANEL  COMPREHENSIVE METABOLIC PANEL  CBC  TSH    EKG None  Radiology CT HEAD WO CONTRAST  Result Date: 02/19/2021 CLINICAL DATA:  TIA.  Aphasia. EXAM: CT HEAD WITHOUT CONTRAST TECHNIQUE: Contiguous axial images were obtained from the base of the skull through the vertex without intravenous contrast. RADIATION DOSE REDUCTION: This exam was performed according to the departmental dose-optimization program which includes automated exposure control, adjustment of the mA and/or kV according to patient size and/or use of iterative reconstruction technique. COMPARISON:  None. FINDINGS: Brain: There is hypoattenuation involving cortex and white matter in the posterior mesial left  temporal lobe. No intracranial hemorrhage, midline shift, or extra-axial fluid collection is identified. The ventricles are normal in size. Vascular: Calcified atherosclerosis at the skull base. No hyperdense vessel. Skull: No fracture or suspicious osseous lesion. Sinuses/Orbits: Mild mucosal thickening in the right maxillary sinus. Clear mastoid air cells. Unremarkable orbits. Other: None. IMPRESSION: 1. Left temporal lobe hypodensity most suggestive of an acute or subacute PCA infarct. Brain MRI is recommended for further evaluation. 2. No intracranial hemorrhage. Electronically Signed   By: Logan Bores M.D.   On: 02/19/2021 14:17   MR ANGIO HEAD WO CONTRAST  Result Date: 02/19/2021 CLINICAL DATA:  Stroke, TIA, determine embolic source; headache and vision  impairment with 2 days of blurred vision EXAM: MRI HEAD WITHOUT CONTRAST MRA HEAD WITHOUT CONTRAST MRA NECK WITHOUT AND WITH CONTRAST TECHNIQUE: Multiplanar, multi-echo pulse sequences of the brain and surrounding structures were acquired without intravenous contrast. Angiographic images of the Circle of Willis were acquired using MRA technique without intravenous contrast. Angiographic images of the neck were acquired using MRA technique without and with intravenous contrast. Carotid stenosis measurements (when applicable) are obtained utilizing NASCET criteria, using the distal internal carotid diameter as the denominator. CONTRAST:  10mL GADAVIST GADOBUTROL 1 MMOL/ML IV SOLN COMPARISON:  No prior MRI or MRA. Correlation is made with CT head 02/19/2021 FINDINGS: MRI HEAD FINDINGS Brain: Restricted diffusion with ADC correlate in the mesial left temporal lobe, measuring up to 4.3 x 2.2 x 2.2 cm (series 5, image 73 and series 7, image 48), which correlates with the hypodensity seen on the same-day CT. Additional scattered foci of restricted diffusion with ADC correlates in the left occipital lobe (series 5, image 75 and 76) as well as in the posterior left  thalamus (series 5, image 77). No acute hemorrhage, mass, mass effect, or midline shift. Mildly increased T2 signal is associated with the previously mentioned areas of restricted diffusion, likely mild edema. No hydrocephalus or extra-axial collection. Vascular: Please see MRA findings below Skull and upper cervical spine: Normal marrow signal. Possible mild spinal canal stenosis C2-C3. Sinuses/Orbits: No acute or significant finding. Other: The mastoids are well aerated. MRA HEAD FINDINGS Anterior circulation: Both internal carotid arteries are patent to the termini, without stenosis or other abnormality. A1 segments patent, with mild focal narrowing in the mid right A1 (series 1, image 101). Normal anterior communicating artery. Anterior cerebral arteries are patent to their distal aspects. No M1 stenosis or occlusion. Normal MCA bifurcations. Distal MCA branches perfused and symmetric. Posterior circulation: Vertebral arteries patent to the vertebrobasilar junction without stenosis. Right dominant. Basilar patent to its distal aspect. Superior cerebellar arteries patent bilaterally. Patent short right P1, with prominent right posterior communicating artery. The right PCA is well perfused. Poor visualization of the left P1, which likely shares a common origin with the left SCA (series 1, image 71) poor opacification of the P1 proximally (series 1, image 75-80), with patent left posterior communicating artery. Focal non opacification of the left P2 (series 1 images 85-89). Poor perfusion of the remainder of the left P2 (series 1, image 87), with nonvisualization of the left P3 segments. Anatomic variants: None significant MRA NECK FINDINGS Aortic arch: Three-vessel arch. No evidence of aneurysm, dissection, or significant proximal stenosis. Right carotid system: Patent, without significant stenosis, dissection, or occlusion. Left carotid system: Patent, without significant stenosis, dissection, or occlusion.  Vertebral arteries: The right vertebral artery is visualized from its origin to the skull base. There is significant tortuosity in the midcervical spine (series 11, image 49). The left vertebral artery is diminutive and minimally visualized from just distal to its origin to the skull base, although it does appear patent. The origin of the left vertebral artery is not well evaluated, likely secondary to artifact. Other: None IMPRESSION: 1. Acute infarct in the posterior mesial temporal lobe, with additional scattered infarcts in the posterior left thalamus and left occipital lobe, consistent with left PCA territory infarct. 2. Poor visualization of the left PCA, with non opacification of the proximal left P2, as well as P3 segments. Poor opacification of the left P1 and more distal P2 segment. No other significant intracranial stenosis. 3. Diminutive left vertebral artery, which is  not dominant. This may be congenital and or may represent atherosclerotic stenosis. No other significant stenosis in the neck. Code stroke imaging results were communicated on 02/19/2021 at 6:38 pm to provider Dr. Lorrin Goodell via secure text paging. Electronically Signed   By: Merilyn Baba M.D.   On: 02/19/2021 18:39   MR Angiogram Neck W or Wo Contrast  Result Date: 02/19/2021 CLINICAL DATA:  Stroke, TIA, determine embolic source; headache and vision impairment with 2 days of blurred vision EXAM: MRI HEAD WITHOUT CONTRAST MRA HEAD WITHOUT CONTRAST MRA NECK WITHOUT AND WITH CONTRAST TECHNIQUE: Multiplanar, multi-echo pulse sequences of the brain and surrounding structures were acquired without intravenous contrast. Angiographic images of the Circle of Willis were acquired using MRA technique without intravenous contrast. Angiographic images of the neck were acquired using MRA technique without and with intravenous contrast. Carotid stenosis measurements (when applicable) are obtained utilizing NASCET criteria, using the distal internal  carotid diameter as the denominator. CONTRAST:  62mL GADAVIST GADOBUTROL 1 MMOL/ML IV SOLN COMPARISON:  No prior MRI or MRA. Correlation is made with CT head 02/19/2021 FINDINGS: MRI HEAD FINDINGS Brain: Restricted diffusion with ADC correlate in the mesial left temporal lobe, measuring up to 4.3 x 2.2 x 2.2 cm (series 5, image 73 and series 7, image 48), which correlates with the hypodensity seen on the same-day CT. Additional scattered foci of restricted diffusion with ADC correlates in the left occipital lobe (series 5, image 75 and 76) as well as in the posterior left thalamus (series 5, image 77). No acute hemorrhage, mass, mass effect, or midline shift. Mildly increased T2 signal is associated with the previously mentioned areas of restricted diffusion, likely mild edema. No hydrocephalus or extra-axial collection. Vascular: Please see MRA findings below Skull and upper cervical spine: Normal marrow signal. Possible mild spinal canal stenosis C2-C3. Sinuses/Orbits: No acute or significant finding. Other: The mastoids are well aerated. MRA HEAD FINDINGS Anterior circulation: Both internal carotid arteries are patent to the termini, without stenosis or other abnormality. A1 segments patent, with mild focal narrowing in the mid right A1 (series 1, image 101). Normal anterior communicating artery. Anterior cerebral arteries are patent to their distal aspects. No M1 stenosis or occlusion. Normal MCA bifurcations. Distal MCA branches perfused and symmetric. Posterior circulation: Vertebral arteries patent to the vertebrobasilar junction without stenosis. Right dominant. Basilar patent to its distal aspect. Superior cerebellar arteries patent bilaterally. Patent short right P1, with prominent right posterior communicating artery. The right PCA is well perfused. Poor visualization of the left P1, which likely shares a common origin with the left SCA (series 1, image 71) poor opacification of the P1 proximally (series  1, image 75-80), with patent left posterior communicating artery. Focal non opacification of the left P2 (series 1 images 85-89). Poor perfusion of the remainder of the left P2 (series 1, image 87), with nonvisualization of the left P3 segments. Anatomic variants: None significant MRA NECK FINDINGS Aortic arch: Three-vessel arch. No evidence of aneurysm, dissection, or significant proximal stenosis. Right carotid system: Patent, without significant stenosis, dissection, or occlusion. Left carotid system: Patent, without significant stenosis, dissection, or occlusion. Vertebral arteries: The right vertebral artery is visualized from its origin to the skull base. There is significant tortuosity in the midcervical spine (series 11, image 49). The left vertebral artery is diminutive and minimally visualized from just distal to its origin to the skull base, although it does appear patent. The origin of the left vertebral artery is not well evaluated, likely secondary  to artifact. Other: None IMPRESSION: 1. Acute infarct in the posterior mesial temporal lobe, with additional scattered infarcts in the posterior left thalamus and left occipital lobe, consistent with left PCA territory infarct. 2. Poor visualization of the left PCA, with non opacification of the proximal left P2, as well as P3 segments. Poor opacification of the left P1 and more distal P2 segment. No other significant intracranial stenosis. 3. Diminutive left vertebral artery, which is not dominant. This may be congenital and or may represent atherosclerotic stenosis. No other significant stenosis in the neck. Code stroke imaging results were communicated on 02/19/2021 at 6:38 pm to provider Dr. Lorrin Goodell via secure text paging. Electronically Signed   By: Merilyn Baba M.D.   On: 02/19/2021 18:39   MR BRAIN WO CONTRAST  Result Date: 02/19/2021 CLINICAL DATA:  Stroke, TIA, determine embolic source; headache and vision impairment with 2 days of blurred  vision EXAM: MRI HEAD WITHOUT CONTRAST MRA HEAD WITHOUT CONTRAST MRA NECK WITHOUT AND WITH CONTRAST TECHNIQUE: Multiplanar, multi-echo pulse sequences of the brain and surrounding structures were acquired without intravenous contrast. Angiographic images of the Circle of Willis were acquired using MRA technique without intravenous contrast. Angiographic images of the neck were acquired using MRA technique without and with intravenous contrast. Carotid stenosis measurements (when applicable) are obtained utilizing NASCET criteria, using the distal internal carotid diameter as the denominator. CONTRAST:  85mL GADAVIST GADOBUTROL 1 MMOL/ML IV SOLN COMPARISON:  No prior MRI or MRA. Correlation is made with CT head 02/19/2021 FINDINGS: MRI HEAD FINDINGS Brain: Restricted diffusion with ADC correlate in the mesial left temporal lobe, measuring up to 4.3 x 2.2 x 2.2 cm (series 5, image 73 and series 7, image 48), which correlates with the hypodensity seen on the same-day CT. Additional scattered foci of restricted diffusion with ADC correlates in the left occipital lobe (series 5, image 75 and 76) as well as in the posterior left thalamus (series 5, image 77). No acute hemorrhage, mass, mass effect, or midline shift. Mildly increased T2 signal is associated with the previously mentioned areas of restricted diffusion, likely mild edema. No hydrocephalus or extra-axial collection. Vascular: Please see MRA findings below Skull and upper cervical spine: Normal marrow signal. Possible mild spinal canal stenosis C2-C3. Sinuses/Orbits: No acute or significant finding. Other: The mastoids are well aerated. MRA HEAD FINDINGS Anterior circulation: Both internal carotid arteries are patent to the termini, without stenosis or other abnormality. A1 segments patent, with mild focal narrowing in the mid right A1 (series 1, image 101). Normal anterior communicating artery. Anterior cerebral arteries are patent to their distal aspects. No  M1 stenosis or occlusion. Normal MCA bifurcations. Distal MCA branches perfused and symmetric. Posterior circulation: Vertebral arteries patent to the vertebrobasilar junction without stenosis. Right dominant. Basilar patent to its distal aspect. Superior cerebellar arteries patent bilaterally. Patent short right P1, with prominent right posterior communicating artery. The right PCA is well perfused. Poor visualization of the left P1, which likely shares a common origin with the left SCA (series 1, image 71) poor opacification of the P1 proximally (series 1, image 75-80), with patent left posterior communicating artery. Focal non opacification of the left P2 (series 1 images 85-89). Poor perfusion of the remainder of the left P2 (series 1, image 87), with nonvisualization of the left P3 segments. Anatomic variants: None significant MRA NECK FINDINGS Aortic arch: Three-vessel arch. No evidence of aneurysm, dissection, or significant proximal stenosis. Right carotid system: Patent, without significant stenosis, dissection, or occlusion. Left  carotid system: Patent, without significant stenosis, dissection, or occlusion. Vertebral arteries: The right vertebral artery is visualized from its origin to the skull base. There is significant tortuosity in the midcervical spine (series 11, image 49). The left vertebral artery is diminutive and minimally visualized from just distal to its origin to the skull base, although it does appear patent. The origin of the left vertebral artery is not well evaluated, likely secondary to artifact. Other: None IMPRESSION: 1. Acute infarct in the posterior mesial temporal lobe, with additional scattered infarcts in the posterior left thalamus and left occipital lobe, consistent with left PCA territory infarct. 2. Poor visualization of the left PCA, with non opacification of the proximal left P2, as well as P3 segments. Poor opacification of the left P1 and more distal P2 segment. No other  significant intracranial stenosis. 3. Diminutive left vertebral artery, which is not dominant. This may be congenital and or may represent atherosclerotic stenosis. No other significant stenosis in the neck. Code stroke imaging results were communicated on 02/19/2021 at 6:38 pm to provider Dr. Lorrin Goodell via secure text paging. Electronically Signed   By: Merilyn Baba M.D.   On: 02/19/2021 18:39    Procedures Procedures    Medications Ordered in ED Medications  aspirin tablet 325 mg (325 mg Oral Given 02/19/21 1622)   stroke: mapping our early stages of recovery book (has no administration in time range)  acetaminophen (TYLENOL) tablet 650 mg (has no administration in time range)    Or  acetaminophen (TYLENOL) 160 MG/5ML solution 650 mg (has no administration in time range)    Or  acetaminophen (TYLENOL) suppository 650 mg (has no administration in time range)  enoxaparin (LOVENOX) injection 40 mg (40 mg Subcutaneous Given 02/19/21 1820)  insulin aspart (novoLOG) injection 0-5 Units (has no administration in time range)  insulin aspart (novoLOG) injection 0-20 Units (7 Units Subcutaneous Given 02/19/21 1820)  gadobutrol (GADAVIST) 1 MMOL/ML injection 10 mL (10 mLs Intravenous Contrast Given 02/19/21 1807)    ED Course/ Medical Decision Making/ A&P                           Medical Decision Making Risk Prescription drug management. Decision regarding hospitalization.   This patient presents to the ED with concern for blurred vision and confusion; this involves an extensive number of treatment options, and is a complaint that carries with it a high risk of complications and morbidity.  The differential diagnosis includes CVA vs infection vs other  Co-morbidities that complicate the patient evaluation: diabetes, higher risk for CVA/stroke  Additional history obtained from daughter at bedside  I ordered and personally interpreted labs.  The pertinent results include:  hyperglycemia, no  anion gap, WBC wnl, doubt DKA  I ordered imaging studies including CT head I independently visualized and interpreted imaging which showed subacute infarct I agree with the radiologist interpretation  The patient was maintained on a cardiac monitor.  I personally viewed and interpreted the cardiac monitored which showed an underlying rhythm of: NSR  Per my interpretation the patient's ECG shows NSR  I ordered medication including aspirin 325 for stroke I have reviewed the patients home medicines and have made adjustments as needed  Test Considered: doubt sepsis, meningitis - did not feel LP was necessary  I requested consultation with the neurology, and discussed lab and imaging findings as well as pertinent plan - they recommend: aspirin 325 mg, MRI brain, MRA head and neck -  medical admission with consultation for stroke evaluation   After the interventions noted above, I reevaluated the patient and found that they have: stayed the same  Dispostion:  After consideration of the diagnostic results and the patients response to treatment, I feel that the patent would benefit from medical admission for stroke evaluation.            Final Clinical Impression(s) / ED Diagnoses Final diagnoses:  Cerebrovascular accident (CVA), unspecified mechanism [facility])    Rx / DC Orders ED Discharge Orders     None         Timica Marcom, Carola Rhine, MD 02/19/21 [phone]

## 2021-02-19 NOTE — ED Triage Notes (Signed)
Pt reports increase aphasia x 2 days ago.   Hx: HTN, DM,

## 2021-02-19 NOTE — ED Notes (Signed)
Pt back from MRI at this time

## 2021-02-19 NOTE — ED Provider Triage Note (Signed)
Emergency Medicine Provider Triage Evaluation Note  Joe Stewart , a 66 y.o. male  was evaluated in triage.  Pt complains of resolved "feeling like he was talking into a tunnel."  He had been not been taking his meds for 6 months but started retaking some 4 days ago.  He isn't sure which ones he started stating "I am having a hard time remembering."   Review of Systems  Positive: See above Negative: See above  Physical Exam  BP (!) 163/81 (BP Location: Right Arm)    Pulse 88    Temp 98.8 F (37.1 C) (Oral)    Resp 18    SpO2 98%  Gen:   Awake, no distress   Resp:  Normal effort  MSK:   Moves extremities without difficulty  Other:  No facial droop.  Speech is not slurred.  Patient does appear to have some slight difficulty with remembering things such as stating he cannot remember his doctors names or the medications even when I tell him the names of them.  He does not have apparent word finding difficulty.  No evidence of receptive aphasia.  Facial movements are symmetric.  Moves all 4 extremities without obvious difficulty.  Medical Decision Making  Medically screening exam initiated at 1:32 PM.  Appropriate orders placed.  Joe Stewart was informed that the remainder of the evaluation will be completed by another provider, this initial triage assessment does not replace that evaluation, and the importance of remaining in the ED until their evaluation is complete.  Patient's symptoms started 2 days ago, is outside any stroke window.  Suspect TIA versus stroke given he has questionable ongoing memory deficits.  Labs are ordered, CT head ordered.Note: Portions of this report may have been transcribed using voice recognition software. Every effort was made to ensure accuracy; however, inadvertent computerized transcription errors may be present    Lorin Glass, PA-C 02/19/21 1342

## 2021-02-19 NOTE — ED Notes (Addendum)
Error

## 2021-02-19 NOTE — H&P (Signed)
[facility] History and Physical  Davarion Cuffee PXT:[id number] DOB: 11-27-1955 DOA: 02/19/2021   PCP: [name], NP  Specialists: Followed by Dr. Terri Skains with [facility]  Chief Complaint: Vision impairment and speech difficulties for 2 days  HPI: Rylend Pietrzak is a 66 y.o. male with a past medical history of hypertension and diabetes who was in his usual state of health about 2 days ago when he developed a headache.  He also reported vision impairment with blurred vision for the past few days as well.  His brother also reported patient has been a little confused.  The patient mentioned that he was having difficulty seeing in his right vision field.  Apparently was also noted to be confused with doing simple tasks such as closing the windows in the car and getting himself dressed as per the brother.  He was brought into the emergency department.  Patient lives by himself.  Currently denies any headache.  He was also noted to have difficulty speaking.  Patient mentioned that he was having difficulty finding his words.  Denies any chest pain shortness of breath.  Denies any previous history of similar episodes.  In the emergency department he underwent CT head which showed raised concern for an acute stroke.  Neurology was consulted.  He will be hospitalized for further management.  Home Medications: Prior to Admission medications   Medication Sig Start Date End Date Taking? Authorizing Provider  amLODipine (NORVASC) 10 MG tablet TAKE 1 TABLET (10 MG TOTAL) BY MOUTH IN THE MORNING. 03/13/20   Tolia, Sunit, DO  atorvastatin (LIPITOR) 40 MG tablet TAKE 1 TABLET BY MOUTH EVERY DAY 03/06/20   Tolia, Sunit, DO  [facility] ASPIRIN ADULT LOW DOSE 81 MG chewable tablet CHEW 1 TABLET (81 MG TOTAL) BY MOUTH DAILY. 03/22/20   Magnant, Charles L, PA-C  Cyanocobalamin (VITAMIN B-12 PO) Take 1 tablet by mouth daily.    [provider]  Ferrous Sulfate Dried (HIGH POTENCY IRON) 65 MG TABS Take  by mouth.    [provider]  hydrochlorothiazide (HYDRODIURIL) 25 MG tablet TAKE 1 TABLET (25 MG TOTAL) BY MOUTH IN THE MORNING. 03/05/20   Tolia, Sunit, DO  losartan (COZAAR) 25 MG tablet Take 25 mg by mouth daily. 02/09/20   [provider]  losartan (COZAAR) 50 MG tablet TAKE 1 TABLET BY MOUTH EVERY EVENING. 05/20/20   Tolia, Sunit, DO  magnesium oxide (MAG-OX) 400 MG tablet Take 1 tablet by mouth 2 (two) times daily. 02/11/20   [provider]  metFORMIN (GLUCOPHAGE) 1000 MG tablet Take 1,000 mg by mouth 2 (two) times daily. 04/14/20   [provider]  metoprolol succinate (TOPROL-XL) 25 MG 24 hr tablet TAKE 1 TABLET (25 MG TOTAL) BY MOUTH DAILY. TAKE WITH OR IMMEDIATELY FOLLOWING A MEAL. HOLD IF SYSTOLIC BLOOD PRESSURE (TOP BLOOD PRESSURE NUMBER) LESS THAN 100 MMHG OR HEART RATE LESS THAN 60 BPM (PULSE). 04/15/20 07/14/20  Tolia, Sunit, DO  omeprazole (PRILOSEC) 40 MG capsule TAKE 1 CAPSULE (40 MG TOTAL) BY MOUTH 2 (TWO) TIMES DAILY BEFORE A MEAL. 03/15/20 03/15/21  Mansouraty, Telford Nab., MD  sitaGLIPtin-metformin (JANUMET) 50-1000 MG tablet Take 1 tablet by mouth 2 (two) times daily with a meal. 01/05/20   [name], NP    Allergies: No Known Allergies  Past Medical History: Past Medical History:  Diagnosis Date   Arthritis    Cataract    Mixed OU   Diabetes mellitus without complication ([place])    Diabetic retinopathy ([place])  NPDR OU   Hyperlipidemia    Hypertension    Hypertensive retinopathy    OU   Obesity     Past Surgical History:  Procedure Laterality Date   BIOPSY  11/29/2019   Procedure: BIOPSY;  Surgeon: Irving Copas., MD;  Location: [facility];  Service: Gastroenterology;;   COLONOSCOPY WITH PROPOFOL N/A 11/29/2019   Procedure: COLONOSCOPY WITH PROPOFOL;  Surgeon: Irving Copas., MD;  Location: [facility];  Service: Gastroenterology;  Laterality: N/A;   ENDOSCOPIC MUCOSAL RESECTION N/A 11/29/2019   Procedure:  ENDOSCOPIC MUCOSAL RESECTION;  Surgeon: Rush Landmark Telford Nab., MD;  Location: [facility];  Service: Gastroenterology;  Laterality: N/A;   ESOPHAGOGASTRODUODENOSCOPY (EGD) WITH PROPOFOL N/A 11/29/2019   Procedure: ESOPHAGOGASTRODUODENOSCOPY (EGD) WITH PROPOFOL;  Surgeon: Rush Landmark Telford Nab., MD;  Location: [facility];  Service: Gastroenterology;  Laterality: N/A;   EYE SURGERY Left    had to have eye flushed after getting costic sodium in L eye   HEMOSTASIS CLIP PLACEMENT  11/29/2019   Procedure: HEMOSTASIS CLIP PLACEMENT;  Surgeon: Irving Copas., MD;  Location: [facility];  Service: Gastroenterology;;   HERNIA REPAIR     HERNIA REPAIR     35 years ago   HOT HEMOSTASIS N/A 11/29/2019   Procedure: HOT HEMOSTASIS (ARGON PLASMA COAGULATION/BICAP);  Surgeon: Irving Copas., MD;  Location: [facility];  Service: Gastroenterology;  Laterality: N/A;   LEFT HEART CATH AND CORONARY ANGIOGRAPHY N/A 04/11/2019   Procedure: LEFT HEART CATH AND CORONARY ANGIOGRAPHY;  Surgeon: Adrian Prows, MD;  Location: [facility];  Service: Cardiovascular;  Laterality: N/A;   POLYPECTOMY  11/29/2019   Procedure: POLYPECTOMY;  Surgeon: Rush Landmark Telford Nab., MD;  Location: [facility];  Service: Gastroenterology;;   RENAL ANGIOGRAPHY Bilateral 04/11/2019   Procedure: RENAL ANGIOGRAPHY;  Surgeon: Adrian Prows, MD;  Location: [facility];  Service: Cardiovascular;  Laterality: Bilateral;   SUBMUCOSAL LIFTING INJECTION  11/29/2019   Procedure: SUBMUCOSAL LIFTING INJECTION;  Surgeon: Irving Copas., MD;  Location: [facility];  Service: Gastroenterology;;   TOTAL KNEE ARTHROPLASTY Left 08/01/2019   Procedure: LEFT TOTAL KNEE ARTHROPLASTY;  Surgeon: Meredith Pel, MD;  Location: [facility];  Service: Orthopedics;  Laterality: Left;    Social History: Lives by himself.  No history of smoking.  Denies any alcohol use or illicit drug use.  Usually independent with daily  activities.  He is retired.  Family History:  Family History  Problem Relation Age of Onset   Diabetes Mother    COPD Mother    Cancer Mother        unknown type    Heart disease Father    COPD Sister    Healthy Brother    Heart disease Brother    Healthy Son    Colon cancer Neg Hx    Colon polyps Neg Hx    Esophageal cancer Neg Hx    Rectal cancer Neg Hx    Stomach cancer Neg Hx    Inflammatory bowel disease Neg Hx    Liver disease Neg Hx    Pancreatic cancer Neg Hx      Review of Systems - History obtained from the patient General ROS: positive for  - fatigue Psychological ROS: negative Ophthalmic ROS: positive for - blurry vision and decreased vision ENT ROS: negative Allergy and Immunology ROS: negative Hematological and Lymphatic ROS: negative Endocrine ROS: negative Respiratory ROS: no cough, shortness of breath, or wheezing Cardiovascular ROS: no chest pain or dyspnea on exertion Gastrointestinal  ROS: no abdominal pain, change in bowel habits, or black or bloody stools Genito-Urinary ROS: no dysuria, trouble voiding, or hematuria Musculoskeletal ROS: negative Neurological ROS: As in HPI Dermatological ROS: negative  Physical Examination  Vitals:   02/19/21 1315 02/19/21 1520 02/19/21 1533 02/19/21 1638  BP: (!) 163/81 129/74 (!) 138/55 117/88  Pulse: 88 86 86 92  Resp: 18 18 16 16   Temp: 98.8 F (37.1 C)     TempSrc: Oral     SpO2: 98% 98% 98% 97%    BP 117/88    Pulse 92    Temp 98.8 F (37.1 C) (Oral)    Resp 16    SpO2 97%   General appearance: alert, cooperative, appears stated age, and no distress Head: Normocephalic, without obvious abnormality, atraumatic Eyes: conjunctivae/corneas clear. PERRL, EOM's intact.  Throat: lips, mucosa, and tongue normal; teeth and gums normal Neck: no adenopathy, no carotid bruit, no JVD, supple, symmetrical, trachea midline, and thyroid not enlarged, symmetric, no tenderness/mass/nodules Resp: clear to  auscultation bilaterally Cardio: regular rate and rhythm, S1, S2 normal, no murmur, click, rub or gallop GI: soft, non-tender; bowel sounds normal; no masses,  no organomegaly Extremities: extremities normal, atraumatic, no cyanosis or edema Pulses: 2+ and symmetric Skin: Skin color, texture, turgor normal. No rashes or lesions Lymph nodes: Cervical, supraclavicular, and axillary nodes normal. Neurologic: Patient is alert.  Took some time to give me orientation answers.  He knew he was in [facility].  He got the year and the month correctly.  Oriented to person.  No obvious facial asymmetry noted.  No pronator drift.  Finger-to-nose was normal.  No dysdiadochokinesis.  No obvious motor deficits noted.   Labs on Admission: I have personally reviewed following labs and imaging studies  CBC: Recent Labs  Lab 02/19/21 1345 02/19/21 1403  WBC 7.4  --   NEUTROABS 4.2  --   HGB 14.6 14.6  HCT 42.5 43.0  MCV 90.6  --   PLT 219  --    Basic Metabolic Panel: Recent Labs  Lab 02/19/21 1345 02/19/21 1403  NA 135 135  K 4.4 4.4  CL 99 100  CO2 25  --   GLUCOSE 304* 307*  BUN 18 21  CREATININE 1.06 1.00  CALCIUM 9.4  --    GFR: CrCl cannot be calculated (Unknown ideal weight.). Liver Function Tests: Recent Labs  Lab 02/19/21 1345  AST 36  ALT 32  ALKPHOS 69  BILITOT 0.7  PROT 7.7  ALBUMIN 3.8    Coagulation Profile: Recent Labs  Lab 02/19/21 1345  INR 1.1      Radiological Exams on Admission: CT HEAD WO CONTRAST  Result Date: 02/19/2021 CLINICAL DATA:  TIA.  Aphasia. EXAM: CT HEAD WITHOUT CONTRAST TECHNIQUE: Contiguous axial images were obtained from the base of the skull through the vertex without intravenous contrast. RADIATION DOSE REDUCTION: This exam was performed according to the departmental dose-optimization program which includes automated exposure control, adjustment of the mA and/or kV according to patient size and/or use of iterative  reconstruction technique. COMPARISON:  None. FINDINGS: Brain: There is hypoattenuation involving cortex and white matter in the posterior mesial left temporal lobe. No intracranial hemorrhage, midline shift, or extra-axial fluid collection is identified. The ventricles are normal in size. Vascular: Calcified atherosclerosis at the skull base. No hyperdense vessel. Skull: No fracture or suspicious osseous lesion. Sinuses/Orbits: Mild mucosal thickening in the right maxillary sinus. Clear mastoid air cells. Unremarkable orbits. Other: None. IMPRESSION: 1.  Left temporal lobe hypodensity most suggestive of an acute or subacute PCA infarct. Brain MRI is recommended for further evaluation. 2. No intracranial hemorrhage. Electronically Signed   By: Logan Bores M.D.   On: 02/19/2021 14:17    My interpretation of Electrocardiogram: Sinus rhythm in the 90s.  Normal axis.  Right bundle branch block is noted.  No concerning ST segment changes.  Similar to previous EKG.   Problem List  Principal Problem:   Acute CVA (cerebrovascular accident) [facility]) Active Problems:   Essential hypertension   Type 2 diabetes mellitus without complication, without long-term current use of insulin (HCC)   Hyperlipidemia   Assessment: This is 66 year old Caucasian male with past medical history as stated earlier who comes in with few day history of blurry vision headache and some confusion referred by his family members.  CT scan raises concern for acute stroke.  Patient has risk factors including hypertension and diabetes.  Plan:   * Acute CVA (cerebrovascular accident) ([place])- (present on admission) CT head raises concern for left temporal lobe infarct.  No hemorrhage was noted.  Patient's symptoms have been ongoing for about 2 days now.  MRI brain is pending.  Neurology has been consulted by ED provider.  Check a lipid panel and HbA1c.  EKG shows sinus rhythm though it does show right bundle branch block.  Follow-up on  echocardiogram.  Aspirin.  Permissive hypertension.  PT OT SLP evaluation.  Further management per neurology.  Essential hypertension- (present on admission) Permissive hypertension will be allowed.  Home medication list has not been reconciled yet.  The medication list from 2021 suggests that he is on amlodipine, hydrochlorothiazide and losartan along with metoprolol. Based on previous records he is followed by [facility].  He has known right bundle branch block.  Had a cardiac cath in 2021 which showed diffuse disease but was being managed medically.  Did not require any PCI.  Does not take aspirin on a daily basis per patient.  Type 2 diabetes mellitus without complication, without long-term current use of insulin (HCC) Noted to be on oral agents including metformin.  Unclear if he is on Sitagliptin as well or not.  Glucose levels are significantly elevated probably suggesting noncompliance.  Will follow-up with HbA1c.  SSI.  Hyperlipidemia- (present on admission) Check lipid panel.  Supposed to be on Lipitor.    DVT Prophylaxis: Lovenox Code Status: Full code Family Communication: Discussed with patient.  No family at bedside Disposition: Hopefully return home in improved Consults called: Neurology Admission Status: Status is: Observation  The patient remains OBS appropriate and will d/c before 2 midnights.     Severity of Illness: The appropriate patient status for this patient is OBSERVATION. Observation status is judged to be reasonable and necessary in order to provide the required intensity of service to ensure the patient's safety. The patient's presenting symptoms, physical exam findings, and initial radiographic and laboratory data in the context of their medical condition is felt to place them at decreased risk for further clinical deterioration. Furthermore, it is anticipated that the patient will be medically stable for discharge from the hospital within 2  midnights of admission.    Further management decisions will depend on results of further testing and patient's response to treatment.   Ronella Plunk [employer]  [facility] operational officer on [email or web address]  02/19/2021, 5:06 PM

## 2021-02-19 NOTE — ED Notes (Signed)
Patient transported to MRI 

## 2021-02-20 ENCOUNTER — Observation Stay (HOSPITAL_COMMUNITY): Payer: Medicare Other

## 2021-02-20 DIAGNOSIS — Z9114 Patient's other noncompliance with medication regimen: Secondary | ICD-10-CM | POA: Diagnosis not present

## 2021-02-20 DIAGNOSIS — I6389 Other cerebral infarction: Secondary | ICD-10-CM | POA: Diagnosis not present

## 2021-02-20 DIAGNOSIS — E1165 Type 2 diabetes mellitus with hyperglycemia: Secondary | ICD-10-CM | POA: Diagnosis present

## 2021-02-20 DIAGNOSIS — E7800 Pure hypercholesterolemia, unspecified: Secondary | ICD-10-CM | POA: Diagnosis present

## 2021-02-20 DIAGNOSIS — Z6839 Body mass index (BMI) 39.0-39.9, adult: Secondary | ICD-10-CM | POA: Diagnosis not present

## 2021-02-20 DIAGNOSIS — I63432 Cerebral infarction due to embolism of left posterior cerebral artery: Secondary | ICD-10-CM | POA: Diagnosis present

## 2021-02-20 DIAGNOSIS — R4701 Aphasia: Secondary | ICD-10-CM | POA: Diagnosis present

## 2021-02-20 DIAGNOSIS — N179 Acute kidney failure, unspecified: Secondary | ICD-10-CM | POA: Diagnosis not present

## 2021-02-20 DIAGNOSIS — Z7984 Long term (current) use of oral hypoglycemic drugs: Secondary | ICD-10-CM | POA: Diagnosis not present

## 2021-02-20 DIAGNOSIS — Z79899 Other long term (current) drug therapy: Secondary | ICD-10-CM | POA: Diagnosis not present

## 2021-02-20 DIAGNOSIS — I4510 Unspecified right bundle-branch block: Secondary | ICD-10-CM | POA: Diagnosis present

## 2021-02-20 DIAGNOSIS — Z96652 Presence of left artificial knee joint: Secondary | ICD-10-CM | POA: Diagnosis present

## 2021-02-20 DIAGNOSIS — E785 Hyperlipidemia, unspecified: Secondary | ICD-10-CM | POA: Diagnosis not present

## 2021-02-20 DIAGNOSIS — Z809 Family history of malignant neoplasm, unspecified: Secondary | ICD-10-CM | POA: Diagnosis not present

## 2021-02-20 DIAGNOSIS — Z20822 Contact with and (suspected) exposure to covid-19: Secondary | ICD-10-CM | POA: Diagnosis present

## 2021-02-20 DIAGNOSIS — E119 Type 2 diabetes mellitus without complications: Secondary | ICD-10-CM | POA: Diagnosis not present

## 2021-02-20 DIAGNOSIS — I10 Essential (primary) hypertension: Secondary | ICD-10-CM | POA: Diagnosis not present

## 2021-02-20 DIAGNOSIS — H538 Other visual disturbances: Secondary | ICD-10-CM | POA: Diagnosis present

## 2021-02-20 DIAGNOSIS — Z7982 Long term (current) use of aspirin: Secondary | ICD-10-CM | POA: Diagnosis not present

## 2021-02-20 DIAGNOSIS — Z8249 Family history of ischemic heart disease and other diseases of the circulatory system: Secondary | ICD-10-CM | POA: Diagnosis not present

## 2021-02-20 DIAGNOSIS — I639 Cerebral infarction, unspecified: Secondary | ICD-10-CM | POA: Diagnosis not present

## 2021-02-20 DIAGNOSIS — M1990 Unspecified osteoarthritis, unspecified site: Secondary | ICD-10-CM | POA: Diagnosis present

## 2021-02-20 DIAGNOSIS — E669 Obesity, unspecified: Secondary | ICD-10-CM | POA: Diagnosis present

## 2021-02-20 DIAGNOSIS — Z23 Encounter for immunization: Secondary | ICD-10-CM | POA: Diagnosis not present

## 2021-02-20 DIAGNOSIS — Z833 Family history of diabetes mellitus: Secondary | ICD-10-CM | POA: Diagnosis not present

## 2021-02-20 DIAGNOSIS — R29701 NIHSS score 1: Secondary | ICD-10-CM | POA: Diagnosis present

## 2021-02-20 DIAGNOSIS — Z825 Family history of asthma and other chronic lower respiratory diseases: Secondary | ICD-10-CM | POA: Diagnosis not present

## 2021-02-20 LAB — CBC
HCT: 40.1 % (ref 39.0–52.0)
Hemoglobin: 14 g/dL (ref 13.0–17.0)
MCH: 31.4 pg (ref 26.0–34.0)
MCHC: 34.9 g/dL (ref 30.0–36.0)
MCV: 89.9 fL (ref 80.0–100.0)
Platelets: 201 10*3/uL (ref 150–400)
RBC: 4.46 MIL/uL (ref 4.22–5.81)
RDW: 12.6 % (ref 11.5–15.5)
WBC: 7.5 10*3/uL (ref 4.0–10.5)
nRBC: 0 % (ref 0.0–0.2)

## 2021-02-20 LAB — COMPREHENSIVE METABOLIC PANEL
ALT: 29 U/L (ref 0–44)
AST: 33 U/L (ref 15–41)
Albumin: 3.4 g/dL — ABNORMAL LOW (ref 3.5–5.0)
Alkaline Phosphatase: 64 U/L (ref 38–126)
Anion gap: 9 (ref 5–15)
BUN: 15 mg/dL (ref 8–23)
CO2: 28 mmol/L (ref 22–32)
Calcium: 8.9 mg/dL (ref 8.9–10.3)
Chloride: 100 mmol/L (ref 98–111)
Creatinine, Ser: 1.01 mg/dL (ref 0.61–1.24)
GFR, Estimated: >60 mL/min (ref >60)
Glucose, Bld: 162 mg/dL — ABNORMAL HIGH (ref 70–99)
Potassium: 3.9 mmol/L (ref 3.5–5.1)
Sodium: 137 mmol/L (ref 135–145)
Total Bilirubin: 0.6 mg/dL (ref 0.3–1.2)
Total Protein: 7 g/dL (ref 6.5–8.1)

## 2021-02-20 LAB — GLUCOSE, CAPILLARY
Glucose-Capillary: 143 mg/dL — ABNORMAL HIGH (ref 70–99)
Glucose-Capillary: 181 mg/dL — ABNORMAL HIGH (ref 70–99)
Glucose-Capillary: 217 mg/dL — ABNORMAL HIGH (ref 70–99)
Glucose-Capillary: 186 mg/dL — ABNORMAL HIGH (ref 70–99)
Glucose-Capillary: 164 mg/dL — ABNORMAL HIGH (ref 70–99)

## 2021-02-20 LAB — HIV ANTIBODY (ROUTINE TESTING W REFLEX): HIV Screen 4th Generation wRfx: NONREACTIVE

## 2021-02-20 LAB — ECHOCARDIOGRAM COMPLETE
AR max vel: 3.62 cm2
AV Area VTI: 3.69 cm2
AV Area mean vel: 3.41 cm2
AV Mean grad: 5 mmHg
AV Peak grad: 8.8 mmHg
Ao pk vel: 1.48 m/s
Area-P 1/2: 3.08 cm2
Calc EF: 62.2 %
S' Lateral: 2.6 cm
Single Plane A2C EF: 63.7 %
Single Plane A4C EF: 62.6 %

## 2021-02-20 LAB — HEMOGLOBIN A1C
Hgb A1c MFr Bld: 12.3 % — ABNORMAL HIGH (ref 4.8–5.6)
Mean Plasma Glucose: 306.31 mg/dL

## 2021-02-20 LAB — LIPID PANEL
Cholesterol: 234 mg/dL — ABNORMAL HIGH (ref 0–200)
HDL: 27 mg/dL — ABNORMAL LOW (ref >40)
LDL Cholesterol: UNDETERMINED mg/dL (ref 0–99)
Total CHOL/HDL Ratio: 8.7 RATIO
Triglycerides: 410 mg/dL — ABNORMAL HIGH (ref <150)
VLDL: UNDETERMINED mg/dL (ref 0–40)

## 2021-02-20 LAB — TSH: TSH: 3.683 u[IU]/mL (ref 0.350–4.500)

## 2021-02-20 LAB — LDL CHOLESTEROL, DIRECT: Direct LDL: 124.9 mg/dL — ABNORMAL HIGH (ref 0–99)

## 2021-02-20 MED ORDER — CLOPIDOGREL BISULFATE 75 MG PO TABS
75.0000 mg | ORAL_TABLET | Freq: Every day | ORAL | Status: DC
  Administered 2021-02-20 – 2021-02-25 (×6): 75 mg via ORAL
  Filled 2021-02-20 (×6): qty 1

## 2021-02-20 MED ORDER — METOPROLOL SUCCINATE ER 25 MG PO TB24: 25.0000 mg | ORAL_TABLET | Freq: Every day | ORAL | 2 refills | Status: DC

## 2021-02-20 MED ORDER — ATORVASTATIN CALCIUM 80 MG PO TABS: 80.0000 mg | ORAL_TABLET | Freq: Every day | ORAL | 2 refills | Status: DC

## 2021-02-20 MED ORDER — LOSARTAN POTASSIUM 50 MG PO TABS: 50.0000 mg | ORAL_TABLET | Freq: Every evening | ORAL | 2 refills | Status: DC

## 2021-02-20 MED ORDER — ASPIRIN EC 81 MG PO TBEC: 81.0000 mg | DELAYED_RELEASE_TABLET | Freq: Every day | ORAL | 11 refills | Status: DC

## 2021-02-20 MED ORDER — JANUMET 50-1000 MG PO TABS: 1.0000 | ORAL_TABLET | Freq: Two times a day (BID) | ORAL | 2 refills | Status: DC

## 2021-02-20 MED ORDER — PNEUMOCOCCAL VAC POLYVALENT 25 MCG/0.5ML IJ INJ
0.5000 mL | INJECTION | INTRAMUSCULAR | Status: AC
  Administered 2021-02-21: 0.5 mL via INTRAMUSCULAR
  Filled 2021-02-20: qty 0.5

## 2021-02-20 MED ORDER — CLOPIDOGREL BISULFATE 75 MG PO TABS: 75.0000 mg | ORAL_TABLET | Freq: Every day | ORAL | 0 refills | Status: AC

## 2021-02-20 MED ORDER — INFLUENZA VAC A&B SA ADJ QUAD 0.5 ML IM PRSY
0.5000 mL | PREFILLED_SYRINGE | INTRAMUSCULAR | Status: AC
  Administered 2021-02-21: 0.5 mL via INTRAMUSCULAR
  Filled 2021-02-20: qty 0.5

## 2021-02-20 MED ORDER — ATORVASTATIN CALCIUM 80 MG PO TABS
80.0000 mg | ORAL_TABLET | Freq: Every day | ORAL | Status: DC
  Administered 2021-02-20 – 2021-02-25 (×6): 80 mg via ORAL
  Filled 2021-02-20 (×6): qty 1

## 2021-02-20 NOTE — Evaluation (Signed)
Physical Therapy Evaluation Patient Details Name: Joe Stewart MRN: [id number] DOB: 02/21/1955 Today's Date: 02/20/2021  History of Present Illness  Joe Stewart is a 66 y.o. male presented emergency department 1/18 with visual deficit and confusion for 1 week.  Patient reports that he developed a headache, on the left temporal region,  which is resolved.  He said he felt woozy and lightheaded.  He noted that his vision seemed off, with missing pieces in his visual field at least 2 to 3 days a He lives by himself. His brother  reported that the patient seems confused with simple tasks today, such as closing the windows in the car, and also getting himself dressed.  PMHx:  DM, diabetic retinopathy, HTN  Clinical Impression  Pt admitted with/for s/s of stroke as stated above.  Initially pt needed min guard to min assist for gait, but with muscle and activity fatigue, pt's mobility and gait degraded significantly .  Pt currently limited functionally due to the problems listed. ( See problems list.)   Pt will benefit from PT to maximize function and safety in order to get ready for next venue listed below.        Recommendations for follow up therapy are one component of a multi-disciplinary discharge planning process, led by the attending physician.  Recommendations may be updated based on patient status, additional functional criteria and insurance authorization.  Follow Up Recommendations Acute inpatient rehab (3hours/day)    Assistance Recommended at Discharge Intermittent Supervision/Assistance  Patient can return home with the following  A little help with walking and/or transfers;A little help with bathing/dressing/bathroom;Assistance with cooking/housework;Assist for transportation;Help with stairs or ramp for entrance    Equipment Recommendations Other (comment) (TBA,)  Recommendations for Other Services  Rehab consult    Functional Status Assessment Patient has had a recent decline in  their functional status and demonstrates the ability to make significant improvements in function in a reasonable and predictable amount of time.     Precautions / Restrictions Precautions Precautions: Fall Restrictions Weight Bearing Restrictions: No      Mobility  Bed Mobility Overal bed mobility: Needs Assistance Bed Mobility: Supine to Sit, Sit to Supine     Supine to sit: Modified independent (Device/Increase time) Sit to supine: Supervision   General bed mobility comments: Initially thought mod I, but pt degraded functional so much with fatigue that he needed rest EOB to attempt unassisted return to supine.    Transfers Overall transfer level: Needs assistance Equipment used: 1 person hand held assist Transfers: Sit to/from Stand Sit to Stand: Min guard           General transfer comment: Initially, pt stood with min guard using UE's appropriately with mild balance deviation in standing.    Ambulation/Gait Ambulation/Gait assistance: Min assist, Mod assist, Max assist (Initially ambulated with  min assist due to some coordination issues with R side, thought mainly in R LE, but later seen with R UE as pt fatigue with significant degradation in balance and gait stability/coordination) Gait Distance (Feet): 75 Feet (x2 with standing "regroup" and significant degradation on return needing mod to max assist for control of balance and gait stability.)   Gait Pattern/deviations: Step-through pattern   Gait velocity interpretation: <1.31 ft/sec, indicative of household ambulator   General Gait Details: initially step through with mildly uncoordinated R LE and digressed to significantly uncoordinated balance and gait with R LE with staggering and falling off to the right,  Notable change with function of R  UE on return to the room.  Stairs            Wheelchair Mobility    Modified Rankin (Stroke Patients Only) Modified Rankin (Stroke Patients Only) Pre-Morbid  Rankin Score: No symptoms Modified Rankin: Moderately severe disability     Balance Overall balance assessment: Needs assistance   Sitting balance-Leahy Scale: Fair (to good initiall with degradation with fatigue) Sitting balance - Comments: Joe to sit EOB without assist even after degradation of function   Standing balance support: No upper extremity supported, During functional activity Standing balance-Leahy Scale: Fair Standing balance comment: completed BERG initially, but significant degradation with muscle fatigue.                 Standardized Balance Assessment Standardized Balance Assessment : Berg Balance Test Berg Balance Test Sit to Stand: Joe to stand  independently using hands Standing Unsupported: Joe to stand 2 minutes with supervision Sitting with Back Unsupported but Feet Supported on Floor or Stool: Joe to sit 2 minutes under supervision Stand to Sit: Controls descent by using hands Transfers: Joe to transfer safely, definite need of hands Standing Unsupported with Eyes Closed: Joe to stand 10 seconds with supervision Standing Ubsupported with Feet Together: Joe to place feet together independently but unable to hold for 30 seconds From Standing, Reach Forward with Outstretched Arm: Can reach forward >12 cm safely (5") From Standing Position, Pick up Object from Floor: Joe to pick up shoe, needs supervision From Standing Position, Turn to Look Behind Over each Shoulder: Looks behind one side only/other side shows less weight shift Turn 360 Degrees: Joe to turn 360 degrees safely in 4 seconds or less Standing Unsupported, Alternately Place Feet on Step/Stool: Needs assistance to keep from falling or unable to try Standing Unsupported, One Foot in Front: Joe to plae foot ahead of the other independently and hold 30 seconds Standing on One Leg: Joe to lift leg independently and hold equal to or more than 3 seconds Total Score: 38          Pertinent Vitals/Pain      Home Living Family/patient expects to be discharged to:: Private residence Living Arrangements: Alone Available Help at Discharge: Family;Friend(s) Type of Home: House Home Access: Stairs to enter Entrance Stairs-Rails: Left Entrance Stairs-Number of Steps: 2   Home Layout: One level Home Equipment: Marine scientist - single point      Prior Function Prior Level of Function : Independent/Modified Independent                     Hand Dominance   Dominant Hand: Right    Extremity/Trunk Assessment   Upper Extremity Assessment Upper Extremity Assessment: Overall WFL for tasks assessed    Lower Extremity Assessment Lower Extremity Assessment: RLE deficits/detail RLE Deficits / Details: incoordination and mild weakness R LE, degrading with activity. RLE Coordination: decreased fine motor    Cervical / Trunk Assessment Cervical / Trunk Assessment: Normal  Communication   Communication: No difficulties  Cognition Arousal/Alertness: Awake/alert Behavior During Therapy: WFL for tasks assessed/performed Overall Cognitive Status: Within Functional Limits for tasks assessed                                          General Comments      Exercises     Assessment/Plan    PT Assessment Patient needs continued PT services  PT Problem List Decreased strength;Decreased activity tolerance;Decreased balance;Decreased mobility;Decreased coordination;Decreased knowledge of use of DME       PT Treatment Interventions DME instruction;Gait training;Stair training;Functional mobility training;Therapeutic activities;Balance training;Neuromuscular re-education;Patient/family education    PT Goals (Current goals can be found in the Care Plan section)  Acute Rehab PT Goals Patient Stated Goal: I need to be independent again PT Goal Formulation: With patient Time For Goal Achievement: 03/06/21 Potential to Achieve Goals:  Good    Frequency Min 3X/week     Co-evaluation               AM-PAC PT "6 Clicks" Mobility  Outcome Measure Help needed turning from your back to your side while in a flat bed without using bedrails?: A Little Help needed moving from lying on your back to sitting on the side of a flat bed without using bedrails?: A Little Help needed moving to and from a bed to a chair (including a wheelchair)?: A Lot Help needed standing up from a chair using your arms (e.g., wheelchair or bedside chair)?: A Lot Help needed to walk in hospital room?: A Lot Help needed climbing 3-5 steps with a railing? : A Lot 6 Click Score: 14    End of Session   Activity Tolerance: Patient limited by fatigue Patient left: in bed;with call bell/phone within reach;with bed alarm set Nurse Communication: Mobility status PT Visit Diagnosis: Unsteadiness on feet (R26.81);Other abnormalities of gait and mobility (R26.89);Muscle weakness (generalized) (M62.81);Difficulty in walking, not elsewhere classified (R26.2);Other symptoms and signs involving the nervous system (R29.898)    Time: 1517-6160 PT Time Calculation (min) (ACUTE ONLY): 39 min   Charges:   PT Evaluation $PT Eval Moderate Complexity: 1 Mod PT Treatments $Gait Training: 8-22 mins $Therapeutic Activity: 8-22 mins        02/20/2021  Ginger Carne., PT Acute Rehabilitation Services [phone]  (pager) [phone]  (office)  Tessie Fass Anissia Wessells 02/20/2021, 9:12 PM

## 2021-02-20 NOTE — Progress Notes (Signed)
°  Echocardiogram 2D Echocardiogram has been performed.  Bobbye Charleston 02/20/2021, 8:43 AM

## 2021-02-20 NOTE — Progress Notes (Addendum)
STROKE TEAM PROGRESS NOTE   INTERVAL HISTORY His friend is at the bedside.  Patient reports having some mild blurry vision and tingling in left temple. Friend states he observed patient was very confused with basic tasks including rolling up car windows and putting on clothes since 2 days ago. Since being hospitalized, patient denies any emergent neurological deficits but does still state right peripheral field deficits and confusion. Patient state he has not taken any medications for the past 6 months.   CT Head shows left temporal lobe hypodensity most suggestive of an acute or subacute PCA infarct. MRI shows  Acute infarct in the posterior mesial temporal lobe, with additional scattered infarcts in the posterior left thalamus and left occipital lobe, consistent with left PCA territory infarct. MRA shows poor visualization of the left PCA, with non opacification of the proximal left P2, as well as P3 segments. Poor opacification of the left P1 and more distal P2 segment. Echo shows EF 70-75% with concentric LV hypertrophy. Direct LDL 124.9, A1c 12.3   Vitals:   02/20/21 0106 02/20/21 0333 02/20/21 0400 02/20/21 0743  BP: (!) 177/90 (!) 155/81  (!) 162/79  Pulse: 78 72  78  Resp: 20 12 16 20   Temp: 98.1 F (36.7 C) 98.1 F (36.7 C)  98.7 F (37.1 C)  TempSrc: Oral Oral  Oral  SpO2: 99% 98%  97%   CBC:  Recent Labs  Lab 02/19/21 1345 02/19/21 1403 02/20/21 0455  WBC 7.4  --  7.5  NEUTROABS 4.2  --   --   HGB 14.6 14.6 14.0  HCT 42.5 43.0 40.1  MCV 90.6  --  89.9  PLT 219  --  073   Basic Metabolic Panel:  Recent Labs  Lab 02/19/21 1345 02/19/21 1403 02/20/21 0455  NA 135 135 137  K 4.4 4.4 3.9  CL 99 100 100  CO2 25  --  28  GLUCOSE 304* 307* 162*  BUN 18 21 15   CREATININE 1.06 1.00 1.01  CALCIUM 9.4  --  8.9   Lipid Panel:  Recent Labs  Lab 02/20/21 0455  CHOL 234*  TRIG 410*  HDL 27*  CHOLHDL 8.7  VLDL UNABLE TO CALCULATE IF TRIGLYCERIDE OVER 400 mg/dL   LDLCALC UNABLE TO CALCULATE IF TRIGLYCERIDE OVER 400 mg/dL   HgbA1c:  Recent Labs  Lab 02/20/21 0455  HGBA1C 12.3*   Urine Drug Screen:  Recent Labs  Lab 02/19/21 1915  LABOPIA NONE DETECTED  COCAINSCRNUR NONE DETECTED  LABBENZ NONE DETECTED  AMPHETMU NONE DETECTED  THCU NONE DETECTED  LABBARB NONE DETECTED    Alcohol Level  Recent Labs  Lab 02/19/21 1345  ETH <10    IMAGING past 24 hours CT HEAD WO CONTRAST  Result Date: 02/19/2021 CLINICAL DATA:  TIA.  Aphasia. EXAM: CT HEAD WITHOUT CONTRAST TECHNIQUE: Contiguous axial images were obtained from the base of the skull through the vertex without intravenous contrast. RADIATION DOSE REDUCTION: This exam was performed according to the departmental dose-optimization program which includes automated exposure control, adjustment of the mA and/or kV according to patient size and/or use of iterative reconstruction technique. COMPARISON:  None. FINDINGS: Brain: There is hypoattenuation involving cortex and white matter in the posterior mesial left temporal lobe. No intracranial hemorrhage, midline shift, or extra-axial fluid collection is identified. The ventricles are normal in size. Vascular: Calcified atherosclerosis at the skull base. No hyperdense vessel. Skull: No fracture or suspicious osseous lesion. Sinuses/Orbits: Mild mucosal thickening in the right maxillary sinus.  Clear mastoid air cells. Unremarkable orbits. Other: None. IMPRESSION: 1. Left temporal lobe hypodensity most suggestive of an acute or subacute PCA infarct. Brain MRI is recommended for further evaluation. 2. No intracranial hemorrhage. Electronically Signed   By: Logan Bores M.D.   On: 02/19/2021 14:17   MR ANGIO HEAD WO CONTRAST  Result Date: 02/19/2021 CLINICAL DATA:  Stroke, TIA, determine embolic source; headache and vision impairment with 2 days of blurred vision EXAM: MRI HEAD WITHOUT CONTRAST MRA HEAD WITHOUT CONTRAST MRA NECK WITHOUT AND WITH CONTRAST  TECHNIQUE: Multiplanar, multi-echo pulse sequences of the brain and surrounding structures were acquired without intravenous contrast. Angiographic images of the Circle of Willis were acquired using MRA technique without intravenous contrast. Angiographic images of the neck were acquired using MRA technique without and with intravenous contrast. Carotid stenosis measurements (when applicable) are obtained utilizing NASCET criteria, using the distal internal carotid diameter as the denominator. CONTRAST:  81mL GADAVIST GADOBUTROL 1 MMOL/ML IV SOLN COMPARISON:  No prior MRI or MRA. Correlation is made with CT head 02/19/2021 FINDINGS: MRI HEAD FINDINGS Brain: Restricted diffusion with ADC correlate in the mesial left temporal lobe, measuring up to 4.3 x 2.2 x 2.2 cm (series 5, image 73 and series 7, image 48), which correlates with the hypodensity seen on the same-day CT. Additional scattered foci of restricted diffusion with ADC correlates in the left occipital lobe (series 5, image 75 and 76) as well as in the posterior left thalamus (series 5, image 77). No acute hemorrhage, mass, mass effect, or midline shift. Mildly increased T2 signal is associated with the previously mentioned areas of restricted diffusion, likely mild edema. No hydrocephalus or extra-axial collection. Vascular: Please see MRA findings below Skull and upper cervical spine: Normal marrow signal. Possible mild spinal canal stenosis C2-C3. Sinuses/Orbits: No acute or significant finding. Other: The mastoids are well aerated. MRA HEAD FINDINGS Anterior circulation: Both internal carotid arteries are patent to the termini, without stenosis or other abnormality. A1 segments patent, with mild focal narrowing in the mid right A1 (series 1, image 101). Normal anterior communicating artery. Anterior cerebral arteries are patent to their distal aspects. No M1 stenosis or occlusion. Normal MCA bifurcations. Distal MCA branches perfused and symmetric.  Posterior circulation: Vertebral arteries patent to the vertebrobasilar junction without stenosis. Right dominant. Basilar patent to its distal aspect. Superior cerebellar arteries patent bilaterally. Patent short right P1, with prominent right posterior communicating artery. The right PCA is well perfused. Poor visualization of the left P1, which likely shares a common origin with the left SCA (series 1, image 71) poor opacification of the P1 proximally (series 1, image 75-80), with patent left posterior communicating artery. Focal non opacification of the left P2 (series 1 images 85-89). Poor perfusion of the remainder of the left P2 (series 1, image 87), with nonvisualization of the left P3 segments. Anatomic variants: None significant MRA NECK FINDINGS Aortic arch: Three-vessel arch. No evidence of aneurysm, dissection, or significant proximal stenosis. Right carotid system: Patent, without significant stenosis, dissection, or occlusion. Left carotid system: Patent, without significant stenosis, dissection, or occlusion. Vertebral arteries: The right vertebral artery is visualized from its origin to the skull base. There is significant tortuosity in the midcervical spine (series 11, image 49). The left vertebral artery is diminutive and minimally visualized from just distal to its origin to the skull base, although it does appear patent. The origin of the left vertebral artery is not well evaluated, likely secondary to artifact. Other: None IMPRESSION: 1.  Acute infarct in the posterior mesial temporal lobe, with additional scattered infarcts in the posterior left thalamus and left occipital lobe, consistent with left PCA territory infarct. 2. Poor visualization of the left PCA, with non opacification of the proximal left P2, as well as P3 segments. Poor opacification of the left P1 and more distal P2 segment. No other significant intracranial stenosis. 3. Diminutive left vertebral artery, which is not dominant.  This may be congenital and or may represent atherosclerotic stenosis. No other significant stenosis in the neck. Code stroke imaging results were communicated on 02/19/2021 at 6:38 pm to provider Dr. Lorrin Goodell via secure text paging. Electronically Signed   By: Merilyn Baba M.D.   On: 02/19/2021 18:39   MR Angiogram Neck W or Wo Contrast  Result Date: 02/19/2021 CLINICAL DATA:  Stroke, TIA, determine embolic source; headache and vision impairment with 2 days of blurred vision EXAM: MRI HEAD WITHOUT CONTRAST MRA HEAD WITHOUT CONTRAST MRA NECK WITHOUT AND WITH CONTRAST TECHNIQUE: Multiplanar, multi-echo pulse sequences of the brain and surrounding structures were acquired without intravenous contrast. Angiographic images of the Circle of Willis were acquired using MRA technique without intravenous contrast. Angiographic images of the neck were acquired using MRA technique without and with intravenous contrast. Carotid stenosis measurements (when applicable) are obtained utilizing NASCET criteria, using the distal internal carotid diameter as the denominator. CONTRAST:  35mL GADAVIST GADOBUTROL 1 MMOL/ML IV SOLN COMPARISON:  No prior MRI or MRA. Correlation is made with CT head 02/19/2021 FINDINGS: MRI HEAD FINDINGS Brain: Restricted diffusion with ADC correlate in the mesial left temporal lobe, measuring up to 4.3 x 2.2 x 2.2 cm (series 5, image 73 and series 7, image 48), which correlates with the hypodensity seen on the same-day CT. Additional scattered foci of restricted diffusion with ADC correlates in the left occipital lobe (series 5, image 75 and 76) as well as in the posterior left thalamus (series 5, image 77). No acute hemorrhage, mass, mass effect, or midline shift. Mildly increased T2 signal is associated with the previously mentioned areas of restricted diffusion, likely mild edema. No hydrocephalus or extra-axial collection. Vascular: Please see MRA findings below Skull and upper cervical spine:  Normal marrow signal. Possible mild spinal canal stenosis C2-C3. Sinuses/Orbits: No acute or significant finding. Other: The mastoids are well aerated. MRA HEAD FINDINGS Anterior circulation: Both internal carotid arteries are patent to the termini, without stenosis or other abnormality. A1 segments patent, with mild focal narrowing in the mid right A1 (series 1, image 101). Normal anterior communicating artery. Anterior cerebral arteries are patent to their distal aspects. No M1 stenosis or occlusion. Normal MCA bifurcations. Distal MCA branches perfused and symmetric. Posterior circulation: Vertebral arteries patent to the vertebrobasilar junction without stenosis. Right dominant. Basilar patent to its distal aspect. Superior cerebellar arteries patent bilaterally. Patent short right P1, with prominent right posterior communicating artery. The right PCA is well perfused. Poor visualization of the left P1, which likely shares a common origin with the left SCA (series 1, image 71) poor opacification of the P1 proximally (series 1, image 75-80), with patent left posterior communicating artery. Focal non opacification of the left P2 (series 1 images 85-89). Poor perfusion of the remainder of the left P2 (series 1, image 87), with nonvisualization of the left P3 segments. Anatomic variants: None significant MRA NECK FINDINGS Aortic arch: Three-vessel arch. No evidence of aneurysm, dissection, or significant proximal stenosis. Right carotid system: Patent, without significant stenosis, dissection, or occlusion. Left carotid system: Patent,  without significant stenosis, dissection, or occlusion. Vertebral arteries: The right vertebral artery is visualized from its origin to the skull base. There is significant tortuosity in the midcervical spine (series 11, image 49). The left vertebral artery is diminutive and minimally visualized from just distal to its origin to the skull base, although it does appear patent. The  origin of the left vertebral artery is not well evaluated, likely secondary to artifact. Other: None IMPRESSION: 1. Acute infarct in the posterior mesial temporal lobe, with additional scattered infarcts in the posterior left thalamus and left occipital lobe, consistent with left PCA territory infarct. 2. Poor visualization of the left PCA, with non opacification of the proximal left P2, as well as P3 segments. Poor opacification of the left P1 and more distal P2 segment. No other significant intracranial stenosis. 3. Diminutive left vertebral artery, which is not dominant. This may be congenital and or may represent atherosclerotic stenosis. No other significant stenosis in the neck. Code stroke imaging results were communicated on 02/19/2021 at 6:38 pm to provider Dr. Lorrin Goodell via secure text paging. Electronically Signed   By: Merilyn Baba M.D.   On: 02/19/2021 18:39   MR BRAIN WO CONTRAST  Result Date: 02/19/2021 CLINICAL DATA:  Stroke, TIA, determine embolic source; headache and vision impairment with 2 days of blurred vision EXAM: MRI HEAD WITHOUT CONTRAST MRA HEAD WITHOUT CONTRAST MRA NECK WITHOUT AND WITH CONTRAST TECHNIQUE: Multiplanar, multi-echo pulse sequences of the brain and surrounding structures were acquired without intravenous contrast. Angiographic images of the Circle of Willis were acquired using MRA technique without intravenous contrast. Angiographic images of the neck were acquired using MRA technique without and with intravenous contrast. Carotid stenosis measurements (when applicable) are obtained utilizing NASCET criteria, using the distal internal carotid diameter as the denominator. CONTRAST:  81mL GADAVIST GADOBUTROL 1 MMOL/ML IV SOLN COMPARISON:  No prior MRI or MRA. Correlation is made with CT head 02/19/2021 FINDINGS: MRI HEAD FINDINGS Brain: Restricted diffusion with ADC correlate in the mesial left temporal lobe, measuring up to 4.3 x 2.2 x 2.2 cm (series 5, image 73 and  series 7, image 48), which correlates with the hypodensity seen on the same-day CT. Additional scattered foci of restricted diffusion with ADC correlates in the left occipital lobe (series 5, image 75 and 76) as well as in the posterior left thalamus (series 5, image 77). No acute hemorrhage, mass, mass effect, or midline shift. Mildly increased T2 signal is associated with the previously mentioned areas of restricted diffusion, likely mild edema. No hydrocephalus or extra-axial collection. Vascular: Please see MRA findings below Skull and upper cervical spine: Normal marrow signal. Possible mild spinal canal stenosis C2-C3. Sinuses/Orbits: No acute or significant finding. Other: The mastoids are well aerated. MRA HEAD FINDINGS Anterior circulation: Both internal carotid arteries are patent to the termini, without stenosis or other abnormality. A1 segments patent, with mild focal narrowing in the mid right A1 (series 1, image 101). Normal anterior communicating artery. Anterior cerebral arteries are patent to their distal aspects. No M1 stenosis or occlusion. Normal MCA bifurcations. Distal MCA branches perfused and symmetric. Posterior circulation: Vertebral arteries patent to the vertebrobasilar junction without stenosis. Right dominant. Basilar patent to its distal aspect. Superior cerebellar arteries patent bilaterally. Patent short right P1, with prominent right posterior communicating artery. The right PCA is well perfused. Poor visualization of the left P1, which likely shares a common origin with the left SCA (series 1, image 71) poor opacification of the P1 proximally (series 1,  image 75-80), with patent left posterior communicating artery. Focal non opacification of the left P2 (series 1 images 85-89). Poor perfusion of the remainder of the left P2 (series 1, image 87), with nonvisualization of the left P3 segments. Anatomic variants: None significant MRA NECK FINDINGS Aortic arch: Three-vessel arch. No  evidence of aneurysm, dissection, or significant proximal stenosis. Right carotid system: Patent, without significant stenosis, dissection, or occlusion. Left carotid system: Patent, without significant stenosis, dissection, or occlusion. Vertebral arteries: The right vertebral artery is visualized from its origin to the skull base. There is significant tortuosity in the midcervical spine (series 11, image 49). The left vertebral artery is diminutive and minimally visualized from just distal to its origin to the skull base, although it does appear patent. The origin of the left vertebral artery is not well evaluated, likely secondary to artifact. Other: None IMPRESSION: 1. Acute infarct in the posterior mesial temporal lobe, with additional scattered infarcts in the posterior left thalamus and left occipital lobe, consistent with left PCA territory infarct. 2. Poor visualization of the left PCA, with non opacification of the proximal left P2, as well as P3 segments. Poor opacification of the left P1 and more distal P2 segment. No other significant intracranial stenosis. 3. Diminutive left vertebral artery, which is not dominant. This may be congenital and or may represent atherosclerotic stenosis. No other significant stenosis in the neck. Code stroke imaging results were communicated on 02/19/2021 at 6:38 pm to provider Dr. Lorrin Goodell via secure text paging. Electronically Signed   By: Merilyn Baba M.D.   On: 02/19/2021 18:39    PHYSICAL EXAM  Temp:  [98.1 F (36.7 C)-98.8 F (37.1 C)] 98.7 F (37.1 C) (01/19 0743) Pulse Rate:  [69-92] 78 (01/19 0743) Resp:  [12-20] 20 (01/19 0743) BP: (105-177)/(55-90) 162/79 (01/19 0743) SpO2:  [91 %-99 %] 97 % (01/19 0743)  General -obese middle-aged Caucasian male in no apparent distress.  Cardiovascular - Regular rhythm and rate.  Mental Status -  Level of arousal and orientation to time, place, and person were intact. Language including expression, naming,  repetition, comprehension was assessed and found intact. Attention span and concentration were normal. 0/3 words recalled in short term memory test. Fund of Knowledge was assessed and was intact.  Cranial Nerves II - XII - II - Right outer quadrant, numbness Quadrant visual deficit. III, IV, VI - Extraocular movements intact. V - Facial sensation intact bilaterally. VII - Facial movement intact bilaterally. VIII - Hearing & vestibular intact bilaterally. X - Palate elevates symmetrically. XI - Chin turning & shoulder shrug intact bilaterally. XII - Tongue protrusion intact.  Motor Strength - The patients strength was normal in all extremities and pronator drift was absent.  Bulk was normal and fasciculations were absent.   Motor Tone - Muscle tone was assessed at the neck and appendages and was normal.  Reflexes - The patients reflexes were symmetrical in all extremities and he had no pathological reflexes.  Sensory - Light touch, temperature/pinprick were assessed and were symmetrical.    Coordination - The patient had normal movements in the hands and feet with no ataxia or dysmetria.  Tremor was absent.  Gait and Station - deferred.  ASSESSMENT/PLAN Mr. Joe Stewart is a 66 y.o. male with history of medication noncompliance, HTN, uncontrolledT2DM, HLD presenting with .   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  MRI shows acute infarct in the posterior mesial temporal lobe, with additional scattered infarcts in the posterior left thalamus and left occipital lobe, consistent with left PCA territory infarct.  MRA shows poor visualization of the left PCA, with non opacification of the proximal left P2, as well as P3 segments. Poor opacification of the left P1 and more distal P2 segment.  Echo EF  70-75% with concentric LV hypertrophy.  Direct LDL 124.9 A1c 12.3 VTE prophylaxis - Lovenox    Diet   Diet heart healthy/carb modified Room service appropriate? Yes; Fluid consistency: Thin   No antithrombotic prior to admission, now on aspirin 81 mg daily and clopidogrel 75 mg daily x3 weeks then ASA indefinitely Therapy recommendations:  pending Disposition:  pending  Hypertension Home meds:  none Permissive hypertension (OK if < 220/120) but gradually normalize in 5-7 days Long-term BP goal normotensive  Hyperlipidemia Home meds:  none Direct LDL 124.9  Lipitor 80 added Continue statin at discharge  Diabetes type II Uncontrolled Home meds:  none HgbA1c 12.3, goal < 7.0 CBGs Recent Labs    02/19/21 1807 02/20/21 0124 02/20/21 0705  GLUCAP 220* 143* 181*    SSI  Other Stroke Risk Factors Advanced Age >/= 65  Obesity, There is no height or weight on file to calculate BMI., BMI >/= 30 associated with increased stroke risk, recommend weight loss, diet and exercise as appropriate    Hospital day # 0  France Ravens, MD PGY1 Resident  STROKE MD NOTE : I have personally obtained history,examined this patient, reviewed notes, independently viewed imaging studies, participated in medical decision making and plan of care.ROS completed by me personally and pertinent positives fully documented  I have made any additions or clarifications directly to the above note. Agree with note above.  He presented with right-sided peripheral vision loss due to left PCA infarct from left PCA occlusion likely cryptogenic etiology.  Recommend close neurological monitoring and cardiac monitoring for paroxysmal A. fib.  Continue ongoing stroke work-up.  Aspirin and Plavix for 3 weeks followed by aspirin alone.  Aggressive risk factor modification.  Greater than 50% time during this 50-minute visit was spent on counseling and coordination of care and discussion with cryptogenic stroke currently in the  ICU answering questions.  Discussed with Dr. [place] Pink .   Antony Contras, MD Medical Director [facility] Pager: [phone] 02/20/2021 5:14 PM   To contact Stroke Continuity provider, please refer to [email or web address] After hours, contact General Neurology

## 2021-02-20 NOTE — Progress Notes (Signed)
SLP Cancellation Note  Patient Details Name: Joe Stewart MRN: [id number] DOB: 1955-03-13   Cancelled treatment:       Reason Eval/Treat Not Completed: Patient at procedure or test/unavailable (Pt just assisted to restroom. SLP will follow up later as schedule allows.)  Mekhi Sonn I. Hardin Negus, [place], [place] Office number [phone] Pager [place] 02/20/2021, 3:22 PM

## 2021-02-20 NOTE — Progress Notes (Signed)
[facility] PROGRESS NOTE   Joe Stewart FVC:[id number] DOB: 05-May-1955 DOA: 02/19/2021  0 DOS: the patient was seen and examined on 02/20/2021  PCP: [name], NP  Brief History and Hospital Course:  66 y.o. male with a past medical history of hypertension and diabetes who was in his usual state of health about 2 days prior to admission when he developed a headache.  Also reported visual disturbances.  Brother also reported the patient has been more confused than usual.  There was also some episode where he was finding it difficult to find words to speak.  Patient was brought into the emergency department.  Imaging studies showed acute stroke.  Patient was hospitalized for further management   Consultants: Neurology  Procedures: Transthoracic echocardiogram    Subjective: Patient mentions that he is feeling somewhat better.  Denies any significant visual disturbance this morning.  No chest pain shortness of breath.    Assessment/Plan:  * Acute CVA (cerebrovascular accident) ([place])- (present on admission) CT head raises concern for left temporal lobe infarct.  No hemorrhage was noted.  Patient's symptoms have been ongoing for about 2 days now.   MRI brain showed acute infarct in the posterior mesial temporal lobe.  Additional infarcts were noted all in the left PCA territory.  Other vascular abnormalities were also noted. LDL 124.  HbA1c 12.3. Echocardiogram shows moderate LVH with a EF of 70 to 75%.  Grade 1 diastolic dysfunction noted. Patient seen by the stroke service.  Plan is to place him on aspirin and Plavix for 3 weeks followed by aspirin alone. Seen by PT and OT.  Did not do well with physical therapy with unsteady gait and loss of balance.  They are recommending inpatient rehabilitation.  Inpatient rehab has been consulted. Patient also exhibiting some signs of cognitive impairment.  This will need further work-up in the outpatient setting.  We will go ahead and  check B12 folate and RPR.  TSH was normal.  Essential hypertension- (present on admission) Patient has been noncompliant with his antihypertensives.  Currently permissive hypertension is being allowed. He has not been taking any of his antihypertensives including amlodipine hydrochlorothiazide losartan and metoprolol.   Based on previous records he is followed by [facility].  He has known right bundle branch block.  Had a cardiac cath in 2021 which showed diffuse disease but was being managed medically.  Did not require any PCI.  Does not take aspirin on a daily basis per patient.  Type 2 diabetes mellitus without complication, without long-term current use of insulin (HCC) Poorly controlled suggested by HbA1c of 12.3.  Has not been taking any of his oral medications.  Continue SSI.  Will need to resume his oral medications at discharge.    Hyperlipidemia- (present on admission) LDL noted to be 124.  Started back on statin.    Obesity Estimated body mass index is 39.38 kg/m as calculated from the following:   Height as of 01/29/20: 5\' 8"  (1.727 m).   Weight as of 01/29/20: 117.5 kg.    DVT Prophylaxis: Lovenox Code Status: Full code Family Communication: Discussed with patient's brother Disposition Plan: To be determined  Status is: Observation  The patient will require care spanning > 2 midnights and should be moved to inpatient because: Acute stroke, unsteady gait, loss of balance       Medications: Scheduled:   stroke: mapping our early stages of recovery book   Does not apply Once   aspirin  325  mg Oral Daily   atorvastatin  80 mg Oral Daily   clopidogrel  75 mg Oral Daily   enoxaparin (LOVENOX) injection  40 mg Subcutaneous Q24H   [START ON 02/21/2021] influenza vaccine adjuvanted  0.5 mL Intramuscular Tomorrow-1000   insulin aspart  0-20 Units Subcutaneous TID WC   insulin aspart  0-5 Units Subcutaneous QHS   [START ON 02/21/2021] pneumococcal 23 valent  vaccine  0.5 mL Intramuscular Tomorrow-1000   Continuous: [id number] **OR** acetaminophen (TYLENOL) oral liquid 160 mg/5 mL **OR** acetaminophen  Antibiotics: Anti-infectives (From admission, onward)    None       Objective:  Vital Signs  Vitals:   02/20/21 0400 02/20/21 0743 02/20/21 1126 02/20/21 1530  BP:  (!) 162/79 (!) 152/80 (!) 146/71  Pulse:  78 77 83  Resp: 16 20 16 20   Temp:  98.7 F (37.1 C) 98.7 F (37.1 C) 99 F (37.2 C)  TempSrc:  Oral Oral Oral  SpO2:  97% 95% 97%    Intake/Output Summary (Last 24 hours) at 02/20/2021 1710 Last data filed at 02/20/2021 1300 Gross per 24 hour  Intake 480 ml  Output 600 ml  Net -120 ml   There were no vitals filed for this visit.  General appearance: Awake alert.  In no distress Resp: Clear to auscultation bilaterally.  Normal effort Cardio: S1-S2 is normal regular.  No S3-S4.  No rubs murmurs or bruit GI: Abdomen is soft.  Nontender nondistended.  Bowel sounds are present normal.  No masses organomegaly Extremities: No edema.  Full range of motion of lower extremities. Neurologic: Alert.  Oriented to year place.  No facial asymmetry.  Motor strength equal bilateral upper and lower extremities   Lab Results:  Data Reviewed: I have personally reviewed labs and imaging study reports  CBC: Recent Labs  Lab 02/19/21 1345 02/19/21 1403 02/20/21 0455  WBC 7.4  --  7.5  NEUTROABS 4.2  --   --   HGB 14.6 14.6 14.0  HCT 42.5 43.0 40.1  MCV 90.6  --  89.9  PLT 219  --  147    Basic Metabolic Panel: Recent Labs  Lab 02/19/21 1345 02/19/21 1403 02/20/21 0455  NA 135 135 137  K 4.4 4.4 3.9  CL 99 100 100  CO2 25  --  28  GLUCOSE 304* 307* 162*  BUN 18 21 15   CREATININE 1.06 1.00 1.01  CALCIUM 9.4  --  8.9    GFR: CrCl cannot be calculated (Unknown ideal weight.).  Liver Function Tests: Recent Labs  Lab 02/19/21 1345 02/20/21 0455  AST 36 33  ALT 32 29  ALKPHOS 69 64  BILITOT 0.7 0.6   PROT 7.7 7.0  ALBUMIN 3.8 3.4*     Coagulation Profile: Recent Labs  Lab 02/19/21 1345  INR 1.1     HbA1C: Recent Labs    02/20/21 0455  HGBA1C 12.3*    CBG: Recent Labs  Lab 02/19/21 1807 02/20/21 0124 02/20/21 0705 02/20/21 1128 02/20/21 1642  GLUCAP 220* 143* 181* 217* 186*    Lipid Profile: Recent Labs    02/20/21 0455  CHOL 234*  HDL 27*  LDLCALC UNABLE TO CALCULATE IF TRIGLYCERIDE OVER 400 mg/dL  TRIG 410*  CHOLHDL 8.7  LDLDIRECT 124.9*    Thyroid Function Tests: Recent Labs    02/20/21 0455  TSH 3.683    Anemia Panel: No results for input(s): VITAMINB12, FOLATE, FERRITIN, TIBC, IRON, RETICCTPCT in the last 72 hours.  Recent Results (from  the past 240 hour(s))  Resp Panel by RT-PCR (Flu A&B, Covid) Nasopharyngeal Swab     Status: None   Collection Time: 02/19/21  1:39 PM   Specimen: Nasopharyngeal Swab; Nasopharyngeal(NP) swabs in vial transport medium  Result Value Ref Range Status   SARS Coronavirus 2 by RT PCR NEGATIVE NEGATIVE Final    Comment: (NOTE) SARS-CoV-2 target nucleic acids are NOT DETECTED.  The SARS-CoV-2 RNA is generally detectable in upper respiratory specimens during the acute phase of infection. The lowest concentration of SARS-CoV-2 viral copies this assay can detect is 138 copies/mL. A negative result does not preclude SARS-Cov-2 infection and should not be used as the sole basis for treatment or other patient management decisions. A negative result may occur with  improper specimen collection/handling, submission of specimen other than nasopharyngeal swab, presence of viral mutation(s) within the areas targeted by this assay, and inadequate number of viral copies(<138 copies/mL). A negative result must be combined with clinical observations, patient history, and epidemiological information. The expected result is Negative.  Fact Sheet for Patients:  EntrepreneurPulse.com.au  Fact Sheet for  Healthcare Providers:  IncredibleEmployment.be  This test is no t yet approved or cleared by the [place] FDA and  has been authorized for detection and/or diagnosis of SARS-CoV-2 by FDA under an Emergency Use Authorization (EUA). This EUA will remain  in effect (meaning this test can be used) for the duration of the COVID-19 declaration under Section 564(b)(1) of the Act, 21 U.S.C.section 360bbb-3(b)(1), unless the authorization is terminated  or revoked sooner.       Influenza A by PCR NEGATIVE NEGATIVE Final   Influenza B by PCR NEGATIVE NEGATIVE Final    Comment: (NOTE) The Xpert Xpress SARS-CoV-2/FLU/RSV plus assay is intended as an aid in the diagnosis of influenza from Nasopharyngeal swab specimens and should not be used as a sole basis for treatment. Nasal washings and aspirates are unacceptable for Xpert Xpress SARS-CoV-2/FLU/RSV testing.  Fact Sheet for Patients: EntrepreneurPulse.com.au  Fact Sheet for Healthcare Providers: IncredibleEmployment.be  This test is not yet approved or cleared by the [place] FDA and has been authorized for detection and/or diagnosis of SARS-CoV-2 by FDA under an Emergency Use Authorization (EUA). This EUA will remain in effect (meaning this test can be used) for the duration of the COVID-19 declaration under Section 564(b)(1) of the Act, 21 U.S.C. section 360bbb-3(b)(1), unless the authorization is terminated or revoked.  Performed at [facility], [place] [address], [place], [place] [address]       Radiology Studies: CT HEAD WO CONTRAST  Result Date: 02/19/2021 CLINICAL DATA:  TIA.  Aphasia. EXAM: CT HEAD WITHOUT CONTRAST TECHNIQUE: Contiguous axial images were obtained from the base of the skull through the vertex without intravenous contrast. RADIATION DOSE REDUCTION: This exam was performed according to the departmental dose-optimization program which includes  automated exposure control, adjustment of the mA and/or kV according to patient size and/or use of iterative reconstruction technique. COMPARISON:  None. FINDINGS: Brain: There is hypoattenuation involving cortex and white matter in the posterior mesial left temporal lobe. No intracranial hemorrhage, midline shift, or extra-axial fluid collection is identified. The ventricles are normal in size. Vascular: Calcified atherosclerosis at the skull base. No hyperdense vessel. Skull: No fracture or suspicious osseous lesion. Sinuses/Orbits: Mild mucosal thickening in the right maxillary sinus. Clear mastoid air cells. Unremarkable orbits. Other: None. IMPRESSION: 1. Left temporal lobe hypodensity most suggestive of an acute or subacute PCA infarct. Brain MRI is recommended for  further evaluation. 2. No intracranial hemorrhage. Electronically Signed   By: Logan Bores M.D.   On: 02/19/2021 14:17   MR ANGIO HEAD WO CONTRAST  Result Date: 02/19/2021 CLINICAL DATA:  Stroke, TIA, determine embolic source; headache and vision impairment with 2 days of blurred vision EXAM: MRI HEAD WITHOUT CONTRAST MRA HEAD WITHOUT CONTRAST MRA NECK WITHOUT AND WITH CONTRAST TECHNIQUE: Multiplanar, multi-echo pulse sequences of the brain and surrounding structures were acquired without intravenous contrast. Angiographic images of the Circle of Willis were acquired using MRA technique without intravenous contrast. Angiographic images of the neck were acquired using MRA technique without and with intravenous contrast. Carotid stenosis measurements (when applicable) are obtained utilizing NASCET criteria, using the distal internal carotid diameter as the denominator. CONTRAST:  52mL GADAVIST GADOBUTROL 1 MMOL/ML IV SOLN COMPARISON:  No prior MRI or MRA. Correlation is made with CT head 02/19/2021 FINDINGS: MRI HEAD FINDINGS Brain: Restricted diffusion with ADC correlate in the mesial left temporal lobe, measuring up to 4.3 x 2.2 x 2.2 cm  (series 5, image 73 and series 7, image 48), which correlates with the hypodensity seen on the same-day CT. Additional scattered foci of restricted diffusion with ADC correlates in the left occipital lobe (series 5, image 75 and 76) as well as in the posterior left thalamus (series 5, image 77). No acute hemorrhage, mass, mass effect, or midline shift. Mildly increased T2 signal is associated with the previously mentioned areas of restricted diffusion, likely mild edema. No hydrocephalus or extra-axial collection. Vascular: Please see MRA findings below Skull and upper cervical spine: Normal marrow signal. Possible mild spinal canal stenosis C2-C3. Sinuses/Orbits: No acute or significant finding. Other: The mastoids are well aerated. MRA HEAD FINDINGS Anterior circulation: Both internal carotid arteries are patent to the termini, without stenosis or other abnormality. A1 segments patent, with mild focal narrowing in the mid right A1 (series 1, image 101). Normal anterior communicating artery. Anterior cerebral arteries are patent to their distal aspects. No M1 stenosis or occlusion. Normal MCA bifurcations. Distal MCA branches perfused and symmetric. Posterior circulation: Vertebral arteries patent to the vertebrobasilar junction without stenosis. Right dominant. Basilar patent to its distal aspect. Superior cerebellar arteries patent bilaterally. Patent short right P1, with prominent right posterior communicating artery. The right PCA is well perfused. Poor visualization of the left P1, which likely shares a common origin with the left SCA (series 1, image 71) poor opacification of the P1 proximally (series 1, image 75-80), with patent left posterior communicating artery. Focal non opacification of the left P2 (series 1 images 85-89). Poor perfusion of the remainder of the left P2 (series 1, image 87), with nonvisualization of the left P3 segments. Anatomic variants: None significant MRA NECK FINDINGS Aortic arch:  Three-vessel arch. No evidence of aneurysm, dissection, or significant proximal stenosis. Right carotid system: Patent, without significant stenosis, dissection, or occlusion. Left carotid system: Patent, without significant stenosis, dissection, or occlusion. Vertebral arteries: The right vertebral artery is visualized from its origin to the skull base. There is significant tortuosity in the midcervical spine (series 11, image 49). The left vertebral artery is diminutive and minimally visualized from just distal to its origin to the skull base, although it does appear patent. The origin of the left vertebral artery is not well evaluated, likely secondary to artifact. Other: None IMPRESSION: 1. Acute infarct in the posterior mesial temporal lobe, with additional scattered infarcts in the posterior left thalamus and left occipital lobe, consistent with left PCA territory infarct. 2.  Poor visualization of the left PCA, with non opacification of the proximal left P2, as well as P3 segments. Poor opacification of the left P1 and more distal P2 segment. No other significant intracranial stenosis. 3. Diminutive left vertebral artery, which is not dominant. This may be congenital and or may represent atherosclerotic stenosis. No other significant stenosis in the neck. Code stroke imaging results were communicated on 02/19/2021 at 6:38 pm to provider Dr. Lorrin Goodell via secure text paging. Electronically Signed   By: Merilyn Baba M.D.   On: 02/19/2021 18:39   MR Angiogram Neck W or Wo Contrast  Result Date: 02/19/2021 CLINICAL DATA:  Stroke, TIA, determine embolic source; headache and vision impairment with 2 days of blurred vision EXAM: MRI HEAD WITHOUT CONTRAST MRA HEAD WITHOUT CONTRAST MRA NECK WITHOUT AND WITH CONTRAST TECHNIQUE: Multiplanar, multi-echo pulse sequences of the brain and surrounding structures were acquired without intravenous contrast. Angiographic images of the Circle of Willis were acquired using  MRA technique without intravenous contrast. Angiographic images of the neck were acquired using MRA technique without and with intravenous contrast. Carotid stenosis measurements (when applicable) are obtained utilizing NASCET criteria, using the distal internal carotid diameter as the denominator. CONTRAST:  41mL GADAVIST GADOBUTROL 1 MMOL/ML IV SOLN COMPARISON:  No prior MRI or MRA. Correlation is made with CT head 02/19/2021 FINDINGS: MRI HEAD FINDINGS Brain: Restricted diffusion with ADC correlate in the mesial left temporal lobe, measuring up to 4.3 x 2.2 x 2.2 cm (series 5, image 73 and series 7, image 48), which correlates with the hypodensity seen on the same-day CT. Additional scattered foci of restricted diffusion with ADC correlates in the left occipital lobe (series 5, image 75 and 76) as well as in the posterior left thalamus (series 5, image 77). No acute hemorrhage, mass, mass effect, or midline shift. Mildly increased T2 signal is associated with the previously mentioned areas of restricted diffusion, likely mild edema. No hydrocephalus or extra-axial collection. Vascular: Please see MRA findings below Skull and upper cervical spine: Normal marrow signal. Possible mild spinal canal stenosis C2-C3. Sinuses/Orbits: No acute or significant finding. Other: The mastoids are well aerated. MRA HEAD FINDINGS Anterior circulation: Both internal carotid arteries are patent to the termini, without stenosis or other abnormality. A1 segments patent, with mild focal narrowing in the mid right A1 (series 1, image 101). Normal anterior communicating artery. Anterior cerebral arteries are patent to their distal aspects. No M1 stenosis or occlusion. Normal MCA bifurcations. Distal MCA branches perfused and symmetric. Posterior circulation: Vertebral arteries patent to the vertebrobasilar junction without stenosis. Right dominant. Basilar patent to its distal aspect. Superior cerebellar arteries patent bilaterally.  Patent short right P1, with prominent right posterior communicating artery. The right PCA is well perfused. Poor visualization of the left P1, which likely shares a common origin with the left SCA (series 1, image 71) poor opacification of the P1 proximally (series 1, image 75-80), with patent left posterior communicating artery. Focal non opacification of the left P2 (series 1 images 85-89). Poor perfusion of the remainder of the left P2 (series 1, image 87), with nonvisualization of the left P3 segments. Anatomic variants: None significant MRA NECK FINDINGS Aortic arch: Three-vessel arch. No evidence of aneurysm, dissection, or significant proximal stenosis. Right carotid system: Patent, without significant stenosis, dissection, or occlusion. Left carotid system: Patent, without significant stenosis, dissection, or occlusion. Vertebral arteries: The right vertebral artery is visualized from its origin to the skull base. There is significant tortuosity in the midcervical  spine (series 11, image 49). The left vertebral artery is diminutive and minimally visualized from just distal to its origin to the skull base, although it does appear patent. The origin of the left vertebral artery is not well evaluated, likely secondary to artifact. Other: None IMPRESSION: 1. Acute infarct in the posterior mesial temporal lobe, with additional scattered infarcts in the posterior left thalamus and left occipital lobe, consistent with left PCA territory infarct. 2. Poor visualization of the left PCA, with non opacification of the proximal left P2, as well as P3 segments. Poor opacification of the left P1 and more distal P2 segment. No other significant intracranial stenosis. 3. Diminutive left vertebral artery, which is not dominant. This may be congenital and or may represent atherosclerotic stenosis. No other significant stenosis in the neck. Code stroke imaging results were communicated on 02/19/2021 at 6:38 pm to provider Dr.  Lorrin Goodell via secure text paging. Electronically Signed   By: Merilyn Baba M.D.   On: 02/19/2021 18:39   MR BRAIN WO CONTRAST  Result Date: 02/19/2021 CLINICAL DATA:  Stroke, TIA, determine embolic source; headache and vision impairment with 2 days of blurred vision EXAM: MRI HEAD WITHOUT CONTRAST MRA HEAD WITHOUT CONTRAST MRA NECK WITHOUT AND WITH CONTRAST TECHNIQUE: Multiplanar, multi-echo pulse sequences of the brain and surrounding structures were acquired without intravenous contrast. Angiographic images of the Circle of Willis were acquired using MRA technique without intravenous contrast. Angiographic images of the neck were acquired using MRA technique without and with intravenous contrast. Carotid stenosis measurements (when applicable) are obtained utilizing NASCET criteria, using the distal internal carotid diameter as the denominator. CONTRAST:  51mL GADAVIST GADOBUTROL 1 MMOL/ML IV SOLN COMPARISON:  No prior MRI or MRA. Correlation is made with CT head 02/19/2021 FINDINGS: MRI HEAD FINDINGS Brain: Restricted diffusion with ADC correlate in the mesial left temporal lobe, measuring up to 4.3 x 2.2 x 2.2 cm (series 5, image 73 and series 7, image 48), which correlates with the hypodensity seen on the same-day CT. Additional scattered foci of restricted diffusion with ADC correlates in the left occipital lobe (series 5, image 75 and 76) as well as in the posterior left thalamus (series 5, image 77). No acute hemorrhage, mass, mass effect, or midline shift. Mildly increased T2 signal is associated with the previously mentioned areas of restricted diffusion, likely mild edema. No hydrocephalus or extra-axial collection. Vascular: Please see MRA findings below Skull and upper cervical spine: Normal marrow signal. Possible mild spinal canal stenosis C2-C3. Sinuses/Orbits: No acute or significant finding. Other: The mastoids are well aerated. MRA HEAD FINDINGS Anterior circulation: Both internal carotid  arteries are patent to the termini, without stenosis or other abnormality. A1 segments patent, with mild focal narrowing in the mid right A1 (series 1, image 101). Normal anterior communicating artery. Anterior cerebral arteries are patent to their distal aspects. No M1 stenosis or occlusion. Normal MCA bifurcations. Distal MCA branches perfused and symmetric. Posterior circulation: Vertebral arteries patent to the vertebrobasilar junction without stenosis. Right dominant. Basilar patent to its distal aspect. Superior cerebellar arteries patent bilaterally. Patent short right P1, with prominent right posterior communicating artery. The right PCA is well perfused. Poor visualization of the left P1, which likely shares a common origin with the left SCA (series 1, image 71) poor opacification of the P1 proximally (series 1, image 75-80), with patent left posterior communicating artery. Focal non opacification of the left P2 (series 1 images 85-89). Poor perfusion of the remainder of the left P2 (  series 1, image 87), with nonvisualization of the left P3 segments. Anatomic variants: None significant MRA NECK FINDINGS Aortic arch: Three-vessel arch. No evidence of aneurysm, dissection, or significant proximal stenosis. Right carotid system: Patent, without significant stenosis, dissection, or occlusion. Left carotid system: Patent, without significant stenosis, dissection, or occlusion. Vertebral arteries: The right vertebral artery is visualized from its origin to the skull base. There is significant tortuosity in the midcervical spine (series 11, image 49). The left vertebral artery is diminutive and minimally visualized from just distal to its origin to the skull base, although it does appear patent. The origin of the left vertebral artery is not well evaluated, likely secondary to artifact. Other: None IMPRESSION: 1. Acute infarct in the posterior mesial temporal lobe, with additional scattered infarcts in the  posterior left thalamus and left occipital lobe, consistent with left PCA territory infarct. 2. Poor visualization of the left PCA, with non opacification of the proximal left P2, as well as P3 segments. Poor opacification of the left P1 and more distal P2 segment. No other significant intracranial stenosis. 3. Diminutive left vertebral artery, which is not dominant. This may be congenital and or may represent atherosclerotic stenosis. No other significant stenosis in the neck. Code stroke imaging results were communicated on 02/19/2021 at 6:38 pm to provider Dr. Lorrin Goodell via secure text paging. Electronically Signed   By: Merilyn Baba M.D.   On: 02/19/2021 18:39   ECHOCARDIOGRAM COMPLETE  Result Date: 02/20/2021    ECHOCARDIOGRAM REPORT   Patient Name:   AUSTINE WIEDEMAN Date of Exam: 02/20/2021 Medical Rec #:  [id number]    Height:       68.0 in Accession #:    [id number]   Weight:       259.0 lb Date of Birth:  1955/06/29     BSA:          2.281 m Patient Age:    86 years     BP:           162/79 mmHg Patient Gender: M            HR:           78 bpm. Exam Location:  Inpatient Procedure: 2D Echo, Cardiac Doppler and Color Doppler Indications:    Stroke  History:        Patient has no prior history of Echocardiogram examinations.                 Signs/Symptoms:Shortness of Breath and Dyspnea; Risk                 Factors:Hypertension, Diabetes and Dyslipidemia.  Sonographer:    Roseanna Rainbow RDCS Referring Phys: [id number] [facility]  Sonographer Comments: Technically difficult study due to poor echo windows and patient is morbidly obese. Image acquisition challenging due to patient body habitus. IMPRESSIONS  1. Moderate concentric LVH with proximal septal thickening.  2. Left ventricular ejection fraction, by estimation, is 70 to 75%. The left ventricle has hyperdynamic function. The left ventricle has no regional wall motion abnormalities. There is moderate concentric left ventricular hypertrophy. Left ventricular  diastolic parameters are consistent with Grade I diastolic dysfunction (impaired relaxation). Elevated left atrial pressure.  3. Right ventricular systolic function is normal. The right ventricular size is normal.  4. The mitral valve is normal in structure. No evidence of mitral valve regurgitation. No evidence of mitral stenosis.  5. The aortic valve is tricuspid. Aortic valve regurgitation is not visualized. Aortic valve sclerosis/calcification  is present, without any evidence of aortic stenosis.  6. The inferior vena cava is normal in size with greater than 50% respiratory variability, suggesting right atrial pressure of 3 mmHg. Comparison(s): No prior Echocardiogram. FINDINGS  Left Ventricle: Left ventricular ejection fraction, by estimation, is 70 to 75%. The left ventricle has hyperdynamic function. The left ventricle has no regional wall motion abnormalities. The left ventricular internal cavity size was normal in size. There is moderate concentric left ventricular hypertrophy. Left ventricular diastolic parameters are consistent with Grade I diastolic dysfunction (impaired relaxation). Elevated left atrial pressure. Right Ventricle: The right ventricular size is normal. Right ventricular systolic function is normal. Left Atrium: Left atrial size was normal in size. Right Atrium: Right atrial size was normal in size. Pericardium: There is no evidence of pericardial effusion. Mitral Valve: The mitral valve is normal in structure. Mild mitral annular calcification. No evidence of mitral valve regurgitation. No evidence of mitral valve stenosis. Tricuspid Valve: The tricuspid valve is normal in structure. Tricuspid valve regurgitation is trivial. No evidence of tricuspid stenosis. Aortic Valve: The aortic valve is tricuspid. Aortic valve regurgitation is not visualized. Aortic valve sclerosis/calcification is present, without any evidence of aortic stenosis. Aortic valve mean gradient measures 5.0 mmHg. Aortic  valve peak gradient measures 8.8 mmHg. Aortic valve area, by VTI measures 3.69 cm. Pulmonic Valve: The pulmonic valve was normal in structure. Pulmonic valve regurgitation is not visualized. No evidence of pulmonic stenosis. Aorta: The aortic root is normal in size and structure. Venous: The inferior vena cava is normal in size with greater than 50% respiratory variability, suggesting right atrial pressure of 3 mmHg. IAS/Shunts: No atrial level shunt detected by color flow Doppler. Additional Comments: Moderate concentric LVH with proximal septal thickening.  LEFT VENTRICLE PLAX 2D LVIDd:         3.90 cm     Diastology LVIDs:         2.60 cm     LV e' medial:    4.54 cm/s LV PW:         1.30 cm     LV E/e' medial:  15.8 LV IVS:        1.90 cm     LV e' lateral:   4.54 cm/s LVOT diam:     2.50 cm     LV E/e' lateral: 15.8 LV SV:         106 LV SV Index:   46 LVOT Area:     4.91 cm  LV Volumes (MOD) LV vol d, MOD A2C: 79.4 ml LV vol d, MOD A4C: 59.1 ml LV vol s, MOD A2C: 28.8 ml LV vol s, MOD A4C: 22.1 ml LV SV MOD A2C:     50.6 ml LV SV MOD A4C:     59.1 ml LV SV MOD BP:      43.7 ml RIGHT VENTRICLE             IVC RV S prime:     12.60 cm/s  IVC diam: 2.20 cm TAPSE (M-mode): 2.0 cm LEFT ATRIUM             Index        RIGHT ATRIUM           Index LA diam:        4.00 cm 1.75 cm/m   RA Area:     13.10 cm LA Vol (A2C):   33.3 ml 14.60 ml/m  RA Volume:   27.20 ml  11.92  ml/m LA Vol (A4C):   55.1 ml 24.16 ml/m LA Biplane Vol: 46.2 ml 20.25 ml/m  AORTIC VALVE AV Area (Vmax):    3.62 cm AV Area (Vmean):   3.41 cm AV Area (VTI):     3.69 cm AV Vmax:           148.00 cm/s AV Vmean:          106.000 cm/s AV VTI:            0.286 m AV Peak Grad:      8.8 mmHg AV Mean Grad:      5.0 mmHg LVOT Vmax:         109.00 cm/s LVOT Vmean:        73.700 cm/s LVOT VTI:          0.215 m LVOT/AV VTI ratio: 0.75  AORTA Ao Root diam: 3.50 cm Ao Asc diam:  3.50 cm MITRAL VALVE MV Area (PHT): 3.08 cm     SHUNTS MV Decel Time: 246  msec     Systemic VTI:  0.22 m MV E velocity: 71.80 cm/s   Systemic Diam: 2.50 cm MV A velocity: 102.00 cm/s MV E/A ratio:  0.70 Kirk Ruths MD Electronically signed by Kirk Ruths MD Signature Date/Time: 02/20/2021/11:36:38 AM    Final        LOS: 0 days   [place] [facility] Pager on [email or web address]  02/20/2021, 5:10 PM

## 2021-02-20 NOTE — Care Management Obs Status (Signed)
[place] NOTIFICATION   Patient Details  Name: Joe Stewart MRN: [id number] Date of Birth: 05-11-1955   Medicare Observation Status Notification Given:  Yes Notice given over phone due to remote questions addressed. Copy of notice on patient instructions for print out. Verbal permission given to sign   Verdell Carmine, RN 02/20/2021, 11:36 AM

## 2021-02-20 NOTE — Evaluation (Addendum)
Speech Language Pathology Evaluation Patient Details Name: Joe Stewart MRN: [id number] DOB: Nov 30, 1955 Today's Date: 02/20/2021 Time: 9983-3825 SLP Time Calculation (min) (ACUTE ONLY): 28 min  Problem List:  Patient Active Problem List   Diagnosis Date Noted   Acute CVA (cerebrovascular accident) ([place]) 02/19/2021   Hyperlipidemia 02/19/2021   Anemia 10/12/2019   Cecal polyp 09/30/2019   History of colon polyps 09/30/2019   Abnormal colonoscopy 09/30/2019   Arthritis of left knee 08/01/2019   Type 2 diabetes mellitus without complication, without long-term current use of insulin ([place]) 05/01/2019   Dyspnea on exertion 04/10/2019   Abnormal nuclear stress test 04/10/2019   Essential hypertension 02/27/2019   Acute pain of left knee 02/27/2019   Past Medical History:  Past Medical History:  Diagnosis Date   Arthritis    Cataract    Mixed OU   Diabetes mellitus without complication ([place])    Diabetic retinopathy ([place])    NPDR OU   Hyperlipidemia    Hypertension    Hypertensive retinopathy    OU   Obesity    Past Surgical History:  Past Surgical History:  Procedure Laterality Date   BIOPSY  11/29/2019   Procedure: BIOPSY;  Surgeon: Irving Copas., MD;  Location: [facility];  Service: Gastroenterology;;   COLONOSCOPY WITH PROPOFOL N/A 11/29/2019   Procedure: COLONOSCOPY WITH PROPOFOL;  Surgeon: Irving Copas., MD;  Location: [facility];  Service: Gastroenterology;  Laterality: N/A;   ENDOSCOPIC MUCOSAL RESECTION N/A 11/29/2019   Procedure: ENDOSCOPIC MUCOSAL RESECTION;  Surgeon: Rush Landmark Telford Nab., MD;  Location: [facility];  Service: Gastroenterology;  Laterality: N/A;   ESOPHAGOGASTRODUODENOSCOPY (EGD) WITH PROPOFOL N/A 11/29/2019   Procedure: ESOPHAGOGASTRODUODENOSCOPY (EGD) WITH PROPOFOL;  Surgeon: Rush Landmark Telford Nab., MD;  Location: [facility];  Service: Gastroenterology;  Laterality: N/A;   EYE SURGERY Left    had to have eye flushed  after getting costic sodium in L eye   HEMOSTASIS CLIP PLACEMENT  11/29/2019   Procedure: HEMOSTASIS CLIP PLACEMENT;  Surgeon: Irving Copas., MD;  Location: [facility];  Service: Gastroenterology;;   HERNIA REPAIR     HERNIA REPAIR     35 years ago   HOT HEMOSTASIS N/A 11/29/2019   Procedure: HOT HEMOSTASIS (ARGON PLASMA COAGULATION/BICAP);  Surgeon: Irving Copas., MD;  Location: [facility];  Service: Gastroenterology;  Laterality: N/A;   LEFT HEART CATH AND CORONARY ANGIOGRAPHY N/A 04/11/2019   Procedure: LEFT HEART CATH AND CORONARY ANGIOGRAPHY;  Surgeon: Adrian Prows, MD;  Location: [facility];  Service: Cardiovascular;  Laterality: N/A;   POLYPECTOMY  11/29/2019   Procedure: POLYPECTOMY;  Surgeon: Rush Landmark Telford Nab., MD;  Location: [facility];  Service: Gastroenterology;;   RENAL ANGIOGRAPHY Bilateral 04/11/2019   Procedure: RENAL ANGIOGRAPHY;  Surgeon: Adrian Prows, MD;  Location: [facility];  Service: Cardiovascular;  Laterality: Bilateral;   SUBMUCOSAL LIFTING INJECTION  11/29/2019   Procedure: SUBMUCOSAL LIFTING INJECTION;  Surgeon: Irving Copas., MD;  Location: [facility];  Service: Gastroenterology;;   TOTAL KNEE ARTHROPLASTY Left 08/01/2019   Procedure: LEFT TOTAL KNEE ARTHROPLASTY;  Surgeon: Meredith Pel, MD;  Location: [facility];  Service: Orthopedics;  Laterality: Left;   HPI:  Pt is a 66 y.o. male who presented with 2-day history of left temporal headache along with right-sided blurred vision and word retrieval difficulty. Pt also reported to MD that he "feels that his speech seems like he is talking out of a tunnel." MRI brain: Acute infarct in the posterior mesial temporal  lobe, with additional scattered infarcts in the posterior left thalamus and  left occipital lobe, consistent with left PCA territory infarct. PMH: diabetes, hypertension, hyperlipidemia, obesity.   Assessment / Plan / Recommendation Clinical Impression   Pt was seen for speech-language evaluation. He reported that he is currently retired and lives alone. Pt denied any baseline deficits in speech, language, or cognition, but he cited acute changes in word retrieval, comprehension of complex information, and memory. Motor speech skills were WNL. Pt presented with mild aphasia characterized by deficits in receptive and expressive language. He participated in conversation, but additional processing time was necessary for word retrieval, and he exhibited increased difficulty with retrieval of lower-frequency words during structured tasks. Pt was able to accurately respond to simple and complex yes/no questions and to complete up to 3-step commands with additional processing time. However, he exhibited difficulty with reading comprehension at the sentence level, and increased difficulty was observed with auditory comprehension of paragraph-level material. The impact of visual deficits on his reading comprehension is questioned, but pt attributed his difficulty to both visual and linguistic processing.   Impairments in memory were questioned during cognitive screening. [facility] Mental Status Examination was, therefore, completed to evaluate the pt's cognitive-linguistic skills, but the impact of language impairments on his performance is considered. He achieved a score of 18/30 which is below the normal limits of 27 or more out of 30. He exhibited difficulty in the areas of memory, verbal fluency, and executive function. Skilled SLP services are clinically indicated at this time. The potential impact of pt's impairments on medication and financial management were discussed with the pt. Pt verbalized agreement with the need for assistance with these tasks to ensure accuracy.    SLP Assessment  SLP Recommendation/Assessment: Patient needs continued Speech Lanaguage Pathology Services SLP Visit Diagnosis: Aphasia (R47.01);Cognitive communication  deficit (R41.841)    Recommendations for follow up therapy are one component of a multi-disciplinary discharge planning process, led by the attending physician.  Recommendations may be updated based on patient status, additional functional criteria and insurance authorization.    Follow Up Recommendations  Outpatient SLP    Assistance Recommended at Discharge  Intermittent Supervision/Assistance  Functional Status Assessment Patient has had a recent decline in their functional status and demonstrates the ability to make significant improvements in function in a reasonable and predictable amount of time.  Frequency and Duration min 2x/week  2 weeks      SLP Evaluation Cognition  Overall Cognitive Status: Difficult to assess (the impact of language impairments on performance is questioned) Arousal/Alertness: Awake/alert Orientation Level: Oriented X4 Year: 2023 Month: January Day of Week: Correct Attention: Focused;Sustained;Selective Focused Attention: Appears intact Sustained Attention: Appears intact Selective Attention: Appears intact Memory: Impaired Memory Impairment: Retrieval deficit (Immediate: 5/5; delayed: 0/5; with cues: 2/5) Awareness: Appears intact Problem Solving: Appears intact (Money problems: 1/2) Executive Function: Sequencing;Organizing Sequencing: Impaired Sequencing Impairment: Verbal complex (clock drawing: 4/4 with additional processing time and self-correction) Organizing: Appears intact (backward digit span: 2/2 with additional processing time and self-correction)       Comprehension  Auditory Comprehension Overall Auditory Comprehension: Impaired Yes/No Questions: Impaired Basic Immediate Environment Questions:  (5/5) Complex Questions:  (5/5) Paragraph Comprehension (via yes/no questions):  (Open-ended questions: 2/4) Commands: Impaired Two Step Basic Commands:  (4/4; self-correction inconsistently necessary) Multistep Basic Commands:   (3/3) Conversation: Complex Reading Comprehension Reading Status: Impaired Word level: Within functional limits Sentence Level: Impaired Interfering Components:  (visual deficits)    Expression  Expression Primary Mode of Expression: Verbal Verbal Expression Overall Verbal Expression: Impaired Initiation: No impairment Automatic Speech: Day of week;Month of year;Counting Level of Generative/Spontaneous Verbalization: Conversation Repetition: No impairment (5/5) Naming: Impairment Responsive:  (5/5 with additional processing time) Confrontation: Impaired (high frequency words: 5/5; lower frequency: 3/5; 5/5 with cues) Convergent:  (Sentence completion: 5/5) Divergent:  (14 items in 1 minute) Written Expression Dominant Hand: Right   Oral / Motor  Oral Motor/Sensory Function Overall Oral Motor/Sensory Function: Within functional limits Motor Speech Overall Motor Speech: Appears within functional limits for tasks assessed Respiration: Within functional limits Phonation: Normal Resonance: Within functional limits Articulation: Within functional limitis Intelligibility: Intelligible Motor Planning: Witnin functional limits Motor Speech Errors: Not applicable           Juell Radney I. Hardin Negus, [place], [place] Office number [phone] Pager [place] 02/20/2021, 4:32 PM

## 2021-02-20 NOTE — Discharge Instructions (Addendum)
Medicare Outpatient Observation Notice   Patient name:  Adante Courington Patient number:  [id number]                                                                                                                                                                       [facility] a hospital outpatient receiving observation services. You are not an inpatient because:    CVA   You require hospital care for evaluation and/or treatment.  It is expected you will need hospital care for less than a total of two days.                                                                                                                                                                         Being an outpatient may affect what you pay in a hospital:   When youre a hospital outpatient, your observation stay is covered under Medicare Part B.   For Part B services, you generally pay:   A copayment for each outpatient hospital service you get. Part B copayments may vary by type of service.   20% of the Medicare-approved amount for most doctor services, after the Part B deductible.   Observation services may affect coverage and payment of your care after you leave the hospital:     If you need skilled nursing facility (SNF) care after you leave the hospital, Medicare Part A will only cover SNF care if youve had a 3-day minimum, medically necessary, inpatient hospital stay for a related illness or injury. An inpatient hospital stay begins the day the hospital admits you as an inpatient based on a doctors order and doesnt include the day youre discharged.   If you have Medicaid, a Medicare Advantage plan or other health plan, Medicaid or the plan may have different rules for SNF coverage after you leave the hospital. Check with Medicaid or your plan.   NOTE: Medicare Part A generally doesnt cover outpatient hospital services, like an  observation stay. However, Part A will generally cover medically necessary  inpatient services if the hospital admits you as an inpatient based on a doctors order. In most cases, youll pay a one-time deductible for all of your inpatient hospital services for the first 60 days youre in a hospital.                                                                                                                                                                      If you have any questions about your observation services, ask the hospital staff member giving you this notice or the doctor providing your hospital care. You can also ask to speak with someone from the hospitals utilization or discharge planning department.   You can also call 1-800-MEDICARE (1-504-839-0671).  TT[employer] users should call [phone].   Form CMS 49201-EOFH   Expiration 02/01/2021 OMB APPROVAL [id number]          Your costs for medications:     Generally, prescription and over-the-counter drugs, including self-administered drugs, you get in a hospital outpatient setting (like an emergency department) arent covered by Part B. Self- administered drugs are drugs youd normally take on your own. For safety reasons, many hospitals dont allow you to take medications brought from home. If you have a Medicare prescription drug plan (Part D), your plan may help you pay for these drugs. Youll likely need to pay out-of- pocket for these drugs and submit a claim to your drug plan for a refund. Contact your drug plan for more information.                                                                                                                                                                        If youre enrolled in a Medicare Advantage plan (like an HMO or PPO) or other Medicare health plan (Part C), your costs and coverage may be different. Check with your plan to find out about coverage for outpatient  observation services.    If youre a Chief of Staff through your state  Medicaid program, you cant be billed for Part A or Part B deductibles, coinsurance, and copayments.                                                                                                                                                                      Additional Information (Optional):                                                                                                                                                                                Please sign below to show you received and understand this notice.                                         Date: 02/20/21 / Time:11:36 AM   CMS does not discriminate in its programs and activities. To request this publication in alternative format, please call: 1-800-MEDICARE or email:[email or web address].   Form CMS 78242-PNTI   Expiration 02/01/2021 OMB APPROVAL [phone]

## 2021-02-20 NOTE — Hospital Course (Addendum)
66 y.o. male with a past medical history of hypertension and diabetes who was in his usual state of health about 2 days prior to admission when he developed a headache.  Also reported visual disturbances.  Brother also reported the patient has been more confused than usual.  There was also some episode where he was finding it difficult to find words to speak.  Patient was brought into the emergency department.  Imaging studies showed acute stroke.  Patient was hospitalized for further management.  Patient was seen by stroke service.

## 2021-02-20 NOTE — Evaluation (Signed)
Occupational Therapy Evaluation Patient Details Name: Joe Stewart MRN: [id number] DOB: [birth year] Today's Date: 02/20/2021   History of Present Illness Joe Stewart is a 66 y.o. male presented emergency department with visual deficit and confusion for 1 week.  Patient reports that he developed a headache, on the left temporal region,  which is resolved.  He said he felt woozy and lightheaded.  He noted that his vision seemed off, with missing pieces in his visual field at least 2 to 3 days a He lives by himself. His brother  reported that the patient seems confused with simple tasks today, such as closing the windows in the car, and also getting himself dressed   Clinical Impression   Pt presents with decline in function and safety with ADLs and ADL mobility with impaired balance, endurance and  L visual field.  PTA pt lived at home alone and was Ind with ADLs, IADLs, home mgt and was driving. Pt currently requires min guard A with mobility suing no AD and with LB ADLs. Pt with slow, guarded pace of movement and he reports that his mobility is now slower and guarded when walking because he feels "wobbly and off balance". Pt would benefit from acute OT services to address impairments to maximize level of function and safety     Recommendations for follow up therapy are one component of a multi-disciplinary discharge planning process, led by the attending physician.  Recommendations may be updated based on patient status, additional functional criteria and insurance authorization.   Follow Up Recommendations  No OT follow up    Assistance Recommended at Discharge Intermittent Supervision/Assistance  Patient can return home with the following Assist for transportation;Assistance with cooking/housework    Functional Status Assessment  Patient has had a recent decline in their functional status and demonstrates the ability to make significant improvements in function in a reasonable and predictable  amount of time.  Equipment Recommendations  None recommended by OT    Recommendations for Other Services PT consult     Precautions / Restrictions Precautions Precautions: Fall Restrictions Weight Bearing Restrictions: No      Mobility Bed Mobility Overal bed mobility: Modified Independent                  Transfers Overall transfer level: Needs assistance Equipment used: 1 person hand held assist Transfers: Sit to/from Stand, Bed to chair/wheelchair/BSC Sit to Stand: Min guard     Step pivot transfers: Min guard     General transfer comment: pt with slow, guarded pace of mobility and states that he feels "wobbly" even during static standing at sink      Balance Overall balance assessment: Needs assistance Sitting-balance support: No upper extremity supported, Feet supported Sitting balance-Leahy Scale: Good     Standing balance support: Single extremity supported, No upper extremity supported, During functional activity Standing balance-Leahy Scale: Fair                             ADL either performed or assessed with clinical judgement   ADL Overall ADL's : Needs assistance/impaired Eating/Feeding: Independent;Sitting   Grooming: Wash/dry hands;Wash/dry face;Min guard;Standing   Upper Body Bathing: Set up;Independent;Sitting   Lower Body Bathing: Minimal assistance Lower Body Bathing Details (indicate cue type and reason): simulated standig at sink Upper Body Dressing : Set up;Independent;Sitting   Lower Body Dressing: Minimal assistance   Toilet Transfer: Min guard;Supervision/safety;Ambulation;Regular Toilet   Toileting- Water quality scientist and Hygiene:  Min guard;Sit to/from stand       Functional mobility during ADLs: Min guard;Supervision/safety General ADL Comments: pt reports that his mobility is guarded when walking and that he feels "wobbly and off balance"     Vision Baseline Vision/History: 1 Wears glasses Ability  to See in Adequate Light: 0 Adequate Patient Visual Report: Other (comment);Peripheral vision impairment (pt reports that he sees "floaters") Vision Assessment?: Vision impaired- to be further tested in functional context Additional Comments: L visual field impaired     Perception     Praxis      Pertinent Vitals/Pain Pain Assessment Pain Assessment: No/denies pain     Hand Dominance Right   Extremity/Trunk Assessment Upper Extremity Assessment Upper Extremity Assessment: Overall WFL for tasks assessed   Lower Extremity Assessment Lower Extremity Assessment: Defer to PT evaluation   Cervical / Trunk Assessment Cervical / Trunk Assessment: Normal   Communication Communication Communication: No difficulties   Cognition Arousal/Alertness: Awake/alert Behavior During Therapy: WFL for tasks assessed/performed Overall Cognitive Status: Within Functional Limits for tasks assessed                                       General Comments       Exercises     Shoulder Instructions      Home Living Family/patient expects to be discharged to:: Private residence Living Arrangements: Alone Available Help at Discharge: Family;Friend(s) Type of Home: House Home Access: Stairs to enter [employer] of Steps: 2 Entrance Stairs-Rails: Left Home Layout: One level     Bathroom Shower/Tub: Teacher, early years/pre: Handicapped height     Home Equipment: Marine scientist - single point          Prior Functioning/Environment Prior Level of Function : Independent/Modified Independent                        OT Problem List: Decreased activity tolerance;Decreased knowledge of use of DME or AE;Impaired balance (sitting and/or standing);Decreased strength      OT Treatment/Interventions: Self-care/ADL training;Therapeutic exercise;Patient/family education;Balance training;Therapeutic activities;DME and/or AE instruction    OT  Goals(Current goals can be found in the care plan section) Acute Rehab OT Goals Patient Stated Goal: go home OT Goal Formulation: With patient/family Time For Goal Achievement: 03/06/21 Potential to Achieve Goals: Good ADL Goals Pt Will Perform Grooming: with supervision;with modified independence;standing Pt Will Perform Lower Body Bathing: with supervision;with modified independence;sit to/from stand Pt Will Perform Lower Body Dressing: with supervision;with modified independence;sit to/from stand Pt Will Transfer to Toilet: with supervision;with modified independence;ambulating Pt Will Perform Toileting - Clothing Manipulation and hygiene: with supervision;with modified independence;sit to/from stand Pt Will Perform Tub/Shower Transfer: with supervision;with modified independence;ambulating  OT Frequency: Min 2X/week    Co-evaluation              AM-PAC OT "6 Clicks" Daily Activity     Outcome Measure Help from another person eating meals?: None Help from another person taking care of personal grooming?: A Little Help from another person toileting, which includes using toliet, bedpan, or urinal?: A Little Help from another person bathing (including washing, rinsing, drying)?: A Little Help from another person to put on and taking off regular upper body clothing?: None Help from another person to put on and taking off regular lower body clothing?: A Little 6 Click Score: 20   End of  Session Equipment Utilized During Treatment: Gait belt  Activity Tolerance: Patient tolerated treatment well Patient left: in bed;with call bell/phone within reach;with bed alarm set;with family/visitor present  OT Visit Diagnosis: Unsteadiness on feet (R26.81);Muscle weakness (generalized) (M62.81)                Time: 7944-4619 OT Time Calculation (min): 29 min Charges:  OT General Charges $OT Visit: 1 Visit OT Evaluation $OT Eval Low Complexity: 1 Low OT Treatments $Therapeutic Activity:  8-22 mins    Britt Bottom 02/20/2021, 2:06 PM

## 2021-02-21 LAB — GLUCOSE, CAPILLARY
Glucose-Capillary: 183 mg/dL — ABNORMAL HIGH (ref 70–99)
Glucose-Capillary: 224 mg/dL — ABNORMAL HIGH (ref 70–99)
Glucose-Capillary: 214 mg/dL — ABNORMAL HIGH (ref 70–99)
Glucose-Capillary: 127 mg/dL — ABNORMAL HIGH (ref 70–99)
Glucose-Capillary: 185 mg/dL — ABNORMAL HIGH (ref 70–99)

## 2021-02-21 LAB — VITAMIN B12: Vitamin B-12: 416 pg/mL (ref 180–914)

## 2021-02-21 LAB — FOLATE: Folate: 18.1 ng/mL (ref >5.9)

## 2021-02-21 LAB — RPR: RPR Ser Ql: NONREACTIVE

## 2021-02-21 NOTE — Progress Notes (Signed)
°  Transition of Care [facility]) Screening Note   Patient Details  Name: Clydell Sposito Date of Birth: 01/06/1956   Transition of [facility]) CM/SW Contact:    Pollie Friar, RN Phone Number: 02/21/2021, 2:35 PM    Transition of Care Department [facility]) has reviewed patient. We will continue to monitor patient advancement through interdisciplinary progression rounds. If new patient transition needs arise, please place a TOC consult.

## 2021-02-21 NOTE — Progress Notes (Signed)
Patient has a small 0.6 cm x 0.6 cm chronic ulcer on his little toe R foot.  No drainage,  denies any pain.  Will continue to observe the wound.

## 2021-02-21 NOTE — Progress Notes (Signed)
Occupational Therapy Treatment Patient Details Name: Joe Stewart MRN: [id number] DOB: 01/10/1956 Today's Date: 02/21/2021   History of present illness Joe Stewart is a 66 y.o. male presented emergency department 1/18 with visual deficit and confusion for 1 week.  Patient reports that he developed a headache, on the left temporal region,  which is resolved.  He said he felt woozy and lightheaded.  He noted that his vision seemed off, with missing pieces in his visual field at least 2 to 3 days a He lives by himself. His brother  reported that the patient seems confused with simple tasks today, such as closing the windows in the car, and also getting himself dressed.  PMHx:  DM, diabetic retinopathy, HTN   OT comments  Pt sitting EOB upon arrival, RN reports that pt was up and in bathroom right before OT entered. Pt with visitor present Art gallery manager). Per PT, pt did not perform as well with mobility during PT eval yesterday afternoon than with OT eval in the morning with impaired R UE/hand incoordination more noticeable. OT session focused on R UE/hand coordination with hand writing tasks and on cognition with [employer] screening with pt having significant difficulty with clock drawing and pill box portions indicating impaired memory and ability to interpret information and instructions accurately and safely. Updating d/c recommendation to acute inpatient rehab as most beneficial d/c venue for pt before returning home   Recommendations for follow up therapy are one component of a multi-disciplinary discharge planning process, led by the attending physician.  Recommendations may be updated based on patient status, additional functional criteria and insurance authorization.    Follow Up Recommendations  Acute inpatient rehab (3hours/day)    Assistance Recommended at Discharge Intermittent Supervision/Assistance  Patient can return home with the following  Assist for transportation;Assistance with  cooking/housework;A little help with walking and/or transfers   Equipment Recommendations  Other (comment) (TBD at next venue of care)    Recommendations for Other Services      Precautions / Restrictions Precautions Precautions: Fall       Mobility Bed Mobility               General bed mobility comments: seated EOB upon arrival    Transfers Overall transfer level: Needs assistance Equipment used: 1 person hand held assist Transfers: Sit to/from Stand Sit to Stand: Min guard     Step pivot transfers: Min guard           Balance Overall balance assessment: Needs assistance Sitting-balance support: No upper extremity supported, Feet supported Sitting balance-Leahy Scale: Fair     Standing balance support: No upper extremity supported, During functional activity                               ADL either performed or assessed with clinical judgement   ADL                       Lower Body Dressing: Min guard;Sit to/from stand Lower Body Dressing Details (indicate cue type and reason): donned clena gown standing Toilet Transfer: Min guard;Ambulation;Regular Museum/gallery exhibitions officer and Hygiene: Min guard;Sit to/from stand       Functional mobility during ADLs: Min guard      Extremity/Trunk Assessment Upper Extremity Assessment Upper Extremity Assessment: Overall WFL for tasks assessed   Lower Extremity Assessment RLE Deficits / Details: incoordination and mild weakness, Muscle fatigue  during handwriting activity observed RLE Coordination: decreased fine motor        Vision Baseline Vision/History: 1 Wears glasses Patient Visual Report: Other (comment);Peripheral vision impairment     Perception     Praxis      Cognition Arousal/Alertness: Awake/alert Behavior During Therapy: WFL for tasks assessed/performed                                   General Comments: re assessed congition  today as pt did not perform as well with PT eval yesterday afternooon after OT eval in the morning. Used [employer] with pt scoring 3/10 with diffculties in areas of clock drawing and pillbox        Exercises      Shoulder Instructions       General Comments      Pertinent Vitals/ Pain       Pain Assessment Pain Assessment: No/denies pain  Home Living                                          Prior Functioning/Environment              Frequency  Min 2X/week        Progress Toward Goals  OT Goals(current goals can now be found in the care plan section)  Progress towards OT goals: Progressing toward goals     Plan Discharge plan needs to be updated    Co-evaluation                 AM-PAC OT "6 Clicks" Daily Activity     Outcome Measure   Help from another person eating meals?: None Help from another person taking care of personal grooming?: A Little Help from another person toileting, which includes using toliet, bedpan, or urinal?: A Little Help from another person bathing (including washing, rinsing, drying)?: A Little Help from another person to put on and taking off regular upper body clothing?: None Help from another person to put on and taking off regular lower body clothing?: A Little 6 Click Score: 20    End of Session    OT Visit Diagnosis: Unsteadiness on feet (R26.81);Muscle weakness (generalized) (M62.81);Other symptoms and signs involving cognitive function   Activity Tolerance Patient tolerated treatment well   Patient Left in bed;with call bell/phone within reach;with bed alarm set;with family/visitor present   Nurse Communication          Time: 4235-3614 OT Time Calculation (min): 35 min  Charges: OT General Charges $OT Visit: 1 Visit OT Treatments $Self Care/Home Management : 8-22 mins $Therapeutic Activity: 8-22 mins    Britt Bottom 02/21/2021, 4:18 PM

## 2021-02-21 NOTE — Progress Notes (Addendum)
STROKE TEAM PROGRESS NOTE   INTERVAL HISTORY His friend is at the bedside.  Patient reports he is feeling less confused and strength is improving.   PT/OT recommend CIR. CIR insurance authorization pending.    Vitals:   02/20/21 2342 02/21/21 0340 02/21/21 0831 02/21/21 1204  BP: 125/80 (!) 164/90 (!) 158/68 93/60  Pulse: 70 74 78 80  Resp: 16 20 18 18   Temp: 98.2 F (36.8 C) 98.6 F (37 C) 98.5 F (36.9 C) 98.4 F (36.9 C)  TempSrc: Oral Oral Oral Oral  SpO2: 96% 97% 97% 98%   CBC:  Recent Labs  Lab 02/19/21 1345 02/19/21 1403 02/20/21 0455  WBC 7.4  --  7.5  NEUTROABS 4.2  --   --   HGB 14.6 14.6 14.0  HCT 42.5 43.0 40.1  MCV 90.6  --  89.9  PLT 219  --  818    Basic Metabolic Panel:  Recent Labs  Lab 02/19/21 1345 02/19/21 1403 02/20/21 0455  NA 135 135 137  K 4.4 4.4 3.9  CL 99 100 100  CO2 25  --  28  GLUCOSE 304* 307* 162*  BUN 18 21 15   CREATININE 1.06 1.00 1.01  CALCIUM 9.4  --  8.9    Lipid Panel:  Recent Labs  Lab 02/20/21 0455  CHOL 234*  TRIG 410*  HDL 27*  CHOLHDL 8.7  VLDL UNABLE TO CALCULATE IF TRIGLYCERIDE OVER 400 mg/dL  LDLCALC UNABLE TO CALCULATE IF TRIGLYCERIDE OVER 400 mg/dL    HgbA1c:  Recent Labs  Lab 02/20/21 0455  HGBA1C 12.3*    Urine Drug Screen:  Recent Labs  Lab 02/19/21 1915  LABOPIA NONE DETECTED  COCAINSCRNUR NONE DETECTED  LABBENZ NONE DETECTED  AMPHETMU NONE DETECTED  THCU NONE DETECTED  LABBARB NONE DETECTED     Alcohol Level  Recent Labs  Lab 02/19/21 1345  ETH <10     IMAGING past 24 hours No results found.  PHYSICAL EXAM  Temp:  [98.2 F (36.8 C)-99 F (37.2 C)] 98.4 F (36.9 C) (01/20 1204) Pulse Rate:  [70-83] 80 (01/20 1204) Resp:  [16-20] 18 (01/20 1204) BP: (93-164)/(60-90) 93/60 (01/20 1204) SpO2:  [96 %-98 %] 98 % (01/20 1204)  General -obese middle-aged Caucasian male in no apparent distress.  Cardiovascular - Regular rhythm and rate.  Mental Status -  Level of  arousal and orientation to time, place, and person were intact. Language including expression, naming, repetition, comprehension was assessed and found intact. Attention span and concentration were normal. 0/3 words recalled in short term memory test. Fund of Knowledge was assessed and was intact.  Cranial Nerves II - XII - II - Right outer quadrant, numbness Quadrant visual deficit. III, IV, VI - Extraocular movements intact. V - Facial sensation intact bilaterally. VII - Facial movement intact bilaterally. VIII - Hearing & vestibular intact bilaterally. X - Palate elevates symmetrically. XI - Chin turning & shoulder shrug intact bilaterally. XII - Tongue protrusion intact.  Motor Strength - RLE slightly weaker than left but able to make antigravity movements.  Bulk was normal and fasciculations were absent.   Motor Tone - Muscle tone was assessed at the neck and appendages and was normal.  Reflexes - The patients reflexes were symmetrical in all extremities and he had no pathological reflexes.  Sensory - Light touch, temperature/pinprick were assessed and were symmetrical.    Coordination - The patient had normal movements in the hands and feet with no ataxia or dysmetria.  Tremor was absent.  Gait and Station - deferred.  ASSESSMENT/PLAN Mr. Joe Stewart is a 66 y.o. male with history of medication noncompliance, HTN, uncontrolledT2DM, HLD presenting with .   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  MRI shows acute infarct in the posterior mesial temporal lobe, with additional scattered infarcts in the posterior left thalamus and left occipital lobe, consistent with left PCA territory infarct.  MRA shows poor visualization of the left PCA, with non opacification of the proximal left P2, as well as  P3 segments. Poor opacification of the left P1 and more distal P2 segment.  Echo EF 70-75% with concentric LV hypertrophy.  Direct LDL 124.9 A1c 12.3 VTE prophylaxis - Lovenox    Diet   Diet heart healthy/carb modified Room service appropriate? Yes; Fluid consistency: Thin   No antithrombotic prior to admission, now on aspirin 81 mg daily and clopidogrel 75 mg daily x3 weeks then ASA indefinitely Therapy recommendations:  CIR Disposition:  pending  Hypertension Home meds:  none Permissive hypertension (OK if < 220/120) but gradually normalize in 5-7 days Long-term BP goal normotensive  Hyperlipidemia Home meds:  none Direct LDL 124.9  Lipitor 80 added Continue statin at discharge  Diabetes type II Uncontrolled Home meds:  none HgbA1c 12.3, goal < 7.0 CBGs Recent Labs    02/21/21 0615 02/21/21 0837 02/21/21 1206  GLUCAP 183* 224* 214*     SSI  Other Stroke Risk Factors Advanced Age >/= 65  Obesity, There is no height or weight on file to calculate BMI., BMI >/= 30 associated with increased stroke risk, recommend weight loss, diet and exercise as appropriate    Hospital day # 1  France Ravens, MD PGY1 Resident I have personally obtained history,examined this patient, reviewed notes, independently viewed imaging studies, participated in medical decision making and plan of care.ROS completed by me personally and pertinent positives fully documented  I have made any additions or clarifications directly to the above note. Agree with note above.  Continue aspirin and Plavix for 3 weeks followed by aspirin alone and aggressive risk factor modification.  Recommend 30-day heart monitor for paroxysmal A. fib on discharge.  Transfer to inpatient rehab when bed available.  Follow-up with outpatient stroke clinic in 2 months.  Stroke team will sign off.  Kindly call for questions.  Discussed with Dr. [place] Pink.  Greater than 50% time during this 35-minute visit was spent in counseling and  coordination of care about stroke discussion about stroke prevention and A. fib and answering questions.  Antony Contras, MD Medical Director [facility] Pager: [phone] 02/21/2021 3:18 PM    To contact Stroke Continuity provider, please refer to [email or web address] After hours, contact General Neurology

## 2021-02-21 NOTE — Progress Notes (Signed)
[facility] PROGRESS NOTE   Joe Stewart EVO:[id number] DOB: [birth year] DOA: 02/19/2021  1 DOS: the patient was seen and examined on 02/21/2021  PCP: [name], NP  Brief History and Hospital Course:  66 y.o. male with a past medical history of hypertension and diabetes who was in his usual state of health about 2 days prior to admission when he developed a headache.  Also reported visual disturbances.  Brother also reported the patient has been more confused than usual.  There was also some episode where he was finding it difficult to find words to speak.  Patient was brought into the emergency department.  Imaging studies showed acute stroke.  Patient was hospitalized for further management.  Patient was seen by stroke service.  Subsequently seen by physical therapy who recommended inpatient rehabilitation.  Consultants: Neurology  Procedures: Transthoracic echocardiogram    Subjective: Patient mentions that his vision seems to be improving.  Denies any other complaint such as shortness of breath nausea or vomiting.  No chest pain    Assessment/Plan:  * Acute CVA (cerebrovascular accident) ([place])- (present on admission) CT head raises concern for left temporal lobe infarct.  No hemorrhage was noted.    MRI brain showed acute infarct in the posterior mesial temporal lobe.  Additional infarcts were noted all in the left PCA territory.  Other vascular abnormalities were also noted. LDL 124.  HbA1c 12.3. Echocardiogram shows moderate LVH with a EF of 70 to 75%.  Grade 1 diastolic dysfunction noted. Patient seen by the stroke service.  Plan is to place him on aspirin and Plavix for 3 weeks followed by aspirin alone. Seen by PT and OT.  Did not do well with physical therapy with unsteady gait and loss of balance.  They are recommending inpatient rehabilitation.  Inpatient rehab has been consulted. Patient also exhibiting some signs of cognitive impairment.  This will need  further work-up in the outpatient setting.  E99 folic acid levels were normal.  TSH was normal.  RPR is pending.   Waiting on rehab input.  Essential hypertension- (present on admission) Patient has been noncompliant with his antihypertensives.  Currently permissive hypertension is being allowed. He has not been taking any of his antihypertensives including amlodipine hydrochlorothiazide losartan and metoprolol.   Based on previous records he is followed by [facility].  He has known right bundle branch block.  Had a cardiac cath in 2021 which showed diffuse disease but was being managed medically.  Did not require any PCI.  Does not take aspirin on a daily basis per patient. Continue to monitor blood pressures.  Type 2 diabetes mellitus without complication, without long-term current use of insulin (HCC) Poorly controlled suggested by HbA1c of 12.3.  Has not been taking any of his oral medications.  Continue SSI.  Will need to resume his oral medications at discharge.    Hyperlipidemia- (present on admission) LDL noted to be 124.  Started back on statin.    Obesity Estimated body mass index is 39.38 kg/m as calculated from the following:   Height as of 01/29/20: 5\' 8"  (1.727 m).   Weight as of 01/29/20: 117.5 kg.    DVT Prophylaxis: Lovenox Code Status: Full code Family Communication: Discussed with patient's brother yesterday.  Will update later today. Disposition Plan: To be determined  Status is: Inpatient  Remains inpatient appropriate because: Acute stroke.  Imbalance.        Medications: Scheduled:   stroke: mapping our early stages of recovery  book   Does not apply Once   aspirin  325 mg Oral Daily   atorvastatin  80 mg Oral Daily   clopidogrel  75 mg Oral Daily   enoxaparin (LOVENOX) injection  40 mg Subcutaneous Q24H   insulin aspart  0-20 Units Subcutaneous TID WC   insulin aspart  0-5 Units Subcutaneous QHS   Continuous: LKG:[id number]  **OR** acetaminophen (TYLENOL) oral liquid 160 mg/5 mL **OR** acetaminophen  Antibiotics: Anti-infectives (From admission, onward)    None       Objective:  Vital Signs  Vitals:   02/20/21 2030 02/20/21 2342 02/21/21 0340 02/21/21 0831  BP: (!) 149/84 125/80 (!) 164/90 (!) 158/68  Pulse: 75 70 74 78  Resp: 16 16 20 18   Temp: 98.2 F (36.8 C) 98.2 F (36.8 C) 98.6 F (37 C) 98.5 F (36.9 C)  TempSrc: Oral Oral Oral Oral  SpO2: 98% 96% 97% 97%    Intake/Output Summary (Last 24 hours) at 02/21/2021 1100 Last data filed at 02/20/2021 1300 Gross per 24 hour  Intake 240 ml  Output --  Net 240 ml    There were no vitals filed for this visit.   General appearance: Awake alert.  In no distress.  Mildly distracted Resp: Clear to auscultation bilaterally.  Normal effort Cardio: S1-S2 is normal regular.  No S3-S4.  No rubs murmurs or bruit GI: Abdomen is soft.  Nontender nondistended.  Bowel sounds are present normal.  No masses organomegaly Extremities: No edema.  Full range of motion of lower extremities. Neurologic: No focal neurological deficits.      Lab Results:  Data Reviewed: I have personally reviewed labs and imaging study reports  CBC: Recent Labs  Lab 02/19/21 1345 02/19/21 1403 02/20/21 0455  WBC 7.4  --  7.5  NEUTROABS 4.2  --   --   HGB 14.6 14.6 14.0  HCT 42.5 43.0 40.1  MCV 90.6  --  89.9  PLT 219  --  201     Basic Metabolic Panel: Recent Labs  Lab 02/19/21 1345 02/19/21 1403 02/20/21 0455  NA 135 135 137  K 4.4 4.4 3.9  CL 99 100 100  CO2 25  --  28  GLUCOSE 304* 307* 162*  BUN 18 21 15   CREATININE 1.06 1.00 1.01  CALCIUM 9.4  --  8.9     GFR: CrCl cannot be calculated (Unknown ideal weight.).  Liver Function Tests: Recent Labs  Lab 02/19/21 1345 02/20/21 0455  AST 36 33  ALT 32 29  ALKPHOS 69 64  BILITOT 0.7 0.6  PROT 7.7 7.0  ALBUMIN 3.8 3.4*      Coagulation Profile: Recent Labs  Lab 02/19/21 1345  INR  1.1      HbA1C: Recent Labs    02/20/21 0455  HGBA1C 12.3*     CBG: Recent Labs  Lab 02/20/21 1128 02/20/21 1642 02/20/21 2205 02/21/21 0615 02/21/21 0837  GLUCAP 217* 186* 164* 183* 224*     Lipid Profile: Recent Labs    02/20/21 0455  CHOL 234*  HDL 27*  LDLCALC UNABLE TO CALCULATE IF TRIGLYCERIDE OVER 400 mg/dL  TRIG 410*  CHOLHDL 8.7  LDLDIRECT 124.9*     Thyroid Function Tests: Recent Labs    02/20/21 0455  TSH 3.683     Anemia Panel: Recent Labs    02/21/21 0336  VITAMINB12 416  FOLATE 18.1    Recent Results (from the past 240 hour(s))  Resp Panel by RT-PCR (Flu A&B, Covid)  Nasopharyngeal Swab     Status: None   Collection Time: 02/19/21  1:39 PM   Specimen: Nasopharyngeal Swab; Nasopharyngeal(NP) swabs in vial transport medium  Result Value Ref Range Status   SARS Coronavirus 2 by RT PCR NEGATIVE NEGATIVE Final    Comment: (NOTE) SARS-CoV-2 target nucleic acids are NOT DETECTED.  The SARS-CoV-2 RNA is generally detectable in upper respiratory specimens during the acute phase of infection. The lowest concentration of SARS-CoV-2 viral copies this assay can detect is 138 copies/mL. A negative result does not preclude SARS-Cov-2 infection and should not be used as the sole basis for treatment or other patient management decisions. A negative result may occur with  improper specimen collection/handling, submission of specimen other than nasopharyngeal swab, presence of viral mutation(s) within the areas targeted by this assay, and inadequate number of viral copies(<138 copies/mL). A negative result must be combined with clinical observations, patient history, and epidemiological information. The expected result is Negative.  Fact Sheet for Patients:  EntrepreneurPulse.com.au  Fact Sheet for Healthcare Providers:  IncredibleEmployment.be  This test is no t yet approved or cleared by the [place]  FDA and  has been authorized for detection and/or diagnosis of SARS-CoV-2 by FDA under an Emergency Use Authorization (EUA). This EUA will remain  in effect (meaning this test can be used) for the duration of the COVID-19 declaration under Section 564(b)(1) of the Act, 21 U.S.C.section 360bbb-3(b)(1), unless the authorization is terminated  or revoked sooner.       Influenza A by PCR NEGATIVE NEGATIVE Final   Influenza B by PCR NEGATIVE NEGATIVE Final    Comment: (NOTE) The Xpert Xpress SARS-CoV-2/FLU/RSV plus assay is intended as an aid in the diagnosis of influenza from Nasopharyngeal swab specimens and should not be used as a sole basis for treatment. Nasal washings and aspirates are unacceptable for Xpert Xpress SARS-CoV-2/FLU/RSV testing.  Fact Sheet for Patients: EntrepreneurPulse.com.au  Fact Sheet for Healthcare Providers: IncredibleEmployment.be  This test is not yet approved or cleared by the [place] FDA and has been authorized for detection and/or diagnosis of SARS-CoV-2 by FDA under an Emergency Use Authorization (EUA). This EUA will remain in effect (meaning this test can be used) for the duration of the COVID-19 declaration under Section 564(b)(1) of the Act, 21 U.S.C. section 360bbb-3(b)(1), unless the authorization is terminated or revoked.  Performed at [facility], [place] [address] [place], [place] [address]        Radiology Studies: CT HEAD WO CONTRAST  Result Date: 02/19/2021 CLINICAL DATA:  TIA.  Aphasia. EXAM: CT HEAD WITHOUT CONTRAST TECHNIQUE: Contiguous axial images were obtained from the base of the skull through the vertex without intravenous contrast. RADIATION DOSE REDUCTION: This exam was performed according to the departmental dose-optimization program which includes automated exposure control, adjustment of the mA and/or kV according to patient size and/or use of iterative reconstruction  technique. COMPARISON:  None. FINDINGS: Brain: There is hypoattenuation involving cortex and white matter in the posterior mesial left temporal lobe. No intracranial hemorrhage, midline shift, or extra-axial fluid collection is identified. The ventricles are normal in size. Vascular: Calcified atherosclerosis at the skull base. No hyperdense vessel. Skull: No fracture or suspicious osseous lesion. Sinuses/Orbits: Mild mucosal thickening in the right maxillary sinus. Clear mastoid air cells. Unremarkable orbits. Other: None. IMPRESSION: 1. Left temporal lobe hypodensity most suggestive of an acute or subacute PCA infarct. Brain MRI is recommended for further evaluation. 2. No intracranial hemorrhage. Electronically Signed   By:  Logan Bores M.D.   On: 02/19/2021 14:17   MR ANGIO HEAD WO CONTRAST  Result Date: 02/19/2021 CLINICAL DATA:  Stroke, TIA, determine embolic source; headache and vision impairment with 2 days of blurred vision EXAM: MRI HEAD WITHOUT CONTRAST MRA HEAD WITHOUT CONTRAST MRA NECK WITHOUT AND WITH CONTRAST TECHNIQUE: Multiplanar, multi-echo pulse sequences of the brain and surrounding structures were acquired without intravenous contrast. Angiographic images of the Circle of Willis were acquired using MRA technique without intravenous contrast. Angiographic images of the neck were acquired using MRA technique without and with intravenous contrast. Carotid stenosis measurements (when applicable) are obtained utilizing NASCET criteria, using the distal internal carotid diameter as the denominator. CONTRAST:  36mL GADAVIST GADOBUTROL 1 MMOL/ML IV SOLN COMPARISON:  No prior MRI or MRA. Correlation is made with CT head 02/19/2021 FINDINGS: MRI HEAD FINDINGS Brain: Restricted diffusion with ADC correlate in the mesial left temporal lobe, measuring up to 4.3 x 2.2 x 2.2 cm (series 5, image 73 and series 7, image 48), which correlates with the hypodensity seen on the same-day CT. Additional scattered  foci of restricted diffusion with ADC correlates in the left occipital lobe (series 5, image 75 and 76) as well as in the posterior left thalamus (series 5, image 77). No acute hemorrhage, mass, mass effect, or midline shift. Mildly increased T2 signal is associated with the previously mentioned areas of restricted diffusion, likely mild edema. No hydrocephalus or extra-axial collection. Vascular: Please see MRA findings below Skull and upper cervical spine: Normal marrow signal. Possible mild spinal canal stenosis C2-C3. Sinuses/Orbits: No acute or significant finding. Other: The mastoids are well aerated. MRA HEAD FINDINGS Anterior circulation: Both internal carotid arteries are patent to the termini, without stenosis or other abnormality. A1 segments patent, with mild focal narrowing in the mid right A1 (series 1, image 101). Normal anterior communicating artery. Anterior cerebral arteries are patent to their distal aspects. No M1 stenosis or occlusion. Normal MCA bifurcations. Distal MCA branches perfused and symmetric. Posterior circulation: Vertebral arteries patent to the vertebrobasilar junction without stenosis. Right dominant. Basilar patent to its distal aspect. Superior cerebellar arteries patent bilaterally. Patent short right P1, with prominent right posterior communicating artery. The right PCA is well perfused. Poor visualization of the left P1, which likely shares a common origin with the left SCA (series 1, image 71) poor opacification of the P1 proximally (series 1, image 75-80), with patent left posterior communicating artery. Focal non opacification of the left P2 (series 1 images 85-89). Poor perfusion of the remainder of the left P2 (series 1, image 87), with nonvisualization of the left P3 segments. Anatomic variants: None significant MRA NECK FINDINGS Aortic arch: Three-vessel arch. No evidence of aneurysm, dissection, or significant proximal stenosis. Right carotid system: Patent, without  significant stenosis, dissection, or occlusion. Left carotid system: Patent, without significant stenosis, dissection, or occlusion. Vertebral arteries: The right vertebral artery is visualized from its origin to the skull base. There is significant tortuosity in the midcervical spine (series 11, image 49). The left vertebral artery is diminutive and minimally visualized from just distal to its origin to the skull base, although it does appear patent. The origin of the left vertebral artery is not well evaluated, likely secondary to artifact. Other: None IMPRESSION: 1. Acute infarct in the posterior mesial temporal lobe, with additional scattered infarcts in the posterior left thalamus and left occipital lobe, consistent with left PCA territory infarct. 2. Poor visualization of the left PCA, with non opacification of the  proximal left P2, as well as P3 segments. Poor opacification of the left P1 and more distal P2 segment. No other significant intracranial stenosis. 3. Diminutive left vertebral artery, which is not dominant. This may be congenital and or may represent atherosclerotic stenosis. No other significant stenosis in the neck. Code stroke imaging results were communicated on 02/19/2021 at 6:38 pm to provider Dr. Lorrin Goodell via secure text paging. Electronically Signed   By: Merilyn Baba M.D.   On: 02/19/2021 18:39   MR Angiogram Neck W or Wo Contrast  Result Date: 02/19/2021 CLINICAL DATA:  Stroke, TIA, determine embolic source; headache and vision impairment with 2 days of blurred vision EXAM: MRI HEAD WITHOUT CONTRAST MRA HEAD WITHOUT CONTRAST MRA NECK WITHOUT AND WITH CONTRAST TECHNIQUE: Multiplanar, multi-echo pulse sequences of the brain and surrounding structures were acquired without intravenous contrast. Angiographic images of the Circle of Willis were acquired using MRA technique without intravenous contrast. Angiographic images of the neck were acquired using MRA technique without and with  intravenous contrast. Carotid stenosis measurements (when applicable) are obtained utilizing NASCET criteria, using the distal internal carotid diameter as the denominator. CONTRAST:  21mL GADAVIST GADOBUTROL 1 MMOL/ML IV SOLN COMPARISON:  No prior MRI or MRA. Correlation is made with CT head 02/19/2021 FINDINGS: MRI HEAD FINDINGS Brain: Restricted diffusion with ADC correlate in the mesial left temporal lobe, measuring up to 4.3 x 2.2 x 2.2 cm (series 5, image 73 and series 7, image 48), which correlates with the hypodensity seen on the same-day CT. Additional scattered foci of restricted diffusion with ADC correlates in the left occipital lobe (series 5, image 75 and 76) as well as in the posterior left thalamus (series 5, image 77). No acute hemorrhage, mass, mass effect, or midline shift. Mildly increased T2 signal is associated with the previously mentioned areas of restricted diffusion, likely mild edema. No hydrocephalus or extra-axial collection. Vascular: Please see MRA findings below Skull and upper cervical spine: Normal marrow signal. Possible mild spinal canal stenosis C2-C3. Sinuses/Orbits: No acute or significant finding. Other: The mastoids are well aerated. MRA HEAD FINDINGS Anterior circulation: Both internal carotid arteries are patent to the termini, without stenosis or other abnormality. A1 segments patent, with mild focal narrowing in the mid right A1 (series 1, image 101). Normal anterior communicating artery. Anterior cerebral arteries are patent to their distal aspects. No M1 stenosis or occlusion. Normal MCA bifurcations. Distal MCA branches perfused and symmetric. Posterior circulation: Vertebral arteries patent to the vertebrobasilar junction without stenosis. Right dominant. Basilar patent to its distal aspect. Superior cerebellar arteries patent bilaterally. Patent short right P1, with prominent right posterior communicating artery. The right PCA is well perfused. Poor visualization of  the left P1, which likely shares a common origin with the left SCA (series 1, image 71) poor opacification of the P1 proximally (series 1, image 75-80), with patent left posterior communicating artery. Focal non opacification of the left P2 (series 1 images 85-89). Poor perfusion of the remainder of the left P2 (series 1, image 87), with nonvisualization of the left P3 segments. Anatomic variants: None significant MRA NECK FINDINGS Aortic arch: Three-vessel arch. No evidence of aneurysm, dissection, or significant proximal stenosis. Right carotid system: Patent, without significant stenosis, dissection, or occlusion. Left carotid system: Patent, without significant stenosis, dissection, or occlusion. Vertebral arteries: The right vertebral artery is visualized from its origin to the skull base. There is significant tortuosity in the midcervical spine (series 11, image 49). The left vertebral artery is diminutive  and minimally visualized from just distal to its origin to the skull base, although it does appear patent. The origin of the left vertebral artery is not well evaluated, likely secondary to artifact. Other: None IMPRESSION: 1. Acute infarct in the posterior mesial temporal lobe, with additional scattered infarcts in the posterior left thalamus and left occipital lobe, consistent with left PCA territory infarct. 2. Poor visualization of the left PCA, with non opacification of the proximal left P2, as well as P3 segments. Poor opacification of the left P1 and more distal P2 segment. No other significant intracranial stenosis. 3. Diminutive left vertebral artery, which is not dominant. This may be congenital and or may represent atherosclerotic stenosis. No other significant stenosis in the neck. Code stroke imaging results were communicated on 02/19/2021 at 6:38 pm to provider Dr. Lorrin Goodell via secure text paging. Electronically Signed   By: Merilyn Baba M.D.   On: 02/19/2021 18:39   MR BRAIN WO  CONTRAST  Result Date: 02/19/2021 CLINICAL DATA:  Stroke, TIA, determine embolic source; headache and vision impairment with 2 days of blurred vision EXAM: MRI HEAD WITHOUT CONTRAST MRA HEAD WITHOUT CONTRAST MRA NECK WITHOUT AND WITH CONTRAST TECHNIQUE: Multiplanar, multi-echo pulse sequences of the brain and surrounding structures were acquired without intravenous contrast. Angiographic images of the Circle of Willis were acquired using MRA technique without intravenous contrast. Angiographic images of the neck were acquired using MRA technique without and with intravenous contrast. Carotid stenosis measurements (when applicable) are obtained utilizing NASCET criteria, using the distal internal carotid diameter as the denominator. CONTRAST:  30mL GADAVIST GADOBUTROL 1 MMOL/ML IV SOLN COMPARISON:  No prior MRI or MRA. Correlation is made with CT head 02/19/2021 FINDINGS: MRI HEAD FINDINGS Brain: Restricted diffusion with ADC correlate in the mesial left temporal lobe, measuring up to 4.3 x 2.2 x 2.2 cm (series 5, image 73 and series 7, image 48), which correlates with the hypodensity seen on the same-day CT. Additional scattered foci of restricted diffusion with ADC correlates in the left occipital lobe (series 5, image 75 and 76) as well as in the posterior left thalamus (series 5, image 77). No acute hemorrhage, mass, mass effect, or midline shift. Mildly increased T2 signal is associated with the previously mentioned areas of restricted diffusion, likely mild edema. No hydrocephalus or extra-axial collection. Vascular: Please see MRA findings below Skull and upper cervical spine: Normal marrow signal. Possible mild spinal canal stenosis C2-C3. Sinuses/Orbits: No acute or significant finding. Other: The mastoids are well aerated. MRA HEAD FINDINGS Anterior circulation: Both internal carotid arteries are patent to the termini, without stenosis or other abnormality. A1 segments patent, with mild focal narrowing in  the mid right A1 (series 1, image 101). Normal anterior communicating artery. Anterior cerebral arteries are patent to their distal aspects. No M1 stenosis or occlusion. Normal MCA bifurcations. Distal MCA branches perfused and symmetric. Posterior circulation: Vertebral arteries patent to the vertebrobasilar junction without stenosis. Right dominant. Basilar patent to its distal aspect. Superior cerebellar arteries patent bilaterally. Patent short right P1, with prominent right posterior communicating artery. The right PCA is well perfused. Poor visualization of the left P1, which likely shares a common origin with the left SCA (series 1, image 71) poor opacification of the P1 proximally (series 1, image 75-80), with patent left posterior communicating artery. Focal non opacification of the left P2 (series 1 images 85-89). Poor perfusion of the remainder of the left P2 (series 1, image 87), with nonvisualization of the left P3 segments.  Anatomic variants: None significant MRA NECK FINDINGS Aortic arch: Three-vessel arch. No evidence of aneurysm, dissection, or significant proximal stenosis. Right carotid system: Patent, without significant stenosis, dissection, or occlusion. Left carotid system: Patent, without significant stenosis, dissection, or occlusion. Vertebral arteries: The right vertebral artery is visualized from its origin to the skull base. There is significant tortuosity in the midcervical spine (series 11, image 49). The left vertebral artery is diminutive and minimally visualized from just distal to its origin to the skull base, although it does appear patent. The origin of the left vertebral artery is not well evaluated, likely secondary to artifact. Other: None IMPRESSION: 1. Acute infarct in the posterior mesial temporal lobe, with additional scattered infarcts in the posterior left thalamus and left occipital lobe, consistent with left PCA territory infarct. 2. Poor visualization of the left PCA,  with non opacification of the proximal left P2, as well as P3 segments. Poor opacification of the left P1 and more distal P2 segment. No other significant intracranial stenosis. 3. Diminutive left vertebral artery, which is not dominant. This may be congenital and or may represent atherosclerotic stenosis. No other significant stenosis in the neck. Code stroke imaging results were communicated on 02/19/2021 at 6:38 pm to provider Dr. Lorrin Goodell via secure text paging. Electronically Signed   By: Merilyn Baba M.D.   On: 02/19/2021 18:39   ECHOCARDIOGRAM COMPLETE  Result Date: 02/20/2021    ECHOCARDIOGRAM REPORT   Patient Name:   Joe Stewart Date of Exam: 02/20/2021 Medical Rec #:  [id number]    Height:       68.0 in Accession #:    [id number]   Weight:       259.0 lb Date of Birth:  [birth year]     BSA:          2.281 m Patient Age:    44 years     BP:           162/79 mmHg Patient Gender: M            HR:           78 bpm. Exam Location:  Inpatient Procedure: 2D Echo, Cardiac Doppler and Color Doppler Indications:    Stroke  History:        Patient has no prior history of Echocardiogram examinations.                 Signs/Symptoms:Shortness of Breath and Dyspnea; Risk                 Factors:Hypertension, Diabetes and Dyslipidemia.  Sonographer:    Roseanna Rainbow RDCS Referring Phys: [id number] [facility]  Sonographer Comments: Technically difficult study due to poor echo windows and patient is morbidly obese. Image acquisition challenging due to patient body habitus. IMPRESSIONS  1. Moderate concentric LVH with proximal septal thickening.  2. Left ventricular ejection fraction, by estimation, is 70 to 75%. The left ventricle has hyperdynamic function. The left ventricle has no regional wall motion abnormalities. There is moderate concentric left ventricular hypertrophy. Left ventricular diastolic parameters are consistent with Grade I diastolic dysfunction (impaired relaxation). Elevated left atrial pressure.  3. Right  ventricular systolic function is normal. The right ventricular size is normal.  4. The mitral valve is normal in structure. No evidence of mitral valve regurgitation. No evidence of mitral stenosis.  5. The aortic valve is tricuspid. Aortic valve regurgitation is not visualized. Aortic valve sclerosis/calcification is present, without any evidence of aortic stenosis.  6. The  inferior vena cava is normal in size with greater than 50% respiratory variability, suggesting right atrial pressure of 3 mmHg. Comparison(s): No prior Echocardiogram. FINDINGS  Left Ventricle: Left ventricular ejection fraction, by estimation, is 70 to 75%. The left ventricle has hyperdynamic function. The left ventricle has no regional wall motion abnormalities. The left ventricular internal cavity size was normal in size. There is moderate concentric left ventricular hypertrophy. Left ventricular diastolic parameters are consistent with Grade I diastolic dysfunction (impaired relaxation). Elevated left atrial pressure. Right Ventricle: The right ventricular size is normal. Right ventricular systolic function is normal. Left Atrium: Left atrial size was normal in size. Right Atrium: Right atrial size was normal in size. Pericardium: There is no evidence of pericardial effusion. Mitral Valve: The mitral valve is normal in structure. Mild mitral annular calcification. No evidence of mitral valve regurgitation. No evidence of mitral valve stenosis. Tricuspid Valve: The tricuspid valve is normal in structure. Tricuspid valve regurgitation is trivial. No evidence of tricuspid stenosis. Aortic Valve: The aortic valve is tricuspid. Aortic valve regurgitation is not visualized. Aortic valve sclerosis/calcification is present, without any evidence of aortic stenosis. Aortic valve mean gradient measures 5.0 mmHg. Aortic valve peak gradient measures 8.8 mmHg. Aortic valve area, by VTI measures 3.69 cm. Pulmonic Valve: The pulmonic valve was normal in  structure. Pulmonic valve regurgitation is not visualized. No evidence of pulmonic stenosis. Aorta: The aortic root is normal in size and structure. Venous: The inferior vena cava is normal in size with greater than 50% respiratory variability, suggesting right atrial pressure of 3 mmHg. IAS/Shunts: No atrial level shunt detected by color flow Doppler. Additional Comments: Moderate concentric LVH with proximal septal thickening.  LEFT VENTRICLE PLAX 2D LVIDd:         3.90 cm     Diastology LVIDs:         2.60 cm     LV e' medial:    4.54 cm/s LV PW:         1.30 cm     LV E/e' medial:  15.8 LV IVS:        1.90 cm     LV e' lateral:   4.54 cm/s LVOT diam:     2.50 cm     LV E/e' lateral: 15.8 LV SV:         106 LV SV Index:   46 LVOT Area:     4.91 cm  LV Volumes (MOD) LV vol d, MOD A2C: 79.4 ml LV vol d, MOD A4C: 59.1 ml LV vol s, MOD A2C: 28.8 ml LV vol s, MOD A4C: 22.1 ml LV SV MOD A2C:     50.6 ml LV SV MOD A4C:     59.1 ml LV SV MOD BP:      43.7 ml RIGHT VENTRICLE             IVC RV S prime:     12.60 cm/s  IVC diam: 2.20 cm TAPSE (M-mode): 2.0 cm LEFT ATRIUM             Index        RIGHT ATRIUM           Index LA diam:        4.00 cm 1.75 cm/m   RA Area:     13.10 cm LA Vol (A2C):   33.3 ml 14.60 ml/m  RA Volume:   27.20 ml  11.92 ml/m LA Vol (A4C):   55.1 ml 24.16 ml/m LA  Biplane Vol: 46.2 ml 20.25 ml/m  AORTIC VALVE AV Area (Vmax):    3.62 cm AV Area (Vmean):   3.41 cm AV Area (VTI):     3.69 cm AV Vmax:           148.00 cm/s AV Vmean:          106.000 cm/s AV VTI:            0.286 m AV Peak Grad:      8.8 mmHg AV Mean Grad:      5.0 mmHg LVOT Vmax:         109.00 cm/s LVOT Vmean:        73.700 cm/s LVOT VTI:          0.215 m LVOT/AV VTI ratio: 0.75  AORTA Ao Root diam: 3.50 cm Ao Asc diam:  3.50 cm MITRAL VALVE MV Area (PHT): 3.08 cm     SHUNTS MV Decel Time: 246 msec     Systemic VTI:  0.22 m MV E velocity: 71.80 cm/s   Systemic Diam: 2.50 cm MV A velocity: 102.00 cm/s MV E/A ratio:  0.70 Kirk Ruths MD Electronically signed by Kirk Ruths MD Signature Date/Time: 02/20/2021/11:36:38 AM    Final        LOS: 1 day   [facility] Pager on [email or web address]  02/21/2021, 11:00 AM

## 2021-02-21 NOTE — Progress Notes (Signed)
Inpatient Rehab Admissions Coordinator:   Met with patient and his family at the bedside to discuss CIR.  Explained 3 hrs/day of therapy, average length of stay 2 weeks (dependent on progress), with goals of supervision to independent.  Pt and family confirm plan to discharge to his brother's home after rehab until he's safe to be alone.  I will start insurance auth request today.   Shann Medal, PT, DPT Admissions Coordinator [phone] 02/21/21  1:16 PM

## 2021-02-21 NOTE — Plan of Care (Signed)
°  Problem: Education: Goal: Knowledge of General Education information will improve Description: Including pain rating scale, medication(s)/side effects and non-pharmacologic comfort measures Outcome: Progressing   Problem: Health Behavior/Discharge Planning: Goal: Ability to manage health-related needs will improve Outcome: Progressing   Problem: Clinical Measurements: Goal: Ability to maintain clinical measurements within normal limits will improve Outcome: Progressing Goal: Will remain free from infection Outcome: Progressing Goal: Diagnostic test results will improve Outcome: Progressing Goal: Respiratory complications will improve Outcome: Progressing Goal: Cardiovascular complication will be avoided Outcome: Progressing   Problem: Activity: Goal: Risk for activity intolerance will decrease Outcome: Progressing   Problem: Nutrition: Goal: Adequate nutrition will be maintained Outcome: Progressing   Problem: Coping: Goal: Level of anxiety will decrease Outcome: Progressing   Problem: Elimination: Goal: Will not experience complications related to bowel motility Outcome: Progressing Goal: Will not experience complications related to urinary retention Outcome: Progressing   Problem: Pain Managment: Goal: General experience of comfort will improve Outcome: Progressing   Problem: Safety: Goal: Ability to remain free from injury will improve Outcome: Progressing   Problem: Skin Integrity: Goal: Risk for impaired skin integrity will decrease Outcome: Progressing   Problem: Education: Goal: Knowledge of disease or condition will improve Outcome: Progressing Goal: Knowledge of secondary prevention will improve (SELECT ALL) Outcome: Progressing Goal: Knowledge of patient specific risk factors will improve (INDIVIDUALIZE FOR PATIENT) Outcome: Progressing Goal: Individualized Educational Video(s) Outcome: Progressing   Problem: Intracerebral Hemorrhage Tissue  Perfusion: Goal: Complications of Intracerebral Hemorrhage will be minimized Outcome: Progressing

## 2021-02-21 NOTE — Progress Notes (Signed)
Physical Therapy Treatment Patient Details Name: Joe Stewart MRN: [id number] DOB: Jun 11, 1955 Today's Date: 02/21/2021   History of Present Illness 66 y.o. male presented emergency department 1/18 with visual deficit and confusion for 1 week.  Patient reports that he developed a headache, on the left temporal region,  which is resolved.  He said he felt woozy and lightheaded.  He noted that his vision seemed off, with missing pieces in his visual field at least 2 to 3 days a He lives by himself. His brother reported that the patient seems confused with simple tasks today, such as closing the windows in the car, and also getting himself dressed.  PMHx:  DM, diabetic retinopathy, HTN    PT Comments    Pt was seen for mobility on hallway with lateral instability and esp a difficult time to stand on RLE to control dynamic balance.  This issue is more obvious with stepping task on Berg, where he cannot step up on L foot without direct assistance from PT to prevent R lateral fall.  Pt is relying on LLE more and cannot use RLE as support limb even for tandem standing.  Even so, he is more stable walking without AD, and does use R hand to touch wall rail for light tactile support.  Continue to plan for CIR stay since pt is going to have family support and can tolerate the length of therapy in a day.  He is so motivated, even asking at end of session to do more.  WE schedule will be requested for him.   Recommendations for follow up therapy are one component of a multi-disciplinary discharge planning process, led by the attending physician.  Recommendations may be updated based on patient status, additional functional criteria and insurance authorization.  Follow Up Recommendations  Acute inpatient rehab (3hours/day)     Assistance Recommended at Discharge Intermittent Supervision/Assistance  Patient can return home with the following A little help with walking and/or transfers;A little help with  bathing/dressing/bathroom;Assistance with cooking/housework;Assist for transportation;Help with stairs or ramp for entrance   Equipment Recommendations  None recommended by PT (await rehab stay)    Recommendations for Other Services Rehab consult     Precautions / Restrictions Precautions Precautions: Fall Precaution Comments: R side inattention, lateral instability Restrictions Weight Bearing Restrictions: No     Mobility  Bed Mobility Overal bed mobility: Needs Assistance Bed Mobility: Supine to Sit, Sit to Supine     Supine to sit: Supervision Sit to supine: Supervision   General bed mobility comments: supervision for safety and visual field issues    Transfers Overall transfer level: Needs assistance Equipment used: 1 person hand held assist Transfers: Sit to/from Stand Sit to Stand: Min guard           General transfer comment: standing with pt requiring a short time to capture static balance, no walker used    Ambulation/Gait Ambulation/Gait assistance: Min guard, Min assist Gait Distance (Feet): 140 Feet Assistive device: 1 person hand held assist Gait Pattern/deviations: Step-through pattern, Wide base of support, Decreased stride length Gait velocity: reduced, variable Gait velocity interpretation: <1.31 ft/sec, indicative of household ambulator Pre-gait activities: standing balance challenge to see direction of LOB General Gait Details: balance struggles with RLE are mainly with SLS tasks on RLE such as standing on RLE to step up and tap L foot   Stairs             Wheelchair Mobility    Modified Rankin (Stroke Patients Only)  Balance Overall balance assessment: Needs assistance Sitting-balance support: Feet supported Sitting balance-Leahy Scale: Good     Standing balance support: No upper extremity supported, During functional activity Standing balance-Leahy Scale: Fair Standing balance comment: reviewed Engineer, water Sit to Stand: Able to stand  independently using hands Standing Unsupported: Able to stand 2 minutes with supervision Sitting with Back Unsupported but Feet Supported on Floor or Stool: Able to sit safely and securely 2 minutes Stand to Sit: Controls descent by using hands Transfers: Able to transfer safely, definite need of hands Standing Unsupported with Eyes Closed: Able to stand 10 seconds with supervision Standing Ubsupported with Feet Together: Able to place feet together independently and stand for 1 minute with supervision From Standing, Reach Forward with Outstretched Arm: Can reach forward >12 cm safely (5") From Standing Position, Pick up Object from Floor: Able to pick up shoe, needs supervision From Standing Position, Turn to Look Behind Over each Shoulder: Looks behind one side only/other side shows less weight shift Turn 360 Degrees: Able to turn 360 degrees safely in 4 seconds or less Standing Unsupported, Alternately Place Feet on Step/Stool: Needs assistance to keep from falling or unable to try Standing Unsupported, One Foot in Front: Able to plae foot ahead of the other independently and hold 30 seconds Standing on One Leg: Able to lift leg independently and hold equal to or more than 3 seconds Total Score: 40        Cognition Arousal/Alertness: Awake/alert Behavior During Therapy: WFL for tasks assessed/performed Overall Cognitive Status: Within Functional Limits for tasks assessed                                 General Comments: pt is following motor cues well but has R side inattention with balance tasks        Exercises      General Comments General comments (skin integrity, edema, etc.): pt is more aware of R side but with balance skills is better on Berg today, up 2 points      Pertinent Vitals/Pain      Home Living                          Prior Function            PT Goals (current  goals can now be found in the care plan section) Acute Rehab PT Goals Patient Stated Goal: I need to be independent again Progress towards PT goals: Progressing toward goals    Frequency    Min 4X/week      PT Plan Frequency needs to be updated    Co-evaluation              AM-PAC PT "6 Clicks" Mobility   Outcome Measure  Help needed turning from your back to your side while in a flat bed without using bedrails?: A Little Help needed moving from lying on your back to sitting on the side of a flat bed without using bedrails?: A Little Help needed moving to and from a bed to a chair (including a wheelchair)?: A Little Help needed standing up from a chair using your arms (e.g., wheelchair or bedside chair)?: A Little Help needed to walk in hospital room?: A Lot Help needed climbing 3-5  steps with a railing? : Total 6 Click Score: 15    End of Session Equipment Utilized During Treatment: Gait belt Activity Tolerance: Patient limited by fatigue;Treatment limited secondary to medical complications (Comment) Patient left: in bed;with call bell/phone within reach;with bed alarm set;with family/visitor present Nurse Communication: Mobility status PT Visit Diagnosis: Unsteadiness on feet (R26.81);Muscle weakness (generalized) (M62.81);Ataxic gait (R26.0);Other symptoms and signs involving the nervous system (A98.614)     Time: 8307-3543 PT Time Calculation (min) (ACUTE ONLY): 32 min  Charges:  $Gait Training: 8-22 mins $Neuromuscular Re-education: 8-22 mins         Ramond Dial 02/21/2021, 5:08 PM  Mee Hives, PT PhD Acute Rehab Dept. Number: [place]

## 2021-02-21 NOTE — Progress Notes (Signed)
Inpatient Diabetes Program Recommendations  AACE/ADA: New Consensus Statement on Inpatient Glycemic Control (2015)  Target Ranges:  Prepandial:   less than 140 mg/dL      Peak postprandial:   less than 180 mg/dL (1-2 hours)      Critically ill patients:  140 - 180 mg/dL   Lab Results  Component Value Date   GLUCAP 214 (H) 02/21/2021   HGBA1C 12.3 (H) 02/20/2021    Review of Glycemic Control  Diabetes history: type 2 Outpatient Diabetes medications: Metformin 1000 mg BID (has not taken for over 6 months) Current orders for Inpatient glycemic control: Novolog RESISTANT correction scale TID & HS scale  Inpatient Diabetes Program Recommendations:   Spoke with patient about his diabetes. States that he was diagnosed about 2-3 years ago when he had a knee surgery. States that he was on Metformin at first, but quit taking it at least 6 months ago. Recently started checking his blood sugars and they were usually >300-400 mg/dl.   Discussed HgbA1C of 12.3% and that his average blood sugar over the last 2-3 months was 300 mg/dl. Discussed normal blood sugar. States that he does have a home blood glucose meter and strips. He is ready to make changes; this was a "wake up call". Discussed chances of him using insulin; he is hesitant with needles. Showed him the insulin pen and the needle and he was surprised that the needle was small. Decisions for him to go home on insulin will be determined by the physician. Waiting on approval for rehab unit.   Will continue to monitor blood sugars while in the hospital. Will follow to see if patient needs more insulin teaching prior to discharge.   Harvel Ricks RN BSN CDE Diabetes Coordinator Pager: [phone]  8am-5pm

## 2021-02-21 NOTE — Progress Notes (Signed)
Speech Language Pathology Treatment: Cognitive-Linquistic  Patient Details Name: Joe Stewart MRN: [id number] DOB: 06/03/1955 Today's Date: 02/21/2021 Time: 3810-1751 SLP Time Calculation (min) (ACUTE ONLY): 23 min  Assessment / Plan / Recommendation Clinical Impression  Pt was seen for treatment. He was alert and cooperative during the session, and he reported that he has had plenty of opportunity to work on word retrieval today due to visits from family. Pt was educated regarding strategies to improve word retrieval; he verbalized understanding and used one during structured tasks with min-mod cues. He demonstrated 57% accuracy with a medication management (prescription) task increasing to 71% with self-correction and to 100% with verbal prompts. Cues were intermittently needed for reading comprehension during this task. He achieved 66% accuracy time management problems increasing to 100% with cues. Pt completed a mental manipulation sequencing task with 40% accuracy increasing to 80% with self-correction, and to 100% with verbal prompts. SLP will continue to follow pt.    HPI HPI: Pt is a 66 y.o. male who presented with 2-day history of left temporal headache along with right-sided blurred vision and word retrieval difficulty. 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 PMH: diabetes, hypertension, hyperlipidemia, obesity.      SLP Plan  Continue with current plan of care      Recommendations for follow up therapy are one component of a multi-disciplinary discharge planning process, led by the attending physician.  Recommendations may be updated based on patient status, additional functional criteria and insurance authorization.     Recommendations                   Follow Up Recommendations: Acute inpatient rehab (3hours/day) Assistance recommended at discharge: Intermittent Supervision/Assistance SLP Visit Diagnosis: Aphasia (R47.01);Cognitive communication deficit (R41.841) Plan: Continue with current plan of care         Joe Stewart I. Hardin Negus, [place], [place] Office number [phone] Pager [place]  02/21/2021, 5:27 PM

## 2021-02-22 LAB — GLUCOSE, CAPILLARY
Glucose-Capillary: 173 mg/dL — ABNORMAL HIGH (ref 70–99)
Glucose-Capillary: 256 mg/dL — ABNORMAL HIGH (ref 70–99)
Glucose-Capillary: 108 mg/dL — ABNORMAL HIGH (ref 70–99)
Glucose-Capillary: 187 mg/dL — ABNORMAL HIGH (ref 70–99)

## 2021-02-22 NOTE — Progress Notes (Signed)
[facility] PROGRESS NOTE   Joe Stewart [id number]:[id number] DOB: 05-17-1955 DOA: 02/19/2021  2 DOS: the patient was seen and examined on 02/22/2021  PCP: [name], NP  Brief History and Hospital Course:  66 y.o. male with a past medical history of hypertension and diabetes who was in his usual state of health about 2 days prior to admission when he developed a headache.  Also reported visual disturbances.  Brother also reported the patient has been more confused than usual.  There was also some episode where he was finding it difficult to find words to speak.  Patient was brought into the emergency department.  Imaging studies showed acute stroke.  Patient was hospitalized for further management.  Patient was seen by stroke service.  Subsequently seen by physical therapy who recommended inpatient rehabilitation.  Waiting on CIR placement.  Consultants: Neurology  Procedures: Transthoracic echocardiogram    Subjective: Patient denies any complaints at this time.  Feels well.   Assessment/Plan:  * Acute CVA (cerebrovascular accident) ([place])- (present on admission) CT head raises concern for left temporal lobe infarct.  No hemorrhage was noted.    MRI brain showed acute infarct in the posterior mesial temporal lobe.  Additional infarcts were noted all in the left PCA territory.  Other vascular abnormalities were also noted. LDL 124.  HbA1c 12.3. Echocardiogram shows moderate LVH with a EF of 70 to 75%.  Grade 1 diastolic dysfunction noted. Patient seen by the stroke service.  Plan is to place him on aspirin and Plavix for 3 weeks followed by aspirin alone. Seen by PT and OT.  Did not do well with physical therapy with unsteady gait and loss of balance.  They are recommending inpatient rehabilitation.  Inpatient rehab has been consulted. Patient also exhibiting some signs of cognitive impairment.  This will need further work-up in the outpatient setting.  T51 folic acid levels were  normal.  TSH was normal.  RPR is pending.   Waiting on insurance authorization before he can go to inpatient rehabilitation. Needs 30-day heart monitor after discharge.  Essential hypertension- (present on admission) Patient has been noncompliant with his antihypertensives.  Currently permissive hypertension is being allowed. He has not been taking any of his antihypertensives including amlodipine hydrochlorothiazide losartan and metoprolol.   Based on previous records he is followed by [facility].  He has known right bundle branch block.  Had a cardiac cath in 2021 which showed diffuse disease but was being managed medically.  Did not require any PCI.  Does not take aspirin on a daily basis per patient. Continue to monitor.  Should be able to resume losartan in the next 24 hours.  Type 2 diabetes mellitus without complication, without long-term current use of insulin (HCC) Poorly controlled suggested by HbA1c of 12.3.  Has not been taking any of his oral medications.  Just on SSI here in the hospital.  Plan on resuming metformin in the next 24 hours.    Hyperlipidemia- (present on admission) LDL noted to be 124.  Started back on statin.    Obesity Estimated body mass index is 39.38 kg/m as calculated from the following:   Height as of 01/29/20: 5\' 8"  (1.727 m).   Weight as of 01/29/20: 117.5 kg.    DVT Prophylaxis: Lovenox Code Status: Full code Family Communication: Discussed with patient.  No family at bedside. Disposition Plan: To be determined  Status is: Inpatient  Remains inpatient appropriate because: Acute stroke.  Imbalance.  Medications: Scheduled:   stroke: mapping our early stages of recovery book   Does not apply Once   aspirin  325 mg Oral Daily   atorvastatin  80 mg Oral Daily   clopidogrel  75 mg Oral Daily   enoxaparin (LOVENOX) injection  40 mg Subcutaneous Q24H   insulin aspart  0-20 Units Subcutaneous TID WC   insulin aspart  0-5  Units Subcutaneous QHS   Continuous: [id number] **OR** acetaminophen (TYLENOL) oral liquid 160 mg/5 mL **OR** acetaminophen  Antibiotics: Anti-infectives (From admission, onward)    None       Objective:  Vital Signs  Vitals:   02/21/21 2020 02/21/21 2316 02/22/21 0352 02/22/21 0731  BP: 132/81 (!) 138/92 (!) 157/87 (!) 161/99  Pulse: 77 79 76 79  Resp: 16 17 15 16   Temp: 98.5 F (36.9 C) 98 F (36.7 C) 98.1 F (36.7 C) 98.6 F (37 C)  TempSrc: Oral Oral Oral Oral  SpO2: 97% 99% 97% 98%    Intake/Output Summary (Last 24 hours) at 02/22/2021 1217 Last data filed at 02/22/2021 3875 Gross per 24 hour  Intake 240 ml  Output --  Net 240 ml    There were no vitals filed for this visit.   General appearance: Awake alert.  In no distress Resp: Clear to auscultation bilaterally.  Normal effort Cardio: S1-S2 is normal regular.  No S3-S4.  No rubs murmurs or bruit GI: Abdomen is soft.  Nontender nondistended.  Bowel sounds are present normal.  No masses organomegaly Extremities: No edema.  Full range of motion of lower extremities. Neurologic:   No focal neurological deficits.      Lab Results:  Data Reviewed: I have personally reviewed labs and imaging study reports  CBC: Recent Labs  Lab 02/19/21 1345 02/19/21 1403 02/20/21 0455  WBC 7.4  --  7.5  NEUTROABS 4.2  --   --   HGB 14.6 14.6 14.0  HCT 42.5 43.0 40.1  MCV 90.6  --  89.9  PLT 219  --  201     Basic Metabolic Panel: Recent Labs  Lab 02/19/21 1345 02/19/21 1403 02/20/21 0455  NA 135 135 137  K 4.4 4.4 3.9  CL 99 100 100  CO2 25  --  28  GLUCOSE 304* 307* 162*  BUN 18 21 15   CREATININE 1.06 1.00 1.01  CALCIUM 9.4  --  8.9     GFR: CrCl cannot be calculated (Unknown ideal weight.).  Liver Function Tests: Recent Labs  Lab 02/19/21 1345 02/20/21 0455  AST 36 33  ALT 32 29  ALKPHOS 69 64  BILITOT 0.7 0.6  PROT 7.7 7.0  ALBUMIN 3.8 3.4*      Coagulation  Profile: Recent Labs  Lab 02/19/21 1345  INR 1.1      HbA1C: Recent Labs    02/20/21 0455  HGBA1C 12.3*     CBG: Recent Labs  Lab 02/21/21 1206 02/21/21 1615 02/21/21 2128 02/22/21 0633 02/22/21 1211  GLUCAP 214* 127* 185* 173* 256*     Lipid Profile: Recent Labs    02/20/21 0455  CHOL 234*  HDL 27*  LDLCALC UNABLE TO CALCULATE IF TRIGLYCERIDE OVER 400 mg/dL  TRIG 410*  CHOLHDL 8.7  LDLDIRECT 124.9*     Thyroid Function Tests: Recent Labs    02/20/21 0455  TSH 3.683     Anemia Panel: Recent Labs    02/21/21 0336  VITAMINB12 416  FOLATE 18.1     Recent Results (from the  past 240 hour(s))  Resp Panel by RT-PCR (Flu A&B, Covid) Nasopharyngeal Swab     Status: None   Collection Time: 02/19/21  1:39 PM   Specimen: Nasopharyngeal Swab; Nasopharyngeal(NP) swabs in vial transport medium  Result Value Ref Range Status   SARS Coronavirus 2 by RT PCR NEGATIVE NEGATIVE Final    Comment: (NOTE) SARS-CoV-2 target nucleic acids are NOT DETECTED.  The SARS-CoV-2 RNA is generally detectable in upper respiratory specimens during the acute phase of infection. The lowest concentration of SARS-CoV-2 viral copies this assay can detect is 138 copies/mL. A negative result does not preclude SARS-Cov-2 infection and should not be used as the sole basis for treatment or other patient management decisions. A negative result may occur with  improper specimen collection/handling, submission of specimen other than nasopharyngeal swab, presence of viral mutation(s) within the areas targeted by this assay, and inadequate number of viral copies(<138 copies/mL). A negative result must be combined with clinical observations, patient history, and epidemiological information. The expected result is Negative.  Fact Sheet for Patients:  EntrepreneurPulse.com.au  Fact Sheet for Healthcare Providers:  IncredibleEmployment.be  This test  is no t yet approved or cleared by the [place] FDA and  has been authorized for detection and/or diagnosis of SARS-CoV-2 by FDA under an Emergency Use Authorization (EUA). This EUA will remain  in effect (meaning this test can be used) for the duration of the COVID-19 declaration under Section 564(b)(1) of the Act, 21 U.S.C.section 360bbb-3(b)(1), unless the authorization is terminated  or revoked sooner.       Influenza A by PCR NEGATIVE NEGATIVE Final   Influenza B by PCR NEGATIVE NEGATIVE Final    Comment: (NOTE) The Xpert Xpress SARS-CoV-2/FLU/RSV plus assay is intended as an aid in the diagnosis of influenza from Nasopharyngeal swab specimens and should not be used as a sole basis for treatment. Nasal washings and aspirates are unacceptable for Xpert Xpress SARS-CoV-2/FLU/RSV testing.  Fact Sheet for Patients: EntrepreneurPulse.com.au  Fact Sheet for Healthcare Providers: IncredibleEmployment.be  This test is not yet approved or cleared by the [place] FDA and has been authorized for detection and/or diagnosis of SARS-CoV-2 by FDA under an Emergency Use Authorization (EUA). This EUA will remain in effect (meaning this test can be used) for the duration of the COVID-19 declaration under Section 564(b)(1) of the Act, 21 U.S.C. section 360bbb-3(b)(1), unless the authorization is terminated or revoked.  Performed at [facility], [place] [address], [place], [place] [address]        Radiology Studies: No results found.     LOS: 2 days   [facility] Pager on [email or web address]  02/22/2021, 12:17 PM

## 2021-02-23 LAB — GLUCOSE, CAPILLARY
Glucose-Capillary: 179 mg/dL — ABNORMAL HIGH (ref 70–99)
Glucose-Capillary: 263 mg/dL — ABNORMAL HIGH (ref 70–99)
Glucose-Capillary: 192 mg/dL — ABNORMAL HIGH (ref 70–99)
Glucose-Capillary: 194 mg/dL — ABNORMAL HIGH (ref 70–99)

## 2021-02-23 LAB — BASIC METABOLIC PANEL
Anion gap: 10 (ref 5–15)
BUN: 21 mg/dL (ref 8–23)
CO2: 26 mmol/L (ref 22–32)
Calcium: 9 mg/dL (ref 8.9–10.3)
Chloride: 103 mmol/L (ref 98–111)
Creatinine, Ser: 1.25 mg/dL — ABNORMAL HIGH (ref 0.61–1.24)
GFR, Estimated: >60 mL/min (ref >60)
Glucose, Bld: 149 mg/dL — ABNORMAL HIGH (ref 70–99)
Potassium: 3.9 mmol/L (ref 3.5–5.1)
Sodium: 139 mmol/L (ref 135–145)

## 2021-02-23 LAB — CBC
HCT: 40 % (ref 39.0–52.0)
Hemoglobin: 13.5 g/dL (ref 13.0–17.0)
MCH: 30.8 pg (ref 26.0–34.0)
MCHC: 33.8 g/dL (ref 30.0–36.0)
MCV: 91.3 fL (ref 80.0–100.0)
Platelets: 206 10*3/uL (ref 150–400)
RBC: 4.38 MIL/uL (ref 4.22–5.81)
RDW: 12.7 % (ref 11.5–15.5)
WBC: 7.5 10*3/uL (ref 4.0–10.5)
nRBC: 0 % (ref 0.0–0.2)

## 2021-02-23 NOTE — Plan of Care (Signed)
°  Problem: Education: Goal: Knowledge of General Education information will improve Description: Including pain rating scale, medication(s)/side effects and non-pharmacologic comfort measures Outcome: Progressing   Problem: Health Behavior/Discharge Planning: Goal: Ability to manage health-related needs will improve Outcome: Progressing   Problem: Clinical Measurements: Goal: Ability to maintain clinical measurements within normal limits will improve Outcome: Progressing Goal: Will remain free from infection Outcome: Progressing Goal: Diagnostic test results will improve Outcome: Progressing Goal: Respiratory complications will improve Outcome: Progressing Goal: Cardiovascular complication will be avoided Outcome: Progressing   Problem: Activity: Goal: Risk for activity intolerance will decrease Outcome: Progressing   Problem: Nutrition: Goal: Adequate nutrition will be maintained Outcome: Progressing   Problem: Coping: Goal: Level of anxiety will decrease Outcome: Progressing   Problem: Elimination: Goal: Will not experience complications related to bowel motility Outcome: Progressing Goal: Will not experience complications related to urinary retention Outcome: Progressing   Problem: Pain Managment: Goal: General experience of comfort will improve Outcome: Progressing   Problem: Safety: Goal: Ability to remain free from injury will improve Outcome: Progressing   Problem: Skin Integrity: Goal: Risk for impaired skin integrity will decrease Outcome: Progressing   Problem: Education: Goal: Knowledge of disease or condition will improve Outcome: Progressing Goal: Knowledge of secondary prevention will improve (SELECT ALL) Outcome: Progressing Goal: Knowledge of patient specific risk factors will improve (INDIVIDUALIZE FOR PATIENT) Outcome: Progressing Goal: Individualized Educational Video(s) Outcome: Progressing   Problem: Intracerebral Hemorrhage Tissue  Perfusion: Goal: Complications of Intracerebral Hemorrhage will be minimized Outcome: Progressing   Problem: Education: Goal: Knowledge of disease or condition will improve Outcome: Progressing Goal: Knowledge of secondary prevention will improve (SELECT ALL) Outcome: Progressing Goal: Knowledge of patient specific risk factors will improve (INDIVIDUALIZE FOR PATIENT) Outcome: Progressing

## 2021-02-23 NOTE — Progress Notes (Signed)
[facility] PROGRESS NOTE   Joe Stewart EPP:[id number] DOB: [birth year] DOA: 02/19/2021  3 DOS: the patient was seen and examined on 02/23/2021  PCP: [name], NP  Brief History and Hospital Course:  66 y.o. male with a past medical history of hypertension and diabetes who was in his usual state of health about 2 days prior to admission when he developed a headache.  Also reported visual disturbances.  Brother also reported the patient has been more confused than usual.  There was also some episode where he was finding it difficult to find words to speak.  Patient was brought into the emergency department.  Imaging studies showed acute stroke.  Patient was hospitalized for further management.  Patient was seen by stroke service.  Subsequently seen by physical therapy who recommended inpatient rehabilitation.  Waiting on CIR placement.  Consultants: Neurology  Procedures: Transthoracic echocardiogram    Subjective: No complaints offered.  Feels well.   Assessment/Plan:  * Acute CVA (cerebrovascular accident) ([place])- (present on admission) CT head raises concern for left temporal lobe infarct.  No hemorrhage was noted.    MRI brain showed acute infarct in the posterior mesial temporal lobe.  Additional infarcts were noted all in the left PCA territory.  Other vascular abnormalities were also noted. LDL 124.  HbA1c 12.3. Echocardiogram shows moderate LVH with a EF of 70 to 75%.  Grade 1 diastolic dysfunction noted. Patient seen by the stroke service.  Plan is to place him on aspirin and Plavix for 3 weeks followed by aspirin alone. Seen by PT and OT.  Did not do well with physical therapy with unsteady gait and loss of balance.  They are recommending inpatient rehabilitation.  Inpatient rehab has been consulted. Patient also exhibiting some signs of cognitive impairment.  This will need further work-up in the outpatient setting.  S06 folic acid levels were normal.  TSH was  normal.  RPR is pending.   Waiting on insurance authorization before he can go to inpatient rehabilitation. Needs 30-day heart monitor after discharge.  Essential hypertension- (present on admission) Patient has been noncompliant with his antihypertensives.  Currently permissive hypertension is being allowed. He has not been taking any of his antihypertensives including amlodipine hydrochlorothiazide losartan and metoprolol.   Based on previous records he is followed by [facility].  He has known right bundle branch block.  Had a cardiac cath in 2021 which showed diffuse disease but was being managed medically.  Did not require any PCI.  Does not take aspirin on a daily basis per patient. Blood pressure noted to be stable.  He is currently not on any antihypertensives.  Since blood pressure is reasonably well controlled we will continue to monitor off of antihypertensives for now.  Type 2 diabetes mellitus without complication, without long-term current use of insulin (HCC) Poorly controlled suggested by HbA1c of 12.3.  Has not been taking any of his oral medications.  Just on SSI here in the hospital.  Consideration given to reinitiating metformin.  Creatinine noted to be 1.25 today.  Recheck tomorrow.  Hyperlipidemia- (present on admission) LDL noted to be 124.  Started back on statin.    Obesity Estimated body mass index is 39.38 kg/m as calculated from the following:   Height as of 01/29/20: 5\' 8"  (1.727 m).   Weight as of 01/29/20: 117.5 kg.    DVT Prophylaxis: Lovenox Code Status: Full code Family Communication: Discussed with patient.  No family at bedside. Disposition Plan: Waiting on insurance  authorization for inpatient rehabilitation placement  Status is: Inpatient  Remains inpatient appropriate because: Acute stroke.  Imbalance.        Medications: Scheduled:   stroke: mapping our early stages of recovery book   Does not apply Once   aspirin  325 mg  Oral Daily   atorvastatin  80 mg Oral Daily   clopidogrel  75 mg Oral Daily   enoxaparin (LOVENOX) injection  40 mg Subcutaneous Q24H   insulin aspart  0-20 Units Subcutaneous TID WC   insulin aspart  0-5 Units Subcutaneous QHS   Continuous: [id number] **OR** acetaminophen (TYLENOL) oral liquid 160 mg/5 mL **OR** acetaminophen  Antibiotics: Anti-infectives (From admission, onward)    None       Objective:  Vital Signs  Vitals:   02/22/21 1926 02/22/21 2259 02/23/21 0400 02/23/21 0720  BP: (!) 160/87 135/78 129/65 140/85  Pulse: 79 75 70 83  Resp: 16 16 16 16   Temp: 98.4 F (36.9 C) 98.4 F (36.9 C) 97.6 F (36.4 C) 97.8 F (36.6 C)  TempSrc: Oral Oral Oral Oral  SpO2: 97% 99% 97% 97%   No intake or output data in the 24 hours ending 02/23/21 1118  There were no vitals filed for this visit.   General appearance: Awake alert.  In no distress Resp: Clear to auscultation bilaterally.  Normal effort Cardio: S1-S2 is normal regular.  No S3-S4.  No rubs murmurs or bruit GI: Abdomen is soft.  Nontender nondistended.  Bowel sounds are present normal.  No masses organomegaly Extremities: No edema.  Full range of motion of lower extremities. Neurologic:  No focal neurological deficits.       Lab Results:  Data Reviewed: I have personally reviewed labs and imaging study reports  CBC: Recent Labs  Lab 02/19/21 1345 02/19/21 1403 02/20/21 0455 02/23/21 0207  WBC 7.4  --  7.5 7.5  NEUTROABS 4.2  --   --   --   HGB 14.6 14.6 14.0 13.5  HCT 42.5 43.0 40.1 40.0  MCV 90.6  --  89.9 91.3  PLT 219  --  201 206     Basic Metabolic Panel: Recent Labs  Lab 02/19/21 1345 02/19/21 1403 02/20/21 0455 02/23/21 0207  NA 135 135 137 139  K 4.4 4.4 3.9 3.9  CL 99 100 100 103  CO2 25  --  28 26  GLUCOSE 304* 307* 162* 149*  BUN 18 21 15 21   CREATININE 1.06 1.00 1.01 1.25*  CALCIUM 9.4  --  8.9 9.0     GFR: CrCl cannot be calculated (Unknown ideal  weight.).  Liver Function Tests: Recent Labs  Lab 02/19/21 1345 02/20/21 0455  AST 36 33  ALT 32 29  ALKPHOS 69 64  BILITOT 0.7 0.6  PROT 7.7 7.0  ALBUMIN 3.8 3.4*      Coagulation Profile: Recent Labs  Lab 02/19/21 1345  INR 1.1       CBG: Recent Labs  Lab 02/22/21 0633 02/22/21 1211 02/22/21 1636 02/22/21 2111 02/23/21 0609  GLUCAP 173* 256* 108* 187* 179*       Anemia Panel: Recent Labs    02/21/21 0336  VITAMINB12 416  FOLATE 18.1     Recent Results (from the past 240 hour(s))  Resp Panel by RT-PCR (Flu A&B, Covid) Nasopharyngeal Swab     Status: None   Collection Time: 02/19/21  1:39 PM   Specimen: Nasopharyngeal Swab; Nasopharyngeal(NP) swabs in vial transport medium  Result Value Ref Range Status  SARS Coronavirus 2 by RT PCR NEGATIVE NEGATIVE Final    Comment: (NOTE) SARS-CoV-2 target nucleic acids are NOT DETECTED.  The SARS-CoV-2 RNA is generally detectable in upper respiratory specimens during the acute phase of infection. The lowest concentration of SARS-CoV-2 viral copies this assay can detect is 138 copies/mL. A negative result does not preclude SARS-Cov-2 infection and should not be used as the sole basis for treatment or other patient management decisions. A negative result may occur with  improper specimen collection/handling, submission of specimen other than nasopharyngeal swab, presence of viral mutation(s) within the areas targeted by this assay, and inadequate number of viral copies(<138 copies/mL). A negative result must be combined with clinical observations, patient history, and epidemiological information. The expected result is Negative.  Fact Sheet for Patients:  EntrepreneurPulse.com.au  Fact Sheet for Healthcare Providers:  IncredibleEmployment.be  This test is no t yet approved or cleared by the [place] FDA and  has been authorized for detection and/or diagnosis of  SARS-CoV-2 by FDA under an Emergency Use Authorization (EUA). This EUA will remain  in effect (meaning this test can be used) for the duration of the COVID-19 declaration under Section 564(b)(1) of the Act, 21 U.S.C.section 360bbb-3(b)(1), unless the authorization is terminated  or revoked sooner.       Influenza A by PCR NEGATIVE NEGATIVE Final   Influenza B by PCR NEGATIVE NEGATIVE Final    Comment: (NOTE) The Xpert Xpress SARS-CoV-2/FLU/RSV plus assay is intended as an aid in the diagnosis of influenza from Nasopharyngeal swab specimens and should not be used as a sole basis for treatment. Nasal washings and aspirates are unacceptable for Xpert Xpress SARS-CoV-2/FLU/RSV testing.  Fact Sheet for Patients: EntrepreneurPulse.com.au  Fact Sheet for Healthcare Providers: IncredibleEmployment.be  This test is not yet approved or cleared by the [place] FDA and has been authorized for detection and/or diagnosis of SARS-CoV-2 by FDA under an Emergency Use Authorization (EUA). This EUA will remain in effect (meaning this test can be used) for the duration of the COVID-19 declaration under Section 564(b)(1) of the Act, 21 U.S.C. section 360bbb-3(b)(1), unless the authorization is terminated or revoked.  Performed at [facility], [place] [address] [place], [place] [address]        Radiology Studies: No results found.     LOS: 3 days   Nadra Hritz [employer] on [email or web address]  02/23/2021, 11:18 AM

## 2021-02-24 DIAGNOSIS — N179 Acute kidney failure, unspecified: Secondary | ICD-10-CM | POA: Diagnosis not present

## 2021-02-24 LAB — BASIC METABOLIC PANEL
Anion gap: 8 (ref 5–15)
BUN: 27 mg/dL — ABNORMAL HIGH (ref 8–23)
CO2: 24 mmol/L (ref 22–32)
Calcium: 8.8 mg/dL — ABNORMAL LOW (ref 8.9–10.3)
Chloride: 105 mmol/L (ref 98–111)
Creatinine, Ser: 1.37 mg/dL — ABNORMAL HIGH (ref 0.61–1.24)
GFR, Estimated: 57 mL/min — ABNORMAL LOW (ref >60)
Glucose, Bld: 182 mg/dL — ABNORMAL HIGH (ref 70–99)
Potassium: 4 mmol/L (ref 3.5–5.1)
Sodium: 137 mmol/L (ref 135–145)

## 2021-02-24 LAB — GLUCOSE, CAPILLARY
Glucose-Capillary: 172 mg/dL — ABNORMAL HIGH (ref 70–99)
Glucose-Capillary: 211 mg/dL — ABNORMAL HIGH (ref 70–99)
Glucose-Capillary: 207 mg/dL — ABNORMAL HIGH (ref 70–99)
Glucose-Capillary: 148 mg/dL — ABNORMAL HIGH (ref 70–99)
Glucose-Capillary: 193 mg/dL — ABNORMAL HIGH (ref 70–99)

## 2021-02-24 MED ORDER — AMLODIPINE BESYLATE 5 MG PO TABS
5.0000 mg | ORAL_TABLET | Freq: Every day | ORAL | Status: DC
  Administered 2021-02-24 – 2021-02-25 (×2): 5 mg via ORAL
  Filled 2021-02-24 (×2): qty 1

## 2021-02-24 MED ORDER — SODIUM CHLORIDE 0.45 % IV SOLN: INTRAVENOUS | Status: AC

## 2021-02-24 NOTE — Care Management Important Message (Signed)
Important Message  Patient Details  Name: Joe Stewart MRN: [id number] Date of Birth: Oct 12, 1955   Medicare Important Message Given:  Yes     Orbie Pyo 02/24/2021, 2:49 PM

## 2021-02-24 NOTE — Plan of Care (Signed)
°  Problem: Education: Goal: Knowledge of General Education information will improve Description: Including pain rating scale, medication(s)/side effects and non-pharmacologic comfort measures Outcome: Progressing   Problem: Health Behavior/Discharge Planning: Goal: Ability to manage health-related needs will improve Outcome: Progressing   Problem: Clinical Measurements: Goal: Ability to maintain clinical measurements within normal limits will improve Outcome: Progressing Goal: Will remain free from infection Outcome: Progressing Goal: Diagnostic test results will improve Outcome: Progressing Goal: Respiratory complications will improve Outcome: Progressing Goal: Cardiovascular complication will be avoided Outcome: Progressing   Problem: Activity: Goal: Risk for activity intolerance will decrease Outcome: Progressing   Problem: Nutrition: Goal: Adequate nutrition will be maintained Outcome: Progressing   Problem: Coping: Goal: Level of anxiety will decrease Outcome: Progressing   Problem: Elimination: Goal: Will not experience complications related to bowel motility Outcome: Progressing Goal: Will not experience complications related to urinary retention Outcome: Progressing   Problem: Pain Managment: Goal: General experience of comfort will improve Outcome: Progressing   Problem: Safety: Goal: Ability to remain free from injury will improve Outcome: Progressing   Problem: Skin Integrity: Goal: Risk for impaired skin integrity will decrease Outcome: Progressing   Problem: Education: Goal: Knowledge of disease or condition will improve Outcome: Progressing Goal: Knowledge of secondary prevention will improve (SELECT ALL) Outcome: Progressing Goal: Knowledge of patient specific risk factors will improve (INDIVIDUALIZE FOR PATIENT) Outcome: Progressing Goal: Individualized Educational Video(s) Outcome: Progressing   Problem: Intracerebral Hemorrhage Tissue  Perfusion: Goal: Complications of Intracerebral Hemorrhage will be minimized Outcome: Progressing   Problem: Education: Goal: Knowledge of disease or condition will improve Outcome: Progressing Goal: Knowledge of secondary prevention will improve (SELECT ALL) Outcome: Progressing Goal: Knowledge of patient specific risk factors will improve (INDIVIDUALIZE FOR PATIENT) Outcome: Progressing

## 2021-02-24 NOTE — Progress Notes (Signed)
Inpatient Rehab Admissions Coordinator:   Darrol Angel for an update on prior auth request submitted Friday at 13:41.  Case is still pending, has not been assigned to a reviewer yet.  Representative hung up on me when I inquired as to what the delay was.   Shann Medal, PT, DPT Admissions Coordinator [phone] 02/24/21  12:33 PM

## 2021-02-24 NOTE — Progress Notes (Signed)
Inpatient Diabetes Program Recommendations  AACE/ADA: New Consensus Statement on Inpatient Glycemic Control (2015)  Target Ranges:  Prepandial:   less than 140 mg/dL      Peak postprandial:   less than 180 mg/dL (1-2 hours)      Critically ill patients:  140 - 180 mg/dL    Latest Reference Range & Units 02/23/21 06:09 02/23/21 11:52 02/23/21 16:34 02/23/21 21:21  Glucose-Capillary 70 - 99 mg/dL 179 (H) 263 (H) 192 (H) 194 (H)  (H): Data is abnormally high  Latest Reference Range & Units 02/24/21 08:04 02/24/21 11:29  Glucose-Capillary 70 - 99 mg/dL 211 (H) 207 (H)  (H): Data is abnormally high    Home DM Meds: Metformin 1000 mg BID (has not taken for over 6 months)  Current Orders: Novolog Resistant Correction Scale/ SSI (0-20 units) TID AC + HS      MD- Note CBGs >200 today and had some elevations the day prior.  Please consider adding basal insulin to hospital regimen:  Semglee 10 units Daily (~0.1 units/kg)    --Will follow patient during hospitalization--  Wyn Quaker RN, MSN, CDE Diabetes Coordinator Inpatient Glycemic Control Team Team Pager: [phone] (8a-5p)

## 2021-02-24 NOTE — Progress Notes (Signed)
Patient has no complaints during the shift and is resting comfortably.

## 2021-02-24 NOTE — Progress Notes (Signed)
Physical Therapy Treatment Patient Details Name: Joe Stewart MRN: [id number] DOB: August 05, 1955 Today's Date: 02/24/2021   History of Present Illness 66 y.o. male presented emergency department 1/18 with visual deficit and confusion for 1 week.  Patient reports that he developed a headache, on the left temporal region,  which is resolved.  He said he felt woozy and lightheaded.  He noted that his vision seemed off, with missing pieces in his visual field at least 2 to 3 days a He lives by himself. His brother reported that the patient seems confused with simple tasks today, such as closing the windows in the car, and also getting himself dressed.  PMHx:  DM, diabetic retinopathy, HTN    PT Comments    Pt progressing well towards all goals. Pt with significantly improved R LE strength, stability with ambulation and is able to complete stairs with assist. Pt remains at increased falls risk as noted on DGI score of 16/24. Pt reports 24/7 assist at home as he plans on going home with his brother. Pt to benefit from a short stay at [facility] upon d/c to achieve safe supervision level of function for safe transition home with family. Acute PT to cont to follow.    Recommendations for follow up therapy are one component of a multi-disciplinary discharge planning process, led by the attending physician.  Recommendations may be updated based on patient status, additional functional criteria and insurance authorization.  Follow Up Recommendations  Acute inpatient rehab (3hours/day)     Assistance Recommended at Discharge Intermittent Supervision/Assistance  Patient can return home with the following A little help with walking and/or transfers;A little help with bathing/dressing/bathroom;Assistance with cooking/housework;Direct supervision/assist for medications management;Direct supervision/assist for financial management;Assist for transportation;Help with stairs or ramp for entrance   Equipment Recommendations   Rolling walker (2 wheels) (pt states he has one)    Recommendations for Other Services Rehab consult     Precautions / Restrictions Precautions Precautions: Fall Precaution Comments: R lateral inattention Restrictions Weight Bearing Restrictions: No     Mobility  Bed Mobility               General bed mobility comments: pt received coming out of the bathroom    Transfers Overall transfer level: Needs assistance Equipment used: Rolling walker (2 wheels) Transfers: Sit to/from Stand Sit to Stand: Min guard           General transfer comment: verbal cues to not pull up on RW, min guard for safety and to steady, wide base of support    Ambulation/Gait Ambulation/Gait assistance: Min guard, Min assist Gait Distance (Feet): 150 Feet (x1 with RW, 100x1 without AD) Assistive device: Rolling walker (2 wheels), None Gait Pattern/deviations: Step-through pattern, Decreased stride length Gait velocity: faster with RW, slower without AD Gait velocity interpretation: <1.31 ft/sec, indicative of household ambulator   General Gait Details: pt vearing to R with RW, verbal cues to stay in walker however pt with reciprocal, equal step length and height, not R LE instability, improved cadene compared to previous note and no LOB with use of RW. Pt then amb without AD or pushing IV pole. PT provided stability via gait belt at posterior hips. Pt with noted instability, step gait pattern and wider base of support   Stairs Stairs: Yes   Stair Management: One rail Right, Two rails, Alternating pattern, Forwards Number of Stairs: 2 (x3 sets) General stair comments: pt reports having 3 steps to enter Brother's home without handrails. Pt able to  lead with R foot and complete reciprocal gait pattern with both bilat HRs and unilateral hand rail. When attempting without HR pt unsteady and required minA to prevent fall. PT provided R HH support, pt able to complete step to pattern up/down  steps   Wheelchair Mobility    Modified Rankin (Stroke Patients Only) Modified Rankin (Stroke Patients Only) Pre-Morbid Rankin Score: No symptoms Modified Rankin: Moderate disability     Balance Overall balance assessment: Needs assistance Sitting-balance support: Feet supported Sitting balance-Leahy Scale: Good     Standing balance support: No upper extremity supported, During functional activity Standing balance-Leahy Scale: Fair Standing balance comment: able to wash hands at sink without leaning on sink, noted wide base of support                 Standardized Balance Assessment Standardized Balance Assessment : Dynamic Gait Index   Dynamic Gait Index Level Surface: Mild Impairment Change in Gait Speed: Mild Impairment Gait with Horizontal Head Turns: Mild Impairment Gait with Vertical Head Turns: Mild Impairment Gait and Pivot Turn: Mild Impairment Step Over Obstacle: Mild Impairment Step Around Obstacles: Mild Impairment Steps: Mild Impairment Total Score: 16      Cognition Arousal/Alertness: Awake/alert Behavior During Therapy: WFL for tasks assessed/performed Overall Cognitive Status: Within Functional Limits for tasks assessed                                 General Comments: pt aware he isn't safe to return home alone and that he needs to live with someone and reports that he has a large family and that shouldn't be a problem        Exercises      General Comments General comments (skin integrity, edema, etc.): VSS      Pertinent Vitals/Pain Pain Assessment Pain Assessment: No/denies pain    Home Living                          Prior Function            PT Goals (current goals can now be found in the care plan section) Acute Rehab PT Goals Patient Stated Goal: get back to myself Progress towards PT goals: Progressing toward goals    Frequency    Min 4X/week      PT Plan Current plan remains  appropriate    Co-evaluation              AM-PAC PT "6 Clicks" Mobility   Outcome Measure  Help needed turning from your back to your side while in a flat bed without using bedrails?: None Help needed moving from lying on your back to sitting on the side of a flat bed without using bedrails?: None Help needed moving to and from a bed to a chair (including a wheelchair)?: A Lot Help needed standing up from a chair using your arms (e.g., wheelchair or bedside chair)?: A Lot Help needed to walk in hospital room?: A Lot Help needed climbing 3-5 steps with a railing? : A Lot 6 Click Score: 16    End of Session Equipment Utilized During Treatment: Gait belt Activity Tolerance: Patient tolerated treatment well Patient left: in chair;with call bell/phone within reach;with chair alarm set Nurse Communication: Mobility status PT Visit Diagnosis: Unsteadiness on feet (R26.81);Muscle weakness (generalized) (M62.81);Ataxic gait (R26.0);Other symptoms and signs involving the nervous system (R29.898)  Time: 9747-1855 PT Time Calculation (min) (ACUTE ONLY): 27 min  Charges:  $Gait Training: 23-37 mins                     Kittie Plater, PT, DPT Acute Rehabilitation Services Pager #: [phone] Office #: [phone]    Berline Lopes 02/24/2021, 1:02 PM

## 2021-02-24 NOTE — Assessment & Plan Note (Addendum)
Patient had very poor oral intake in the hospital.  He was given IV fluids.  Renal function is stable.  He has good urine output.  He was told to stay well-hydrated for the next several days.  He will need to have his renal function checked in the outpatient setting preferably next week.

## 2021-02-24 NOTE — Progress Notes (Signed)
[facility] PROGRESS NOTE   Joe Stewart SWN:[id number] DOB: 03-12-1955 DOA: 02/19/2021  4 DOS: the patient was seen and examined on 02/24/2021  PCP: [name], NP  Brief History and Hospital Course:  66 y.o. male with a past medical history of hypertension and diabetes who was in his usual state of health about 2 days prior to admission when he developed a headache.  Also reported visual disturbances.  Brother also reported the patient has been more confused than usual.  There was also some episode where he was finding it difficult to find words to speak.  Patient was brought into the emergency department.  Imaging studies showed acute stroke.  Patient was hospitalized for further management.  Patient was seen by stroke service.  Subsequently seen by physical therapy who recommended inpatient rehabilitation.  Waiting on CIR placement.  Creatinine noted to have crept up slightly.  Patient admitted that he has not been consuming fluids.  He will be gently hydrated for a few hours.  Recheck labs tomorrow.  Consultants: Neurology  Procedures: Transthoracic echocardiogram    Subjective: Feels well.  Denies any complaints   Assessment/Plan:  * Acute CVA (cerebrovascular accident) ([place])- (present on admission) CT head raises concern for left temporal lobe infarct.  No hemorrhage was noted.    MRI brain showed acute infarct in the posterior mesial temporal lobe.  Additional infarcts were noted all in the left PCA territory.  Other vascular abnormalities were also noted. LDL 124.  HbA1c 12.3. Echocardiogram shows moderate LVH with a EF of 70 to 75%.  Grade 1 diastolic dysfunction noted. Patient seen by the stroke service.  Plan is to place him on aspirin and Plavix for 3 weeks followed by aspirin alone. Seen by PT and OT.  Did not do well with physical therapy with unsteady gait and loss of balance.  They are recommending inpatient rehabilitation.  Inpatient rehab has been  consulted. Patient also exhibiting some signs of cognitive impairment.  This will need further work-up in the outpatient setting.  X38 folic acid levels were normal.  TSH was normal.  RPR is pending.   Waiting on insurance authorization before he can go to inpatient rehabilitation. Needs 30-day heart monitor after discharge.  Essential hypertension- (present on admission) Patient has been noncompliant with his antihypertensives.  Permissive hypertension was allowed. He has not been taking any of his antihypertensives including amlodipine hydrochlorothiazide losartan and metoprolol.   Based on previous records he is followed by [facility] [facility].  He has known right bundle branch block.  Had a cardiac cath in 2021 which showed diffuse disease but was being managed medically.  Did not require any PCI.  Does not take aspirin on a daily basis per patient. Blood pressure noted to be elevated.  Since it has been a few days since he had a stroke reasonable to resume antihypertensives.  Started back on amlodipine.  AKI (acute kidney injury) ([place]) Creatinine slowly trending upwards.  Likely due to lack of adequate fluid intake.  We will gently hydrate for 10 hours.  Patient educated to consume fluids.  Recheck labs tomorrow.  Monitor urine output.  Type 2 diabetes mellitus without complication, without long-term current use of insulin (HCC) Poorly controlled suggested by HbA1c of 12.3.  Has not been taking any of his oral medications.  Just on SSI here in the hospital.  Creatinine is high so we will hold off on initiating metformin.  Could be placed on glipizide.  Hyperlipidemia- (present on admission)  LDL noted to be 124.  Started back on statin.    Obesity Estimated body mass index is 39.38 kg/m as calculated from the following:   Height as of 01/29/20: 5\' 8"  (1.727 m).   Weight as of 01/29/20: 117.5 kg.    DVT Prophylaxis: Lovenox Code Status: Full code Family Communication: Discussed  with patient.  No family at bedside. Disposition Plan: Waiting on insurance authorization for inpatient rehabilitation placement  Status is: Inpatient  Remains inpatient appropriate because: Acute stroke.  Imbalance.        Medications: Scheduled:   stroke: mapping our early stages of recovery book   Does not apply Once   amLODipine  5 mg Oral Daily   aspirin  325 mg Oral Daily   atorvastatin  80 mg Oral Daily   clopidogrel  75 mg Oral Daily   enoxaparin (LOVENOX) injection  40 mg Subcutaneous Q24H   insulin aspart  0-20 Units Subcutaneous TID WC   insulin aspart  0-5 Units Subcutaneous QHS   Continuous:  sodium chloride 100 mL/hr at 02/24/21 0160   FUX:NATFTDDUKGURK **OR** acetaminophen (TYLENOL) oral liquid 160 mg/5 mL **OR** acetaminophen  Antibiotics: Anti-infectives (From admission, onward)    None       Objective:  Vital Signs  Vitals:   02/23/21 1638 02/23/21 1943 02/23/21 2315 02/24/21 0352  BP: 122/75 (!) 141/88 (!) 157/85 (!) 169/91  Pulse: 83 83 78 77  Resp: 16 20 18 16   Temp: 98 F (36.7 C) 98.7 F (37.1 C) 98.5 F (36.9 C) 98.2 F (36.8 C)  TempSrc: Oral Oral Oral Oral  SpO2: 100% 98% 99% 95%    Intake/Output Summary (Last 24 hours) at 02/24/2021 1107 Last data filed at 02/24/2021 0800 Gross per 24 hour  Intake 240 ml  Output --  Net 240 ml    There were no vitals filed for this visit.   General appearance: Awake alert.  In no distress Resp: Clear to auscultation bilaterally.  Normal effort Cardio: S1-S2 is normal regular.  No S3-S4.  No rubs murmurs or bruit GI: Abdomen is soft.  Nontender nondistended.  Bowel sounds are present normal.  No masses organomegaly Extremities: No edema.  Full range of motion of lower extremities.      Lab Results:  Data Reviewed: I have personally reviewed labs and imaging study reports  CBC: Recent Labs  Lab 02/19/21 1345 02/19/21 1403 02/20/21 0455 02/23/21 0207  WBC 7.4  --  7.5 7.5   NEUTROABS 4.2  --   --   --   HGB 14.6 14.6 14.0 13.5  HCT 42.5 43.0 40.1 40.0  MCV 90.6  --  89.9 91.3  PLT 219  --  201 206     Basic Metabolic Panel: Recent Labs  Lab 02/19/21 1345 02/19/21 1403 02/20/21 0455 02/23/21 0207 02/24/21 0356  NA 135 135 137 139 137  K 4.4 4.4 3.9 3.9 4.0  CL 99 100 100 103 105  CO2 25  --  28 26 24   GLUCOSE 304* 307* 162* 149* 182*  BUN 18 21 15 21  27*  CREATININE 1.06 1.00 1.01 1.25* 1.37*  CALCIUM 9.4  --  8.9 9.0 8.8*     GFR: CrCl cannot be calculated (Unknown ideal weight.).  Liver Function Tests: Recent Labs  Lab 02/19/21 1345 02/20/21 0455  AST 36 33  ALT 32 29  ALKPHOS 69 64  BILITOT 0.7 0.6  PROT 7.7 7.0  ALBUMIN 3.8 3.4*  Coagulation Profile: Recent Labs  Lab 02/19/21 1345  INR 1.1       CBG: Recent Labs  Lab 02/23/21 1152 02/23/21 1634 02/23/21 2121 02/24/21 0618 02/24/21 0804  GLUCAP 263* 192* 194* 172* 211*      Recent Results (from the past 240 hour(s))  Resp Panel by RT-PCR (Flu A&B, Covid) Nasopharyngeal Swab     Status: None   Collection Time: 02/19/21  1:39 PM   Specimen: Nasopharyngeal Swab; Nasopharyngeal(NP) swabs in vial transport medium  Result Value Ref Range Status   SARS Coronavirus 2 by RT PCR NEGATIVE NEGATIVE Final    Comment: (NOTE) SARS-CoV-2 target nucleic acids are NOT DETECTED.  The SARS-CoV-2 RNA is generally detectable in upper respiratory specimens during the acute phase of infection. The lowest concentration of SARS-CoV-2 viral copies this assay can detect is 138 copies/mL. A negative result does not preclude SARS-Cov-2 infection and should not be used as the sole basis for treatment or other patient management decisions. A negative result may occur with  improper specimen collection/handling, submission of specimen other than nasopharyngeal swab, presence of viral mutation(s) within the areas targeted by this assay, and inadequate number of  viral copies(<138 copies/mL). A negative result must be combined with clinical observations, patient history, and epidemiological information. The expected result is Negative.  Fact Sheet for Patients:  EntrepreneurPulse.com.au  Fact Sheet for Healthcare Providers:  IncredibleEmployment.be  This test is no t yet approved or cleared by the [place] FDA and  has been authorized for detection and/or diagnosis of SARS-CoV-2 by FDA under an Emergency Use Authorization (EUA). This EUA will remain  in effect (meaning this test can be used) for the duration of the COVID-19 declaration under Section 564(b)(1) of the Act, 21 U.S.C.section 360bbb-3(b)(1), unless the authorization is terminated  or revoked sooner.       Influenza A by PCR NEGATIVE NEGATIVE Final   Influenza B by PCR NEGATIVE NEGATIVE Final    Comment: (NOTE) The Xpert Xpress SARS-CoV-2/FLU/RSV plus assay is intended as an aid in the diagnosis of influenza from Nasopharyngeal swab specimens and should not be used as a sole basis for treatment. Nasal washings and aspirates are unacceptable for Xpert Xpress SARS-CoV-2/FLU/RSV testing.  Fact Sheet for Patients: EntrepreneurPulse.com.au  Fact Sheet for Healthcare Providers: IncredibleEmployment.be  This test is not yet approved or cleared by the [place] FDA and has been authorized for detection and/or diagnosis of SARS-CoV-2 by FDA under an Emergency Use Authorization (EUA). This EUA will remain in effect (meaning this test can be used) for the duration of the COVID-19 declaration under Section 564(b)(1) of the Act, 21 U.S.C. section 360bbb-3(b)(1), unless the authorization is terminated or revoked.  Performed at [facility], [address], [place], [place] [address]        Radiology Studies: No results found.     LOS: 4 days   Deckard Stuber [employer]  on [email or web address]  02/24/2021, 11:07 AM

## 2021-02-25 LAB — BASIC METABOLIC PANEL
Anion gap: 9 (ref 5–15)
BUN: 26 mg/dL — ABNORMAL HIGH (ref 8–23)
CO2: 24 mmol/L (ref 22–32)
Calcium: 8.8 mg/dL — ABNORMAL LOW (ref 8.9–10.3)
Chloride: 102 mmol/L (ref 98–111)
Creatinine, Ser: 1.42 mg/dL — ABNORMAL HIGH (ref 0.61–1.24)
GFR, Estimated: 54 mL/min — ABNORMAL LOW (ref >60)
Glucose, Bld: 162 mg/dL — ABNORMAL HIGH (ref 70–99)
Potassium: 4 mmol/L (ref 3.5–5.1)
Sodium: 135 mmol/L (ref 135–145)

## 2021-02-25 LAB — GLUCOSE, CAPILLARY
Glucose-Capillary: 187 mg/dL — ABNORMAL HIGH (ref 70–99)
Glucose-Capillary: 221 mg/dL — ABNORMAL HIGH (ref 70–99)

## 2021-02-25 MED ORDER — GLIMEPIRIDE 2 MG PO TABS
2.0000 mg | ORAL_TABLET | Freq: Every day | ORAL | 1 refills | Status: DC
Start: 2021-02-25 — End: 2021-03-28

## 2021-02-25 MED ORDER — AMLODIPINE BESYLATE 10 MG PO TABS: 10.0000 mg | ORAL_TABLET | Freq: Every day | ORAL | 2 refills | Status: DC

## 2021-02-25 MED ORDER — AMLODIPINE BESYLATE 10 MG PO TABS: 10.0000 mg | ORAL_TABLET | Freq: Every day | ORAL | Status: DC

## 2021-02-25 MED ORDER — BLOOD GLUCOSE MONITOR KIT: PACK | 0 refills | Status: AC

## 2021-02-25 MED ORDER — METOPROLOL SUCCINATE ER 25 MG PO TB24
25.0000 mg | ORAL_TABLET | Freq: Every day | ORAL | Status: DC
  Administered 2021-02-25: 12:00:00 25 mg via ORAL
  Filled 2021-02-25: qty 1

## 2021-02-25 MED ORDER — SODIUM CHLORIDE 0.45 % IV SOLN: INTRAVENOUS | Status: DC

## 2021-02-25 NOTE — Progress Notes (Signed)
Inpatient Rehab Admissions Coordinator:   Still awaiting insurance [employer].  Spoke to representative, Denton Ar, at [facility] this morning who offered to send a message to reviewer and let them know patient has been medically ready since Friday.  I will update pt/family at bedside this AM.  Shann Medal, PT, DPT Admissions Coordinator [phone] 02/25/21  10:26 AM

## 2021-02-25 NOTE — PMR Pre-admission (Shared)
PMR Admission Coordinator Pre-Admission Assessment ° °Patient: Joe Stewart is an 65 y.o., male °MRN: [id number] °DOB: 10/29/1955 °Height:   °Weight:   ° °Insurance Information °HMO: yes    PPO:      PCP:      IPA:      80/20:      OTHER:  °PRIMARY: [employer] Medicare      Policy#: [id number]      Subscriber: pt °CM Name: ***      Phone#: [phone]     Fax#: [phone] °Pre-Cert#: [id number] auth for CIR from *** with [employer] with updates due to fax listed above on ***      Employer:  °Benefits:  Phone #: [phone]     Name:  °Eff. Date: 02/02/21     Deduct: $0      Out of Pocket Max: $3600 ($0)      Life Max: n/a °CIR: $295/day for days 1-5      SNF: 20 full days °Outpatient:      Co-Pay: $20/visit °Home Health: 100%      Co-Pay:  °DME: 80%     Co-Ins: 20% °Providers:  °SECONDARY:       Policy#:      Phone#:  ° °Financial Counselor:       Phone#:  ° °The “Data Collection Information Summary” for patients in Inpatient Rehabilitation Facilities with attached “Privacy Act Statement-Health Care Records” was provided and verbally reviewed with: Patient and Family ° °Emergency Contact Information °Contact Information   ° ° Name Relation Home Work Mobile  ° Finney,Pam Sister [phone]  [phone]  ° Maurer,Brenton Son   [phone]  ° Service,[name] Brother   [phone]  ° °  ° ° °Current Medical History  °Patient Admitting Diagnosis: CVA  ° °History of Present Illness: Pt is a 65 y/o male with PMH Of HTN and DM who was admitted to [facility] on 1/18 with headache and visual changes.  Brother reports AMS as well.  CT head concerning for L temporal lobe infarct, no hemorrhage notes.  MRI showed acute infarct in the posterior mesial temporal lobe with additional infarcts noted in L PCA territory.  LDL 124, HbA1C 12.3, echo showed moderage LVH with EF 70-75% and G1 diastolic dysfunction.  Stroke service consulted and recommended aspirin and plavix x3 weeks, then aspirin alone.  Permissive hypertension initially then  gradually normalized and resumed antihypertensives on 1/23.  Pt did develop AKI and received gentle hydration and encouraged fluid intake.  DM counseling.  Therapy ongoing and pt was recommended for CIR.  ° °Complete NIHSS TOTAL: 0 ° °Patient's medical record from [facility] has been reviewed by the rehabilitation admission coordinator and physician. ° °Past Medical History  °Past Medical History:  °Diagnosis Date  ° Arthritis   ° Cataract   ° Mixed OU  ° Diabetes mellitus without complication (HCC)   ° Diabetic retinopathy (HCC)   ° NPDR OU  ° Hyperlipidemia   ° Hypertension   ° Hypertensive retinopathy   ° OU  ° Obesity   ° ° °Has the patient had major surgery during 100 days prior to admission? No ° °Family History   °family history includes COPD in his mother and sister; Cancer in his mother; Diabetes in his mother; Healthy in his brother and son; Heart disease in his brother and father. ° °Current Medications ° °Current Facility-Administered Medications:  °   stroke: mapping our early stages of recovery book, , Does not apply, Once, Krishnan, Gokul, MD °    0.45 % sodium chloride infusion, , Intravenous, Continuous, Bonnielee Haff, MD, Last Rate: 100 mL/hr at 02/25/21 0814, New Bag at 02/25/21 0814   acetaminophen (TYLENOL) tablet 650 mg, 650 mg, Oral, Q4H PRN **OR** acetaminophen (TYLENOL) 160 MG/5ML solution 650 mg, 650 mg, Per Tube, Q4H PRN **OR** acetaminophen (TYLENOL) suppository 650 mg, 650 mg, Rectal, Q4H PRN, Bonnielee Haff, MD   amLODipine (NORVASC) tablet 5 mg, 5 mg, Oral, Daily, Bonnielee Haff, MD, 5 mg at 02/24/21 0626   aspirin tablet 325 mg, 325 mg, Oral, Daily, Bonnielee Haff, MD, 325 mg at 02/24/21 0804   atorvastatin (LIPITOR) tablet 80 mg, 80 mg, Oral, Daily, Shafer, Devon, NP, 80 mg at 02/24/21 9485   clopidogrel (PLAVIX) tablet 75 mg, 75 mg, Oral, Daily, Bonnielee Haff, MD, 75 mg at 02/24/21 0804   enoxaparin (LOVENOX) injection 40 mg, 40 mg, Subcutaneous, Q24H, Bonnielee Haff,  MD, 40 mg at 02/24/21 1739   insulin aspart (novoLOG) injection 0-20 Units, 0-20 Units, Subcutaneous, TID WC, Bonnielee Haff, MD, 4 Units at 02/25/21 4627   insulin aspart (novoLOG) injection 0-5 Units, 0-5 Units, Subcutaneous, QHS, Bonnielee Haff, MD, 2 Units at 02/19/21 2213  Patients Current Diet:  Diet Order             Diet heart healthy/carb modified Room service appropriate? Yes with Assist; Fluid consistency: Thin  Diet effective now                   Precautions / Restrictions Precautions Precautions: Fall Precaution Comments: R lateral inattention Restrictions Weight Bearing Restrictions: No   Has the patient had 2 or more falls or a fall with injury in the past year? No  Prior Activity Level Community (5-7x/wk): independent prior to admission, no DME used, still driving, retired  Prior Functional Level Self Care: Did the patient need help bathing, dressing, using the toilet or eating? Independent  Indoor Mobility: Did the patient need assistance with walking from room to room (with or without device)? Independent  Stairs: Did the patient need assistance with internal or external stairs (with or without device)? Independent  Functional Cognition: Did the patient need help planning regular tasks such as shopping or remembering to take medications? Independent  Patient Information Are you of Hispanic, Latino/a,or Spanish origin?: A. No, not of Hispanic, Latino/a, or Spanish origin What is your race?: A. White Do you need or want an interpreter to communicate with a doctor or health care staff?: 0. No  Patient's Response To:  Health Literacy and Transportation Is the patient able to respond to health literacy and transportation needs?: Yes Health Literacy - How often do you need to have someone help you when you read instructions, pamphlets, or other written material from your doctor or pharmacy?: Never In the past 12 months, has lack of transportation kept you  from medical appointments or from getting medications?: No In the past 12 months, has lack of transportation kept you from meetings, work, or from getting things needed for daily living?: No  Development worker, international aid / [place] Devices/Equipment: None Home Equipment: Shower seat, Radio producer - single point  Prior Device Use: Indicate devices/aids used by the patient prior to current illness, exacerbation or injury? None of the above  Current Functional Level Cognition  Arousal/Alertness: Awake/alert Overall Cognitive Status: Within Functional Limits for tasks assessed Orientation Level: Oriented X4 General Comments: pt aware of his deficits stating "I was talking to my sister and I couldn't remember stroke. I think I'm having memory  issues again." Pt also stated "I feel like i'm walking drunk" when attempting to amb without AD with sneakers on. pt with good safety awareness as well. Pt with noted higher level processing deficits and sequencing multistep commands °Attention: Focused, Sustained, Selective °Focused Attention: Appears intact °Sustained Attention: Appears intact °Selective Attention: Appears intact °Memory: Impaired °Memory Impairment: Retrieval deficit (Immediate: 5/5; delayed: 0/5; with cues: 2/5) °Awareness: Appears intact °Problem Solving: Appears intact (Money problems: 1/2) °Executive Function: Sequencing, Organizing °Sequencing: Impaired °Sequencing Impairment: Verbal complex (clock drawing: 4/4 with additional processing time and self-correction) °Organizing: Appears intact (backward digit span: 2/2 with additional processing time and self-correction) °   °Extremity Assessment °(includes Sensation/Coordination) ° Upper Extremity Assessment: Overall WFL for tasks assessed  °Lower Extremity Assessment: RLE deficits/detail °RLE Deficits / Details: incoordination and mild weakness, Muscle fatigue during handwriting activity observed °RLE Coordination: decreased fine motor  °   °ADLs ° Overall ADL's : Needs assistance/impaired °Eating/Feeding: Independent, Sitting °Grooming: Wash/dry hands, Wash/dry face, Min guard, Standing °Upper Body Bathing: Set up, Independent, Sitting °Lower Body Bathing: Minimal assistance °Lower Body Bathing Details (indicate cue type and reason): simulated standig at sink °Upper Body Dressing : Set up, Independent, Sitting °Lower Body Dressing: Min guard, Sit to/from stand °Lower Body Dressing Details (indicate cue type and reason): donned clena gown standing °Toilet Transfer: Min guard, Ambulation, Regular Toilet °Toileting- Clothing Manipulation and Hygiene: Min guard, Sit to/from stand °Functional mobility during ADLs: Min guard °General ADL Comments: pt reports that his mobility is guarded when walking and that he feels "wobbly and off balance"  °  °Mobility ° Overal bed mobility: Needs Assistance °Bed Mobility: Supine to Sit °Supine to sit: Modified independent (Device/Increase time) °Sit to supine: Supervision °General bed mobility comments: bed placed with minimal HOB elevation to mimic home with pillows to elevate head. Pt able to bring self to EOB without assist or use of bed rail. mild increase in time  °  °Transfers ° Overall transfer level: Needs assistance °Equipment used: Rolling walker (2 wheels), None °Transfers: Sit to/from Stand °Sit to Stand: Min guard °Bed to/from chair/wheelchair/BSC transfer type:: Step pivot °Step pivot transfers: Min guard °General transfer comment: pt with good sit to stand without AD, verbal cues for safe hand placement when standing up to RW  °  °Ambulation / Gait / Stairs / Wheelchair Mobility ° Ambulation/Gait °Ambulation/Gait assistance: Min guard, Min assist °Gait Distance (Feet): 150 Feet (x1 with RW, 100x1 without AD) °Assistive device: Rolling walker (2 wheels), None °Gait Pattern/deviations: Step-through pattern, Decreased stride length °General Gait Details: pt vearing to R with RW, verbal cues to stay in  walker however pt with reciprocal, equal step length and height, not R LE instability, improved cadene compared to previous note and no LOB with use of RW. Pt then amb without AD or pushing IV pole. PT provided stability via gait belt at posterior hips. Pt with noted instability, step gait pattern and wider base of support °Gait velocity: faster with RW, slower without AD °Gait velocity interpretation: <1.31 ft/sec, indicative of household ambulator °Pre-gait activities: standing balance challenge to see direction of LOB °Stairs: Yes °Stair Management: One rail Right, Two rails, Alternating pattern, Forwards °Number of Stairs: 2 (x3 sets) °General stair comments: pt reports having 3 steps to enter Brother's home without handrails. Pt able to lead with R foot and complete reciprocal gait pattern with both bilat HRs and unilateral hand rail. When attempting without HR pt unsteady and required minA to prevent fall. PT provided   R HH support, pt able to complete step to pattern up/down steps  °  °Posture / Balance Dynamic Sitting Balance °Sitting balance - Comments: able to sit EOB without assist even after degradation of function °Balance °Overall balance assessment: Needs assistance °Sitting-balance support: Feet supported °Sitting balance-Leahy Scale: Good °Sitting balance - Comments: able to sit EOB without assist even after degradation of function °Standing balance support: No upper extremity supported, During functional activity °Standing balance-Leahy Scale: Fair °Standing balance comment: able to wash hands at sink without leaning on sink, noted wide base of support °Standardized Balance Assessment °Standardized Balance Assessment : Dynamic Gait Index °Berg Balance Test °Sit to Stand: Able to stand  independently using hands °Standing Unsupported: Able to stand 2 minutes with supervision °Sitting with Back Unsupported but Feet Supported on Floor or Stool: Able to sit safely and securely 2 minutes °Stand to Sit:  Controls descent by using hands °Transfers: Able to transfer safely, definite need of hands °Standing Unsupported with Eyes Closed: Able to stand 10 seconds with supervision °Standing Ubsupported with Feet Together: Able to place feet together independently and stand for 1 minute with supervision °From Standing, Reach Forward with Outstretched Arm: Can reach forward >12 cm safely (5") °From Standing Position, Pick up Object from Floor: Able to pick up shoe, needs supervision °From Standing Position, Turn to Look Behind Over each Shoulder: Looks behind one side only/other side shows less weight shift °Turn 360 Degrees: Able to turn 360 degrees safely in 4 seconds or less °Standing Unsupported, Alternately Place Feet on Step/Stool: Needs assistance to keep from falling or unable to try °Standing Unsupported, One Foot in Front: Able to plae foot ahead of the other independently and hold 30 seconds °Standing on One Leg: Able to lift leg independently and hold equal to or more than 3 seconds °Total Score: 40 °Dynamic Gait Index °Level Surface: Mild Impairment °Change in Gait Speed: Mild Impairment °Gait with Horizontal Head Turns: Mild Impairment °Gait with Vertical Head Turns: Mild Impairment °Gait and Pivot Turn: Mild Impairment °Step Over Obstacle: Mild Impairment °Step Around Obstacles: Mild Impairment °Steps: Mild Impairment °Total Score: 16  °  °Special needs/care consideration Diabetic management yes  ° °Previous Home Environment (from acute therapy documentation) °Living Arrangements: Alone °Available Help at Discharge: Family, Friend(s) °Type of Home: House °Home Layout: One level °Home Access: Stairs to enter °Entrance Stairs-Rails: Left °Entrance Stairs-Number of Steps: 2 °Bathroom Shower/Tub: Tub/shower unit °Bathroom Toilet: Handicapped height °Home Care Services: No ° °Discharge Living Setting °Plans for Discharge Living Setting: Lives with (comment) (brother's home) °Type of Home at Discharge:  House °Discharge Home Layout: Laundry or work area in basement, One level °Discharge Home Access: Stairs to enter °Entrance Stairs-Rails: None °Entrance Stairs-Number of Steps: 3 °Discharge Bathroom Shower/Tub: Tub/shower unit °Discharge Bathroom Toilet: Standard °Discharge Bathroom Accessibility: Yes °How Accessible: Accessible via walker °Does the patient have any problems obtaining your medications?: No ° °Social/Family/Support Systems °Anticipated Caregiver: sister, Pam, is primary contact (will stay with his brother and has 6 siblings to provide 24/7 supervision if needed) °Anticipated Caregiver's Contact Information: Pam [phone] °Ability/Limitations of Caregiver: n/a °Caregiver Availability: 24/7 °Discharge Plan Discussed with Primary Caregiver: Yes °Is Caregiver In Agreement with Plan?: Yes °Does Caregiver/Family have Issues with Lodging/Transportation while Pt is in Rehab?: No ° °Goals °Patient/Family Goal for Rehab: PT/OT supervision to mod I, SLP mod I °Expected length of stay: 6-9 days °Pt/Family Agrees to Admission and willing to participate: Yes °Program Orientation Provided & Reviewed with Pt/Caregiver Including   Roles  & Responsibilities: Yes  Barriers to Discharge: Insurance for SNF coverage  Decrease burden of Care through IP rehab admission: n/a  Possible need for SNF placement upon discharge: No  Patient Condition: I have reviewed medical records from [facility], spoken with  [facility] team , and patient and family member. I met with patient at the bedside for inpatient rehabilitation assessment.  Patient will benefit from ongoing PT, OT, and SLP, can actively participate in 3 hours of therapy a day 5 days of the week, and can make measurable gains during the admission.  Patient will also benefit from the coordinated team approach during an Inpatient Acute Rehabilitation admission.  The patient will receive intensive therapy as well as Rehabilitation physician, nursing, social worker, and  care management interventions.  Due to safety, disease management, medication administration, pain management, and patient education the patient requires 24 hour a day rehabilitation nursing.  The patient is currently min assist with mobility and basic ADLs.  Discharge setting and therapy post discharge at home with outpatient is anticipated.  Patient has agreed to participate in the Acute Inpatient Rehabilitation Program and will admit ***.  Preadmission Screen Completed By:  Michel Santee, PT, DPT1/24/2023 10:31 AM ______________________________________________________________________   Discussed status with Dr. Marland Kitchen on *** at *** and received approval for admission today.  Admission Coordinator:  Michel Santee, PT, DPT time Marland KitchenSudie Grumbling ***   Assessment/Plan: Diagnosis: Does the need for close, 24 hr/day Medical supervision in concert with the patient's rehab needs make it unreasonable for this patient to be served in a less intensive setting? {yes_no_potentially:3041433} Co-Morbidities requiring supervision/potential complications: *** Due to {due VX:4801655}, does the patient require 24 hr/day rehab nursing? {yes_no_potentially:3041433} Does the patient require coordinated care of a physician, rehab nurse, PT, OT, and SLP to address physical and functional deficits in the context of the above medical diagnosis(es)? {yes_no_potentially:3041433} Addressing deficits in the following areas: {deficits:3041436} Can the patient actively participate in an intensive therapy program of at least 3 hrs of therapy 5 days a week? {yes_no_potentially:3041433} The potential for patient to make measurable gains while on inpatient rehab is {potential:3041437} Anticipated functional outcomes upon discharge from inpatient rehab: {functional outcomes:304600100} PT, {functional outcomes:304600100} OT, {functional outcomes:304600100} SLP Estimated rehab length of stay to reach the above functional goals is:  *** Anticipated discharge destination: {anticipated dc setting:21604} 10. Overall Rehab/Functional Prognosis: {potential:3041437}   MD Signature: ***

## 2021-02-25 NOTE — Progress Notes (Signed)
Physical Therapy Treatment Patient Details Name: Joe Stewart MRN: [id number] DOB: [birth year] Today's Date: 02/25/2021   History of Present Illness 66 y.o. male presented emergency department 1/18 with visual deficit and confusion for 1 week.  Patient reports that he developed a headache, on the left temporal region,  which is resolved.  He said he felt woozy and lightheaded.  He noted that his vision seemed off, with missing pieces in his visual field at least 2 to 3 days a He lives by himself. His brother reported that the patient seems confused with simple tasks today, such as closing the windows in the car, and also getting himself dressed.  PMHx:  DM, diabetic retinopathy, HTN    PT Comments    Pt progressing well towards all goals. Pt with noted instability when ambulating with sneakers today compared to non-skid socks requiring RW for safe ambulation. Pt with noted impaired executive functioning skills and inability to follow/complete multistep commands without assist. Spoke with pt's brother who states pt will be coming home with him and he will be providing 24/7 assist and would be able to take him to outpt PT. Brother feels comfortable assisting pt at the supervision/min guard level. Acute PT to cont to follow to progress towards independence.    Recommendations for follow up therapy are one component of a multi-disciplinary discharge planning process, led by the attending physician.  Recommendations may be updated based on patient status, additional functional criteria and insurance authorization.  Follow Up Recommendations  Outpatient PT     Assistance Recommended at Discharge Frequent or constant Supervision/Assistance  Patient can return home with the following A little help with walking and/or transfers;A little help with bathing/dressing/bathroom;Assistance with cooking/housework;Direct supervision/assist for medications management;Direct supervision/assist for financial  management;Assist for transportation;Help with stairs or ramp for entrance   Equipment Recommendations  Rolling walker (2 wheels)    Recommendations for Other Services       Precautions / Restrictions Precautions Precautions: Fall Restrictions Weight Bearing Restrictions: No     Mobility  Bed Mobility Overal bed mobility: Needs Assistance Bed Mobility: Supine to Sit     Supine to sit: Modified independent (Device/Increase time)     General bed mobility comments: bed placed with minimal HOB elevation to mimic home with pillows to elevate head. Pt able to bring self to EOB without assist or use of bed rail. mild increase in time    Transfers Overall transfer level: Needs assistance Equipment used: Rolling walker (2 wheels), None Transfers: Sit to/from Stand Sit to Stand: Min guard           General transfer comment: pt with good sit to stand without AD, verbal cues for safe hand placement when standing up to RW    Ambulation/Gait Ambulation/Gait assistance: Min guard, Min assist Gait Distance (Feet): 175 Feet (x1, 200x1) Assistive device: Rolling walker (2 wheels), None, IV Pole Gait Pattern/deviations: Step-through pattern, Decreased stride length, Wide base of support, Staggering left, Staggering right Gait velocity: faster with RW, slower without AD Gait velocity interpretation: 1.31 - 2.62 ft/sec, indicative of limited community ambulator   General Gait Details: pt with sneakers on this date, pt initially very unsteady and reports "man this is weird, I feel like i'm walking drunk" in regard to walking with sneakers on. Pt with noted instability and ultimately held onto IV pole but continued to required minA for balance and assist for IV pole management. Pt then given RW, pt with improved step height and length with improved  fluidity and speed. Pt reports "I feel much better with this"   Stairs Stairs: Yes Stairs assistance: Min guard Stair Management: One rail  Right, Two rails, Alternating pattern, Forwards Number of Stairs: 6 General stair comments: pt completed reciprocally with bilat hand rails and unilateral handrail, completed step to pattern with HHA and no handrail which is his home set up   Wheelchair Mobility    Modified Rankin (Stroke Patients Only) Modified Rankin (Stroke Patients Only) Pre-Morbid Rankin Score: No symptoms Modified Rankin: Moderate disability     Balance Overall balance assessment: Needs assistance Sitting-balance support: Feet supported Sitting balance-Leahy Scale: Good Sitting balance - Comments: able to don socks and shoes without LOB at EOB   Standing balance support: No upper extremity supported Standing balance-Leahy Scale: Fair                              Cognition Arousal/Alertness: Awake/alert Behavior During Therapy: WFL for tasks assessed/performed Overall Cognitive Status: Within Functional Limits for tasks assessed                                 General Comments: pt aware of his deficits stating "I was talking to my sister and I couldn't remember stroke. I think I'm having memory issues again." Pt also stated "I feel like i'm walking drunk" when attempting to amb without AD with sneakers on. pt with good safety awareness as well. Pt with noted higher level processing deficits and sequencing multistep commands        Exercises      General Comments General comments (skin integrity, edema, etc.): VSS      Pertinent Vitals/Pain Pain Assessment Pain Assessment: No/denies pain    Home Living                          Prior Function            PT Goals (current goals can now be found in the care plan section) Acute Rehab PT Goals Patient Stated Goal: get my memory back Progress towards PT goals: Progressing toward goals    Frequency    Min 4X/week      PT Plan Discharge plan needs to be updated    Co-evaluation               AM-PAC PT "6 Clicks" Mobility   Outcome Measure  Help needed turning from your back to your side while in a flat bed without using bedrails?: None Help needed moving from lying on your back to sitting on the side of a flat bed without using bedrails?: None Help needed moving to and from a bed to a chair (including a wheelchair)?: A Little Help needed standing up from a chair using your arms (e.g., wheelchair or bedside chair)?: A Little Help needed to walk in hospital room?: A Little Help needed climbing 3-5 steps with a railing? : A Little 6 Click Score: 20    End of Session Equipment Utilized During Treatment: Gait belt Activity Tolerance: Patient tolerated treatment well Patient left: in chair;with call bell/phone within reach;with chair alarm set Nurse Communication: Mobility status PT Visit Diagnosis: Unsteadiness on feet (R26.81);Muscle weakness (generalized) (M62.81);Ataxic gait (R26.0);Other symptoms and signs involving the nervous system (G28.366)     Time: 2947-6546 PT Time Calculation (min) (ACUTE ONLY): 26 min  Charges:  $  Gait Training: 23-37 mins                     Kittie Plater, PT, DPT Acute Rehabilitation Services Pager #: [phone] Office #: [phone]    Berline Lopes 02/25/2021, 11:36 AM

## 2021-02-25 NOTE — Plan of Care (Signed)
°  Problem: Education: Goal: Knowledge of General Education information will improve Description: Including pain rating scale, medication(s)/side effects and non-pharmacologic comfort measures 02/25/2021 0221 by Nancie Neas, RN Outcome: Progressing 02/25/2021 0218 by Nancie Neas, RN Outcome: Progressing   Problem: Health Behavior/Discharge Planning: Goal: Ability to manage health-related needs will improve 02/25/2021 0221 by Nancie Neas, RN Outcome: Progressing 02/25/2021 0218 by Nancie Neas, RN Outcome: Progressing   Problem: Clinical Measurements: Goal: Ability to maintain clinical measurements within normal limits will improve Outcome: Progressing   Problem: Safety: Goal: Ability to remain free from injury will improve Outcome: Progressing   Problem: Education: Goal: Knowledge of disease or condition will improve 02/25/2021 0221 by Nancie Neas, RN Outcome: Progressing 02/25/2021 0218 by Nancie Neas, RN Outcome: Progressing Goal: Knowledge of secondary prevention will improve (SELECT ALL) 02/25/2021 0221 by Nancie Neas, RN Outcome: Progressing 02/25/2021 0218 by Nancie Neas, RN Outcome: Progressing Goal: Knowledge of patient specific risk factors will improve (INDIVIDUALIZE FOR PATIENT) Outcome: Progressing Goal: Individualized Educational Video(s) Outcome: Progressing

## 2021-02-25 NOTE — TOC Transition Note (Signed)
Transition of Care [facility]) - CM/SW Discharge Note   Patient Details  Name: Fumio Vandam MRN: [id number] Date of Birth: 04/06/1955  [facility] CM/SW Contact:  Pollie Friar, RN Phone Number: 02/25/2021, 11:30 AM   Clinical Narrative:    Patient has progressed and PT/OT now recommending outpatient therapy. Patient and brother are in agreement. CM spoke to patients brother and he prefers Health and safety inspector for outpatient therapy. Orders in Epic and information on the AVS.  Pt has walker at home and CM verified this with brother in law at bedside.  Brother will oversee his home medications and provide needed transportation.  Brother in law will provide transport home today.   Final next level of care: OP Rehab Barriers to Discharge: No Barriers Identified   Patient Goals and CMS Choice   CMS Medicare.gov Compare Post Acute Care list provided to:: Patient Represenative (must comment) Choice offered to / list presented to : Sibling  Discharge Placement                       Discharge Plan and Services                DME Arranged:  (Ststed he has all DNE eeded)                    Social Determinants of Health (SDOH) Interventions     Readmission Risk Interventions No flowsheet data found.

## 2021-02-25 NOTE — Progress Notes (Signed)
Nsg Discharge Note  Admit Date:  02/19/2021 Discharge date: 02/25/2021   Joe Stewart to be D/C'd Home per MD order.  AVS completed. Patient/caregiver able to verbalize understanding.  Discharge Medication: Allergies as of 02/25/2021   No Known Allergies      Medication List     STOP taking these medications    [facility] Aspirin Adult Low Dose 81 MG chewable tablet Generic drug: aspirin Replaced by: aspirin EC 81 MG tablet   hydrochlorothiazide 25 MG tablet Commonly known as: HYDRODIURIL   Janumet 50-1000 MG tablet Generic drug: sitaGLIPtin-metformin   losartan 50 MG tablet Commonly known as: COZAAR   omeprazole 40 MG capsule Commonly known as: PRILOSEC       TAKE these medications    amLODipine 10 MG tablet Commonly known as: NORVASC Take 1 tablet (10 mg total) by mouth daily. Start taking on: February 26, 2021 What changed: when to take this   aspirin EC 81 MG tablet Take 1 tablet (81 mg total) by mouth daily. Swallow whole. Replaces: [employer] Aspirin Adult Low Dose 81 MG chewable tablet   atorvastatin 80 MG tablet Commonly known as: LIPITOR Take 1 tablet (80 mg total) by mouth daily. What changed:  medication strength how much to take   blood glucose meter kit and supplies Kit Dispense based on patient and insurance preference. Use up to four times daily as directed.   clopidogrel 75 MG tablet Commonly known as: PLAVIX Take 1 tablet (75 mg total) by mouth daily for 21 days.   glimepiride 2 MG tablet Commonly known as: AMARYL Take 1 tablet (2 mg total) by mouth daily with breakfast.   metoprolol succinate 25 MG 24 hr tablet Commonly known as: TOPROL-XL Take 1 tablet (25 mg total) by mouth daily. What changed: additional instructions               Durable Medical Equipment  (From admission, onward)           Start     Ordered   02/25/21 1056  For home use only DME Walker rolling  Once       Question Answer Comment  Walker: With [place]    Patient needs a walker to treat with the following condition Stroke ([place])      02/25/21 1055            Discharge Assessment: Vitals:   02/25/21 0747 02/25/21 1129  BP: (!) 185/93 (!) 158/92  Pulse: 81 79  Resp: 16 16  Temp:  98.2 F (36.8 C)  SpO2: 100% 99%   Skin clean, dry and intact without evidence of skin break down, no evidence of skin tears noted. IV catheter discontinued intact. Site without signs and symptoms of complications - no redness or edema noted at insertion site, patient denies c/o pain - only slight tenderness at site.  Dressing with slight pressure applied.  D/c Instructions-Education: Discharge instructions given to patient/family with verbalized understanding. D/c education completed with patient/family including follow up instructions, medication list, d/c activities limitations if indicated, with other d/c instructions as indicated by MD - patient able to verbalize understanding, all questions fully answered. Patient instructed to return to ED, call 911, or call MD for any changes in condition.  Patient escorted via [place], and D/C home via private auto.  Atilano Ina, RN 02/25/2021 12:44 PM

## 2021-02-25 NOTE — Discharge Summary (Signed)
[facility]  Physician Discharge Summary   Patient ID: Joe Stewart MRN: [id number] DOB/AGE: 10-Mar-1955 66 y.o.  Admit date: 02/19/2021 Discharge date:   02/25/2021   PCP: [name], NP  DISCHARGE DIAGNOSES:  Principal Problem:   Acute CVA (cerebrovascular accident) [facility]) Active Problems:   Essential hypertension   Type 2 diabetes mellitus without complication, without long-term current use of insulin (HCC)   AKI (acute kidney injury) ([place])   Hyperlipidemia   RECOMMENDATIONS FOR OUTPATIENT FOLLOW UP: Referral sent for outpatient PT and OT Needs to be seen by PCP in 1 week for blood work to check renal function Patient knows to check his glucose levels at home 30-day heart monitor to be arranged by cardiology, [facility] cardiovascular.    Home Health: Outpatient PT OT SLP Equipment/Devices: Rolling walker  CODE STATUS: Full code  DISCHARGE CONDITION: fair  Diet recommendation: Modified carbohydrate  INITIAL HISTORY: 66 y.o. male with a past medical history of hypertension and diabetes who was in his usual state of health about 2 days prior to admission when he developed a headache.  Also reported visual disturbances.  Brother also reported the patient has been more confused than usual.  There was also some episode where he was finding it difficult to find words to speak.  Patient was brought into the emergency department.  Imaging studies showed acute stroke.  Patient was hospitalized for further management.  Patient was seen by stroke service.    Consultations: Neurology  Procedures: Transthoracic echocardiogram    HOSPITAL COURSE:    * Acute CVA (cerebrovascular accident) ([place])- (present on admission) CT head raises concern for left temporal lobe infarct.  No hemorrhage was noted.    MRI brain showed acute infarct in the posterior mesial temporal lobe.  Additional infarcts were noted all in the left PCA territory.  Other vascular abnormalities were  also noted. LDL 124.  HbA1c 12.3. Echocardiogram shows moderate LVH with a EF of 70 to 75%.  Grade 1 diastolic dysfunction noted. Patient seen by the stroke service.  Plan is to place him on aspirin and Plavix for 3 weeks followed by aspirin alone. Seen by PT and OT.  Did not do well with physical therapy with unsteady gait and loss of balance.  They are recommending inpatient rehabilitation.  Inpatient rehab has been consulted. Patient also exhibiting some signs of cognitive impairment.  This will need further work-up in the outpatient setting.  O96 folic acid levels were normal.  TSH was normal.  RPR is nonreactive.   Initially plan was to go to inpatient rehab but patient has made significant improvement.  He is normal to go home with outpatient PT and OT.  Neurology did recommend a 30-day heart monitor.  Message sent to cardiology to arrange.  Essential hypertension- (present on admission) Patient has been noncompliant with his antihypertensives.  Permissive hypertension was allowed. He has not been taking any of his antihypertensives including amlodipine hydrochlorothiazide losartan and metoprolol.   Based on previous records he is followed by [place] [facility].  He has known right bundle branch block.  Had a cardiac cath in 2021 which showed diffuse disease but was being managed medically.  Did not require any PCI.  Does not take aspirin on a daily basis per patient. Amlodipine and metoprolol will be resumed.  AKI (acute kidney injury) [facility]) Patient had very poor oral intake in the hospital.  He was given IV fluids.  Renal function is stable.  He has good urine output.  He  was told to stay well-hydrated for the next several days.  He will need to have his renal function checked in the outpatient setting preferably next week.  Type 2 diabetes mellitus without complication, without long-term current use of insulin (HCC) Poorly controlled suggested by HbA1c of 12.3.  Has not been taking  any of his oral medications.  Will be started on glimepiride at discharge.  Glucometer will be prescribed.  Close outpatient follow-up with PCP.  Hyperlipidemia- (present on admission) LDL noted to be 124.  Started back on statin.      Obesity Estimated body mass index is 39.38 kg/m as calculated from the following:   Height as of 01/29/20: _0  (1.727 m).   Weight as of 01/29/20: 117.5 kg.  Okay for discharge home today.   PERTINENT LABS:  The results of significant diagnostics from this hospitalization (including imaging, microbiology, ancillary and laboratory) are listed below for reference.    Microbiology: Recent Results (from the past 240 hour(s))  Resp Panel by RT-PCR (Flu A&B, Covid) Nasopharyngeal Swab     Status: None   Collection Time: 02/19/21  1:39 PM   Specimen: Nasopharyngeal Swab; Nasopharyngeal(NP) swabs in vial transport medium  Result Value Ref Range Status   SARS Coronavirus 2 by RT PCR NEGATIVE NEGATIVE Final    Comment: (NOTE) SARS-CoV-2 target nucleic acids are NOT DETECTED.  The SARS-CoV-2 RNA is generally detectable in upper respiratory specimens during the acute phase of infection. The lowest concentration of SARS-CoV-2 viral copies this assay can detect is 138 copies/mL. A negative result does not preclude SARS-Cov-2 infection and should not be used as the sole basis for treatment or other patient management decisions. A negative result may occur with  improper specimen collection/handling, submission of specimen other than nasopharyngeal swab, presence of viral mutation(s) within the areas targeted by this assay, and inadequate number of viral copies(<138 copies/mL). A negative result must be combined with clinical observations, patient history, and epidemiological information. The expected result is Negative.  Fact Sheet for Patients:  EntrepreneurPulse.com.au  Fact Sheet for Healthcare Providers:   IncredibleEmployment.be  This test is no t yet approved or cleared by the [place] FDA and  has been authorized for detection and/or diagnosis of SARS-CoV-2 by FDA under an Emergency Use Authorization (EUA). This EUA will remain  in effect (meaning this test can be used) for the duration of the COVID-19 declaration under Section 564(b)(1) of the Act, 21 U.S.C.section 360bbb-3(b)(1), unless the authorization is terminated  or revoked sooner.       Influenza A by PCR NEGATIVE NEGATIVE Final   Influenza B by PCR NEGATIVE NEGATIVE Final    Comment: (NOTE) The Xpert Xpress SARS-CoV-2/FLU/RSV plus assay is intended as an aid in the diagnosis of influenza from Nasopharyngeal swab specimens and should not be used as a sole basis for treatment. Nasal washings and aspirates are unacceptable for Xpert Xpress SARS-CoV-2/FLU/RSV testing.  Fact Sheet for Patients: EntrepreneurPulse.com.au  Fact Sheet for Healthcare Providers: IncredibleEmployment.be  This test is not yet approved or cleared by the [place] FDA and has been authorized for detection and/or diagnosis of SARS-CoV-2 by FDA under an Emergency Use Authorization (EUA). This EUA will remain in effect (meaning this test can be used) for the duration of the COVID-19 declaration under Section 564(b)(1) of the Act, 21 U.S.C. section 360bbb-3(b)(1), unless the authorization is terminated or revoked.  Performed at [facility], [place] [address], [place], [place] [address]  Labs:  COVID-19 Labs    Lab Results  Component Value Date   SARSCOV2NAA NEGATIVE 02/19/2021   [place] NEGATIVE 11/25/2019   SARSCOV2NAA NEGATIVE 07/28/2019   SARSCOV2NAA RESULT: NEGATIVE 06/19/2019      Basic Metabolic Panel: Recent Labs  Lab 02/19/21 1345 02/19/21 1403 02/20/21 0455 02/23/21 0207 02/24/21 0356 02/25/21 0331  NA 135 135 137 139 137 135  K 4.4 4.4 3.9  3.9 4.0 4.0  CL 99 100 100 103 105 102  CO2 25  --  _0 GLUCOSE 304* 307* 162* 149* 182* 162*  BUN _1 27* 26*  CREATININE 1.06 1.00 1.01 1.25* 1.37* 1.42*  CALCIUM 9.4  --  8.9 9.0 8.8* 8.8*   Liver Function Tests: Recent Labs  Lab 02/19/21 1345 02/20/21 0455  AST 36 33  ALT 32 29  ALKPHOS 69 64  BILITOT 0.7 0.6  PROT 7.7 7.0  ALBUMIN 3.8 3.4*    CBC: Recent Labs  Lab 02/19/21 1345 02/19/21 1403 02/20/21 0455 02/23/21 0207  WBC 7.4  --  7.5 7.5  NEUTROABS 4.2  --   --   --   HGB 14.6 14.6 14.0 13.5  HCT 42.5 43.0 40.1 40.0  MCV 90.6  --  89.9 91.3  PLT 219  --  201 206     CBG: Recent Labs  Lab 02/24/21 1129 02/24/21 1639 02/24/21 2128 02/25/21 0631 02/25/21 1144  GLUCAP 207* 148* 193* 187* 221*     IMAGING STUDIES CT HEAD WO CONTRAST  Result Date: 02/19/2021 CLINICAL DATA:  TIA.  Aphasia. EXAM: CT HEAD WITHOUT CONTRAST TECHNIQUE: Contiguous axial images were obtained from the base of the skull through the vertex without intravenous contrast. RADIATION DOSE REDUCTION: This exam was performed according to the departmental dose-optimization program which includes automated exposure control, adjustment of the mA and/or kV according to patient size and/or use of iterative reconstruction technique. COMPARISON:  None. FINDINGS: Brain: There is hypoattenuation involving cortex and white matter in the posterior mesial left temporal lobe. No intracranial hemorrhage, midline shift, or extra-axial fluid collection is identified. The ventricles are normal in size. Vascular: Calcified atherosclerosis at the skull base. No hyperdense vessel. Skull: No fracture or suspicious osseous lesion. Sinuses/Orbits: Mild mucosal thickening in the right maxillary sinus. Clear mastoid air cells. Unremarkable orbits. Other: None. IMPRESSION: 1. Left temporal lobe hypodensity most suggestive of an acute or subacute PCA infarct. Brain MRI is recommended for further evaluation.  2. No intracranial hemorrhage. Electronically Signed   By: Logan Bores M.D.   On: 02/19/2021 14:17   MR ANGIO HEAD WO CONTRAST  Result Date: 02/19/2021 CLINICAL DATA:  Stroke, TIA, determine embolic source; headache and vision impairment with 2 days of blurred vision EXAM: MRI HEAD WITHOUT CONTRAST MRA HEAD WITHOUT CONTRAST MRA NECK WITHOUT AND WITH CONTRAST TECHNIQUE: Multiplanar, multi-echo pulse sequences of the brain and surrounding structures were acquired without intravenous contrast. Angiographic images of the Circle of Willis were acquired using MRA technique without intravenous contrast. Angiographic images of the neck were acquired using MRA technique without and with intravenous contrast. Carotid stenosis measurements (when applicable) are obtained utilizing NASCET criteria, using the distal internal carotid diameter as the denominator. CONTRAST:  35m GADAVIST GADOBUTROL 1 MMOL/ML IV SOLN COMPARISON:  No prior MRI or MRA. Correlation is made with CT head 02/19/2021 FINDINGS: MRI HEAD FINDINGS Brain: Restricted diffusion with ADC correlate in the mesial left temporal lobe, measuring up to 4.3 x 2.2 x 2.2 cm (  series 5, image 73 and series 7, image 48), which correlates with the hypodensity seen on the same-day CT. Additional scattered foci of restricted diffusion with ADC correlates in the left occipital lobe (series 5, image 75 and 76) as well as in the posterior left thalamus (series 5, image 77). No acute hemorrhage, mass, mass effect, or midline shift. Mildly increased T2 signal is associated with the previously mentioned areas of restricted diffusion, likely mild edema. No hydrocephalus or extra-axial collection. Vascular: Please see MRA findings below Skull and upper cervical spine: Normal marrow signal. Possible mild spinal canal stenosis C2-C3. Sinuses/Orbits: No acute or significant finding. Other: The mastoids are well aerated. MRA HEAD FINDINGS Anterior circulation: Both internal carotid  arteries are patent to the termini, without stenosis or other abnormality. A1 segments patent, with mild focal narrowing in the mid right A1 (series 1, image 101). Normal anterior communicating artery. Anterior cerebral arteries are patent to their distal aspects. No M1 stenosis or occlusion. Normal MCA bifurcations. Distal MCA branches perfused and symmetric. Posterior circulation: Vertebral arteries patent to the vertebrobasilar junction without stenosis. Right dominant. Basilar patent to its distal aspect. Superior cerebellar arteries patent bilaterally. Patent short right P1, with prominent right posterior communicating artery. The right PCA is well perfused. Poor visualization of the left P1, which likely shares a common origin with the left SCA (series 1, image 71) poor opacification of the P1 proximally (series 1, image 75-80), with patent left posterior communicating artery. Focal non opacification of the left P2 (series 1 images 85-89). Poor perfusion of the remainder of the left P2 (series 1, image 87), with nonvisualization of the left P3 segments. Anatomic variants: None significant MRA NECK FINDINGS Aortic arch: Three-vessel arch. No evidence of aneurysm, dissection, or significant proximal stenosis. Right carotid system: Patent, without significant stenosis, dissection, or occlusion. Left carotid system: Patent, without significant stenosis, dissection, or occlusion. Vertebral arteries: The right vertebral artery is visualized from its origin to the skull base. There is significant tortuosity in the midcervical spine (series 11, image 49). The left vertebral artery is diminutive and minimally visualized from just distal to its origin to the skull base, although it does appear patent. The origin of the left vertebral artery is not well evaluated, likely secondary to artifact. Other: None IMPRESSION: 1. Acute infarct in the posterior mesial temporal lobe, with additional scattered infarcts in the  posterior left thalamus and left occipital lobe, consistent with left PCA territory infarct. 2. Poor visualization of the left PCA, with non opacification of the proximal left P2, as well as P3 segments. Poor opacification of the left P1 and more distal P2 segment. No other significant intracranial stenosis. 3. Diminutive left vertebral artery, which is not dominant. This may be congenital and or may represent atherosclerotic stenosis. No other significant stenosis in the neck. Code stroke imaging results were communicated on 02/19/2021 at 6:38 pm to provider Dr. Lorrin Goodell via secure text paging. Electronically Signed   By: Merilyn Baba M.D.   On: 02/19/2021 18:39   MR Angiogram Neck W or Wo Contrast  Result Date: 02/19/2021 CLINICAL DATA:  Stroke, TIA, determine embolic source; headache and vision impairment with 2 days of blurred vision EXAM: MRI HEAD WITHOUT CONTRAST MRA HEAD WITHOUT CONTRAST MRA NECK WITHOUT AND WITH CONTRAST TECHNIQUE: Multiplanar, multi-echo pulse sequences of the brain and surrounding structures were acquired without intravenous contrast. Angiographic images of the Circle of Willis were acquired using MRA technique without intravenous contrast. Angiographic images of the neck were  acquired using MRA technique without and with intravenous contrast. Carotid stenosis measurements (when applicable) are obtained utilizing NASCET criteria, using the distal internal carotid diameter as the denominator. CONTRAST:  73m GADAVIST GADOBUTROL 1 MMOL/ML IV SOLN COMPARISON:  No prior MRI or MRA. Correlation is made with CT head 02/19/2021 FINDINGS: MRI HEAD FINDINGS Brain: Restricted diffusion with ADC correlate in the mesial left temporal lobe, measuring up to 4.3 x 2.2 x 2.2 cm (series 5, image 73 and series 7, image 48), which correlates with the hypodensity seen on the same-day CT. Additional scattered foci of restricted diffusion with ADC correlates in the left occipital lobe (series 5, image 75  and 76) as well as in the posterior left thalamus (series 5, image 77). No acute hemorrhage, mass, mass effect, or midline shift. Mildly increased T2 signal is associated with the previously mentioned areas of restricted diffusion, likely mild edema. No hydrocephalus or extra-axial collection. Vascular: Please see MRA findings below Skull and upper cervical spine: Normal marrow signal. Possible mild spinal canal stenosis C2-C3. Sinuses/Orbits: No acute or significant finding. Other: The mastoids are well aerated. MRA HEAD FINDINGS Anterior circulation: Both internal carotid arteries are patent to the termini, without stenosis or other abnormality. A1 segments patent, with mild focal narrowing in the mid right A1 (series 1, image 101). Normal anterior communicating artery. Anterior cerebral arteries are patent to their distal aspects. No M1 stenosis or occlusion. Normal MCA bifurcations. Distal MCA branches perfused and symmetric. Posterior circulation: Vertebral arteries patent to the vertebrobasilar junction without stenosis. Right dominant. Basilar patent to its distal aspect. Superior cerebellar arteries patent bilaterally. Patent short right P1, with prominent right posterior communicating artery. The right PCA is well perfused. Poor visualization of the left P1, which likely shares a common origin with the left SCA (series 1, image 71) poor opacification of the P1 proximally (series 1, image 75-80), with patent left posterior communicating artery. Focal non opacification of the left P2 (series 1 images 85-89). Poor perfusion of the remainder of the left P2 (series 1, image 87), with nonvisualization of the left P3 segments. Anatomic variants: None significant MRA NECK FINDINGS Aortic arch: Three-vessel arch. No evidence of aneurysm, dissection, or significant proximal stenosis. Right carotid system: Patent, without significant stenosis, dissection, or occlusion. Left carotid system: Patent, without significant  stenosis, dissection, or occlusion. Vertebral arteries: The right vertebral artery is visualized from its origin to the skull base. There is significant tortuosity in the midcervical spine (series 11, image 49). The left vertebral artery is diminutive and minimally visualized from just distal to its origin to the skull base, although it does appear patent. The origin of the left vertebral artery is not well evaluated, likely secondary to artifact. Other: None IMPRESSION: 1. Acute infarct in the posterior mesial temporal lobe, with additional scattered infarcts in the posterior left thalamus and left occipital lobe, consistent with left PCA territory infarct. 2. Poor visualization of the left PCA, with non opacification of the proximal left P2, as well as P3 segments. Poor opacification of the left P1 and more distal P2 segment. No other significant intracranial stenosis. 3. Diminutive left vertebral artery, which is not dominant. This may be congenital and or may represent atherosclerotic stenosis. No other significant stenosis in the neck. Code stroke imaging results were communicated on 02/19/2021 at 6:38 pm to provider Dr. KLorrin Goodellvia secure text paging. Electronically Signed   By: AMerilyn BabaM.D.   On: 02/19/2021 18:39   MR BRAIN WO CONTRAST  Result Date: 02/19/2021 CLINICAL DATA:  Stroke, TIA, determine embolic source; headache and vision impairment with 2 days of blurred vision EXAM: MRI HEAD WITHOUT CONTRAST MRA HEAD WITHOUT CONTRAST MRA NECK WITHOUT AND WITH CONTRAST TECHNIQUE: Multiplanar, multi-echo pulse sequences of the brain and surrounding structures were acquired without intravenous contrast. Angiographic images of the Circle of Willis were acquired using MRA technique without intravenous contrast. Angiographic images of the neck were acquired using MRA technique without and with intravenous contrast. Carotid stenosis measurements (when applicable) are obtained utilizing NASCET criteria,  using the distal internal carotid diameter as the denominator. CONTRAST:  47m GADAVIST GADOBUTROL 1 MMOL/ML IV SOLN COMPARISON:  No prior MRI or MRA. Correlation is made with CT head 02/19/2021 FINDINGS: MRI HEAD FINDINGS Brain: Restricted diffusion with ADC correlate in the mesial left temporal lobe, measuring up to 4.3 x 2.2 x 2.2 cm (series 5, image 73 and series 7, image 48), which correlates with the hypodensity seen on the same-day CT. Additional scattered foci of restricted diffusion with ADC correlates in the left occipital lobe (series 5, image 75 and 76) as well as in the posterior left thalamus (series 5, image 77). No acute hemorrhage, mass, mass effect, or midline shift. Mildly increased T2 signal is associated with the previously mentioned areas of restricted diffusion, likely mild edema. No hydrocephalus or extra-axial collection. Vascular: Please see MRA findings below Skull and upper cervical spine: Normal marrow signal. Possible mild spinal canal stenosis C2-C3. Sinuses/Orbits: No acute or significant finding. Other: The mastoids are well aerated. MRA HEAD FINDINGS Anterior circulation: Both internal carotid arteries are patent to the termini, without stenosis or other abnormality. A1 segments patent, with mild focal narrowing in the mid right A1 (series 1, image 101). Normal anterior communicating artery. Anterior cerebral arteries are patent to their distal aspects. No M1 stenosis or occlusion. Normal MCA bifurcations. Distal MCA branches perfused and symmetric. Posterior circulation: Vertebral arteries patent to the vertebrobasilar junction without stenosis. Right dominant. Basilar patent to its distal aspect. Superior cerebellar arteries patent bilaterally. Patent short right P1, with prominent right posterior communicating artery. The right PCA is well perfused. Poor visualization of the left P1, which likely shares a common origin with the left SCA (series 1, image 71) poor opacification of  the P1 proximally (series 1, image 75-80), with patent left posterior communicating artery. Focal non opacification of the left P2 (series 1 images 85-89). Poor perfusion of the remainder of the left P2 (series 1, image 87), with nonvisualization of the left P3 segments. Anatomic variants: None significant MRA NECK FINDINGS Aortic arch: Three-vessel arch. No evidence of aneurysm, dissection, or significant proximal stenosis. Right carotid system: Patent, without significant stenosis, dissection, or occlusion. Left carotid system: Patent, without significant stenosis, dissection, or occlusion. Vertebral arteries: The right vertebral artery is visualized from its origin to the skull base. There is significant tortuosity in the midcervical spine (series 11, image 49). The left vertebral artery is diminutive and minimally visualized from just distal to its origin to the skull base, although it does appear patent. The origin of the left vertebral artery is not well evaluated, likely secondary to artifact. Other: None IMPRESSION: 1. Acute infarct in the posterior mesial temporal lobe, with additional scattered infarcts in the posterior left thalamus and left occipital lobe, consistent with left PCA territory infarct. 2. Poor visualization of the left PCA, with non opacification of the proximal left P2, as well as P3 segments. Poor opacification of the left P1 and more distal  P2 segment. No other significant intracranial stenosis. 3. Diminutive left vertebral artery, which is not dominant. This may be congenital and or may represent atherosclerotic stenosis. No other significant stenosis in the neck. Code stroke imaging results were communicated on 02/19/2021 at 6:38 pm to provider Dr. Lorrin Goodell via secure text paging. Electronically Signed   By: Merilyn Baba M.D.   On: 02/19/2021 18:39   ECHOCARDIOGRAM COMPLETE  Result Date: 02/20/2021    ECHOCARDIOGRAM REPORT   Patient Name:   DAELIN HASTE Date of Exam: 02/20/2021  Medical Rec #:  [id number]    Height:       68.0 in Accession #:    [id number]   Weight:       259.0 lb Date of Birth:  Jun 17, 1955     BSA:          2.281 m Patient Age:    51 years     BP:           162/79 mmHg Patient Gender: M            HR:           78 bpm. Exam Location:  Inpatient Procedure: 2D Echo, Cardiac Doppler and Color Doppler Indications:    Stroke  History:        Patient has no prior history of Echocardiogram examinations.                 Signs/Symptoms:Shortness of Breath and Dyspnea; Risk                 Factors:Hypertension, Diabetes and Dyslipidemia.  Sonographer:    Roseanna Rainbow RDCS Referring Phys: [id number] [facility]  Sonographer Comments: Technically difficult study due to poor echo windows and patient is morbidly obese. Image acquisition challenging due to patient body habitus. IMPRESSIONS  1. Moderate concentric LVH with proximal septal thickening.  2. Left ventricular ejection fraction, by estimation, is 70 to 75%. The left ventricle has hyperdynamic function. The left ventricle has no regional wall motion abnormalities. There is moderate concentric left ventricular hypertrophy. Left ventricular diastolic parameters are consistent with Grade I diastolic dysfunction (impaired relaxation). Elevated left atrial pressure.  3. Right ventricular systolic function is normal. The right ventricular size is normal.  4. The mitral valve is normal in structure. No evidence of mitral valve regurgitation. No evidence of mitral stenosis.  5. The aortic valve is tricuspid. Aortic valve regurgitation is not visualized. Aortic valve sclerosis/calcification is present, without any evidence of aortic stenosis.  6. The inferior vena cava is normal in size with greater than 50% respiratory variability, suggesting right atrial pressure of 3 mmHg. Comparison(s): No prior Echocardiogram. FINDINGS  Left Ventricle: Left ventricular ejection fraction, by estimation, is 70 to 75%. The left ventricle has hyperdynamic  function. The left ventricle has no regional wall motion abnormalities. The left ventricular internal cavity size was normal in size. There is moderate concentric left ventricular hypertrophy. Left ventricular diastolic parameters are consistent with Grade I diastolic dysfunction (impaired relaxation). Elevated left atrial pressure. Right Ventricle: The right ventricular size is normal. Right ventricular systolic function is normal. Left Atrium: Left atrial size was normal in size. Right Atrium: Right atrial size was normal in size. Pericardium: There is no evidence of pericardial effusion. Mitral Valve: The mitral valve is normal in structure. Mild mitral annular calcification. No evidence of mitral valve regurgitation. No evidence of mitral valve stenosis. Tricuspid Valve: The tricuspid valve is normal in structure. Tricuspid valve regurgitation is trivial.  No evidence of tricuspid stenosis. Aortic Valve: The aortic valve is tricuspid. Aortic valve regurgitation is not visualized. Aortic valve sclerosis/calcification is present, without any evidence of aortic stenosis. Aortic valve mean gradient measures 5.0 mmHg. Aortic valve peak gradient measures 8.8 mmHg. Aortic valve area, by VTI measures 3.69 cm. Pulmonic Valve: The pulmonic valve was normal in structure. Pulmonic valve regurgitation is not visualized. No evidence of pulmonic stenosis. Aorta: The aortic root is normal in size and structure. Venous: The inferior vena cava is normal in size with greater than 50% respiratory variability, suggesting right atrial pressure of 3 mmHg. IAS/Shunts: No atrial level shunt detected by color flow Doppler. Additional Comments: Moderate concentric LVH with proximal septal thickening.  LEFT VENTRICLE PLAX 2D LVIDd:         3.90 cm     Diastology LVIDs:         2.60 cm     LV e' medial:    4.54 cm/s LV PW:         1.30 cm     LV E/e' medial:  15.8 LV IVS:        1.90 cm     LV e' lateral:   4.54 cm/s LVOT diam:     2.50 cm      LV E/e' lateral: 15.8 LV SV:         106 LV SV Index:   46 LVOT Area:     4.91 cm  LV Volumes (MOD) LV vol d, MOD A2C: 79.4 ml LV vol d, MOD A4C: 59.1 ml LV vol s, MOD A2C: 28.8 ml LV vol s, MOD A4C: 22.1 ml LV SV MOD A2C:     50.6 ml LV SV MOD A4C:     59.1 ml LV SV MOD BP:      43.7 ml RIGHT VENTRICLE             IVC RV S prime:     12.60 cm/s  IVC diam: 2.20 cm TAPSE (M-mode): 2.0 cm LEFT ATRIUM             Index        RIGHT ATRIUM           Index LA diam:        4.00 cm 1.75 cm/m   RA Area:     13.10 cm LA Vol (A2C):   33.3 ml 14.60 ml/m  RA Volume:   27.20 ml  11.92 ml/m LA Vol (A4C):   55.1 ml 24.16 ml/m LA Biplane Vol: 46.2 ml 20.25 ml/m  AORTIC VALVE AV Area (Vmax):    3.62 cm AV Area (Vmean):   3.41 cm AV Area (VTI):     3.69 cm AV Vmax:           148.00 cm/s AV Vmean:          106.000 cm/s AV VTI:            0.286 m AV Peak Grad:      8.8 mmHg AV Mean Grad:      5.0 mmHg LVOT Vmax:         109.00 cm/s LVOT Vmean:        73.700 cm/s LVOT VTI:          0.215 m LVOT/AV VTI ratio: 0.75  AORTA Ao Root diam: 3.50 cm Ao Asc diam:  3.50 cm MITRAL VALVE MV Area (PHT): 3.08 cm     SHUNTS MV Decel Time: 246 msec  Systemic VTI:  0.22 m MV E velocity: 71.80 cm/s   Systemic Diam: 2.50 cm MV A velocity: 102.00 cm/s MV E/A ratio:  0.70 Kirk Ruths MD Electronically signed by Kirk Ruths MD Signature Date/Time: 02/20/2021/11:36:38 AM    Final     DISCHARGE EXAMINATION: Vitals:   02/24/21 2358 02/25/21 0350 02/25/21 0747 02/25/21 1129  BP: (!) 150/80 (!) 151/80 (!) 185/93 (!) 158/92  Pulse: 75 73 81 79  Resp: _0 Temp: 98 F (36.7 C) 98.4 F (36.9 C)  98.2 F (36.8 C)  TempSrc: Oral Oral  Oral  SpO2: 99% 100% 100% 99%    General appearance: Awake alert.  In no distress Resp: Clear to auscultation bilaterally.  Normal effort Cardio: S1-S2 is normal regular.  No S3-S4.  No rubs murmurs or bruit GI: Abdomen is soft.  Nontender nondistended.  Bowel sounds are present normal.   No masses organomegaly   DISPOSITION: Home  Discharge Instructions     Ambulatory referral to Neurology   Complete by: As directed    An appointment is requested in approximately: 8 weeks. For stroke f/u and cognitive evaluation.   Ambulatory referral to Occupational Therapy   Complete by: As directed    Ambulatory referral to Physical Therapy   Complete by: As directed    Ambulatory referral to Speech Therapy   Complete by: As directed    Call MD for:  difficulty breathing, headache or visual disturbances   Complete by: As directed    Call MD for:  extreme fatigue   Complete by: As directed    Call MD for:  persistant dizziness or light-headedness   Complete by: As directed    Call MD for:  persistant nausea and vomiting   Complete by: As directed    Call MD for:  severe uncontrolled pain   Complete by: As directed    Call MD for:  temperature >100.4   Complete by: As directed    Diet - low sodium heart healthy   Complete by: As directed    Diet Carb Modified   Complete by: As directed    Discharge instructions   Complete by: As directed    Please be sure to follow-up with your primary care provider next week to have blood work done and for posthospitalization follow-up.  Blood work will be required to check your kidney function.  Monitor your glucose levels at home using the glucometer which has been prescribed.  Seek attention if your levels are consistently above 200.  Also seek attention if you develop low glucose levels below 80.  Referral has been sent to neurology for follow-up regarding stroke.  You were cared for by a hospitalist during your hospital stay. If you have any questions about your discharge medications or the care you received while you were in the hospital after you are discharged, you can call the unit and asked to speak with the hospitalist on call if the hospitalist that took care of you is not available. Once you are discharged, your primary care  physician will handle any further medical issues. Please note that NO REFILLS for any discharge medications will be authorized once you are discharged, as it is imperative that you return to your primary care physician (or establish a relationship with a primary care physician if you do not have one) for your aftercare needs so that they can reassess your need for medications and monitor your lab values. If you do not have  a primary care physician, you can call [phone] for a physician referral.   Increase activity slowly   Complete by: As directed           Allergies as of 02/25/2021   No Known Allergies      Medication List     STOP taking these medications    [facility] Aspirin Adult Low Dose 81 MG chewable tablet Generic drug: aspirin Replaced by: aspirin EC 81 MG tablet   hydrochlorothiazide 25 MG tablet Commonly known as: HYDRODIURIL   Janumet 50-1000 MG tablet Generic drug: sitaGLIPtin-metformin   losartan 50 MG tablet Commonly known as: COZAAR   omeprazole 40 MG capsule Commonly known as: PRILOSEC       TAKE these medications    amLODipine 10 MG tablet Commonly known as: NORVASC Take 1 tablet (10 mg total) by mouth daily. Start taking on: February 26, 2021 What changed: when to take this   aspirin EC 81 MG tablet Take 1 tablet (81 mg total) by mouth daily. Swallow whole. Replaces: [facility] Aspirin Adult Low Dose 81 MG chewable tablet   atorvastatin 80 MG tablet Commonly known as: LIPITOR Take 1 tablet (80 mg total) by mouth daily. What changed:  medication strength how much to take   blood glucose meter kit and supplies Kit Dispense based on patient and insurance preference. Use up to four times daily as directed.   clopidogrel 75 MG tablet Commonly known as: PLAVIX Take 1 tablet (75 mg total) by mouth daily for 21 days.   glimepiride 2 MG tablet Commonly known as: AMARYL Take 1 tablet (2 mg total) by mouth daily with breakfast.   metoprolol succinate 25  MG 24 hr tablet Commonly known as: TOPROL-XL Take 1 tablet (25 mg total) by mouth daily. What changed: additional instructions               Durable Medical Equipment  (From admission, onward)           Start     Ordered   02/25/21 1056  For home use only DME Walker rolling  Once       Question Answer Comment  Walker: With [place]   Patient needs a walker to treat with the following condition Stroke [facility])      02/25/21 1055              Follow-up Information     [name], NP. Schedule an appointment as soon as possible for a visit in 1 week(s).   Specialty: Adult Health Nurse Practitioner Why: to do bloodwork: BMET. And for post hospitalization follow up. Contact information: [address] [place] [address] [place]. Schedule an appointment as soon as possible for a visit in 1 week(s).   Specialty: Rehabilitation Contact information: [place]. [id number] [place] [address] [phone]        Rex Kras, DO Follow up.   Specialties: Cardiology, Vascular Surgery Why: office will contact to arrange a 30 day heart monitor. Contact information: [place] [address] [phone]                 TOTAL DISCHARGE TIME: 48 minutes  [facility] Pager on [email or web address]  02/25/2021, 12:01 PM

## 2021-02-27 ENCOUNTER — Telehealth: Payer: Self-pay

## 2021-02-27 NOTE — Telephone Encounter (Signed)
Transition Care Management Follow-up Telephone Call Date of discharge and from where: 02/25/2021   [facility]  How have you been since you were released from the hospital? Good  Any questions or concerns? No  Items Reviewed: Did the pt receive and understand the discharge instructions provided? Yes  Medications obtained and verified? Yes  Other?  N/A Any new allergies since your discharge? No  Dietary orders reviewed? Yes Do you have support at home? Yes   Home Care and Equipment/Supplies: Were home health services ordered? no If so, what is the name of the agency? N/a  Has the agency set up a time to come to the patient's home? not applicable Were any new equipment or medical supplies ordered?  No What is the name of the medical supply agency? N/a Were you able to get the supplies/equipment? not applicable Do you have any questions related to the use of the equipment or supplies? No  Functional Questionnaire: (I = Independent and D = Dependent) ADLs: I  Bathing/Dressing- i  Meal Prep- i  Eating- i  Maintaining continence- i  Transferring/Ambulation- i  Managing Meds- D  Follow up appointments reviewed:  PCP Hospital f/u appt confirmed? Yes  Scheduled to see [name] ,NP on 03/05/2021 @ 0910am . [facility] f/u appt confirmed? Yes  Scheduled to see Sunit Tolia on 02-10/2021 @ 1100am. Are transportation arrangements needed? No  If their condition worsens, is the pt aware to call PCP or go to the Emergency Dept.? Yes Was the patient provided with contact information for the PCP's office or ED? Yes Was to pt encouraged to call back with questions or concerns? Yes

## 2021-03-04 ENCOUNTER — Ambulatory Visit: Payer: Medicare Other | Attending: Internal Medicine | Admitting: Physical Therapy

## 2021-03-04 ENCOUNTER — Other Ambulatory Visit: Payer: Self-pay

## 2021-03-04 ENCOUNTER — Encounter: Payer: Self-pay | Admitting: Physical Therapy

## 2021-03-04 ENCOUNTER — Encounter: Payer: Self-pay | Admitting: Speech Pathology

## 2021-03-04 ENCOUNTER — Ambulatory Visit: Payer: Medicare Other | Admitting: Speech Pathology

## 2021-03-04 ENCOUNTER — Ambulatory Visit: Payer: Medicare Other | Admitting: Occupational Therapy

## 2021-03-04 DIAGNOSIS — R41841 Cognitive communication deficit: Secondary | ICD-10-CM | POA: Insufficient documentation

## 2021-03-04 DIAGNOSIS — R2681 Unsteadiness on feet: Secondary | ICD-10-CM | POA: Diagnosis not present

## 2021-03-04 DIAGNOSIS — R278 Other lack of coordination: Secondary | ICD-10-CM | POA: Insufficient documentation

## 2021-03-04 DIAGNOSIS — R41842 Visuospatial deficit: Secondary | ICD-10-CM

## 2021-03-04 DIAGNOSIS — R41840 Attention and concentration deficit: Secondary | ICD-10-CM | POA: Insufficient documentation

## 2021-03-04 DIAGNOSIS — R262 Difficulty in walking, not elsewhere classified: Secondary | ICD-10-CM | POA: Diagnosis not present

## 2021-03-04 DIAGNOSIS — M6281 Muscle weakness (generalized): Secondary | ICD-10-CM | POA: Diagnosis not present

## 2021-03-04 DIAGNOSIS — R29818 Other symptoms and signs involving the nervous system: Secondary | ICD-10-CM | POA: Diagnosis not present

## 2021-03-04 DIAGNOSIS — I639 Cerebral infarction, unspecified: Secondary | ICD-10-CM | POA: Insufficient documentation

## 2021-03-04 DIAGNOSIS — I69351 Hemiplegia and hemiparesis following cerebral infarction affecting right dominant side: Secondary | ICD-10-CM | POA: Diagnosis not present

## 2021-03-04 NOTE — Therapy (Signed)
[place]. [place], [place], [address] Phone: [phone]   Fax:  [phone]  Speech Language Pathology Evaluation  Patient Details  Name: Joe Stewart MRN: [id number] Date of Birth: 1955-03-25 Referring Provider (SLP): Bonnielee Haff, MD   Encounter Date: 03/04/2021   End of Session - 03/04/21 1232     Visit Number 1    Number of Visits 17    Date for SLP Re-Evaluation 06/01/21    SLP Start Time 0930    SLP Stop Time  1010    SLP Time Calculation (min) 40 min             Past Medical History:  Diagnosis Date   Arthritis    Cataract    Mixed OU   Diabetes mellitus without complication ([place])    Diabetic retinopathy ([place])    NPDR OU   Hyperlipidemia    Hypertension    Hypertensive retinopathy    OU   Obesity     Past Surgical History:  Procedure Laterality Date   BIOPSY  11/29/2019   Procedure: BIOPSY;  Surgeon: Irving Copas., MD;  Location: [facility];  Service: Gastroenterology;;   COLONOSCOPY WITH PROPOFOL N/A 11/29/2019   Procedure: COLONOSCOPY WITH PROPOFOL;  Surgeon: Irving Copas., MD;  Location: [facility];  Service: Gastroenterology;  Laterality: N/A;   ENDOSCOPIC MUCOSAL RESECTION N/A 11/29/2019   Procedure: ENDOSCOPIC MUCOSAL RESECTION;  Surgeon: Rush Landmark Telford Nab., MD;  Location: [facility];  Service: Gastroenterology;  Laterality: N/A;   ESOPHAGOGASTRODUODENOSCOPY (EGD) WITH PROPOFOL N/A 11/29/2019   Procedure: ESOPHAGOGASTRODUODENOSCOPY (EGD) WITH PROPOFOL;  Surgeon: Rush Landmark Telford Nab., MD;  Location: [facility];  Service: Gastroenterology;  Laterality: N/A;   EYE SURGERY Left    had to have eye flushed after getting costic sodium in L eye   HEMOSTASIS CLIP PLACEMENT  11/29/2019   Procedure: HEMOSTASIS CLIP PLACEMENT;  Surgeon: Irving Copas., MD;  Location: [facility];  Service: Gastroenterology;;   HERNIA REPAIR     HERNIA REPAIR      35 years ago   HOT HEMOSTASIS N/A 11/29/2019   Procedure: HOT HEMOSTASIS (ARGON PLASMA COAGULATION/BICAP);  Surgeon: Irving Copas., MD;  Location: [facility];  Service: Gastroenterology;  Laterality: N/A;   LEFT HEART CATH AND CORONARY ANGIOGRAPHY N/A 04/11/2019   Procedure: LEFT HEART CATH AND CORONARY ANGIOGRAPHY;  Surgeon: Adrian Prows, MD;  Location: [facility];  Service: Cardiovascular;  Laterality: N/A;   POLYPECTOMY  11/29/2019   Procedure: POLYPECTOMY;  Surgeon: Rush Landmark Telford Nab., MD;  Location: [facility];  Service: Gastroenterology;;   RENAL ANGIOGRAPHY Bilateral 04/11/2019   Procedure: RENAL ANGIOGRAPHY;  Surgeon: Adrian Prows, MD;  Location: [facility];  Service: Cardiovascular;  Laterality: Bilateral;   SUBMUCOSAL LIFTING INJECTION  11/29/2019   Procedure: SUBMUCOSAL LIFTING INJECTION;  Surgeon: Irving Copas., MD;  Location: [facility];  Service: Gastroenterology;;   TOTAL KNEE ARTHROPLASTY Left 08/01/2019   Procedure: LEFT TOTAL KNEE ARTHROPLASTY;  Surgeon: Meredith Pel, MD;  Location: [facility];  Service: Orthopedics;  Laterality: Left;    There were no vitals filed for this visit.   Subjective Assessment - 03/04/21 0934     Subjective Pt was pleasant and cooperative throughout evaluation.    Currently in Pain? No/denies                SLP Evaluation [facility] - 03/04/21 7342       SLP Visit Information   SLP  Received On 03/04/21    Referring Provider (SLP) Bonnielee Haff, MD    Onset Date 02/19/21    Medical Diagnosis CVA      Subjective   Patient/Family Stated Goal To become more independent      General Information   HPI Pt is a 66 y.o. male who presented with 2-day history of left temporal headache along with right-sided blurred vision and word retrieval difficulty. Pt also reported to MD that he "feels that his speech seems like he is talking out of a tunnel." MRI brain: Acute infarct in the posterior mesial  temporal lobe, with additional scattered infarcts in the posterior left thalamus and  left occipital lobe, consistent with left PCA territory infarct. PMH: diabetes, hypertension, hyperlipidemia, obesity.      Balance Screen   Has the patient fallen in the past 6 months No    Has the patient had a decrease in activity level because of a fear of falling?  No    Is the patient reluctant to leave their home because of a fear of falling?  No      Prior Functional Status   Cognitive/Linguistic Baseline Within functional limits    Type of Home House     Lives With Alone    Education <HS    Vocation Retired   Retired Consulting civil engineer   Overall Cognitive Status Impaired/Different from baseline    Area of Impairment Attention;Memory;Awareness    Current Attention Level Alternating    Memory Decreased short-term memory    Awareness Emergent    Executive Function Organizing      Auditory Comprehension   Overall Auditory Comprehension Impaired    Conversation Complex    Other Conversation Comments Requires repetition of information and reports that it "takes a while" to process auditory information.    Interfering Components Processing speed;Working Armed forces training and education officer Expression   Overall Verbal Expression Appears within functional limits for tasks assessed      Written Expression   Dominant Hand Right      Oral Motor/Sensory Function   Overall Oral Motor/Sensory Function Appears within functional limits for tasks assessed      Standardized Assessments   Standardized Assessments  Cognitive Linguistic Quick Test                   SLP Education - 03/04/21 0934     Education Details Cognitive-Communication impairment    Person(s) Educated Patient;Caregiver(s)    Methods Explanation    Comprehension Verbalized understanding              SLP Short Term Goals - 03/04/21 1248       SLP SHORT TERM  GOAL #1   Title Pt will increase auditory comprehension by recalling and verbalizing strategies to assist with active listening during conversation with minA.    Time 4    Period Weeks    Status New    Target Date 04/01/21      SLP SHORT TERM GOAL #2   Title Pt will comprehend functional memory or visual aids for recall of important information during structured conversations with minA.    Time 4    Period Weeks    Status New    Target Date 04/01/21      SLP SHORT TERM GOAL #3   Title Pt will complete BNT naming test to identify anomia.    Time 3  Period Weeks    Status New    Target Date 03/25/21              SLP Long Term Goals - 03/04/21 1249       SLP LONG TERM GOAL #1   Title Pt will increase auditory comprehension by recalling and verbalizing strategies to assist with active listening during  structured conversation independently.    Time 8    Period Weeks    Status New    Target Date 04/29/21      SLP LONG TERM GOAL #2   Title Pt will comprehend functional memory or visual aids for recall of important information during structured conversation independently    Time 8    Period Weeks    Status New    Target Date 04/29/21              Plan - 03/04/21 1232     Clinical Impression Statement Pt is a 66 yo male who presents to OP ST for evaluation post admittance into hospital due CVA. Pt was accompanied by brother for evaluation due to patient having difficulty with recalling information. Throughout evaluation, pt required repetition of information and reports, "I have to think harder about what I am saying". Pt endorses memory deficits and difficulty recalling information in conversations. He also reports occasional word finding deficits and paraphasias ("saying the wrong word from what I mean"). Prior to CVA, pt was living alone. He is now living with brother who is responsible for medication management and bills. SLP assessed pt using the CLQT to determine  extent of cognitive deficits. Pt scored the following severity levels: Attention - WFL; Memory - moderate; Executive Functions - WFL; Language - mild; Visuospatial skills - WFL; Clock Drawing - [facility]. Overall, pt scored a 3.4 indicating a   "mild" severity level (range: 3.4-2.5). SLP rec skilled speech services to address active listening through attention and memory strategies to increase indpendence at home. Pt in agreement.    Speech Therapy Frequency 2x / week    Duration 8 weeks    Treatment/Interventions Language facilitation;Environmental controls;Cueing hierarchy;SLP instruction and feedback;Cognitive reorganization;Functional tasks;Compensatory strategies;Internal/external aids;Multimodal communcation approach;Patient/family education    Potential to Achieve Goals Good    Consulted and Agree with Plan of Care Patient;Family member/caregiver    Family Member Consulted Brother             Patient will benefit from skilled therapeutic intervention in order to improve the following deficits and impairments:   Cognitive communication deficit    Problem List Patient Active Problem List   Diagnosis Date Noted   AKI (acute kidney injury) ([place]) 02/24/2021   Acute CVA (cerebrovascular accident) ([place]) 02/19/2021   Hyperlipidemia 02/19/2021   Anemia 10/12/2019   Cecal polyp 09/30/2019   History of colon polyps 09/30/2019   Abnormal colonoscopy 09/30/2019   Arthritis of left knee 08/01/2019   Type 2 diabetes mellitus without complication, without long-term current use of insulin ([place]) 05/01/2019   Dyspnea on exertion 04/10/2019   Abnormal nuclear stress test 04/10/2019   Essential hypertension 02/27/2019   Acute pain of left knee 02/27/2019    Verdene Lennert, CCC-SLP 03/04/2021, 12:51 PM  [place]. [place], [place], [address] Phone: [phone]   Fax:  [phone]  Name: Joe Stewart MRN: [id number] Date of  Birth: 03/03/1955

## 2021-03-04 NOTE — Therapy (Signed)
[place] [place], [place], [address] Phone: [phone]   Fax:  [phone]  Physical Therapy Evaluation  Patient Details  Name: Joe Stewart MRN: [id number] Date of Birth: 1955-11-26 No data recorded  Encounter Date: 03/04/2021   PT End of Session - 03/04/21 0932     Visit Number 1    Date for PT Re-Evaluation 06/01/21    Authorization Type [employer]    PT Start Time 0845    PT Stop Time 0927    PT Time Calculation (min) 42 min    Activity Tolerance Patient tolerated treatment well    Behavior During Therapy [facility] for tasks assessed/performed             Past Medical History:  Diagnosis Date   Arthritis    Cataract    Mixed OU   Diabetes mellitus without complication ([place])    Diabetic retinopathy ([place])    NPDR OU   Hyperlipidemia    Hypertension    Hypertensive retinopathy    OU   Obesity     Past Surgical History:  Procedure Laterality Date   BIOPSY  11/29/2019   Procedure: BIOPSY;  Surgeon: Irving Copas., MD;  Location: [facility];  Service: Gastroenterology;;   COLONOSCOPY WITH PROPOFOL N/A 11/29/2019   Procedure: COLONOSCOPY WITH PROPOFOL;  Surgeon: Irving Copas., MD;  Location: [facility];  Service: Gastroenterology;  Laterality: N/A;   ENDOSCOPIC MUCOSAL RESECTION N/A 11/29/2019   Procedure: ENDOSCOPIC MUCOSAL RESECTION;  Surgeon: Rush Landmark Telford Nab., MD;  [facility]: [facility];  Service: Gastroenterology;  Laterality: N/A;   ESOPHAGOGASTRODUODENOSCOPY (EGD) WITH PROPOFOL N/A 11/29/2019   Procedure: ESOPHAGOGASTRODUODENOSCOPY (EGD) WITH PROPOFOL;  Surgeon: Rush Landmark Telford Nab., MD;  [facility];  Service: Gastroenterology;  Laterality: N/A;   EYE SURGERY Left    had to have eye flushed after getting costic sodium in L eye   HEMOSTASIS CLIP PLACEMENT  11/29/2019   Procedure: HEMOSTASIS CLIP PLACEMENT;  Surgeon: Irving Copas., MD;  Location:  [facility];  Service: Gastroenterology;;   HERNIA REPAIR     HERNIA REPAIR     35 years ago   HOT HEMOSTASIS N/A 11/29/2019   Procedure: HOT HEMOSTASIS (ARGON PLASMA COAGULATION/BICAP);  Surgeon: Irving Copas., MD;  Location: [facility];  Service: Gastroenterology;  Laterality: N/A;   LEFT HEART CATH AND CORONARY ANGIOGRAPHY N/A 04/11/2019   Procedure: LEFT HEART CATH AND CORONARY ANGIOGRAPHY;  Surgeon: Adrian Prows, MD;  Location: [place] [facility];  Service: Cardiovascular;  Laterality: N/A;   POLYPECTOMY  11/29/2019   Procedure: POLYPECTOMY;  Surgeon: Rush Landmark Telford Nab., MD;  Location: [facility];  Service: Gastroenterology;;   RENAL ANGIOGRAPHY Bilateral 04/11/2019   Procedure: RENAL ANGIOGRAPHY;  Surgeon: Adrian Prows, MD;  Location: [place] [facility];  Service: Cardiovascular;  Laterality: Bilateral;   SUBMUCOSAL LIFTING INJECTION  11/29/2019   Procedure: SUBMUCOSAL LIFTING INJECTION;  Surgeon: Irving Copas., MD;  Location: [facility];  Service: Gastroenterology;;   TOTAL KNEE ARTHROPLASTY Left 08/01/2019   Procedure: LEFT TOTAL KNEE ARTHROPLASTY;  Surgeon: Meredith Pel, MD;  Location: [facility];  Service: Orthopedics;  Laterality: Left;    There were no vitals filed for this visit.    Subjective Assessment - 03/04/21 0849     Subjective Patient had lightheadedness and some difficulty with remembering for a bout a week, went to the ED and was admitted for a CVA on 02/19/21.  Was hospitalized for 7 days.  He is staying with his brother right now just to assure safety.  He lives alone typically.    Patient Stated Goals walk better, be more independent, have better balance, drive    Currently in Pain? No/denies                [facility] PT Assessment - 03/04/21 0001       Assessment   Medical Diagnosis CVA    Onset Date/Surgical Date 02/19/21    Hand Dominance Right    Prior Therapy in hospital      Balance Screen   Has the patient fallen in the  past 6 months No    Has the patient had a decrease in activity level because of a fear of falling?  No    Is the patient reluctant to leave their home because of a fear of falling?  No      Home Environment   Additional Comments no stairs, he would be doing housework      Prior Function   Level of Independence Independent    Vocation Retired    Leisure fishing      ROM / Strength   AROM / PROM / Strength AROM;Strength      AROM   Overall AROM Comments hips, knees and ankles WFL's but poorer quality of motion in the right LE      Strength   Overall Strength Comments left WFL's    Strength Assessment Site Hip;Knee;Ankle    Right/Left Hip Right    Right Hip Flexion 4-/5    Right Hip Extension 4-/5    Right/Left Knee Right    Right Knee Flexion 4-/5    Right Knee Extension 4-/5    Right/Left Ankle Right    Right Ankle Dorsiflexion 4-/5    Right Ankle Plantar Flexion 4-/5      Ambulation/Gait   Gait Comments no device, unsteady with turns, right LE has some ataxic motions      Standardized Balance Assessment   Standardized Balance Assessment Berg Balance Test;Dynamic Gait Index      Berg Balance Test   Sit to Stand Able to stand without using hands and stabilize independently    Standing Unsupported Able to stand safely 2 minutes    Sitting with Back Unsupported but Feet Supported on Floor or Stool Able to sit safely and securely 2 minutes    Stand to Sit Sits safely with minimal use of hands    Transfers Able to transfer safely, minor use of hands    Standing Unsupported with Eyes Closed Able to stand 10 seconds with supervision    Standing Unsupported with Feet Together Able to place feet together independently and stand for 1 minute with supervision    From Standing, Reach Forward with Outstretched Arm Can reach forward >12 cm safely (5")    From Standing Position, Pick up Object from Floor Able to pick up shoe, needs supervision    From Standing Position, Turn to Look  Behind Over each Shoulder Turn sideways only but maintains balance    Turn 360 Degrees Able to turn 360 degrees safely but slowly    Standing Unsupported, Alternately Place Feet on Step/Stool Able to stand independently and complete 8 steps >20 seconds    Standing Unsupported, One Foot in Front Able to take small step independently and hold 30 seconds    Standing on One Leg Tries to lift leg/unable to hold 3 seconds but remains standing independently    Total  Score 42      Dynamic Gait Index   Level Surface Mild Impairment    Change in Gait Speed Mild Impairment    Gait with Horizontal Head Turns Mild Impairment    Gait with Vertical Head Turns Moderate Impairment    Gait and Pivot Turn Moderate Impairment    Step Over Obstacle Moderate Impairment    Step Around Obstacles Mild Impairment    Steps Mild Impairment    Total Score 13                        Objective measurements completed on examination: See above findings.                  PT Short Term Goals - 03/04/21 0953       PT SHORT TERM GOAL #1   Title independent with initial HEP    Time 2    Period Weeks    Status New               PT Long Term Goals - 03/04/21 0953       PT LONG TERM GOAL #1   Title Pt will be I and compliant with HEP.    Time 12    Period Weeks    Status New      PT LONG TERM GOAL #2   Title Pt will improve right hip/knee strength to 5/5 MMT tested in sitting to improve overall funciton    Time 12    Period Weeks    Status New      PT LONG TERM GOAL #3   Title improve berg balance score to 47/56    Time 12    Period Weeks    Status New      PT LONG TERM GOAL #4   Title increase DGI score to 19    Baseline 13    Time 12    Period Weeks    Status New                    Plan - 03/04/21 0932     Clinical Impression Statement Patient had a CVA with some right sided weakness.  He had a 7 day hospital stay, He lives alone, currently staying  with his brother until they feel he is safe.  He reports difficulty walking and some right sided weakness.  He has difficulty with lifting the right foot up into the pedal of the nustep, had to use his hands.  Has ataxic movements of the right LE with advancement while walking, DGI and Berg balance scores put him at a higher risk for falls.    Stability/Clinical Decision Making Evolving/Moderate complexity    Clinical Decision Making Low    Rehab Potential Good    PT Frequency 2x / week    PT Duration 12 weeks    PT Treatment/Interventions ADLs/Self Care Home Management;Gait training;Neuromuscular re-education;Balance training;Therapeutic exercise;Therapeutic activities;Functional mobility training;Stair training;Patient/family education;Manual techniques    PT Next Visit Plan start gym and balance activities    Consulted and Agree with Plan of Care Patient             Patient will benefit from skilled therapeutic intervention in order to improve the following deficits and impairments:  Abnormal gait, Decreased coordination, Decreased range of motion, Difficulty walking, Dizziness, Decreased activity tolerance, Decreased balance, Decreased strength, Decreased mobility  Visit Diagnosis: Muscle weakness (generalized) - Plan: PT plan of  care cert/re-cert  Difficulty in walking, not elsewhere classified - Plan: PT plan of care cert/re-cert  Unsteadiness on feet - Plan: PT plan of care cert/re-cert     Problem List Patient Active Problem List   Diagnosis Date Noted   AKI (acute kidney injury) ([place]) 02/24/2021   Acute CVA (cerebrovascular accident) ([place]) 02/19/2021   Hyperlipidemia 02/19/2021   Anemia 10/12/2019   Cecal polyp 09/30/2019   History of colon polyps 09/30/2019   Abnormal colonoscopy 09/30/2019   Arthritis of left knee 08/01/2019   Type 2 diabetes mellitus without complication, without long-term current use of insulin ([place]) 05/01/2019   Dyspnea on exertion 04/10/2019    Abnormal nuclear stress test 04/10/2019   Essential hypertension 02/27/2019   Acute pain of left knee 02/27/2019    Sumner Boast, PT 03/04/2021, 9:58 AM  [place]. [place], [place], [address] Phone: [phone]   Fax:  [phone]  Name: Joe Stewart MRN: [id number] Date of Birth: 1955-03-25

## 2021-03-04 NOTE — Patient Instructions (Signed)
Access Code: [email or web address] URL: [email or web address] Date: 03/04/2021 Prepared by: Lum Babe  Exercises Seated Long Arc Quad - 1 x daily - 7 x weekly - 3 sets - 10 reps - 3 hold Seated March - 1 x daily - 7 x weekly - 3 sets - 10 reps - 3 hold Seated Ankle Dorsiflexion AROM - 1 x daily - 7 x weekly - 3 sets - 10 reps - 3 hold Seated Heel Raise - 1 x daily - 7 x weekly - 3 sets - 10 reps - 3 hold Standing Hip Abduction - 1 x daily - 7 x weekly - 3 sets - 10 reps - 3 hold

## 2021-03-04 NOTE — Therapy (Signed)
[place]. [place], [place], [address] Phone: [phone]   Fax:  [phone]  Occupational Therapy Evaluation  Patient Details  Name: Joe Stewart MRN: [id number] Date of Birth: [birth year] Referring Provider (OT): Bonnielee Haff, MD   Encounter Date: 03/04/2021   OT End of Session - 03/04/21 0931     Visit Number 1    Number of Visits 17    Date for OT Re-Evaluation 05/27/21    Authorization Type [employer]    OT Start Time 0800    OT Stop Time 0847    OT Time Calculation (min) 47 min    Activity Tolerance Patient tolerated treatment well    Behavior During Therapy [facility] for tasks assessed/performed            Past Medical History:  Diagnosis Date   Arthritis    Cataract    Mixed OU   Diabetes mellitus without complication ([place])    Diabetic retinopathy ([place])    NPDR OU   Hyperlipidemia    Hypertension    Hypertensive retinopathy    OU   Obesity     Past Surgical History:  Procedure Laterality Date   BIOPSY  11/29/2019   Procedure: BIOPSY;  Surgeon: Irving Copas., MD;  Location: [facility];  Service: Gastroenterology;;   COLONOSCOPY WITH PROPOFOL N/A 11/29/2019   Procedure: COLONOSCOPY WITH PROPOFOL;  Surgeon: Irving Copas., MD;  Location: [facility];  Service: Gastroenterology;  Laterality: N/A;   ENDOSCOPIC MUCOSAL RESECTION N/A 11/29/2019   Procedure: ENDOSCOPIC MUCOSAL RESECTION;  Surgeon: Rush Landmark Telford Nab., MD;  [facility];  Service: Gastroenterology;  Laterality: N/A;   ESOPHAGOGASTRODUODENOSCOPY (EGD) WITH PROPOFOL N/A 11/29/2019   Procedure: ESOPHAGOGASTRODUODENOSCOPY (EGD) WITH PROPOFOL;  Surgeon: Rush Landmark Telford Nab., MD;  [facility];  Service: Gastroenterology;  Laterality: N/A;   EYE SURGERY Left    had to have eye flushed after getting costic sodium in L eye   HEMOSTASIS CLIP PLACEMENT  11/29/2019   Procedure:  HEMOSTASIS CLIP PLACEMENT;  Surgeon: Irving Copas., MD;  [facility];  Service: Gastroenterology;;   HERNIA REPAIR     HERNIA REPAIR     35 years ago   HOT HEMOSTASIS N/A 11/29/2019   Procedure: HOT HEMOSTASIS (ARGON PLASMA COAGULATION/BICAP);  Surgeon: Irving Copas., MD;  Location: [facility];  Service: Gastroenterology;  Laterality: N/A;   LEFT HEART CATH AND CORONARY ANGIOGRAPHY N/A 04/11/2019   Procedure: LEFT HEART CATH AND CORONARY ANGIOGRAPHY;  Surgeon: Adrian Prows, MD;  Location: [place] [facility];  Service: Cardiovascular;  Laterality: N/A;   POLYPECTOMY  11/29/2019   Procedure: POLYPECTOMY;  Surgeon: Rush Landmark Telford Nab., MD;  Location: [facility];  Service: Gastroenterology;;   RENAL ANGIOGRAPHY Bilateral 04/11/2019   Procedure: RENAL ANGIOGRAPHY;  Surgeon: Adrian Prows, MD;  Location: [place] [facility];  Service: Cardiovascular;  Laterality: Bilateral;   SUBMUCOSAL LIFTING INJECTION  11/29/2019   Procedure: SUBMUCOSAL LIFTING INJECTION;  Surgeon: Irving Copas., MD;  Location: [facility];  Service: Gastroenterology;;   TOTAL KNEE ARTHROPLASTY Left 08/01/2019   Procedure: LEFT TOTAL KNEE ARTHROPLASTY;  Surgeon: Meredith Pel, MD;  Location: [place];  Service: Orthopedics;  Laterality: Left;    There were no vitals filed for this visit.   Subjective Assessment - 03/04/21 0819     Subjective  Pt arrives to OT evaluation session today w/ concerns of persistent visual deficits, unsteadiness on his feet/balance impairment, and difficulties w/  short-term memory. History supplied by pt w/ assist from his brother, who he is currently staying with and who is helping him to manage his medications and MD visits. Pt states he presented to the ED when he noticed tingling on the L side of his head and difficulty w/ depth perception x2 days; "I would reach for somthing and it wouldn't actually be where I was reaching." Per pt's brother, he was a  canditate for IP rehab, but insurance was "dragging their feet" and pt ended up d/c home from hospital on 02/25/21.    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    Referring Provider (OT) Bonnielee Haff, MD    Onset Date/Surgical Date 02/19/21    Hand Dominance Right    Next MD Visit 03/05/21   PCP   Prior Therapy Acute OT/PT/ST      Precautions   Precautions None      Balance Screen   Has the patient fallen in the past 6 months No      [place] One level   Currently (hopefully temporarily) staying w/ his brother in a 2-level home   Bathroom Shower/Tub Tub/Shower unit    Prospect seat;Grab bars - toilet;Grab bars - tub/shower;Hand held shower head;Walker - 2 wheels   Both at his private residance as well as where pt is staying w/ his brother   Lives With Alone   Currently staying w/ family     Prior Function   Level of Independence Independent    Vocation Retired    Leisure video games, fishing, spending time w/ friends and family      ADL   Eating/Feeding Modified independent    Grooming Modified independent    Upper Body Bathing Modified independent    Lower Body Bathing Modified independent    Upper Body Dressing Increased time    Lower Body Dressing Increased time    Toileting -  Cytogeneticist Modified independent   Extended time; use of AE/DME for safety     IADL   Prior Level of Function Meal Prep Independent    Meal Prep Able to complete simple cold meal and snack prep    Prior Level of Function Community Mobility Driving independently    [place] on family or friends for transportation   Encouraged pt to discuss potential for return to driving w/ MD prior to any attempts   Prior Level of Function Medication Managment Was not taking medication as advised prior to CVA    Medication Management Has difficulty remembering to take medication      Mobility   Mobility Status Comments Reports dizziness and unsteadiness on his feet      Written Expression  Dominant Hand Right    Written Experience Not tested      Vision - History   Baseline Vision Wears glasses only for reading    Visual History Retinopathy   Reports he was receiving injections bilaterally up until about 1 year ago   Patient Visual Report Nausea/blurring vision with head movement;Peripheral vision impairment;Unable to keep objects in focus      Vision Assessment   Tracking/Visual Pursuits Able to track stimulus in all quads without difficulty    Saccades Within functional limits    Convergence Within functional limits    Comment Decreasing reading acuity per gross assessment. R eye more clear than L per pt report. Visual fields to be assessed.      Cognition   Overall Cognitive Status Impaired/Different from baseline    Area of Impairment Memory;Awareness      Sensation   Additional Comments No deficits per pt report      Coordination   Gross Motor Movements are Fluid and Coordinated Yes    Fine Motor Movements are Fluid and Coordinated Yes    Finger Nose Finger Test [facility]    9 Hole Peg Test Right;Left    Right 9 Hole Peg Test 44 sec    Left 9 Hole Peg Test 36 sec    Other Decreased smoothness of lines observed during symbol cancellation activity       ROM / Strength   AROM / PROM / Strength AROM      AROM   Overall AROM  Within functional limits for tasks performed    AROM Assessment Site Shoulder;Elbow;Forearm;Wrist             OT Education - 03/04/21 0925     Education Details Education provided on role and purpose of OT, as well as potential interventions and goals for therapy based on initial evaluation findings.    Person(s) Educated Patient;Other (comment)   Brother   Methods Explanation    Comprehension Verbalized understanding             OT Short Term Goals - 03/04/21 0855       OT SHORT TERM GOAL #1   Title Pt will verbalize understanding of visual compensatory strategies to incorporate during tabletop/near visual acuity activities    Baseline No visual compensatory strategies    Time 2    Period Weeks    Status New    Target Date 03/18/21      OT SHORT TERM GOAL #2   Title Pt will be able to complete symbol cancellation task, using AE/compensatory strategies prn w/ 100% accuracy    Baseline Completed 50% of page w/ 14/15 correct    Time 4    Period Weeks    Status New    Target Date 04/01/21             OT Long Term Goals - 03/04/21 0858       OT LONG TERM GOAL #1   Title Pt will demonstrate improved participation in functional FM tasks as evidenced by decreasing 9-HPT time by at least 5 seconds w/ R, dominant hand    Baseline 44 sec (L hand 36 sec)    Time 8    Period Weeks    Status New    Target Date 04/29/21      OT LONG TERM GOAL #2   Title Pt will be able to sort medications (simulated or actual) correctly, using AE/compensatory strategies prn    Baseline Not currently  managing medication    Time 8    Period Weeks    Status New    Target Date 04/29/21      OT LONG TERM GOAL #3   Title Pt will be able to complete simulated meal prep activity w/ Mod I    Baseline Not currently participating in meal prep    Time 8    Period Weeks    Status New    Target Date 04/29/21       OT LONG TERM GOAL #4   Title Handwriting goal -- TBD    Baseline Not tested    Time 8    Period Weeks    Status New    Target Date 04/29/21      OT LONG TERM GOAL #5   Title TMT: Part B goal for potential return to driving -- TBD    Baseline Not tested    Time 8    Period Weeks    Status New    Target Date 04/29/21             Plan - 03/04/21 1259     Clinical Impression Statement Pt is a 66 y/o male who presents to OP OT due to limitations w/ vision and visual perception, [place], and balance s/p 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 PMH significant for DMT2, moderate non-proliferative diabetic retinopathy (was receiving injections up until about 1 year ago), HTN, HLD, and L TKA (08/01/19). Pt currently lives with his brother in a multi-level home temporarily, but would like to return to living alone and is retired. Pt will benefit from skilled occupational therapy services to address coordination, balance, [place], visual perception, and introduction of compensatory strategies/AE prn to improve participation and safety during ADLs and facilitate safe return to living independently.    OT Occupational Profile and History Detailed Assessment- Review of Records and additional review of physical, cognitive, psychosocial history related to current functional performance    Occupational performance deficits (Please refer to evaluation for details): ADL's;IADL's;Leisure;Social Participation    Body Structure / Function / Physical Skills ADL;Decreased knowledge of use of DME;Strength;Balance;Dexterity;GMC;UE functional use;IADL;Vision;Coordination;FMC;Decreased knowledge of precautions    Cognitive Skills Memory;Safety Awareness    Psychosocial Skills Environmental  Adaptations    Rehab Potential Good    Clinical Decision Making Limited treatment options, no task modification necessary    Comorbidities Affecting Occupational Performance: May have  comorbidities impacting occupational performance    Modification or Assistance to Complete Evaluation  No modification of tasks or assist necessary to complete eval    OT Frequency 2x / week    OT Duration 8 weeks    OT Treatment/Interventions Self-care/ADL training;DME and/or AE instruction;Balance training;Therapeutic activities;Therapeutic exercise;Visual/perceptual remediation/compensation;Neuromuscular education;Energy conservation;Manual Therapy;Patient/family education;Cognitive remediation/compensation    Plan Assess handwriting (LTG4) and Trail Making (LTG5); introduce visual compensatory strategies    Recommended Other Services Currently receiving PT/ST services at this location    Consulted and Agree with Plan of Care Patient            Patient will benefit from skilled therapeutic intervention in order to improve the following deficits and impairments:   Body Structure / Function / Physical Skills: ADL, Decreased knowledge of use of DME, Strength, Balance, Dexterity, GMC, UE functional use, IADL, Vision, Coordination, FMC, Decreased knowledge of precautions Cognitive Skills: Memory, Safety Awareness Psychosocial Skills: Environmental  Adaptations   Visit Diagnosis: Visuospatial deficit  Hemiplegia and hemiparesis following cerebral infarction affecting right  dominant side ([place])  Other symptoms and signs involving the nervous system  Other lack of coordination  Attention and concentration deficit   Problem List Patient Active Problem List   Diagnosis Date Noted   AKI (acute kidney injury) ([place]) 02/24/2021   Acute CVA (cerebrovascular accident) ([place]) 02/19/2021   Hyperlipidemia 02/19/2021   Anemia 10/12/2019   Cecal polyp 09/30/2019   History of colon polyps 09/30/2019   Abnormal colonoscopy 09/30/2019   Arthritis of left knee 08/01/2019   Type 2 diabetes mellitus without complication, without long-term current use of insulin ([place]) 05/01/2019   Dyspnea on  exertion 04/10/2019   Abnormal nuclear stress test 04/10/2019   Essential hypertension 02/27/2019   Acute pain of left knee 02/27/2019    Kathrine Cords, MSOT, OTR/L 03/04/2021, 5:59 PM  [place]. [place], [place], [address] Phone: [phone]   Fax:  [phone]  Name: Hersey Maclellan MRN: [id number] Date of Birth: 1955-05-08

## 2021-03-05 ENCOUNTER — Ambulatory Visit (INDEPENDENT_AMBULATORY_CARE_PROVIDER_SITE_OTHER): Payer: Medicare Other | Admitting: Registered Nurse

## 2021-03-05 ENCOUNTER — Other Ambulatory Visit: Payer: Self-pay

## 2021-03-05 ENCOUNTER — Encounter: Payer: Self-pay | Admitting: Registered Nurse

## 2021-03-05 VITALS — BP 156/75 | HR 75 | Temp 98.3°F | Resp 18 | Ht 68.0 in | Wt 253.6 lb

## 2021-03-05 DIAGNOSIS — I10 Essential (primary) hypertension: Secondary | ICD-10-CM | POA: Diagnosis not present

## 2021-03-05 DIAGNOSIS — N179 Acute kidney failure, unspecified: Secondary | ICD-10-CM

## 2021-03-05 DIAGNOSIS — E119 Type 2 diabetes mellitus without complications: Secondary | ICD-10-CM | POA: Diagnosis not present

## 2021-03-05 DIAGNOSIS — I639 Cerebral infarction, unspecified: Secondary | ICD-10-CM

## 2021-03-05 LAB — COMPREHENSIVE METABOLIC PANEL
ALT: 24 U/L (ref 0–53)
AST: 23 U/L (ref 0–37)
Albumin: 4.2 g/dL (ref 3.5–5.2)
Alkaline Phosphatase: 77 U/L (ref 39–117)
BUN: 21 mg/dL (ref 6–23)
CO2: 29 mEq/L (ref 19–32)
Calcium: 9.4 mg/dL (ref 8.4–10.5)
Chloride: 103 mEq/L (ref 96–112)
Creatinine, Ser: 1.12 mg/dL (ref 0.40–1.50)
GFR: 67.99 mL/min (ref >60.00)
Glucose, Bld: 166 mg/dL — ABNORMAL HIGH (ref 70–99)
Potassium: 4.5 mEq/L (ref 3.5–5.1)
Sodium: 138 mEq/L (ref 135–145)
Total Bilirubin: 0.6 mg/dL (ref 0.2–1.2)
Total Protein: 7.8 g/dL (ref 6.0–8.3)

## 2021-03-05 LAB — URINALYSIS, ROUTINE W REFLEX MICROSCOPIC
Bilirubin Urine: NEGATIVE
Hgb urine dipstick: NEGATIVE
Ketones, ur: NEGATIVE
Leukocytes,Ua: NEGATIVE
Nitrite: NEGATIVE
Specific Gravity, Urine: >=1.03 — AB (ref 1.000–1.030)
Total Protein, Urine: 30 — AB
Urine Glucose: NEGATIVE
Urobilinogen, UA: 1 (ref 0.0–1.0)
pH: 6 (ref 5.0–8.0)

## 2021-03-05 LAB — CBC WITH DIFFERENTIAL/PLATELET
Basophils Absolute: 0 10*3/uL (ref 0.0–0.1)
Basophils Relative: 0.6 % (ref 0.0–3.0)
Eosinophils Absolute: 0.2 10*3/uL (ref 0.0–0.7)
Eosinophils Relative: 2.5 % (ref 0.0–5.0)
HCT: 40.9 % (ref 39.0–52.0)
Hemoglobin: 13.4 g/dL (ref 13.0–17.0)
Lymphocytes Relative: 31.8 % (ref 12.0–46.0)
Lymphs Abs: 2.6 10*3/uL (ref 0.7–4.0)
MCHC: 32.6 g/dL (ref 30.0–36.0)
MCV: 91.8 fl (ref 78.0–100.0)
Monocytes Absolute: 0.5 10*3/uL (ref 0.1–1.0)
Monocytes Relative: 6.1 % (ref 3.0–12.0)
Neutro Abs: 4.8 10*3/uL (ref 1.4–7.7)
Neutrophils Relative %: 59 % (ref 43.0–77.0)
Platelets: 253 10*3/uL (ref 150.0–400.0)
RBC: 4.46 Mil/uL (ref 4.22–5.81)
RDW: 13.9 % (ref 11.5–15.5)
WBC: 8.1 10*3/uL (ref 4.0–10.5)

## 2021-03-05 LAB — MICROALBUMIN / CREATININE URINE RATIO
Creatinine,U: 187.7 mg/dL
Microalb Creat Ratio: 14.3 mg/g (ref 0.0–30.0)
Microalb, Ur: 26.8 mg/dL — ABNORMAL HIGH (ref 0.0–1.9)

## 2021-03-05 MED ORDER — METOPROLOL SUCCINATE ER 50 MG PO TB24: 50.0000 mg | ORAL_TABLET | Freq: Every day | ORAL | 3 refills | Status: DC

## 2021-03-05 MED ORDER — EMPAGLIFLOZIN 25 MG PO TABS: 25.0000 mg | ORAL_TABLET | Freq: Every day | ORAL | 0 refills | Status: DC

## 2021-03-05 NOTE — Patient Instructions (Signed)
Mr. Tippin -  Doristine Devoid to see you. Glad things are stable, all things considered . Let's do what we can to keep anything like this from happening again  Increase metoprolol to 50mg  24h daily.  Start jardiance 25mg  po daily. If the pharmacy says it's too pricey, let me know, we can change it.  I have referred to ophthalmology and podiatry for diabetic care.  See you in 3 mo to recheck sugars and bp  Thanks,  Denice Paradise

## 2021-03-05 NOTE — Progress Notes (Signed)
Established Patient Office Visit  Subjective:  Patient ID: Baley Lorimer, male    DOB: [birth year]  Age: 66 y.o. MRN: [id number]  CC:  Chief Complaint  Patient presents with   Hospitalization Follow-up    Patient states he is here for a hospital follow up. Patient states he had a stroke 2 weeks ago. He states his right hand is a little more weak than the left hand     HPI [place] presents for HFU  CVA  On 02/17/21 developed HA. Noted vision became impaired - blurred. Family members reported some confusion and cognitive changes.  Brought to ED by family after they noted difficulty with simple tasks, difficulty speaking, and decreased R visual field. On admission noted word finding difficulties and fatigue Ct in ED noted concern for acute stroke but no hemorrhage. Neuro consulted, pt admitted.  EKG showed BBB.   Hx of t2dm and htn. Had been lost to follow up with me prior to admission.  Had been managed by [facility].   Glucose elevated on admission to 304, A1c 12.3 - thought to be noncompliant with medication Lipids elevated to cholesterol 234, triglycerides to 410. LDL could not be calculated.  MRI results showed acute infarct in posterior mesial temporal lobe. Additional infarcts noted in L PCA territory. Diminutive L vertebral artery. Echo noted LVH with grade 1 diastolic dysfunction, EF 57-84%.  Planned for plavix x 3 weeks w asa, then asa alone.  Was planned to have inpatient rehab but forutnately made significant improvements. Was able to be dc home on 02/25/21  He has upcoming follow ups with Dr. Terri Skains in cardiology on 03/13/21, Dr. Leonie Man in Neuro on 05/07/21.  He has been resumed on diabetic and antihypertensive medications. He has PT and OT set up through the next month.  Since discharge he continues to improve Hindered by visual disturbance in R eye. Notes that this contributes to delay in action in his R hand. Continues to perceive weakness in that hand but following  with PT OT appropriately.  No other acute concerns. Feeling well "considering"  Past Medical History:  Diagnosis Date   Arthritis    Cataract    Mixed OU   Diabetes mellitus without complication ([place])    Diabetic retinopathy ([place])    NPDR OU   Hyperlipidemia    Hypertension    Hypertensive retinopathy    OU   Obesity     Past Surgical History:  Procedure Laterality Date   BIOPSY  11/29/2019   Procedure: BIOPSY;  Surgeon: Irving Copas., MD;  Location: [facility];  Service: Gastroenterology;;   COLONOSCOPY WITH PROPOFOL N/A 11/29/2019   Procedure: COLONOSCOPY WITH PROPOFOL;  Surgeon: Irving Copas., MD;  Location: [facility];  Service: Gastroenterology;  Laterality: N/A;   ENDOSCOPIC MUCOSAL RESECTION N/A 11/29/2019   Procedure: ENDOSCOPIC MUCOSAL RESECTION;  Surgeon: Rush Landmark Telford Nab., MD;  Location: [facility];  Service: Gastroenterology;  Laterality: N/A;   ESOPHAGOGASTRODUODENOSCOPY (EGD) WITH PROPOFOL N/A 11/29/2019   Procedure: ESOPHAGOGASTRODUODENOSCOPY (EGD) WITH PROPOFOL;  Surgeon: Rush Landmark Telford Nab., MD;  [facility]: [facility];  Service: Gastroenterology;  Laterality: N/A;   EYE SURGERY Left    had to have eye flushed after getting costic sodium in L eye   HEMOSTASIS CLIP PLACEMENT  11/29/2019   Procedure: HEMOSTASIS CLIP PLACEMENT;  Surgeon: Irving Copas., MD;  [facility]: [facility];  Service: Gastroenterology;;   HERNIA REPAIR     HERNIA REPAIR     35 years ago  HOT HEMOSTASIS N/A 11/29/2019   Procedure: HOT HEMOSTASIS (ARGON PLASMA COAGULATION/BICAP);  Surgeon: Irving Copas., MD;  Location: [facility];  Service: Gastroenterology;  Laterality: N/A;   LEFT HEART CATH AND CORONARY ANGIOGRAPHY N/A 04/11/2019   Procedure: LEFT HEART CATH AND CORONARY ANGIOGRAPHY;  Surgeon: Adrian Prows, MD;  Location: [facility];  Service: Cardiovascular;  Laterality: N/A;   POLYPECTOMY  11/29/2019   Procedure:  POLYPECTOMY;  Surgeon: Rush Landmark Telford Nab., MD;  Location: [facility];  Service: Gastroenterology;;   RENAL ANGIOGRAPHY Bilateral 04/11/2019   Procedure: RENAL ANGIOGRAPHY;  Surgeon: Adrian Prows, MD;  Location: [facility];  Service: Cardiovascular;  Laterality: Bilateral;   SUBMUCOSAL LIFTING INJECTION  11/29/2019   Procedure: SUBMUCOSAL LIFTING INJECTION;  Surgeon: Irving Copas., MD;  Location: [facility];  Service: Gastroenterology;;   TOTAL KNEE ARTHROPLASTY Left 08/01/2019   Procedure: LEFT TOTAL KNEE ARTHROPLASTY;  Surgeon: Meredith Pel, MD;  Location: [facility];  Service: Orthopedics;  Laterality: Left;    Family History  Problem Relation Age of Onset   Diabetes Mother    COPD Mother    Cancer Mother        unknown type    Heart disease Father    COPD Sister    Healthy Brother    Heart disease Brother    Healthy Son    Colon cancer Neg Hx    Colon polyps Neg Hx    Esophageal cancer Neg Hx    Rectal cancer Neg Hx    Stomach cancer Neg Hx    Inflammatory bowel disease Neg Hx    Liver disease Neg Hx    Pancreatic cancer Neg Hx     Social History   Socioeconomic History   Marital status: Single    Spouse name: Not on file   Number of children: 1   Years of education: Not on file   Highest education level: Not on file  Occupational History   Not on file  Tobacco Use   Smoking status: Never   Smokeless tobacco: Never  Vaping Use   Vaping Use: Never used  Substance and Sexual Activity   Alcohol use: Not Currently   Drug use: Never   Sexual activity: Not Currently  Other Topics Concern   Not on file  Social History Narrative   Not on file   Social Determinants of Health   Financial Resource Strain: Not on file  Food Insecurity: Not on file  Transportation Needs: Not on file  Physical Activity: Not on file  Stress: Not on file  Social Connections: Not on file  Intimate Partner Violence: Not on file    Outpatient Medications Prior  to Visit  Medication Sig Dispense Refill   amLODipine (NORVASC) 10 MG tablet Take 1 tablet (10 mg total) by mouth daily. 30 tablet 2   aspirin EC 81 MG tablet Take 1 tablet (81 mg total) by mouth daily. Swallow whole. 30 tablet 11   atorvastatin (LIPITOR) 80 MG tablet Take 1 tablet (80 mg total) by mouth daily. 30 tablet 2   blood glucose meter kit and supplies KIT Dispense based on patient and insurance preference. Use up to four times daily as directed. 1 each 0   clopidogrel (PLAVIX) 75 MG tablet Take 1 tablet (75 mg total) by mouth daily for 21 days. 21 tablet 0   glimepiride (AMARYL) 2 MG tablet Take 1 tablet (2 mg total) by mouth daily with breakfast. 30 tablet 1   metoprolol  succinate (TOPROL-XL) 25 MG 24 hr tablet Take 1 tablet (25 mg total) by mouth daily. 30 tablet 2   No facility-administered medications prior to visit.    Not on File  ROS Review of Systems  Constitutional: Negative.   HENT: Negative.    Eyes: Negative.   Respiratory: Negative.    Cardiovascular: Negative.   Gastrointestinal: Negative.   Genitourinary: Negative.   Musculoskeletal: Negative.   Skin: Negative.   Neurological: Negative.   Psychiatric/Behavioral: Negative.    All other systems reviewed and are negative.    Objective:    Physical Exam Constitutional:      General: He is not in acute distress.    Appearance: Normal appearance. He is normal weight. He is not ill-appearing, toxic-appearing or diaphoretic.  Cardiovascular:     Rate and Rhythm: Normal rate and regular rhythm.     Heart sounds: Normal heart sounds. No murmur heard.   No friction rub. No gallop.  Pulmonary:     Effort: Pulmonary effort is normal. No respiratory distress.     Breath sounds: Normal breath sounds. No stridor. No wheezing, rhonchi or rales.  Chest:     Chest wall: No tenderness.  Neurological:     Mental Status: He is alert and oriented to person, place, and time.     Cranial Nerves: No cranial nerve  deficit.     Sensory: No sensory deficit.     Motor: Weakness (R hand weakness) present.     Coordination: Coordination normal.     Gait: Gait normal.  Psychiatric:        Mood and Affect: Mood normal.        Behavior: Behavior normal.        Thought Content: Thought content normal.        Judgment: Judgment normal.    BP (!) 156/75    Pulse 75    Temp 98.3 F (36.8 C) (Temporal)    Resp 18    Ht _0  (1.727 m)    Wt 253 lb 9.6 oz (115 kg)    SpO2 100%    BMI 38.56 kg/m  Wt Readings from Last 3 Encounters:  03/05/21 253 lb 9.6 oz (115 kg)  01/29/20 259 lb (117.5 kg)  01/05/20 259 lb 6.4 oz (117.7 kg)     Health Maintenance Due  Topic Date Due   URINE MICROALBUMIN  Never done   TETANUS/TDAP  Never done   Zoster Vaccines- Shingrix (1 of 2) Never done   COVID-19 Vaccine (3 - Booster for Pfizer series) 12/05/2019   FOOT EXAM  07/06/2020   OPHTHALMOLOGY EXAM  10/17/2020   COLONOSCOPY (Pts 45-61yr Insurance coverage will need to be confirmed)  11/28/2020    There are no preventive care reminders to display for this patient.  Lab Results  Component Value Date   TSH 3.683 02/20/2021   Lab Results  Component Value Date   WBC 7.5 02/23/2021   HGB 13.5 02/23/2021   HCT 40.0 02/23/2021   MCV 91.3 02/23/2021   PLT 206 02/23/2021   Lab Results  Component Value Date   NA 135 02/25/2021   K 4.0 02/25/2021   CO2 24 02/25/2021   GLUCOSE 162 (H) 02/25/2021   BUN 26 (H) 02/25/2021   CREATININE 1.42 (H) 02/25/2021   BILITOT 0.6 02/20/2021   ALKPHOS 64 02/20/2021   AST 33 02/20/2021   ALT 29 02/20/2021   PROT 7.0 02/20/2021   ALBUMIN 3.4 (L) 02/20/2021   CALCIUM  8.8 (L) 02/25/2021   ANIONGAP 9 02/25/2021   GFR 71.44 09/28/2019   Lab Results  Component Value Date   CHOL 234 (H) 02/20/2021   Lab Results  Component Value Date   HDL 27 (L) 02/20/2021   Lab Results  Component Value Date   LDLCALC UNABLE TO CALCULATE IF TRIGLYCERIDE OVER 400 mg/dL 02/20/2021   Lab  Results  Component Value Date   TRIG 410 (H) 02/20/2021   Lab Results  Component Value Date   CHOLHDL 8.7 02/20/2021   Lab Results  Component Value Date   HGBA1C 12.3 (H) 02/20/2021      Assessment & Plan:   Problem List Items Addressed This Visit       Cardiovascular and Mediastinum   Essential hypertension   Relevant Medications   empagliflozin (JARDIANCE) 25 MG TABS tablet   metoprolol succinate (TOPROL-XL) 50 MG 24 hr tablet   Other Relevant Orders   Comprehensive metabolic panel   CBC with Differential/Platelet   Urinalysis   Acute CVA (cerebrovascular accident) ([place])   Relevant Medications   metoprolol succinate (TOPROL-XL) 50 MG 24 hr tablet   Other Relevant Orders   Comprehensive metabolic panel   CBC with Differential/Platelet     Endocrine   Type 2 diabetes mellitus without complication, without long-term current use of insulin (HCC) - Primary   Relevant Medications   empagliflozin (JARDIANCE) 25 MG TABS tablet   metoprolol succinate (TOPROL-XL) 50 MG 24 hr tablet   Other Relevant Orders   Comprehensive metabolic panel   CBC with Differential/Platelet   Urinalysis   Urine Microalbumin w/creat. ratio   Ambulatory referral to Ophthalmology   Ambulatory referral to Podiatry     Genitourinary   AKI (acute kidney injury) ([place])   Relevant Orders   Comprehensive metabolic panel   CBC with Differential/Platelet   Urinalysis    Meds ordered this encounter  Medications   empagliflozin (JARDIANCE) 25 MG TABS tablet    Sig: Take 1 tablet (25 mg total) by mouth daily before breakfast.    Dispense:  90 tablet    Refill:  0    Order Specific Question:   Supervising Provider    Answer:   Carlota Raspberry, JEFFREY R [[id number]]   metoprolol succinate (TOPROL-XL) 50 MG 24 hr tablet    Sig: Take 1 tablet (50 mg total) by mouth daily. Take with or immediately following a meal.    Dispense:  90 tablet    Refill:  3    Order Specific Question:   Supervising Provider     Answer:   Carlota Raspberry, JEFFREY R [[id number]]    Follow-up: Return in about 3 months (around 06/02/2021) for t2dm.   PLAN Labs collected. Will follow up with the patient as warranted. Continue pt ot. Follow with specialists as scheduled. Increase metoprolol to 16m 24h tab po qd. Start jardiance 228mpo qd for t2dm and lowering CV risk. Return in 3 mo for t2dm, htn, chronic conditions Refer to [facility] for cva, t2dm Refer to podiatry for t2dm foot care Patient encouraged to call clinic with any questions, comments, or concerns.  RiMaximiano CossNP

## 2021-03-11 ENCOUNTER — Encounter: Payer: Self-pay | Admitting: Speech Pathology

## 2021-03-11 ENCOUNTER — Ambulatory Visit: Payer: Medicare Other | Admitting: Occupational Therapy

## 2021-03-11 ENCOUNTER — Other Ambulatory Visit: Payer: Self-pay

## 2021-03-11 ENCOUNTER — Ambulatory Visit: Payer: Medicare Other | Admitting: Speech Pathology

## 2021-03-11 ENCOUNTER — Ambulatory Visit: Payer: Medicare Other | Attending: Internal Medicine | Admitting: Physical Therapy

## 2021-03-11 ENCOUNTER — Encounter: Payer: Self-pay | Admitting: Physical Therapy

## 2021-03-11 ENCOUNTER — Encounter: Payer: Self-pay | Admitting: Occupational Therapy

## 2021-03-11 DIAGNOSIS — R278 Other lack of coordination: Secondary | ICD-10-CM | POA: Insufficient documentation

## 2021-03-11 DIAGNOSIS — R29818 Other symptoms and signs involving the nervous system: Secondary | ICD-10-CM

## 2021-03-11 DIAGNOSIS — R41840 Attention and concentration deficit: Secondary | ICD-10-CM | POA: Insufficient documentation

## 2021-03-11 DIAGNOSIS — I69351 Hemiplegia and hemiparesis following cerebral infarction affecting right dominant side: Secondary | ICD-10-CM | POA: Diagnosis not present

## 2021-03-11 DIAGNOSIS — M25662 Stiffness of left knee, not elsewhere classified: Secondary | ICD-10-CM

## 2021-03-11 DIAGNOSIS — R2681 Unsteadiness on feet: Secondary | ICD-10-CM | POA: Diagnosis not present

## 2021-03-11 DIAGNOSIS — R262 Difficulty in walking, not elsewhere classified: Secondary | ICD-10-CM | POA: Diagnosis not present

## 2021-03-11 DIAGNOSIS — R41841 Cognitive communication deficit: Secondary | ICD-10-CM | POA: Diagnosis present

## 2021-03-11 DIAGNOSIS — R41842 Visuospatial deficit: Secondary | ICD-10-CM | POA: Insufficient documentation

## 2021-03-11 DIAGNOSIS — R2689 Other abnormalities of gait and mobility: Secondary | ICD-10-CM | POA: Diagnosis not present

## 2021-03-11 DIAGNOSIS — M6281 Muscle weakness (generalized): Secondary | ICD-10-CM | POA: Diagnosis not present

## 2021-03-11 NOTE — Patient Instructions (Signed)
Access Code: [email or web address] URL: [email or web address] Date: 03/11/2021 Prepared by: Ethel Rana  Exercises Side Stepping with Resistance at Feet - 1 x daily - 7 x weekly - 2 sets - 10 reps Standing Heel Raise with Support - 1 x daily - 7 x weekly - 2 sets - 10 reps Squat with Chair Touch - 1 x daily - 7 x weekly - 2 sets - 10 reps

## 2021-03-11 NOTE — Therapy (Signed)
[place] [place], [place], [address] Phone: [phone]   Fax:  [phone]  Occupational Therapy Treatment  Patient Details  Name: Joe Stewart MRN: [id number] Date of Birth: September 25, 1955 Referring Provider (OT): Bonnielee Haff, MD   Encounter Date: 03/11/2021   OT End of Session - 03/11/21 1109     Visit Number 2    Number of Visits 17    Date for OT Re-Evaluation 05/27/21    Authorization Type [employer]    OT Start Time 1100    OT Stop Time 1145    OT Time Calculation (min) 45 min    Activity Tolerance Patient tolerated treatment well    Behavior During Therapy WFL for tasks assessed/performed            Past Medical History:  Diagnosis Date   Arthritis    Cataract    Mixed OU   Diabetes mellitus without complication ([place])    Diabetic retinopathy ([place])    NPDR OU   Hyperlipidemia    Hypertension    Hypertensive retinopathy    OU   Obesity     Past Surgical History:  Procedure Laterality Date   BIOPSY  11/29/2019   Procedure: BIOPSY;  Surgeon: Irving Copas., MD;  Location: [facility];  Service: Gastroenterology;;   COLONOSCOPY WITH PROPOFOL N/A 11/29/2019   Procedure: COLONOSCOPY WITH PROPOFOL;  Surgeon: Irving Copas., MD;  Location: [facility];  Service: Gastroenterology;  Laterality: N/A;   ENDOSCOPIC MUCOSAL RESECTION N/A 11/29/2019   Procedure: ENDOSCOPIC MUCOSAL RESECTION;  Surgeon: Rush Landmark Telford Nab., MD;  [facility];  Service: Gastroenterology;  Laterality: N/A;   ESOPHAGOGASTRODUODENOSCOPY (EGD) WITH PROPOFOL N/A 11/29/2019   Procedure: ESOPHAGOGASTRODUODENOSCOPY (EGD) WITH PROPOFOL;  Surgeon: Rush Landmark Telford Nab., MD;  Location: [facility];  Service: Gastroenterology;  Laterality: N/A;   EYE SURGERY Left    had to have eye flushed after getting costic sodium in L eye   HEMOSTASIS CLIP PLACEMENT  11/29/2019   Procedure:  HEMOSTASIS CLIP PLACEMENT;  Surgeon: Irving Copas., MD;  Location: [facility];  Service: Gastroenterology;;   HERNIA REPAIR     HERNIA REPAIR     35 years ago   HOT HEMOSTASIS N/A 11/29/2019   Procedure: HOT HEMOSTASIS (ARGON PLASMA COAGULATION/BICAP);  Surgeon: Irving Copas., MD;  Location: [facility];  Service: Gastroenterology;  Laterality: N/A;   LEFT HEART CATH AND CORONARY ANGIOGRAPHY N/A 04/11/2019   Procedure: LEFT HEART CATH AND CORONARY ANGIOGRAPHY;  Surgeon: Adrian Prows, MD;  Location: [facility];  Service: Cardiovascular;  Laterality: N/A;   POLYPECTOMY  11/29/2019   Procedure: POLYPECTOMY;  Surgeon: Rush Landmark Telford Nab., MD;  Location: [facility];  Service: Gastroenterology;;   RENAL ANGIOGRAPHY Bilateral 04/11/2019   Procedure: RENAL ANGIOGRAPHY;  Surgeon: Adrian Prows, MD;  Location: [facility];  Service: Cardiovascular;  Laterality: Bilateral;   SUBMUCOSAL LIFTING INJECTION  11/29/2019   Procedure: SUBMUCOSAL LIFTING INJECTION;  Surgeon: Irving Copas., MD;  Location: [facility];  Service: Gastroenterology;;   TOTAL KNEE ARTHROPLASTY Left 08/01/2019   Procedure: LEFT TOTAL KNEE ARTHROPLASTY;  Surgeon: Meredith Pel, MD;  Location: [facility];  Service: Orthopedics;  Laterality: Left;    There were no vitals filed for this visit.   Subjective Assessment - 03/11/21 1107     Subjective  Pt reports he feels like his visual field deficit has improved and he has an appt coming up w/ his ophthalmologist  Patient is accompanied by: Family member   [name] (brother)   Pertinent History L posterior mesial temporal lobe infarct likely 2/2 L PCA occlusion from cryptogenic source (02/19/21); PMH significant for DMT2, moderate non-proliferative diabetic retinopathy (was receiving injections up until about 1 year ago), HTN, HLD, and L TKA (08/01/19)    Limitations Visual deficits; would benefit from large print for pt instructions/handouts     Patient Stated Goals Get back to living alone    Currently in Pain? No/denies             OT Education - 03/11/21 1119     Education Details Introduced visual compensatory strategies    Person(s) Educated Patient    Methods Explanation;Demonstration;Handout    Comprehension Verbalized understanding;Returned demonstration             Treatment/Exercises - 03/11/21    Coordination Activities Completed coordination activities w/ RUE or BUEs as appropriate, including: rotating small ball w/ fingertips; flipping cards off a deck; rotating cards in-hand/turning cards end-over end; pushing cards off deck using thumb; shuffling a deck of cards; picking up 5-10 coins 1 at a time and translating palm to fingertips to place in a stack. Pt required occasional verbal cues for sequencing of tasks and consistency w/ challenging higher level FM skills and in-hand manipulation            OT Short Term Goals - 03/11/21 1109       OT SHORT TERM GOAL #1   Title Pt will verbalize understanding of visual compensatory strategies to incorporate during tabletop/near visual acuity activities    Baseline No visual compensatory strategies    Time 2    Period Weeks    Status Achieved   03/11/21   Target Date 03/18/21      OT SHORT TERM GOAL #2   Title Pt will be able to complete symbol cancellation task, using AE/compensatory strategies prn w/ 100% accuracy    Baseline Completed 50% of page w/ 14/15 correct    Time 4    Period Weeks    Status On-going    Target Date 04/01/21             OT Long Term Goals - 03/11/21 1115       OT LONG TERM GOAL #1   Title Pt will demonstrate improved participation in functional FM tasks as evidenced by decreasing 9-HPT time by at least 5 seconds w/ R, dominant hand    Baseline 44 sec (L hand 36 sec)    Time 8    Period Weeks    Status On-going    Target Date 04/29/21      OT LONG TERM GOAL #2   Title Pt will be able to sort medications (simulated or  actual) correctly, using AE/compensatory strategies prn    Baseline Not currently managing medication    Time 8    Period Weeks    Status On-going    Target Date 04/29/21      OT LONG TERM GOAL #3   Title Pt will be able to complete simulated meal prep activity w/ Mod I    Baseline Not currently participating in meal prep    Time 8    Period Weeks    Status On-going    Target Date 04/29/21      OT LONG TERM GOAL #4   Title Pt will demonstrate independence w/ compensatory strategies, including AE prn, to improve handwriting legibility and efficiency    Baseline  Fair legibility; tremor observed    Time 8    Period Weeks    Status On-going    Target Date 04/29/21      OT LONG TERM GOAL #5   Title Pt will be able to complete TMT: Part B in less than 1 min, 30 sec to indicate potential appropriateness for initiating return to driving    Baseline Over 1 min, 30 sec    Time 8    Period Weeks    Status On-going    Target Date 04/29/21             Plan - 03/11/21 1116     Clinical Impression Statement OT introduced visual compensatory strategies, focusing on use of high contrast and line guides due to visual deficits apparently more related to diabetic retinopathy vs neurovision, as well as higher-level FM skills to include in HEP (translation, rotation, shift). OT also informally assessed handwriting and completed TMT: Part B for alternating attention, processing speech, and visual perception. Pt demonstrated tremor w/ decreased legibility during handwriting that he was unable to identify as premorbid or post-CVA, and may benefit from AE to increase success w/ handwriting tasks. TMT: Part B was completed successfully w/out facilitation from OT or errors, but pt did required extended time for processing.    OT Occupational Profile and History Detailed Assessment- Review of Records and additional review of physical, cognitive, psychosocial history related to current functional  performance    Occupational performance deficits (Please refer to evaluation for details): ADL's;IADL's;Leisure;Social Participation    Body Structure / Function / Physical Skills ADL;Decreased knowledge of use of DME;Strength;Balance;Dexterity;GMC;UE functional use;IADL;Vision;Coordination;FMC;Decreased knowledge of precautions    Cognitive Skills Memory;Safety Awareness    Psychosocial Skills Environmental  Adaptations    Rehab Potential Good    Clinical Decision Making Limited treatment options, no task modification necessary    Comorbidities Affecting Occupational Performance: May have comorbidities impacting occupational performance    Modification or Assistance to Complete Evaluation  No modification of tasks or assist necessary to complete eval    OT Frequency 2x / week    OT Duration 8 weeks    OT Treatment/Interventions Self-care/ADL training;DME and/or AE instruction;Balance training;Therapeutic activities;Therapeutic exercise;Visual/perceptual remediation/compensation;Neuromuscular education;Energy conservation;Manual Therapy;Patient/family education;Cognitive remediation/compensation    Plan Symbol cancellation / alternating attention task w/ line guide; med mgmt   Recommended Other Services Currently receiving PT/ST services at this location    Consulted and Agree with Plan of Care Patient            Patient will benefit from skilled therapeutic intervention in order to improve the following deficits and impairments:   Body Structure / Function / Physical Skills: ADL, Decreased knowledge of use of DME, Strength, Balance, Dexterity, GMC, UE functional use, IADL, Vision, Coordination, FMC, Decreased knowledge of precautions Cognitive Skills: Memory, Safety Awareness Psychosocial Skills: Environmental  Adaptations   Visit Diagnosis: Hemiplegia and hemiparesis following cerebral infarction affecting right dominant side (HCC)  Visuospatial deficit  Other lack of  coordination  Other symptoms and signs involving the nervous system  Attention and concentration deficit   Problem List Patient Active Problem List   Diagnosis Date Noted   AKI (acute kidney injury) ([place]) 02/24/2021   Acute CVA (cerebrovascular accident) ([place]) 02/19/2021   Hyperlipidemia 02/19/2021   Anemia 10/12/2019   Cecal polyp 09/30/2019   History of colon polyps 09/30/2019   Abnormal colonoscopy 09/30/2019   Arthritis of left knee 08/01/2019   Type 2 diabetes mellitus without complication, without long-term  current use of insulin ([place]) 05/01/2019   Dyspnea on exertion 04/10/2019   Abnormal nuclear stress test 04/10/2019   Essential hypertension 02/27/2019   Acute pain of left knee 02/27/2019    Kathrine Cords, MSOT, OTR/L 03/11/2021, 12:20 PM  [place]. [place], [place], [address] Phone: [phone]   Fax:  [phone]  Name: Emeterio Balke MRN: [id number] Date of Birth: Jun 29, 1955

## 2021-03-11 NOTE — Therapy (Signed)
[place] [place]. [place], [place], [address] Phone: [phone]   Fax:  [phone]  Physical Therapy Treatment  Patient Details  Name: Joe Stewart MRN: [id number] Date of Birth: 1955/06/13 No data recorded  Encounter Date: 03/11/2021   PT End of Session - 03/11/21 1054     Visit Number 2    Date for PT Re-Evaluation 06/01/21    PT Start Time 1012    PT Stop Time 1054    PT Time Calculation (min) 42 min    Equipment Utilized During Treatment Gait belt    Activity Tolerance Patient tolerated treatment well    Behavior During Therapy WFL for tasks assessed/performed             Past Medical History:  Diagnosis Date   Arthritis    Cataract    Mixed OU   Diabetes mellitus without complication ([place])    Diabetic retinopathy ([place])    NPDR OU   Hyperlipidemia    Hypertension    Hypertensive retinopathy    OU   Obesity     Past Surgical History:  Procedure Laterality Date   BIOPSY  11/29/2019   Procedure: BIOPSY;  Surgeon: Irving Copas., MD;  Location: [facility];  Service: Gastroenterology;;   COLONOSCOPY WITH PROPOFOL N/A 11/29/2019   Procedure: COLONOSCOPY WITH PROPOFOL;  Surgeon: Irving Copas., MD;  Location: [facility];  Service: Gastroenterology;  Laterality: N/A;   ENDOSCOPIC MUCOSAL RESECTION N/A 11/29/2019   Procedure: ENDOSCOPIC MUCOSAL RESECTION;  Surgeon: Rush Landmark Telford Nab., MD;  [facility]: [facility];  Service: Gastroenterology;  Laterality: N/A;   ESOPHAGOGASTRODUODENOSCOPY (EGD) WITH PROPOFOL N/A 11/29/2019   Procedure: ESOPHAGOGASTRODUODENOSCOPY (EGD) WITH PROPOFOL;  Surgeon: Rush Landmark Telford Nab., MD;  [facility];  Service: Gastroenterology;  Laterality: N/A;   EYE SURGERY Left    had to have eye flushed after getting costic sodium in L eye   HEMOSTASIS CLIP PLACEMENT  11/29/2019   Procedure: HEMOSTASIS CLIP PLACEMENT;  Surgeon: Irving Copas., MD;  [facility];  Service: Gastroenterology;;   HERNIA REPAIR     HERNIA REPAIR     35 years ago   HOT HEMOSTASIS N/A 11/29/2019   Procedure: HOT HEMOSTASIS (ARGON PLASMA COAGULATION/BICAP);  Surgeon: Irving Copas., MD;  Location: [facility];  Service: Gastroenterology;  Laterality: N/A;   LEFT HEART CATH AND CORONARY ANGIOGRAPHY N/A 04/11/2019   Procedure: LEFT HEART CATH AND CORONARY ANGIOGRAPHY;  Surgeon: Adrian Prows, MD;  Location: [facility];  Service: Cardiovascular;  Laterality: N/A;   POLYPECTOMY  11/29/2019   Procedure: POLYPECTOMY;  Surgeon: Rush Landmark Telford Nab., MD;  Location: [facility];  Service: Gastroenterology;;   RENAL ANGIOGRAPHY Bilateral 04/11/2019   Procedure: RENAL ANGIOGRAPHY;  Surgeon: Adrian Prows, MD;  Location: [facility];  Service: Cardiovascular;  Laterality: Bilateral;   SUBMUCOSAL LIFTING INJECTION  11/29/2019   Procedure: SUBMUCOSAL LIFTING INJECTION;  Surgeon: Irving Copas., MD;  Location: [facility];  Service: Gastroenterology;;   TOTAL KNEE ARTHROPLASTY Left 08/01/2019   Procedure: LEFT TOTAL KNEE ARTHROPLASTY;  Surgeon: Meredith Pel, MD;  Location: [facility];  Service: Orthopedics;  Laterality: Left;    There were no vitals filed for this visit.   Subjective Assessment - 03/11/21 1017     Subjective Patient reports no pain today.    Currently in Pain? No/denies  [facility] Adult PT Treatment/Exercise - 03/11/21 0001       Exercises   Exercises Knee/Hip      Knee/Hip Exercises: Standing   Heel Raises Both;1 set;10 reps    Lateral Step Up Both;1 set;10 reps;Hand Hold: 0;Step Height: 6"    Lateral Step Up Limitations 1 episode of balance loss.    Forward Step Up Both;1 set;10 reps;Hand Hold: 0;Step Height: 6"    Other Standing Knee Exercises SLS- alternately tapping each foot on a cone, Progressed to multiple taps. Also, placed one foot on 6" step  and rotate x 5, repeat with each limb    Other Standing Knee Exercises Side steps against G Tband resistance 1 x 10 each.      Knee/Hip Exercises: Seated   Sit to Sand 1 set;10 reps;without UE support                     PT Education - 03/11/21 1054     Education Details HEP    Person(s) Educated Patient    Methods Explanation;Demonstration;Handout    Comprehension Returned demonstration;Verbalized understanding              PT Short Term Goals - 03/11/21 1058       PT SHORT TERM GOAL #1   Title independent with initial HEP    Baseline Program started.    Time 2    Period Weeks    Status On-going               PT Long Term Goals - 03/04/21 0953       PT LONG TERM GOAL #1   Title Pt will be I and compliant with HEP.    Time 12    Period Weeks    Status New      PT LONG TERM GOAL #2   Title Pt will improve right hip/knee strength to 5/5 MMT tested in sitting to improve overall funciton    Time 12    Period Weeks    Status New      PT LONG TERM GOAL #3   Title improve berg balance score to 47/56    Time 12    Period Weeks    Status New      PT LONG TERM GOAL #4   Title increase DGI score to 19    Baseline 13    Time 12    Period Weeks    Status New                   Plan - 03/11/21 1056     Clinical Impression Statement Patient reports no pain, he feels more unsteady than weak. Treatment focused on balance training, but also include LE stability. He tolerated all activity well, with some SOB, occasional rest breaks. Initiated HEP.    Stability/Clinical Decision Making Evolving/Moderate complexity    Clinical Decision Making Low    Rehab Potential Good    PT Frequency 2x / week    PT Duration 12 weeks    PT Treatment/Interventions ADLs/Self Care Home Management;Gait training;Neuromuscular re-education;Balance training;Therapeutic exercise;Therapeutic activities;Functional mobility training;Stair training;Patient/family  education;Manual techniques    PT Next Visit Plan start gym and balance activities    PT Home Exercise Plan [id number]    Consulted and Agree with Plan of Care Patient             Patient will benefit from skilled therapeutic intervention in order to improve the following deficits and  impairments:  Abnormal gait, Decreased coordination, Decreased range of motion, Difficulty walking, Dizziness, Decreased activity tolerance, Decreased balance, Decreased strength, Decreased mobility  Visit Diagnosis: Hemiplegia and hemiparesis following cerebral infarction affecting right dominant side (HCC)  Muscle weakness (generalized)  Stiffness of left knee, not elsewhere classified  Unsteadiness on feet  Difficulty in walking, not elsewhere classified     Problem List Patient Active Problem List   Diagnosis Date Noted   AKI (acute kidney injury) ([place]) 02/24/2021   Acute CVA (cerebrovascular accident) ([place]) 02/19/2021   Hyperlipidemia 02/19/2021   Anemia 10/12/2019   Cecal polyp 09/30/2019   History of colon polyps 09/30/2019   Abnormal colonoscopy 09/30/2019   Arthritis of left knee 08/01/2019   Type 2 diabetes mellitus without complication, without long-term current use of insulin ([place]) 05/01/2019   Dyspnea on exertion 04/10/2019   Abnormal nuclear stress test 04/10/2019   Essential hypertension 02/27/2019   Acute pain of left knee 02/27/2019    Marcelina Morel, DPT 03/11/2021, 10:59 AM  [place]. [place], [place], [address] Phone: [phone]   Fax:  [phone]  Name: Joe Stewart MRN: [id number] Date of Birth: September 16, 1955

## 2021-03-12 ENCOUNTER — Other Ambulatory Visit: Payer: Self-pay | Admitting: *Deleted

## 2021-03-12 ENCOUNTER — Other Ambulatory Visit: Payer: Self-pay

## 2021-03-12 ENCOUNTER — Ambulatory Visit: Payer: Medicare Other | Admitting: Podiatry

## 2021-03-12 ENCOUNTER — Encounter: Payer: Self-pay | Admitting: Podiatry

## 2021-03-12 ENCOUNTER — Encounter: Payer: Self-pay | Admitting: *Deleted

## 2021-03-12 DIAGNOSIS — L97511 Non-pressure chronic ulcer of other part of right foot limited to breakdown of skin: Secondary | ICD-10-CM | POA: Diagnosis not present

## 2021-03-12 DIAGNOSIS — E11621 Type 2 diabetes mellitus with foot ulcer: Secondary | ICD-10-CM | POA: Diagnosis not present

## 2021-03-12 DIAGNOSIS — E119 Type 2 diabetes mellitus without complications: Secondary | ICD-10-CM

## 2021-03-12 NOTE — Patient Outreach (Signed)
[facility]) Care Management  03/12/2021  Deionte Spivack 18-May-1955 [id number]   [facility] Unsuccessful outreach  Mr Gaylord Seydel was referred to [facility] on 03/12/21 after a red alert was received from his Tuesday 03/11/21 1701 EMMI stroke outreach related to went to follow up appointment? No  Outreach attempt to the listed at the preferred outreach number in EPIC [phone] No answer. [facility] RN CM left HIPAA [facility] Portability and Accountability Act) compliant voicemail message along with CMs contact info.   Plan: [facility] RN CM scheduled this patient for another call attempt within 4-7 business days Unsuccessful outreach letter sent on 03/12/21 Unsuccessful outreach on 03/12/21   Joelene Millin L. Lavina Hamman, RN, BSN, [place] Coordinator Office number [phone]

## 2021-03-12 NOTE — Patient Outreach (Signed)
[facility]) Care Management  03/12/2021  Joe Stewart 1955-06-22 [id number]   [facility] follow up for EMMI stroke red alert  Joe Stewart was referred to [facility] on 03/12/21 after a red alert was received from his Tuesday 03/11/21 1701 EMMI stroke outreach related to went to follow up appointment? No   He returned a call to RN CM to updated that the EMMI red alert was an error  He denies need with assistance for medical appointment scheduling nor transportation to medical appointments  RN CM returned a call No answer Thanked pt for his follow up outreach to clarify the EMMI red alert error Advised patient that there will be further automated EMMI- post discharge calls to assess how the patient is doing following the recent hospitalization Advised the patient that another call may be received from a nurse if any of their responses were abnormal. Patient voiced understanding and was appreciative of f/u call. Left RN CM outreach number   Plans case closure for EMMI stroke no eligible for further services   Jameika Kinn L. Lavina Hamman, RN, BSN, [place] Coordinator Office number [phone] [facility] number [phone] Fax number [phone]

## 2021-03-12 NOTE — Therapy (Signed)
[place]. [place], [place], [address] Phone: [phone]   Fax:  [phone]  Speech Language Pathology Treatment  Patient Details  Name: Joe Stewart MRN: [id number] Date of Birth: Jan 09, 1956 Referring Provider (SLP): Bonnielee Haff, MD   Encounter Date: 03/11/2021   End of Session - 03/11/21 0935     Visit Number 2    Number of Visits 17    Date for SLP Re-Evaluation 06/01/21    SLP Start Time 0930    SLP Stop Time  1010    SLP Time Calculation (min) 40 min    Activity Tolerance Patient tolerated treatment well             Past Medical History:  Diagnosis Date   Arthritis    Cataract    Mixed OU   Diabetes mellitus without complication ([place])    Diabetic retinopathy ([place])    NPDR OU   Hyperlipidemia    Hypertension    Hypertensive retinopathy    OU   Obesity     Past Surgical History:  Procedure Laterality Date   BIOPSY  11/29/2019   Procedure: BIOPSY;  Surgeon: Irving Copas., MD;  Location: [facility];  Service: Gastroenterology;;   COLONOSCOPY WITH PROPOFOL N/A 11/29/2019   Procedure: COLONOSCOPY WITH PROPOFOL;  Surgeon: Irving Copas., MD;  Location: [facility];  Service: Gastroenterology;  Laterality: N/A;   ENDOSCOPIC MUCOSAL RESECTION N/A 11/29/2019   Procedure: ENDOSCOPIC MUCOSAL RESECTION;  Surgeon: Rush Landmark Telford Nab., MD;  [facility];  Service: Gastroenterology;  Laterality: N/A;   ESOPHAGOGASTRODUODENOSCOPY (EGD) WITH PROPOFOL N/A 11/29/2019   Procedure: ESOPHAGOGASTRODUODENOSCOPY (EGD) WITH PROPOFOL;  Surgeon: Rush Landmark Telford Nab., MD;  [facility];  Service: Gastroenterology;  Laterality: N/A;   EYE SURGERY Left    had to have eye flushed after getting costic sodium in L eye   HEMOSTASIS CLIP PLACEMENT  11/29/2019   Procedure: HEMOSTASIS CLIP PLACEMENT;  Surgeon: Irving Copas., MD;  [facility];  Service:  Gastroenterology;;   HERNIA REPAIR     HERNIA REPAIR     35 years ago   HOT HEMOSTASIS N/A 11/29/2019   Procedure: HOT HEMOSTASIS (ARGON PLASMA COAGULATION/BICAP);  Surgeon: Irving Copas., MD;  Location: [facility];  Service: Gastroenterology;  Laterality: N/A;   LEFT HEART CATH AND CORONARY ANGIOGRAPHY N/A 04/11/2019   Procedure: LEFT HEART CATH AND CORONARY ANGIOGRAPHY;  Surgeon: Adrian Prows, MD;  Location: [facility];  Service: Cardiovascular;  Laterality: N/A;   POLYPECTOMY  11/29/2019   Procedure: POLYPECTOMY;  Surgeon: Rush Landmark Telford Nab., MD;  Location: [facility];  Service: Gastroenterology;;   RENAL ANGIOGRAPHY Bilateral 04/11/2019   Procedure: RENAL ANGIOGRAPHY;  Surgeon: Adrian Prows, MD;  Location: [facility];  Service: Cardiovascular;  Laterality: Bilateral;   SUBMUCOSAL LIFTING INJECTION  11/29/2019   Procedure: SUBMUCOSAL LIFTING INJECTION;  Surgeon: Irving Copas., MD;  Location: [facility];  Service: Gastroenterology;;   TOTAL KNEE ARTHROPLASTY Left 08/01/2019   Procedure: LEFT TOTAL KNEE ARTHROPLASTY;  Surgeon: Meredith Pel, MD;  Location: [facility];  Service: Orthopedics;  Laterality: Left;    There were no vitals filed for this visit.   Subjective Assessment - 03/11/21 0934     Subjective "Good."    Currently in Pain? No/denies                   ADULT SLP TREATMENT - 03/12/21 1253  General Information   Behavior/Cognition Alert;Cooperative      Treatment Provided   Treatment provided Cognitive-Linquistic      Cognitive-Linquistic Treatment   Treatment focused on Cognition    Skilled Treatment Edu on how attention can impact all layers of cognition and language. Edu/training pt on strategies to increase attention (as related to language) to assist with better comprehension and recall of verbal information given. Pt reports he would benefit from managing distractions during conversations and creating lists  of verbal instructions/items.      Assessment / Recommendations / Plan   Plan Continue with current plan of care      Progression Toward Goals   Progression toward goals Progressing toward goals                SLP Short Term Goals - 03/04/21 1248       SLP SHORT TERM GOAL #1   Title Pt will increase auditory comprehension by recalling and verbalizing strategies to assist with active listening during conversation with minA.    Time 4    Period Weeks    Status New    Target Date 04/01/21      SLP SHORT TERM GOAL #2   Title Pt will comprehend functional memory or visual aids for recall of important information during structured conversations with minA.    Time 4    Period Weeks    Status New    Target Date 04/01/21      SLP SHORT TERM GOAL #3   Title Pt will complete BNT naming test to identify anomia.    Time 3    Period Weeks    Status New    Target Date 03/25/21              SLP Long Term Goals - 03/04/21 1249       SLP LONG TERM GOAL #1   Title Pt will increase auditory comprehension by recalling and verbalizing strategies to assist with active listening during  structured conversation independently.    Time 8    Period Weeks    Status New    Target Date 04/29/21      SLP LONG TERM GOAL #2   Title Pt will comprehend functional memory or visual aids for recall of important information during structured conversation independently    Time 8    Period Weeks    Status New    Target Date 04/29/21              Plan - 03/12/21 1252     Clinical Impression Statement See tx note. Pt reports cognitive sx have not changed; however, did report a positive change in vision and hearing. Cont with current POC.    Speech Therapy Frequency 2x / week    Duration 8 weeks    Treatment/Interventions Language facilitation;Environmental controls;Cueing hierarchy;SLP instruction and feedback;Cognitive reorganization;Functional tasks;Compensatory  strategies;Internal/external aids;Multimodal communcation approach;Patient/family education    Potential to Achieve Goals Good    Consulted and Agree with Plan of Care Patient;Family member/caregiver    Family Member Consulted Brother             Patient will benefit from skilled therapeutic intervention in order to improve the following deficits and impairments:   Cognitive communication deficit    Problem List Patient Active Problem List   Diagnosis Date Noted   AKI (acute kidney injury) ([place]) 02/24/2021   Acute CVA (cerebrovascular accident) ([place]) 02/19/2021   Hyperlipidemia 02/19/2021   Anemia 10/12/2019  Cecal polyp 09/30/2019   History of colon polyps 09/30/2019   Abnormal colonoscopy 09/30/2019   Arthritis of left knee 08/01/2019   Type 2 diabetes mellitus without complication, without long-term current use of insulin ([place]) 05/01/2019   Dyspnea on exertion 04/10/2019   Abnormal nuclear stress test 04/10/2019   Essential hypertension 02/27/2019   Acute pain of left knee 02/27/2019    Verdene Lennert, CCC-SLP 03/12/2021, 12:56 PM  [place]. [place], [place], [address] Phone: [phone]   Fax:  [phone]   Name: Joe Stewart MRN: [id number] Date of Birth: [birth year]

## 2021-03-12 NOTE — Patient Outreach (Signed)
Received a red flag Emmi stroke notification for Joe Stewart.  I have assigned Joellyn Quails, RN to call for follow up and determine if there are any Case Management needs.   [facility], [place], [place] Management [phone]

## 2021-03-12 NOTE — Progress Notes (Signed)
°  Subjective:  Patient ID: Joe Stewart, male    DOB: 25-Jul-1955,   MRN: [id number]  Chief Complaint  Patient presents with   Diabetes    Foot exam     66 y.o. male presents for diabetic foot exam. Does relates that about three weeks ago he had a little area on his fifth toe that got scrabbed up when he was doing some electrical work. This was three weeks ago and the area has not healed. Denies burning and tingling in feet. He is diabetic and last A1c was 12.3  . Denies any other pedal complaints. Denies n/v/f/c.   PCP: [name] MD   Past Medical History:  Diagnosis Date   Arthritis    Cataract    Mixed OU   Diabetes mellitus without complication ([place])    Diabetic retinopathy ([place])    NPDR OU   Hyperlipidemia    Hypertension    Hypertensive retinopathy    OU   Obesity     Objective:  Physical Exam: Vascular: DP/PT pulses 2/4 bilateral. CFT <3 seconds. Normal hair growth on digits. No edema.  Skin. No lacerations or abrasions bilateral feet. Right fifth lateral digit with wound noted measuring about 1.0 cm x 0.5 cm x 0.1 cm with granular base. No erythema edema or purulence noted.  Musculoskeletal: MMT 5/5 bilateral lower extremities in DF, PF, Inversion and Eversion. Deceased ROM in DF of ankle joint.  Neurological: Sensation intact to light touch.   Assessment:   1. Diabetic ulcer of toe of right foot associated with type 2 diabetes mellitus, limited to breakdown of skin ([place])   2. Type 2 diabetes mellitus without complication, without long-term current use of insulin ([place])      Plan:  Patient was evaluated and treated and all questions answered. Ulcer right fifth toe limited to breakdown of skin  -No debridement necessary.  -Dressed with betadine, DSD. -Off-loading with surgical shoe. -No abx indicated.  -Discussed glucose control and proper protein-rich diet.  -Discussed if any worsening redness, pain, fever or chills to call or may need to report to the  emergency room. Patient expressed understanding.  Patient to return in 2 week for follow-up.  No follow-ups on file.   Lorenda Peck, DPM

## 2021-03-13 ENCOUNTER — Other Ambulatory Visit: Payer: Self-pay

## 2021-03-13 ENCOUNTER — Ambulatory Visit: Payer: Medicare Other | Admitting: Cardiology

## 2021-03-13 ENCOUNTER — Encounter: Payer: Self-pay | Admitting: Cardiology

## 2021-03-13 VITALS — BP 163/85 | HR 82 | Temp 97.9°F | Resp 16 | Ht 68.0 in | Wt 258.0 lb

## 2021-03-13 DIAGNOSIS — E1165 Type 2 diabetes mellitus with hyperglycemia: Secondary | ICD-10-CM | POA: Diagnosis not present

## 2021-03-13 DIAGNOSIS — I639 Cerebral infarction, unspecified: Secondary | ICD-10-CM

## 2021-03-13 DIAGNOSIS — I4510 Unspecified right bundle-branch block: Secondary | ICD-10-CM | POA: Diagnosis not present

## 2021-03-13 DIAGNOSIS — I10 Essential (primary) hypertension: Secondary | ICD-10-CM

## 2021-03-13 DIAGNOSIS — E782 Mixed hyperlipidemia: Secondary | ICD-10-CM

## 2021-03-13 MED ORDER — LOSARTAN POTASSIUM 50 MG PO TABS: 50.0000 mg | ORAL_TABLET | Freq: Every day | ORAL | 0 refills | Status: DC

## 2021-03-13 MED ORDER — HYDROCHLOROTHIAZIDE 25 MG PO TABS: 25.0000 mg | ORAL_TABLET | Freq: Every morning | ORAL | 0 refills | Status: DC

## 2021-03-13 NOTE — Progress Notes (Signed)
Joe Stewart Date of Birth: November 17, 1955 MRN: [id number] Primary Care Provider:Morrow, Delfino Lovett, NP Primary Cardiologist: Rex Kras, DO, [facility] (established care 03/24/2019)   Date: 03/13/21 Last Office Visit: 01/29/2020  Chief Complaint  Patient presents with   Hospitalization Follow-up   Stroke     HPI  Joe Stewart is a 66 y.o. 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  Patient was last seen in the office in December 2021 and he follows up annually.  However in January 2023 he was hospitalized for an acute stroke and now presents for follow-up.  Patient states that in 2022 he lost his father due to [id number] complications and since then has been feeling depressed.  He stopped taking all of his medications for at least 6 to 7 months.  He was hospitalized at [facility] from February 19, 2021 through February 25, 2021.  His strokelike symptoms started approximately 2 days prior to his hospital admission on January 18.  He was experiencing headaches (which she usually does not have), visual disturbances, felt more confused according to his brother, and was having trouble finding words to speak.  Hospital work-up noted acute CVA CT of the head gross concerns for left temporal lobe infarct without hemorrhage.  An MRI of the brain showed acute infarct in the posterior mesial temporal lobe, with additional scattered infarcts in the posterior left thalamus and left occipital lobe, consistent with left PCA territory infarct.  LDL 124 mg/dL and hemoglobin A1c 12.3.  Echocardiogram noted moderate LVH with hyperdynamic LVEF at 70-75% and grade 1 diastolic impairment.  He now presents for follow-up.  He denies any anginal discomfort or heart failure  symptoms.  He has no residual focal neurological deficits.  Home blood pressures are still labile with SBP ranging from 017-793 mmHg and diastolic blood pressures ranging between 73-79 mmHg.  ALLERGIES: No Known Allergies  MEDICATION LIST PRIOR TO VISIT: Current Outpatient Medications on File Prior to Visit  Medication Sig Dispense Refill   amLODipine (NORVASC) 10 MG tablet Take 1 tablet (10 mg total) by mouth daily. 30 tablet 2   aspirin EC 81 MG tablet Take 1 tablet (81 mg total) by mouth daily. Swallow whole. 30 tablet 11   atorvastatin (LIPITOR) 80 MG tablet Take 1 tablet (80 mg total) by mouth daily. 30 tablet 2   blood glucose meter kit and supplies KIT Dispense based on patient and insurance preference. Use up to four times daily as directed. 1 each 0   clopidogrel (PLAVIX) 75 MG tablet Take 1 tablet (75 mg total) by mouth daily for 21 days. 21 tablet 0   empagliflozin (JARDIANCE) 25 MG TABS tablet Take 1 tablet (25 mg total) by mouth daily before breakfast. 90 tablet 0   glimepiride (AMARYL) 2 MG tablet Take 1 tablet (2 mg total) by mouth daily with breakfast. 30 tablet 1   metoprolol succinate (TOPROL-XL) 50 MG 24 hr tablet Take 1 tablet (50 mg total) by mouth daily. Take with or immediately following a meal. 90 tablet 3   No current facility-administered medications on file prior to visit.    PAST MEDICAL HISTORY: Past Medical History:  Diagnosis Date   Arthritis    Cataract    Mixed OU   Diabetes mellitus without complication ([place])    Diabetic retinopathy ([place])  NPDR OU   Hyperlipidemia    Hypertension    Hypertensive retinopathy    OU   Obesity     PAST SURGICAL HISTORY: Past Surgical History:  Procedure Laterality Date   BIOPSY  11/29/2019   Procedure: BIOPSY;  Surgeon: Rush Landmark Telford Nab., MD;  Location: [facility];  Service: Gastroenterology;;   COLONOSCOPY WITH PROPOFOL N/A 11/29/2019   Procedure: COLONOSCOPY WITH PROPOFOL;  Surgeon: Irving Copas., MD;  [facility]: [facility];  Service: Gastroenterology;  Laterality: N/A;   ENDOSCOPIC MUCOSAL RESECTION N/A 11/29/2019   Procedure: ENDOSCOPIC MUCOSAL RESECTION;  Surgeon: Rush Landmark Telford Nab., MD;  [facility]: [facility];  Service: Gastroenterology;  Laterality: N/A;   ESOPHAGOGASTRODUODENOSCOPY (EGD) WITH PROPOFOL N/A 11/29/2019   Procedure: ESOPHAGOGASTRODUODENOSCOPY (EGD) WITH PROPOFOL;  Surgeon: Rush Landmark Telford Nab., MD;  [facility];  Service: Gastroenterology;  Laterality: N/A;   EYE SURGERY Left    had to have eye flushed after getting costic sodium in L eye   HEMOSTASIS CLIP PLACEMENT  11/29/2019   Procedure: HEMOSTASIS CLIP PLACEMENT;  Surgeon: Irving Copas., MD;  [facility]: [facility];  Service: Gastroenterology;;   HERNIA REPAIR     HERNIA REPAIR     35 years ago   HOT HEMOSTASIS N/A 11/29/2019   Procedure: HOT HEMOSTASIS (ARGON PLASMA COAGULATION/BICAP);  Surgeon: Irving Copas., MD;  Location: [facility];  Service: Gastroenterology;  Laterality: N/A;   LEFT HEART CATH AND CORONARY ANGIOGRAPHY N/A 04/11/2019   Procedure: LEFT HEART CATH AND CORONARY ANGIOGRAPHY;  Surgeon: Adrian Prows, MD;  Location: [facility];  Service: Cardiovascular;  Laterality: N/A;   POLYPECTOMY  11/29/2019   Procedure: POLYPECTOMY;  Surgeon: Rush Landmark Telford Nab., MD;  Location: [facility];  Service: Gastroenterology;;   RENAL ANGIOGRAPHY Bilateral 04/11/2019   Procedure: RENAL ANGIOGRAPHY;  Surgeon: Adrian Prows, MD;  Location: [facility];  Service: Cardiovascular;  Laterality: Bilateral;   SUBMUCOSAL LIFTING INJECTION  11/29/2019   Procedure: SUBMUCOSAL LIFTING INJECTION;  Surgeon: Irving Copas., MD;  Location: [facility];  Service: Gastroenterology;;   TOTAL KNEE ARTHROPLASTY Left 08/01/2019   Procedure: LEFT TOTAL KNEE ARTHROPLASTY;  Surgeon: Meredith Pel, MD;  Location: [facility];  Service: Orthopedics;  Laterality: Left;     FAMILY HISTORY: The patient family history includes COPD in his mother and sister; Cancer in his mother; Diabetes in his mother; Healthy in his brother and son; Heart disease in his brother and father.   SOCIAL HISTORY:  The patient  reports that he has never smoked. He has never used smokeless tobacco. He reports that he does not currently use alcohol. He reports that he does not use drugs.  Review of Systems  Cardiovascular:  Negative for chest pain, dyspnea on exertion, leg swelling, orthopnea, palpitations, paroxysmal nocturnal dyspnea and syncope.  Respiratory:  Negative for shortness of breath and wheezing.   Musculoskeletal:  Positive for arthritis and joint pain.  Psychiatric/Behavioral:  Positive for depression.    PHYSICAL EXAM: Vitals with BMI 03/13/2021 03/05/2021 02/25/2021  Height '5\' 8"'  '5\' 8"'  -  Weight 258 lbs 253 lbs 10 oz -  BMI 62.26 33.35 -  Systolic 456 256 389  Diastolic 85 75 92  Pulse 82 75 79   CONSTITUTIONAL: Age-appropriate gentleman,  well-nourished, hemodynamically stable, no acute distress.  SKIN: Skin is warm and dry. No rash noted. No cyanosis. No pallor. No jaundice HEAD: Normocephalic and atraumatic.  EYES: No scleral icterus MOUTH/THROAT: Moist oral membranes.  NECK: No JVD present. No  thyromegaly noted. No carotid bruits  LYMPHATIC: No visible cervical adenopathy.  CHEST Normal respiratory effort. No intercostal retractions  LUNGS: Clear to auscultations bilaterally.  No stridor. No wheezes. No rales.  CARDIOVASCULAR: Regular rate and rhythm, positive S1-S2, no murmurs rubs or gallops appreciated. ABDOMINAL: Obese, soft, nontender, nondistended, positive bowel sounds all 4 quadrants.  No apparent ascites.  EXTREMITIES: No peripheral edema  HEMATOLOGIC: No significant bruising NEUROLOGIC: Oriented to person, place, and time. Nonfocal. Normal muscle tone.  PSYCHIATRIC: Normal mood and affect. Normal behavior. Cooperative  RADIOLOGY CT head  without contrast: 02/19/2021 1. Left temporal lobe hypodensity most suggestive of an acute or subacute PCA infarct. Brain MRI is recommended for further evaluation. 2. No intracranial hemorrhage.  MRI brain without contrast: 02/19/2021: 1. Acute infarct in the posterior mesial temporal lobe, with additional scattered infarcts in the posterior left thalamus and left occipital lobe, consistent with left PCA territory infarct. 2. Poor visualization of the left PCA, with non opacification of the proximal left P2, as well as P3 segments. Poor opacification of the left P1 and more distal P2 segment. No other significant intracranial stenosis. 3. Diminutive left vertebral artery, which is not dominant. This may be congenital and or may represent atherosclerotic stenosis. No other significant stenosis in the neck.  CARDIAC DATABASE: EKG: 03/13/2021: NSR, 77 bpm, right bundle branch block, without underlying injury pattern.  Echocardiogram: 03/27/2019:  Normal LV systolic function with visual EF 50-55%. Left ventricle cavity is normal in size. Normal global wall motion. Normal diastolic filling pattern, normal LAP. Mild left ventricular hypertrophy.Aortic sclerosis without stenosis.   Stress Testing:  Lexiscan Tetrofosmin Stress Test  03/27/2019: Myocardial perfusion is abnormal. There is a partially reversible moderate sized mild ischemia in the inferior and inferolateral region. Stress LV EF: 61%. Mild diaphragmatic attenuation noted. Intermediate risk study.   Left heart catheterization selective renal arteriogram 04/11/2019: Moderate diffuse coronary artery disease with calcification.  Right coronary artery with arteriomegaly and ectasia and slow filling. Left main: Normal. LAD arteriomegaly, ectasia noted, slow filling throughout the LAD.  Diffuse disease. Circumflex: Large vessel, arteriomegaly, diffuse ectasia noted.  Slow filling. Selective right and left renal arteriogram: Normal renal  arteries.  No evidence of renal artery stenosis.  LABORATORY DATA: CBC Latest Ref Rng & Units 03/05/2021 02/23/2021 02/20/2021  WBC 4.0 - 10.5 K/uL 8.1 7.5 7.5  Hemoglobin 13.0 - 17.0 g/dL 13.4 13.5 14.0  Hematocrit 39.0 - 52.0 % 40.9 40.0 40.1  Platelets 150.0 - 400.0 K/uL 253.0 206 201    CMP Latest Ref Rng & Units 03/05/2021 02/25/2021 02/24/2021  Glucose 70 - 99 mg/dL 166(H) 162(H) 182(H)  BUN 6 - 23 mg/dL 21 26(H) 27(H)  Creatinine 0.40 - 1.50 mg/dL 1.12 1.42(H) 1.37(H)  Sodium 135 - 145 mEq/L 138 135 137  Potassium 3.5 - 5.1 mEq/L 4.5 4.0 4.0  Chloride 96 - 112 mEq/L 103 102 105  CO2 19 - 32 mEq/L '29 24 24  ' Calcium 8.4 - 10.5 mg/dL 9.4 8.8(L) 8.8(L)  Total Protein 6.0 - 8.3 g/dL 7.8 - -  Total Bilirubin 0.2 - 1.2 mg/dL 0.6 - -  Alkaline Phos 39 - 117 U/L 77 - -  AST 0 - 37 U/L 23 - -  ALT 0 - 53 U/L 24 - -    Lipid Panel  Lab Results  Component Value Date   CHOL 234 (H) 02/20/2021   HDL 27 (L) 02/20/2021   LDLCALC UNABLE TO CALCULATE IF TRIGLYCERIDE OVER 400 mg/dL 02/20/2021  LDLDIRECT 124.9 (H) 02/20/2021   TRIG 410 (H) 02/20/2021   CHOLHDL 8.7 02/20/2021    FINAL MEDICATION LIST END OF ENCOUNTER: Meds ordered this encounter  Medications   losartan (COZAAR) 50 MG tablet    Sig: Take 1 tablet (50 mg total) by mouth daily at 10 pm.    Dispense:  90 tablet    Refill:  0   hydrochlorothiazide (HYDRODIURIL) 25 MG tablet    Sig: Take 1 tablet (25 mg total) by mouth every morning.    Dispense:  90 tablet    Refill:  0    There are no discontinued medications.    Current Outpatient Medications:    amLODipine (NORVASC) 10 MG tablet, Take 1 tablet (10 mg total) by mouth daily., Disp: 30 tablet, Rfl: 2   aspirin EC 81 MG tablet, Take 1 tablet (81 mg total) by mouth daily. Swallow whole., Disp: 30 tablet, Rfl: 11   atorvastatin (LIPITOR) 80 MG tablet, Take 1 tablet (80 mg total) by mouth daily., Disp: 30 tablet, Rfl: 2   blood glucose meter kit and supplies KIT, Dispense  based on patient and insurance preference. Use up to four times daily as directed., Disp: 1 each, Rfl: 0   clopidogrel (PLAVIX) 75 MG tablet, Take 1 tablet (75 mg total) by mouth daily for 21 days., Disp: 21 tablet, Rfl: 0   empagliflozin (JARDIANCE) 25 MG TABS tablet, Take 1 tablet (25 mg total) by mouth daily before breakfast., Disp: 90 tablet, Rfl: 0   glimepiride (AMARYL) 2 MG tablet, Take 1 tablet (2 mg total) by mouth daily with breakfast., Disp: 30 tablet, Rfl: 1   hydrochlorothiazide (HYDRODIURIL) 25 MG tablet, Take 1 tablet (25 mg total) by mouth every morning., Disp: 90 tablet, Rfl: 0   losartan (COZAAR) 50 MG tablet, Take 1 tablet (50 mg total) by mouth daily at 10 pm., Disp: 90 tablet, Rfl: 0   metoprolol succinate (TOPROL-XL) 50 MG 24 hr tablet, Take 1 tablet (50 mg total) by mouth daily. Take with or immediately following a meal., Disp: 90 tablet, Rfl: 3  IMPRESSION:    ICD-10-CM   1. Left PCA territory infarct -January 2023  I63.9 EKG 12-Lead    losartan (COZAAR) 50 MG tablet    hydrochlorothiazide (HYDRODIURIL) 25 MG tablet    2. Essential hypertension  I10 losartan (COZAAR) 50 MG tablet    hydrochlorothiazide (HYDRODIURIL) 25 MG tablet    Basic metabolic panel    Magnesium    3. Mixed hyperlipidemia  E78.2     4. RBBB  I45.10     5. Type 2 diabetes mellitus with hyperglycemia, without long-term current use of insulin (HCC)  E11.65        RECOMMENDATIONS:  Joe Stewart is a 66 y.o. 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  Left PCA territory infarct -January 2023 No residual neurological deficits. Educated on importance of secondary prevention. LDL 124 mg/dL, recommend goal <70 mg/dL. Hemoglobin A1c 12.3,  recommend A1c ~ 7.  Given his multiple risk factors recommended needed 2-week extended Holter monitor to evaluate for atrial fibrillation versus loop recorder implantation (usually 4 year battery life).  Patient states that he would prefer to proceed with loop recorder implantation.  Risks, benefits, and alternatives discussed with him at today's office visit. Recent ER  visit notes reviewed, H&P reviewed, discharge summary reviewed. Also reviewed reports of the CT of the head without contrast, MRI of the brain without contrast -results noted above for further reference. EKG today shows normal sinus rhythm without underlying ischemia or injury pattern. Reviewed the results of the echocardiogram from January 2023 summarized findings noted above.  Essential hypertension Not well controlled. Patient is requesting assistance with medication titration. We will start him on his original doses of losartan and hydrochlorothiazide with labs in 1 week to evaluate kidney function and electrolytes.  I will enroll him into principal care management for blood pressure monitoring. Further recommendations to follow.  Mixed hyperlipidemia Current LDL is not at goal. Most recent lipid profile from January 2023 independently reviewed and noted above for further reference. Recommend checking fasting lipid profile again after he has been on 6 weeks of statin therapy.  Type 2 diabetes mellitus with hyperglycemia, without long-term current use of insulin (HCC) Last hemoglobin A1c >12 Currently not on insulin -has restarted his oral antiglycemic agents.  Being followed by PCP. Continue ARB, statin therapy, jardiance.   Orders Placed This Encounter  Procedures   Basic metabolic panel   Magnesium   EKG 12-Lead   --Continue cardiac medications as reconciled in final medication list. --Return in about 4 weeks (around 04/10/2021) for Follow up s/p loop implantation and recent CVA.. Or sooner if needed. --Continue  follow-up with your primary care physician regarding the management of your other chronic comorbid conditions.  Patient's questions and concerns were addressed to his satisfaction. He voices understanding of the instructions provided during this encounter.   This note was created using a voice recognition software as a result there may be grammatical errors inadvertently enclosed that do not reflect the nature of this encounter. Every attempt is made to correct such errors.  Total time spent: 40 minutes reviewing outside records from the ED, H&P, discharge summary, independently reviewing fasting lipid profile, echocardiogram results, CT and MRI results, we are prescribing blood pressure medications, follow-up labs ordered, enrolling into principal care management.  Discussed the risks, benefits, and alternatives to loop recorder).   Rex Kras, [place], [facility]  Pager: [phone] Office: [phone]

## 2021-03-14 ENCOUNTER — Ambulatory Visit: Payer: Medicare Other | Admitting: Physical Therapy

## 2021-03-14 ENCOUNTER — Ambulatory Visit: Payer: Medicare Other | Admitting: Speech Pathology

## 2021-03-14 ENCOUNTER — Encounter: Payer: Self-pay | Admitting: Speech Pathology

## 2021-03-14 ENCOUNTER — Ambulatory Visit: Payer: Medicare Other | Admitting: Occupational Therapy

## 2021-03-14 ENCOUNTER — Ambulatory Visit: Payer: Medicare Other | Admitting: *Deleted

## 2021-03-14 ENCOUNTER — Encounter: Payer: Self-pay | Admitting: Physical Therapy

## 2021-03-14 DIAGNOSIS — R262 Difficulty in walking, not elsewhere classified: Secondary | ICD-10-CM | POA: Diagnosis not present

## 2021-03-14 DIAGNOSIS — M6281 Muscle weakness (generalized): Secondary | ICD-10-CM | POA: Diagnosis not present

## 2021-03-14 DIAGNOSIS — R41841 Cognitive communication deficit: Secondary | ICD-10-CM

## 2021-03-14 DIAGNOSIS — I69351 Hemiplegia and hemiparesis following cerebral infarction affecting right dominant side: Secondary | ICD-10-CM

## 2021-03-14 DIAGNOSIS — R41840 Attention and concentration deficit: Secondary | ICD-10-CM

## 2021-03-14 DIAGNOSIS — M25662 Stiffness of left knee, not elsewhere classified: Secondary | ICD-10-CM

## 2021-03-14 DIAGNOSIS — R41842 Visuospatial deficit: Secondary | ICD-10-CM

## 2021-03-14 DIAGNOSIS — R29818 Other symptoms and signs involving the nervous system: Secondary | ICD-10-CM

## 2021-03-14 DIAGNOSIS — R278 Other lack of coordination: Secondary | ICD-10-CM

## 2021-03-14 DIAGNOSIS — R2681 Unsteadiness on feet: Secondary | ICD-10-CM

## 2021-03-14 DIAGNOSIS — R2689 Other abnormalities of gait and mobility: Secondary | ICD-10-CM | POA: Diagnosis not present

## 2021-03-14 NOTE — Therapy (Signed)
[place] [place], [place], [address] Phone: [phone]   Fax:  [phone]  Physical Therapy Treatment  Patient Details  Name: Joe Stewart MRN: [id number] Date of Birth: 1955/07/01 No data recorded  Encounter Date: 03/14/2021   PT End of Session - 03/14/21 1203     Visit Number 3    Date for PT Re-Evaluation 06/01/21    PT Start Time 0844    PT Stop Time 0926    PT Time Calculation (min) 42 min    Equipment Utilized During Treatment Gait belt    Activity Tolerance Patient tolerated treatment well    Behavior During Therapy WFL for tasks assessed/performed             Past Medical History:  Diagnosis Date   Arthritis    Cataract    Mixed OU   Diabetes mellitus without complication ([place])    Diabetic retinopathy ([place])    NPDR OU   Hyperlipidemia    Hypertension    Hypertensive retinopathy    OU   Obesity     Past Surgical History:  Procedure Laterality Date   BIOPSY  11/29/2019   Procedure: BIOPSY;  Surgeon: Irving Copas., MD;  Location: [facility];  Service: Gastroenterology;;   COLONOSCOPY WITH PROPOFOL N/A 11/29/2019   Procedure: COLONOSCOPY WITH PROPOFOL;  Surgeon: Irving Copas., MD;  Location: [facility];  Service: Gastroenterology;  Laterality: N/A;   ENDOSCOPIC MUCOSAL RESECTION N/A 11/29/2019   Procedure: ENDOSCOPIC MUCOSAL RESECTION;  Surgeon: Rush Landmark Telford Nab., MD;  [facility];  Service: Gastroenterology;  Laterality: N/A;   ESOPHAGOGASTRODUODENOSCOPY (EGD) WITH PROPOFOL N/A 11/29/2019   Procedure: ESOPHAGOGASTRODUODENOSCOPY (EGD) WITH PROPOFOL;  Surgeon: Rush Landmark Telford Nab., MD;  [facility];  Service: Gastroenterology;  Laterality: N/A;   EYE SURGERY Left    had to have eye flushed after getting costic sodium in L eye   HEMOSTASIS CLIP PLACEMENT  11/29/2019   Procedure: HEMOSTASIS CLIP PLACEMENT;  Surgeon: Irving Copas., MD;  [facility];  Service: Gastroenterology;;   HERNIA REPAIR     HERNIA REPAIR     35 years ago   HOT HEMOSTASIS N/A 11/29/2019   Procedure: HOT HEMOSTASIS (ARGON PLASMA COAGULATION/BICAP);  Surgeon: Irving Copas., MD;  Location: [facility];  Service: Gastroenterology;  Laterality: N/A;   LEFT HEART CATH AND CORONARY ANGIOGRAPHY N/A 04/11/2019   Procedure: LEFT HEART CATH AND CORONARY ANGIOGRAPHY;  Surgeon: Adrian Prows, MD;  Location: [facility];  Service: Cardiovascular;  Laterality: N/A;   POLYPECTOMY  11/29/2019   Procedure: POLYPECTOMY;  Surgeon: Rush Landmark Telford Nab., MD;  Location: [facility];  Service: Gastroenterology;;   RENAL ANGIOGRAPHY Bilateral 04/11/2019   Procedure: RENAL ANGIOGRAPHY;  Surgeon: Adrian Prows, MD;  Location: [facility];  Service: Cardiovascular;  Laterality: Bilateral;   SUBMUCOSAL LIFTING INJECTION  11/29/2019   Procedure: SUBMUCOSAL LIFTING INJECTION;  Surgeon: Irving Copas., MD;  Location: [facility];  Service: Gastroenterology;;   TOTAL KNEE ARTHROPLASTY Left 08/01/2019   Procedure: LEFT TOTAL KNEE ARTHROPLASTY;  Surgeon: Meredith Pel, MD;  Location: [facility];  Service: Orthopedics;  Laterality: Left;    There were no vitals filed for this visit.   Subjective Assessment - 03/14/21 0852     Subjective Patient reports he has been busy this week , which has limited his ability to do his HEP.    Currently in Pain? No/denies  [place] Adult PT Treatment/Exercise - 03/14/21 0001       Knee/Hip Exercises: Aerobic   Stationary Bike L 4 x 4 minutes. 2 x 30 sec fast > 70 RPM, then backwards at 60 RPM x 60 seconds.      Knee/Hip Exercises: Plyometrics   Other Plyometric Exercises 4 square stepping over dowels placed in a + shape on floor, including changing direction.      Knee/Hip Exercises: Standing   SLS 5 minutes total, practicing on each limb in  parallel bars.    Other Standing Knee Exercises SLS- alternately tapping each foot on a cone, Progressed to multiple taps. Also, placed one foot on 6" step and rotate x 5, repeat with each limb    Other Standing Knee Exercises Side steps against G Tband resistance 1 x 10 each.                     PT Education - 03/14/21 1202     Education Details Updated HEP emphasizing SLS/balance    Person(s) Educated Patient    Methods Explanation;Demonstration;Handout    Comprehension Returned demonstration;Verbalized understanding              PT Short Term Goals - 03/11/21 1058       PT SHORT TERM GOAL #1   Title independent with initial HEP    Baseline Program started.    Time 2    Period Weeks    Status On-going               PT Long Term Goals - 03/04/21 0953       PT LONG TERM GOAL #1   Title Pt will be I and compliant with HEP.    Time 12    Period Weeks    Status New      PT LONG TERM GOAL #2   Title Pt will improve right hip/knee strength to 5/5 MMT tested in sitting to improve overall funciton    Time 12    Period Weeks    Status New      PT LONG TERM GOAL #3   Title improve berg balance score to 47/56    Time 12    Period Weeks    Status New      PT LONG TERM GOAL #4   Title increase DGI score to 19    Baseline 13    Time 12    Period Weeks    Status New                   Plan - 03/14/21 1204     Clinical Impression Statement Patient reports ocntinued feeling of unsteadiness, mild. Treatment focused on SLS to build strength and stability in each limb, speed of movement, and balance. he tolerated well, works hard, had to be cautioned to not overwork himself.    Stability/Clinical Decision Making Evolving/Moderate complexity    Clinical Decision Making Low    Rehab Potential Good    PT Frequency 2x / week    PT Duration 12 weeks    PT Treatment/Interventions ADLs/Self Care Home Management;Gait training;Neuromuscular  re-education;Balance training;Therapeutic exercise;Therapeutic activities;Functional mobility training;Stair training;Patient/family education;Manual techniques    PT Next Visit Plan start gym and balance activities    PT Home Exercise Plan [id number]    Consulted and Agree with Plan of Care Patient             Patient will benefit from skilled therapeutic intervention  in order to improve the following deficits and impairments:  Abnormal gait, Decreased coordination, Decreased range of motion, Difficulty walking, Dizziness, Decreased activity tolerance, Decreased balance, Decreased strength, Decreased mobility  Visit Diagnosis: Hemiplegia and hemiparesis following cerebral infarction affecting right dominant side (HCC)  Other lack of coordination  Other symptoms and signs involving the nervous system  Muscle weakness (generalized)  Stiffness of left knee, not elsewhere classified  Unsteadiness on feet  Difficulty in walking, not elsewhere classified     Problem List Patient Active Problem List   Diagnosis Date Noted   AKI (acute kidney injury) ([place]) 02/24/2021   Acute CVA (cerebrovascular accident) ([place]) 02/19/2021   Hyperlipidemia 02/19/2021   Anemia 10/12/2019   Cecal polyp 09/30/2019   History of colon polyps 09/30/2019   Abnormal colonoscopy 09/30/2019   Arthritis of left knee 08/01/2019   Type 2 diabetes mellitus without complication, without long-term current use of insulin ([place]) 05/01/2019   Dyspnea on exertion 04/10/2019   Abnormal nuclear stress test 04/10/2019   Essential hypertension 02/27/2019   Acute pain of left knee 02/27/2019    Marcelina Morel, DPT 03/14/2021, 12:09 PM  [place] [place], [place], [address] Phone: [phone]   Fax:  [phone]  Name: Avontae Burkhead MRN: [id number] Date of Birth: 1956/01/23

## 2021-03-14 NOTE — Patient Instructions (Signed)
Access Code: [email or web address] URL: [email or web address] Date: 03/14/2021 Prepared by: Ethel Rana  Exercises Side Stepping with Resistance at Feet - 1 x daily - 7 x weekly - 2 sets - 10 reps Standing Heel Raise with Support - 1 x daily - 7 x weekly - 2 sets - 10 reps Squat with Chair Touch - 1 x daily - 7 x weekly - 2 sets - 10 reps Single Leg Stance - 1 x daily - 7 x weekly - 3 sets - 10 reps Quick Taps on Step - 1 x daily - 7 x weekly - 3 sets - 10 reps Lateral Step Up - 1 x daily - 7 x weekly - 3 sets - 10 reps Step Up - 1 x daily - 7 x weekly - 3 sets - 10 reps

## 2021-03-14 NOTE — Therapy (Signed)
[facility]. [place], [place], [address] Phone: [phone]   Fax:  [phone]  Speech Language Pathology Treatment  Patient Details  Name: Joe Stewart MRN: [id number] Date of Birth: 15-Jun-1955 Referring Provider (SLP): Bonnielee Haff, MD   Encounter Date: 03/14/2021   End of Session - 03/14/21 1125     Visit Number 3    Number of Visits 17    Date for SLP Re-Evaluation 06/01/21    SLP Start Time 70    SLP Stop Time  1100    SLP Time Calculation (min) 45 min    Activity Tolerance Patient tolerated treatment well             Past Medical History:  Diagnosis Date   Arthritis    Cataract    Mixed OU   Diabetes mellitus without complication ([place])    Diabetic retinopathy ([place])    NPDR OU   Hyperlipidemia    Hypertension    Hypertensive retinopathy    OU   Obesity     Past Surgical History:  Procedure Laterality Date   BIOPSY  11/29/2019   Procedure: BIOPSY;  Surgeon: Irving Copas., MD;  Location: [facility];  Service: Gastroenterology;;   COLONOSCOPY WITH PROPOFOL N/A 11/29/2019   Procedure: COLONOSCOPY WITH PROPOFOL;  Surgeon: Irving Copas., MD;  Location: [facility];  Service: Gastroenterology;  Laterality: N/A;   ENDOSCOPIC MUCOSAL RESECTION N/A 11/29/2019   Procedure: ENDOSCOPIC MUCOSAL RESECTION;  Surgeon: Rush Landmark Telford Nab., MD;  Location: [facility];  Service: Gastroenterology;  Laterality: N/A;   ESOPHAGOGASTRODUODENOSCOPY (EGD) WITH PROPOFOL N/A 11/29/2019   Procedure: ESOPHAGOGASTRODUODENOSCOPY (EGD) WITH PROPOFOL;  Surgeon: Rush Landmark Telford Nab., MD;  Location: [facility];  Service: Gastroenterology;  Laterality: N/A;   EYE SURGERY Left    had to have eye flushed after getting costic sodium in L eye   HEMOSTASIS CLIP PLACEMENT  11/29/2019   Procedure: HEMOSTASIS CLIP PLACEMENT;  Surgeon: Irving Copas., MD;  Location: [facility];  Service:  Gastroenterology;;   HERNIA REPAIR     HERNIA REPAIR     35 years ago   HOT HEMOSTASIS N/A 11/29/2019   Procedure: HOT HEMOSTASIS (ARGON PLASMA COAGULATION/BICAP);  Surgeon: Irving Copas., MD;  Location: [facility];  Service: Gastroenterology;  Laterality: N/A;   LEFT HEART CATH AND CORONARY ANGIOGRAPHY N/A 04/11/2019   Procedure: LEFT HEART CATH AND CORONARY ANGIOGRAPHY;  Surgeon: Adrian Prows, MD;  Location: [facility];  Service: Cardiovascular;  Laterality: N/A;   POLYPECTOMY  11/29/2019   Procedure: POLYPECTOMY;  Surgeon: Rush Landmark Telford Nab., MD;  Location: [facility];  Service: Gastroenterology;;   RENAL ANGIOGRAPHY Bilateral 04/11/2019   Procedure: RENAL ANGIOGRAPHY;  Surgeon: Adrian Prows, MD;  Location: [facility];  Service: Cardiovascular;  Laterality: Bilateral;   SUBMUCOSAL LIFTING INJECTION  11/29/2019   Procedure: SUBMUCOSAL LIFTING INJECTION;  Surgeon: Irving Copas., MD;  Location: [facility];  Service: Gastroenterology;;   TOTAL KNEE ARTHROPLASTY Left 08/01/2019   Procedure: LEFT TOTAL KNEE ARTHROPLASTY;  Surgeon: Meredith Pel, MD;  Location: [facility];  Service: Orthopedics;  Laterality: Left;    There were no vitals filed for this visit.   Subjective Assessment - 03/14/21 1125     Subjective "I have noticed more difficulty blocking out noise."    Currently in Pain? No/denies                   ADULT SLP TREATMENT - 03/14/21  [place]   Behavior/Cognition Alert;Cooperative      Treatment Provided   Treatment provided Cognitive-Linquistic      Cognitive-Linquistic Treatment   Treatment focused on Cognition    Skilled Treatment Edu on how attention can impact all layers of cognition and language. Edu/training pt on strategies to increase attention (as related to language) to assist with better comprehension and recall of verbal information given. Pt reported that he has noticed difficulty with  focus during conversations when there "are other things going on". He specifically noticed this when he was at dinner and when he was trying to write his grocery list with the TV on. SLP edu on active listening skills (clarification, probing questions, paraphrasing, and summarizing). Pt would benefit from continued practice of this next session before beginning memory strategies.      Assessment / Recommendations / Plan   Plan Continue with current plan of care      Progression Toward Goals   Progression toward goals Progressing toward goals                SLP Short Term Goals - 03/04/21 1248       SLP SHORT TERM GOAL #1   Title Pt will increase auditory comprehension by recalling and verbalizing strategies to assist with active listening during conversation with minA.    Time 4    Period Weeks    Status New    Target Date 04/01/21      SLP SHORT TERM GOAL #2   Title Pt will comprehend functional memory or visual aids for recall of important information during structured conversations with minA.    Time 4    Period Weeks    Status New    Target Date 04/01/21      SLP SHORT TERM GOAL #3   Title Pt will complete BNT naming test to identify anomia.    Time 3    Period Weeks    Status New    Target Date 03/25/21              SLP Long Term Goals - 03/04/21 1249       SLP LONG TERM GOAL #1   Title Pt will increase auditory comprehension by recalling and verbalizing strategies to assist with active listening during  structured conversation independently.    Time 8    Period Weeks    Status New    Target Date 04/29/21      SLP LONG TERM GOAL #2   Title Pt will comprehend functional memory or visual aids for recall of important information during structured conversation independently    Time 8    Period Weeks    Status New    Target Date 04/29/21              Plan - 03/14/21 1125     Clinical Impression Statement See tx note.  Cont with current POC.     Speech Therapy Frequency 2x / week    Duration 8 weeks    Treatment/Interventions Language facilitation;Environmental controls;Cueing hierarchy;SLP instruction and feedback;Cognitive reorganization;Functional tasks;Compensatory strategies;Internal/external aids;Multimodal communcation approach;Patient/family education    Potential to Achieve Goals Good    Consulted and Agree with Plan of Care Patient;Family member/caregiver    Family Member Consulted Brother             Patient will benefit from skilled therapeutic intervention in order to improve the following deficits and impairments:  Cognitive communication deficit    Problem List Patient Active Problem List   Diagnosis Date Noted   AKI (acute kidney injury) ([place]) 02/24/2021   Acute CVA (cerebrovascular accident) ([place]) 02/19/2021   Hyperlipidemia 02/19/2021   Anemia 10/12/2019   Cecal polyp 09/30/2019   History of colon polyps 09/30/2019   Abnormal colonoscopy 09/30/2019   Arthritis of left knee 08/01/2019   Type 2 diabetes mellitus without complication, without long-term current use of insulin ([place]) 05/01/2019   Dyspnea on exertion 04/10/2019   Abnormal nuclear stress test 04/10/2019   Essential hypertension 02/27/2019   Acute pain of left knee 02/27/2019    Verdene Lennert, CCC-SLP 03/14/2021, 11:25 AM  [facility]. [place], [place], [address] Phone: [phone]   Fax:  [phone]   Name: Joe Stewart MRN: [id number] Date of Birth: 09/29/1955

## 2021-03-15 NOTE — Therapy (Signed)
[facility]. [place], [place], [address] Phone: [phone]   Fax:  [phone]  Occupational Therapy Treatment  Patient Details  Name: Joe Stewart MRN: [id number] Date of Birth: Jan 03, 1956 Referring Provider (OT): Bonnielee Haff, MD   Encounter Date: 03/14/2021   OT End of Session - 03/14/21 0958     Visit Number 3    Number of Visits 17    Date for OT Re-Evaluation 05/27/21    Authorization Type [employer]    OT Start Time 0932    OT Stop Time 1014    OT Time Calculation (min) 42 min    Activity Tolerance Patient tolerated treatment well    Behavior During Therapy [facility] for tasks assessed/performed            Past Medical History:  Diagnosis Date   Arthritis    Cataract    Mixed OU   Diabetes mellitus without complication ([place])    Diabetic retinopathy ([place])    NPDR OU   Hyperlipidemia    Hypertension    Hypertensive retinopathy    OU   Obesity     Past Surgical History:  Procedure Laterality Date   BIOPSY  11/29/2019   Procedure: BIOPSY;  Surgeon: Irving Copas., MD;  Location: [facility];  Service: Gastroenterology;;   COLONOSCOPY WITH PROPOFOL N/A 11/29/2019   Procedure: COLONOSCOPY WITH PROPOFOL;  Surgeon: Irving Copas., MD;  Location: [facility];  Service: Gastroenterology;  Laterality: N/A;   ENDOSCOPIC MUCOSAL RESECTION N/A 11/29/2019   Procedure: ENDOSCOPIC MUCOSAL RESECTION;  Surgeon: Rush Landmark Telford Nab., MD;  Location: [facility];  Service: Gastroenterology;  Laterality: N/A;   ESOPHAGOGASTRODUODENOSCOPY (EGD) WITH PROPOFOL N/A 11/29/2019   Procedure: ESOPHAGOGASTRODUODENOSCOPY (EGD) WITH PROPOFOL;  Surgeon: Rush Landmark Telford Nab., MD;  Location: [facility];  Service: Gastroenterology;  Laterality: N/A;   EYE SURGERY Left    had to have eye flushed after getting costic sodium in L eye   HEMOSTASIS CLIP PLACEMENT  11/29/2019   Procedure:  HEMOSTASIS CLIP PLACEMENT;  Surgeon: Irving Copas., MD;  Location: [facility];  Service: Gastroenterology;;   HERNIA REPAIR     HERNIA REPAIR     35 years ago   HOT HEMOSTASIS N/A 11/29/2019   Procedure: HOT HEMOSTASIS (ARGON PLASMA COAGULATION/BICAP);  Surgeon: Irving Copas., MD;  Location: [facility];  Service: Gastroenterology;  Laterality: N/A;   LEFT HEART CATH AND CORONARY ANGIOGRAPHY N/A 04/11/2019   Procedure: LEFT HEART CATH AND CORONARY ANGIOGRAPHY;  Surgeon: Adrian Prows, MD;  Location: [facility];  Service: Cardiovascular;  Laterality: N/A;   POLYPECTOMY  11/29/2019   Procedure: POLYPECTOMY;  Surgeon: Rush Landmark Telford Nab., MD;  Location: [facility];  Service: Gastroenterology;;   RENAL ANGIOGRAPHY Bilateral 04/11/2019   Procedure: RENAL ANGIOGRAPHY;  Surgeon: Adrian Prows, MD;  Location: [facility];  Service: Cardiovascular;  Laterality: Bilateral;   SUBMUCOSAL LIFTING INJECTION  11/29/2019   Procedure: SUBMUCOSAL LIFTING INJECTION;  Surgeon: Irving Copas., MD;  Location: [facility];  Service: Gastroenterology;;   TOTAL KNEE ARTHROPLASTY Left 08/01/2019   Procedure: LEFT TOTAL KNEE ARTHROPLASTY;  Surgeon: Meredith Pel, MD;  Location: [facility];  Service: Orthopedics;  Laterality: Left;    There were no vitals filed for this visit.   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 No/denies             OT Education - 03/14/21 1001     Education Details Continued condition-specific education, including overall understanding of condition, risk factors, and secondary prevention    Person(s) Educated Patient    Methods Explanation;Handout    Comprehension Verbalized understanding             Treatment/Exercises - 03/14/21    Handwriting Pt practiced handwriting on standard lined paper using standard pen, weighted pen, and pen w/ built-up handle w/ fair legibility. Pt demonstrated tremor/ataxia impacting control and smoothness of lines. Least success when using built-up handle only; most success w/ control when using weighted pen w/out built-up handle    [place] Fully extending RUE to tap top of cones w/ 1 positioned on R side and 2nd slightly across midline to facilitate [place], coordination, and targeting. OT incorporated 2.5 lb wrist weight during second set w/ slight decrease in tremor observed.            OT Short Term Goals - 03/14/21 1004       OT SHORT TERM GOAL #1   Title Pt will verbalize understanding of visual compensatory strategies to incorporate during tabletop/near visual acuity activities    Baseline No visual compensatory strategies    Time 2    Period Weeks    Status Achieved   03/11/21   Target Date 03/18/21      OT SHORT TERM GOAL #2   Title Pt will be able to complete symbol cancellation task, using AE/compensatory strategies prn w/ 100% accuracy    Baseline Completed 50% of page w/ 14/15 correct    Time 4    Period Weeks    Status Achieved   03/14/21 - 24/24 using line guide   Target Date 04/01/21             OT Long Term Goals - 03/11/21 1115       OT LONG TERM GOAL #1   Title Pt will demonstrate improved participation in functional FM tasks as evidenced by decreasing 9-HPT time by at least 5 seconds w/ R, dominant hand     Baseline 44 sec (L hand 36 sec)    Time 8    Period Weeks    Status On-going    Target Date 04/29/21      OT LONG TERM GOAL #2   Title Pt will be able to sort medications (simulated or actual) correctly, using AE/compensatory strategies prn    Baseline Not currently managing medication    Time 8    Period Weeks    Status On-going    Target Date 04/29/21      OT LONG TERM GOAL #3   Title Pt will be able to complete simulated meal prep activity w/ Mod I    Baseline Not currently participating in meal prep    Time 8    Period Weeks    Status  On-going    Target Date 04/29/21      OT LONG TERM GOAL #4   Title Pt will demonstrate independence w/ compensatory strategies, including AE prn, to improve handwriting legibility and efficiency    Baseline Fair legibility; tremor observed    Time 8    Period Weeks    Status On-going    Target Date 04/29/21      OT LONG TERM GOAL #5   Title Pt will be able to complete TMT: Part B in less than 1 min, 30 sec to indicate potential appropriateness for initiating return to driving    Baseline Over 1 min, 30 sec    Time 8    Period Weeks    Status On-going    Target Date 04/29/21             Plan - 03/14/21 1002     Clinical Impression Statement Thorough discussion regarding CVA-specific education facilitated this session w/ OT providing relevant support and encouragement as well as answering pt questions as best as able. OT also provided basic education regarding cardiologist assessing for afib poststroke (indicated per MD notes) due to pt's report of lack of understanding at start of session. OT then continued to address functional recovery, focusing this session on [place] and coordination. Unsure if observed decrease in control/coordination of R hand was present prior to CVA or not; will continue to assess.    OT Occupational Profile and History Detailed Assessment- Review of Records and additional review of physical, cognitive, psychosocial  history related to current functional performance    Occupational performance deficits (Please refer to evaluation for details): ADL's;IADL's;Leisure;Social Participation    Body Structure / Function / Physical Skills ADL;Decreased knowledge of use of DME;Strength;Balance;Dexterity;GMC;UE functional use;IADL;Vision;Coordination;FMC;Decreased knowledge of precautions    Cognitive Skills Memory;Safety Awareness    Psychosocial Skills Environmental  Adaptations    Rehab Potential Good    Clinical Decision Making Limited treatment options, no task modification necessary    Comorbidities Affecting Occupational Performance: May have comorbidities impacting occupational performance    Modification or Assistance to Complete Evaluation  No modification of tasks or assist necessary to complete eval    OT Frequency 2x / week    OT Duration 8 weeks    OT Treatment/Interventions Self-care/ADL training;DME and/or AE instruction;Balance training;Therapeutic activities;Therapeutic exercise;Visual/perceptual remediation/compensation;Neuromuscular education;Energy conservation;Manual Therapy;Patient/family education;Cognitive remediation/compensation    Plan Alternating attention task w/ line guide; med mgmt or meal prep   Recommended Other Services Currently receiving PT/ST services at this location    Consulted and Agree with Plan of Care Patient            Patient will benefit from skilled therapeutic intervention in order to improve the following deficits and impairments:   Body Structure / Function / Physical Skills: ADL, Decreased knowledge of use of DME, Strength, Balance, Dexterity, GMC, UE functional use, IADL, Vision, Coordination, FMC, Decreased knowledge of precautions Cognitive Skills: Memory, Safety Awareness Psychosocial Skills: Environmental  Adaptations   Visit Diagnosis: Hemiplegia and hemiparesis following cerebral infarction affecting right dominant side (HCC)  Other lack of  coordination  Other symptoms and signs involving the nervous system  Attention and concentration deficit  Visuospatial deficit   Problem List Patient Active Problem List   Diagnosis Date Noted   AKI (acute kidney injury) ([place]) 02/24/2021   Acute CVA (cerebrovascular accident) ([place]) 02/19/2021   Hyperlipidemia 02/19/2021   Anemia 10/12/2019   Cecal polyp 09/30/2019   History of colon polyps 09/30/2019  Abnormal colonoscopy 09/30/2019   Arthritis of left knee 08/01/2019   Type 2 diabetes mellitus without complication, without long-term current use of insulin ([place]) 05/01/2019   Dyspnea on exertion 04/10/2019   Abnormal nuclear stress test 04/10/2019   Essential hypertension 02/27/2019   Acute pain of left knee 02/27/2019    Kathrine Cords, MSOT, OTR/L 03/14/2021, 12:47 PM  [facility]. [place], [place], [address] Phone: [phone]   Fax:  [phone]  Name: Joe Stewart MRN: [id number] Date of Birth: 1955-08-18

## 2021-03-18 ENCOUNTER — Ambulatory Visit: Payer: Medicare Other | Admitting: Occupational Therapy

## 2021-03-18 ENCOUNTER — Other Ambulatory Visit: Payer: Self-pay

## 2021-03-18 ENCOUNTER — Ambulatory Visit: Payer: Medicare Other | Admitting: Speech Pathology

## 2021-03-18 ENCOUNTER — Ambulatory Visit: Payer: Medicare Other | Admitting: Physical Therapy

## 2021-03-18 ENCOUNTER — Encounter: Payer: Self-pay | Admitting: Physical Therapy

## 2021-03-18 ENCOUNTER — Encounter: Payer: Self-pay | Admitting: Speech Pathology

## 2021-03-18 ENCOUNTER — Encounter: Payer: Self-pay | Admitting: Occupational Therapy

## 2021-03-18 DIAGNOSIS — R29818 Other symptoms and signs involving the nervous system: Secondary | ICD-10-CM

## 2021-03-18 DIAGNOSIS — R2689 Other abnormalities of gait and mobility: Secondary | ICD-10-CM

## 2021-03-18 DIAGNOSIS — R262 Difficulty in walking, not elsewhere classified: Secondary | ICD-10-CM | POA: Diagnosis not present

## 2021-03-18 DIAGNOSIS — R2681 Unsteadiness on feet: Secondary | ICD-10-CM

## 2021-03-18 DIAGNOSIS — R41841 Cognitive communication deficit: Secondary | ICD-10-CM

## 2021-03-18 DIAGNOSIS — R278 Other lack of coordination: Secondary | ICD-10-CM

## 2021-03-18 DIAGNOSIS — I69351 Hemiplegia and hemiparesis following cerebral infarction affecting right dominant side: Secondary | ICD-10-CM

## 2021-03-18 DIAGNOSIS — M6281 Muscle weakness (generalized): Secondary | ICD-10-CM

## 2021-03-18 DIAGNOSIS — R41840 Attention and concentration deficit: Secondary | ICD-10-CM

## 2021-03-18 DIAGNOSIS — M25662 Stiffness of left knee, not elsewhere classified: Secondary | ICD-10-CM | POA: Diagnosis not present

## 2021-03-18 NOTE — Therapy (Signed)
[place]. [place], [place], [address] Phone: [phone]   Fax:  [phone]  Occupational Therapy Treatment  Patient Details  Name: Joe Stewart MRN: [id number] Date of Birth: November 30, 1955 Referring Provider (OT): Bonnielee Haff, MD   Encounter Date: 03/18/2021   OT End of Session - 03/18/21 1103     Visit Number 4    Number of Visits 17    Date for OT Re-Evaluation 05/27/21    Authorization Type [employer]    OT Start Time 1102    OT Stop Time 1145    OT Time Calculation (min) 43 min    Activity Tolerance Patient tolerated treatment well    Behavior During Therapy [facility] for tasks assessed/performed            Past Medical History:  Diagnosis Date   Arthritis    Cataract    Mixed OU   Diabetes mellitus without complication ([place])    Diabetic retinopathy ([place])    NPDR OU   Hyperlipidemia    Hypertension    Hypertensive retinopathy    OU   Obesity     Past Surgical History:  Procedure Laterality Date   BIOPSY  11/29/2019   Procedure: BIOPSY;  Surgeon: Irving Copas., MD;  Location: [facility];  Service: Gastroenterology;;   COLONOSCOPY WITH PROPOFOL N/A 11/29/2019   Procedure: COLONOSCOPY WITH PROPOFOL;  Surgeon: Irving Copas., MD;  Location: [facility];  Service: Gastroenterology;  Laterality: N/A;   ENDOSCOPIC MUCOSAL RESECTION N/A 11/29/2019   Procedure: ENDOSCOPIC MUCOSAL RESECTION;  Surgeon: Rush Landmark Telford Nab., MD;  Location: [facility];  Service: Gastroenterology;  Laterality: N/A;   ESOPHAGOGASTRODUODENOSCOPY (EGD) WITH PROPOFOL N/A 11/29/2019   Procedure: ESOPHAGOGASTRODUODENOSCOPY (EGD) WITH PROPOFOL;  Surgeon: Rush Landmark Telford Nab., MD;  [facility]: [facility];  Service: Gastroenterology;  Laterality: N/A;   EYE SURGERY Left    had to have eye flushed after getting costic sodium in L eye   HEMOSTASIS CLIP PLACEMENT  11/29/2019   Procedure:  HEMOSTASIS CLIP PLACEMENT;  Surgeon: Irving Copas., MD;  [facility]: [facility];  Service: Gastroenterology;;   HERNIA REPAIR     HERNIA REPAIR     35 years ago   HOT HEMOSTASIS N/A 11/29/2019   Procedure: HOT HEMOSTASIS (ARGON PLASMA COAGULATION/BICAP);  Surgeon: Irving Copas., MD;  Location: [facility];  Service: Gastroenterology;  Laterality: N/A;   LEFT HEART CATH AND CORONARY ANGIOGRAPHY N/A 04/11/2019   Procedure: LEFT HEART CATH AND CORONARY ANGIOGRAPHY;  Surgeon: Adrian Prows, MD;  Location: [facility] [facility];  Service: Cardiovascular;  Laterality: N/A;   POLYPECTOMY  11/29/2019   Procedure: POLYPECTOMY;  Surgeon: Rush Landmark Telford Nab., MD;  Location: [facility];  Service: Gastroenterology;;   RENAL ANGIOGRAPHY Bilateral 04/11/2019   Procedure: RENAL ANGIOGRAPHY;  Surgeon: Adrian Prows, MD;  Location: [facility];  Service: Cardiovascular;  Laterality: Bilateral;   SUBMUCOSAL LIFTING INJECTION  11/29/2019   Procedure: SUBMUCOSAL LIFTING INJECTION;  Surgeon: Irving Copas., MD;  Location: [facility];  Service: Gastroenterology;;   TOTAL KNEE ARTHROPLASTY Left 08/01/2019   Procedure: LEFT TOTAL KNEE ARTHROPLASTY;  Surgeon: Meredith Pel, MD;  Location: [facility];  Service: Orthopedics;  Laterality: Left;    There were no vitals filed for this visit.   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 No/denies           +  Treatment/Exercises - 03/18/21    NMR Activity  Weight shifting to R side while sitting EOM and weight bearing through R hand w/ disengaging of contralateral LUE to retrieve easy-grip pegs from low level/step stool. Once pt retrieved a full set of 1 color, pt used R hand only to rotated pegs in-hand and copy pattern into large pegboard; able to complete w/ Mod I after initial breakdown of task to facilitate understanding.    [facility] Picking up multiple marbles one at a time, translating each to fingertips, and placing on easy-grip pegs; OT graded activity up from 3 marbles at a time to 7. Completed w/out drops    Resistance Clothespins Using resistance clothespin (blue; 6 lbs) to retrieve marbles off easy-grip pegs and return back to container; activity facilitated visual perception, palmar pinch/intrinsic hand strengthening, and control/coordination. Pt completed 25 marbles w/ only occ drops; OT also incorporated non-slip grips onto resistance clothespin for increased traction w/ positive results.    Medication Management Simulated medication mgmt problem-solving activity w/ pt identifying errors in pillbox of increasing complexity (e.g., higher number of pills, multi-component instruction). Pt required Mod I w/ 2-step instruction w/ 2 pills (extended time, initial breakdown of task); Mod A w/ first 3-step, 2-pill trial and Mod I w/ second 3-step, 2-pill trial; Min A w/ 4-step, 3-pill attempt  Completed simulated med mgmt activity w/ Min A to double-check med directions before completing/correction of errors and 1 error using pillbox; pt started to place 1 pill in AM and PM slots when direction called for 2 pills per AM and PM slots. OT did discuss pt attempting this at home but only either with his brother to monitor for errors or his brother double-checking after completion to identify any errors that need to  be corrected; pt verbalized understanding.            OT Short Term Goals - 03/14/21 1004       OT SHORT TERM GOAL #1   Title Pt will verbalize understanding of visual compensatory strategies to incorporate during tabletop/near visual acuity activities    Baseline No visual compensatory strategies    Time 2    Period Weeks    Status Achieved   03/11/21   Target Date 03/18/21      OT SHORT TERM GOAL #2   Title Pt will be able to complete symbol cancellation task, using AE/compensatory strategies prn w/ 100% accuracy    Baseline Completed 50% of page w/ 14/15 correct    Time 4    Period Weeks    Status Achieved   03/14/21 - 24/24 using line guide   Target Date 04/01/21             OT Long Term Goals - 03/11/21 1115       OT LONG TERM GOAL #1   Title Pt will demonstrate improved participation in functional FM tasks as evidenced by decreasing 9-HPT time by  at least 5 seconds w/ R, dominant hand    Baseline 44 sec (L hand 36 sec)    Time 8    Period Weeks    Status On-going    Target Date 04/29/21      OT LONG TERM GOAL #2   Title Pt will be able to sort medications (simulated or actual) correctly, using AE/compensatory strategies prn    Baseline Not currently managing medication    Time 8    Period Weeks    Status On-going    Target Date 04/29/21      OT LONG TERM GOAL #3   Title Pt will be able to complete simulated meal prep activity w/ Mod I    Baseline Not currently participating in meal prep    Time 8    Period Weeks    Status On-going    Target Date 04/29/21      OT LONG TERM GOAL #4   Title Pt will demonstrate independence w/ compensatory strategies, including AE prn, to improve handwriting legibility and efficiency    Baseline Fair legibility; tremor observed    Time 8    Period Weeks    Status On-going    Target Date 04/29/21      OT LONG TERM GOAL #5   Title Pt will be able to complete TMT: Part B in less than 1 min, 30 sec to indicate potential  appropriateness for initiating return to driving    Baseline Over 1 min, 30 sec    Time 8    Period Weeks    Status On-going    Target Date 04/29/21             Plan - 03/18/21 1104     Clinical Impression Statement Session today focused on NMR of RUE [facility] and coordination. Weight bearing through R hand incorporated at start of session to increase weight and proprioceptive input over affected UE w/ decrease in ataxia/tremors observed during following FM tasks. Considering success w/ both in-hand rotation of pegs and palm-to-finger translation w/ marbles, OT reassessed 9-HPT w/ pt demonstrating 9 sec improvement since previous measurement just 2 weeks ago! OT then progressed to more functional task involving medication managment w/ pt continuing to experience deficits w/ working memory, alternating attention, and self-monitoring.    OT Occupational Profile and History Detailed Assessment- Review of Records and additional review of physical, cognitive, psychosocial history related to current functional performance    Occupational performance deficits (Please refer to evaluation for details): ADL's;IADL's;Leisure;Social Participation    Body Structure / Function / Physical Skills ADL;Decreased knowledge of use of DME;Strength;Balance;Dexterity;GMC;UE functional use;IADL;Vision;Coordination;FMC;Decreased knowledge of precautions    Cognitive Skills Memory;Safety Awareness    Psychosocial Skills Environmental  Adaptations    Rehab Potential Good    Clinical Decision Making Limited treatment options, no task modification necessary    Comorbidities Affecting Occupational Performance: May have comorbidities impacting occupational performance    Modification or Assistance to Complete Evaluation  No modification of tasks or assist necessary to complete eval    OT Frequency 2x / week    OT Duration 8 weeks    OT Treatment/Interventions Self-care/ADL training;DME and/or AE instruction;Balance  training;Therapeutic activities;Therapeutic exercise;Visual/perceptual remediation/compensation;Neuromuscular education;Energy conservation;Manual Therapy;Patient/family education;Cognitive remediation/compensation    Plan Alternating attention task w/ line guide; continue w/ med mgmt/meal prep tasks    Recommended Other Services Currently receiving PT/ST services at this location    Consulted and Agree with Plan of Care Patient  Patient will benefit from skilled therapeutic intervention in order to improve the following deficits and impairments:   Body Structure / Function / Physical Skills: ADL, Decreased knowledge of use of DME, Strength, Balance, Dexterity, GMC, UE functional use, IADL, Vision, Coordination, FMC, Decreased knowledge of precautions Cognitive Skills: Memory, Safety Awareness Psychosocial Skills: Environmental  Adaptations   Visit Diagnosis: Hemiplegia and hemiparesis following cerebral infarction affecting right dominant side ([place])  Other lack of coordination  Other symptoms and signs involving the nervous system  Muscle weakness (generalized)  Attention and concentration deficit   Problem List Patient Active Problem List   Diagnosis Date Noted   AKI (acute kidney injury) ([place]) 02/24/2021   Acute CVA (cerebrovascular accident) ([place]) 02/19/2021   Hyperlipidemia 02/19/2021   Anemia 10/12/2019   Cecal polyp 09/30/2019   History of colon polyps 09/30/2019   Abnormal colonoscopy 09/30/2019   Arthritis of left knee 08/01/2019   Type 2 diabetes mellitus without complication, without long-term current use of insulin ([place]) 05/01/2019   Dyspnea on exertion 04/10/2019   Abnormal nuclear stress test 04/10/2019   Essential hypertension 02/27/2019   Acute pain of left knee 02/27/2019    Kathrine Cords, MSOT, OTR/L 03/18/2021, 5:33 PM  [place] [place]. [place], [place], [address] Phone:  [phone]   Fax:  [phone]  Name: Joe Stewart MRN: [id number] Date of Birth: Jan 11, 1956

## 2021-03-18 NOTE — Therapy (Signed)
[place]. [place], [place], [address] Phone: [phone]   Fax:  [phone]  Physical Therapy Treatment  Patient Details  Name: Joe Stewart MRN: [id number] Date of Birth: 13-Jun-1955 No data recorded  Encounter Date: 03/18/2021   PT End of Session - 03/18/21 1013     Visit Number 4    Date for PT Re-Evaluation 06/01/21    PT Start Time 0931    PT Stop Time 1012    PT Time Calculation (min) 41 min    Equipment Utilized During Treatment Gait belt    Activity Tolerance Patient tolerated treatment well    Behavior During Therapy WFL for tasks assessed/performed             Past Medical History:  Diagnosis Date   Arthritis    Cataract    Mixed OU   Diabetes mellitus without complication ([place])    Diabetic retinopathy ([place])    NPDR OU   Hyperlipidemia    Hypertension    Hypertensive retinopathy    OU   Obesity     Past Surgical History:  Procedure Laterality Date   BIOPSY  11/29/2019   Procedure: BIOPSY;  Surgeon: Irving Copas., MD;  Location: [facility];  Service: Gastroenterology;;   COLONOSCOPY WITH PROPOFOL N/A 11/29/2019   Procedure: COLONOSCOPY WITH PROPOFOL;  Surgeon: Irving Copas., MD;  Location: [facility];  Service: Gastroenterology;  Laterality: N/A;   ENDOSCOPIC MUCOSAL RESECTION N/A 11/29/2019   Procedure: ENDOSCOPIC MUCOSAL RESECTION;  Surgeon: Rush Landmark Telford Nab., MD;  [facility]: [facility];  Service: Gastroenterology;  Laterality: N/A;   ESOPHAGOGASTRODUODENOSCOPY (EGD) WITH PROPOFOL N/A 11/29/2019   Procedure: ESOPHAGOGASTRODUODENOSCOPY (EGD) WITH PROPOFOL;  Surgeon: Rush Landmark Telford Nab., MD;  [facility]: [facility];  Service: Gastroenterology;  Laterality: N/A;   EYE SURGERY Left    had to have eye flushed after getting costic sodium in L eye   HEMOSTASIS CLIP PLACEMENT  11/29/2019   Procedure: HEMOSTASIS CLIP PLACEMENT;  Surgeon: Irving Copas., MD;  [facility]: [facility];  Service: Gastroenterology;;   HERNIA REPAIR     HERNIA REPAIR     35 years ago   HOT HEMOSTASIS N/A 11/29/2019   Procedure: HOT HEMOSTASIS (ARGON PLASMA COAGULATION/BICAP);  Surgeon: Irving Copas., MD;  Location: [facility];  Service: Gastroenterology;  Laterality: N/A;   LEFT HEART CATH AND CORONARY ANGIOGRAPHY N/A 04/11/2019   Procedure: LEFT HEART CATH AND CORONARY ANGIOGRAPHY;  Surgeon: Adrian Prows, MD;  Location: [place] [facility];  Service: Cardiovascular;  Laterality: N/A;   POLYPECTOMY  11/29/2019   Procedure: POLYPECTOMY;  Surgeon: Rush Landmark Telford Nab., MD;  Location: [facility];  Service: Gastroenterology;;   RENAL ANGIOGRAPHY Bilateral 04/11/2019   Procedure: RENAL ANGIOGRAPHY;  Surgeon: Adrian Prows, MD;  Location: [facility];  Service: Cardiovascular;  Laterality: Bilateral;   SUBMUCOSAL LIFTING INJECTION  11/29/2019   Procedure: SUBMUCOSAL LIFTING INJECTION;  Surgeon: Irving Copas., MD;  Location: [facility];  Service: Gastroenterology;;   TOTAL KNEE ARTHROPLASTY Left 08/01/2019   Procedure: LEFT TOTAL KNEE ARTHROPLASTY;  Surgeon: Meredith Pel, MD;  Location: [facility];  Service: Orthopedics;  Laterality: Left;    There were no vitals filed for this visit.   Subjective Assessment - 03/18/21 0933     Subjective Patient reports no issues.    Currently in Pain? No/denies  [place] Adult PT Treatment/Exercise - 03/18/21 0001       Knee/Hip Exercises: Aerobic   Stationary Bike L5 x 5 minutes    Elliptical Attempted to pedal. Patient became SOB and reported legs burning after approximately 30 sec. After rest, tried again, but he could not continue.      Knee/Hip Exercises: Machines for Strengthening   Cybex Knee Extension 45# 2 x 15    Cybex Knee Flexion 35# 2 x 15    Cybex Leg Press 80# 2 x 15      Knee/Hip Exercises: Standing   Other Standing Knee  Exercises Quick alterante taps on 6" step forward and then to each side. Patient fatigued by the end of each set.      Knee/Hip Exercises: Supine   Bridges Strengthening;1 set;10 reps    Bridges Limitations Then performed bridege, rolling ball side to side x 3 each direction, 10 reps.    Single Leg Bridge Strengthening;Both;1 set;5 reps    Other Supine Knee/Hip Exercises Bridge with knee flexion/ball roll ups, 2 x 10 reps                     PT Education - 03/18/21 1006     Education Details Therapist educated patient to benefit of retraining the motor coordination to improve balance, and the importance of performing HEP to facilitatate this.    Person(s) Educated Patient    Methods Explanation;Demonstration    Comprehension Verbalized understanding;Returned demonstration              PT Short Term Goals - 03/18/21 1013       PT SHORT TERM GOAL #1   Title independent with initial HEP    Baseline Patient encouraged to perofrm HEP.    Time 1    Period Weeks    Status On-going               PT Long Term Goals - 03/04/21 0953       PT LONG TERM GOAL #1   Title Pt will be I and compliant with HEP.    Time 12    Period Weeks    Status New      PT LONG TERM GOAL #2   Title Pt will improve right hip/knee strength to 5/5 MMT tested in sitting to improve overall funciton    Time 12    Period Weeks    Status New      PT LONG TERM GOAL #3   Title improve berg balance score to 47/56    Time 12    Period Weeks    Status New      PT LONG TERM GOAL #4   Title increase DGI score to 19    Baseline 13    Time 12    Period Weeks    Status New                   Plan - 03/18/21 1001     Clinical Impression Statement Patient feels like he is still unsteady. He has not been performing most of his HEP, will try to be more consistent. Therapist educated him to benefit of retraining the motor coordination to improve balance. Treatment focused on  functional strengthening, he participated well.    Stability/Clinical Decision Making Evolving/Moderate complexity    Rehab Potential Good    PT Frequency 2x / week    PT Duration 12 weeks    PT Treatment/Interventions ADLs/Self Care  Home Management;Gait training;Neuromuscular re-education;Balance training;Therapeutic exercise;Therapeutic activities;Functional mobility training;Stair training;Patient/family education;Manual techniques    PT Next Visit Plan start gym and balance activities    PT Home Exercise Plan [id number]    Consulted and Agree with Plan of Care Patient             Patient will benefit from skilled therapeutic intervention in order to improve the following deficits and impairments:  Abnormal gait, Decreased coordination, Decreased range of motion, Difficulty walking, Dizziness, Decreased activity tolerance, Decreased balance, Decreased strength, Decreased mobility  Visit Diagnosis: Hemiplegia and hemiparesis following cerebral infarction affecting right dominant side ([facility])  Other lack of coordination  Other symptoms and signs involving the nervous system  Muscle weakness (generalized)  Unsteadiness on feet  Difficulty in walking, not elsewhere classified  Other abnormalities of gait and mobility     Problem List Patient Active Problem List   Diagnosis Date Noted   AKI (acute kidney injury) ([place]) 02/24/2021   Acute CVA (cerebrovascular accident) ([place]) 02/19/2021   Hyperlipidemia 02/19/2021   Anemia 10/12/2019   Cecal polyp 09/30/2019   History of colon polyps 09/30/2019   Abnormal colonoscopy 09/30/2019   Arthritis of left knee 08/01/2019   Type 2 diabetes mellitus without complication, without long-term current use of insulin ([place]) 05/01/2019   Dyspnea on exertion 04/10/2019   Abnormal nuclear stress test 04/10/2019   Essential hypertension 02/27/2019   Acute pain of left knee 02/27/2019    Marcelina Morel, DPT 03/18/2021, 10:14 AM  [place]. [place], [place], [address] Phone: [phone]   Fax:  [phone]  Name: Zamauri Nez MRN: [id number] Date of Birth: [birth year]

## 2021-03-19 NOTE — Therapy (Signed)
[place] [place], [place], [address] Phone: [phone]   Fax:  [phone]  Speech Language Pathology Treatment  Patient Details  Name: Joe Stewart MRN: [id number] Date of Birth: 1955/09/19 Referring Provider (SLP): Bonnielee Haff, MD   Encounter Date: 03/18/2021   End of Session - 03/18/21 1017     Visit Number 4    Number of Visits 17    Date for SLP Re-Evaluation 06/01/21    SLP Start Time 1012    SLP Stop Time  1055    SLP Time Calculation (min) 43 min    Activity Tolerance Patient tolerated treatment well             Past Medical History:  Diagnosis Date   Arthritis    Cataract    Mixed OU   Diabetes mellitus without complication ([place])    Diabetic retinopathy ([place])    NPDR OU   Hyperlipidemia    Hypertension    Hypertensive retinopathy    OU   Obesity     Past Surgical History:  Procedure Laterality Date   BIOPSY  11/29/2019   Procedure: BIOPSY;  Surgeon: Irving Copas., MD;  Location: [facility];  Service: Gastroenterology;;   COLONOSCOPY WITH PROPOFOL N/A 11/29/2019   Procedure: COLONOSCOPY WITH PROPOFOL;  Surgeon: Irving Copas., MD;  Location: [facility];  Service: Gastroenterology;  Laterality: N/A;   ENDOSCOPIC MUCOSAL RESECTION N/A 11/29/2019   Procedure: ENDOSCOPIC MUCOSAL RESECTION;  Surgeon: Rush Landmark Telford Nab., MD;  [facility];  Service: Gastroenterology;  Laterality: N/A;   ESOPHAGOGASTRODUODENOSCOPY (EGD) WITH PROPOFOL N/A 11/29/2019   Procedure: ESOPHAGOGASTRODUODENOSCOPY (EGD) WITH PROPOFOL;  Surgeon: Rush Landmark Telford Nab., MD;  [facility];  Service: Gastroenterology;  Laterality: N/A;   EYE SURGERY Left    had to have eye flushed after getting costic sodium in L eye   HEMOSTASIS CLIP PLACEMENT  11/29/2019   Procedure: HEMOSTASIS CLIP PLACEMENT;  Surgeon: Irving Copas., MD;  [facility];  Service:  Gastroenterology;;   HERNIA REPAIR     HERNIA REPAIR     35 years ago   HOT HEMOSTASIS N/A 11/29/2019   Procedure: HOT HEMOSTASIS (ARGON PLASMA COAGULATION/BICAP);  Surgeon: Irving Copas., MD;  Location: [facility];  Service: Gastroenterology;  Laterality: N/A;   LEFT HEART CATH AND CORONARY ANGIOGRAPHY N/A 04/11/2019   Procedure: LEFT HEART CATH AND CORONARY ANGIOGRAPHY;  Surgeon: Adrian Prows, MD;  Location: [facility];  Service: Cardiovascular;  Laterality: N/A;   POLYPECTOMY  11/29/2019   Procedure: POLYPECTOMY;  Surgeon: Rush Landmark Telford Nab., MD;  Location: [facility];  Service: Gastroenterology;;   RENAL ANGIOGRAPHY Bilateral 04/11/2019   Procedure: RENAL ANGIOGRAPHY;  Surgeon: Adrian Prows, MD;  Location: [facility];  Service: Cardiovascular;  Laterality: Bilateral;   SUBMUCOSAL LIFTING INJECTION  11/29/2019   Procedure: SUBMUCOSAL LIFTING INJECTION;  Surgeon: Irving Copas., MD;  Location: [facility];  Service: Gastroenterology;;   TOTAL KNEE ARTHROPLASTY Left 08/01/2019   Procedure: LEFT TOTAL KNEE ARTHROPLASTY;  Surgeon: Meredith Pel, MD;  Location: [facility];  Service: Orthopedics;  Laterality: Left;    There were no vitals filed for this visit.   Subjective Assessment - 03/18/21 1012     Subjective "The weekend went fast."    Currently in Pain? No/denies                   ADULT SLP TREATMENT - 03/19/21 1422  General Information   Behavior/Cognition Alert;Cooperative      Treatment Provided   Treatment provided Cognitive-Linquistic      Cognitive-Linquistic Treatment   Treatment focused on Cognition    Skilled Treatment Reviewed attention strategies, specifically for active listening during conversation. Provided pt with handout to assist with comprehension during phone conversations. SLP read aloud mock phone conversation/voicemail and tasked pt to write information down. Pt was only able to get 1-2 pieces of  information from 3-4 sentence voicemails. SLP edu on the importance of getting "who" is calling, "the callback number" and "purpose of conversation". With written prompts, pt was able to comprehend and write more information. Will provide communication strategies for pt's familiar partners and cont practice on message taking to increase memory/comprehension of auditory information.      Assessment / Recommendations / Plan   Plan Continue with current plan of care      Progression Toward Goals   Progression toward goals Progressing toward goals                SLP Short Term Goals - 03/04/21 1248       SLP SHORT TERM GOAL #1   Title Pt will increase auditory comprehension by recalling and verbalizing strategies to assist with active listening during conversation with minA.    Time 4    Period Weeks    Status New    Target Date 04/01/21      SLP SHORT TERM GOAL #2   Title Pt will comprehend functional memory or visual aids for recall of important information during structured conversations with minA.    Time 4    Period Weeks    Status New    Target Date 04/01/21      SLP SHORT TERM GOAL #3   Title Pt will complete BNT naming test to identify anomia.    Time 3    Period Weeks    Status New    Target Date 03/25/21              SLP Long Term Goals - 03/04/21 1249       SLP LONG TERM GOAL #1   Title Pt will increase auditory comprehension by recalling and verbalizing strategies to assist with active listening during  structured conversation independently.    Time 8    Period Weeks    Status New    Target Date 04/29/21      SLP LONG TERM GOAL #2   Title Pt will comprehend functional memory or visual aids for recall of important information during structured conversation independently    Time 8    Period Weeks    Status New    Target Date 04/29/21              Plan - 03/19/21 1426     Clinical Impression Statement See tx note.  Cont with current POC.     Speech Therapy Frequency 2x / week    Duration 8 weeks    Treatment/Interventions Language facilitation;Environmental controls;Cueing hierarchy;SLP instruction and feedback;Cognitive reorganization;Functional tasks;Compensatory strategies;Internal/external aids;Multimodal communcation approach;Patient/family education    Potential to Achieve Goals Good    Consulted and Agree with Plan of Care Patient;Family member/caregiver    Family Member Consulted Brother             Patient will benefit from skilled therapeutic intervention in order to improve the following deficits and impairments:   Cognitive communication deficit    Problem List Patient Active Problem List  Diagnosis Date Noted   AKI (acute kidney injury) ([place]) 02/24/2021   Acute CVA (cerebrovascular accident) ([place]) 02/19/2021   Hyperlipidemia 02/19/2021   Anemia 10/12/2019   Cecal polyp 09/30/2019   History of colon polyps 09/30/2019   Abnormal colonoscopy 09/30/2019   Arthritis of left knee 08/01/2019   Type 2 diabetes mellitus without complication, without long-term current use of insulin ([place]) 05/01/2019   Dyspnea on exertion 04/10/2019   Abnormal nuclear stress test 04/10/2019   Essential hypertension 02/27/2019   Acute pain of left knee 02/27/2019    Verdene Lennert, CCC-SLP 03/19/2021, 2:27 PM  [place] [place], [place], [address] Phone: [phone]   Fax:  [phone]   Name: Joe Stewart MRN: [id number] Date of Birth: Nov 02, 1955

## 2021-03-20 ENCOUNTER — Ambulatory Visit: Payer: Medicare Other | Admitting: Occupational Therapy

## 2021-03-20 ENCOUNTER — Encounter: Payer: Self-pay | Admitting: Speech Pathology

## 2021-03-20 ENCOUNTER — Encounter: Payer: Self-pay | Admitting: Physical Therapy

## 2021-03-20 ENCOUNTER — Encounter: Payer: Self-pay | Admitting: Occupational Therapy

## 2021-03-20 ENCOUNTER — Other Ambulatory Visit: Payer: Self-pay

## 2021-03-20 ENCOUNTER — Ambulatory Visit: Payer: Medicare Other | Admitting: Physical Therapy

## 2021-03-20 ENCOUNTER — Ambulatory Visit: Payer: Medicare Other | Admitting: Speech Pathology

## 2021-03-20 VITALS — BP 120/75 | HR 83

## 2021-03-20 DIAGNOSIS — R278 Other lack of coordination: Secondary | ICD-10-CM | POA: Diagnosis not present

## 2021-03-20 DIAGNOSIS — R41842 Visuospatial deficit: Secondary | ICD-10-CM

## 2021-03-20 DIAGNOSIS — R41841 Cognitive communication deficit: Secondary | ICD-10-CM

## 2021-03-20 DIAGNOSIS — R41840 Attention and concentration deficit: Secondary | ICD-10-CM

## 2021-03-20 DIAGNOSIS — M6281 Muscle weakness (generalized): Secondary | ICD-10-CM | POA: Diagnosis not present

## 2021-03-20 DIAGNOSIS — R29818 Other symptoms and signs involving the nervous system: Secondary | ICD-10-CM

## 2021-03-20 DIAGNOSIS — R2689 Other abnormalities of gait and mobility: Secondary | ICD-10-CM | POA: Diagnosis not present

## 2021-03-20 DIAGNOSIS — M25662 Stiffness of left knee, not elsewhere classified: Secondary | ICD-10-CM | POA: Diagnosis not present

## 2021-03-20 DIAGNOSIS — I69351 Hemiplegia and hemiparesis following cerebral infarction affecting right dominant side: Secondary | ICD-10-CM

## 2021-03-20 DIAGNOSIS — R262 Difficulty in walking, not elsewhere classified: Secondary | ICD-10-CM | POA: Diagnosis not present

## 2021-03-20 DIAGNOSIS — R2681 Unsteadiness on feet: Secondary | ICD-10-CM | POA: Diagnosis not present

## 2021-03-20 NOTE — Therapy (Signed)
[place] [place], [place], [address] Phone: [phone]   Fax:  [phone]  Physical Therapy Treatment  Patient Details  Name: Yi Haugan MRN: [id number] Date of Birth: 08-Feb-1955 No data recorded  Encounter Date: 03/20/2021   PT End of Session - 03/20/21 1013     Visit Number 5    Date for PT Re-Evaluation 06/01/21    Authorization Type [employer]    PT Start Time 0933    PT Stop Time 1012    PT Time Calculation (min) 39 min    Activity Tolerance Patient tolerated treatment well    Behavior During Therapy [facility] for tasks assessed/performed             Past Medical History:  Diagnosis Date   Arthritis    Cataract    Mixed OU   Diabetes mellitus without complication ([place])    Diabetic retinopathy ([place])    NPDR OU   Hyperlipidemia    Hypertension    Hypertensive retinopathy    OU   Obesity     Past Surgical History:  Procedure Laterality Date   BIOPSY  11/29/2019   Procedure: BIOPSY;  Surgeon: Irving Copas., MD;  Location: [facility];  Service: Gastroenterology;;   COLONOSCOPY WITH PROPOFOL N/A 11/29/2019   Procedure: COLONOSCOPY WITH PROPOFOL;  Surgeon: Irving Copas., MD;  [facility];  Service: Gastroenterology;  Laterality: N/A;   ENDOSCOPIC MUCOSAL RESECTION N/A 11/29/2019   Procedure: ENDOSCOPIC MUCOSAL RESECTION;  Surgeon: Rush Landmark Telford Nab., MD;  [facility];  Service: Gastroenterology;  Laterality: N/A;   ESOPHAGOGASTRODUODENOSCOPY (EGD) WITH PROPOFOL N/A 11/29/2019   Procedure: ESOPHAGOGASTRODUODENOSCOPY (EGD) WITH PROPOFOL;  Surgeon: Rush Landmark Telford Nab., MD;  [facility];  Service: Gastroenterology;  Laterality: N/A;   EYE SURGERY Left    had to have eye flushed after getting costic sodium in L eye   HEMOSTASIS CLIP PLACEMENT  11/29/2019   Procedure: HEMOSTASIS CLIP PLACEMENT;  Surgeon: Irving Copas., MD;  [facility];  Service: Gastroenterology;;   HERNIA REPAIR     HERNIA REPAIR     35 years ago   HOT HEMOSTASIS N/A 11/29/2019   Procedure: HOT HEMOSTASIS (ARGON PLASMA COAGULATION/BICAP);  Surgeon: Irving Copas., MD;  Location: [facility];  Service: Gastroenterology;  Laterality: N/A;   LEFT HEART CATH AND CORONARY ANGIOGRAPHY N/A 04/11/2019   Procedure: LEFT HEART CATH AND CORONARY ANGIOGRAPHY;  Surgeon: Adrian Prows, MD;  Location: [facility];  Service: Cardiovascular;  Laterality: N/A;   POLYPECTOMY  11/29/2019   Procedure: POLYPECTOMY;  Surgeon: Rush Landmark Telford Nab., MD;  Location: [facility];  Service: Gastroenterology;;   RENAL ANGIOGRAPHY Bilateral 04/11/2019   Procedure: RENAL ANGIOGRAPHY;  Surgeon: Adrian Prows, MD;  Location: [facility];  Service: Cardiovascular;  Laterality: Bilateral;   SUBMUCOSAL LIFTING INJECTION  11/29/2019   Procedure: SUBMUCOSAL LIFTING INJECTION;  Surgeon: Irving Copas., MD;  Location: [facility];  Service: Gastroenterology;;   TOTAL KNEE ARTHROPLASTY Left 08/01/2019   Procedure: LEFT TOTAL KNEE ARTHROPLASTY;  Surgeon: Meredith Pel, MD;  Location: [facility];  Service: Orthopedics;  Laterality: Left;    There were no vitals filed for this visit.   Subjective Assessment - 03/20/21 0936     Subjective No issues, sometimes it feels like my right ankle wants to roll in. Might have injured it years ago, can't remember. Trying to do better with doing HEP more regularly.  Patient Stated Goals walk better, be more independent, have better balance, drive    Currently in Pain? No/denies                               [facility] Adult PT Treatment/Exercise - 03/20/21 0001       Knee/Hip Exercises: Aerobic   Stationary Bike L5 progressing to L6  x6 minutes BLEs/BUEs   goal of 60-70spm     Knee/Hip Exercises: Standing   Heel Raises Both;1 set;15 reps    Heel Raises Limitations heel and toe raises     Lateral Step Up Both;1 set;15 reps    Lateral Step Up Limitations L LE first, then R LE    Forward Step Up Both;1 set;15 reps;Hand Hold: 2;Step Height: 4"    Forward Step Up Limitations L LE first, then R LE    Other Standing Knee Exercises side steps with red TB followed by 4 alternating step taps x10 rounds      Knee/Hip Exercises: Seated   Sit to Sand 1 set;10 reps;without UE support   red TB above knees                Balance Exercises - 03/20/21 0001       Balance Exercises: Standing   Tandem Stance Eyes open;Foam/compliant surface;3 reps;20 secs    Tandem Gait Forward;Retro;4 reps   in // bars               PT Education - 03/20/21 1013     Education Details exercise forom and purpose    Person(s) Educated Patient    Methods Explanation    Comprehension Verbalized understanding              PT Short Term Goals - 03/18/21 1013       PT SHORT TERM GOAL #1   Title independent with initial HEP    Baseline Patient encouraged to perofrm HEP.    Time 1    Period Weeks    Status On-going               PT Long Term Goals - 03/04/21 0953       PT LONG TERM GOAL #1   Title Pt will be I and compliant with HEP.    Time 12    Period Weeks    Status New      PT LONG TERM GOAL #2   Title Pt will improve right hip/knee strength to 5/5 MMT tested in sitting to improve overall funciton    Time 12    Period Weeks    Status New      PT LONG TERM GOAL #3   Title improve berg balance score to 47/56    Time 12    Period Weeks    Status New      PT LONG TERM GOAL #4   Title increase DGI score to 19    Baseline 13    Time 12    Period Weeks    Status New                   Plan - 03/20/21 1013     Clinical Impression [place] arrives today doing well, still wants to focus on general strength and balance. Started on Nustep for reciprocal motion training and general endurance, otherwise spent time focusing on combination of  strength and balance training as tolerated. Does seem  to have a little harder time with R LE proprioception and effective motor control, needed occasional cues for form here during session. Sounds like he is doing better with HEP. Will continue to progress as tolerated moving forward.    Stability/Clinical Decision Making Evolving/Moderate complexity    Clinical Decision Making Low    Rehab Potential Good    PT Frequency 2x / week    PT Duration 12 weeks    PT Treatment/Interventions ADLs/Self Care Home Management;Gait training;Neuromuscular re-education;Balance training;Therapeutic exercise;Therapeutic activities;Functional mobility training;Stair training;Patient/family education;Manual techniques    PT Next Visit Plan continue strenght/endurance/balance work    PT Home Exercise Plan [id number]    Consulted and Agree with Plan of Care Patient             Patient will benefit from skilled therapeutic intervention in order to improve the following deficits and impairments:  Abnormal gait, Decreased coordination, Decreased range of motion, Difficulty walking, Dizziness, Decreased activity tolerance, Decreased balance, Decreased strength, Decreased mobility  Visit Diagnosis: Muscle weakness (generalized)  Other symptoms and signs involving the nervous system     Problem List Patient Active Problem List   Diagnosis Date Noted   AKI (acute kidney injury) ([place]) 02/24/2021   Acute CVA (cerebrovascular accident) ([place]) 02/19/2021   Hyperlipidemia 02/19/2021   Anemia 10/12/2019   Cecal polyp 09/30/2019   History of colon polyps 09/30/2019   Abnormal colonoscopy 09/30/2019   Arthritis of left knee 08/01/2019   Type 2 diabetes mellitus without complication, without long-term current use of insulin ([place]) 05/01/2019   Dyspnea on exertion 04/10/2019   Abnormal nuclear stress test 04/10/2019   Essential hypertension 02/27/2019   Acute pain of left knee 02/27/2019   Ann Lions PT, DPT, PN2    Supplemental Physical Therapist [place]. [place], [place], [address] Phone: [phone]   Fax:  [phone]  Name: Talbot Monarch MRN: [id number] Date of Birth: September 29, 1955

## 2021-03-20 NOTE — Therapy (Signed)
[facility] [place], [place], [address] Phone: [phone]   Fax:  [phone]  Speech Language Pathology Treatment  Patient Details  Name: Joe Stewart MRN: [id number] Date of Birth: 04-05-1955 Referring Provider (SLP): Bonnielee Haff, MD   Encounter Date: 03/20/2021   End of Session - 03/20/21 1638     Visit Number 5    Number of Visits 17    Date for SLP Re-Evaluation 06/01/21    SLP Start Time 60    SLP Stop Time  1145    SLP Time Calculation (min) 45 min    Activity Tolerance Patient tolerated treatment well             Past Medical History:  Diagnosis Date   Arthritis    Cataract    Mixed OU   Diabetes mellitus without complication ([place])    Diabetic retinopathy ([place])    NPDR OU   Hyperlipidemia    Hypertension    Hypertensive retinopathy    OU   Obesity     Past Surgical History:  Procedure Laterality Date   BIOPSY  11/29/2019   Procedure: BIOPSY;  Surgeon: Irving Copas., MD;  Location: [facility];  Service: Gastroenterology;;   COLONOSCOPY WITH PROPOFOL N/A 11/29/2019   Procedure: COLONOSCOPY WITH PROPOFOL;  Surgeon: Irving Copas., MD;  Location: [facility];  Service: Gastroenterology;  Laterality: N/A;   ENDOSCOPIC MUCOSAL RESECTION N/A 11/29/2019   Procedure: ENDOSCOPIC MUCOSAL RESECTION;  Surgeon: Rush Landmark Telford Nab., MD;  [facility];  Service: Gastroenterology;  Laterality: N/A;   ESOPHAGOGASTRODUODENOSCOPY (EGD) WITH PROPOFOL N/A 11/29/2019   Procedure: ESOPHAGOGASTRODUODENOSCOPY (EGD) WITH PROPOFOL;  Surgeon: Rush Landmark Telford Nab., MD;  [facility];  Service: Gastroenterology;  Laterality: N/A;   EYE SURGERY Left    had to have eye flushed after getting costic sodium in L eye   HEMOSTASIS CLIP PLACEMENT  11/29/2019   Procedure: HEMOSTASIS CLIP PLACEMENT;  Surgeon: Irving Copas., MD;  [facility];  Service:  Gastroenterology;;   HERNIA REPAIR     HERNIA REPAIR     35 years ago   HOT HEMOSTASIS N/A 11/29/2019   Procedure: HOT HEMOSTASIS (ARGON PLASMA COAGULATION/BICAP);  Surgeon: Irving Copas., MD;  Location: [facility];  Service: Gastroenterology;  Laterality: N/A;   LEFT HEART CATH AND CORONARY ANGIOGRAPHY N/A 04/11/2019   Procedure: LEFT HEART CATH AND CORONARY ANGIOGRAPHY;  Surgeon: Adrian Prows, MD;  Location: [facility];  Service: Cardiovascular;  Laterality: N/A;   POLYPECTOMY  11/29/2019   Procedure: POLYPECTOMY;  Surgeon: Rush Landmark Telford Nab., MD;  Location: [facility];  Service: Gastroenterology;;   RENAL ANGIOGRAPHY Bilateral 04/11/2019   Procedure: RENAL ANGIOGRAPHY;  Surgeon: Adrian Prows, MD;  Location: [facility];  Service: Cardiovascular;  Laterality: Bilateral;   SUBMUCOSAL LIFTING INJECTION  11/29/2019   Procedure: SUBMUCOSAL LIFTING INJECTION;  Surgeon: Irving Copas., MD;  Location: [facility];  Service: Gastroenterology;;   TOTAL KNEE ARTHROPLASTY Left 08/01/2019   Procedure: LEFT TOTAL KNEE ARTHROPLASTY;  Surgeon: Meredith Pel, MD;  Location: [facility];  Service: Orthopedics;  Laterality: Left;    There were no vitals filed for this visit.   Subjective Assessment - 03/20/21 1638     Subjective "I can't remember."    Currently in Pain? No/denies                   ADULT SLP TREATMENT - 03/20/21 1639  General Information   Behavior/Cognition Alert;Cooperative      Treatment Provided   Treatment provided Cognitive-Linquistic      Cognitive-Linquistic Treatment   Treatment focused on Cognition    Skilled Treatment Pt was unable to recall any of previous session given modA cueing. SLP reviewed previous session's edu and tasks with patient. SLP edu on how repetition is a very powerful memory strategy and we will continue to review things as often as we need to for him to be able to retain information. Discussed the  importance of sharing communication strategies with family so they can assist during conversation. SLP wrote notes on each paper to assist with reminding pt to share information. SLP encouraged pt to set a routine to review exercises provided by PT, OT, and ST.      Assessment / Recommendations / Plan   Plan Continue with current plan of care      Progression Toward Goals   Progression toward goals Progressing toward goals                SLP Short Term Goals - 03/04/21 1248       SLP SHORT TERM GOAL #1   Title Pt will increase auditory comprehension by recalling and verbalizing strategies to assist with active listening during conversation with minA.    Time 4    Period Weeks    Status New    Target Date 04/01/21      SLP SHORT TERM GOAL #2   Title Pt will comprehend functional memory or visual aids for recall of important information during structured conversations with minA.    Time 4    Period Weeks    Status New    Target Date 04/01/21      SLP SHORT TERM GOAL #3   Title Pt will complete BNT naming test to identify anomia.    Time 3    Period Weeks    Status New    Target Date 03/25/21              SLP Long Term Goals - 03/04/21 1249       SLP LONG TERM GOAL #1   Title Pt will increase auditory comprehension by recalling and verbalizing strategies to assist with active listening during  structured conversation independently.    Time 8    Period Weeks    Status New    Target Date 04/29/21      SLP LONG TERM GOAL #2   Title Pt will comprehend functional memory or visual aids for recall of important information during structured conversation independently    Time 8    Period Weeks    Status New    Target Date 04/29/21              Plan - 03/20/21 1639     Clinical Impression Statement See tx note.  Cont with current POC.    Speech Therapy Frequency 2x / week    Duration 8 weeks    Treatment/Interventions Language facilitation;Environmental  controls;Cueing hierarchy;SLP instruction and feedback;Cognitive reorganization;Functional tasks;Compensatory strategies;Internal/external aids;Multimodal communcation approach;Patient/family education    Potential to Achieve Goals Good    Consulted and Agree with Plan of Care Patient;Family member/caregiver    Family Member Consulted Brother             Patient will benefit from skilled therapeutic intervention in order to improve the following deficits and impairments:   Cognitive communication deficit    Problem List Patient Active  Problem List   Diagnosis Date Noted   AKI (acute kidney injury) ([place]) 02/24/2021   Acute CVA (cerebrovascular accident) ([place]) 02/19/2021   Hyperlipidemia 02/19/2021   Anemia 10/12/2019   Cecal polyp 09/30/2019   History of colon polyps 09/30/2019   Abnormal colonoscopy 09/30/2019   Arthritis of left knee 08/01/2019   Type 2 diabetes mellitus without complication, without long-term current use of insulin ([place]) 05/01/2019   Dyspnea on exertion 04/10/2019   Abnormal nuclear stress test 04/10/2019   Essential hypertension 02/27/2019   Acute pain of left knee 02/27/2019    Verdene Lennert, CCC-SLP 03/20/2021, 4:42 PM  [place]. [place], [place], [address] Phone: [phone]   Fax:  [phone]   Name: Joe Stewart MRN: [id number] Date of Birth: 04-21-1955

## 2021-03-21 NOTE — Therapy (Signed)
[place]. [place], [place], [address] Phone: [phone]   Fax:  [phone]  Occupational Therapy Treatment  Patient Details  Name: Joe Stewart MRN: [id number] Date of Birth: 1955/12/17 Referring Provider (OT): Bonnielee Haff, MD   Encounter Date: 03/20/2021   OT End of Session - 03/20/21 1017     Visit Number 5    Number of Visits 17    Date for OT Re-Evaluation 05/27/21    Authorization Type [employer]    OT Start Time 1015    OT Stop Time 1055    OT Time Calculation (min) 40 min    Activity Tolerance Patient tolerated treatment well    Behavior During Therapy WFL for tasks assessed/performed            Past Medical History:  Diagnosis Date   Arthritis    Cataract    Mixed OU   Diabetes mellitus without complication ([place])    Diabetic retinopathy ([place])    NPDR OU   Hyperlipidemia    Hypertension    Hypertensive retinopathy    OU   Obesity     Past Surgical History:  Procedure Laterality Date   BIOPSY  11/29/2019   Procedure: BIOPSY;  Surgeon: Irving Copas., MD;  Location: [facility];  Service: Gastroenterology;;   COLONOSCOPY WITH PROPOFOL N/A 11/29/2019   Procedure: COLONOSCOPY WITH PROPOFOL;  Surgeon: Irving Copas., MD;  Location: [facility];  Service: Gastroenterology;  Laterality: N/A;   ENDOSCOPIC MUCOSAL RESECTION N/A 11/29/2019   Procedure: ENDOSCOPIC MUCOSAL RESECTION;  Surgeon: Rush Landmark Telford Nab., MD;  Location: [facility];  Service: Gastroenterology;  Laterality: N/A;   ESOPHAGOGASTRODUODENOSCOPY (EGD) WITH PROPOFOL N/A 11/29/2019   Procedure: ESOPHAGOGASTRODUODENOSCOPY (EGD) WITH PROPOFOL;  Surgeon: Rush Landmark Telford Nab., MD;  Location: [facility];  Service: Gastroenterology;  Laterality: N/A;   EYE SURGERY Left    had to have eye flushed after getting costic sodium in L eye   HEMOSTASIS CLIP PLACEMENT  11/29/2019   Procedure:  HEMOSTASIS CLIP PLACEMENT;  Surgeon: Irving Copas., MD;  [facility]: [facility];  Service: Gastroenterology;;   HERNIA REPAIR     HERNIA REPAIR     35 years ago   HOT HEMOSTASIS N/A 11/29/2019   Procedure: HOT HEMOSTASIS (ARGON PLASMA COAGULATION/BICAP);  Surgeon: Irving Copas., MD;  Location: [facility];  Service: Gastroenterology;  Laterality: N/A;   LEFT HEART CATH AND CORONARY ANGIOGRAPHY N/A 04/11/2019   Procedure: LEFT HEART CATH AND CORONARY ANGIOGRAPHY;  Surgeon: Adrian Prows, MD;  Location: [facility];  Service: Cardiovascular;  Laterality: N/A;   POLYPECTOMY  11/29/2019   Procedure: POLYPECTOMY;  Surgeon: Rush Landmark Telford Nab., MD;  Location: [facility];  Service: Gastroenterology;;   RENAL ANGIOGRAPHY Bilateral 04/11/2019   Procedure: RENAL ANGIOGRAPHY;  Surgeon: Adrian Prows, MD;  Location: [facility];  Service: Cardiovascular;  Laterality: Bilateral;   SUBMUCOSAL LIFTING INJECTION  11/29/2019   Procedure: SUBMUCOSAL LIFTING INJECTION;  Surgeon: Irving Copas., MD;  Location: [facility];  Service: Gastroenterology;;   TOTAL KNEE ARTHROPLASTY Left 08/01/2019   Procedure: LEFT TOTAL KNEE ARTHROPLASTY;  Surgeon: Meredith Pel, MD;  Location: [facility];  Service: Orthopedics;  Laterality: Left;    Vitals:   03/20/21 1051  BP: 120/75  Pulse: 83     Subjective Assessment - 03/20/21 1016     Subjective  Pt states he does not remember completing TMT: Part B in previous session   Patient  is accompanied by: Family member   [name] (brother)   Pertinent History 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 No/denies             Treatment/Exercises - 03/20/21    Attention Activities Alternating attention trail-making activity involving pt connecting dots/symbols in order of the colors/symbols listed on the page. First set completed w/ 12 objects (good self-monitoring, no errors, slightly extended time); second set completed w/ 30 colors (good self-monitoring, 1 error, slightly extended time). Due to success, OT graded activity up to alternating between numbers and letters w/ pt requiring significantly extended time for completion; no errors. Pt also completed symbol digit test to incorporate handwriting w/ pt demonstrating good legibility and smoothness of lines; OT provided occasional cues to facilitate correction of errors.    Weight Bearing Weight bearing through [place] while standing at countertop for increased weight and proprioceptive input over affected RUE; completed 2 sets of 10 forward weight shifts w/ OT providing occasional cues for equal activation     Maze Activity Completed simple and then intermediate tabletop mazes. Able to complete simple maze w/ Min A requiring verbal and visual cues for problem-solving; completed intermediate maze w/ Min A, again requiring cues for problem-solving    IADLs: Paramedic Activity involving looking at a sample grocery list and separating items by category/area to address organization of thought, planning, alternating attention, and working memory. Success improved as activity progressed w/ pt initially requiring increased breakdown of task and then occ min cues            OT Short Term Goals - 03/14/21 1004       OT SHORT TERM GOAL #1   Title Pt will verbalize understanding of visual compensatory strategies to incorporate during tabletop/near visual acuity activities    Baseline No visual compensatory strategies    Time 2    Period Weeks    Status Achieved   03/11/21   Target Date 03/18/21      OT  SHORT TERM GOAL #2   Title Pt will be able to complete symbol cancellation task, using AE/compensatory strategies prn w/ 100% accuracy    Baseline Completed 50% of page w/ 14/15 correct    Time 4    Period Weeks    Status Achieved   03/14/21 - 24/24 using line guide   Target Date 04/01/21             OT Long Term Goals - 03/18/21 1125       OT LONG TERM GOAL #1   Title Pt will demonstrate improved participation in functional FM tasks as evidenced by decreasing 9-HPT time by at least 5 seconds w/ R, dominant hand    Baseline 44 sec (L hand 36 sec)    Time 8    Period Weeks    Status Achieved   03/18/21 - 35 sec   Target Date 04/29/21      OT LONG TERM GOAL #2   Title Pt will be able to sort medications (simulated or actual) correctly, using AE/compensatory strategies prn    Baseline Not currently managing medication  Time 8    Period Weeks    Status On-going    Target Date 04/29/21      OT LONG TERM GOAL #3   Title Pt will be able to complete simulated meal prep activity w/ Mod I    Baseline Not currently participating in meal prep    Time 8    Period Weeks    Status On-going    Target Date 04/29/21      OT LONG TERM GOAL #4   Title Pt will demonstrate independence w/ compensatory strategies, including AE prn, to improve handwriting legibility and efficiency    Baseline Fair legibility; tremor observed    Time 8    Period Weeks    Status On-going    Target Date 04/29/21      OT LONG TERM GOAL #5   Title Pt will be able to complete TMT: Part B in less than 1 min, 30 sec to indicate potential appropriateness for initiating return to driving    Baseline Over 1 min, 30 sec    Time 8    Period Weeks    Status On-going    Target Date 04/29/21             Plan - 03/20/21 1018     Clinical Impression Statement Alternating attention activities (trail making, symbol-digit) incorporated this session to address attention, visual scanning and perception, working  memory, processing, and motor control/coordination. Pt did well w/ first activities, but demonstrating significantly increased difficulty when activity involved alternating between numbers and letters, suggesting increased difficulty w/ working memory and/or sequencing. Prior to completing maze activity and simulated IADL task, OT incorporated weight bearing while standing at countertop for increased weight through RUE to potentially decrease ataxia/tremors. No notable improvement observed. Maze and IADL task then completed to address higher level executive functioning as well as motor control and coordination.    OT Occupational Profile and History Detailed Assessment- Review of Records and additional review of physical, cognitive, psychosocial history related to current functional performance    Occupational performance deficits (Please refer to evaluation for details): ADL's;IADL's;Leisure;Social Participation    Body Structure / Function / Physical Skills ADL;Decreased knowledge of use of DME;Strength;Balance;Dexterity;GMC;UE functional use;IADL;Vision;Coordination;FMC;Decreased knowledge of precautions    Cognitive Skills Memory;Safety Awareness    Psychosocial Skills Environmental  Adaptations    Rehab Potential Good    Clinical Decision Making Limited treatment options, no task modification necessary    Comorbidities Affecting Occupational Performance: May have comorbidities impacting occupational performance    Modification or Assistance to Complete Evaluation  No modification of tasks or assist necessary to complete eval    OT Frequency 2x / week    OT Duration 8 weeks    OT Treatment/Interventions Self-care/ADL training;DME and/or AE instruction;Balance training;Therapeutic activities;Therapeutic exercise;Visual/perceptual remediation/compensation;Neuromuscular education;Energy conservation;Manual Therapy;Patient/family education;Cognitive remediation/compensation    Plan Med management / meal  prep activity   Recommended Other Services Currently receiving PT/ST services at this location    Consulted and Agree with Plan of Care Patient            Patient will benefit from skilled therapeutic intervention in order to improve the following deficits and impairments:   Body Structure / Function / Physical Skills: ADL, Decreased knowledge of use of DME, Strength, Balance, Dexterity, GMC, UE functional use, IADL, Vision, Coordination, FMC, Decreased knowledge of precautions Cognitive Skills: Memory, Safety Awareness Psychosocial Skills: Environmental  Adaptations   Visit Diagnosis: Attention and concentration deficit  Visuospatial deficit  Other  symptoms and signs involving the nervous system  Hemiplegia and hemiparesis following cerebral infarction affecting right dominant side [facility])  Other lack of coordination    Problem List Patient Active Problem List   Diagnosis Date Noted   AKI (acute kidney injury) ([place]) 02/24/2021   Acute CVA (cerebrovascular accident) ([place]) 02/19/2021   Hyperlipidemia 02/19/2021   Anemia 10/12/2019   Cecal polyp 09/30/2019   History of colon polyps 09/30/2019   Abnormal colonoscopy 09/30/2019   Arthritis of left knee 08/01/2019   Type 2 diabetes mellitus without complication, without long-term current use of insulin ([place]) 05/01/2019   Dyspnea on exertion 04/10/2019   Abnormal nuclear stress test 04/10/2019   Essential hypertension 02/27/2019   Acute pain of left knee 02/27/2019    Kathrine Cords, MSOT, OTR/L 03/20/2021, 5:01 PM  [place]. [place], [place], [address] Phone: [phone]   Fax:  [phone]  Name: Joe Stewart MRN: [id number] Date of Birth: 1955-09-10

## 2021-03-24 ENCOUNTER — Telehealth: Payer: Self-pay

## 2021-03-24 NOTE — Telephone Encounter (Signed)
Caller name:Adriano Damita Lack   On DPR? :Yes  Call back number:[phone]  Provider they see: Richard   Reason for call:Pt is seeing Rex Kras and does not like this cardiologist and wants referral to [facility] Dr Fayne Mediate  Pt was in office last week to see Delfino Lovett

## 2021-03-24 NOTE — Telephone Encounter (Signed)
Reason for call:Pt is seeing Sunit Tolia and does not like this cardiologist and wants referral to [facility] Dr Fayne Mediate  Pt was in office last week to see Delfino Lovett

## 2021-03-25 ENCOUNTER — Encounter: Payer: Self-pay | Admitting: Occupational Therapy

## 2021-03-25 ENCOUNTER — Other Ambulatory Visit: Payer: Self-pay

## 2021-03-25 ENCOUNTER — Other Ambulatory Visit: Payer: Self-pay | Admitting: Registered Nurse

## 2021-03-25 ENCOUNTER — Ambulatory Visit: Payer: Medicare Other | Admitting: Occupational Therapy

## 2021-03-25 ENCOUNTER — Encounter: Payer: Self-pay | Admitting: Speech Pathology

## 2021-03-25 ENCOUNTER — Ambulatory Visit: Payer: Medicare Other | Admitting: Physical Therapy

## 2021-03-25 ENCOUNTER — Encounter: Payer: Self-pay | Admitting: Physical Therapy

## 2021-03-25 ENCOUNTER — Ambulatory Visit: Payer: Medicare Other | Admitting: Speech Pathology

## 2021-03-25 DIAGNOSIS — R2689 Other abnormalities of gait and mobility: Secondary | ICD-10-CM

## 2021-03-25 DIAGNOSIS — M6281 Muscle weakness (generalized): Secondary | ICD-10-CM | POA: Diagnosis not present

## 2021-03-25 DIAGNOSIS — I10 Essential (primary) hypertension: Secondary | ICD-10-CM

## 2021-03-25 DIAGNOSIS — R41840 Attention and concentration deficit: Secondary | ICD-10-CM

## 2021-03-25 DIAGNOSIS — I639 Cerebral infarction, unspecified: Secondary | ICD-10-CM

## 2021-03-25 DIAGNOSIS — R2681 Unsteadiness on feet: Secondary | ICD-10-CM | POA: Diagnosis not present

## 2021-03-25 DIAGNOSIS — R278 Other lack of coordination: Secondary | ICD-10-CM

## 2021-03-25 DIAGNOSIS — I69351 Hemiplegia and hemiparesis following cerebral infarction affecting right dominant side: Secondary | ICD-10-CM | POA: Diagnosis not present

## 2021-03-25 DIAGNOSIS — R262 Difficulty in walking, not elsewhere classified: Secondary | ICD-10-CM | POA: Diagnosis not present

## 2021-03-25 DIAGNOSIS — E119 Type 2 diabetes mellitus without complications: Secondary | ICD-10-CM

## 2021-03-25 DIAGNOSIS — R29818 Other symptoms and signs involving the nervous system: Secondary | ICD-10-CM

## 2021-03-25 DIAGNOSIS — M25662 Stiffness of left knee, not elsewhere classified: Secondary | ICD-10-CM | POA: Diagnosis not present

## 2021-03-25 DIAGNOSIS — R41842 Visuospatial deficit: Secondary | ICD-10-CM

## 2021-03-25 DIAGNOSIS — R41841 Cognitive communication deficit: Secondary | ICD-10-CM

## 2021-03-25 NOTE — Telephone Encounter (Signed)
Placed referral  Thanks,  Denice Paradise

## 2021-03-25 NOTE — Therapy (Signed)
[place]. [place], [place], [address] Phone: [phone]   Fax:  [phone]  Physical Therapy Treatment  Patient Details  Name: Joe Stewart MRN: [id number] Date of Birth: Jan 12, 1956 No data recorded  Encounter Date: 03/25/2021   PT End of Session - 03/25/21 1148     Visit Number 6    Date for PT Re-Evaluation 06/01/21    Authorization Type [employer]    PT Start Time 1100    PT Stop Time 1143    PT Time Calculation (min) 43 min    Equipment Utilized During Treatment Gait belt    Activity Tolerance Patient tolerated treatment well    Behavior During Therapy WFL for tasks assessed/performed             Past Medical History:  Diagnosis Date   Arthritis    Cataract    Mixed OU   Diabetes mellitus without complication ([place])    Diabetic retinopathy ([place])    NPDR OU   Hyperlipidemia    Hypertension    Hypertensive retinopathy    OU   Obesity     Past Surgical History:  Procedure Laterality Date   BIOPSY  11/29/2019   Procedure: BIOPSY;  Surgeon: Irving Copas., MD;  Location: [facility];  Service: Gastroenterology;;   COLONOSCOPY WITH PROPOFOL N/A 11/29/2019   Procedure: COLONOSCOPY WITH PROPOFOL;  Surgeon: Irving Copas., MD;  Location: [facility];  Service: Gastroenterology;  Laterality: N/A;   ENDOSCOPIC MUCOSAL RESECTION N/A 11/29/2019   Procedure: ENDOSCOPIC MUCOSAL RESECTION;  Surgeon: Rush Landmark Telford Nab., MD;  [facility]: [facility];  Service: Gastroenterology;  Laterality: N/A;   ESOPHAGOGASTRODUODENOSCOPY (EGD) WITH PROPOFOL N/A 11/29/2019   Procedure: ESOPHAGOGASTRODUODENOSCOPY (EGD) WITH PROPOFOL;  Surgeon: Rush Landmark Telford Nab., MD;  Location: [facility];  Service: Gastroenterology;  Laterality: N/A;   EYE SURGERY Left    had to have eye flushed after getting costic sodium in L eye   HEMOSTASIS CLIP PLACEMENT  11/29/2019   Procedure: HEMOSTASIS CLIP PLACEMENT;   Surgeon: Irving Copas., MD;  [facility]: [facility];  Service: Gastroenterology;;   HERNIA REPAIR     HERNIA REPAIR     35 years ago   HOT HEMOSTASIS N/A 11/29/2019   Procedure: HOT HEMOSTASIS (ARGON PLASMA COAGULATION/BICAP);  Surgeon: Irving Copas., MD;  Location: [facility];  Service: Gastroenterology;  Laterality: N/A;   LEFT HEART CATH AND CORONARY ANGIOGRAPHY N/A 04/11/2019   Procedure: LEFT HEART CATH AND CORONARY ANGIOGRAPHY;  Surgeon: Adrian Prows, MD;  Location: [facility];  Service: Cardiovascular;  Laterality: N/A;   POLYPECTOMY  11/29/2019   Procedure: POLYPECTOMY;  Surgeon: Rush Landmark Telford Nab., MD;  Location: [facility];  Service: Gastroenterology;;   RENAL ANGIOGRAPHY Bilateral 04/11/2019   Procedure: RENAL ANGIOGRAPHY;  Surgeon: Adrian Prows, MD;  Location: [facility];  Service: Cardiovascular;  Laterality: Bilateral;   SUBMUCOSAL LIFTING INJECTION  11/29/2019   Procedure: SUBMUCOSAL LIFTING INJECTION;  Surgeon: Irving Copas., MD;  Location: [facility];  Service: Gastroenterology;;   TOTAL KNEE ARTHROPLASTY Left 08/01/2019   Procedure: LEFT TOTAL KNEE ARTHROPLASTY;  Surgeon: Meredith Pel, MD;  Location: [facility];  Service: Orthopedics;  Laterality: Left;    There were no vitals filed for this visit.   Subjective Assessment - 03/25/21 1100     Subjective Patient reports no changes in his status. He continues to perform his HEP, feels it is still challenging him.    Patient is  accompained by: Family member                [facility] PT Assessment - 03/25/21 0001       Dynamic Gait Index   Level Surface Normal    Change in Gait Speed Mild Impairment    Gait with Horizontal Head Turns Mild Impairment    Gait with Vertical Head Turns Mild Impairment    Gait and Pivot Turn Mild Impairment    Step Over Obstacle Mild Impairment    Step Around Obstacles Normal    Steps Mild Impairment    Total Score 18                            [facility] Adult PT Treatment/Exercise - 03/25/21 0001       Ambulation/Gait   Gait Comments Patient observed to hesitate at initiation of R swing. Fast walking x 75'. Patient demosntrated imporved fluidity, decreased R LE catch during initiation of swing.      Knee/Hip Exercises: Aerobic   Stationary Bike L4 x 4 minutes, then 2 x 30 seconds > 80 RPM with recovery in between      Knee/Hip Exercises: Plyometrics   Other Plyometric Exercises Jumping on mini tramp. Started with simple jumps, then jump into ABD/ADD, then into alternating staggered stance. Able to perform each for at least 15 seconds with fair control. Relied on BUE support and had more difficulty when trying to relax grip.    Other Plyometric Exercises Shuffle sid e to side, fair control, slower speed.                       PT Short Term Goals - 03/18/21 1013       PT SHORT TERM GOAL #1   Title independent with initial HEP    Baseline Patient encouraged to perofrm HEP.    Time 1    Period Weeks    Status On-going               PT Long Term Goals - 03/25/21 1148       PT LONG TERM GOAL #1   Title Pt will be I and compliant with HEP.    Time 12    Period Weeks    Status New      PT LONG TERM GOAL #2   Title Pt will improve right hip/knee strength to 5/5 MMT tested in sitting to improve overall funciton    Time 12    Period Weeks    Status New      PT LONG TERM GOAL #3   Title improve berg balance score to 47/56    Time 12    Period Weeks    Status New      PT LONG TERM GOAL #4   Title increase DGI score to 19    Baseline 18    Time 9    Period Weeks    Status On-going                   Plan - 03/25/21 1109     Clinical Impression Statement Patient reports no changes or issues. Therapsit noted a hesitation in RLE at initiation of swing during gait. Performed treatment activities to faciltiate automatic, fast movments and motor coordination to  improve control of RLE. Patient challenged, but participated well.    Stability/Clinical Decision Making Evolving/Moderate complexity    Clinical Decision Making  Low    Rehab Potential Good    PT Frequency 2x / week    PT Duration Other (comment)   9w   PT Treatment/Interventions ADLs/Self Care Home Management;Gait training;Neuromuscular re-education;Balance training;Therapeutic exercise;Therapeutic activities;Functional mobility training;Stair training;Patient/family education;Manual techniques    PT Next Visit Plan Motor control and coordination, speed of movement    PT Home Exercise Plan [id number]    Consulted and Agree with Plan of Care Patient             Patient will benefit from skilled therapeutic intervention in order to improve the following deficits and impairments:  Abnormal gait, Decreased coordination, Decreased range of motion, Difficulty walking, Dizziness, Decreased activity tolerance, Decreased balance, Decreased strength, Decreased mobility  Visit Diagnosis: Hemiplegia and hemiparesis following cerebral infarction affecting right dominant side ([facility])  Other symptoms and signs involving the nervous system  Other lack of coordination  Muscle weakness (generalized)  Unsteadiness on feet  Difficulty in walking, not elsewhere classified  Other abnormalities of gait and mobility  Stiffness of left knee, not elsewhere classified     Problem List Patient Active Problem List   Diagnosis Date Noted   AKI (acute kidney injury) ([place]) 02/24/2021   Acute CVA (cerebrovascular accident) ([place]) 02/19/2021   Hyperlipidemia 02/19/2021   Anemia 10/12/2019   Cecal polyp 09/30/2019   History of colon polyps 09/30/2019   Abnormal colonoscopy 09/30/2019   Arthritis of left knee 08/01/2019   Type 2 diabetes mellitus without complication, without long-term current use of insulin ([place]) 05/01/2019   Dyspnea on exertion 04/10/2019   Abnormal nuclear stress test 04/10/2019    Essential hypertension 02/27/2019   Acute pain of left knee 02/27/2019    Marcelina Morel, DPT 03/25/2021, 11:52 AM  [place]. [place], [place], [address] Phone: [phone]   Fax:  [phone]  Name: Joe Stewart MRN: [id number] Date of Birth: 1955-04-06

## 2021-03-25 NOTE — Therapy (Signed)
[facility] [place], [place], [address] Phone: [phone]   Fax:  [phone]  Occupational Therapy Treatment  Patient Details  Name: Joe Stewart MRN: [id number] Date of Birth: 1955-05-18 Referring Provider (OT): Bonnielee Haff, MD   Encounter Date: 03/25/2021   OT End of Session - 03/25/21 1020     Visit Number 6    Number of Visits 17    Date for OT Re-Evaluation 05/27/21    Authorization Type [employer]    OT Start Time 1017    OT Stop Time 1057    OT Time Calculation (min) 40 min    Activity Tolerance Patient tolerated treatment well    Behavior During Therapy WFL for tasks assessed/performed            Past Medical History:  Diagnosis Date   Arthritis    Cataract    Mixed OU   Diabetes mellitus without complication ([place])    Diabetic retinopathy ([place])    NPDR OU   Hyperlipidemia    Hypertension    Hypertensive retinopathy    OU   Obesity     Past Surgical History:  Procedure Laterality Date   BIOPSY  11/29/2019   Procedure: BIOPSY;  Surgeon: Irving Copas., MD;  Location: [facility];  Service: Gastroenterology;;   COLONOSCOPY WITH PROPOFOL N/A 11/29/2019   Procedure: COLONOSCOPY WITH PROPOFOL;  Surgeon: Irving Copas., MD;  Location: [facility];  Service: Gastroenterology;  Laterality: N/A;   ENDOSCOPIC MUCOSAL RESECTION N/A 11/29/2019   Procedure: ENDOSCOPIC MUCOSAL RESECTION;  Surgeon: Rush Landmark Telford Nab., MD;  Location: [facility];  Service: Gastroenterology;  Laterality: N/A;   ESOPHAGOGASTRODUODENOSCOPY (EGD) WITH PROPOFOL N/A 11/29/2019   Procedure: ESOPHAGOGASTRODUODENOSCOPY (EGD) WITH PROPOFOL;  Surgeon: Rush Landmark Telford Nab., MD;  Location: [facility];  Service: Gastroenterology;  Laterality: N/A;   EYE SURGERY Left    had to have eye flushed after getting costic sodium in L eye   HEMOSTASIS CLIP PLACEMENT  11/29/2019   Procedure:  HEMOSTASIS CLIP PLACEMENT;  Surgeon: Irving Copas., MD;  [facility]: [facility];  Service: Gastroenterology;;   HERNIA REPAIR     HERNIA REPAIR     35 years ago   HOT HEMOSTASIS N/A 11/29/2019   Procedure: HOT HEMOSTASIS (ARGON PLASMA COAGULATION/BICAP);  Surgeon: Irving Copas., MD;  Location: [facility];  Service: Gastroenterology;  Laterality: N/A;   LEFT HEART CATH AND CORONARY ANGIOGRAPHY N/A 04/11/2019   Procedure: LEFT HEART CATH AND CORONARY ANGIOGRAPHY;  Surgeon: Adrian Prows, MD;  Location: [facility];  Service: Cardiovascular;  Laterality: N/A;   POLYPECTOMY  11/29/2019   Procedure: POLYPECTOMY;  Surgeon: Rush Landmark Telford Nab., MD;  Location: [facility];  Service: Gastroenterology;;   RENAL ANGIOGRAPHY Bilateral 04/11/2019   Procedure: RENAL ANGIOGRAPHY;  Surgeon: Adrian Prows, MD;  Location: [facility];  Service: Cardiovascular;  Laterality: Bilateral;   SUBMUCOSAL LIFTING INJECTION  11/29/2019   Procedure: SUBMUCOSAL LIFTING INJECTION;  Surgeon: Irving Copas., MD;  Location: [facility];  Service: Gastroenterology;;   TOTAL KNEE ARTHROPLASTY Left 08/01/2019   Procedure: LEFT TOTAL KNEE ARTHROPLASTY;  Surgeon: Meredith Pel, MD;  Location: [facility];  Service: Orthopedics;  Laterality: Left;    There were no vitals filed for this visit.   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 No/denies             OT Treatment - 03/25/21    Card Shuffling  Practiced shuffling playing cards, both using jumbo cards and standard cards, focusing on coordinating movements for NMR of [place]. OT provided education and assisted w/ problem-solving to facilitate success during task.    Putty Exercises R hand lateral pinch, thumb-to-finger opposition along length of putty w/ index, middle, and ring/long fingers, MPJ flexion, and hook fist w/ resistance putty (red, med-soft) completed 10-15x each. Required initial cues for positioning w/ lateral pinch and MPJ flexion, but consistency improved w/ repetition. Required breakdown of task and min verbal/tactile cues for positioning w/ MPJ flexion and hook fist exercises in order to maintain wrist positioning in neutral and focus on isolation of targeted flexion of MPJ or IPJs respectively.            OT Short Term Goals - 03/14/21 1004       OT SHORT TERM GOAL #1   Title Pt will verbalize understanding of visual compensatory strategies to incorporate during tabletop/near visual acuity activities    Baseline No visual compensatory strategies    Time 2    Period Weeks    Status Achieved   03/11/21   Target Date 03/18/21      OT SHORT TERM GOAL #2   Title Pt will be able to complete symbol cancellation task, using AE/compensatory strategies prn w/ 100% accuracy    Baseline Completed 50% of page w/ 14/15 correct    Time 4    Period Weeks    Status Achieved   03/14/21 - 24/24 using line guide   Target Date 04/01/21             OT Long Term Goals - 03/18/21 1125       OT LONG TERM GOAL #1   Title Pt will demonstrate improved participation in functional FM tasks as evidenced by decreasing 9-HPT time by at least 5 seconds w/ R, dominant hand    Baseline 44 sec (L hand 36 sec)    Time 8    Period Weeks    Status Achieved   03/18/21 - 35 sec   Target Date 04/29/21      OT LONG TERM GOAL #2   Title Pt will be  able to sort medications (simulated or actual) correctly, using AE/compensatory strategies prn    Baseline Not currently managing medication    Time 8    Period Weeks    Status On-going    Target Date 04/29/21      OT LONG TERM GOAL #3   Title Pt will be able to complete simulated meal prep activity w/ Mod I    Baseline Not currently participating in meal prep    Time 8    Period Weeks    Status On-going    Target Date 04/29/21      OT LONG TERM GOAL #4   Title Pt will demonstrate independence w/ compensatory strategies, including AE prn, to improve handwriting  legibility and efficiency    Baseline Fair legibility; tremor observed    Time 8    Period Weeks    Status On-going    Target Date 04/29/21      OT LONG TERM GOAL #5   Title Pt will be able to complete TMT: Part B in less than 1 min, 30 sec to indicate potential appropriateness for initiating return to driving    Baseline Over 1 min, 30 sec    Time 8    Period Weeks    Status On-going    Target Date 04/29/21             Plan - 03/25/21 1020     Clinical Impression Statement Due to pt's report of difficulty shuffling cards due to weakness, OT assessed grip and pinch strength w/ dynamometer and pinch gauge. R hand grip was found to be 65 lbs, lateral pinch 9 lbs, and 3-pt pinch 10 lbs w/ L hand grip at 71 lbs, lateral pinch 13 lbs, and 3-pt pinch at 12 lbs. This indicated gross grip is WFL while R hand pinch/intrinsic hand strengthening is mildly limited. OT provided relevant condition-specific education, explaining deficits likely more related to sensory impairments and decreased control/coordination vs strength; pt verbalized understanding. To address pinch and intrinsic hand strengthening while additionally targeting functional FM coordination, OT introduced putty exercises w/ pt able to complete all w/out difficulty.    OT Occupational Profile and History Detailed Assessment- Review of Records and additional review of  physical, cognitive, psychosocial history related to current functional performance    Occupational performance deficits (Please refer to evaluation for details): ADL's;IADL's;Leisure;Social Participation    Body Structure / Function / Physical Skills ADL;Decreased knowledge of use of DME;Strength;Balance;Dexterity;GMC;UE functional use;IADL;Vision;Coordination;FMC;Decreased knowledge of precautions    Cognitive Skills Memory;Safety Awareness    Psychosocial Skills Environmental  Adaptations    Rehab Potential Good    Clinical Decision Making Limited treatment options, no task modification necessary    Comorbidities Affecting Occupational Performance: May have comorbidities impacting occupational performance    Modification or Assistance to Complete Evaluation  No modification of tasks or assist necessary to complete eval    OT Frequency 2x / week    OT Duration 8 weeks    OT Treatment/Interventions Self-care/ADL training;DME and/or AE instruction;Balance training;Therapeutic activities;Therapeutic exercise;Visual/perceptual remediation/compensation;Neuromuscular education;Energy conservation;Manual Therapy;Patient/family education;Cognitive remediation/compensation    Plan Med management / meal prep activity; review putty exercises prn    Recommended Other Services Currently receiving PT/ST services at this location    Consulted and Agree with Plan of Care Patient            Patient will benefit from skilled therapeutic intervention in order to improve the following deficits and impairments:   Body Structure / Function / Physical Skills: ADL, Decreased knowledge of use of DME, Strength, Balance, Dexterity, GMC, UE functional use, IADL, Vision, Coordination, FMC, Decreased knowledge of precautions Cognitive Skills: Memory, Safety Awareness Psychosocial Skills: Environmental  Adaptations   Visit Diagnosis: Hemiplegia and hemiparesis following cerebral infarction affecting right dominant  side (HCC)  Attention and concentration deficit  Visuospatial deficit  Other symptoms and signs involving the nervous system  Other lack of coordination   Problem List Patient Active Problem List   Diagnosis Date Noted   AKI (acute kidney injury) ([place]) 02/24/2021   Acute CVA (cerebrovascular accident) ([place]) 02/19/2021   Hyperlipidemia 02/19/2021   Anemia 10/12/2019   Cecal polyp 09/30/2019   History of colon polyps 09/30/2019   Abnormal  colonoscopy 09/30/2019   Arthritis of left knee 08/01/2019   Type 2 diabetes mellitus without complication, without long-term current use of insulin ([place]) 05/01/2019   Dyspnea on exertion 04/10/2019   Abnormal nuclear stress test 04/10/2019   Essential hypertension 02/27/2019   Acute pain of left knee 02/27/2019    Kathrine Cords, MSOT, OTR/L 03/25/2021, 11:06 AM  [facility] [place], [place], [address] Phone: [phone]   Fax:  [phone]  Name: Joe Stewart MRN: [id number] Date of Birth: November 21, 1955

## 2021-03-25 NOTE — Patient Instructions (Signed)
Lateral Pinch Strengthening (Resistive Putty)    Squeeze between thumb and side of each finger in turn. Repeat 15 times. Do 2 sessions per day.   Palmar Pinch Strengthening (Resistive Putty)    Pinch putty between thumb and index fingertip in turn after rolling out putty into a "hotdog" shape Repeat 15 times. Do 2 sessions per day.    IP Fist (Resistive Putty)  Keeping knuckles straight, bend fingertips to squeeze putty. Repeat 10 times. Do 1-2 sessions per day.   MP Flexion (Resistive Putty)  Bending only at large knuckles, press putty down against thumb. Keep fingertips straight. Repeat 10 times. Do 1-2 sessions per day.

## 2021-03-26 ENCOUNTER — Telehealth: Payer: Self-pay | Admitting: Registered Nurse

## 2021-03-26 ENCOUNTER — Encounter: Payer: Self-pay | Admitting: Podiatry

## 2021-03-26 ENCOUNTER — Ambulatory Visit: Payer: Medicare Other | Admitting: Podiatry

## 2021-03-26 ENCOUNTER — Ambulatory Visit: Payer: Medicare Other | Admitting: Cardiology

## 2021-03-26 DIAGNOSIS — E11621 Type 2 diabetes mellitus with foot ulcer: Secondary | ICD-10-CM

## 2021-03-26 DIAGNOSIS — E119 Type 2 diabetes mellitus without complications: Secondary | ICD-10-CM | POA: Diagnosis not present

## 2021-03-26 DIAGNOSIS — L97511 Non-pressure chronic ulcer of other part of right foot limited to breakdown of skin: Secondary | ICD-10-CM | POA: Diagnosis not present

## 2021-03-26 NOTE — Therapy (Signed)
[facility]. [place], [place], [address] Phone: [phone]   Fax:  [phone]  Speech Language Pathology Treatment  Patient Details  Name: Joe Stewart MRN: [id number] Date of Birth: 12/03/1955 Referring Provider (SLP): Bonnielee Haff, MD   Encounter Date: 03/25/2021   End of Session - 03/25/21 0935     Visit Number 6    Number of Visits 17    Date for SLP Re-Evaluation 06/01/21    SLP Start Time 0930    SLP Stop Time  1010    SLP Time Calculation (min) 40 min    Activity Tolerance Patient tolerated treatment well             Past Medical History:  Diagnosis Date   Arthritis    Cataract    Mixed OU   Diabetes mellitus without complication ([place])    Diabetic retinopathy ([place])    NPDR OU   Hyperlipidemia    Hypertension    Hypertensive retinopathy    OU   Obesity     Past Surgical History:  Procedure Laterality Date   BIOPSY  11/29/2019   Procedure: BIOPSY;  Surgeon: Irving Copas., MD;  Location: [facility];  Service: Gastroenterology;;   COLONOSCOPY WITH PROPOFOL N/A 11/29/2019   Procedure: COLONOSCOPY WITH PROPOFOL;  Surgeon: Irving Copas., MD;  Location: [facility];  Service: Gastroenterology;  Laterality: N/A;   ENDOSCOPIC MUCOSAL RESECTION N/A 11/29/2019   Procedure: ENDOSCOPIC MUCOSAL RESECTION;  Surgeon: Rush Landmark Telford Nab., MD;  [facility]: [facility];  Service: Gastroenterology;  Laterality: N/A;   ESOPHAGOGASTRODUODENOSCOPY (EGD) WITH PROPOFOL N/A 11/29/2019   Procedure: ESOPHAGOGASTRODUODENOSCOPY (EGD) WITH PROPOFOL;  Surgeon: Rush Landmark Telford Nab., MD;  Location: [facility];  Service: Gastroenterology;  Laterality: N/A;   EYE SURGERY Left    had to have eye flushed after getting costic sodium in L eye   HEMOSTASIS CLIP PLACEMENT  11/29/2019   Procedure: HEMOSTASIS CLIP PLACEMENT;  Surgeon: Irving Copas., MD;  Location: [facility];  Service:  Gastroenterology;;   HERNIA REPAIR     HERNIA REPAIR     35 years ago   HOT HEMOSTASIS N/A 11/29/2019   Procedure: HOT HEMOSTASIS (ARGON PLASMA COAGULATION/BICAP);  Surgeon: Irving Copas., MD;  Location: [facility];  Service: Gastroenterology;  Laterality: N/A;   LEFT HEART CATH AND CORONARY ANGIOGRAPHY N/A 04/11/2019   Procedure: LEFT HEART CATH AND CORONARY ANGIOGRAPHY;  Surgeon: Adrian Prows, MD;  Location: [facility];  Service: Cardiovascular;  Laterality: N/A;   POLYPECTOMY  11/29/2019   Procedure: POLYPECTOMY;  Surgeon: Rush Landmark Telford Nab., MD;  Location: [facility];  Service: Gastroenterology;;   RENAL ANGIOGRAPHY Bilateral 04/11/2019   Procedure: RENAL ANGIOGRAPHY;  Surgeon: Adrian Prows, MD;  Location: [facility];  Service: Cardiovascular;  Laterality: Bilateral;   SUBMUCOSAL LIFTING INJECTION  11/29/2019   Procedure: SUBMUCOSAL LIFTING INJECTION;  Surgeon: Irving Copas., MD;  Location: [facility];  Service: Gastroenterology;;   TOTAL KNEE ARTHROPLASTY Left 08/01/2019   Procedure: LEFT TOTAL KNEE ARTHROPLASTY;  Surgeon: Meredith Pel, MD;  Location: [facility];  Service: Orthopedics;  Laterality: Left;    There were no vitals filed for this visit.   Subjective Assessment - 03/25/21 0934     Subjective "I've got so many doctor appointments."    Currently in Pain? No/denies                   ADULT SLP TREATMENT - 03/26/21 0001  General Information   Behavior/Cognition Alert;Cooperative      Treatment Provided   Treatment provided Cognitive-Linquistic      Cognitive-Linquistic Treatment   Treatment focused on Cognition    Skilled Treatment Developed a routine to assist with managing time better at home (to complete exercises). SLP and pt began discussing external memory strategies to increase recall during conversations with others.. Pt agreed that he could benefit from using small notebook to write down information for  better recall. Discussed the importance of consistent use.      Assessment / Recommendations / Plan   Plan Continue with current plan of care      Progression Toward Goals   Progression toward goals Progressing toward goals                SLP Short Term Goals - 03/04/21 1248       SLP SHORT TERM GOAL #1   Title Pt will increase auditory comprehension by recalling and verbalizing strategies to assist with active listening during conversation with minA.    Time 4    Period Weeks    Status New    Target Date 04/01/21      SLP SHORT TERM GOAL #2   Title Pt will comprehend functional memory or visual aids for recall of important information during structured conversations with minA.    Time 4    Period Weeks    Status New    Target Date 04/01/21      SLP SHORT TERM GOAL #3   Title Pt will complete BNT naming test to identify anomia.    Time 3    Period Weeks    Status New    Target Date 03/25/21              SLP Long Term Goals - 03/04/21 1249       SLP LONG TERM GOAL #1   Title Pt will increase auditory comprehension by recalling and verbalizing strategies to assist with active listening during  structured conversation independently.    Time 8    Period Weeks    Status New    Target Date 04/29/21      SLP LONG TERM GOAL #2   Title Pt will comprehend functional memory or visual aids for recall of important information during structured conversation independently    Time 8    Period Weeks    Status New    Target Date 04/29/21              Plan - 03/26/21 1404     Clinical Impression Statement See tx note.  Cont with current POC.    Speech Therapy Frequency 2x / week    Duration 8 weeks    Treatment/Interventions Language facilitation;Environmental controls;Cueing hierarchy;SLP instruction and feedback;Cognitive reorganization;Functional tasks;Compensatory strategies;Internal/external aids;Multimodal communcation approach;Patient/family education     Potential to Achieve Goals Good    Consulted and Agree with Plan of Care Patient;Family member/caregiver    Family Member Consulted Brother             Patient will benefit from skilled therapeutic intervention in order to improve the following deficits and impairments:   Cognitive communication deficit    Problem List Patient Active Problem List   Diagnosis Date Noted   AKI (acute kidney injury) ([place]) 02/24/2021   Acute CVA (cerebrovascular accident) ([place]) 02/19/2021   Hyperlipidemia 02/19/2021   Anemia 10/12/2019   Cecal polyp 09/30/2019   History of colon polyps 09/30/2019   Abnormal  colonoscopy 09/30/2019   Arthritis of left knee 08/01/2019   Type 2 diabetes mellitus without complication, without long-term current use of insulin ([place]) 05/01/2019   Dyspnea on exertion 04/10/2019   Abnormal nuclear stress test 04/10/2019   Essential hypertension 02/27/2019   Acute pain of left knee 02/27/2019    Verdene Lennert, CCC-SLP 03/26/2021, 2:05 PM  [facility]. [place], [place], [address] Phone: [phone]   Fax:  [phone]   Name: Calhoun Reichardt MRN: [id number] Date of Birth: February 17, 1955

## 2021-03-26 NOTE — Progress Notes (Signed)
°  Subjective:  Patient ID: Joe Stewart, male    DOB: 20-Sep-1955,   MRN: [id number]  Chief Complaint  Patient presents with   Wound Check    2wk for wound check- patient states wound is getting better since the last visit it has turned into a scab    66 y.o. male presents for folow-up of right foot wound. Relates it seems to be getting better and has tried up and scabbed. No pain He is diabetic and last A1c was 12.3  . Denies any other pedal complaints. Denies n/v/f/c.   PCP: [name] MD   Past Medical History:  Diagnosis Date   Arthritis    Cataract    Mixed OU   Diabetes mellitus without complication ([place])    Diabetic retinopathy ([place])    NPDR OU   Hyperlipidemia    Hypertension    Hypertensive retinopathy    OU   Obesity     Objective:  Physical Exam: Vascular: DP/PT pulses 2/4 bilateral. CFT <3 seconds. Normal hair growth on digits. No edema.  Skin. No lacerations or abrasions bilateral feet. Right fifth lateral digit with wound noted measuring about 1.0 cm x 0.5 cm x 0.1 cm with granular base. Undelrying hyperkeratotic tissue No erythema edema or purulence noted.  Musculoskeletal: MMT 5/5 bilateral lower extremities in DF, PF, Inversion and Eversion. Deceased ROM in DF of ankle joint.  Neurological: Sensation intact to light touch.   Assessment:   1. Type 2 diabetes mellitus without complication, without long-term current use of insulin ([place])   2. Diabetic ulcer of toe of right foot associated with type 2 diabetes mellitus, limited to breakdown of skin ([place])       Plan:  Patient was evaluated and treated and all questions answered. Ulcer right fifth toe limited to breakdown of skin  -Debridement as below. -Dressed with betadine, DSD. -Off-loading with surgical shoe. -No abx indicated.  -Discussed glucose control and proper protein-rich diet.  -Discussed if any worsening redness, pain, fever or chills to call or may need to report to the emergency room.  Patient expressed understanding.   Procedure: Excisional Debridement of Wound Rationale: Removal of non-viable soft tissue from the wound to promote healing.  Anesthesia: none Pre-Debridement Wound Measurements: Overlying callus  Post-Debridement Wound Measurements: 1 cm x 0.5 cm x 0.2 cm  Type of Debridement: Sharp Excisional Tissue Removed: Non-viable soft tissue Depth of Debridement: subcutaneous tissue. Technique: Sharp excisional debridement to bleeding, viable wound base.  Dressing: Dry, sterile, compression dressing. Disposition: Patient tolerated procedure well. Patient to return in 2 week for follow-up.  Return in about 2 weeks (around 04/09/2021) for wound check.  Return in about 2 weeks (around 04/09/2021) for wound check.   Lorenda Peck, DPM

## 2021-03-26 NOTE — Telephone Encounter (Signed)
Pt's Joe Stewart, Brother called in stating that pt's blood sugar is dropping between breakfast and lunch. He has changed his diet and taking his medication. He has been having come dizzy spells. His brother said he always fell but he was able to catch him.   Please advise if pt needs an appt or if he needs to change up meds.

## 2021-03-26 NOTE — Progress Notes (Addendum)
[facility] Note  03/28/2021     CHIEF COMPLAINT Patient presents for Retina Follow Up   HISTORY OF PRESENT ILLNESS: Joe Stewart is a 67 y.o. male who presents to the clinic today for:  HPI     Retina Follow Up   Patient presents with  Diabetic Retinopathy.  In both eyes.  This started 11 months ago.  I, the attending physician,  performed the HPI with the patient and updated documentation appropriately.        Comments   Patient here for 11 months retina follow up for NPDR OU. Patient states had a stroke 02-19-21. Vision OD feel good. OS has a little bit of blurriness to it. In hospital vision was split at one time after stroke. Had no side peripheral vision was black. Came back.      Last edited by Bernarda Caffey, MD on 03/30/2021 11:32 PM.    Pt returns today bc he had a stroke on 01.18.23, pt states his left eye is a little cloudy now, right eye seems to be okay, pts sister states pts peripheral vision is "not there"  Referring physician: Shirleen Schirmer, [place],  [id number]   HISTORICAL INFORMATION:   Selected notes from the [facility] Referred by [name], PA LEE: 09.15.21 BCVA OD: 20/20 OS: 20/20 Ocular Hx- NPDR with edema  PMH- DM, HTN   CURRENT MEDICATIONS: No current outpatient medications on file. (Ophthalmic Drugs)   No current facility-administered medications for this visit. (Ophthalmic Drugs)   Current Outpatient Medications (Other)  Medication Sig   amLODipine (NORVASC) 10 MG tablet Take 1 tablet (10 mg total) by mouth daily.   aspirin EC 81 MG tablet Take 1 tablet (81 mg total) by mouth daily. Swallow whole.   atorvastatin (LIPITOR) 80 MG tablet Take 1 tablet (80 mg total) by mouth daily.   blood glucose meter kit and supplies KIT Dispense based on patient and insurance preference. Use up to four times daily as directed.   clonazePAM (KLONOPIN) 0.25 MG disintegrating tablet  Take 1 tablet (0.25 mg total) by mouth 2 (two) times daily.   empagliflozin (JARDIANCE) 25 MG TABS tablet Take 1 tablet (25 mg total) by mouth daily before breakfast.   hydrochlorothiazide (HYDRODIURIL) 25 MG tablet Take 1 tablet (25 mg total) by mouth every morning.   losartan (COZAAR) 50 MG tablet Take 1 tablet (50 mg total) by mouth daily at 10 pm.   metoprolol succinate (TOPROL-XL) 50 MG 24 hr tablet Take 1 tablet (50 mg total) by mouth daily. Take with or immediately following a meal.   No current facility-administered medications for this visit. (Other)   REVIEW OF SYSTEMS: ROS   Positive for: Gastrointestinal, Neurological, Musculoskeletal, Endocrine, Eyes Negative for: Constitutional, Skin, Genitourinary, HENT, Cardiovascular, Respiratory, Psychiatric, Allergic/Imm, Heme/Lymph Last edited by Theodore Demark, COA on 03/28/2021  1:22 PM.     ALLERGIES No Known Allergies  PAST MEDICAL HISTORY Past Medical History:  Diagnosis Date   Arthritis    Cataract    Mixed OU   Diabetes mellitus without complication ([place])    Diabetic retinopathy ([place])    NPDR OU   Hyperlipidemia    Hypertension    Hypertensive retinopathy    OU   Obesity    Past Surgical History:  Procedure Laterality Date   BIOPSY  11/29/2019   Procedure: BIOPSY;  Surgeon: Irving Copas., MD;  Location: [facility];  Service: Gastroenterology;;   COLONOSCOPY WITH PROPOFOL N/A 11/29/2019   Procedure: COLONOSCOPY WITH PROPOFOL;  Surgeon: Rush Landmark Telford Nab., MD;  Location: [facility];  Service: Gastroenterology;  Laterality: N/A;   ENDOSCOPIC MUCOSAL RESECTION N/A 11/29/2019   Procedure: ENDOSCOPIC MUCOSAL RESECTION;  Surgeon: Rush Landmark Telford Nab., MD;  Location: [facility];  Service: Gastroenterology;  Laterality: N/A;   ESOPHAGOGASTRODUODENOSCOPY (EGD) WITH PROPOFOL N/A 11/29/2019   Procedure: ESOPHAGOGASTRODUODENOSCOPY (EGD) WITH PROPOFOL;  Surgeon: Rush Landmark Telford Nab., MD;  Location:  [facility];  Service: Gastroenterology;  Laterality: N/A;   EYE SURGERY Left    had to have eye flushed after getting costic sodium in L eye   HEMOSTASIS CLIP PLACEMENT  11/29/2019   Procedure: HEMOSTASIS CLIP PLACEMENT;  Surgeon: Irving Copas., MD;  Location: [facility];  Service: Gastroenterology;;   HERNIA REPAIR     HERNIA REPAIR     35 years ago   HOT HEMOSTASIS N/A 11/29/2019   Procedure: HOT HEMOSTASIS (ARGON PLASMA COAGULATION/BICAP);  Surgeon: Irving Copas., MD;  Location: [facility];  Service: Gastroenterology;  Laterality: N/A;   LEFT HEART CATH AND CORONARY ANGIOGRAPHY N/A 04/11/2019   Procedure: LEFT HEART CATH AND CORONARY ANGIOGRAPHY;  Surgeon: Adrian Prows, MD;  Location: [facility];  Service: Cardiovascular;  Laterality: N/A;   POLYPECTOMY  11/29/2019   Procedure: POLYPECTOMY;  Surgeon: Rush Landmark Telford Nab., MD;  Location: [facility];  Service: Gastroenterology;;   RENAL ANGIOGRAPHY Bilateral 04/11/2019   Procedure: RENAL ANGIOGRAPHY;  Surgeon: Adrian Prows, MD;  Location: [facility];  Service: Cardiovascular;  Laterality: Bilateral;   SUBMUCOSAL LIFTING INJECTION  11/29/2019   Procedure: SUBMUCOSAL LIFTING INJECTION;  Surgeon: Irving Copas., MD;  Location: [facility];  Service: Gastroenterology;;   TOTAL KNEE ARTHROPLASTY Left 08/01/2019   Procedure: LEFT TOTAL KNEE ARTHROPLASTY;  Surgeon: Meredith Pel, MD;  Location: [facility];  Service: Orthopedics;  Laterality: Left;    FAMILY HISTORY Family History  Problem Relation Age of Onset   Diabetes Mother    COPD Mother    Cancer Mother        unknown type    Heart disease Father    COPD Sister    Healthy Brother    Heart disease Brother    Healthy Son    Colon cancer Neg Hx    Colon polyps Neg Hx    Esophageal cancer Neg Hx    Rectal cancer Neg Hx    Stomach cancer Neg Hx    Inflammatory bowel disease Neg Hx    Liver disease Neg Hx    Pancreatic cancer Neg Hx     SOCIAL HISTORY Social History   Tobacco Use   Smoking status: Never   Smokeless tobacco: Never  Vaping Use   Vaping Use: Never used  Substance Use Topics   Alcohol use: Not Currently   Drug use: Never       OPHTHALMIC EXAM:  Base Eye Exam     Visual Acuity (Snellen - Linear)       Right Left   Dist [place] 20/40 +2 20/40   Dist ph [place] 20/20 -2 NI         Tonometry (Tonopen, 1:17 PM)       Right Left   Pressure 11 06         Pupils       Dark Light Shape React APD   Right 3 2 Round Brisk None   Left 3 2 Round Brisk None  Visual Fields       Left Right   Restrictions Total superior nasal deficiency Total superior temporal deficiency         Extraocular Movement       Right Left    Full, Ortho Full, Ortho         Neuro/Psych     Oriented x3: Yes   Mood/Affect: Normal         Dilation     Both eyes: 1.0% Mydriacyl, 2.5% Phenylephrine @ 1:17 PM           Slit Lamp and Fundus Exam     Slit Lamp Exam       Right Left   Lids/Lashes Dermatochalasis - upper lid Dermatochalasis - upper lid   Conjunctiva/Sclera White and quiet White and quiet   Cornea Clear Mild tear film debris   Anterior Chamber Deep and quiet, deep, clear, narrow temporal angle Deep and quiet   Iris Round and dilated, No NVI Round and moderately dilated to 6.66m, No NVI, PPM nasal   Lens 2-3+ Nuclear sclerosis, 2-3+ Cortical cataract, +Vacuoles 2+ Nuclear sclerosis, 2-3+ Cortical cataract, +Vacuoles   Anterior Vitreous Vitreous syneresis Vitreous syneresis         Fundus Exam       Right Left   Disc Pink and Sharp, +Peripapillary atrophy, PPA/PPP Pink and Sharp, +Peripapillary atrophy, Compact   C/D Ratio 0.2 0.1   Macula Flat, Blunted foveal reflex, rare MA, RPE mottling and clumping, trace cystic changes Flat, Blunted foveal reflex, central cystic changes, rare MA   Vessels attenuated, mild tortuosity attenuated, Tortuous, mild Copper wiring, mild AV  crossing changes   Periphery Attached, pigmented cystoid degeneration ST periphery, rare scattered MA/IRH ST periphery, focal pigmented VR tuft at 0900 Attached, scattered MA / IRH greatest posteriorly, focal pigmented CR scarring along distal IT arcade (0130)           Refraction     Manifest Refraction       Sphere Cylinder Axis Dist VA   Right -1.25 +1.00 006 20/40   Left -1.25 +1.00 165 20/30-2  Post-dilation           IMAGING AND PROCEDURES  Imaging and Procedures for 03/28/2021  OCT, Retina - OU - Both Eyes       Right Eye Quality was good. Central Foveal Thickness: 328. Progression has improved. Findings include normal foveal contour, intraretinal fluid, no SRF, intraretinal hyper-reflective material (Scattered cystic changes, focal IRHM nasal macula, very shallow schisis ST periphery caught on widefield - stable from prior).   Left Eye Quality was good. Central Foveal Thickness: 317. Progression has improved. Findings include intraretinal fluid, no SRF, vitreomacular adhesion , normal foveal contour (interval improvement in IRF/IRHM, focal cystic changes superior macula).   Notes *Images captured and stored on drive  Diagnosis / Impression:  DME OU OD: Scattered cystic changes, focal IRHM nasal macula, very shallow schisis ST periphery caught on widefield - stable from prior OS: interval improvement in IRF/IRHM, focal cystic changes superior macula  Clinical management:  See below  Abbreviations: NFP - Normal foveal profile. CME - cystoid macular edema. PED - pigment epithelial detachment. IRF - intraretinal fluid. SRF - subretinal fluid. EZ - ellipsoid zone. ERM - epiretinal membrane. ORA - outer retinal atrophy. ORT - outer retinal tubulation. SRHM - subretinal hyper-reflective material. IRHM - intraretinal hyper-reflective material            ASSESSMENT/PLAN:    ICD-10-CM   1. Acute  CVA (cerebrovascular accident) ([place])  I63.9     2. Visual field  loss following cerebrovascular accident  I69.398    H54.7     3. Moderate nonproliferative diabetic retinopathy of both eyes with macular edema associated with type 2 diabetes mellitus (HCC)  E11.3313 OCT, Retina - OU - Both Eyes    4. Retinal edema  H35.81     5. Essential hypertension  I10     6. Hypertensive retinopathy of both eyes  H35.033     7. Combined forms of age-related cataract of both eyes  H25.813      **pt lost to f/u since 3.23.22 (11 mos)** -- presents back due to vision changes following CVA on 01.18.23  1,2. 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  - BCVA remains pretty good -- 20/20 OD, 20/40 OS  - will obtain baseline visual field next visit -- HVF 24-2  - f/u ~May 26, 2021  3,4. Moderate Non-proliferative diabetic retinopathy w/ DME OU  - pt lost to f/u from Mar 2022 to Feb 2023 (11 mos)  - S/P IVA OD #1 (10.26.21), #2 (11.24.21), #3 (12.22.21), #4 (03/06/20), #5 (03.23.22)  - S/P IVA OS #1 (03.23.22)             - A1c 12.3 on 01.19.23, 8.3% on 12.03.21; 7.4% on 06.04.21 - BCVA 20/20 OD, 20/40 OS  - exam shows scattered MA and mild edema/cystic changes improved from prior - FA (09.29.21) shows late leaking MA OU; no NV OU  - OCT shows OD: Scattered cystic changes, focal IRHM nasal macula, very shallow schisis ST periphery caught on widefield - stable from prior; OS: interval improvement in IRF/IRHM, focal cystic changes superior macula  - no retinal intervention recommended today as DME improved despite 11 mos delay in f/u  - pt had stoke 01.18.23 - will hold off on treatment until at least 3 months post-CVA - f/u week of May 26, 2021 -- DFE/OCT, HVF 24-2  5,6. Hypertensive retinopathy OU - discussed importance of tight BP  control - monitor  7. Mixed Cataract OU - The symptoms of cataract, surgical options, and treatments and risks were discussed with patient. - discussed diagnosis and progression - not yet visually significant - monitor for now   Ophthalmic Meds Ordered this visit:  No orders of the defined types were placed in this encounter.   Return for f/u week of April 24, NPDR OU, DFE, OCT, VF.  There are no Patient Instructions on file for this visit.  This document serves as a record of services personally performed by Gardiner Sleeper, MD, PhD. It was created on their behalf by San Jetty. Owens Shark, OA an ophthalmic technician. The creation of this record is the provider's dictation and/or activities during the visit.    Electronically signed by: San Jetty. Owens Shark, [place] 02.24.2023 12:04 AM  Gardiner Sleeper, M.D., Ph.D. Diseases & Surgery of the Retina and Vitreous [facility]  I have reviewed the above documentation for accuracy and completeness, and I agree with the above. Gardiner Sleeper, M.D., Ph.D. 03/31/21 4:10 PM   Abbreviations: M myopia (nearsighted); A astigmatism; H hyperopia (farsighted); P presbyopia; Mrx spectacle prescription;  CTL contact lenses; OD right eye; OS left eye; OU both eyes  XT  exotropia; ET esotropia; PEK punctate epithelial keratitis; PEE punctate epithelial erosions; DES dry eye syndrome; MGD meibomian gland dysfunction; ATs artificial tears; PFAT's preservative free artificial tears; [place] nuclear sclerotic cataract; PSC posterior subcapsular cataract; ERM epi-retinal membrane; PVD posterior vitreous detachment; RD retinal detachment; DM diabetes mellitus; DR diabetic retinopathy; NPDR non-proliferative diabetic retinopathy; PDR proliferative diabetic retinopathy; CSME clinically significant macular edema; DME diabetic macular edema; dbh dot blot hemorrhages; CWS cotton wool spot; POAG primary open angle glaucoma; C/D cup-to-disc ratio; HVF humphrey  visual field; GVF goldmann visual field; OCT optical coherence tomography; IOP intraocular pressure; BRVO Branch retinal vein occlusion; CRVO central retinal vein occlusion; CRAO central retinal artery occlusion; BRAO branch retinal artery occlusion; RT retinal tear; SB scleral buckle; PPV pars plana vitrectomy; VH Vitreous hemorrhage; PRP panretinal laser photocoagulation; IVK intravitreal kenalog; VMT vitreomacular traction; MH Macular hole;  NVD neovascularization of the disc; NVE neovascularization elsewhere; AREDS age related eye disease study; ARMD age related macular degeneration; POAG primary open angle glaucoma; EBMD epithelial/anterior basement membrane dystrophy; ACIOL anterior chamber intraocular lens; IOL intraocular lens; PCIOL posterior chamber intraocular lens; Phaco/IOL phacoemulsification with intraocular lens placement; [place] photorefractive keratectomy; LASIK laser assisted in situ keratomileusis; HTN hypertension; DM diabetes mellitus; COPD chronic obstructive pulmonary disease

## 2021-03-27 ENCOUNTER — Ambulatory Visit: Payer: Medicare Other | Admitting: Physical Therapy

## 2021-03-27 ENCOUNTER — Ambulatory Visit: Payer: Medicare Other | Admitting: Speech Pathology

## 2021-03-27 ENCOUNTER — Encounter: Payer: Self-pay | Admitting: Registered Nurse

## 2021-03-27 ENCOUNTER — Ambulatory Visit: Payer: Medicare Other | Admitting: Occupational Therapy

## 2021-03-27 NOTE — Telephone Encounter (Signed)
Called and spoke with the patient and made him a appointment for tomorrow

## 2021-03-27 NOTE — Telephone Encounter (Signed)
°  Pt's [name], Brother called in stating that pt's blood sugar is dropping between breakfast and lunch. He has changed his diet and taking his medication. He has been having come dizzy spells. His brother said he always fell but he was able to catch him.    Please advise if pt needs an appt or if he needs to change up meds.

## 2021-03-27 NOTE — Telephone Encounter (Signed)
Pt brother called pt is restless and unable to sleep getting up and down. Pt has bin up since 2 this morning. Pt med of metoprolol succinate (TOPROL-XL) 50 MG 24 hr tablet  increased and empagliflozin (JARDIANCE) 25 MG TABS tablet  was added. Please advise if pt needs med appt or what he needs to do.

## 2021-03-27 NOTE — Telephone Encounter (Signed)
Any home sugar readings or BP readings available?  Thanks,  Denice Paradise

## 2021-03-28 ENCOUNTER — Telehealth (INDEPENDENT_AMBULATORY_CARE_PROVIDER_SITE_OTHER): Payer: Medicare Other | Admitting: Registered Nurse

## 2021-03-28 ENCOUNTER — Ambulatory Visit (INDEPENDENT_AMBULATORY_CARE_PROVIDER_SITE_OTHER): Payer: Medicare Other | Admitting: Ophthalmology

## 2021-03-28 ENCOUNTER — Encounter (INDEPENDENT_AMBULATORY_CARE_PROVIDER_SITE_OTHER): Payer: Self-pay | Admitting: Ophthalmology

## 2021-03-28 ENCOUNTER — Other Ambulatory Visit: Payer: Self-pay

## 2021-03-28 ENCOUNTER — Encounter: Payer: Self-pay | Admitting: Registered Nurse

## 2021-03-28 VITALS — BP 137/77

## 2021-03-28 DIAGNOSIS — I639 Cerebral infarction, unspecified: Secondary | ICD-10-CM

## 2021-03-28 DIAGNOSIS — H25813 Combined forms of age-related cataract, bilateral: Secondary | ICD-10-CM | POA: Diagnosis not present

## 2021-03-28 DIAGNOSIS — F410 Panic disorder [episodic paroxysmal anxiety] without agoraphobia: Secondary | ICD-10-CM | POA: Diagnosis not present

## 2021-03-28 DIAGNOSIS — E113313 Type 2 diabetes mellitus with moderate nonproliferative diabetic retinopathy with macular edema, bilateral: Secondary | ICD-10-CM | POA: Diagnosis not present

## 2021-03-28 DIAGNOSIS — H35033 Hypertensive retinopathy, bilateral: Secondary | ICD-10-CM

## 2021-03-28 DIAGNOSIS — I10 Essential (primary) hypertension: Secondary | ICD-10-CM

## 2021-03-28 DIAGNOSIS — H3581 Retinal edema: Secondary | ICD-10-CM

## 2021-03-28 DIAGNOSIS — H547 Unspecified visual loss: Secondary | ICD-10-CM | POA: Diagnosis not present

## 2021-03-28 MED ORDER — CLONAZEPAM 0.25 MG PO TBDP: 0.2500 mg | ORAL_TABLET | Freq: Two times a day (BID) | ORAL | 0 refills | Status: DC

## 2021-03-28 NOTE — Telephone Encounter (Signed)
Discussed this at visit with patient today.

## 2021-03-28 NOTE — Patient Instructions (Addendum)
Joe Stewart -   Doristine Devoid to speak with you and Pam today.   Stop glimepiride. Continue jardiance.  Take clonazepam sublingually twice daily as needed. Can help with anxiety and sleep  Continue to monitor sugars.   Thank you  Rich     If you have lab work done today you will be contacted with your lab results within the next 2 weeks.  If you have not heard from [place] then please contact us. The fastest way to get your results is to register for My Chart.   IF you received an x-ray today, you will receive an invoice from [facility]. Please contact [facility] at [phone] with questions or concerns regarding your invoice.   IF you received labwork today, you will receive an invoice from [facility]. Please contact [facility] at [phone] with questions or concerns regarding your invoice.   Our billing staff will not be able to assist you with questions regarding bills from these companies.  You will be contacted with the lab results as soon as they are available. The fastest way to get your results is to activate your My Chart account. Instructions are located on the last page of this paperwork. If you have not heard from [place] regarding the results in 2 weeks, please contact this office.

## 2021-03-28 NOTE — Progress Notes (Signed)
Telemedicine Encounter- SOAP NOTE Established Patient  This telephone encounter was conducted with the patient's (or proxy's) verbal consent via audio telecommunications: yes/no: Yes Patient was instructed to have this encounter in a suitably private space; and to only have persons present to whom they give permission to participate. In addition, patient identity was confirmed by use of name plus two identifiers (DOB and address).  I discussed the limitations, risks, security and privacy concerns of performing an evaluation and management service by telephone and the availability of in person appointments. I also discussed with the patient that there may be a patient responsible charge related to this service. The patient expressed understanding and agreed to proceed.  I spent a total of 14 minutes talking with the patient or their proxy.  Patient at home Provider in office  Participants: Joe Ruddy, NP and Joe Stewart  Chief Complaint  Patient presents with   Dizziness    Patient states he has been having problems with is blood sugars dropping , feeling dizzy and feels like he is having a panic attacks for a few days.     Subjective   Joe Stewart is a 66 y.o. established patient. Telephone visit today for dizziness  HPI Onset past few days -  Low sugars  Low blood pressures  This leads to dizziness and nausea.   He has had some anxieties and near panic attacks  Some insomnia - often wakes in the night to use the restroom, can't fall back asleep due to insomnia, anxiety. Did sleep better last night.   Did have episode of dizziness this morning but resolved.  Notes blood pressures stay 120s/70s. Blood sugars have dropped as low as 82 Lab Results  Component Value Date   HGBA1C 12.3 (H) 02/20/2021    Patient Active Problem List   Diagnosis Date Noted   AKI (acute kidney injury) ([place]) 02/24/2021   Acute CVA (cerebrovascular accident) ([place]) 02/19/2021   Hyperlipidemia  02/19/2021   Anemia 10/12/2019   Cecal polyp 09/30/2019   History of colon polyps 09/30/2019   Abnormal colonoscopy 09/30/2019   Arthritis of left knee 08/01/2019   Type 2 diabetes mellitus without complication, without long-term current use of insulin ([place]) 05/01/2019   Dyspnea on exertion 04/10/2019   Abnormal nuclear stress test 04/10/2019   Essential hypertension 02/27/2019   Acute pain of left knee 02/27/2019    Past Medical History:  Diagnosis Date   Arthritis    Cataract    Mixed OU   Diabetes mellitus without complication (HCC)    Diabetic retinopathy ([place])    NPDR OU   Hyperlipidemia    Hypertension    Hypertensive retinopathy    OU   Obesity     Current Outpatient Medications  Medication Sig Dispense Refill   amLODipine (NORVASC) 10 MG tablet Take 1 tablet (10 mg total) by mouth daily. 30 tablet 2   aspirin EC 81 MG tablet Take 1 tablet (81 mg total) by mouth daily. Swallow whole. 30 tablet 11   atorvastatin (LIPITOR) 80 MG tablet Take 1 tablet (80 mg total) by mouth daily. 30 tablet 2   blood glucose meter kit and supplies KIT Dispense based on patient and insurance preference. Use up to four times daily as directed. 1 each 0   clonazePAM (KLONOPIN) 0.25 MG disintegrating tablet Take 1 tablet (0.25 mg total) by mouth 2 (two) times daily. 60 tablet 0   empagliflozin (JARDIANCE) 25 MG TABS tablet Take 1 tablet (25 mg  total) by mouth daily before breakfast. 90 tablet 0   glimepiride (AMARYL) 2 MG tablet Take 1 tablet (2 mg total) by mouth daily with breakfast. (Patient not taking: Reported on 03/28/2021) 30 tablet 1   hydrochlorothiazide (HYDRODIURIL) 25 MG tablet Take 1 tablet (25 mg total) by mouth every morning. 90 tablet 0   losartan (COZAAR) 50 MG tablet Take 1 tablet (50 mg total) by mouth daily at 10 pm. 90 tablet 0   metoprolol succinate (TOPROL-XL) 50 MG 24 hr tablet Take 1 tablet (50 mg total) by mouth daily. Take with or immediately following a meal. 90 tablet  3   No current facility-administered medications for this visit.    No Known Allergies  Social History   Socioeconomic History   Marital status: Single    Spouse name: Not on file   Number of children: 1   Years of education: Not on file   Highest education level: Not on file  Occupational History   Not on file  Tobacco Use   Smoking status: Never   Smokeless tobacco: Never  Vaping Use   Vaping Use: Never used  Substance and Sexual Activity   Alcohol use: Not Currently   Drug use: Never   Sexual activity: Not Currently  Other Topics Concern   Not on file  Social History Narrative   Not on file   Social Determinants of Health   Financial Resource Strain: Low Risk    Difficulty of Paying Living Expenses: Not hard at all  Food Insecurity: No Food Insecurity   Worried About Charity fundraiser in the Last Year: Never true   [place] in the Last Year: Never true  Transportation Needs: No Transportation Needs   Lack of Transportation (Medical): No   Lack of Transportation (Non-Medical): No  Physical Activity: Not on file  Stress: Not on file  Social Connections: Not on file  Intimate Partner Violence: Not on file    Review of Systems  Constitutional: Negative.   HENT: Negative.    Eyes: Negative.   Respiratory: Negative.    Cardiovascular: Negative.   Gastrointestinal: Negative.   Genitourinary: Negative.   Musculoskeletal: Negative.   Skin: Negative.   Neurological:  Positive for dizziness. Negative for tingling, tremors, sensory change, speech change, focal weakness, seizures, loss of consciousness, weakness and headaches.  Endo/Heme/Allergies: Negative.   Psychiatric/Behavioral:  The patient is nervous/anxious and has insomnia.   All other systems reviewed and are negative.  Objective   Vitals as reported by the patient: Today's Vitals   03/28/21 1159  BP: 137/77    [name] was seen today for dizziness.  Diagnoses and all orders for this  visit:  Panic attacks -     clonazePAM (KLONOPIN) 0.25 MG disintegrating tablet; Take 1 tablet (0.25 mg total) by mouth 2 (two) times daily.    PLAN Dizziness likely low sugars given very elevated A1c. Will stop glimepiride.  Add clonazepam 0.14m sublingual po bid prn for anxiety and sleep Check in at 1 week Patient encouraged to call clinic with any questions, comments, or concerns.  I discussed the assessment and treatment plan with the patient. The patient was provided an opportunity to ask questions and all were answered. The patient agreed with the plan and demonstrated an understanding of the instructions.   The patient was advised to call back or seek an in-person evaluation if the symptoms worsen or if the condition fails to improve as anticipated.  I provided 14  minutes of non-face-to-face time during this encounter.  [name], NP

## 2021-03-28 NOTE — Telephone Encounter (Signed)
Patient uploaded his BP readings before visit to discuss.

## 2021-03-30 ENCOUNTER — Encounter (INDEPENDENT_AMBULATORY_CARE_PROVIDER_SITE_OTHER): Payer: Self-pay | Admitting: Ophthalmology

## 2021-04-01 ENCOUNTER — Ambulatory Visit: Payer: Medicare Other | Admitting: Speech Pathology

## 2021-04-01 ENCOUNTER — Ambulatory Visit: Payer: Medicare Other | Admitting: Physical Therapy

## 2021-04-01 ENCOUNTER — Encounter: Payer: Self-pay | Admitting: Speech Pathology

## 2021-04-01 ENCOUNTER — Ambulatory Visit: Payer: Medicare Other | Admitting: Occupational Therapy

## 2021-04-01 ENCOUNTER — Encounter: Payer: Self-pay | Admitting: Physical Therapy

## 2021-04-01 ENCOUNTER — Other Ambulatory Visit: Payer: Self-pay

## 2021-04-01 DIAGNOSIS — R29818 Other symptoms and signs involving the nervous system: Secondary | ICD-10-CM | POA: Diagnosis not present

## 2021-04-01 DIAGNOSIS — R2681 Unsteadiness on feet: Secondary | ICD-10-CM | POA: Diagnosis not present

## 2021-04-01 DIAGNOSIS — R278 Other lack of coordination: Secondary | ICD-10-CM | POA: Diagnosis not present

## 2021-04-01 DIAGNOSIS — R41840 Attention and concentration deficit: Secondary | ICD-10-CM

## 2021-04-01 DIAGNOSIS — M6281 Muscle weakness (generalized): Secondary | ICD-10-CM

## 2021-04-01 DIAGNOSIS — I69351 Hemiplegia and hemiparesis following cerebral infarction affecting right dominant side: Secondary | ICD-10-CM | POA: Diagnosis not present

## 2021-04-01 DIAGNOSIS — R2689 Other abnormalities of gait and mobility: Secondary | ICD-10-CM | POA: Diagnosis not present

## 2021-04-01 DIAGNOSIS — R262 Difficulty in walking, not elsewhere classified: Secondary | ICD-10-CM

## 2021-04-01 DIAGNOSIS — M25662 Stiffness of left knee, not elsewhere classified: Secondary | ICD-10-CM | POA: Diagnosis not present

## 2021-04-01 DIAGNOSIS — R41841 Cognitive communication deficit: Secondary | ICD-10-CM

## 2021-04-01 DIAGNOSIS — R41842 Visuospatial deficit: Secondary | ICD-10-CM

## 2021-04-01 NOTE — Therapy (Signed)
[facility]. [place], [place], [address] Phone: [phone]   Fax:  [phone]  Speech Language Pathology Treatment  Patient Details  Name: Joe Stewart MRN: [id number] Date of Birth: 02-10-1955 Referring Provider (SLP): Bonnielee Haff, MD   Encounter Date: 04/01/2021   End of Session - 04/01/21 1535     Visit Number 7    Number of Visits 17    Date for SLP Re-Evaluation 06/01/21    SLP Start Time 0931    SLP Stop Time  38    SLP Time Calculation (min) 44 min    Activity Tolerance Patient tolerated treatment well             Past Medical History:  Diagnosis Date   Arthritis    Cataract    Mixed OU   Diabetes mellitus without complication ([place])    Diabetic retinopathy ([place])    NPDR OU   Hyperlipidemia    Hypertension    Hypertensive retinopathy    OU   Obesity     Past Surgical History:  Procedure Laterality Date   BIOPSY  11/29/2019   Procedure: BIOPSY;  Surgeon: Irving Copas., MD;  Location: [facility];  Service: Gastroenterology;;   COLONOSCOPY WITH PROPOFOL N/A 11/29/2019   Procedure: COLONOSCOPY WITH PROPOFOL;  Surgeon: Irving Copas., MD;  Location: [facility];  Service: Gastroenterology;  Laterality: N/A;   ENDOSCOPIC MUCOSAL RESECTION N/A 11/29/2019   Procedure: ENDOSCOPIC MUCOSAL RESECTION;  Surgeon: Rush Landmark Telford Nab., MD;  Location: [facility];  Service: Gastroenterology;  Laterality: N/A;   ESOPHAGOGASTRODUODENOSCOPY (EGD) WITH PROPOFOL N/A 11/29/2019   Procedure: ESOPHAGOGASTRODUODENOSCOPY (EGD) WITH PROPOFOL;  Surgeon: Rush Landmark Telford Nab., MD;  Location: [facility];  Service: Gastroenterology;  Laterality: N/A;   EYE SURGERY Left    had to have eye flushed after getting costic sodium in L eye   HEMOSTASIS CLIP PLACEMENT  11/29/2019   Procedure: HEMOSTASIS CLIP PLACEMENT;  Surgeon: Irving Copas., MD;  Location: [facility];  Service:  Gastroenterology;;   HERNIA REPAIR     HERNIA REPAIR     35 years ago   HOT HEMOSTASIS N/A 11/29/2019   Procedure: HOT HEMOSTASIS (ARGON PLASMA COAGULATION/BICAP);  Surgeon: Irving Copas., MD;  Location: [facility];  Service: Gastroenterology;  Laterality: N/A;   LEFT HEART CATH AND CORONARY ANGIOGRAPHY N/A 04/11/2019   Procedure: LEFT HEART CATH AND CORONARY ANGIOGRAPHY;  Surgeon: Adrian Prows, MD;  Location: [facility];  Service: Cardiovascular;  Laterality: N/A;   POLYPECTOMY  11/29/2019   Procedure: POLYPECTOMY;  Surgeon: Rush Landmark Telford Nab., MD;  Location: [facility];  Service: Gastroenterology;;   RENAL ANGIOGRAPHY Bilateral 04/11/2019   Procedure: RENAL ANGIOGRAPHY;  Surgeon: Adrian Prows, MD;  Location: [facility];  Service: Cardiovascular;  Laterality: Bilateral;   SUBMUCOSAL LIFTING INJECTION  11/29/2019   Procedure: SUBMUCOSAL LIFTING INJECTION;  Surgeon: Irving Copas., MD;  Location: [facility];  Service: Gastroenterology;;   TOTAL KNEE ARTHROPLASTY Left 08/01/2019   Procedure: LEFT TOTAL KNEE ARTHROPLASTY;  Surgeon: Meredith Pel, MD;  Location: [facility];  Service: Orthopedics;  Laterality: Left;    There were no vitals filed for this visit.   Subjective Assessment - 04/01/21 1534     Subjective "I've been having these panic attack like things."    Currently in Pain? No/denies                   ADULT SLP TREATMENT - 04/01/21  [facility]   Behavior/Cognition Alert;Cooperative      Treatment Provided   Treatment provided Cognitive-Linquistic      Cognitive-Linquistic Treatment   Treatment focused on Patient/family/caregiver education    Skilled Treatment Provided edu on relaxation strategies after brain injury from [facility]. Provided edu on how meditation and mindfulness impact our thinking skills. Practiced 4:7:8 and belly breathing. Discussed healthy ways to keep his mind active at home. Pt  participated in the conversation and demonstrated understanding by asking appropriate questions and providing examples.      Assessment / Recommendations / Plan   Plan Continue with current plan of care      Progression Toward Goals   Progression toward goals Progressing toward goals                SLP Short Term Goals - 04/01/21 1536       SLP SHORT TERM GOAL #1   Title Pt will increase auditory comprehension by recalling and verbalizing strategies to assist with active listening during conversation with minA.    Time 4    Period Weeks    Status Achieved    Target Date 04/01/21      SLP SHORT TERM GOAL #2   Title Pt will comprehend functional memory or visual aids for recall of important information during structured conversations with minA.    Time 4    Period Weeks    Status On-going   Not met   Target Date 04/01/21      SLP SHORT TERM GOAL #3   Title Pt will complete BNT naming test to identify anomia.    Time 3    Period Weeks    Status On-going   Not met   Target Date 03/25/21              SLP Long Term Goals - 03/04/21 1249       SLP LONG TERM GOAL #1   Title Pt will increase auditory comprehension by recalling and verbalizing strategies to assist with active listening during  structured conversation independently.    Time 8    Period Weeks    Status New    Target Date 04/29/21      SLP LONG TERM GOAL #2   Title Pt will comprehend functional memory or visual aids for recall of important information during structured conversation independently    Time 8    Period Weeks    Status New    Target Date 04/29/21              Plan - 04/01/21 1536     Clinical Impression Statement See tx note.  Cont with current POC.    Speech Therapy Frequency 2x / week    Duration 8 weeks    Treatment/Interventions Language facilitation;Environmental controls;Cueing hierarchy;SLP instruction and feedback;Cognitive reorganization;Functional tasks;Compensatory  strategies;Internal/external aids;Multimodal communcation approach;Patient/family education    Potential to Achieve Goals Good    Consulted and Agree with Plan of Care Patient;Family member/caregiver    Family Member Consulted Brother             Patient will benefit from skilled therapeutic intervention in order to improve the following deficits and impairments:   Cognitive communication deficit    Problem List Patient Active Problem List   Diagnosis Date Noted   AKI (acute kidney injury) ([place]) 02/24/2021   Acute CVA (cerebrovascular accident) ([place]) 02/19/2021   Hyperlipidemia 02/19/2021   Anemia 10/12/2019  Cecal polyp 09/30/2019   History of colon polyps 09/30/2019   Abnormal colonoscopy 09/30/2019   Arthritis of left knee 08/01/2019   Type 2 diabetes mellitus without complication, without long-term current use of insulin ([place]) 05/01/2019   Dyspnea on exertion 04/10/2019   Abnormal nuclear stress test 04/10/2019   Essential hypertension 02/27/2019   Acute pain of left knee 02/27/2019    Verdene Lennert, CCC-SLP 04/01/2021, 3:39 PM  [facility]. [place], [place], [address] Phone: [phone]   Fax:  [phone]   Name: Joe Stewart MRN: [id number] Date of Birth: 07-23-1955

## 2021-04-01 NOTE — Therapy (Signed)
[facility]. [place], [place], [address] Phone: [phone]   Fax:  [phone]  Physical Therapy Treatment  Patient Details  Name: Joe Stewart MRN: [id number] Date of Birth: Jun 18, 1955 No data recorded  Encounter Date: 04/01/2021   PT End of Session - 04/01/21 1143     Visit Number 7    Date for PT Re-Evaluation 06/01/21    PT Start Time 1101    PT Stop Time 1141    PT Time Calculation (min) 40 min    Equipment Utilized During Treatment Gait belt    Activity Tolerance Patient tolerated treatment well    Behavior During Therapy WFL for tasks assessed/performed             Past Medical History:  Diagnosis Date   Arthritis    Cataract    Mixed OU   Diabetes mellitus without complication ([place])    Diabetic retinopathy ([place])    NPDR OU   Hyperlipidemia    Hypertension    Hypertensive retinopathy    OU   Obesity     Past Surgical History:  Procedure Laterality Date   BIOPSY  11/29/2019   Procedure: BIOPSY;  Surgeon: Irving Copas., MD;  Location: [facility];  Service: Gastroenterology;;   COLONOSCOPY WITH PROPOFOL N/A 11/29/2019   Procedure: COLONOSCOPY WITH PROPOFOL;  Surgeon: Irving Copas., MD;  Location: [facility];  Service: Gastroenterology;  Laterality: N/A;   ENDOSCOPIC MUCOSAL RESECTION N/A 11/29/2019   Procedure: ENDOSCOPIC MUCOSAL RESECTION;  Surgeon: Rush Landmark Telford Nab., MD;  Location: [facility];  Service: Gastroenterology;  Laterality: N/A;   ESOPHAGOGASTRODUODENOSCOPY (EGD) WITH PROPOFOL N/A 11/29/2019   Procedure: ESOPHAGOGASTRODUODENOSCOPY (EGD) WITH PROPOFOL;  Surgeon: Rush Landmark Telford Nab., MD;  [facility]: [facility];  Service: Gastroenterology;  Laterality: N/A;   EYE SURGERY Left    had to have eye flushed after getting costic sodium in L eye   HEMOSTASIS CLIP PLACEMENT  11/29/2019   Procedure: HEMOSTASIS CLIP PLACEMENT;  Surgeon: Irving Copas., MD;  [facility]: [facility];  Service: Gastroenterology;;   HERNIA REPAIR     HERNIA REPAIR     35 years ago   HOT HEMOSTASIS N/A 11/29/2019   Procedure: HOT HEMOSTASIS (ARGON PLASMA COAGULATION/BICAP);  Surgeon: Irving Copas., MD;  Location: [facility];  Service: Gastroenterology;  Laterality: N/A;   LEFT HEART CATH AND CORONARY ANGIOGRAPHY N/A 04/11/2019   Procedure: LEFT HEART CATH AND CORONARY ANGIOGRAPHY;  Surgeon: Adrian Prows, MD;  Location: [facility];  Service: Cardiovascular;  Laterality: N/A;   POLYPECTOMY  11/29/2019   Procedure: POLYPECTOMY;  Surgeon: Rush Landmark Telford Nab., MD;  Location: [facility];  Service: Gastroenterology;;   RENAL ANGIOGRAPHY Bilateral 04/11/2019   Procedure: RENAL ANGIOGRAPHY;  Surgeon: Adrian Prows, MD;  Location: [facility];  Service: Cardiovascular;  Laterality: Bilateral;   SUBMUCOSAL LIFTING INJECTION  11/29/2019   Procedure: SUBMUCOSAL LIFTING INJECTION;  Surgeon: Irving Copas., MD;  Location: [facility];  Service: Gastroenterology;;   TOTAL KNEE ARTHROPLASTY Left 08/01/2019   Procedure: LEFT TOTAL KNEE ARTHROPLASTY;  Surgeon: Meredith Pel, MD;  Location: [facility];  Service: Orthopedics;  Laterality: Left;    There were no vitals filed for this visit.   Subjective Assessment - 04/01/21 1101     Subjective Patient reports that he had some medication changes which have allowed him to rest better at night.    Currently in Pain? No/denies  [facility] Adult PT Treatment/Exercise - 04/01/21 0001       Ambulation/Gait   Gait Comments Patient continues to demosntrate small hesitation at toe off of gait, on R.      Knee/Hip Exercises: Aerobic   Nustep L5 x 5 minutes, followed by 3 x 30 seconds at L4 very fast.      Knee/Hip Exercises: Plyometrics   Other Plyometric Exercises Jumping-Started at sink, then on mini tramp emphasizing toe off, 2 x 15 seconds iwth  BUE support.      Knee/Hip Exercises: Standing   Forward Step Up Both;1 set;10 reps;Hand Hold: 0;Step Height: 2"   Step up onto Air Ex pad, drive opposite knee up. required occasional HHA for balance.   Other Standing Knee Exercises Heel raises on step, 1 x 10 with B, 1 x 10 with each limb individually.                     PT Education - 04/01/21 1142     Education Details Continue to emphasize SLS on R as well as speed of movement activities.    Person(s) Educated Patient    Methods Explanation    Comprehension Verbalized understanding              PT Short Term Goals - 04/01/21 1145       PT SHORT TERM GOAL #1   Title independent with initial HEP    Baseline Patient encouraged to perofrm HEP.    Time 1    Period Weeks    Status Achieved               PT Long Term Goals - 03/25/21 1148       PT LONG TERM GOAL #1   Title Pt will be I and compliant with HEP.    Time 12    Period Weeks    Status New      PT LONG TERM GOAL #2   Title Pt will improve right hip/knee strength to 5/5 MMT tested in sitting to improve overall funciton    Time 12    Period Weeks    Status New      PT LONG TERM GOAL #3   Title improve berg balance score to 47/56    Time 12    Period Weeks    Status New      PT LONG TERM GOAL #4   Title increase DGI score to 19    Baseline 18    Time 9    Period Weeks    Status On-going                   Plan - 04/01/21 1113     Clinical Impression Statement Patient is performing HEP, emphasizing speed of movement and plyometrics. Treatment focused on stability in each LE, speed of movment, plyometrics to incresae control of RLE during functional activities.    Stability/Clinical Decision Making Evolving/Moderate complexity    Clinical Decision Making Low    Rehab Potential Good    PT Frequency 2x / week    PT Duration Other (comment)   9w   PT Treatment/Interventions ADLs/Self Care Home Management;Gait  training;Neuromuscular re-education;Balance training;Therapeutic exercise;Therapeutic activities;Functional mobility training;Stair training;Patient/family education;Manual techniques    PT Next Visit Plan Motor control and coordination, speed of movement, R ankle strength.    PT Home Exercise Plan [id number]    Consulted and Agree with Plan of Care Patient  Patient will benefit from skilled therapeutic intervention in order to improve the following deficits and impairments:  Abnormal gait, Decreased coordination, Decreased range of motion, Difficulty walking, Dizziness, Decreased activity tolerance, Decreased balance, Decreased strength, Decreased mobility  Visit Diagnosis: Unsteadiness on feet  Difficulty in walking, not elsewhere classified  Hemiplegia and hemiparesis following cerebral infarction affecting right dominant side (HCC)  Other abnormalities of gait and mobility  Other lack of coordination  Muscle weakness (generalized)     Problem List Patient Active Problem List   Diagnosis Date Noted   AKI (acute kidney injury) ([place]) 02/24/2021   Acute CVA (cerebrovascular accident) ([place]) 02/19/2021   Hyperlipidemia 02/19/2021   Anemia 10/12/2019   Cecal polyp 09/30/2019   History of colon polyps 09/30/2019   Abnormal colonoscopy 09/30/2019   Arthritis of left knee 08/01/2019   Type 2 diabetes mellitus without complication, without long-term current use of insulin ([place]) 05/01/2019   Dyspnea on exertion 04/10/2019   Abnormal nuclear stress test 04/10/2019   Essential hypertension 02/27/2019   Acute pain of left knee 02/27/2019    Marcelina Morel, DPT 04/01/2021, 11:46 AM  [place]. [place], [place], [address] Phone: [phone]   Fax:  [phone]  Name: Joe Stewart MRN: [id number] Date of Birth: [birth year]

## 2021-04-01 NOTE — Therapy (Signed)
[facility] [place], [place], [address] Phone: [phone]   Fax:  [phone]  Occupational Therapy Treatment  Patient Details  Name: Joe Stewart MRN: [id number] Date of Birth: [birth year] Referring Provider (OT): Joe Haff, MD   Encounter Date: 04/01/2021   OT End of Session - 04/01/21 1021     Visit Number 7    Number of Visits 17    Date for OT Re-Evaluation 05/27/21    Authorization Type [employer]    OT Start Time 1018    OT Stop Time 1058    OT Time Calculation (min) 40 min    Activity Tolerance Patient tolerated treatment well    Behavior During Therapy WFL for tasks assessed/performed            Past Medical History:  Diagnosis Date   Arthritis    Cataract    Mixed OU   Diabetes mellitus without complication ([place])    Diabetic retinopathy ([place])    NPDR OU   Hyperlipidemia    Hypertension    Hypertensive retinopathy    OU   Obesity     Past Surgical History:  Procedure Laterality Date   BIOPSY  11/29/2019   Procedure: BIOPSY;  Surgeon: Joe Stewart., MD;  Location: [facility];  Service: Gastroenterology;;   COLONOSCOPY WITH PROPOFOL N/A 11/29/2019   Procedure: COLONOSCOPY WITH PROPOFOL;  Surgeon: Joe Stewart., MD;  Location: [facility];  Service: Gastroenterology;  Laterality: N/A;   ENDOSCOPIC MUCOSAL RESECTION N/A 11/29/2019   Procedure: ENDOSCOPIC MUCOSAL RESECTION;  Surgeon: Joe Stewart., MD;  Location: [facility];  Service: Gastroenterology;  Laterality: N/A;   ESOPHAGOGASTRODUODENOSCOPY (EGD) WITH PROPOFOL N/A 11/29/2019   Procedure: ESOPHAGOGASTRODUODENOSCOPY (EGD) WITH PROPOFOL;  Surgeon: Joe Stewart., MD;  Location: [facility];  Service: Gastroenterology;  Laterality: N/A;   EYE SURGERY Left    had to have eye flushed after getting costic sodium in L eye   HEMOSTASIS CLIP PLACEMENT  11/29/2019   Procedure:  HEMOSTASIS CLIP PLACEMENT;  Surgeon: Joe Stewart., MD;  Location: [facility];  Service: Gastroenterology;;   HERNIA REPAIR     HERNIA REPAIR     35 years ago   HOT HEMOSTASIS N/A 11/29/2019   Procedure: HOT HEMOSTASIS (ARGON PLASMA COAGULATION/BICAP);  Surgeon: Joe Stewart., MD;  Location: [facility];  Service: Gastroenterology;  Laterality: N/A;   LEFT HEART CATH AND CORONARY ANGIOGRAPHY N/A 04/11/2019   Procedure: LEFT HEART CATH AND CORONARY ANGIOGRAPHY;  Surgeon: Joe Prows, MD;  Location: [facility];  Service: Cardiovascular;  Laterality: N/A;   POLYPECTOMY  11/29/2019   Procedure: POLYPECTOMY;  Surgeon: Joe Stewart., MD;  Location: [facility];  Service: Gastroenterology;;   RENAL ANGIOGRAPHY Bilateral 04/11/2019   Procedure: RENAL ANGIOGRAPHY;  Surgeon: Joe Prows, MD;  Location: [facility];  Service: Cardiovascular;  Laterality: Bilateral;   SUBMUCOSAL LIFTING INJECTION  11/29/2019   Procedure: SUBMUCOSAL LIFTING INJECTION;  Surgeon: Joe Stewart., MD;  Location: [facility];  Service: Gastroenterology;;   TOTAL KNEE ARTHROPLASTY Left 08/01/2019   Procedure: LEFT TOTAL KNEE ARTHROPLASTY;  Surgeon: Joe Pel, MD;  Location: [facility];  Service: Orthopedics;  Laterality: Left;    There were no vitals filed for this visit.   Subjective Assessment - 04/01/21 1019     Subjective  Pt states he has been experiencing increased feelings of anxiety recently; reports his PCP adjusted some medications and that "right now,  I'm feeling better." Pt also reports that followed up w/ his eye doctor and was told that the blurred vision in his L eye is "a little bit of a cataract" that will continue to be monitored.    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 No/denies             OT Treatment - 04/01/21    Therapeutic Activities Construction of paper structures following multiple, 1 to 2-component instructions. Completed first structure w/ Mod A, requiring demonstration, breakdown of task, extended time for processing, and multimodal cues (visual, gestural, verbal) for success. Second structure completed w/ Mod A, but decreased facilitation likely due to decreased novelty of task.    Medication Management Completed simulated med mgmt activity w/ 3 medications of varied instruction (e.g., 1 tablet 2x/day, 1 tablet every AM, etc.) and AM/PM pillbox; able to complete activity w/ Mod I and 1 error. OT discussed recommendation that pt participate in this at home w/ SPV to ensure medication is organized correctly or chaining to practice w/ 1 of his medications prior to completing entire activity; pt verbalized understanding.            OT Short Term Goals - 03/14/21 1004       OT SHORT TERM GOAL #1   Title Pt will verbalize understanding of visual compensatory strategies to incorporate during tabletop/near visual acuity activities    Baseline No visual compensatory strategies    Time 2    Period Weeks    Status Achieved   03/11/21   Target Date 03/18/21      OT SHORT TERM GOAL #2   Title Pt will be able to complete symbol cancellation task, using AE/compensatory strategies prn w/ 100% accuracy    Baseline Completed 50% of page w/ 14/15 correct    Time 4    Period Weeks    Status Achieved   03/14/21 - 24/24 using line guide   Target Date 04/01/21             OT Long Term Goals - 03/18/21 1125       OT LONG TERM GOAL #1   Title Pt will demonstrate  improved participation in functional FM tasks as evidenced by decreasing 9-HPT time by at least 5 seconds w/ R, dominant hand    Baseline 44 sec (L hand 36 sec)    Time 8    Period Weeks    Status Achieved   03/18/21 - 35 sec   Target Date 04/29/21      OT LONG TERM GOAL #2   Title Pt will be able to sort medications (simulated or actual) correctly, using AE/compensatory strategies prn    Baseline Not currently managing medication    Time 8    Period Weeks    Status On-going    Target Date 04/29/21      OT LONG TERM GOAL #3   Title Pt will be able to complete simulated meal prep activity w/ Mod I    Baseline Not currently participating in meal  prep    Time 8    Period Weeks    Status On-going    Target Date 04/29/21      OT LONG TERM GOAL #4   Title Pt will demonstrate independence w/ compensatory strategies, including AE prn, to improve handwriting legibility and efficiency    Baseline Fair legibility; tremor observed    Time 8    Period Weeks    Status On-going    Target Date 04/29/21      OT LONG TERM GOAL #5   Title Pt will be able to complete TMT: Part B in less than 1 min, 30 sec to indicate potential appropriateness for initiating return to driving    Baseline Over 1 min, 30 sec    Time 8    Period Weeks    Status On-going    Target Date 04/29/21             Plan - 04/01/21 1023     Clinical Impression Statement Pt appears to be doing this well this session and reports working w/ his MD to adjust his medications and receive an rx for panic attacks. OT discussed other options for managing anxiety including going on walks, finding a quiet space, and/or finding a new hobby he enjoys (e.g., puzzles, reading, etc.); pt verbalized understanding. OT then incorporated constructional activity used to facilitate visual perception, alternating attention, sequencing, [place], problem-solving, and coordination. Med management activity completed after to continue to build on learned  strategies using a more functional example. Pt experienced most difficulty w/ sequencing and problem-solving, but was more successful w/ med mgmt, likely partially due to constructional activity being a novel task.    OT Occupational Profile and History Detailed Assessment- Review of Records and additional review of physical, cognitive, psychosocial history related to current functional performance    Occupational performance deficits (Please refer to evaluation for details): ADL's;IADL's;Leisure;Social Participation    Body Structure / Function / Physical Skills ADL;Decreased knowledge of use of DME;Strength;Balance;Dexterity;GMC;UE functional use;IADL;Vision;Coordination;FMC;Decreased knowledge of precautions    Cognitive Skills Memory;Safety Awareness    Psychosocial Skills Environmental  Adaptations    Rehab Potential Good    Clinical Decision Making Limited treatment options, no task modification necessary    Comorbidities Affecting Occupational Performance: May have comorbidities impacting occupational performance    Modification or Assistance to Complete Evaluation  No modification of tasks or assist necessary to complete eval    OT Frequency 2x / week    OT Duration 8 weeks    OT Treatment/Interventions Self-care/ADL training;DME and/or AE instruction;Balance training;Therapeutic activities;Therapeutic exercise;Visual/perceptual remediation/compensation;Neuromuscular education;Energy conservation;Manual Therapy;Patient/family education;Cognitive remediation/compensation    Plan Meal prep activity; review putty exercises prn    Recommended Other Services Currently receiving PT/ST services at this location    Consulted and Agree with Plan of Care Patient            Patient will benefit from skilled therapeutic intervention in order to improve the following deficits and impairments:   Body Structure / Function / Physical Skills: ADL, Decreased knowledge of use of DME, Strength, Balance,  Dexterity, GMC, UE functional use, IADL, Vision, Coordination, FMC, Decreased knowledge of precautions Cognitive Skills: Memory, Safety Awareness Psychosocial Skills: Environmental  Adaptations   Visit Diagnosis: Hemiplegia and hemiparesis following cerebral infarction affecting right dominant side (H[facility])  Attention and concentration deficit  Visuospatial deficit  Other symptoms and signs involving the nervous system  Other lack of coordination   Problem List Patient Active Problem List   Diagnosis  Date Noted   AKI (acute kidney injury) ([place]) 02/24/2021   Acute CVA (cerebrovascular accident) ([place]) 02/19/2021   Hyperlipidemia 02/19/2021   Anemia 10/12/2019   Cecal polyp 09/30/2019   History of colon polyps 09/30/2019   Abnormal colonoscopy 09/30/2019   Arthritis of left knee 08/01/2019   Type 2 diabetes mellitus without complication, without long-term current use of insulin ([place]) 05/01/2019   Dyspnea on exertion 04/10/2019   Abnormal nuclear stress test 04/10/2019   Essential hypertension 02/27/2019   Acute pain of left knee 02/27/2019    Kathrine Cords, MSOT, OTR/L 04/01/2021, 11:24 AM  [place]. [place], [place], [address] Phone: [phone]   Fax:  [phone]  Name: Joe Stewart MRN: [id number] Date of Birth: 1955/08/21

## 2021-04-03 ENCOUNTER — Other Ambulatory Visit: Payer: Self-pay

## 2021-04-03 ENCOUNTER — Ambulatory Visit: Payer: Medicare Other | Attending: Internal Medicine | Admitting: Physical Therapy

## 2021-04-03 ENCOUNTER — Ambulatory Visit: Payer: Medicare Other | Admitting: Speech Pathology

## 2021-04-03 ENCOUNTER — Ambulatory Visit: Payer: Medicare Other | Admitting: Occupational Therapy

## 2021-04-03 ENCOUNTER — Encounter: Payer: Self-pay | Admitting: Occupational Therapy

## 2021-04-03 ENCOUNTER — Encounter: Payer: Self-pay | Admitting: Speech Pathology

## 2021-04-03 ENCOUNTER — Encounter: Payer: Self-pay | Admitting: Physical Therapy

## 2021-04-03 DIAGNOSIS — R41841 Cognitive communication deficit: Secondary | ICD-10-CM

## 2021-04-03 DIAGNOSIS — I69351 Hemiplegia and hemiparesis following cerebral infarction affecting right dominant side: Secondary | ICD-10-CM | POA: Diagnosis not present

## 2021-04-03 DIAGNOSIS — R278 Other lack of coordination: Secondary | ICD-10-CM | POA: Insufficient documentation

## 2021-04-03 DIAGNOSIS — R41842 Visuospatial deficit: Secondary | ICD-10-CM | POA: Insufficient documentation

## 2021-04-03 DIAGNOSIS — M6281 Muscle weakness (generalized): Secondary | ICD-10-CM | POA: Diagnosis not present

## 2021-04-03 DIAGNOSIS — R29818 Other symptoms and signs involving the nervous system: Secondary | ICD-10-CM | POA: Insufficient documentation

## 2021-04-03 DIAGNOSIS — R262 Difficulty in walking, not elsewhere classified: Secondary | ICD-10-CM | POA: Diagnosis not present

## 2021-04-03 DIAGNOSIS — M25662 Stiffness of left knee, not elsewhere classified: Secondary | ICD-10-CM | POA: Diagnosis not present

## 2021-04-03 DIAGNOSIS — R41840 Attention and concentration deficit: Secondary | ICD-10-CM | POA: Insufficient documentation

## 2021-04-03 DIAGNOSIS — R2681 Unsteadiness on feet: Secondary | ICD-10-CM | POA: Insufficient documentation

## 2021-04-03 DIAGNOSIS — R2689 Other abnormalities of gait and mobility: Secondary | ICD-10-CM | POA: Insufficient documentation

## 2021-04-03 NOTE — Therapy (Signed)
[facility] ?[place] ?[address] ?[place], [place], [address] ?Phone: [phone]   Fax:  [phone] ? ?Physical Therapy Treatment ? ?Patient Details  ?Name: Joe Stewart ?MRN: [id number] ?Date of Birth: 1955-06-14 ?No data recorded ? ?Encounter Date: 04/03/2021 ? ? PT End of Session - 04/03/21 1137   ? ? Visit Number 8   ? Date for PT Re-Evaluation 06/01/21   ? PT Start Time 1100   ? PT Stop Time 0623   ? PT Time Calculation (min) 38 min   ? Equipment Utilized During Treatment Gait belt   ? Activity Tolerance Patient tolerated treatment well   ? Behavior During Therapy [facility] for tasks assessed/performed   ? ?  ?  ? ?  ? ? ?Past Medical History:  ?Diagnosis Date  ? Arthritis   ? Cataract   ? Mixed OU  ? Diabetes mellitus without complication ([place])   ? Diabetic retinopathy ([place])   ? NPDR OU  ? Hyperlipidemia   ? Hypertension   ? Hypertensive retinopathy   ? OU  ? Obesity   ? ? ?Past Surgical History:  ?Procedure Laterality Date  ? BIOPSY  11/29/2019  ? Procedure: BIOPSY;  Surgeon: Irving Copas., MD;  Location: [facility];  Service: Gastroenterology;;  ? COLONOSCOPY WITH PROPOFOL N/A 11/29/2019  ? Procedure: COLONOSCOPY WITH PROPOFOL;  Surgeon: Mansouraty, Telford Nab., MD;  Location: [facility];  Service: Gastroenterology;  Laterality: N/A;  ? ENDOSCOPIC MUCOSAL RESECTION N/A 11/29/2019  ? Procedure: ENDOSCOPIC MUCOSAL RESECTION;  Surgeon: Rush Landmark Telford Nab., MD;  Location: [facility];  Service: Gastroenterology;  Laterality: N/A;  ? ESOPHAGOGASTRODUODENOSCOPY (EGD) WITH PROPOFOL N/A 11/29/2019  ? Procedure: ESOPHAGOGASTRODUODENOSCOPY (EGD) WITH PROPOFOL;  Surgeon: Rush Landmark Telford Nab., MD;  Location: [facility];  Service: Gastroenterology;  Laterality: N/A;  ? EYE SURGERY Left   ? had to have eye flushed after getting costic sodium in L eye  ? HEMOSTASIS CLIP PLACEMENT  11/29/2019  ? Procedure: HEMOSTASIS CLIP PLACEMENT;  Surgeon: Irving Copas., MD;  Location: [facility];  Service: Gastroenterology;;  ? HERNIA REPAIR    ? HERNIA REPAIR    ? 35 years ago  ? HOT HEMOSTASIS N/A 11/29/2019  ? Procedure: HOT HEMOSTASIS (ARGON PLASMA COAGULATION/BICAP);  Surgeon: Irving Copas., MD;  Location: [facility];  Service: Gastroenterology;  Laterality: N/A;  ? LEFT HEART CATH AND CORONARY ANGIOGRAPHY N/A 04/11/2019  ? Procedure: LEFT HEART CATH AND CORONARY ANGIOGRAPHY;  Surgeon: Adrian Prows, MD;  Location: [facility];  Service: Cardiovascular;  Laterality: N/A;  ? POLYPECTOMY  11/29/2019  ? Procedure: POLYPECTOMY;  Surgeon: Irving Copas., MD;  Location: [facility];  Service: Gastroenterology;;  ? RENAL ANGIOGRAPHY Bilateral 04/11/2019  ? Procedure: RENAL ANGIOGRAPHY;  Surgeon: Adrian Prows, MD;  Location: [facility];  Service: Cardiovascular;  Laterality: Bilateral;  ? SUBMUCOSAL LIFTING INJECTION  11/29/2019  ? Procedure: SUBMUCOSAL LIFTING INJECTION;  Surgeon: Irving Copas., MD;  Location: [facility];  Service: Gastroenterology;;  ? TOTAL KNEE ARTHROPLASTY Left 08/01/2019  ? Procedure: LEFT TOTAL KNEE ARTHROPLASTY;  Surgeon: Meredith Pel, MD;  Location: [facility];  Service: Orthopedics;  Laterality: Left;  ? ? ?There were no vitals filed for this visit. ? ? Subjective Assessment - 04/03/21 1108   ? ? Subjective Patient reports that he overdid it the other day. After treatment he walked a long way and then did more exercises. He feels more stiff lately.   ? Patient is  accompained by: Family member   ? Patient Stated Goals walk better, be more independent, have better balance, drive   ? Currently in Pain? No/denies   ? ?  ?  ? ?  ? ? ? ? ? ? ? ? ? ? ? ? ? ? ? ? ? ? ? ? [facility] Adult PT Treatment/Exercise - 04/03/21 0001   ? ?  ? Knee/Hip Exercises: Stretches  ? Active Hamstring Stretch Both;3 reps;5 reps   ? Passive Hamstring Stretch Both;3 reps;10 seconds   ? Passive Hamstring Stretch Limitations Also stretch  into abd  and add x 3 reps.   ? Other Knee/Hip Stretches Single an ddouble KTC 3 x 10 second holds each.   ?  ? Knee/Hip Exercises: Aerobic  ? Nustep L 5 x 6 minutes.   ?  ? Knee/Hip Exercises: Plyometrics  ? Other Plyometric Exercises Quick steps with small hop forward and back and side to side. 10 reps each, with UUE support on rail for safety.   ? ?  ?  ? ?  ? ? ? ? ? ? ? ? ? ? PT Education - 04/03/21 1128   ? ? Education Details Updated HEP for lower body stretch.   ? Person(s) Educated Patient   ? Methods Explanation;Demonstration;Handout   ? Comprehension Returned demonstration;Verbalized understanding   ? ?  ?  ? ?  ? ? ? PT Short Term Goals - 04/01/21 1145   ? ?  ? PT SHORT TERM GOAL #1  ? Title independent with initial HEP   ? Baseline Patient encouraged to perofrm HEP.   ? Time 1   ? Period Weeks   ? Status Achieved   ? ?  ?  ? ?  ? ? ? ? PT Long Term Goals - 04/03/21 1140   ? ?  ? PT LONG TERM GOAL #1  ? Title Pt will be I and compliant with HEP.   ? Time 10   ? Period Weeks   ? Status On-going   ?  ? PT LONG TERM GOAL #2  ? Title Pt will improve right hip/knee strength to 5/5 MMT tested in sitting to improve overall funciton   ? Baseline 4/5, decresed coordinated control functionally.   ? Time 10   ? Period Weeks   ? Status New   ?  ? PT LONG TERM GOAL #3  ? Title improve berg balance score to 47/56   ? Time 12   ? Period Weeks   ? Status New   ?  ? PT LONG TERM GOAL #4  ? Title increase DGI score to 19   ? Baseline 18   ? Time 9   ? Period Weeks   ? Status On-going   ? ?  ?  ? ?  ? ? ? ? ? ? ? ? Plan - 04/03/21 1127   ? ? Clinical Impression Statement Patient arrived feeling fatrigued and stiff. After warm up he continued to feel stiff. Educated him to stretching program for lower body, with written instructions for HEP and he performed each exercise. He then performed quick, plyo movmeents side to side and forward and back, with UE support for balance.   ? Stability/Clinical Decision Making Evolving/Moderate  complexity   ? Clinical Decision Making Low   ? Rehab Potential Good   ? PT Frequency 2x / week   ? PT Duration Other (comment)   9w  ? PT Treatment/Interventions ADLs/Self Care Home  Management;Gait training;Neuromuscular re-education;Balance training;Therapeutic exercise;Therapeutic activities;Functional mobility training;Stair training;Patient/family education;Manual techniques   ? PT Next Visit Plan Motor control and coordination, speed of movement, R ankle strength.   ? PT Home Exercise Plan [id number],  [id number]   ? Consulted and Agree with Plan of Care Patient   ? ?  ?  ? ?  ? ? ?Patient will benefit from skilled therapeutic intervention in order to improve the following deficits and impairments:  Abnormal gait, Decreased coordination, Decreased range of motion, Difficulty walking, Dizziness, Decreased activity tolerance, Decreased balance, Decreased strength, Decreased mobility ? ?Visit Diagnosis: ?Hemiplegia and hemiparesis following cerebral infarction affecting right dominant side ([place]) ? ?Other lack of coordination ? ?Unsteadiness on feet ? ?Difficulty in walking, not elsewhere classified ? ?Other abnormalities of gait and mobility ? ?Muscle weakness (generalized) ? ?Stiffness of left knee, not elsewhere classified ? ? ? ? ?Problem List ?Patient Active Problem List  ? Diagnosis Date Noted  ? AKI (acute kidney injury) ([place]) 02/24/2021  ? Acute CVA (cerebrovascular accident) ([place]) 02/19/2021  ? Hyperlipidemia 02/19/2021  ? Anemia 10/12/2019  ? Cecal polyp 09/30/2019  ? History of colon polyps 09/30/2019  ? Abnormal colonoscopy 09/30/2019  ? Arthritis of left knee 08/01/2019  ? Type 2 diabetes mellitus without complication, without long-term current use of insulin ([place]) 05/01/2019  ? Dyspnea on exertion 04/10/2019  ? Abnormal nuclear stress test 04/10/2019  ? Essential hypertension 02/27/2019  ? Acute pain of left knee 02/27/2019  ? ? ?Marcelina Morel, DPT ?04/03/2021, 11:42 AM ? ?[facility] ?[address] ?[place], [place], [address] ?Phone: [phone]   Fax:  [phone] ? ?Name: Kobee Medlen ?MRN: [id number] ?Date of Birth: 04/19/1955 ? ? ? ?

## 2021-04-03 NOTE — Therapy (Signed)
[facility]. [place], [place], [address] Phone: [phone]   Fax:  [phone]  Occupational Therapy Treatment  Patient Details  Name: Joe Stewart MRN: [id number] Date of Birth: 04-Jul-1955 Referring Provider (OT): Bonnielee Haff, MD   Encounter Date: 04/03/2021   OT End of Session - 04/03/21 1019     Visit Number 8    Number of Visits 17    Date for OT Re-Evaluation 05/27/21    Authorization Type [employer]    OT Start Time 1015    OT Stop Time 1055    OT Time Calculation (min) 40 min    Activity Tolerance Patient tolerated treatment well    Behavior During Therapy WFL for tasks assessed/performed            Past Medical History:  Diagnosis Date   Arthritis    Cataract    Mixed OU   Diabetes mellitus without complication ([facility])    Diabetic retinopathy ([place])    NPDR OU   Hyperlipidemia    Hypertension    Hypertensive retinopathy    OU   Obesity     Past Surgical History:  Procedure Laterality Date   BIOPSY  11/29/2019   Procedure: BIOPSY;  Surgeon: Irving Copas., MD;  Location: [facility];  Service: Gastroenterology;;   COLONOSCOPY WITH PROPOFOL N/A 11/29/2019   Procedure: COLONOSCOPY WITH PROPOFOL;  Surgeon: Irving Copas., MD;  Location: [facility];  Service: Gastroenterology;  Laterality: N/A;   ENDOSCOPIC MUCOSAL RESECTION N/A 11/29/2019   Procedure: ENDOSCOPIC MUCOSAL RESECTION;  Surgeon: Rush Landmark Telford Nab., MD;  Location: [facility];  Service: Gastroenterology;  Laterality: N/A;   ESOPHAGOGASTRODUODENOSCOPY (EGD) WITH PROPOFOL N/A 11/29/2019   Procedure: ESOPHAGOGASTRODUODENOSCOPY (EGD) WITH PROPOFOL;  Surgeon: Rush Landmark Telford Nab., MD;  Location: [facility];  Service: Gastroenterology;  Laterality: N/A;   EYE SURGERY Left    had to have eye flushed after getting costic sodium in L eye   HEMOSTASIS CLIP PLACEMENT  11/29/2019   Procedure:  HEMOSTASIS CLIP PLACEMENT;  Surgeon: Irving Copas., MD;  Location: [facility];  Service: Gastroenterology;;   HERNIA REPAIR     HERNIA REPAIR     35 years ago   HOT HEMOSTASIS N/A 11/29/2019   Procedure: HOT HEMOSTASIS (ARGON PLASMA COAGULATION/BICAP);  Surgeon: Irving Copas., MD;  Location: [facility];  Service: Gastroenterology;  Laterality: N/A;   LEFT HEART CATH AND CORONARY ANGIOGRAPHY N/A 04/11/2019   Procedure: LEFT HEART CATH AND CORONARY ANGIOGRAPHY;  Surgeon: Adrian Prows, MD;  Location: [facility];  Service: Cardiovascular;  Laterality: N/A;   POLYPECTOMY  11/29/2019   Procedure: POLYPECTOMY;  Surgeon: Rush Landmark Telford Nab., MD;  Location: [facility];  Service: Gastroenterology;;   RENAL ANGIOGRAPHY Bilateral 04/11/2019   Procedure: RENAL ANGIOGRAPHY;  Surgeon: Adrian Prows, MD;  Location: [facility];  Service: Cardiovascular;  Laterality: Bilateral;   SUBMUCOSAL LIFTING INJECTION  11/29/2019   Procedure: SUBMUCOSAL LIFTING INJECTION;  Surgeon: Irving Copas., MD;  Location: [facility];  Service: Gastroenterology;;   TOTAL KNEE ARTHROPLASTY Left 08/01/2019   Procedure: LEFT TOTAL KNEE ARTHROPLASTY;  Surgeon: Meredith Pel, MD;  Location: [facility];  Service: Orthopedics;  Laterality: Left;    There were no vitals filed for this visit.   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 No/denies             OT Treatment - 04/03/21    IADLs Simulated meal prep/shopping activity, involving using a provided recipe to plan/prepare and organize grocery list. Pt experienced difficulty w/ initial understanding of task, writing list for what ingredients he would use vs. ingredients included in recipe, but was able to correct after cues and breakdown of task. After copying first part of recipe, pt then began copying directions despite cues to create grocery list and required cues to correct/redirect to task. Activity also addressed handwriting w/ fair legibility and decreased tremor/ataxia observed compared to prior sessions.    Handwriting & Coordination Intermediate tabletop word search and easy pictorial crossword puzzle completed to facilitate [facility] and coordination, letter formation during handwriting, alternating attention, and visual perception. OT attempted weighted pen w/ built-up handle; no significant difference in legibility observed. No difficulty observed w/ word search or word finding during either task.            OT Short Term Goals - 03/14/21 1004       OT SHORT TERM GOAL #1   Title Pt will verbalize understanding of visual compensatory strategies to incorporate during tabletop/near visual acuity activities    Baseline No visual compensatory strategies    Time 2    Period Weeks    Status Achieved   03/11/21   Target Date 03/18/21      OT SHORT TERM GOAL #2   Title Pt will be able to complete symbol cancellation task, using AE/compensatory strategies prn w/ 100% accuracy    Baseline Completed 50% of page w/ 14/15 correct    Time 4    Period Weeks    Status Achieved   03/14/21 - 24/24 using line guide   Target Date 04/01/21             OT Long Term Goals - 04/03/21 1027       OT LONG TERM GOAL #1   Title Pt will demonstrate improved participation in  functional FM tasks as evidenced by decreasing 9-HPT time by at least 5 seconds w/ R, dominant hand    Baseline 44 sec (L hand 36 sec)    Time 8    Period Weeks    Status Achieved   03/18/21 - 35 sec   Target Date 04/29/21      OT LONG TERM GOAL #2   Title Pt will be able to sort medications (simulated or actual) correctly, using AE/compensatory strategies prn    Baseline Not currently managing medication    Time 8    Period Weeks    Status On-going    Target Date 04/29/21      OT LONG TERM GOAL #3   Title Pt will be able to complete simulated meal prep activity w/ Mod I    Baseline Not currently participating in meal prep    Time 8    Period Weeks  Status Achieved   04/03/21 - completing independently at home, per pt report   Target Date 04/29/21      OT LONG TERM GOAL #4   Title Pt will demonstrate independence w/ compensatory strategies, including AE prn, to improve handwriting legibility and efficiency    Baseline Fair legibility; tremor observed    Time 8    Period Weeks    Status On-going    Target Date 04/29/21      OT LONG TERM GOAL #5   Title Pt will be able to complete TMT: Part B in less than 1 min, 30 sec to indicate potential appropriateness for initiating return to driving    Baseline Over 1 min, 30 sec    Time 8    Period Weeks    Status On-going    Target Date 04/29/21             Plan - 04/03/21 1020     Clinical Impression Statement OT facilitated discussion regarding participation in functional activities at home to identify concerns pt may want to address in therapy. Pt reports he has been working on managing his taxes and demonstrates improved memory and awareness compared to prior session. OT also again encouraged pt to participation in his own med management at home, making sure he has someone complete w/ him or check behind him after completion for safety. OT then facilitated meal prep/shopping activity w/ pt exhibiting difficulty w/ orientation to  task, generalization, organization of thought, and memory. Due to continued difficulty w/ handwriting, OT also incorporated word search/crossword puzzle activities, encouraging pt to practice activities at home. Difficult to determine if decreased coordination during handwriting is 2/2 CVA or present prior to CVA.    OT Occupational Profile and History Detailed Assessment- Review of Records and additional review of physical, cognitive, psychosocial history related to current functional performance    Occupational performance deficits (Please refer to evaluation for details): ADL's;IADL's;Leisure;Social Participation    Body Structure / Function / Physical Skills ADL;Decreased knowledge of use of DME;Strength;Balance;Dexterity;GMC;UE functional use;IADL;Vision;Coordination;FMC;Decreased knowledge of precautions    Cognitive Skills Memory;Safety Awareness    Psychosocial Skills Environmental  Adaptations    Rehab Potential Good    Clinical Decision Making Limited treatment options, no task modification necessary    Comorbidities Affecting Occupational Performance: May have comorbidities impacting occupational performance    Modification or Assistance to Complete Evaluation  No modification of tasks or assist necessary to complete eval    OT Frequency 2x / week    OT Duration 8 weeks    OT Treatment/Interventions Self-care/ADL training;DME and/or AE instruction;Balance training;Therapeutic activities;Therapeutic exercise;Visual/perceptual remediation/compensation;Neuromuscular education;Energy conservation;Manual Therapy;Patient/family education;Cognitive remediation/compensation    Plan IADL activity (meal prep/shopping, planning for day, etc.); continue to address handwriting and [place]; review putty exercises prn    Recommended Other Services Currently receiving PT/ST services at this location    Consulted and Agree with Plan of Care Patient            Patient will benefit from skilled therapeutic  intervention in order to improve the following deficits and impairments:   Body Structure / Function / Physical Skills: ADL, Decreased knowledge of use of DME, Strength, Balance, Dexterity, GMC, UE functional use, IADL, Vision, Coordination, FMC, Decreased knowledge of precautions Cognitive Skills: Memory, Safety Awareness Psychosocial Skills: Environmental  Adaptations   Visit Diagnosis: Attention and concentration deficit  Other lack of coordination  Visuospatial deficit  Other symptoms and signs involving the nervous system   Problem List  Patient Active Problem List   Diagnosis Date Noted   AKI (acute kidney injury) ([place]) 02/24/2021   Acute CVA (cerebrovascular accident) ([place]) 02/19/2021   Hyperlipidemia 02/19/2021   Anemia 10/12/2019   Cecal polyp 09/30/2019   History of colon polyps 09/30/2019   Abnormal colonoscopy 09/30/2019   Arthritis of left knee 08/01/2019   Type 2 diabetes mellitus without complication, without long-term current use of insulin ([place]) 05/01/2019   Dyspnea on exertion 04/10/2019   Abnormal nuclear stress test 04/10/2019   Essential hypertension 02/27/2019   Acute pain of left knee 02/27/2019    Kathrine Cords, MSOT, OTR/L  04/03/2021, 11:22 AM  [facility]. [place], [place], [address] Phone: [phone]   Fax:  [phone]  Name: Joe Stewart MRN: [id number] Date of Birth: Mar 29, 1955

## 2021-04-03 NOTE — Patient Instructions (Signed)
Access Code: [email or web address] ?URL: [email or web address] ?Date: 04/03/2021 ?Prepared by: Ethel Rana ? ?Exercises ?Single Knee to Chest Stretch - 1 x daily - 7 x weekly - 3 reps - 10 hold ?Supine Double Knee to Chest - 1 x daily - 7 x weekly - 3 reps - 10 hold ?Supine Hamstring Stretch - 1 x daily - 7 x weekly - 3 reps - 10 hold ?Supine Hamstring Stretch with Strap - 1 x daily - 7 x weekly - 3 reps - 10 hold ?Hip Adductors and Hamstring Stretch with Strap - 1 x daily - 7 x weekly - 3 reps - 10 hold ?Supine ITB Stretch with Strap - 1 x daily - 7 x weekly - 3 reps - 10 hold ? ?

## 2021-04-03 NOTE — Therapy (Signed)
[facility] ?[place] [place] [address] ?[place], [place], [address] ?Phone: [phone]   Fax:  [phone] ? ?Speech Language Pathology Treatment ? ?Patient Details  ?Name: Joe Stewart ?MRN: [id number] ?Date of Birth: 08/20/1955 ?Referring Provider (SLP): Bonnielee Haff, MD ? ? ?Encounter Date: 04/03/2021 ? ? End of Session - 04/03/21 0939   ? ? Visit Number 8   ? Number of Visits 17   ? Date for SLP Re-Evaluation 06/01/21   ? SLP Start Time 0932   ? SLP Stop Time  1010   ? SLP Time Calculation (min) 38 min   ? Activity Tolerance Patient tolerated treatment well   ? ?  ?  ? ?  ? ? ?Past Medical History:  ?Diagnosis Date  ? Arthritis   ? Cataract   ? Mixed OU  ? Diabetes mellitus without complication ([facility])   ? Diabetic retinopathy ([place])   ? NPDR OU  ? Hyperlipidemia   ? Hypertension   ? Hypertensive retinopathy   ? OU  ? Obesity   ? ? ?Past Surgical History:  ?Procedure Laterality Date  ? BIOPSY  11/29/2019  ? Procedure: BIOPSY;  Surgeon: Irving Copas., MD;  Location: [facility];  Service: Gastroenterology;;  ? COLONOSCOPY WITH PROPOFOL N/A 11/29/2019  ? Procedure: COLONOSCOPY WITH PROPOFOL;  Surgeon: Mansouraty, Telford Nab., MD;  Location: [facility];  Service: Gastroenterology;  Laterality: N/A;  ? ENDOSCOPIC MUCOSAL RESECTION N/A 11/29/2019  ? Procedure: ENDOSCOPIC MUCOSAL RESECTION;  Surgeon: Rush Landmark Telford Nab., MD;  Location: [facility];  Service: Gastroenterology;  Laterality: N/A;  ? ESOPHAGOGASTRODUODENOSCOPY (EGD) WITH PROPOFOL N/A 11/29/2019  ? Procedure: ESOPHAGOGASTRODUODENOSCOPY (EGD) WITH PROPOFOL;  Surgeon: Rush Landmark Telford Nab., MD;  Location: [facility];  Service: Gastroenterology;  Laterality: N/A;  ? EYE SURGERY Left   ? had to have eye flushed after getting costic sodium in L eye  ? HEMOSTASIS CLIP PLACEMENT  11/29/2019  ? Procedure: HEMOSTASIS CLIP PLACEMENT;  Surgeon: Irving Copas., MD;  Location: [facility];  Service:  Gastroenterology;;  ? HERNIA REPAIR    ? HERNIA REPAIR    ? 35 years ago  ? HOT HEMOSTASIS N/A 11/29/2019  ? Procedure: HOT HEMOSTASIS (ARGON PLASMA COAGULATION/BICAP);  Surgeon: Irving Copas., MD;  Location: [facility];  Service: Gastroenterology;  Laterality: N/A;  ? LEFT HEART CATH AND CORONARY ANGIOGRAPHY N/A 04/11/2019  ? Procedure: LEFT HEART CATH AND CORONARY ANGIOGRAPHY;  Surgeon: Adrian Prows, MD;  Location: [facility];  Service: Cardiovascular;  Laterality: N/A;  ? POLYPECTOMY  11/29/2019  ? Procedure: POLYPECTOMY;  Surgeon: Irving Copas., MD;  Location: [facility];  Service: Gastroenterology;;  ? RENAL ANGIOGRAPHY Bilateral 04/11/2019  ? Procedure: RENAL ANGIOGRAPHY;  Surgeon: Adrian Prows, MD;  Location: [facility];  Service: Cardiovascular;  Laterality: Bilateral;  ? SUBMUCOSAL LIFTING INJECTION  11/29/2019  ? Procedure: SUBMUCOSAL LIFTING INJECTION;  Surgeon: Irving Copas., MD;  Location: [facility];  Service: Gastroenterology;;  ? TOTAL KNEE ARTHROPLASTY Left 08/01/2019  ? Procedure: LEFT TOTAL KNEE ARTHROPLASTY;  Surgeon: Meredith Pel, MD;  Location: [facility];  Service: Orthopedics;  Laterality: Left;  ? ? ?There were no vitals filed for this visit. ? ? Subjective Assessment - 04/03/21 0936   ? ? Subjective "We talked about breathing exercises last time."   ? Currently in Pain? No/denies   ? ?  ?  ? ?  ? ? ? ? ? ? ? ? ADULT SLP TREATMENT - 04/03/21 1011   ? ?  ?  General Information  ? Behavior/Cognition Alert;Cooperative   ?  ? Treatment Provided  ? Treatment provided Cognitive-Linquistic   ?  ? Cognitive-Linquistic Treatment  ? Treatment focused on Cognition   ? Skilled Treatment Completed edu/training on external memory strategies and began internal memory strategies to assist with recall of information during conversations. Pt would like to learn how to work his smart device to aid memory. To address next session.   ?  ? Assessment / Recommendations /  Plan  ? Plan Continue with current plan of care   ?  ? Progression Toward Goals  ? Progression toward goals Progressing toward goals   ? ?  ?  ? ?  ? ? ? ? ? SLP Short Term Goals - 04/01/21 1536   ? ?  ? SLP SHORT TERM GOAL #1  ? Title Pt will increase auditory comprehension by recalling and verbalizing strategies to assist with active listening during conversation with minA.   ? Time 4   ? Period Weeks   ? Status Achieved   ? Target Date 04/01/21   ?  ? SLP SHORT TERM GOAL #2  ? Title Pt will comprehend functional memory or visual aids for recall of important information during structured conversations with minA.   ? Time 4   ? Period Weeks   ? Status On-going   Not met  ? Target Date 04/01/21   ?  ? SLP SHORT TERM GOAL #3  ? Title Pt will complete BNT naming test to identify anomia.   ? Time 3   ? Period Weeks   ? Status On-going   Not met  ? Target Date 03/25/21   ? ?  ?  ? ?  ? ? ? SLP Long Term Goals - 03/04/21 1249   ? ?  ? SLP LONG TERM GOAL #1  ? Title Pt will increase auditory comprehension by recalling and verbalizing strategies to assist with active listening during  structured conversation independently.   ? Time 8   ? Period Weeks   ? Status New   ? Target Date 04/29/21   ?  ? SLP LONG TERM GOAL #2  ? Title Pt will comprehend functional memory or visual aids for recall of important information during structured conversation independently   ? Time 8   ? Period Weeks   ? Status New   ? Target Date 04/29/21   ? ?  ?  ? ?  ? ? ? Plan - 04/03/21 1013   ? ? Clinical Impression Statement See tx note.  Cont with current POC.   ? Speech Therapy Frequency 2x / week   ? Duration 8 weeks   ? Treatment/Interventions Language facilitation;Environmental controls;Cueing hierarchy;SLP instruction and feedback;Cognitive reorganization;Functional tasks;Compensatory strategies;Internal/external aids;Multimodal communcation approach;Patient/family education   ? Potential to Achieve Goals Good   ? Consulted and Agree with  Plan of Care Patient;Family member/caregiver   ? Family Member Consulted Brother   ? ?  ?  ? ?  ? ? ?Patient will benefit from skilled therapeutic intervention in order to improve the following deficits and impairments:   ?Cognitive communication deficit ? ? ? ?Problem List ?Patient Active Problem List  ? Diagnosis Date Noted  ? AKI (acute kidney injury) ([place]) 02/24/2021  ? Acute CVA (cerebrovascular accident) ([place]) 02/19/2021  ? Hyperlipidemia 02/19/2021  ? Anemia 10/12/2019  ? Cecal polyp 09/30/2019  ? History of colon polyps 09/30/2019  ? Abnormal colonoscopy 09/30/2019  ? Arthritis of left knee 08/01/2019  ?  Type 2 diabetes mellitus without complication, without long-term current use of insulin ([place]) 05/01/2019  ? Dyspnea on exertion 04/10/2019  ? Abnormal nuclear stress test 04/10/2019  ? Essential hypertension 02/27/2019  ? Acute pain of left knee 02/27/2019  ? ? ?Verdene Lennert, CCC-SLP ?04/03/2021, 10:13 AM ? ?[facility] ?[place] ?[address] ?[place], [place], [address] ?Phone: [phone]   Fax:  [phone] ? ? ?Name: Joe Stewart ?MRN: [id number] ?Date of Birth: August 21, 1955 ? ?

## 2021-04-08 ENCOUNTER — Ambulatory Visit: Payer: Medicare Other | Admitting: Speech Pathology

## 2021-04-08 ENCOUNTER — Ambulatory Visit: Payer: Medicare Other | Admitting: Occupational Therapy

## 2021-04-08 ENCOUNTER — Ambulatory Visit: Payer: Medicare Other | Admitting: Cardiology

## 2021-04-09 ENCOUNTER — Encounter: Payer: Self-pay | Admitting: Podiatry

## 2021-04-09 ENCOUNTER — Other Ambulatory Visit: Payer: Self-pay

## 2021-04-09 ENCOUNTER — Ambulatory Visit: Payer: Medicare Other | Admitting: Podiatry

## 2021-04-09 DIAGNOSIS — L97511 Non-pressure chronic ulcer of other part of right foot limited to breakdown of skin: Secondary | ICD-10-CM

## 2021-04-09 DIAGNOSIS — E11621 Type 2 diabetes mellitus with foot ulcer: Secondary | ICD-10-CM

## 2021-04-09 DIAGNOSIS — I10 Essential (primary) hypertension: Secondary | ICD-10-CM | POA: Diagnosis not present

## 2021-04-09 NOTE — Progress Notes (Signed)
?  Subjective:  ?Patient ID: Joe Stewart, male    DOB: 11/01/1955,   MRN: [id number] ? ?Chief Complaint  ?Patient presents with  ? Wound Check  ?  Wound check follow up  ? ? ?66 y.o. male presents for folow-up of right foot wound. Relates it seems to be getting better and has tried up and scabbed. No pain He is diabetic and last A1c was 12.3  . Denies any other pedal complaints. Denies n/v/f/c.  ? ?PCP: [name] MD  ? ?Past Medical History:  ?Diagnosis Date  ? Arthritis   ? Cataract   ? Mixed OU  ? Diabetes mellitus without complication ([place])   ? Diabetic retinopathy ([place])   ? NPDR OU  ? Hyperlipidemia   ? Hypertension   ? Hypertensive retinopathy   ? OU  ? Obesity   ? ? ?Objective:  ?Physical Exam: ?Vascular: DP/PT pulses 2/4 bilateral. CFT <3 seconds. Normal hair growth on digits. No edema.  ?Skin. No lacerations or abrasions bilateral feet. Right fifth lateral digit with wound noted measuring about 0.3 cm x 0.3 cm x 0.1 cm with granular base. Undelrying hyperkeratotic tissue No erythema edema or purulence noted.  ?Musculoskeletal: MMT 5/5 bilateral lower extremities in DF, PF, Inversion and Eversion. Deceased ROM in DF of ankle joint.  ?Neurological: Sensation intact to light touch.  ? ?Assessment:  ? ?1. Diabetic ulcer of toe of right foot associated with type 2 diabetes mellitus, limited to breakdown of skin ([place])   ? ? ? ? ?Plan:  ?Patient was evaluated and treated and all questions answered. ?Ulcer right fifth toe limited to breakdown of skin  ?-Debridement as below. ?-Dressed with betadine, DSD. ?-Off-loading with surgical shoe. [place] here today in regular shoes. Toe caps dispensed as suspect he is not always in shoe.  ?-No abx indicated.  ?-Discussed glucose control and proper protein-rich diet.  ?-Discussed if any worsening redness, pain, fever or chills to call or may need to report to the emergency room. Patient expressed understanding.  ? ?Procedure: Excisional Debridement of Wound ?Rationale:  Removal of non-viable soft tissue from the wound to promote healing.  ?Anesthesia: none ?Pre-Debridement Wound Measurements: Overlying callus  ?Post-Debridement Wound Measurements: 0.3 cm x 0.3 cm x 0.1 cm  ?Type of Debridement: Sharp Excisional ?Tissue Removed: Non-viable soft tissue ?Depth of Debridement: subcutaneous tissue. ?Technique: Sharp excisional debridement to bleeding, viable wound base.  ?Dressing: Dry, sterile, compression dressing. ?Disposition: Patient tolerated procedure well. Patient to return in 2 week for follow-up. ? ?Return in about 2 weeks (around 04/23/2021) for wound check. ? ?Return in about 2 weeks (around 04/23/2021) for wound check. ? ? ?Lorenda Peck, DPM  ? ? ?

## 2021-04-10 ENCOUNTER — Encounter: Payer: Self-pay | Admitting: Speech Pathology

## 2021-04-10 ENCOUNTER — Ambulatory Visit: Payer: Medicare Other | Admitting: Occupational Therapy

## 2021-04-10 ENCOUNTER — Ambulatory Visit: Payer: Medicare Other | Admitting: Speech Pathology

## 2021-04-10 DIAGNOSIS — R278 Other lack of coordination: Secondary | ICD-10-CM | POA: Diagnosis not present

## 2021-04-10 DIAGNOSIS — R41840 Attention and concentration deficit: Secondary | ICD-10-CM

## 2021-04-10 DIAGNOSIS — I69351 Hemiplegia and hemiparesis following cerebral infarction affecting right dominant side: Secondary | ICD-10-CM | POA: Diagnosis not present

## 2021-04-10 DIAGNOSIS — R41841 Cognitive communication deficit: Secondary | ICD-10-CM

## 2021-04-10 DIAGNOSIS — R2689 Other abnormalities of gait and mobility: Secondary | ICD-10-CM | POA: Diagnosis not present

## 2021-04-10 DIAGNOSIS — R262 Difficulty in walking, not elsewhere classified: Secondary | ICD-10-CM | POA: Diagnosis not present

## 2021-04-10 DIAGNOSIS — M6281 Muscle weakness (generalized): Secondary | ICD-10-CM | POA: Diagnosis not present

## 2021-04-10 DIAGNOSIS — R41842 Visuospatial deficit: Secondary | ICD-10-CM

## 2021-04-10 DIAGNOSIS — R29818 Other symptoms and signs involving the nervous system: Secondary | ICD-10-CM

## 2021-04-10 DIAGNOSIS — M25662 Stiffness of left knee, not elsewhere classified: Secondary | ICD-10-CM | POA: Diagnosis not present

## 2021-04-10 DIAGNOSIS — R2681 Unsteadiness on feet: Secondary | ICD-10-CM | POA: Diagnosis not present

## 2021-04-10 LAB — BASIC METABOLIC PANEL WITH GFR
BUN/Creatinine Ratio: 28 — ABNORMAL HIGH (ref 10–24)
BUN: 44 mg/dL — ABNORMAL HIGH (ref 8–27)
CO2: 18 mmol/L — ABNORMAL LOW (ref 20–29)
Calcium: 9.9 mg/dL (ref 8.6–10.2)
Chloride: 104 mmol/L (ref 96–106)
Creatinine, Ser: 1.55 mg/dL — ABNORMAL HIGH (ref 0.76–1.27)
Glucose: 189 mg/dL — ABNORMAL HIGH (ref 70–99)
Potassium: 5.3 mmol/L — ABNORMAL HIGH (ref 3.5–5.2)
Sodium: 143 mmol/L (ref 134–144)
eGFR: 49 mL/min/1.73 — ABNORMAL LOW

## 2021-04-10 LAB — MAGNESIUM: Magnesium: 1.8 mg/dL (ref 1.6–2.3)

## 2021-04-10 NOTE — Therapy (Signed)
[facility] [address] [place], [place], [address] Phone: [phone]   Fax:  [phone]  Occupational Therapy Treatment  Patient Details  Name: Joe Stewart MRN: [id number] Date of Birth: [birth year] Referring Provider (OT): Osvaldo Shipper, MD   Encounter Date: 04/10/2021   OT End of Session - 04/10/21 0905     Visit Number 9    Number of Visits 17    Date for OT Re-Evaluation 05/27/21    Authorization Type [employer]    OT Start Time 0845    OT Stop Time 0930    OT Time Calculation (min) 45 min    Activity Tolerance Patient tolerated treatment well    Behavior During Therapy [facility] for tasks assessed/performed            Past Medical History:  Diagnosis Date   Arthritis    Cataract    Mixed OU   Diabetes mellitus without complication (HCC)    Diabetic retinopathy (HCC)    NPDR OU   Hyperlipidemia    Hypertension    Hypertensive retinopathy    OU   Obesity     Past Surgical History:  Procedure Laterality Date   BIOPSY  11/29/2019   Procedure: BIOPSY;  Surgeon: Lemar Lofty., MD;  Location: [facility];  Service: Gastroenterology;;   COLONOSCOPY WITH PROPOFOL N/A 11/29/2019   Procedure: COLONOSCOPY WITH PROPOFOL;  Surgeon: Lemar Lofty., MD;  Location: [facility];  Service: Gastroenterology;  Laterality: N/A;   ENDOSCOPIC MUCOSAL RESECTION N/A 11/29/2019   Procedure: ENDOSCOPIC MUCOSAL RESECTION;  Surgeon: Meridee Score Netty Starring., MD;  Location: [facility];  Service: Gastroenterology;  Laterality: N/A;   ESOPHAGOGASTRODUODENOSCOPY (EGD) WITH PROPOFOL N/A 11/29/2019   Procedure: ESOPHAGOGASTRODUODENOSCOPY (EGD) WITH PROPOFOL;  Surgeon: Meridee Score Netty Starring., MD;  Location: [facility];  Service: Gastroenterology;  Laterality: N/A;   EYE SURGERY Left    had to have eye flushed after getting costic sodium in L eye   HEMOSTASIS CLIP PLACEMENT  11/29/2019   Procedure:  HEMOSTASIS CLIP PLACEMENT;  Surgeon: Lemar Lofty., MD;  Location: [facility];  Service: Gastroenterology;;   HERNIA REPAIR     HERNIA REPAIR     35 years ago   HOT HEMOSTASIS N/A 11/29/2019   Procedure: HOT HEMOSTASIS (ARGON PLASMA COAGULATION/BICAP);  Surgeon: Lemar Lofty., MD;  Location: [facility];  Service: Gastroenterology;  Laterality: N/A;   LEFT HEART CATH AND CORONARY ANGIOGRAPHY N/A 04/11/2019   Procedure: LEFT HEART CATH AND CORONARY ANGIOGRAPHY;  Surgeon: Yates Decamp, MD;  Location: [facility]AS[facility];  Service: Cardiovascular;  Laterality: N/A;   POLYPECTOMY  11/29/2019   Procedure: POLYPECTOMY;  Surgeon: Meridee Score Netty Starring., MD;  Location: [facility];  Service: Gastroenterology;;   RENAL ANGIOGRAPHY Bilateral 04/11/2019   Procedure: RENAL ANGIOGRAPHY;  Surgeon: Yates Decamp, MD;  Location: [facility] INVASIVE CV LAB;  Service: Cardiovascular;  Laterality: Bilateral;   SUBMUCOSAL LIFTING INJECTION  11/29/2019   Procedure: SUBMUCOSAL LIFTING INJECTION;  Surgeon: Lemar Lofty., MD;  Location: [facility];  Service: Gastroenterology;;   TOTAL KNEE ARTHROPLASTY Left 08/01/2019   Procedure: LEFT TOTAL KNEE ARTHROPLASTY;  Surgeon: Cammy Copa, MD;  Location: [facility] OR;  Service: Orthopedics;  Laterality: Left;    There were no vitals filed for this visit.   Subjective Assessment - 04/10/21 0852     Subjective  Pt reports he had an episode of dizziness, imbalance, and tunnel vision a few days ago, which caused him to "drop  to one knee" without realizing he was "going down." Pt believes this was related to not eating well/typically that day.    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 No/denies             OT Education - 04/10/21 0859     Education Details Continued condition-specific education related to secondary stroke prevention and recognition of signs and symptoms of a stroke (BEFAST w/ wallet card provided)    Person(s) Educated Patient    Methods Explanation    Comprehension Verbalized understanding             OT Treatment - 04/10/21    IADLs: Health Maintenance OT problem-solved w/ pt to identify a pt-specific tracker to help pt record and potentially recognize patterns related to episodes of lightheadedness. OT also discussed benefit of maintaining this log to share w/ relevant providers prn. Log included date and time of medications taken, food/drink, and blood sugar and BP readings; printed handout administered to pt at conclusion of session.    Handwriting Facilitated practice of handwriting using standard pen and pen w/ built-up handle (tan) while completing categorization activity requiring pt to write adjectives into the correct categories. Required increased time for categorizing adjectives and demonstrated decreased control/smoothness of lines and speed/fluency of letters; slightly increased success w/ built-up handle, which was issued to pt as conclusion of session.            OT Short Term Goals - 03/14/21 1004       OT SHORT TERM GOAL #1   Title Pt will verbalize understanding of visual compensatory strategies to incorporate during tabletop/near visual acuity activities    Baseline No visual compensatory strategies    Time 2    Period Weeks    Status Achieved   03/11/21   Target Date 03/18/21      OT SHORT TERM GOAL #2   Title Pt will be able to complete symbol cancellation task, using AE/compensatory strategies prn w/ 100% accuracy    Baseline  Completed 50% of page w/ 14/15 correct    Time 4    Period Weeks    Status Achieved   03/14/21 - 24/24 using line guide   Target Date 04/01/21             OT Long Term Goals - 04/03/21 1027       OT LONG TERM GOAL #1   Title Pt will demonstrate improved participation in functional FM tasks as evidenced by decreasing 9-HPT time by at least 5 seconds w/ R, dominant hand    Baseline 44 sec (L hand 36 sec)    Time 8    Period Weeks    Status Achieved   03/18/21 - 35 sec   Target Date 04/29/21      OT LONG TERM GOAL #2   Title Pt will be able to sort medications (simulated or actual) correctly, using AE/compensatory strategies prn    Baseline Not currently managing medication    Time 8    Period Weeks  Status On-going    Target Date 04/29/21      OT LONG TERM GOAL #3   Title Pt will be able to complete simulated meal prep activity w/ Mod I    Baseline Not currently participating in meal prep    Time 8    Period Weeks    Status Achieved   04/03/21 - completing independently at home, per pt report   Target Date 04/29/21      OT LONG TERM GOAL #4   Title Pt will demonstrate independence w/ compensatory strategies, including AE prn, to improve handwriting legibility and efficiency    Baseline Fair legibility; tremor observed    Time 8    Period Weeks    Status On-going    Target Date 04/29/21      OT LONG TERM GOAL #5   Title Pt will be able to complete TMT: Part B in less than 1 min, 30 sec to indicate potential appropriateness for initiating return to driving    Baseline Over 1 min, 30 sec    Time 8    Period Weeks    Status On-going    Target Date 04/29/21             Plan - 04/10/21 0906     Clinical Impression Statement OT facilitated lengthly discussion w/ pt related to condition-specific education and benefit of following up w/ his provider due to pt's report of episode of imbalance, tunnel vision, and near fall, particularly related to pt's report of  persistence of episodes, medication adjustments, and considerations w/ pt taking a blood thinner. Pt verbalized understanding. OT also addressed handwriting w/ increased demand on cognition (organization, memory, attention) incorporated into task. Pt continues to experience difficulty w/ higher level executive functioning.    OT Occupational Profile and History Detailed Assessment- Review of Records and additional review of physical, cognitive, psychosocial history related to current functional performance    Occupational performance deficits (Please refer to evaluation for details): ADL's;IADL's;Leisure;Social Participation    Body Structure / Function / Physical Skills ADL;Decreased knowledge of use of DME;Strength;Balance;Dexterity;GMC;UE functional use;IADL;Vision;Coordination;FMC;Decreased knowledge of precautions    Cognitive Skills Memory;Safety Awareness    Psychosocial Skills Environmental  Adaptations    Rehab Potential Good    Clinical Decision Making Limited treatment options, no task modification necessary    Comorbidities Affecting Occupational Performance: May have comorbidities impacting occupational performance    Modification or Assistance to Complete Evaluation  No modification of tasks or assist necessary to complete eval    OT Frequency 2x / week    OT Duration 8 weeks    OT Treatment/Interventions Self-care/ADL training;DME and/or AE instruction;Balance training;Therapeutic activities;Therapeutic exercise;Visual/perceptual remediation/compensation;Neuromuscular education;Energy conservation;Manual Therapy;Patient/family education;Cognitive remediation/compensation    Plan IADL activity (meal prep/shopping, planning for day, etc.); continue to address handwriting and FMC; review putty exercises prn    Recommended Other Services Currently receiving PT/ST services at this location    Consulted and Agree with Plan of Care Patient            Patient will benefit from skilled  therapeutic intervention in order to improve the following deficits and impairments:   Body Structure / Function / Physical Skills: ADL, Decreased knowledge of use of DME, Strength, Balance, Dexterity, GMC, UE functional use, IADL, Vision, Coordination, FMC, Decreased knowledge of precautions Cognitive Skills: Memory, Safety Awareness Psychosocial Skills: Environmental  Adaptations   Visit Diagnosis: Attention and concentration deficit  Other symptoms and signs involving the nervous system  Other lack  of coordination  Visuospatial deficit   Problem List Patient Active Problem List   Diagnosis Date Noted   AKI (acute kidney injury) (HCC) 02/24/2021   Acute CVA (cerebrovascular accident) (HCC) 02/19/2021   Hyperlipidemia 02/19/2021   Anemia 10/12/2019   Cecal polyp 09/30/2019   History of colon polyps 09/30/2019   Abnormal colonoscopy 09/30/2019   Arthritis of left knee 08/01/2019   Type 2 diabetes mellitus without complication, without long-term current use of insulin (HCC) 05/01/2019   Dyspnea on exertion 04/10/2019   Abnormal nuclear stress test 04/10/2019   Essential hypertension 02/27/2019   Acute pain of left knee 02/27/2019    Rosie Fate, MSOT, OTR/L 04/10/2021, 9:33 AM  [facility] [address] [place], [place], [address] Phone: [phone]   Fax:  [phone]  Name: Joe Stewart MRN: [id number] Date of Birth: 01/20/1956

## 2021-04-10 NOTE — Therapy (Signed)
?[facility] [address] ?[place], [place], [address] ?Phone: [phone]   Fax:  [phone] ? ?Speech Language Pathology Treatment ? ?Patient Details  ?Name: Joe Stewart ?MRN: [id number] ?Date of Birth: 1955/09/16 ?Referring Provider (SLP): Bonnielee Haff, MD ? ? ?Encounter Date: 04/10/2021 ? ? End of Session - 04/10/21 0807   ? ? Visit Number 9   ? Number of Visits 17   ? Date for SLP Re-Evaluation 06/01/21   ? SLP Start Time [phone]   ? SLP Stop Time  [phone]   ? SLP Time Calculation (min) 40 min   ? Activity Tolerance Patient tolerated treatment well   ? ?  ?  ? ?  ? ? ?Past Medical History:  ?Diagnosis Date  ? Arthritis   ? Cataract   ? Mixed OU  ? Diabetes mellitus without complication ([place])   ? Diabetic retinopathy ([place])   ? NPDR OU  ? Hyperlipidemia   ? Hypertension   ? Hypertensive retinopathy   ? OU  ? Obesity   ? ? ?Past Surgical History:  ?Procedure Laterality Date  ? BIOPSY  11/29/2019  ? Procedure: BIOPSY;  Surgeon: Irving Copas., MD;  Location: [facility];  Service: Gastroenterology;;  ? COLONOSCOPY WITH PROPOFOL N/A 11/29/2019  ? Procedure: COLONOSCOPY WITH PROPOFOL;  Surgeon: Mansouraty, Telford Nab., MD;  Location: [facility];  Service: Gastroenterology;  Laterality: N/A;  ? ENDOSCOPIC MUCOSAL RESECTION N/A 11/29/2019  ? Procedure: ENDOSCOPIC MUCOSAL RESECTION;  Surgeon: Rush Landmark Telford Nab., MD;  Location: [facility];  Service: Gastroenterology;  Laterality: N/A;  ? ESOPHAGOGASTRODUODENOSCOPY (EGD) WITH PROPOFOL N/A 11/29/2019  ? Procedure: ESOPHAGOGASTRODUODENOSCOPY (EGD) WITH PROPOFOL;  Surgeon: Rush Landmark Telford Nab., MD;  Location: [facility];  Service: Gastroenterology;  Laterality: N/A;  ? EYE SURGERY Left   ? had to have eye flushed after getting costic sodium in L eye  ? HEMOSTASIS CLIP PLACEMENT  11/29/2019  ? Procedure: HEMOSTASIS CLIP PLACEMENT;  Surgeon: Irving Copas., MD;  Location: [facility];  Service:  Gastroenterology;;  ? HERNIA REPAIR    ? HERNIA REPAIR    ? 35 years ago  ? HOT HEMOSTASIS N/A 11/29/2019  ? Procedure: HOT HEMOSTASIS (ARGON PLASMA COAGULATION/BICAP);  Surgeon: Irving Copas., MD;  Location: [facility];  Service: Gastroenterology;  Laterality: N/A;  ? LEFT HEART CATH AND CORONARY ANGIOGRAPHY N/A 04/11/2019  ? Procedure: LEFT HEART CATH AND CORONARY ANGIOGRAPHY;  Surgeon: Adrian Prows, MD;  Location: [facility] CV LAB;  Service: Cardiovascular;  Laterality: N/A;  ? POLYPECTOMY  11/29/2019  ? Procedure: POLYPECTOMY;  Surgeon: Irving Copas., MD;  Location: [facility];  Service: Gastroenterology;;  ? RENAL ANGIOGRAPHY Bilateral 04/11/2019  ? Procedure: RENAL ANGIOGRAPHY;  Surgeon: Adrian Prows, MD;  Location: [facility] CV LAB;  Service: Cardiovascular;  Laterality: Bilateral;  ? SUBMUCOSAL LIFTING INJECTION  11/29/2019  ? Procedure: SUBMUCOSAL LIFTING INJECTION;  Surgeon: Irving Copas., MD;  Location: [facility];  Service: Gastroenterology;;  ? TOTAL KNEE ARTHROPLASTY Left 08/01/2019  ? Procedure: LEFT TOTAL KNEE ARTHROPLASTY;  Surgeon: Meredith Pel, MD;  Location: [facility];  Service: Orthopedics;  Laterality: Left;  ? ? ?There were no vitals filed for this visit. ? ? Subjective Assessment - 04/10/21 0804   ? ? Subjective "I don't think I ate right the other day and I felt funny and fell."   ? Currently in Pain? No/denies   ? ?  ?  ? ?  ? ? ? ? ? ? ? ?  ADULT SLP TREATMENT - 04/10/21 1350   ? ?  ? General Information  ? Behavior/Cognition Alert;Cooperative   ?  ? Treatment Provided  ? Treatment provided Cognitive-Linquistic   ?  ? Cognitive-Linquistic Treatment  ? Treatment focused on Cognition   ? Skilled Treatment Cont edu on memory strategies. Pt is currently not using any newly introduced strategies at this time, but reports he will attempt to use more at home. SLP rec continued practice of strategies to increase likelihood of generalization into daily life. Trained  pt using organization for recall of information (i.e. grocery list). Pt was able to recall more items using organization strategy than with randomized order of 10 grocery items. Will continue to practice internal memory strategies to aid with recall of items and conversations.   ?  ? Assessment / Recommendations / Plan  ? Plan Continue with current plan of care   ?  ? Progression Toward Goals  ? Progression toward goals Progressing toward goals   ? ?  ?  ? ?  ? ? ? ? ? SLP Short Term Goals - 04/01/21 1536   ? ?  ? SLP SHORT TERM GOAL #1  ? Title Pt will increase auditory comprehension by recalling and verbalizing strategies to assist with active listening during conversation with minA.   ? Time 4   ? Period Weeks   ? Status Achieved   ? Target Date 04/01/21   ?  ? SLP SHORT TERM GOAL #2  ? Title Pt will comprehend functional memory or visual aids for recall of important information during structured conversations with minA.   ? Time 4   ? Period Weeks   ? Status On-going   Not met  ? Target Date 04/01/21   ?  ? SLP SHORT TERM GOAL #3  ? Title Pt will complete BNT naming test to identify anomia.   ? Time 3   ? Period Weeks   ? Status On-going   Not met  ? Target Date 03/25/21   ? ?  ?  ? ?  ? ? ? SLP Long Term Goals - 03/04/21 1249   ? ?  ? SLP LONG TERM GOAL #1  ? Title Pt will increase auditory comprehension by recalling and verbalizing strategies to assist with active listening during  structured conversation independently.   ? Time 8   ? Period Weeks   ? Status New   ? Target Date 04/29/21   ?  ? SLP LONG TERM GOAL #2  ? Title Pt will comprehend functional memory or visual aids for recall of important information during structured conversation independently   ? Time 8   ? Period Weeks   ? Status New   ? Target Date 04/29/21   ? ?  ?  ? ?  ? ? ? Plan - 04/10/21 0813   ? ? Clinical Impression Statement See tx note.  Cont with current POC. **Pt reported an episode of where he "didn't feel right", had a vision  change, and  "all of the sudden fell onto one knee". He reported after eating, he felt better. SLP encouraged pt to reach out to doctor to inform them of episode and edu on s/sx of stroke.   ? Speech Therapy Frequency 2x / week   ? Duration 8 weeks   ? Treatment/Interventions Language facilitation;Environmental controls;Cueing hierarchy;SLP instruction and feedback;Cognitive reorganization;Functional tasks;Compensatory strategies;Internal/external aids;Multimodal communcation approach;Patient/family education   ? Potential to Achieve Goals Good   ? Consulted  and Agree with Plan of Care Patient;Family member/caregiver   ? Family Member Consulted Brother   ? ?  ?  ? ?  ? ? ?Patient will benefit from skilled therapeutic intervention in order to improve the following deficits and impairments:   ?Cognitive communication deficit ? ? ? ?Problem List ?Patient Active Problem List  ? Diagnosis Date Noted  ? AKI (acute kidney injury) ([place]) 02/24/2021  ? Acute CVA (cerebrovascular accident) ([place]) 02/19/2021  ? Hyperlipidemia 02/19/2021  ? Anemia 10/12/2019  ? Cecal polyp 09/30/2019  ? History of colon polyps 09/30/2019  ? Abnormal colonoscopy 09/30/2019  ? Arthritis of left knee 08/01/2019  ? Type 2 diabetes mellitus without complication, without long-term current use of insulin ([place]) 05/01/2019  ? Dyspnea on exertion 04/10/2019  ? Abnormal nuclear stress test 04/10/2019  ? Essential hypertension 02/27/2019  ? Acute pain of left knee 02/27/2019  ? ? ?Verdene Lennert, [place] ?04/10/2021, 1:56 PM ? ?[facility] ?[address] ?[place], [place], [address] ?Phone: [phone]   Fax:  [phone] ? ? ?Name: Joe Stewart ?MRN: [id number] ?Date of Birth: 1955-07-27 ? ?

## 2021-04-15 ENCOUNTER — Encounter: Payer: Self-pay | Admitting: Occupational Therapy

## 2021-04-15 ENCOUNTER — Ambulatory Visit: Payer: Medicare Other | Admitting: Physical Therapy

## 2021-04-15 ENCOUNTER — Encounter: Payer: Self-pay | Admitting: Speech Pathology

## 2021-04-15 ENCOUNTER — Encounter: Payer: Self-pay | Admitting: Registered Nurse

## 2021-04-15 ENCOUNTER — Encounter: Payer: Medicare Other | Admitting: Occupational Therapy

## 2021-04-15 ENCOUNTER — Other Ambulatory Visit: Payer: Self-pay

## 2021-04-15 ENCOUNTER — Ambulatory Visit (INDEPENDENT_AMBULATORY_CARE_PROVIDER_SITE_OTHER): Payer: Medicare Other | Admitting: Registered Nurse

## 2021-04-15 ENCOUNTER — Encounter: Payer: Self-pay | Admitting: Physical Therapy

## 2021-04-15 ENCOUNTER — Ambulatory Visit: Payer: Medicare Other | Admitting: Occupational Therapy

## 2021-04-15 ENCOUNTER — Ambulatory Visit: Payer: Medicare Other | Admitting: Speech Pathology

## 2021-04-15 ENCOUNTER — Telehealth: Payer: Self-pay

## 2021-04-15 VITALS — BP 105/61 | HR 81 | Temp 98.0°F | Resp 18 | Ht 68.0 in | Wt 258.0 lb

## 2021-04-15 DIAGNOSIS — E119 Type 2 diabetes mellitus without complications: Secondary | ICD-10-CM

## 2021-04-15 DIAGNOSIS — M6281 Muscle weakness (generalized): Secondary | ICD-10-CM | POA: Diagnosis not present

## 2021-04-15 DIAGNOSIS — I69351 Hemiplegia and hemiparesis following cerebral infarction affecting right dominant side: Secondary | ICD-10-CM | POA: Diagnosis not present

## 2021-04-15 DIAGNOSIS — R262 Difficulty in walking, not elsewhere classified: Secondary | ICD-10-CM | POA: Diagnosis not present

## 2021-04-15 DIAGNOSIS — R2689 Other abnormalities of gait and mobility: Secondary | ICD-10-CM | POA: Diagnosis not present

## 2021-04-15 DIAGNOSIS — R41841 Cognitive communication deficit: Secondary | ICD-10-CM

## 2021-04-15 DIAGNOSIS — R278 Other lack of coordination: Secondary | ICD-10-CM | POA: Diagnosis not present

## 2021-04-15 DIAGNOSIS — R29818 Other symptoms and signs involving the nervous system: Secondary | ICD-10-CM | POA: Diagnosis not present

## 2021-04-15 DIAGNOSIS — M25662 Stiffness of left knee, not elsewhere classified: Secondary | ICD-10-CM | POA: Diagnosis not present

## 2021-04-15 DIAGNOSIS — R41842 Visuospatial deficit: Secondary | ICD-10-CM

## 2021-04-15 DIAGNOSIS — R2681 Unsteadiness on feet: Secondary | ICD-10-CM | POA: Diagnosis not present

## 2021-04-15 DIAGNOSIS — R41840 Attention and concentration deficit: Secondary | ICD-10-CM

## 2021-04-15 DIAGNOSIS — I10 Essential (primary) hypertension: Secondary | ICD-10-CM

## 2021-04-15 DIAGNOSIS — R42 Dizziness and giddiness: Secondary | ICD-10-CM | POA: Diagnosis not present

## 2021-04-15 LAB — LIPID PANEL
Cholesterol: 115 mg/dL (ref 0–200)
HDL: 33.5 mg/dL — ABNORMAL LOW (ref >39.00)
NonHDL: 81.81
Total CHOL/HDL Ratio: 3
Triglycerides: 212 mg/dL — ABNORMAL HIGH (ref 0.0–149.0)
VLDL: 42.4 mg/dL — ABNORMAL HIGH (ref 0.0–40.0)

## 2021-04-15 LAB — COMPREHENSIVE METABOLIC PANEL
ALT: 23 U/L (ref 0–53)
AST: 25 U/L (ref 0–37)
Albumin: 4.6 g/dL (ref 3.5–5.2)
Alkaline Phosphatase: 71 U/L (ref 39–117)
BUN: 47 mg/dL — ABNORMAL HIGH (ref 6–23)
CO2: 26 mEq/L (ref 19–32)
Calcium: 9.8 mg/dL (ref 8.4–10.5)
Chloride: 101 mEq/L (ref 96–112)
Creatinine, Ser: 1.59 mg/dL — ABNORMAL HIGH (ref 0.40–1.50)
GFR: 44.62 mL/min — ABNORMAL LOW (ref >60.00)
Glucose, Bld: 173 mg/dL — ABNORMAL HIGH (ref 70–99)
Potassium: 4.4 mEq/L (ref 3.5–5.1)
Sodium: 139 mEq/L (ref 135–145)
Total Bilirubin: 0.7 mg/dL (ref 0.2–1.2)
Total Protein: 7.5 g/dL (ref 6.0–8.3)

## 2021-04-15 LAB — CBC WITH DIFFERENTIAL/PLATELET
Basophils Absolute: 0 10*3/uL (ref 0.0–0.1)
Basophils Relative: 0.3 % (ref 0.0–3.0)
Eosinophils Absolute: 0 10*3/uL (ref 0.0–0.7)
Eosinophils Relative: 0.5 % (ref 0.0–5.0)
HCT: 38.6 % — ABNORMAL LOW (ref 39.0–52.0)
Hemoglobin: 13.1 g/dL (ref 13.0–17.0)
Lymphocytes Relative: 27.1 % (ref 12.0–46.0)
Lymphs Abs: 2.6 10*3/uL (ref 0.7–4.0)
MCHC: 34 g/dL (ref 30.0–36.0)
MCV: 91.8 fl (ref 78.0–100.0)
Monocytes Absolute: 0.6 10*3/uL (ref 0.1–1.0)
Monocytes Relative: 6.2 % (ref 3.0–12.0)
Neutro Abs: 6.3 10*3/uL (ref 1.4–7.7)
Neutrophils Relative %: 65.9 % (ref 43.0–77.0)
Platelets: 229 10*3/uL (ref 150.0–400.0)
RBC: 4.2 Mil/uL — ABNORMAL LOW (ref 4.22–5.81)
RDW: 14.6 % (ref 11.5–15.5)
WBC: 9.5 10*3/uL (ref 4.0–10.5)

## 2021-04-15 LAB — HEMOGLOBIN A1C: Hgb A1c MFr Bld: 8.3 % — ABNORMAL HIGH (ref 4.6–6.5)

## 2021-04-15 LAB — TSH: TSH: 1.55 u[IU]/mL (ref 0.35–5.50)

## 2021-04-15 LAB — LDL CHOLESTEROL, DIRECT: Direct LDL: 55 mg/dL

## 2021-04-15 MED ORDER — HYDROCHLOROTHIAZIDE 12.5 MG PO TABS
12.5000 mg | ORAL_TABLET | Freq: Every day | ORAL | 0 refills | Status: DC
Start: 2021-04-15 — End: 2021-05-20

## 2021-04-15 MED ORDER — DEXCOM G6 SENSOR MISC: 1 refills | Status: DC

## 2021-04-15 MED ORDER — DEXCOM G6 RECEIVER DEVI: 1.0000 | Freq: Once | 0 refills | Status: AC

## 2021-04-15 MED ORDER — DEXCOM G6 TRANSMITTER MISC: 1.0000 | 1 refills | Status: DC

## 2021-04-15 NOTE — Patient Instructions (Addendum)
Mr. Wert -  ? ?Great to see you ? ?In brief, here are meds: ? ?Hydrochlorthiazide 12.'5mg'$  daily. For blood pressure.  ?Amlodipine '10mg'$  daily. For blood pressure. ?Clonazepam 0.'25mg'$  sublingual twice daily as needed for anxiety. ?Empagliflozin '25mg'$  daily for diabetes ?Losartan '50mg'$  daily for blood pressure. ?Metoprolol '50mg'$  XR daily for blood pressure, heart rate. ?Atorvastatin 80 mg daily for cholesterol. Take in evenings.  ? ? ?Let me know how things go ? ?Thanks, ? ?Rich  ? ?If you have lab work done today you will be contacted with your lab results within the next 2 weeks.  If you have not heard from [place] then please contact us. The fastest way to get your results is to register for My Chart. ? ? ?IF you received an x-ray today, you will receive an invoice from [facility]. Please contact [facility] at [phone] with questions or concerns regarding your invoice.  ? ?IF you received labwork today, you will receive an invoice from [facility]. Please contact [facility] at [phone] with questions or concerns regarding your invoice.  ? ?Our billing staff will not be able to assist you with questions regarding bills from these companies. ? ?You will be contacted with the lab results as soon as they are available. The fastest way to get your results is to activate your My Chart account. Instructions are located on the last page of this paperwork. If you have not heard from [place] regarding the results in 2 weeks, please contact this office. ?  ? ? ?

## 2021-04-15 NOTE — Progress Notes (Signed)
? ?Established Patient Office Visit ? ?Subjective:  ?Patient ID: Joe Stewart, male    DOB: 12-19-1955  Age: 66 y.o. MRN: [id number] ? ?CC:  ?Chief Complaint  ?Patient presents with  ? Dizziness  ?  Patient states since the new medications from the hospital have been added he has been getting dizzy trying to get up and sometimes walking.patient fell down on one knee after not being able to eat right but after he feel he could eat.  ? ? ?HPI ?Joe Stewart presents for dizziness. ? ?Has had intermittent episodes since dc from hospital admission for L PCA infarct.  ?Did have VV with me on 03/28/21 where we suspected these were related to anxiety and acute panic attacks, however, clonzepam for intermittent use not effective. ? ?Orthostatic vitals obtained: ?Sitting: ?110/64, 98%, 82 pulse ?Laying: ?100/58, 97%,  80 pulse ?Standing: ?104/60, 96%, 86 pulse. Pt dizzy when standing.  ? ?He did have a rehab appt this morning ?BP there was 122/65, 133/73, glucose was 163.  ?After last session, as he was walking out, had tunnel vision and got dizzy. Had to sit down. Noted by nurse there to have turned pale.  ? ?He does note a specific incident at his sister's house a few weeks ago,  ?Similar occurrence with acute dizziness and had to sit down. ? ? ?Past Medical History:  ?Diagnosis Date  ? Arthritis   ? Cataract   ? Mixed OU  ? Diabetes mellitus without complication ([place])   ? Diabetic retinopathy ([place])   ? NPDR OU  ? Hyperlipidemia   ? Hypertension   ? Hypertensive retinopathy   ? OU  ? Obesity   ? ? ?Past Surgical History:  ?Procedure Laterality Date  ? BIOPSY  11/29/2019  ? Procedure: BIOPSY;  Surgeon: Irving Copas., MD;  Location: [facility];  Service: Gastroenterology;;  ? COLONOSCOPY WITH PROPOFOL N/A 11/29/2019  ? Procedure: COLONOSCOPY WITH PROPOFOL;  Surgeon: Mansouraty, Telford Nab., MD;  Location: [facility];  Service: Gastroenterology;  Laterality: N/A;  ? ENDOSCOPIC MUCOSAL RESECTION N/A 11/29/2019  ?  Procedure: ENDOSCOPIC MUCOSAL RESECTION;  Surgeon: Rush Landmark Telford Nab., MD;  Location: [facility];  Service: Gastroenterology;  Laterality: N/A;  ? ESOPHAGOGASTRODUODENOSCOPY (EGD) WITH PROPOFOL N/A 11/29/2019  ? Procedure: ESOPHAGOGASTRODUODENOSCOPY (EGD) WITH PROPOFOL;  Surgeon: Rush Landmark Telford Nab., MD;  Location: [facility];  Service: Gastroenterology;  Laterality: N/A;  ? EYE SURGERY Left   ? had to have eye flushed after getting costic sodium in L eye  ? HEMOSTASIS CLIP PLACEMENT  11/29/2019  ? Procedure: HEMOSTASIS CLIP PLACEMENT;  Surgeon: Irving Copas., MD;  Location: [facility];  Service: Gastroenterology;;  ? HERNIA REPAIR    ? HERNIA REPAIR    ? 35 years ago  ? HOT HEMOSTASIS N/A 11/29/2019  ? Procedure: HOT HEMOSTASIS (ARGON PLASMA COAGULATION/BICAP);  Surgeon: Irving Copas., MD;  Location: [facility];  Service: Gastroenterology;  Laterality: N/A;  ? LEFT HEART CATH AND CORONARY ANGIOGRAPHY N/A 04/11/2019  ? Procedure: LEFT HEART CATH AND CORONARY ANGIOGRAPHY;  Surgeon: Adrian Prows, MD;  Location: [place] [facility];  Service: Cardiovascular;  Laterality: N/A;  ? POLYPECTOMY  11/29/2019  ? Procedure: POLYPECTOMY;  Surgeon: Irving Copas., MD;  Location: [facility];  Service: Gastroenterology;;  ? RENAL ANGIOGRAPHY Bilateral 04/11/2019  ? Procedure: RENAL ANGIOGRAPHY;  Surgeon: Adrian Prows, MD;  Location: [facility] [facility];  Service: Cardiovascular;  Laterality: Bilateral;  ? SUBMUCOSAL LIFTING INJECTION  11/29/2019  ? Procedure: SUBMUCOSAL LIFTING INJECTION;  Surgeon: Irving Copas., MD;  Location: [facility];  Service: Gastroenterology;;  ? TOTAL KNEE ARTHROPLASTY Left 08/01/2019  ? Procedure: LEFT TOTAL KNEE ARTHROPLASTY;  Surgeon: Meredith Pel, MD;  Location: [facility];  Service: Orthopedics;  Laterality: Left;  ? ? ?Family History  ?Problem Relation Age of Onset  ? Diabetes Mother   ? COPD Mother   ? Cancer Mother   ?     unknown type   ? Heart  disease Father   ? COPD Sister   ? Healthy Brother   ? Heart disease Brother   ? Healthy Son   ? Colon cancer Neg Hx   ? Colon polyps Neg Hx   ? Esophageal cancer Neg Hx   ? Rectal cancer Neg Hx   ? Stomach cancer Neg Hx   ? Inflammatory bowel disease Neg Hx   ? Liver disease Neg Hx   ? Pancreatic cancer Neg Hx   ? ? ?Social History  ? ?Socioeconomic History  ? Marital status: Single  ?  Spouse name: Not on file  ? Number of children: 1  ? Years of education: Not on file  ? Highest education level: Not on file  ?Occupational History  ? Not on file  ?Tobacco Use  ? Smoking status: Never  ? Smokeless tobacco: Never  ?Vaping Use  ? Vaping Use: Never used  ?Substance and Sexual Activity  ? Alcohol use: Not Currently  ? Drug use: Never  ? Sexual activity: Not Currently  ?Other Topics Concern  ? Not on file  ?Social History Narrative  ? Not on file  ? ?Social Determinants of Health  ? ?Financial Resource Strain: Low Risk   ? Difficulty of Paying Living Expenses: Not hard at all  ?Food Insecurity: No Food Insecurity  ? Worried About Charity fundraiser in the Last Year: Never true  ? Ran Out of Food in the Last Year: Never true  ?Transportation Needs: No Transportation Needs  ? Lack of Transportation (Medical): No  ? Lack of Transportation (Non-Medical): No  ?Physical Activity: Not on file  ?Stress: Not on file  ?Social Connections: Not on file  ?Intimate Partner Violence: Not on file  ? ? ?Outpatient Medications Prior to Visit  ?Medication Sig Dispense Refill  ? amLODipine (NORVASC) 10 MG tablet Take 1 tablet (10 mg total) by mouth daily. 30 tablet 2  ? aspirin EC 81 MG tablet Take 1 tablet (81 mg total) by mouth daily. Swallow whole. 30 tablet 11  ? atorvastatin (LIPITOR) 80 MG tablet Take 1 tablet (80 mg total) by mouth daily. 30 tablet 2  ? blood glucose meter kit and supplies KIT Dispense based on patient and insurance preference. Use up to four times daily as directed. 1 each 0  ? clonazePAM (KLONOPIN) 0.25 MG  disintegrating tablet Take 1 tablet (0.25 mg total) by mouth 2 (two) times daily. 60 tablet 0  ? empagliflozin (JARDIANCE) 25 MG TABS tablet Take 1 tablet (25 mg total) by mouth daily before breakfast. 90 tablet 0  ? losartan (COZAAR) 50 MG tablet Take 1 tablet (50 mg total) by mouth daily at 10 pm. 90 tablet 0  ? metoprolol succinate (TOPROL-XL) 50 MG 24 hr tablet Take 1 tablet (50 mg total) by mouth daily. Take with or immediately following a meal. 90 tablet 3  ? hydrochlorothiazide (HYDRODIURIL) 25 MG tablet Take 1 tablet (25 mg total) by mouth every morning. 90 tablet 0  ? ?No facility-administered medications prior to  visit.  ? ? ?Not on File ? ?ROS ?Review of Systems  ?Constitutional: Negative.   ?HENT: Negative.    ?Eyes: Negative.   ?Respiratory: Negative.    ?Cardiovascular: Negative.   ?Gastrointestinal: Negative.   ?Endocrine: Negative.   ?Genitourinary: Negative.   ?Musculoskeletal: Negative.   ?Skin: Negative.   ?Allergic/Immunologic: Negative.   ?Neurological:  Positive for dizziness. Negative for tremors, seizures, syncope, facial asymmetry, speech difficulty, weakness, light-headedness, numbness and headaches.  ?Hematological: Negative.   ?Psychiatric/Behavioral: Negative.    ?All other systems reviewed and are negative. ? ?  ?Objective:  ?  ?Physical Exam ?Constitutional:   ?   General: He is not in acute distress. ?   Appearance: Normal appearance. He is normal weight. He is not ill-appearing, toxic-appearing or diaphoretic.  ?Cardiovascular:  ?   Rate and Rhythm: Normal rate and regular rhythm.  ?   Heart sounds: Normal heart sounds. No murmur heard. ?  No friction rub. No gallop.  ?Pulmonary:  ?   Effort: Pulmonary effort is normal. No respiratory distress.  ?   Breath sounds: Normal breath sounds. No stridor. No wheezing, rhonchi or rales.  ?Chest:  ?   Chest wall: No tenderness.  ?Neurological:  ?   General: No focal deficit present.  ?   Mental Status: He is alert and oriented to person,  place, and time. Mental status is at baseline.  ?Psychiatric:     ?   Mood and Affect: Mood normal.     ?   Behavior: Behavior normal.     ?   Thought Content: Thought content normal.     ?   Judgment: Judgment norm

## 2021-04-15 NOTE — Telephone Encounter (Signed)
Pt needs pro authorization on Continuous Blood Gluc Receiver (DEXCOM G6 RECEIVER) DEVI  and Continuous Blood Gluc Sensor (DEXCOM G6 SENSOR) MISC , Continuous Blood Gluc Transmit (DEXCOM G6 TRANSMITTER) MISC [[id number]]  ? ?Pt needs pro authorization in order to get filled  ?

## 2021-04-15 NOTE — Therapy (Signed)
[facility] ?[place] [address] ?[place], [place], [address] ?Phone: [phone]   Fax:  [phone] ? ?Occupational Therapy Treatment & Progress Note ? ?Patient Details  ?Name: Joe Stewart ?MRN: [id number] ?Date of Birth: [birth year] ?Referring Provider (OT): Bonnielee Haff, MD ? ? ?Encounter Date: 04/15/2021 ? ? OT End of Session - 04/15/21 0934   ? ? Visit Number 10   ? Number of Visits 17   ? Date for OT Re-Evaluation 05/27/21   ? Authorization Type [employer]   ? OT Start Time [phone]   ? OT Stop Time 1010   ? OT Time Calculation (min) 42 min   ? Activity Tolerance Patient tolerated treatment well   ? Behavior During Therapy [facility] for tasks assessed/performed   ? ?  ?  ? ?  ? ?Occupational Therapy Progress Note ? ?Dates of Reporting Period: 03/04/21 to 04/15/21 ? ?Objective Reports of Subjective Statement: Pt continues to monitor and record high BP and blood sugar levels ? ?Objective Measurements: symbol cancellation accuracy, 9-Hole Peg Test, levels of independence, Trail Making Test: Part B ? ?Goal Update: Pt has met 2/2 STGs and 2/5 LTGs; working toward remaining LTGs at this time ? ?Plan: Continue to address [facility] and executive functioning during functional activities, provide condition-specific education w/ related compensatory strategies/AE prn, and facilitate improved carryover of learned strategies to home in order to improve participation in all ADLs and overall level of independence. ? ?Reason Skilled Services are Required: Pt continues to demonstrate decreased [place], altered sensation, and significant difficulty w/ higher level cognitive tasks and executive functioning s/p cerebral infarct 2 months ago. ? ?Past Medical History:  ?Diagnosis Date  ? Arthritis   ? Cataract   ? Mixed OU  ? Diabetes mellitus without complication ([place])   ? Diabetic retinopathy ([place])   ? NPDR OU  ? Hyperlipidemia   ? Hypertension   ? Hypertensive retinopathy   ? OU  ? Obesity    ? ? ?Past Surgical History:  ?Procedure Laterality Date  ? BIOPSY  11/29/2019  ? Procedure: BIOPSY;  Surgeon: Irving Copas., MD;  Location: [facility];  Service: Gastroenterology;;  ? COLONOSCOPY WITH PROPOFOL N/A 11/29/2019  ? Procedure: COLONOSCOPY WITH PROPOFOL;  Surgeon: Mansouraty, Telford Nab., MD;  Location: [facility];  Service: Gastroenterology;  Laterality: N/A;  ? ENDOSCOPIC MUCOSAL RESECTION N/A 11/29/2019  ? Procedure: ENDOSCOPIC MUCOSAL RESECTION;  Surgeon: Rush Landmark Telford Nab., MD;  Location: [facility];  Service: Gastroenterology;  Laterality: N/A;  ? ESOPHAGOGASTRODUODENOSCOPY (EGD) WITH PROPOFOL N/A 11/29/2019  ? Procedure: ESOPHAGOGASTRODUODENOSCOPY (EGD) WITH PROPOFOL;  Surgeon: Rush Landmark Telford Nab., MD;  Location: [facility];  Service: Gastroenterology;  Laterality: N/A;  ? EYE SURGERY Left   ? had to have eye flushed after getting costic sodium in L eye  ? HEMOSTASIS CLIP PLACEMENT  11/29/2019  ? Procedure: HEMOSTASIS CLIP PLACEMENT;  Surgeon: Irving Copas., MD;  Location: [facility];  Service: Gastroenterology;;  ? HERNIA REPAIR    ? HERNIA REPAIR    ? 35 years ago  ? HOT HEMOSTASIS N/A 11/29/2019  ? Procedure: HOT HEMOSTASIS (ARGON PLASMA COAGULATION/BICAP);  Surgeon: Irving Copas., MD;  Location: [facility];  Service: Gastroenterology;  Laterality: N/A;  ? LEFT HEART CATH AND CORONARY ANGIOGRAPHY N/A 04/11/2019  ? Procedure: LEFT HEART CATH AND CORONARY ANGIOGRAPHY;  Surgeon: Adrian Prows, MD;  Location: [facility];  Service: Cardiovascular;  Laterality: N/A;  ? POLYPECTOMY  11/29/2019  ? Procedure: POLYPECTOMY;  Surgeon: Irving Copas., MD;  Location: [facility];  Service: Gastroenterology;;  ? RENAL ANGIOGRAPHY Bilateral 04/11/2019  ? Procedure: RENAL ANGIOGRAPHY;  Surgeon: Adrian Prows, MD;  Location: [facility];  Service: Cardiovascular;  Laterality: Bilateral;  ? SUBMUCOSAL LIFTING INJECTION  11/29/2019  ? Procedure:  SUBMUCOSAL LIFTING INJECTION;  Surgeon: Irving Copas., MD;  Location: [facility];  Service: Gastroenterology;;  ? TOTAL KNEE ARTHROPLASTY Left 08/01/2019  ? Procedure: LEFT TOTAL KNEE ARTHROPLASTY;  Surgeon: Meredith Pel, MD;  Location: [facility];  Service: Orthopedics;  Laterality: Left;  ? ? ?There were no vitals filed for this visit. ? ? Subjective Assessment - 04/15/21 0932   ? ? Subjective  "I'm tired today"   ? Patient is accompanied by: Family member   [name] (brother)  ? 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 Limitations Visual deficits; would benefit from large print for pt instructions/handouts   ? Patient Stated Goals Get back to living alone   ? Currently in Pain? No/denies   ? ?  ?  ? ?  ? ? OT Treatment - 04/15/21  ? ? IADLs: Health Maintenance Reviewed purpose and benefit of independently maintaining log of recorded date/time of food/drink, medications, and both blood sugar and BP readings ?  ? IADLs: Paramedic navigation activity involving pt reading 1-2 step written directions and identifying correct/most efficient route via printed map. Pt able to complete 6/6 trials w/ Mod I, requiring increased time for visual scanning; demonstrated good recall during activity w/ occasional verbal cues for recall of specifics (e.g., identifying "fastest" route)  ? ?  ?  ? ?  ? ? OT Short Term Goals - 03/14/21 1004   ? ?  ? OT SHORT TERM GOAL #1  ? Title Pt will verbalize understanding of visual compensatory strategies to incorporate during tabletop/near visual acuity activities   ? Baseline No visual compensatory strategies   ? Time 2   ? Period Weeks   ? Status Achieved   03/11/21  ? Target Date 03/18/21   ?  ? OT SHORT TERM GOAL #2  ? Title  Pt will be able to complete symbol cancellation task, using AE/compensatory strategies prn w/ 100% accuracy   ? Baseline Completed 50% of page w/ 14/15 correct   ? Time 4   ? Period Weeks   ? Status Achieved   03/14/21 - 24/24 using line guide  ? Target Date 04/01/21   ? ?  ?  ? ?  ? ? OT Long Term Goals - 04/15/21 0954   ? ?  ? OT LONG TERM GOAL #1  ? Title Pt will demonstrate improved participation in functional FM tasks as evidenced by decreasing 9-HPT time by at least 5 seconds w/ R, dominant hand   ? Baseline 44 sec (L hand 36 sec)   ? Time 8   ? Period Weeks   ? Status Achieved   03/18/21 - 35 sec  ? Target Date 04/29/21   ?  ? OT LONG TERM GOAL #2  ? Title Pt will be able to sort medications (simulated or actual) correctly, using AE/compensatory strategies prn   ? Baseline Not currently managing medication   ? Time 8   ? Period Weeks   ?  Status On-going   ? Target Date 04/29/21   ?  ? OT LONG TERM GOAL #3  ? Title Pt will be able to complete simulated meal prep activity w/ Mod I   ? Baseline Not currently participating in meal prep   ? Time 8   ? Period Weeks   ? Status Achieved   04/03/21 - completing independently at home, per pt report  ? Target Date 04/29/21   ?  ? OT LONG TERM GOAL #4  ? Title Pt will demonstrate independence w/ compensatory strategies, including AE prn, to improve handwriting legibility and efficiency   ? Baseline Fair legibility; tremor observed   ? Time 8   ? Period Weeks   ? Status On-going   ? Target Date 04/29/21   ?  ? OT LONG TERM GOAL #5  ? Title Pt will be able to complete TMT: Part B in less than 1 min, 30 sec to indicate potential appropriateness for initiating return to driving   ? Baseline Over 1 min, 30 sec   ? Time 8   ? Period Weeks   ? Status On-going   04/15/21 - 2 min, 18 sec  ? Target Date 04/29/21   ? ?  ?  ? ?  ? ? Plan - 04/15/21 1003   ? ? Clinical Impression Statement OT reviewed progress toward goals w/ pt, reassessing TMT: Part B and discussing interpretation of  results. Pt continues to demonstrate significant difficulty w/ memory and higher level cognition/executive functioning. He was unable to recall what was discussed in previous session, including OT recommen

## 2021-04-15 NOTE — Therapy (Signed)
[facility] ?[place] ?[address] ?[place], [place], [address] ?Phone: [phone]   Fax:  [phone] ? ?Physical Therapy Treatment ? ?Patient Details  ?Name: Joe Stewart ?MRN: [id number] ?Date of Birth: [birth year] ?No data recorded ? ?Encounter Date: 04/15/2021 ? ? PT End of Session - 04/15/21 0928   ? ? Visit Number 9   ? Date for PT Re-Evaluation 06/01/21   ? Authorization Type [employer]   ? PT Start Time 0845   ? PT Stop Time 0929   ? PT Time Calculation (min) 44 min   ? Activity Tolerance Patient tolerated treatment well   ? Behavior During Therapy [facility] for tasks assessed/performed   ? ?  ?  ? ?  ? ? ?Past Medical History:  ?Diagnosis Date  ? Arthritis   ? Cataract   ? Mixed OU  ? Diabetes mellitus without complication ([place])   ? Diabetic retinopathy ([place])   ? NPDR OU  ? Hyperlipidemia   ? Hypertension   ? Hypertensive retinopathy   ? OU  ? Obesity   ? ? ?Past Surgical History:  ?Procedure Laterality Date  ? BIOPSY  11/29/2019  ? Procedure: BIOPSY;  Surgeon: Irving Copas., MD;  Location: [facility];  Service: Gastroenterology;;  ? COLONOSCOPY WITH PROPOFOL N/A 11/29/2019  ? Procedure: COLONOSCOPY WITH PROPOFOL;  Surgeon: Mansouraty, Telford Nab., MD;  Location: [facility];  Service: Gastroenterology;  Laterality: N/A;  ? ENDOSCOPIC MUCOSAL RESECTION N/A 11/29/2019  ? Procedure: ENDOSCOPIC MUCOSAL RESECTION;  Surgeon: Rush Landmark Telford Nab., MD;  Location: [facility];  Service: Gastroenterology;  Laterality: N/A;  ? ESOPHAGOGASTRODUODENOSCOPY (EGD) WITH PROPOFOL N/A 11/29/2019  ? Procedure: ESOPHAGOGASTRODUODENOSCOPY (EGD) WITH PROPOFOL;  Surgeon: Rush Landmark Telford Nab., MD;  Location: [facility];  Service: Gastroenterology;  Laterality: N/A;  ? EYE SURGERY Left   ? had to have eye flushed after getting costic sodium in L eye  ? HEMOSTASIS CLIP PLACEMENT  11/29/2019  ? Procedure: HEMOSTASIS CLIP PLACEMENT;  Surgeon: Irving Copas., MD;  Location:  [facility];  Service: Gastroenterology;;  ? HERNIA REPAIR    ? HERNIA REPAIR    ? 35 years ago  ? HOT HEMOSTASIS N/A 11/29/2019  ? Procedure: HOT HEMOSTASIS (ARGON PLASMA COAGULATION/BICAP);  Surgeon: Irving Copas., MD;  [facility];  Service: Gastroenterology;  Laterality: N/A;  ? LEFT HEART CATH AND CORONARY ANGIOGRAPHY N/A 04/11/2019  ? Procedure: LEFT HEART CATH AND CORONARY ANGIOGRAPHY;  Surgeon: Adrian Prows, MD;  Location: [facility];  Service: Cardiovascular;  Laterality: N/A;  ? POLYPECTOMY  11/29/2019  ? Procedure: POLYPECTOMY;  Surgeon: Irving Copas., MD;  Location: [facility];  Service: Gastroenterology;;  ? RENAL ANGIOGRAPHY Bilateral 04/11/2019  ? Procedure: RENAL ANGIOGRAPHY;  Surgeon: Adrian Prows, MD;  Location: [facility];  Service: Cardiovascular;  Laterality: Bilateral;  ? SUBMUCOSAL LIFTING INJECTION  11/29/2019  ? Procedure: SUBMUCOSAL LIFTING INJECTION;  Surgeon: Irving Copas., MD;  Location: [facility];  Service: Gastroenterology;;  ? TOTAL KNEE ARTHROPLASTY Left 08/01/2019  ? Procedure: LEFT TOTAL KNEE ARTHROPLASTY;  Surgeon: Meredith Pel, MD;  Location: [facility];  Service: Orthopedics;  Laterality: Left;  ? ? ?There were no vitals filed for this visit. ? ? Subjective Assessment - 04/15/21 0853   ? ? Subjective Patient reports that on Friday he did not eat and got into trouble where he had low blood sugar and felt faint, went down on one knee, got up and ate and felt   ?  Currently in Pain? No/denies   ? ?  ?  ? ?  ? ? ? ? ? ? ? ? ? ? ? ? ? ? ? ? ? ? ? ? [facility] [facility] Adult PT Treatment/Exercise - 04/15/21 0001   ? ?  ? Ambulation/Gait  ? Gait Comments fast walking 2 laps   ?  ? High Level Balance  ? High Level Balance Comments on airex ball toss, side stepping on and off airex, on airex head turns and eyes closed, walking ball toss, side stepping ball toss and backward walking ball toss, airex balance beam side stepping, tandem walk, on airex  cone toe touches, on airex cone touches with hands, standing on bosu in the P bars   ?  ? Knee/Hip Exercises: Aerobic  ? Nustep L 5 x 6 minutes.   ? ?  ?  ? ?  ? ? ? ? ? ? ? ? ? ? ? ? PT Short Term Goals - 04/01/21 1145   ? ?  ? PT SHORT TERM GOAL #1  ? Title independent with initial HEP   ? Baseline Patient encouraged to perofrm HEP.   ? Time 1   ? Period Weeks   ? Status Achieved   ? ?  ?  ? ?  ? ? ? ? PT Long Term Goals - 04/15/21 0932   ? ?  ? PT LONG TERM GOAL #1  ? Title Pt will be I and compliant with HEP.   ? Status On-going   ?  ? PT LONG TERM GOAL #2  ? Title Pt will improve right hip/knee strength to 5/5 MMT tested in sitting to improve overall funciton   ? Status Partially Met   ?  ? PT LONG TERM GOAL #3  ? Title improve berg balance score to 47/56   ? Status On-going   ?  ? PT LONG TERM GOAL #4  ? Title increase DGI score to 19   ? Status On-going   ? ?  ?  ? ?  ? ? ? ? ? ? ? ? Plan - 04/15/21 0929   ? ? Clinical Impression Statement Patinet had a n issue over the weekend iwth low blood sugar, did not pass out or fall just went down to a knee, once he ate, he was better, todays treatment focused on high level balance, he did well, had issues with the dynamic surface standing and a lot of difficulty with eyes closed and head motions.  Did some fast walking where he had to keep up with me and he did well, th big issue I see is without this he will take small slow steps and seems unsure of himself   ? PT Next Visit Plan continue to work on gait, speed and balance   ? Consulted and Agree with Plan of Care Patient   ? ?  ?  ? ?  ? ? ?Patient will benefit from skilled therapeutic intervention in order to improve the following deficits and impairments:  Abnormal gait, Decreased coordination, Decreased range of motion, Difficulty walking, Dizziness, Decreased activity tolerance, Decreased balance, Decreased strength, Decreased mobility ? ?Visit Diagnosis: ?Hemiplegia and hemiparesis following cerebral  infarction affecting right dominant side ([place]) ? ?Unsteadiness on feet ? ?Difficulty in walking, not elsewhere classified ? ?Other abnormalities of gait and mobility ? ?Muscle weakness (generalized) ? ? ? ? ?Problem List ?Patient Active Problem List  ? Diagnosis Date Noted  ? AKI (acute kidney injury) ([place]) 02/24/2021  ?  Acute CVA (cerebrovascular accident) ([place]) 02/19/2021  ? Hyperlipidemia 02/19/2021  ? Anemia 10/12/2019  ? Cecal polyp 09/30/2019  ? History of colon polyps 09/30/2019  ? Abnormal colonoscopy 09/30/2019  ? Arthritis of left knee 08/01/2019  ? Type 2 diabetes mellitus without complication, without long-term current use of insulin ([place]) 05/01/2019  ? Dyspnea on exertion 04/10/2019  ? Abnormal nuclear stress test 04/10/2019  ? Essential hypertension 02/27/2019  ? Acute pain of left knee 02/27/2019  ? ? Sumner Boast, PT ?04/15/2021, 9:33 AM ? ?[facility] [place] ?[place] ?[address] ?[place], [place], [address] ?Phone: [phone]   Fax:  [phone] ? ?Name: Joe Stewart ?MRN: [id number] ?Date of Birth: Jan 13, 1956 ? ? ? ?

## 2021-04-16 NOTE — Therapy (Signed)
[facility] ?[place], [place], [address] ?Phone: [phone]   Fax:  [phone] ? ?Speech Language Pathology Treatment ? ?Patient Details  ?Name: Joe Stewart ?MRN: [id number] ?Date of Birth: October 22, 1955 ?Referring Provider (SLP): Bonnielee Haff, MD ? ? ?Encounter Date: 04/15/2021 ? ? End of Session - 04/15/21 0814   ? ? Visit Number 10   ? Number of Visits 17   ? Date for SLP Re-Evaluation 06/01/21   ? SLP Start Time [phone]   ? SLP Stop Time  0844   ? SLP Time Calculation (min) 36 min   ? Activity Tolerance Patient tolerated treatment well   ? ?  ?  ? ?  ? ? ?Past Medical History:  ?Diagnosis Date  ? Arthritis   ? Cataract   ? Mixed OU  ? Diabetes mellitus without complication ([place])   ? Diabetic retinopathy ([place])   ? NPDR OU  ? Hyperlipidemia   ? Hypertension   ? Hypertensive retinopathy   ? OU  ? Obesity   ? ? ?Past Surgical History:  ?Procedure Laterality Date  ? BIOPSY  11/29/2019  ? Procedure: BIOPSY;  Surgeon: Irving Copas., MD;  Location: [facility];  Service: Gastroenterology;;  ? COLONOSCOPY WITH PROPOFOL N/A 11/29/2019  ? Procedure: COLONOSCOPY WITH PROPOFOL;  Surgeon: Mansouraty, Telford Nab., MD;  Location: [facility];  Service: Gastroenterology;  Laterality: N/A;  ? ENDOSCOPIC MUCOSAL RESECTION N/A 11/29/2019  ? Procedure: ENDOSCOPIC MUCOSAL RESECTION;  Surgeon: Rush Landmark Telford Nab., MD;  Location: [facility];  Service: Gastroenterology;  Laterality: N/A;  ? ESOPHAGOGASTRODUODENOSCOPY (EGD) WITH PROPOFOL N/A 11/29/2019  ? Procedure: ESOPHAGOGASTRODUODENOSCOPY (EGD) WITH PROPOFOL;  Surgeon: Rush Landmark Telford Nab., MD;  Location: [facility];  Service: Gastroenterology;  Laterality: N/A;  ? EYE SURGERY Left   ? had to have eye flushed after getting costic sodium in L eye  ? HEMOSTASIS CLIP PLACEMENT  11/29/2019  ? Procedure: HEMOSTASIS CLIP PLACEMENT;  Surgeon: Irving Copas., MD;  Location: [facility];  Service:  Gastroenterology;;  ? HERNIA REPAIR    ? HERNIA REPAIR    ? 35 years ago  ? HOT HEMOSTASIS N/A 11/29/2019  ? Procedure: HOT HEMOSTASIS (ARGON PLASMA COAGULATION/BICAP);  Surgeon: Irving Copas., MD;  Location: [facility];  Service: Gastroenterology;  Laterality: N/A;  ? LEFT HEART CATH AND CORONARY ANGIOGRAPHY N/A 04/11/2019  ? Procedure: LEFT HEART CATH AND CORONARY ANGIOGRAPHY;  Surgeon: Adrian Prows, MD;  Location: [facility];  Service: Cardiovascular;  Laterality: N/A;  ? POLYPECTOMY  11/29/2019  ? Procedure: POLYPECTOMY;  Surgeon: Irving Copas., MD;  Location: [facility];  Service: Gastroenterology;;  ? RENAL ANGIOGRAPHY Bilateral 04/11/2019  ? Procedure: RENAL ANGIOGRAPHY;  Surgeon: Adrian Prows, MD;  Location: [facility];  Service: Cardiovascular;  Laterality: Bilateral;  ? SUBMUCOSAL LIFTING INJECTION  11/29/2019  ? Procedure: SUBMUCOSAL LIFTING INJECTION;  Surgeon: Irving Copas., MD;  Location: [facility];  Service: Gastroenterology;;  ? TOTAL KNEE ARTHROPLASTY Left 08/01/2019  ? Procedure: LEFT TOTAL KNEE ARTHROPLASTY;  Surgeon: Meredith Pel, MD;  Location: [facility];  Service: Orthopedics;  Laterality: Left;  ? ? ?There were no vitals filed for this visit. ? ? Subjective Assessment - 04/15/21 0812   ? ? Subjective "I feel disorganized this morning."   ? Currently in Pain? No/denies   ? ?  ?  ? ?  ? ? ? ? ? ? ? ? ADULT SLP TREATMENT - 04/16/21 1453   ? ?  ?  General Information  ? Behavior/Cognition Alert;Cooperative   ?  ? Treatment Provided  ? Treatment provided Cognitive-Linquistic   ?  ? Cognitive-Linquistic Treatment  ? Treatment focused on Cognition   ? Skilled Treatment Pt reported he has not been finding time to review strategies at home. SLP encouraged pt that review multiple times will increase his memory of strategies; therefore, increasing use. SLP referred back to patient schedule. SLP assisted pt with setting alarms as reminders to complete PT, OT,  and ST exercises/review of strategies. SLP trained pt use of internal memory strategy (repetition) using rules for card game. With repetition, pt was able to recall rules of game independently at the end of session. SLP also encouraged pt to use association to recall therapist name (i.e. Phylis Bougie). Pt was able to recall at the end of session (given extra time).   ?  ? Assessment / Recommendations / Plan  ? Plan Continue with current plan of care   ?  ? Progression Toward Goals  ? Progression toward goals Progressing toward goals   ? ?  ?  ? ?  ? ? ? ? ? SLP Short Term Goals - 04/01/21 1536   ? ?  ? SLP SHORT TERM GOAL #1  ? Title Pt will increase auditory comprehension by recalling and verbalizing strategies to assist with active listening during conversation with minA.   ? Time 4   ? Period Weeks   ? Status Achieved   ? Target Date 04/01/21   ?  ? SLP SHORT TERM GOAL #2  ? Title Pt will comprehend functional memory or visual aids for recall of important information during structured conversations with minA.   ? Time 4   ? Period Weeks   ? Status On-going   Not met  ? Target Date 04/01/21   ?  ? SLP SHORT TERM GOAL #3  ? Title Pt will complete BNT naming test to identify anomia.   ? Time 3   ? Period Weeks   ? Status On-going   Not met  ? Target Date 03/25/21   ? ?  ?  ? ?  ? ? ? SLP Long Term Goals - 03/04/21 1249   ? ?  ? SLP LONG TERM GOAL #1  ? Title Pt will increase auditory comprehension by recalling and verbalizing strategies to assist with active listening during  structured conversation independently.   ? Time 8   ? Period Weeks   ? Status New   ? Target Date 04/29/21   ?  ? SLP LONG TERM GOAL #2  ? Title Pt will comprehend functional memory or visual aids for recall of important information during structured conversation independently   ? Time 8   ? Period Weeks   ? Status New   ? Target Date 04/29/21   ? ?  ?  ? ?  ? ? ? Plan - 04/16/21 1453   ? ? Clinical Impression Statement See tx note.  Cont  with current POC.   ? Speech Therapy Frequency 2x / week   ? Duration 8 weeks   ? Treatment/Interventions Language facilitation;Environmental controls;Cueing hierarchy;SLP instruction and feedback;Cognitive reorganization;Functional tasks;Compensatory strategies;Internal/external aids;Multimodal communcation approach;Patient/family education   ? Potential to Achieve Goals Good   ? Consulted and Agree with Plan of Care Patient;Family member/caregiver   ? Family Member Consulted Brother   ? ?  ?  ? ?  ? ? ?Patient will benefit from skilled therapeutic intervention in order to improve  the following deficits and impairments:   ?Cognitive communication deficit ? ? ? ?Problem List ?Patient Active Problem List  ? Diagnosis Date Noted  ? AKI (acute kidney injury) ([facility]) 02/24/2021  ? Acute CVA (cerebrovascular accident) ([facility]) 02/19/2021  ? Hyperlipidemia 02/19/2021  ? Anemia 10/12/2019  ? Cecal polyp 09/30/2019  ? History of colon polyps 09/30/2019  ? Abnormal colonoscopy 09/30/2019  ? Arthritis of left knee 08/01/2019  ? Type 2 diabetes mellitus without complication, without long-term current use of insulin ([facility]) 05/01/2019  ? Dyspnea on exertion 04/10/2019  ? Abnormal nuclear stress test 04/10/2019  ? Essential hypertension 02/27/2019  ? Acute pain of left knee 02/27/2019  ? ?Speech Therapy Progress Note ? ?Dates of Reporting Period: 03/04/21 to present ? ?Subjective Statement: Pt reports not using strategies at home.  ? ?Objective: See previous tx notes. ? ?Goal Update: Making progress towards goals; however, SLP suspects minimal progress due to lack of follow through at home. ? ?Plan: Cont with current POC.  ? ?Reason Skilled Services are Required: SLP rec continued speech therapy services due to the negative impact cognitive-communication impairment is having on his social/daily participation.  ? ?Verdene Lennert, [place] ?04/16/2021, 2:56 PM ? ?[facility] ?[address] ?[place], [place], [address] ?Phone: [phone]   Fax:  [phone] ? ? ?Name: Joe Stewart ?MRN: [id number] ?Date of Birth: May 20, 1955 ? ?

## 2021-04-16 NOTE — Telephone Encounter (Signed)
I will look on cover my meds to see if this patient needs a PA ?

## 2021-04-17 ENCOUNTER — Encounter: Payer: Self-pay | Admitting: Physical Therapy

## 2021-04-17 ENCOUNTER — Other Ambulatory Visit: Payer: Self-pay

## 2021-04-17 ENCOUNTER — Encounter: Payer: Self-pay | Admitting: Occupational Therapy

## 2021-04-17 ENCOUNTER — Ambulatory Visit: Payer: Medicare Other | Admitting: Occupational Therapy

## 2021-04-17 ENCOUNTER — Encounter: Payer: Medicare Other | Admitting: Speech Pathology

## 2021-04-17 ENCOUNTER — Ambulatory Visit: Payer: Medicare Other | Admitting: Physical Therapy

## 2021-04-17 DIAGNOSIS — R2681 Unsteadiness on feet: Secondary | ICD-10-CM | POA: Diagnosis not present

## 2021-04-17 DIAGNOSIS — R29818 Other symptoms and signs involving the nervous system: Secondary | ICD-10-CM | POA: Diagnosis not present

## 2021-04-17 DIAGNOSIS — R41840 Attention and concentration deficit: Secondary | ICD-10-CM

## 2021-04-17 DIAGNOSIS — R41842 Visuospatial deficit: Secondary | ICD-10-CM

## 2021-04-17 DIAGNOSIS — M25662 Stiffness of left knee, not elsewhere classified: Secondary | ICD-10-CM | POA: Diagnosis not present

## 2021-04-17 DIAGNOSIS — R262 Difficulty in walking, not elsewhere classified: Secondary | ICD-10-CM | POA: Diagnosis not present

## 2021-04-17 DIAGNOSIS — R278 Other lack of coordination: Secondary | ICD-10-CM | POA: Diagnosis not present

## 2021-04-17 DIAGNOSIS — I69351 Hemiplegia and hemiparesis following cerebral infarction affecting right dominant side: Secondary | ICD-10-CM

## 2021-04-17 DIAGNOSIS — R2689 Other abnormalities of gait and mobility: Secondary | ICD-10-CM | POA: Diagnosis not present

## 2021-04-17 DIAGNOSIS — M6281 Muscle weakness (generalized): Secondary | ICD-10-CM | POA: Diagnosis not present

## 2021-04-17 NOTE — Therapy (Signed)
[facility] [address] ?[place], [place], [address] ?Phone: [phone]   Fax:  [phone] ? ?Physical Therapy Treatment ?Progress Note ?Reporting Period 1/31/223 to 04/17/21 ? ? ?See note below for Objective Data and Assessment of Progress/Goals.  ? ?  ?Patient Details  ?Name: Joe Stewart ?MRN: [id number] ?Date of Birth: 01-23-1956 ?No data recorded ? ?Encounter Date: 04/17/2021 ? ? PT End of Session - 04/17/21 0926   ? ? Visit Number 10   ? Date for PT Re-Evaluation 06/01/21   ? Authorization Type [employer] MC   ? PT Start Time [id number]   ? PT Stop Time 3536   ? PT Time Calculation (min) 38 min   ? Activity Tolerance Patient tolerated treatment well   ? Behavior During Therapy [facility] for tasks assessed/performed   ? ?  ?  ? ?  ? ? ?Past Medical History:  ?Diagnosis Date  ? Arthritis   ? Cataract   ? Mixed OU  ? Diabetes mellitus without complication ([place])   ? Diabetic retinopathy ([place])   ? NPDR OU  ? Hyperlipidemia   ? Hypertension   ? Hypertensive retinopathy   ? OU  ? Obesity   ? ? ?Past Surgical History:  ?Procedure Laterality Date  ? BIOPSY  11/29/2019  ? Procedure: BIOPSY;  Surgeon: Irving Copas., MD;  Location: [facility];  Service: Gastroenterology;;  ? COLONOSCOPY WITH PROPOFOL N/A 11/29/2019  ? Procedure: COLONOSCOPY WITH PROPOFOL;  Surgeon: Mansouraty, Telford Nab., MD;  Location: [facility];  Service: Gastroenterology;  Laterality: N/A;  ? ENDOSCOPIC MUCOSAL RESECTION N/A 11/29/2019  ? Procedure: ENDOSCOPIC MUCOSAL RESECTION;  Surgeon: Rush Landmark Telford Nab., MD;  Location: [facility];  Service: Gastroenterology;  Laterality: N/A;  ? ESOPHAGOGASTRODUODENOSCOPY (EGD) WITH PROPOFOL N/A 11/29/2019  ? Procedure: ESOPHAGOGASTRODUODENOSCOPY (EGD) WITH PROPOFOL;  Surgeon: Rush Landmark Telford Nab., MD;  Location: [facility];  Service: Gastroenterology;  Laterality: N/A;  ? EYE SURGERY Left   ? had to have eye flushed after getting costic sodium in L eye   ? HEMOSTASIS CLIP PLACEMENT  11/29/2019  ? Procedure: HEMOSTASIS CLIP PLACEMENT;  Surgeon: Irving Copas., MD;  Location: [facility];  Service: Gastroenterology;;  ? HERNIA REPAIR    ? HERNIA REPAIR    ? 35 years ago  ? HOT HEMOSTASIS N/A 11/29/2019  ? Procedure: HOT HEMOSTASIS (ARGON PLASMA COAGULATION/BICAP);  Surgeon: Irving Copas., MD;  Location: [facility];  Service: Gastroenterology;  Laterality: N/A;  ? LEFT HEART CATH AND CORONARY ANGIOGRAPHY N/A 04/11/2019  ? Procedure: LEFT HEART CATH AND CORONARY ANGIOGRAPHY;  Surgeon: Adrian Prows, MD;  Location: [facility];  Service: Cardiovascular;  Laterality: N/A;  ? POLYPECTOMY  11/29/2019  ? Procedure: POLYPECTOMY;  Surgeon: Irving Copas., MD;  Location: [facility];  Service: Gastroenterology;;  ? RENAL ANGIOGRAPHY Bilateral 04/11/2019  ? Procedure: RENAL ANGIOGRAPHY;  Surgeon: Adrian Prows, MD;  Location: [facility];  Service: Cardiovascular;  Laterality: Bilateral;  ? SUBMUCOSAL LIFTING INJECTION  11/29/2019  ? Procedure: SUBMUCOSAL LIFTING INJECTION;  Surgeon: Irving Copas., MD;  Location: [facility];  Service: Gastroenterology;;  ? TOTAL KNEE ARTHROPLASTY Left 08/01/2019  ? Procedure: LEFT TOTAL KNEE ARTHROPLASTY;  Surgeon: Meredith Pel, MD;  Location: [facility];  Service: Orthopedics;  Laterality: Left;  ? ? ?There were no vitals filed for this visit. ? ? Subjective Assessment - 04/17/21 0853   ? ? Subjective Patient reports an episode of tunnel vision, dizziness on last visit.  Dr has adjusted BP meds and no episodes ever since.   ? Currently in Pain? No/denies   ? ?  ?  ? ?  ? ? ? ? ? [facility] PT Assessment - 04/17/21 0001   ? ?  ? Strength  ? Right Hip Flexion 4/5   ? Right Hip Extension 4+/5   ? Right Hip ABduction 4/5   ? Right Knee Flexion 4+/5   ? Right Knee Extension 4+/5   ? Right Ankle Dorsiflexion 4/5   ? Right Ankle Plantar Flexion 4-/5   ? Right Ankle Inversion 4/5   ? Right Ankle Eversion 4/5    ?  ? Berg Balance Test  ? Sit to Stand Able to stand without using hands and stabilize independently   ? Standing Unsupported Able to stand safely 2 minutes   ? Sitting with Back Unsupported but Feet Supported on Floor or Stool Able to sit safely and securely 2 minutes   ? Stand to Sit Sits safely with minimal use of hands   ? Transfers Able to transfer safely, definite need of hands   ? Standing Unsupported with Eyes Closed Able to stand 10 seconds with supervision   ? Standing Unsupported with Feet Together Able to place feet together independently and stand for 1 minute with supervision   ? From Standing, Reach Forward with Outstretched Arm Can reach forward >12 cm safely (5")   ? From Standing Position, Pick up Object from [place] to pick up shoe safely and easily   ? From Standing Position, Turn to Look Behind Over each Shoulder Looks behind from both sides and weight shifts well   ? Turn 360 Degrees Able to turn 360 degrees safely in 4 seconds or less   ? Standing Unsupported, Alternately Place Feet on Step/Stool Able to stand independently and safely and complete 8 steps in 20 seconds   ? Standing Unsupported, One Foot in Front Able to plae foot ahead of the other independently and hold 30 seconds   ? Standing on One Leg Able to lift leg independently and hold 5-10 seconds   ? Total Score 50   ?  ? Dynamic Gait Index  ? Level Surface Normal   ? Change in Gait Speed Mild Impairment   ? Gait with Horizontal Head Turns Normal   ? Gait with Vertical Head Turns Normal   ? Gait and Pivot Turn Normal   ? Step Over Obstacle Mild Impairment   ? Step Around Obstacles Mild Impairment   ? Steps Mild Impairment   ? Total Score 20   ? ?  ?  ? ?  ? ? ? ? ? ? ? ? ? ? ? ? ? ? ? ? [facility] Adult PT Treatment/Exercise - 04/17/21 0001   ? ?  ? Knee/Hip Exercises: Aerobic  ? Nustep L 5 x 6 minutes.   ?  ? Knee/Hip Exercises: Standing  ? Forward Step Up Both;1 set;10 reps;Hand Hold: 1;Step Height: 8"   ? Forward Step Up  Limitations 6" step with Airex on top.   ? ?  ?  ? ?  ? ? ? ? ? ? ? ? ? ? ? ? PT Short Term Goals - 04/01/21 1145   ? ?  ? PT SHORT TERM GOAL #1  ? Title independent with initial HEP   ? Baseline Patient encouraged to perofrm HEP.   ? Time 1   ? Period Weeks   ? Status Achieved   ? ?  ?  ? ?  ? ? ? ?  PT Long Term Goals - 04/17/21 0924   ? ?  ? PT LONG TERM GOAL #1  ? Title Pt will be I and compliant with HEP.   ? Status On-going   ?  ? PT LONG TERM GOAL #2  ? Title Pt will improve right hip/knee strength to 5/5 MMT tested in sitting to improve overall funciton   ? Baseline 4/5, decresed coordinated control functionally.   ? Time 8   ? Period Weeks   ? Status On-going   ?  ? PT LONG TERM GOAL #3  ? Title improve berg balance score to 47/56   ? Baseline 50   ? Period Weeks   ? Status Achieved   ?  ? PT LONG TERM GOAL #4  ? Title increase DGI score to 19   ? Baseline 20   ? Period Weeks   ? Status Achieved   ? ?  ?  ? ?  ? ? ? ? ? ? ? ? Plan - 04/17/21 0927   ? ? Clinical Impression Statement Patient reports episode of Low Blood Pressure in this facility on last visit, coming out of ST. Dr has adjusted his BP meds. Functional status re-assessed for PN. He demonstrates progress in all areas, meeting goals for BERG and DGI. However, he still scores in a fall risk category, demosntrates R hip wekness, decreased lateral stability, functional strength, balance. he is benefitting from PT to address these issues and speed of movement.   ? Stability/Clinical Decision Making Evolving/Moderate complexity   ? Clinical Decision Making Low   ? Rehab Potential Good   ? PT Frequency 2x / week   ? PT Duration Other (comment)   7w  ? PT Treatment/Interventions ADLs/Self Care Home Management;Gait training;Neuromuscular re-education;Balance training;Therapeutic exercise;Therapeutic activities;Functional mobility training;Stair training;Patient/family education;Manual techniques   ? PT Next Visit Plan continue to work on gait, speed and  balance, strength   ? PT Home Exercise Plan [id number],  [id number]   ? Consulted and Agree with Plan of Care Patient   ? ?  ?  ? ?  ? ? ?Patient will benefit from skilled therapeutic intervention in order to improve

## 2021-04-18 NOTE — Therapy (Signed)
[facility] ?[place] ?[address] ?[place], [place], [address] ?Phone: [phone]   Fax:  [phone] ? ?Occupational Therapy Treatment ? ?Patient Details  ?Name: Joe Stewart ?MRN: [id number] ?Date of Birth: Oct 16, 1955 ?Referring Provider (OT): Bonnielee Haff, MD ? ? ?Encounter Date: 04/17/2021 ? ?  ?  OT End of Session - 04/17/21 0816   ?  ?  Visit Number 11  ?  Number of Visits 17   ?  Date for OT Re-Evaluation 05/27/21   ?  Authorization Type [employer]   ?  OT Start Time [phone]   pt arrival time  ?  OT Stop Time 0845  ?  OT Time Calculation (min) 35 min  ?  Activity Tolerance Patient tolerated treatment well   ?  Behavior During Therapy [facility] for tasks assessed/performed   ?  ?   ?  ?  ?   ?  ?Past Medical History:  ?Diagnosis Date  ? Arthritis   ? Cataract   ? Mixed OU  ? Diabetes mellitus without complication ([place])   ? Diabetic retinopathy ([place])   ? NPDR OU  ? Hyperlipidemia   ? Hypertension   ? Hypertensive retinopathy   ? OU  ? Obesity   ? ? ?Past Surgical History:  ?Procedure Laterality Date  ? BIOPSY  11/29/2019  ? Procedure: BIOPSY;  Surgeon: Irving Copas., MD;  Location[facility];  Service: Gastroenterology;;  ? COLONOSCOPY WITH PROPOFOL N/A 11/29/2019  ? Procedure: COLONOSCOPY WITH PROPOFOL;  Surgeon: Mansouraty, Telford Nab., MD;  Location: [facility];  Service: Gastroenterology;  Laterality: N/A;  ? ENDOSCOPIC MUCOSAL RESECTION N/A 11/29/2019  ? Procedure: ENDOSCOPIC MUCOSAL RESECTION;  Surgeon: Rush Landmark Telford Nab., MD;  Location: [facility];  Service: Gastroenterology;  Laterality: N/A;  ? ESOPHAGOGASTRODUODENOSCOPY (EGD) WITH PROPOFOL N/A 11/29/2019  ? Procedure: ESOPHAGOGASTRODUODENOSCOPY (EGD) WITH PROPOFOL;  Surgeon: Rush Landmark Telford Nab., MD;  Location: [facility];  Service: Gastroenterology;  Laterality: N/A;  ? EYE SURGERY Left   ? had to have eye flushed after getting costic sodium in L eye  ? HEMOSTASIS CLIP  PLACEMENT  11/29/2019  ? Procedure: HEMOSTASIS CLIP PLACEMENT;  Surgeon: Irving Copas., MD;  Location: [facility];  Service: Gastroenterology;;  ? HERNIA REPAIR    ? HERNIA REPAIR    ? 35 years ago  ? HOT HEMOSTASIS N/A 11/29/2019  ? Procedure: HOT HEMOSTASIS (ARGON PLASMA COAGULATION/BICAP);  Surgeon: Irving Copas., MD;  Location: [facility];  Service: Gastroenterology;  Laterality: N/A;  ? LEFT HEART CATH AND CORONARY ANGIOGRAPHY N/A 04/11/2019  ? Procedure: LEFT HEART CATH AND CORONARY ANGIOGRAPHY;  Surgeon: Adrian Prows, MD;  Location: [place] [facility];  Service: Cardiovascular;  Laterality: N/A;  ? POLYPECTOMY  11/29/2019  ? Procedure: POLYPECTOMY;  Surgeon: Irving Copas., MD;  Location: [facility];  Service: Gastroenterology;;  ? RENAL ANGIOGRAPHY Bilateral 04/11/2019  ? Procedure: RENAL ANGIOGRAPHY;  Surgeon: Adrian Prows, MD;  Location: [place] [facility];  Service: Cardiovascular;  Laterality: Bilateral;  ? SUBMUCOSAL LIFTING INJECTION  11/29/2019  ? Procedure: SUBMUCOSAL LIFTING INJECTION;  Surgeon: Irving Copas., MD;  Location: [facility];  Service: Gastroenterology;;  ? TOTAL KNEE ARTHROPLASTY Left 08/01/2019  ? Procedure: LEFT TOTAL KNEE ARTHROPLASTY;  Surgeon: Meredith Pel, MD;  Location: [place];  Service: Orthopedics;  Laterality: Left;  ? ? ?There were no vitals filed for this visit. ?  ?  Subjective Assessment - 04/17/21 0813  ?  ?  Subjective  Pt reports he followed up w/ his PCP after prior visit and they have adjusted some of his medications  ?  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  Limitations Visual deficits; would benefit from large print for pt instructions/handouts   ?  Patient Stated Goals Get back to  living alone   ?  Currently in Pain? No/denies   ?  ?   ?  ?  ?   ? ?  OT Education - 04/17/21 1137  ?  ?  Education Details  Continued condition-specific education, particularly related to neuro reeducation and sensory changes s/p CVA  ?  Person(s) Educated Patient  ?  Methods Explanation  ?  Comprehension Verbalized understanding  ?  ?   ?  ?  ?   ? ?  OT Treatment - 04/17/21  ?  ?  Medication Management  Completed simulated med mgmt activity w/ SPV and no errors using pillbox to facilitate [facility] and processing/safety w/ activity at home. Required initial cue at start of activity for assist w/ problem-solving how to dispense pill w/ instruction to "take 2 pills, 2x / day" ?  ?  [facility] Activity Picking up marbles one at a time, translating each to fingertips, and placing on easy-grip pegs to facilitate in-hand manipulation, [place], and intrinsic hand strengthening. OT graded activity up from 3 marbles at a time to 4 and then 5 w/ positive results. Completed w/ min drops; increased success with decreased speed and w/ repetition. Completed 2 sets w/ OT grading up 2nd attempt by increasing number of pegs and proximity on pegboard  ?  ?   ?  ?  ?   ? ? OT Short Term Goals - 03/14/21 1004   ? ?  ? OT SHORT TERM GOAL #1  ? Title Pt will verbalize understanding of visual compensatory strategies to incorporate during tabletop/near visual acuity activities   ? Baseline No visual compensatory strategies   ? Time 2   ? Period Weeks   ? Status Achieved   03/11/21  ? Target Date 03/18/21   ?  ? OT SHORT TERM GOAL #2  ? Title Pt will be able to complete symbol cancellation task, using AE/compensatory strategies prn w/ 100% accuracy   ? Baseline Completed 50% of page w/ 14/15 correct   ? Time 4   ? Period Weeks   ? Status Achieved   03/14/21 - 24/24 using line guide  ? Target Date 04/01/21   ? ?  ?  ? ?  ? ? OT Long Term Goals - 04/15/21 0954   ? ?  ? OT LONG TERM GOAL #1  ? Title Pt will demonstrate improved participation in functional FM  tasks as evidenced by decreasing 9-HPT time by at least 5 seconds w/ R, dominant hand   ? Baseline 44 sec (L hand 36 sec)   ? Time 8   ? Period Weeks   ? Status Achieved   03/18/21 - 35 sec  ? Target Date 04/29/21   ?  ? OT LONG TERM GOAL #2  ? Title Pt will be able to sort medications (simulated or actual) correctly, using AE/compensatory strategies prn   ? Baseline Not currently managing medication   ? Time 8   ? Period Weeks   ?  Status On-going   ? Target Date 04/29/21   ?  ? OT LONG TERM GOAL #3  ? Title Pt will be able to complete simulated meal prep activity w/ Mod I   ? Baseline Not currently participating in meal prep   ? Time 8   ? Period Weeks   ? Status Achieved   04/03/21 - completing independently at home, per pt report  ? Target Date 04/29/21   ?  ? OT LONG TERM GOAL #4  ? Title Pt will demonstrate independence w/ compensatory strategies, including AE prn, to improve handwriting legibility and efficiency   ? Baseline Fair legibility; tremor observed   ? Time 8   ? Period Weeks   ? Status On-going   ? Target Date 04/29/21   ?  ? OT LONG TERM GOAL #5  ? Title Pt will be able to complete TMT: Part B in less than 1 min, 30 sec to indicate potential appropriateness for initiating return to driving   ? Baseline Over 1 min, 30 sec   ? Time 8   ? Period Weeks   ? Status On-going   04/15/21 - 2 min, 18 sec  ? Target Date 04/29/21   ? ?  ?  ? ?  ? ?  Plan - 04/17/21 0816  ?  ?  Clinical Impression Statement OT continued to address [facility] of R, dominant side and safety w/ medication managment. Improvement w/ simulated med management activity observed this session (no errors) likely at least in part due to pt's report of practicing at home; pt may benefit from observed practice of organizing an increased number of medications or medications w/ novel instruction to assess problem-solving within activity. [facility] also appears improved this session w/ pt able to complete higher level FM skill, palm-to-fingertip translation w/  min drops.  ?  OT Occupational Profile and History Detailed Assessment- Review of Records and additional review of physical, cognitive, psychosocial history related to current functional performance   ?  O

## 2021-04-22 ENCOUNTER — Other Ambulatory Visit: Payer: Self-pay

## 2021-04-22 ENCOUNTER — Encounter: Payer: Self-pay | Admitting: Physical Therapy

## 2021-04-22 ENCOUNTER — Ambulatory Visit: Payer: Medicare Other | Admitting: Speech Pathology

## 2021-04-22 ENCOUNTER — Encounter: Payer: Self-pay | Admitting: Speech Pathology

## 2021-04-22 ENCOUNTER — Ambulatory Visit: Payer: Medicare Other | Admitting: Physical Therapy

## 2021-04-22 ENCOUNTER — Encounter: Payer: Self-pay | Admitting: Occupational Therapy

## 2021-04-22 DIAGNOSIS — R278 Other lack of coordination: Secondary | ICD-10-CM | POA: Diagnosis not present

## 2021-04-22 DIAGNOSIS — M25662 Stiffness of left knee, not elsewhere classified: Secondary | ICD-10-CM | POA: Diagnosis not present

## 2021-04-22 DIAGNOSIS — R262 Difficulty in walking, not elsewhere classified: Secondary | ICD-10-CM

## 2021-04-22 DIAGNOSIS — R2689 Other abnormalities of gait and mobility: Secondary | ICD-10-CM

## 2021-04-22 DIAGNOSIS — M6281 Muscle weakness (generalized): Secondary | ICD-10-CM | POA: Diagnosis not present

## 2021-04-22 DIAGNOSIS — R29818 Other symptoms and signs involving the nervous system: Secondary | ICD-10-CM | POA: Diagnosis not present

## 2021-04-22 DIAGNOSIS — R2681 Unsteadiness on feet: Secondary | ICD-10-CM

## 2021-04-22 DIAGNOSIS — I69351 Hemiplegia and hemiparesis following cerebral infarction affecting right dominant side: Secondary | ICD-10-CM | POA: Diagnosis not present

## 2021-04-22 DIAGNOSIS — R41841 Cognitive communication deficit: Secondary | ICD-10-CM

## 2021-04-22 NOTE — Therapy (Signed)
[facility] ?[place] ?[address] ?[place], [place], [address] ?Phone: [phone]   Fax:  [phone] ? ?Speech Language Pathology Treatment ? ?Patient Details  ?Name: Joe Stewart ?MRN: [id number] ?Date of Birth: 1955/07/30 ?Referring Provider (SLP): Bonnielee Haff, MD ? ? ?Encounter Date: 04/22/2021 ? ? End of Session - 04/22/21 0847   ? ? Visit Number 11   ? Number of Visits 17   ? Date for SLP Re-Evaluation 06/01/21   ? SLP Start Time 0845   ? SLP Stop Time  0925   ? SLP Time Calculation (min) 40 min   ? Activity Tolerance Patient tolerated treatment well   ? ?  ?  ? ?  ? ? ?Past Medical History:  ?Diagnosis Date  ? Arthritis   ? Cataract   ? Mixed OU  ? Diabetes mellitus without complication ([place])   ? Diabetic retinopathy ([place])   ? NPDR OU  ? Hyperlipidemia   ? Hypertension   ? Hypertensive retinopathy   ? OU  ? Obesity   ? ? ?Past Surgical History:  ?Procedure Laterality Date  ? BIOPSY  11/29/2019  ? Procedure: BIOPSY;  Surgeon: Irving Copas., MD;  Location: [facility];  Service: Gastroenterology;;  ? COLONOSCOPY WITH PROPOFOL N/A 11/29/2019  ? Procedure: COLONOSCOPY WITH PROPOFOL;  Surgeon: Mansouraty, Telford Nab., MD;  Location: [facility];  Service: Gastroenterology;  Laterality: N/A;  ? ENDOSCOPIC MUCOSAL RESECTION N/A 11/29/2019  ? Procedure: ENDOSCOPIC MUCOSAL RESECTION;  Surgeon: Rush Landmark Telford Nab., MD;  Location: [facility];  Service: Gastroenterology;  Laterality: N/A;  ? ESOPHAGOGASTRODUODENOSCOPY (EGD) WITH PROPOFOL N/A 11/29/2019  ? Procedure: ESOPHAGOGASTRODUODENOSCOPY (EGD) WITH PROPOFOL;  Surgeon: Rush Landmark Telford Nab., MD;  Location: [facility];  Service: Gastroenterology;  Laterality: N/A;  ? EYE SURGERY Left   ? had to have eye flushed after getting costic sodium in L eye  ? HEMOSTASIS CLIP PLACEMENT  11/29/2019  ? Procedure: HEMOSTASIS CLIP PLACEMENT;  Surgeon: Irving Copas., MD;  Location: [facility];  Service:  Gastroenterology;;  ? HERNIA REPAIR    ? HERNIA REPAIR    ? 35 years ago  ? HOT HEMOSTASIS N/A 11/29/2019  ? Procedure: HOT HEMOSTASIS (ARGON PLASMA COAGULATION/BICAP);  Surgeon: Irving Copas., MD;  Location: [facility];  Service: Gastroenterology;  Laterality: N/A;  ? LEFT HEART CATH AND CORONARY ANGIOGRAPHY N/A 04/11/2019  ? Procedure: LEFT HEART CATH AND CORONARY ANGIOGRAPHY;  Surgeon: Adrian Prows, MD;  Location: [place] [facility];  Service: Cardiovascular;  Laterality: N/A;  ? POLYPECTOMY  11/29/2019  ? Procedure: POLYPECTOMY;  Surgeon: Irving Copas., MD;  Location: [facility];  Service: Gastroenterology;;  ? RENAL ANGIOGRAPHY Bilateral 04/11/2019  ? Procedure: RENAL ANGIOGRAPHY;  Surgeon: Adrian Prows, MD;  Location: [place] [facility];  Service: Cardiovascular;  Laterality: Bilateral;  ? SUBMUCOSAL LIFTING INJECTION  11/29/2019  ? Procedure: SUBMUCOSAL LIFTING INJECTION;  Surgeon: Irving Copas., MD;  Location: [facility];  Service: Gastroenterology;;  ? TOTAL KNEE ARTHROPLASTY Left 08/01/2019  ? Procedure: LEFT TOTAL KNEE ARTHROPLASTY;  Surgeon: Meredith Pel, MD;  Location: [place];  Service: Orthopedics;  Laterality: Left;  ? ? ?There were no vitals filed for this visit. ? ? Subjective Assessment - 04/22/21 0845   ? ? Subjective "A little tired."   ? Currently in Pain? No/denies   ? ?  ?  ? ?  ? ? ? ? ? ? ? ? ADULT SLP TREATMENT - 04/22/21 0906   ? ?  ?  General Information  ? Behavior/Cognition Alert;Cooperative   ?  ? Treatment Provided  ? Treatment provided Cognitive-Linquistic   ?  ? Cognitive-Linquistic Treatment  ? Treatment focused on Cognition   ? Skilled Treatment Facilitated increased working memory for conversations via edu on [employer] to facilitate new learning and memory retention. SLP utilized spaced retrieval to increase recall. Pt was able to recall WRAP strategies with 3/4 post 4 minut recall. Will continue to practice to facilitate use of strategies at  home.   ?  ? Assessment / Recommendations / Plan  ? Plan Continue with current plan of care   ?  ? Progression Toward Goals  ? Progression toward goals Progressing toward goals   ? ?  ?  ? ?  ? ? ? ? ? SLP Short Term Goals - 04/01/21 1536   ? ?  ? SLP SHORT TERM GOAL #1  ? Title Pt will increase auditory comprehension by recalling and verbalizing strategies to assist with active listening during conversation with minA.   ? Time 4   ? Period Weeks   ? Status Achieved   ? Target Date 04/01/21   ?  ? SLP SHORT TERM GOAL #2  ? Title Pt will comprehend functional memory or visual aids for recall of important information during structured conversations with minA.   ? Time 4   ? Period Weeks   ? Status On-going   Not met  ? Target Date 04/01/21   ?  ? SLP SHORT TERM GOAL #3  ? Title Pt will complete BNT naming test to identify anomia.   ? Time 3   ? Period Weeks   ? Status On-going   Not met  ? Target Date 03/25/21   ? ?  ?  ? ?  ? ? ? SLP Long Term Goals - 03/04/21 1249   ? ?  ? SLP LONG TERM GOAL #1  ? Title Pt will increase auditory comprehension by recalling and verbalizing strategies to assist with active listening during  structured conversation independently.   ? Time 8   ? Period Weeks   ? Status New   ? Target Date 04/29/21   ?  ? SLP LONG TERM GOAL #2  ? Title Pt will comprehend functional memory or visual aids for recall of important information during structured conversation independently   ? Time 8   ? Period Weeks   ? Status New   ? Target Date 04/29/21   ? ?  ?  ? ?  ? ? ? Plan - 04/22/21 0904   ? ? Clinical Impression Statement See tx note. Pt reports he has not been reviewing strategies at home. SLP stressed importance of reviewing strategies to increase ability to recall strategies for consistent use.  Cont with current POC.   ? Speech Therapy Frequency 2x / week   ? Duration 8 weeks   ? Treatment/Interventions Language facilitation;Environmental controls;Cueing hierarchy;SLP instruction and  feedback;Cognitive reorganization;Functional tasks;Compensatory strategies;Internal/external aids;Multimodal communcation approach;Patient/family education   ? Potential to Achieve Goals Good   ? Consulted and Agree with Plan of Care Patient;Family member/caregiver   ? Family Member Consulted Brother   ? ?  ?  ? ?  ? ? ?Patient will benefit from skilled therapeutic intervention in order to improve the following deficits and impairments:   ?Cognitive communication deficit ? ? ? ?Problem List ?Patient Active Problem List  ? Diagnosis Date Noted  ? AKI (acute kidney injury) ([place]) 02/24/2021  ? Acute  CVA (cerebrovascular accident) ([place]) 02/19/2021  ? Hyperlipidemia 02/19/2021  ? Anemia 10/12/2019  ? Cecal polyp 09/30/2019  ? History of colon polyps 09/30/2019  ? Abnormal colonoscopy 09/30/2019  ? Arthritis of left knee 08/01/2019  ? Type 2 diabetes mellitus without complication, without long-term current use of insulin ([place]) 05/01/2019  ? Dyspnea on exertion 04/10/2019  ? Abnormal nuclear stress test 04/10/2019  ? Essential hypertension 02/27/2019  ? Acute pain of left knee 02/27/2019  ? ? ?Verdene Lennert, CCC-SLP ?04/22/2021, 9:32 AM ? ?[place] ?[place] ?[address] ?[place], [place], [address] ?Phone: [phone]   Fax:  [phone] ? ? ?Name: Arliss Frisina ?MRN: [id number] ?Date of Birth: [birth year] ? ?

## 2021-04-22 NOTE — Therapy (Signed)
[facility] ?[place] ?[address] ?[place], [place], [address] ?Phone: [phone]   Fax:  [phone] ? ?Physical Therapy Treatment ? ?Patient Details  ?Name: Joe Stewart ?MRN: [id number] ?Date of Birth: Oct 26, 1955 ?No data recorded ? ?Encounter Date: 04/22/2021 ? ? PT End of Session - 04/22/21 0849   ? ? Visit Number 11   ? Date for PT Re-Evaluation 06/01/21   ? Authorization Type [employer]   ? PT Start Time [phone]   ? PT Stop Time [phone]   ? PT Time Calculation (min) 40 min   ? Activity Tolerance Patient tolerated treatment well   ? Behavior During Therapy [facility] for tasks assessed/performed   ? ?  ?  ? ?  ? ? ?Past Medical History:  ?Diagnosis Date  ? Arthritis   ? Cataract   ? Mixed OU  ? Diabetes mellitus without complication ([place])   ? Diabetic retinopathy ([place])   ? NPDR OU  ? Hyperlipidemia   ? Hypertension   ? Hypertensive retinopathy   ? OU  ? Obesity   ? ? ?Past Surgical History:  ?Procedure Laterality Date  ? BIOPSY  11/29/2019  ? Procedure: BIOPSY;  Surgeon: Irving Copas., MD;  Location: [facility];  Service: Gastroenterology;;  ? COLONOSCOPY WITH PROPOFOL N/A 11/29/2019  ? Procedure: COLONOSCOPY WITH PROPOFOL;  Surgeon: Mansouraty, Telford Nab., MD;  Location: [facility];  Service: Gastroenterology;  Laterality: N/A;  ? ENDOSCOPIC MUCOSAL RESECTION N/A 11/29/2019  ? Procedure: ENDOSCOPIC MUCOSAL RESECTION;  Surgeon: Rush Landmark Telford Nab., MD;  Location: [facility];  Service: Gastroenterology;  Laterality: N/A;  ? ESOPHAGOGASTRODUODENOSCOPY (EGD) WITH PROPOFOL N/A 11/29/2019  ? Procedure: ESOPHAGOGASTRODUODENOSCOPY (EGD) WITH PROPOFOL;  Surgeon: Rush Landmark Telford Nab., MD;  Location: [facility];  Service: Gastroenterology;  Laterality: N/A;  ? EYE SURGERY Left   ? had to have eye flushed after getting costic sodium in L eye  ? HEMOSTASIS CLIP PLACEMENT  11/29/2019  ? Procedure: HEMOSTASIS CLIP PLACEMENT;  Surgeon: Irving Copas., MD;  Location:  [facility];  Service: Gastroenterology;;  ? HERNIA REPAIR    ? HERNIA REPAIR    ? 35 years ago  ? HOT HEMOSTASIS N/A 11/29/2019  ? Procedure: HOT HEMOSTASIS (ARGON PLASMA COAGULATION/BICAP);  Surgeon: Irving Copas., MD;  [facility]: [facility];  Service: Gastroenterology;  Laterality: N/A;  ? LEFT HEART CATH AND CORONARY ANGIOGRAPHY N/A 04/11/2019  ? Procedure: LEFT HEART CATH AND CORONARY ANGIOGRAPHY;  Surgeon: Adrian Prows, MD;  Location: [place] [facility];  Service: Cardiovascular;  Laterality: N/A;  ? POLYPECTOMY  11/29/2019  ? Procedure: POLYPECTOMY;  Surgeon: Irving Copas., MD;  Location: [facility];  Service: Gastroenterology;;  ? RENAL ANGIOGRAPHY Bilateral 04/11/2019  ? Procedure: RENAL ANGIOGRAPHY;  Surgeon: Adrian Prows, MD;  Location: [place] [facility];  Service: Cardiovascular;  Laterality: Bilateral;  ? SUBMUCOSAL LIFTING INJECTION  11/29/2019  ? Procedure: SUBMUCOSAL LIFTING INJECTION;  Surgeon: Irving Copas., MD;  [facility]: [facility];  Service: Gastroenterology;;  ? TOTAL KNEE ARTHROPLASTY Left 08/01/2019  ? Procedure: LEFT TOTAL KNEE ARTHROPLASTY;  Surgeon: Meredith Pel, MD;  Location: [facility];  Service: Orthopedics;  Laterality: Left;  ? ? ?There were no vitals filed for this visit. ? ? Subjective Assessment - 04/22/21 0804   ? ? Subjective I'm doing OK, stil want to focus on balance and coordination. I had that episode of tunnel vision with Merleen Nicely last week, I went to the doctor immediately after leaving here and they  backed off on one of my pills I can't remember which. Some of my meds make me feel weird in the mornings. No other issues like last week/no other symptoms.   ? Patient Stated Goals walk better, be more independent, have better balance, drive   ? Currently in Pain? No/denies   ? ?  ?  ? ?  ? ? ? ? ? ? ? ? ? ? ? ? ? ? ? ? ? ? ? ? [facility] Adult PT Treatment/Exercise - 04/22/21 0001   ? ?  ? Knee/Hip Exercises: Aerobic  ? Nustep L5x6 minutes BLEs only    80-100spm  ? ?  ?  ? ?  ? ? ? ? ? ? Balance Exercises - 04/22/21 0001   ? ?  ? Balance Exercises: Standing  ? Tandem Stance Eyes open;Foam/compliant surface;3 reps;30 secs   ? Sidestepping Foam/compliant support;4 reps   in // bars  ? Marching Foam/compliant surface;Static;20 reps   high knees  ? Other Standing Exercises body blade on foam pad 60  seconds vertical, 60 seconds horizontal for external perturbation training   ? Other Standing Exercises Comments alternating forward cone taps on blue foam pad 1x20; 1-2 cone taps on 4 and 8 inch steps solid surface 1x5 each side   ? ?  ?  ? ?  ? ? ? ? ? PT Education - 04/22/21 0849   ? ? Education Details exercise form/purpose   ? Person(s) Educated Patient   ? Methods Explanation   ? Comprehension Verbalized understanding   ? ?  ?  ? ?  ? ? ? PT Short Term Goals - 04/01/21 1145   ? ?  ? PT SHORT TERM GOAL #1  ? Title independent with initial HEP   ? Baseline Patient encouraged to perofrm HEP.   ? Time 1   ? Period Weeks   ? Status Achieved   ? ?  ?  ? ?  ? ? ? ? PT Long Term Goals - 04/17/21 0924   ? ?  ? PT LONG TERM GOAL #1  ? Title Pt will be I and compliant with HEP.   ? Status On-going   ?  ? PT LONG TERM GOAL #2  ? Title Pt will improve right hip/knee strength to 5/5 MMT tested in sitting to improve overall funciton   ? Baseline 4/5, decresed coordinated control functionally.   ? Time 8   ? Period Weeks   ? Status On-going   ?  ? PT LONG TERM GOAL #3  ? Title improve berg balance score to 47/56   ? Baseline 50   ? Period Weeks   ? Status Achieved   ?  ? PT LONG TERM GOAL #4  ? Title increase DGI score to 19   ? Baseline 20   ? Period Weeks   ? Status Achieved   ? ?  ?  ? ?  ? ? ? ? ? ? ? ? Plan - 04/22/21 0849   ? ? Clinical Impression Statement Joe Stewart arrives today doing OK, no more episodes of tunnel vision/light headedness since last week. We continued focusing on higher level balance and motor coordination, does still struggle with these activities. BP OK today,  138/83 and no symptoms throughout session. Will continue efforts.   ? Stability/Clinical Decision Making Evolving/Moderate complexity   ? Clinical Decision Making Low   ? Rehab Potential Good   ? PT Frequency 2x / week   ?  PT Duration Other (comment)   ? PT Treatment/Interventions ADLs/Self Care Home Management;Gait training;Neuromuscular re-education;Balance training;Therapeutic exercise;Therapeutic activities;Functional mobility training;Stair training;Patient/family education;Manual techniques   ? PT Next Visit Plan continue to work on gait, speed and balance, strength   ? PT Home Exercise Plan [id number],  [id number]   ? Consulted and Agree with Plan of Care Patient   ? ?  ?  ? ?  ? ? ?Patient will benefit from skilled therapeutic intervention in order to improve the following deficits and impairments:  Abnormal gait, Decreased coordination, Decreased range of motion, Difficulty walking, Dizziness, Decreased activity tolerance, Decreased balance, Decreased strength, Decreased mobility ? ?Visit Diagnosis: ?Hemiplegia and hemiparesis following cerebral infarction affecting right dominant side ([place]) ? ?Unsteadiness on feet ? ?Difficulty in walking, not elsewhere classified ? ?Other abnormalities of gait and mobility ? ? ? ? ?Problem List ?Patient Active Problem List  ? Diagnosis Date Noted  ? AKI (acute kidney injury) ([place]) 02/24/2021  ? Acute CVA (cerebrovascular accident) ([place]) 02/19/2021  ? Hyperlipidemia 02/19/2021  ? Anemia 10/12/2019  ? Cecal polyp 09/30/2019  ? History of colon polyps 09/30/2019  ? Abnormal colonoscopy 09/30/2019  ? Arthritis of left knee 08/01/2019  ? Type 2 diabetes mellitus without complication, without long-term current use of insulin ([place]) 05/01/2019  ? Dyspnea on exertion 04/10/2019  ? Abnormal nuclear stress test 04/10/2019  ? Essential hypertension 02/27/2019  ? Acute pain of left knee 02/27/2019  ? ?Junior Kenedy U PT, DPT, PN2  ? ?Supplemental Physical Therapist ?[place]  ? ? ? ? ? ?[facility] ?[address] ?[address] ?[place], [place], [address] ?Phone: [phone]   Fax:  [phone] ? ?Name: Joe Stewart ?MRN: [id number] ?Date of Birth: [birth year]

## 2021-04-23 ENCOUNTER — Encounter: Payer: Self-pay | Admitting: Podiatry

## 2021-04-23 ENCOUNTER — Ambulatory Visit: Payer: Medicare Other | Admitting: Podiatry

## 2021-04-23 DIAGNOSIS — E11621 Type 2 diabetes mellitus with foot ulcer: Secondary | ICD-10-CM | POA: Diagnosis not present

## 2021-04-23 DIAGNOSIS — L97512 Non-pressure chronic ulcer of other part of right foot with fat layer exposed: Secondary | ICD-10-CM

## 2021-04-23 DIAGNOSIS — E119 Type 2 diabetes mellitus without complications: Secondary | ICD-10-CM | POA: Diagnosis not present

## 2021-04-23 NOTE — Progress Notes (Signed)
?  Subjective:  ?Patient ID: Joe Stewart, male    DOB: 11-18-1955,   MRN: [id number] ? ?No chief complaint on file. ? ? ?66 y.o. male presents for folow-up of right foot wound. Relates it seems to be getting better. Has not been wearing the surgical shoe and been in a regular tennis shoe . No pain He is diabetic and last A1c was 12.3  . Denies any other pedal complaints. Denies n/v/f/c.  ? ?PCP: [name] MD  ? ?Past Medical History:  ?Diagnosis Date  ? Arthritis   ? Cataract   ? Mixed OU  ? Diabetes mellitus without complication ([place])   ? Diabetic retinopathy ([place])   ? NPDR OU  ? Hyperlipidemia   ? Hypertension   ? Hypertensive retinopathy   ? OU  ? Obesity   ? ? ?Objective:  ?Physical Exam: ?Vascular: DP/PT pulses 2/4 bilateral. CFT <3 seconds. Normal hair growth on digits. No edema.  ?Skin. No lacerations or abrasions bilateral feet. Right fifth lateral digit with wound noted measuring about 0.5 cm x 0.5 cm x 0.2 cm with granular base. Undelrying hyperkeratotic tissue No erythema edema or purulence noted.  ?Musculoskeletal: MMT 5/5 bilateral lower extremities in DF, PF, Inversion and Eversion. Deceased ROM in DF of ankle joint.  ?Neurological: Sensation intact to light touch.  ? ?Assessment:  ? ?1. Diabetic ulcer of toe of right foot associated with type 2 diabetes mellitus, limited to breakdown of skin ([place])   ?2. Type 2 diabetes mellitus without complication, without long-term current use of insulin ([place])   ? ? ? ? ?Plan:  ?Patient was evaluated and treated and all questions answered. ?Ulcer right fifth toe limited to breakdown of skin  ?-Debridement as below. ?-Dressed with betadine, DSD. ?-Off-loading with surgical shoe. [place] here today in regular shoes. Toe caps did not stay on. Discussed wearing surgical shoe again as this will speed the healing process. Discussed wearing any shoe that does not cause rubbing in the fifth toe.  ?-No abx indicated.  ?-Discussed glucose control and proper protein-rich  diet.  ?-Discussed if any worsening redness, pain, fever or chills to call or may need to report to the emergency room. Patient expressed understanding.  ? ?Procedure: Excisional Debridement of Wound ?Rationale: Removal of non-viable soft tissue from the wound to promote healing.  ?Anesthesia: none ?Pre-Debridement Wound Measurements: Overlying callus  ?Post-Debridement Wound Measurements: 0.5 cm x 0.5 cm x 0.1 cm  ?Type of Debridement: Sharp Excisional ?Tissue Removed: Non-viable soft tissue ?Depth of Debridement: subcutaneous tissue. ?Technique: Sharp excisional debridement to bleeding, viable wound base.  ?Dressing: Dry, sterile, compression dressing. ?Disposition: Patient tolerated procedure well. Patient to return in 2 week for follow-up. ? ?No follow-ups on file. ? ?No follow-ups on file. ? ? ?Lorenda Peck, DPM  ? ? ?

## 2021-04-24 ENCOUNTER — Encounter: Payer: Self-pay | Admitting: Neurology

## 2021-04-24 ENCOUNTER — Ambulatory Visit: Payer: Medicare Other | Admitting: Neurology

## 2021-04-24 VITALS — BP 151/81 | HR 76 | Ht 68.0 in | Wt 240.0 lb

## 2021-04-24 DIAGNOSIS — I639 Cerebral infarction, unspecified: Secondary | ICD-10-CM | POA: Insufficient documentation

## 2021-04-24 DIAGNOSIS — R0683 Snoring: Secondary | ICD-10-CM | POA: Diagnosis not present

## 2021-04-24 NOTE — Progress Notes (Signed)
?Cardiology Office Note:   ? ?Date:  04/25/2021  ? ?ID:  Dempsey Ahonen, DOB 10/10/1955, MRN [id number] ? ?PCP:  [name], NP ?  ?[facility] Providers ?Cardiologist:  Werner Lean, MD    ? ?Referring MD: [name], NP  ? ?CC: Second opinion ILR  ?Consulted for the evaluation of Transition to new cardiologist at the behest of [name], NP ? ? ?History of Present Illness:   ? ?Joe Stewart is a 66 y.o. male with a hx of Moderate not obstructive CAD, CKD Stage IIIa with DM, HTN wand Familial tryglceridemia, prior stroke (Left PCA infarct)Seen by Dr. Rex Kras who presents 04/25/21 for care. ? ?Notes that he recently came off of all his DM medications.  He ended up having a a left PCA stroke.  Couldn't think straight and and visual deficits.  Found to have Left PCA stroke and suggestive of embolic disease.  Has improved with rehab.  Is back on his medications.   ? ?Patient reports prior cardiac testing including  ?EKG with prior RBBB ?Echo low normal LVEF 2021 ?Positive NM Stress 2021 ?Moderate diffuse no obstructive CAD from 2021 LHC ?Had questions about ILR. ? ? ?Past Medical History:  ?Diagnosis Date  ? Arthritis   ? Cataract   ? Mixed OU  ? Diabetes mellitus without complication ([place])   ? Diabetic retinopathy ([place])   ? NPDR OU  ? Hyperlipidemia   ? Hypertension   ? Hypertensive retinopathy   ? OU  ? Obesity   ? ? ?Past Surgical History:  ?Procedure Laterality Date  ? BIOPSY  11/29/2019  ? Procedure: BIOPSY;  Surgeon: Irving Copas., MD;  Location: [facility];  Service: Gastroenterology;;  ? COLONOSCOPY WITH PROPOFOL N/A 11/29/2019  ? Procedure: COLONOSCOPY WITH PROPOFOL;  Surgeon: Mansouraty, Telford Nab., MD;  Location: [facility];  Service: Gastroenterology;  Laterality: N/A;  ? ENDOSCOPIC MUCOSAL RESECTION N/A 11/29/2019  ? Procedure: ENDOSCOPIC MUCOSAL RESECTION;  Surgeon: Rush Landmark Telford Nab., MD;  Location: [facility];  Service: Gastroenterology;  Laterality: N/A;   ? ESOPHAGOGASTRODUODENOSCOPY (EGD) WITH PROPOFOL N/A 11/29/2019  ? Procedure: ESOPHAGOGASTRODUODENOSCOPY (EGD) WITH PROPOFOL;  Surgeon: Rush Landmark Telford Nab., MD;  Location: [facility];  Service: Gastroenterology;  Laterality: N/A;  ? EYE SURGERY Left   ? had to have eye flushed after getting costic sodium in L eye  ? HEMOSTASIS CLIP PLACEMENT  11/29/2019  ? Procedure: HEMOSTASIS CLIP PLACEMENT;  Surgeon: Irving Copas., MD;  Location: [facility];  Service: Gastroenterology;;  ? HERNIA REPAIR    ? HERNIA REPAIR    ? 35 years ago  ? HOT HEMOSTASIS N/A 11/29/2019  ? Procedure: HOT HEMOSTASIS (ARGON PLASMA COAGULATION/BICAP);  Surgeon: Irving Copas., MD;  Location: [facility];  Service: Gastroenterology;  Laterality: N/A;  ? LEFT HEART CATH AND CORONARY ANGIOGRAPHY N/A 04/11/2019  ? Procedure: LEFT HEART CATH AND CORONARY ANGIOGRAPHY;  Surgeon: Adrian Prows, MD;  Location: [facility];  Service: Cardiovascular;  Laterality: N/A;  ? POLYPECTOMY  11/29/2019  ? Procedure: POLYPECTOMY;  Surgeon: Irving Copas., MD;  Location: [facility];  Service: Gastroenterology;;  ? RENAL ANGIOGRAPHY Bilateral 04/11/2019  ? Procedure: RENAL ANGIOGRAPHY;  Surgeon: Adrian Prows, MD;  Location: [facility];  Service: Cardiovascular;  Laterality: Bilateral;  ? SUBMUCOSAL LIFTING INJECTION  11/29/2019  ? Procedure: SUBMUCOSAL LIFTING INJECTION;  Surgeon: Irving Copas., MD;  Location: [facility];  Service: Gastroenterology;;  ? TOTAL KNEE ARTHROPLASTY Left 08/01/2019  ? Procedure: LEFT TOTAL  KNEE ARTHROPLASTY;  Surgeon: Meredith Pel, MD;  Location: [facility];  Service: Orthopedics;  Laterality: Left;  ? ? ?Current Medications: ?Current Meds  ?Medication Sig  ? amLODipine (NORVASC) 10 MG tablet Take 1 tablet (10 mg total) by mouth daily.  ? aspirin EC 81 MG tablet Take 1 tablet (81 mg total) by mouth daily. Swallow whole.  ? atorvastatin (LIPITOR) 80 MG tablet Take 1 tablet (80 mg  total) by mouth daily.  ? blood glucose meter kit and supplies KIT Dispense based on patient and insurance preference. Use up to four times daily as directed.  ? clonazePAM (KLONOPIN) 0.25 MG disintegrating tablet Take 1 tablet (0.25 mg total) by mouth 2 (two) times daily. (Patient taking differently: Take 0.25 mg by mouth as needed.)  ? Continuous Blood Gluc Sensor (DEXCOM G6 SENSOR) MISC Apply one every 10 days per package instructions  ? Continuous Blood Gluc Transmit (DEXCOM G6 TRANSMITTER) MISC 1 each by Does not apply route every 3 (three) months.  ? empagliflozin (JARDIANCE) 25 MG TABS tablet Take 1 tablet (25 mg total) by mouth daily before breakfast.  ? hydrochlorothiazide (HYDRODIURIL) 12.5 MG tablet Take 1 tablet (12.5 mg total) by mouth daily.  ? losartan (COZAAR) 50 MG tablet Take 1 tablet (50 mg total) by mouth daily at 10 pm.  ? metoprolol succinate (TOPROL-XL) 50 MG 24 hr tablet Take 1 tablet (50 mg total) by mouth daily. Take with or immediately following a meal.  ?  ? ?Allergies:   Patient has no known allergies.  ? ?Social History  ? ?Socioeconomic History  ? Marital status: Single  ?  Spouse name: Not on file  ? Number of children: 1  ? Years of education: Not on file  ? Highest education level: Not on file  ?Occupational History  ? Not on file  ?Tobacco Use  ? Smoking status: Never  ? Smokeless tobacco: Never  ?Vaping Use  ? Vaping Use: Never used  ?Substance and Sexual Activity  ? Alcohol use: Not Currently  ? Drug use: Never  ? Sexual activity: Not Currently  ?Other Topics Concern  ? Not on file  ?Social History Narrative  ? Not on file  ? ?Social Determinants of Health  ? ?Financial Resource Strain: Low Risk   ? Difficulty of Paying Living Expenses: Not hard at all  ?Food Insecurity: No Food Insecurity  ? Worried About Charity fundraiser in the Last Year: Never true  ? Ran Out of Food in the Last Year: Never true  ?Transportation Needs: No Transportation Needs  ? Lack of Transportation  (Medical): No  ? Lack of Transportation (Non-Medical): No  ?Physical Activity: Not on file  ?Stress: Not on file  ?Social Connections: Not on file  ?  ? ?Family History: ?The patient's family history includes COPD in his mother and sister; Cancer in his mother; Diabetes in his mother; Healthy in his brother and son; Heart disease in his brother and father. There is no history of Colon cancer, Colon polyps, Esophageal cancer, Rectal cancer, Stomach cancer, Inflammatory bowel disease, Liver disease, or Pancreatic cancer. ? ?ROS:   ?Please see the history of present illness.    ? All other systems reviewed and are negative. ? ?EKGs/Labs/Other Studies Reviewed:   ? ?The following studies were reviewed today: ? ?Recent Labs: ?04/09/2021: Magnesium 1.8 ?04/15/2021: ALT 23; BUN 47; Creatinine, Ser 1.59; Hemoglobin 13.1; Platelets 229.0; Potassium 4.4; Sodium 139; TSH 1.55  ?Recent Lipid Panel ?   ?Component  Value Date/Time  ? CHOL 115 04/15/2021 1425  ? CHOL 146 04/07/2019 0929  ? TRIG 212.0 (H) 04/15/2021 1425  ? HDL 33.50 (L) 04/15/2021 1425  ? HDL 37 (L) 04/07/2019 0929  ? CHOLHDL 3 04/15/2021 1425  ? VLDL 42.4 (H) 04/15/2021 1425  ? LDLCALC UNABLE TO CALCULATE IF TRIGLYCERIDE OVER 400 mg/dL 02/20/2021 0455  ? [place] 81 04/07/2019 0929  ? LDLDIRECT 55.0 04/15/2021 1425  ? ?    ?Physical Exam:   ? ?VS:  BP 116/72   Pulse 80   Ht '5\' 8"'  (1.727 m)   Wt 241 lb (109.3 kg)   SpO2 97%   BMI 36.64 kg/m?    ? ?Wt Readings from Last 3 Encounters:  ?04/25/21 241 lb (109.3 kg)  ?04/24/21 240 lb (108.9 kg)  ?04/15/21 258 lb (117 kg)  ?  ? ?Gen: no distress, morbid obesity   ?Neck: No JVD,  ?Ears: R ear Pilar Plate Sign ?Cardiac: No Rubs or Gallops, no murmur, RRR +2 radial pulses ?Respiratory: Clear to auscultation bilaterally, normal effort, normal  respiratory rate ?GI: Soft, nontender, non-distended  ?MS: +1 bilateral pitting  edema;  moves all extremities ?Integument: Skin feels warm ?Neuro:  At time of evaluation, alert and  oriented to person/place/time/situation  ?Psych: Normal affect, patient feels well  ? ?ASSESSMENT:   ? ?1. History of ischemic stroke   ?2. History of thrombotic stroke without residual deficits   ?3. Coronary artery dis

## 2021-04-24 NOTE — Progress Notes (Signed)
Chief Complaint  Patient presents with   Hospitalization Follow-up    Room 13, with brother  C/o dizziness, balance issue, and blurry vision  [place] [facility]      ASSESSMENT AND PLAN  Joe Stewart is a 66 y.o. 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, and left thalamus, in January 2023  Vascular risk factor of hypertension, diabetes poorly controlled, noncompliant with the medication in the past, hyperlipidemia, obesity  High risk for obstructive sleep apnea  Referral to sleep study  Emphasized importance of optimize blood pressure control goal less than 130/80, diabetic control, A1c less than 7.0, LDL less than 70  Keep aspirin 81 mg daily, increase water intake, moderate exercise   DIAGNOSTIC DATA (LABS, IMAGING, TESTING) - I reviewed patient records, labs, notes, testing and imaging myself where available.   MEDICAL HISTORY:  Joe Stewart is a 65 year old male, seen in request by his primary care nurse practitioner Janeece Agee, to follow-up for stroke, he is accompanied by his brother at today's visit April 24, 2021  I reviewed and summarized the referring note.  Past medical history Diabetes, in 2022 Hypertension  He is a recovering tired mechanics, hospital admission on February 19, 2021 when father found him at house confused, word finding difficulties I personally reviewed MRI of the brain acute left PCA stroke involving posterior mesial temporal lobe with additional scattered infarction in the posterior left thalamus, left occipital lobe.  MRA of brain and neck showed diminutive left vertebral artery, poor visualization of left PCA  Laboratory evaluation showed A1c of 12.3, he was restarted on glimepiride upon discharge,  LDL 124, he was started on Lipitor 80 mg daily  He has a history of hypertension, noncompliant with his medications, previous Cardiac cath showed coronary artery disease,  He is doing better now, has good social support by his  siblings,  Better since the event, he was noted to have short-term memory loss, slow reaction time, careful gait, in addition, there was episode reported sudden onset right leg gave out underneath him in March 2023, mild fatigue confused following that, but no seizure activity.  He has a long history of loud snoring, frequent awakening at nighttime, excessive daytime sleepiness fatigue,  PHYSICAL EXAM:   Vitals:   04/24/21 0759  BP: (!) 151/81  Pulse: 76  Weight: 240 lb (108.9 kg)  Height: 5\' 8"  (1.727 m)   Not recorded     Body mass index is 36.49 kg/m.  PHYSICAL EXAMNIATION:  Gen: NAD, conversant, well nourised, well groomed                     Cardiovascular: Regular rate rhythm, no peripheral edema, warm, nontender. Eyes: Conjunctivae clear without exudates or hemorrhage Neck: Supple, no carotid bruits. Pulmonary: Clear to auscultation bilaterally   NEUROLOGICAL EXAM:  MENTAL STATUS: Obese tired looking middle-age male Speech/cognition: Slow reaction time, oriented to history taking and casual conversation     CRANIAL NERVES: CN II: Visual fields are full to confrontation. Pupils are round equal and briskly reactive to light. CN III, IV, VI: extraocular movement are normal. No ptosis. CN V: Facial sensation is intact to light touch CN VII: Face is symmetric with normal eye closure  CN VIII: Hearing is normal to causal conversation. CN IX, X: Phonation is normal. CN XI: Head turning and shoulder shrug are intact  MOTOR: Mild fixation of left upper extremity on rapid rotating movement  REFLEXES: Reflexes are 1 and symmetric at  the biceps, triceps, knees, and ankles. Plantar responses are flexor.  SENSORY: Intact to light touch, pinprick and vibratory sensation are intact in fingers and toes.  COORDINATION: There is no trunk or limb dysmetria noted.  GAIT/STANCE: Need push-up to get up from seated position, wide-based, cautious  REVIEW OF SYSTEMS:  Full 14  system review of systems performed and notable only for as above All other review of systems were negative.   ALLERGIES: No Known Allergies  HOME MEDICATIONS: Current Outpatient Medications  Medication Sig Dispense Refill   amLODipine (NORVASC) 10 MG tablet Take 1 tablet (10 mg total) by mouth daily. 30 tablet 2   aspirin EC 81 MG tablet Take 1 tablet (81 mg total) by mouth daily. Swallow whole. 30 tablet 11   atorvastatin (LIPITOR) 80 MG tablet Take 1 tablet (80 mg total) by mouth daily. 30 tablet 2   blood glucose meter kit and supplies KIT Dispense based on patient and insurance preference. Use up to four times daily as directed. 1 each 0   clonazePAM (KLONOPIN) 0.25 MG disintegrating tablet Take 1 tablet (0.25 mg total) by mouth 2 (two) times daily. 60 tablet 0   Continuous Blood Gluc Sensor (DEXCOM G6 SENSOR) MISC Apply one every 10 days per package instructions 9 each 1   Continuous Blood Gluc Transmit (DEXCOM G6 TRANSMITTER) MISC 1 each by Does not apply route every 3 (three) months. 1 each 1   empagliflozin (JARDIANCE) 25 MG TABS tablet Take 1 tablet (25 mg total) by mouth daily before breakfast. 90 tablet 0   hydrochlorothiazide (HYDRODIURIL) 12.5 MG tablet Take 1 tablet (12.5 mg total) by mouth daily. 90 tablet 0   losartan (COZAAR) 50 MG tablet Take 1 tablet (50 mg total) by mouth daily at 10 pm. 90 tablet 0   metoprolol succinate (TOPROL-XL) 50 MG 24 hr tablet Take 1 tablet (50 mg total) by mouth daily. Take with or immediately following a meal. 90 tablet 3   No current facility-administered medications for this visit.    PAST MEDICAL HISTORY: Past Medical History:  Diagnosis Date   Arthritis    Cataract    Mixed OU   Diabetes mellitus without complication ([facility])    Diabetic retinopathy ([facility])    NPDR OU   Hyperlipidemia    Hypertension    Hypertensive retinopathy    OU   Obesity     PAST SURGICAL HISTORY: Past Surgical History:  Procedure Laterality Date    BIOPSY  11/29/2019   Procedure: BIOPSY;  Surgeon: Lemar Lofty., MD;  Location: [facility];  Service: Gastroenterology;;   COLONOSCOPY WITH PROPOFOL N/A 11/29/2019   Procedure: COLONOSCOPY WITH PROPOFOL;  Surgeon: Lemar Lofty., MD;  Location: [facility];  Service: Gastroenterology;  Laterality: N/A;   ENDOSCOPIC MUCOSAL RESECTION N/A 11/29/2019   Procedure: ENDOSCOPIC MUCOSAL RESECTION;  Surgeon: Meridee Score Netty Starring., MD;  Location: [facility];  Service: Gastroenterology;  Laterality: N/A;   ESOPHAGOGASTRODUODENOSCOPY (EGD) WITH PROPOFOL N/A 11/29/2019   Procedure: ESOPHAGOGASTRODUODENOSCOPY (EGD) WITH PROPOFOL;  Surgeon: Meridee Score Netty Starring., MD;  Location: [facility];  Service: Gastroenterology;  Laterality: N/A;   EYE SURGERY Left    had to have eye flushed after getting costic sodium in L eye   HEMOSTASIS CLIP PLACEMENT  11/29/2019   Procedure: HEMOSTASIS CLIP PLACEMENT;  Surgeon: Lemar Lofty., MD;  Location: [facility];  Service: Gastroenterology;;   HERNIA REPAIR     HERNIA REPAIR     35 years ago   HOT HEMOSTASIS  N/A 11/29/2019   Procedure: HOT HEMOSTASIS (ARGON PLASMA COAGULATION/BICAP);  Surgeon: Lemar Lofty., MD;  Location: [facility];  Service: Gastroenterology;  Laterality: N/A;   LEFT HEART CATH AND CORONARY ANGIOGRAPHY N/A 04/11/2019   Procedure: LEFT HEART CATH AND CORONARY ANGIOGRAPHY;  Surgeon: Yates Decamp, MD;  Location: [facility];  Service: Cardiovascular;  Laterality: N/A;   POLYPECTOMY  11/29/2019   Procedure: POLYPECTOMY;  Surgeon: Meridee Score Netty Starring., MD;  Location: [facility];  Service: Gastroenterology;;   RENAL ANGIOGRAPHY Bilateral 04/11/2019   Procedure: RENAL ANGIOGRAPHY;  Surgeon: Yates Decamp, MD;  [facility];  Service: Cardiovascular;  Laterality: Bilateral;   SUBMUCOSAL LIFTING INJECTION  11/29/2019   Procedure: SUBMUCOSAL LIFTING INJECTION;  Surgeon: Lemar Lofty., MD;  Location: [facility];  Service: Gastroenterology;;   TOTAL KNEE ARTHROPLASTY Left 08/01/2019   Procedure: LEFT TOTAL KNEE ARTHROPLASTY;  Surgeon: Cammy Copa, MD;  Location: [facility];  Service: Orthopedics;  Laterality: Left;    FAMILY HISTORY: Family History  Problem Relation Age of Onset   Diabetes Mother    COPD Mother    Cancer Mother        unknown type    Heart disease Father    COPD Sister    Healthy Brother    Heart disease Brother    Healthy Son    Colon cancer Neg Hx    Colon polyps Neg Hx    Esophageal cancer Neg Hx    Rectal cancer Neg Hx    Stomach cancer Neg Hx    Inflammatory bowel disease Neg Hx    Liver disease Neg Hx    Pancreatic cancer Neg Hx     SOCIAL HISTORY: Social History   Socioeconomic History   Marital status: Single    Spouse name: Not on file   Number of children: 1   Years of education: Not on file   Highest education level: Not on file  Occupational History   Not on file  Tobacco Use   Smoking status: Never   Smokeless tobacco: Never  Vaping Use   Vaping Use: Never used  Substance and Sexual Activity   Alcohol use: Not Currently   Drug use: Never   Sexual activity: Not Currently  Other Topics Concern   Not on file  Social History Narrative   Not on file   Social Determinants of Health   Financial Resource Strain: Low Risk    Difficulty of Paying Living Expenses: Not hard at all  Food Insecurity: No Food Insecurity   Worried About Programme researcher, broadcasting/film/video in the Last Year: Never true   Ran Out of Food in the Last Year: Never true  Transportation Needs: No Transportation Needs   Lack of Transportation (Medical): No   Lack of Transportation (Non-Medical): No  Physical Activity: Not on file  Stress: Not on file  Social Connections: Not on file  Intimate Partner Violence: Not on file      Levert Feinstein, M.D. Ph.D.  [facility] [address] [place], [place] [address] Ph: [phone] Fax: [phone]  CC:  Osvaldo Shipper, MD [address] [place],  [place] [address]  Janeece Agee, NP

## 2021-04-25 ENCOUNTER — Ambulatory Visit: Payer: Medicare Other | Admitting: Internal Medicine

## 2021-04-25 ENCOUNTER — Telehealth: Payer: Self-pay

## 2021-04-25 ENCOUNTER — Other Ambulatory Visit: Payer: Self-pay

## 2021-04-25 ENCOUNTER — Encounter: Payer: Self-pay | Admitting: Internal Medicine

## 2021-04-25 VITALS — BP 116/72 | HR 80 | Ht 68.0 in | Wt 241.0 lb

## 2021-04-25 DIAGNOSIS — I2510 Atherosclerotic heart disease of native coronary artery without angina pectoris: Secondary | ICD-10-CM

## 2021-04-25 DIAGNOSIS — Z8673 Personal history of transient ischemic attack (TIA), and cerebral infarction without residual deficits: Secondary | ICD-10-CM

## 2021-04-25 NOTE — Telephone Encounter (Signed)
Does not appear Pt has worn heart monitor. ? ?Will attempt to get ILR precerted-may require a heart monitor before insurance will cover. ? ?Will follow. ?

## 2021-04-25 NOTE — Patient Instructions (Addendum)
Medication Instructions:  ?Your physician recommends that you continue on your current medications as directed. Please refer to the Current Medication list given to you today. ? ?*If you need a refill on your cardiac medications before your next appointment, please call your pharmacy* ? ? ?Lab Work: ?IN 2 MONTHS: Fasting lipid panel, ALT, Lipoprotein a ?If you have labs (blood work) drawn today and your tests are completely normal, you will receive your results only by: ?MyChart Message (if you have MyChart) OR ?A paper copy in the mail ?If you have any lab test that is abnormal or we need to change your treatment, we will call you to review the results. ? ? ?Testing/Procedures: ?NONE ? ? ?Follow-Up: ?At [facility], you and your health needs are our priority.  As part of our continuing mission to provide you with exceptional heart care, we have created designated Provider Care Teams.  These Care Teams include your primary Cardiologist (physician) and Advanced Practice Providers (APPs -  Physician Assistants and Nurse Practitioners) who all work together to provide you with the care you need, when you need it. ? ?We recommend signing up for the patient portal called "MyChart".  Sign up information is provided on this After Visit Summary.  MyChart is used to connect with patients for Virtual Visits (Telemedicine).  Patients are able to view lab/test results, encounter notes, upcoming appointments, etc.  Non-urgent messages can be sent to your provider as well.   ?To learn more about what you can do with MyChart, go to [email or web address].   ? ?Your next appointment:   ?4 month(s) ? ?The format for your next appointment:   ?In Person ? ?Provider:   ?Werner Lean, MD   ? ?

## 2021-04-28 NOTE — Telephone Encounter (Signed)
Pt has been approved for loop implant. ? ?Message sent to scheduling. ?

## 2021-04-29 ENCOUNTER — Ambulatory Visit: Payer: Medicare Other | Admitting: Physical Therapy

## 2021-04-29 ENCOUNTER — Ambulatory Visit: Payer: Medicare Other | Admitting: Speech Pathology

## 2021-04-29 ENCOUNTER — Ambulatory Visit: Payer: Medicare Other | Admitting: Occupational Therapy

## 2021-05-01 ENCOUNTER — Ambulatory Visit: Payer: Medicare Other | Admitting: Speech Pathology

## 2021-05-01 ENCOUNTER — Ambulatory Visit: Payer: Medicare Other | Admitting: Occupational Therapy

## 2021-05-01 ENCOUNTER — Ambulatory Visit: Payer: Medicare Other | Admitting: Physical Therapy

## 2021-05-06 ENCOUNTER — Ambulatory Visit: Payer: Medicare Other | Admitting: Podiatry

## 2021-05-06 ENCOUNTER — Encounter: Payer: Self-pay | Admitting: Podiatry

## 2021-05-06 DIAGNOSIS — L97512 Non-pressure chronic ulcer of other part of right foot with fat layer exposed: Secondary | ICD-10-CM | POA: Diagnosis not present

## 2021-05-06 DIAGNOSIS — E11621 Type 2 diabetes mellitus with foot ulcer: Secondary | ICD-10-CM

## 2021-05-06 DIAGNOSIS — L97511 Non-pressure chronic ulcer of other part of right foot limited to breakdown of skin: Secondary | ICD-10-CM | POA: Diagnosis not present

## 2021-05-06 NOTE — Progress Notes (Signed)
?  Subjective:  ?Patient ID: Joe Stewart, male    DOB: 13-May-1955,   MRN: [id number] ? ?No chief complaint on file. ? ? ?66 y.o. male presents for folow-up of right foot wound. Relates it seems to be getting better. Relates he has changed his shoes to try and keep pressure off the area but continued to not wear surgical shoe as he cannot tolerate.  No pain He is diabetic and last A1c was 12.3  . Denies any other pedal complaints. Denies n/v/f/c.  ? ?PCP: [name] MD  ? ?Past Medical History:  ?Diagnosis Date  ? Arthritis   ? Cataract   ? Mixed OU  ? Diabetes mellitus without complication ([place])   ? Diabetic retinopathy ([place])   ? NPDR OU  ? Hyperlipidemia   ? Hypertension   ? Hypertensive retinopathy   ? OU  ? Obesity   ? ? ?Objective:  ?Physical Exam: ?Vascular: DP/PT pulses 2/4 bilateral. CFT <3 seconds. Normal hair growth on digits. No edema.  ?Skin. No lacerations or abrasions bilateral feet. Right fifth lateral digit with wound noted measuring about 0.5 cm x 0.5 cm x 0.2 cm with granular base. Undelrying hyperkeratotic tissue No erythema edema or purulence noted.  ?Musculoskeletal: MMT 5/5 bilateral lower extremities in DF, PF, Inversion and Eversion. Deceased ROM in DF of ankle joint.  ?Neurological: Sensation intact to light touch.  ? ?Assessment:  ? ?1. Diabetic ulcer of toe of right foot associated with type 2 diabetes mellitus, with fat layer exposed ([place])   ? ? ? ? ? ?Plan:  ?Patient was evaluated and treated and all questions answered. ?Ulcer right fifth toe limited to breakdown of skin  ?-Debridement as below. ?-Dressed with betadine, DSD. ?-Off-loading with surgical shoe.  Discussed wearing surgical shoe again as this will speed the healing process. Discussed wearing any shoe that does not cause rubbing in the fifth toe.  ?-No abx indicated.  ?-Discussed glucose control and proper protein-rich diet.  ?-Discussed if any worsening redness, pain, fever or chills to call or may need to report to the  emergency room. Patient expressed understanding.  ? ?Procedure: Excisional Debridement of Wound ?Rationale: Removal of non-viable soft tissue from the wound to promote healing.  ?Anesthesia: none ?Pre-Debridement Wound Measurements: Overlying callus  ?Post-Debridement Wound Measurements: 0.5 cm x 0.5 cm x 0.1 cm  ?Type of Debridement: Sharp Excisional ?Tissue Removed: Non-viable soft tissue ?Depth of Debridement: subcutaneous tissue. ?Technique: Sharp excisional debridement to bleeding, viable wound base.  ?Dressing: Dry, sterile, compression dressing. ?Disposition: Patient tolerated procedure well. Patient to return in 2 week for follow-up. ? ?No follow-ups on file. ? ?No follow-ups on file. ? ? ?Lorenda Peck, DPM  ? ? ?

## 2021-05-07 ENCOUNTER — Inpatient Hospital Stay: Payer: Self-pay | Admitting: Neurology

## 2021-05-09 ENCOUNTER — Encounter: Payer: Self-pay | Admitting: Internal Medicine

## 2021-05-09 ENCOUNTER — Ambulatory Visit: Payer: Medicare Other | Admitting: Internal Medicine

## 2021-05-09 DIAGNOSIS — I639 Cerebral infarction, unspecified: Secondary | ICD-10-CM | POA: Insufficient documentation

## 2021-05-09 NOTE — Patient Instructions (Signed)
Medication Instructions:  ?Your physician recommends that you continue on your current medications as directed. Please refer to the Current Medication list given to you today. ? ?Labwork: ?None ordered. ? ?Testing/Procedures: ?None ordered. ? ?Follow-Up: ? ?Your physician wants you to follow-up in: as needed ? ?Implantable Loop Recorder Placement, Care After ?This sheet gives you information about how to care for yourself after your procedure. Your health care provider may also give you more specific instructions. If you have problems or questions, contact your health care provider. ?What can I expect after the procedure? ?After the procedure, it is common to have: ?Soreness or discomfort near the incision. ?Some swelling or bruising near the incision. ? ?Follow these instructions at home: ?Incision care ? ? Leave your outer dressing on for 72 hours.  After 72 hours you can remove your outer dressing and shower. ?Leave adhesive strips in place. These skin closures may need to stay in place for 1-2 weeks. If adhesive strip edges start to loosen and curl up, you may trim the loose edges.  You may remove the strips if they have not fallen off after 2 weeks. ?Check your incision area every day for signs of infection. Check for: ?Redness, swelling, or pain. ?Fluid or blood. ?Warmth. ?Pus or a bad smell. ?Do not take baths, swim, or use a hot tub until your incision is completely healed. ?If your wound site starts to bleed apply pressure.   ?   ?If you have any questions/concerns please call the device clinic at [phone]. ? ?Activity ? ?Return to your normal activities. ? ?General instructions ?Follow instructions from your health care provider about how to manage your implantable loop recorder and transmit the information. Learn how to activate a recording if this is necessary for your type of device. ?You may go through a metal detection gate, and you may let someone hold a metal detector over your chest. Show your  ID card if needed. ?Do not have an MRI unless you check with your health care provider first. ?Take over-the-counter and prescription medicines only as told by your health care provider. ?Keep all follow-up visits as told by your health care provider. This is important. ?Contact a health care provider if: ?You have redness, swelling, or pain around your incision. ?You have a fever. ?You have pain that is not relieved by your pain medicine. ?You have triggered your device because of fainting (syncope) or because of a heartbeat that feels like it is racing, slow, fluttering, or skipping (palpitations). ?Get help right away if you have: ?Chest pain. ?Difficulty breathing. ?Summary ?After the procedure, it is common to have soreness or discomfort near the incision. ?Change your dressing as told by your health care provider. ?Follow instructions from your health care provider about how to manage your implantable loop recorder and transmit the information. ?Keep all follow-up visits as told by your health care provider. This is important. ?This information is not intended to replace advice given to you by your health care provider. Make sure you discuss any questions you have with your health care provider. ?Document Released: 12/31/2014 Document Revised: 03/06/2017 Document Reviewed: 03/06/2017 ?Elsevier Patient Education ? [place]. ? ?

## 2021-05-10 NOTE — Progress Notes (Signed)
? ? ? ? ?HPI ?Mr. Capraro is referred today by Dr. Suzanne Boron for evaluation of cryptogenic stroke. He is a pleasant 66 yo man with a h/o recurrent stroke, DM, HTN, and obesity. The patient had a left PCA stroke most recently and has improved. He denies a h/o atrial fib and does not feel palitations. He denies chest pain or sob. He has known non-obstructive CAD with ectasia. The findings were thought to be due at least in part to endothelial dysfunction/microvascular CAD.  ? ?No Known Allergies ? ? ?Current Outpatient Medications  ?Medication Sig Dispense Refill  ? amLODipine (NORVASC) 10 MG tablet Take 1 tablet (10 mg total) by mouth daily. 30 tablet 2  ? aspirin EC 81 MG tablet Take 1 tablet (81 mg total) by mouth daily. Swallow whole. 30 tablet 11  ? atorvastatin (LIPITOR) 80 MG tablet Take 1 tablet (80 mg total) by mouth daily. 30 tablet 2  ? blood glucose meter kit and supplies KIT Dispense based on patient and insurance preference. Use up to four times daily as directed. 1 each 0  ? clonazePAM (KLONOPIN) 0.25 MG disintegrating tablet Take 1 tablet (0.25 mg total) by mouth 2 (two) times daily. (Patient taking differently: Take 0.25 mg by mouth as needed.) 60 tablet 0  ? Continuous Blood Gluc Sensor (DEXCOM G6 SENSOR) MISC Apply one every 10 days per package instructions 9 each 1  ? Continuous Blood Gluc Transmit (DEXCOM G6 TRANSMITTER) MISC 1 each by Does not apply route every 3 (three) months. 1 each 1  ? empagliflozin (JARDIANCE) 25 MG TABS tablet Take 1 tablet (25 mg total) by mouth daily before breakfast. 90 tablet 0  ? hydrochlorothiazide (HYDRODIURIL) 12.5 MG tablet Take 1 tablet (12.5 mg total) by mouth daily. 90 tablet 0  ? losartan (COZAAR) 50 MG tablet Take 1 tablet (50 mg total) by mouth daily at 10 pm. 90 tablet 0  ? metoprolol succinate (TOPROL-XL) 50 MG 24 hr tablet Take 1 tablet (50 mg total) by mouth daily. Take with or immediately following a meal. 90 tablet 3  ? ?No current facility-administered  medications for this visit.  ? ? ? ?Past Medical History:  ?Diagnosis Date  ? Arthritis   ? Cataract   ? Mixed OU  ? Diabetes mellitus without complication ([place])   ? Diabetic retinopathy ([place])   ? NPDR OU  ? Hyperlipidemia   ? Hypertension   ? Hypertensive retinopathy   ? OU  ? Obesity   ? ? ?ROS: ? ? All systems reviewed and negative except as noted in the HPI. ? ? ?Past Surgical History:  ?Procedure Laterality Date  ? BIOPSY  11/29/2019  ? Procedure: BIOPSY;  Surgeon: Irving Copas., MD;  Location: [facility];  Service: Gastroenterology;;  ? COLONOSCOPY WITH PROPOFOL N/A 11/29/2019  ? Procedure: COLONOSCOPY WITH PROPOFOL;  Surgeon: Mansouraty, Telford Nab., MD;  Location: [facility];  Service: Gastroenterology;  Laterality: N/A;  ? ENDOSCOPIC MUCOSAL RESECTION N/A 11/29/2019  ? Procedure: ENDOSCOPIC MUCOSAL RESECTION;  Surgeon: Rush Landmark Telford Nab., MD;  Location: [facility];  Service: Gastroenterology;  Laterality: N/A;  ? ESOPHAGOGASTRODUODENOSCOPY (EGD) WITH PROPOFOL N/A 11/29/2019  ? Procedure: ESOPHAGOGASTRODUODENOSCOPY (EGD) WITH PROPOFOL;  Surgeon: Rush Landmark Telford Nab., MD;  Location: [facility];  Service: Gastroenterology;  Laterality: N/A;  ? EYE SURGERY Left   ? had to have eye flushed after getting costic sodium in L eye  ? HEMOSTASIS CLIP PLACEMENT  11/29/2019  ? Procedure: HEMOSTASIS CLIP PLACEMENT;  Surgeon: Justice Britain  Brooke Bonito., MD;  Location: [facility];  Service: Gastroenterology;;  ? HERNIA REPAIR    ? HERNIA REPAIR    ? 35 years ago  ? HOT HEMOSTASIS N/A 11/29/2019  ? Procedure: HOT HEMOSTASIS (ARGON PLASMA COAGULATION/BICAP);  Surgeon: Irving Copas., MD;  Location: [facility];  Service: Gastroenterology;  Laterality: N/A;  ? LEFT HEART CATH AND CORONARY ANGIOGRAPHY N/A 04/11/2019  ? Procedure: LEFT HEART CATH AND CORONARY ANGIOGRAPHY;  Surgeon: Adrian Prows, MD;  Location: [facility];  Service: Cardiovascular;  Laterality: N/A;  ? POLYPECTOMY   11/29/2019  ? Procedure: POLYPECTOMY;  Surgeon: Irving Copas., MD;  Location: [facility];  Service: Gastroenterology;;  ? RENAL ANGIOGRAPHY Bilateral 04/11/2019  ? Procedure: RENAL ANGIOGRAPHY;  Surgeon: Adrian Prows, MD;  Location: [facility];  Service: Cardiovascular;  Laterality: Bilateral;  ? SUBMUCOSAL LIFTING INJECTION  11/29/2019  ? Procedure: SUBMUCOSAL LIFTING INJECTION;  Surgeon: Irving Copas., MD;  Location: [facility];  Service: Gastroenterology;;  ? TOTAL KNEE ARTHROPLASTY Left 08/01/2019  ? Procedure: LEFT TOTAL KNEE ARTHROPLASTY;  Surgeon: Meredith Pel, MD;  Location: [facility];  Service: Orthopedics;  Laterality: Left;  ? ? ? ?Family History  ?Problem Relation Age of Onset  ? Diabetes Mother   ? COPD Mother   ? Cancer Mother   ?     unknown type   ? Heart disease Father   ? COPD Sister   ? Healthy Brother   ? Heart disease Brother   ? Healthy Son   ? Colon cancer Neg Hx   ? Colon polyps Neg Hx   ? Esophageal cancer Neg Hx   ? Rectal cancer Neg Hx   ? Stomach cancer Neg Hx   ? Inflammatory bowel disease Neg Hx   ? Liver disease Neg Hx   ? Pancreatic cancer Neg Hx   ? ? ? ?Social History  ? ?Socioeconomic History  ? Marital status: Single  ?  Spouse name: Not on file  ? Number of children: 1  ? Years of education: Not on file  ? Highest education level: Not on file  ?Occupational History  ? Not on file  ?Tobacco Use  ? Smoking status: Never  ? Smokeless tobacco: Never  ?Vaping Use  ? Vaping Use: Never used  ?Substance and Sexual Activity  ? Alcohol use: Not Currently  ? Drug use: Never  ? Sexual activity: Not Currently  ?Other Topics Concern  ? Not on file  ?Social History Narrative  ? Not on file  ? ?Social Determinants of Health  ? ?Financial Resource Strain: Low Risk   ? Difficulty of Paying Living Expenses: Not hard at all  ?Food Insecurity: No Food Insecurity  ? Worried About Charity fundraiser in the Last Year: Never true  ? Ran Out of Food in the Last Year: Never  true  ?Transportation Needs: No Transportation Needs  ? Lack of Transportation (Medical): No  ? Lack of Transportation (Non-Medical): No  ?Physical Activity: Not on file  ?Stress: Not on file  ?Social Connections: Not on file  ?Intimate Partner Violence: Not on file  ? ? ? ?BP 128/72   Pulse 75   Ht _0  (1.727 m)   Wt 241 lb 12.8 oz (109.7 kg)   SpO2 97%   BMI 36.77 kg/m?  ? ?Physical Exam: ? ?Well appearing NAD ?HEENT: Unremarkable ?Neck:  No JVD, no thyromegally ?Lymphatics:  No adenopathy ?Back:  No CVA tenderness ?Lungs:  Clear with  no wheezes ?HEART:  Regular rate rhythm, no murmurs, no rubs, no clicks ?Abd:  soft, positive bowel sounds, no organomegally, no rebound, no guarding ?Ext:  2 plus pulses, no edema, no cyanosis, no clubbing ?Skin:  No rashes no nodules ?Neuro:  CN II through XII intact, motor grossly intact ? ?EKG- nsr with RBBB ? ?Assess/Plan:  ?Cryptogenic stroke - I have discussed the treatment options with the patient and recommended proceeding with ILR insertion as he is at risk for atrial fib.  ?HTN - his bp is well controlled. No change in meds. ?Dyslipidemia - he will continue his statin therapy. ?Obesity - he is encouraged to lose weight. ? ?Salome Spotted ? ?EP Procedure Note ? ?Procedure Performed: ILR insertion ? ?Preoperative diagnosis: cryptogenic stroke ? ?Postoperative diagnosis: same as preoperative diagnosis ? ?Description of the procedure: after informed consent was obtained, the patient was prepped and draped in a sterile fashion. 10 cc of lidocaine was infiltrated into the left pectoral region. A one cm stab incision was carried out. The Medtronic ILR, [id number] was inserted. The R waves measured 0.2 mV. Benzoin and steristrips were painted on the skin and a bandage applied and the patient recovered in the usual manner. ?Complications: none immediately ? ?Conclusion: successful ILR insertion. ? ? ?Carleene Overlie Briell Paulette,MD ?

## 2021-05-19 ENCOUNTER — Other Ambulatory Visit: Payer: Self-pay | Admitting: Cardiology

## 2021-05-19 ENCOUNTER — Other Ambulatory Visit: Payer: Self-pay | Admitting: Registered Nurse

## 2021-05-19 DIAGNOSIS — I10 Essential (primary) hypertension: Secondary | ICD-10-CM

## 2021-05-19 DIAGNOSIS — E119 Type 2 diabetes mellitus without complications: Secondary | ICD-10-CM

## 2021-05-19 DIAGNOSIS — I639 Cerebral infarction, unspecified: Secondary | ICD-10-CM

## 2021-05-21 ENCOUNTER — Encounter: Payer: Self-pay | Admitting: Podiatry

## 2021-05-21 ENCOUNTER — Ambulatory Visit: Payer: Medicare Other | Admitting: Podiatry

## 2021-05-21 DIAGNOSIS — E11621 Type 2 diabetes mellitus with foot ulcer: Secondary | ICD-10-CM | POA: Diagnosis not present

## 2021-05-21 DIAGNOSIS — L97511 Non-pressure chronic ulcer of other part of right foot limited to breakdown of skin: Secondary | ICD-10-CM

## 2021-05-21 NOTE — Progress Notes (Signed)
?  Subjective:  ?Patient ID: Joe Stewart, male    DOB: July 16, 1955,   MRN: [id number] ? ?Chief Complaint  ?Patient presents with  ? Foot Ulcer  ?  The right 5th toe is doing better and has healed up and the shoe did help  ? ? ?66 y.o. male presents for folow-up of right foot wound. Relates it seems to be getting better. Relates he has changed his shoes to try and keep pressure off the area but continued to not wear surgical shoe as he cannot tolerate.  No pain He is diabetic and last A1c was 12.3  . Denies any other pedal complaints. Denies n/v/f/c.  ? ?PCP: [name] MD  ? ?Past Medical History:  ?Diagnosis Date  ? Arthritis   ? Cataract   ? Mixed OU  ? Diabetes mellitus without complication ([place])   ? Diabetic retinopathy ([place])   ? NPDR OU  ? Hyperlipidemia   ? Hypertension   ? Hypertensive retinopathy   ? OU  ? Obesity   ? ? ?Objective:  ?Physical Exam: ?Vascular: DP/PT pulses 2/4 bilateral. CFT <3 seconds. Normal hair growth on digits. No edema.  ?Skin. No lacerations or abrasions bilateral feet. Right fifth lateral digit with wound noted measuring about 0.2 cm x 0.4 cm x 0.2 cm with granular base. Undelrying hyperkeratotic tissue No erythema edema or purulence noted.  ?Musculoskeletal: MMT 5/5 bilateral lower extremities in DF, PF, Inversion and Eversion. Deceased ROM in DF of ankle joint.  ?Neurological: Sensation intact to light touch.  ? ?Assessment:  ? ?1. Diabetic ulcer of toe of right foot associated with type 2 diabetes mellitus, limited to breakdown of skin ([place])   ? ? ? ? ? ?Plan:  ?Patient was evaluated and treated and all questions answered. ?Ulcer right fifth toe limited to breakdown of skin  ?-Debridement as below. ?-Dressed with betadine, DSD. ?-Off-loading with surgical shoe.  Discussed wearing surgical shoe again as this will speed the healing process. Discussed wearing any shoe that does not cause rubbing in the fifth toe.  ?-No abx indicated.  ?-Discussed glucose control and proper  protein-rich diet.  ?-Discussed if any worsening redness, pain, fever or chills to call or may need to report to the emergency room. Patient expressed understanding.  ? ?Procedure: Excisional Debridement of Wound ?Rationale: Removal of non-viable soft tissue from the wound to promote healing.  ?Anesthesia: none ?Pre-Debridement Wound Measurements: Overlying callus  ?Post-Debridement Wound Measurements: 0.2 cm x 0.4 cm x 0.1 cm  ?Type of Debridement: Sharp Excisional ?Tissue Removed: Non-viable soft tissue ?Depth of Debridement: subcutaneous tissue. ?Technique: Sharp excisional debridement to bleeding, viable wound base.  ?Dressing: Dry, sterile, compression dressing. ?Disposition: Patient tolerated procedure well. Patient to return in 3 week for follow-up. ? ?Return in about 3 weeks (around 06/11/2021) for wound check. ? ?Return in about 3 weeks (around 06/11/2021) for wound check. ? ? ?Lorenda Peck, DPM  ? ? ?

## 2021-05-21 NOTE — Progress Notes (Addendum)
?[facility] Note ? ?05/26/2021 ? ?  ? ?CHIEF COMPLAINT ?Patient presents for Retina Follow Up ? ? ?HISTORY OF PRESENT ILLNESS: ?Joe Stewart is a 66 y.o. male who presents to the clinic today for:  ?HPI   ? ? Retina Follow Up   ?Patient presents with  Diabetic Retinopathy.  In both eyes.  Severity is moderate.  Duration of 9 weeks.  Since onset it is stable.  I, the attending physician,  performed the HPI with the patient and updated documentation appropriately. ? ?  ?  ? ? Comments   ?Pt here for 9 wk ret f/u for NPDR OU. Pt states VA is stable, about the same. No changes or issues.  ? ?  ?  ?Last edited by Bernarda Caffey, MD on 05/26/2021 12:52 PM.  ?  ? ?Pt states vision has not changes, no new issues concerning the stroke he had in January, he is just on aspirin for a blood thinner ? ?Referring physician: ?Shirleen Schirmer, [place] ?[address] ?[place], [place] [address] ? ? ?HISTORICAL INFORMATION:  ? ?Selected notes from the [facility] ?Referred by Deeann Saint, PA ?LEE: 09.15.21 BCVA OD: 20/20 OS: 20/20 ?Ocular Hx- NPDR with edema  ?PMH- DM, HTN  ? ?CURRENT MEDICATIONS: ?No current outpatient medications on file. (Ophthalmic Drugs)  ? ?No current facility-administered medications for this visit. (Ophthalmic Drugs)  ? ?Current Outpatient Medications (Other)  ?Medication Sig  ? amLODipine (NORVASC) 10 MG tablet Take 1 tablet (10 mg total) by mouth daily.  ? aspirin EC 81 MG tablet Take 1 tablet (81 mg total) by mouth daily. Swallow whole.  ? atorvastatin (LIPITOR) 80 MG tablet Take 1 tablet (80 mg total) by mouth daily.  ? blood glucose meter kit and supplies KIT Dispense based on patient and insurance preference. Use up to four times daily as directed.  ? clonazePAM (KLONOPIN) 0.25 MG disintegrating tablet Take 1 tablet (0.25 mg total) by mouth 2 (two) times daily. (Patient taking differently: Take 0.25 mg by mouth as needed.)  ? Continuous Blood Gluc Sensor (DEXCOM G6  SENSOR) MISC Apply one every 10 days per package instructions  ? Continuous Blood Gluc Transmit (DEXCOM G6 TRANSMITTER) MISC 1 each by Does not apply route every 3 (three) months.  ? hydrochlorothiazide (HYDRODIURIL) 12.5 MG tablet TAKE 1 TABLET BY MOUTH EVERY DAY  ? JARDIANCE 25 MG TABS tablet TAKE 1 TABLET BY MOUTH DAILY BEFORE BREAKFAST.  ? losartan (COZAAR) 50 MG tablet Take 1 tablet (50 mg total) by mouth daily at 10 pm.  ? metoprolol succinate (TOPROL-XL) 50 MG 24 hr tablet Take 1 tablet (50 mg total) by mouth daily. Take with or immediately following a meal.  ? ?No current facility-administered medications for this visit. (Other)  ? ?REVIEW OF SYSTEMS: ?ROS   ?Positive for: Gastrointestinal, Neurological, Musculoskeletal, Endocrine, Eyes ?Negative for: Constitutional, Skin, Genitourinary, HENT, Cardiovascular, Respiratory, Psychiatric, Allergic/Imm, Heme/Lymph ?Last edited by Kingsley Spittle, COT on 05/26/2021  9:48 AM.  ?  ? ?ALLERGIES ?No Known Allergies ? ?PAST MEDICAL HISTORY ?Past Medical History:  ?Diagnosis Date  ? Arthritis   ? Cataract   ? Mixed OU  ? Diabetes mellitus without complication ([place])   ? Diabetic retinopathy ([place])   ? NPDR OU  ? Hyperlipidemia   ? Hypertension   ? Hypertensive retinopathy   ? OU  ? Obesity   ? Stroke [facility])   ? ?Past Surgical History:  ?Procedure Laterality  Date  ? BIOPSY  11/29/2019  ? Procedure: BIOPSY;  Surgeon: Irving Copas., MD;  Location: [facility];  Service: Gastroenterology;;  ? COLONOSCOPY WITH PROPOFOL N/A 11/29/2019  ? Procedure: COLONOSCOPY WITH PROPOFOL;  Surgeon: Mansouraty, Telford Nab., MD;  Location: [facility];  Service: Gastroenterology;  Laterality: N/A;  ? ENDOSCOPIC MUCOSAL RESECTION N/A 11/29/2019  ? Procedure: ENDOSCOPIC MUCOSAL RESECTION;  Surgeon: Rush Landmark Telford Nab., MD;  Location: [facility];  Service: Gastroenterology;  Laterality: N/A;  ? ESOPHAGOGASTRODUODENOSCOPY (EGD) WITH PROPOFOL N/A 11/29/2019  ? Procedure:  ESOPHAGOGASTRODUODENOSCOPY (EGD) WITH PROPOFOL;  Surgeon: Rush Landmark Telford Nab., MD;  Location: [facility];  Service: Gastroenterology;  Laterality: N/A;  ? EYE SURGERY Left   ? had to have eye flushed after getting costic sodium in L eye  ? HEMOSTASIS CLIP PLACEMENT  11/29/2019  ? Procedure: HEMOSTASIS CLIP PLACEMENT;  Surgeon: Irving Copas., MD;  Location: [facility];  Service: Gastroenterology;;  ? HERNIA REPAIR    ? HERNIA REPAIR    ? 35 years ago  ? HOT HEMOSTASIS N/A 11/29/2019  ? Procedure: HOT HEMOSTASIS (ARGON PLASMA COAGULATION/BICAP);  Surgeon: Irving Copas., MD;  Location: [facility];  Service: Gastroenterology;  Laterality: N/A;  ? LEFT HEART CATH AND CORONARY ANGIOGRAPHY N/A 04/11/2019  ? Procedure: LEFT HEART CATH AND CORONARY ANGIOGRAPHY;  Surgeon: Adrian Prows, MD;  Location: [facility];  Service: Cardiovascular;  Laterality: N/A;  ? POLYPECTOMY  11/29/2019  ? Procedure: POLYPECTOMY;  Surgeon: Irving Copas., MD;  Location: [facility];  Service: Gastroenterology;;  ? RENAL ANGIOGRAPHY Bilateral 04/11/2019  ? Procedure: RENAL ANGIOGRAPHY;  Surgeon: Adrian Prows, MD;  Location: [facility];  Service: Cardiovascular;  Laterality: Bilateral;  ? SUBMUCOSAL LIFTING INJECTION  11/29/2019  ? Procedure: SUBMUCOSAL LIFTING INJECTION;  Surgeon: Irving Copas., MD;  Location: [facility];  Service: Gastroenterology;;  ? TOTAL KNEE ARTHROPLASTY Left 08/01/2019  ? Procedure: LEFT TOTAL KNEE ARTHROPLASTY;  Surgeon: Meredith Pel, MD;  Location: [place] [facility];  Service: Orthopedics;  Laterality: Left;  ? ?FAMILY HISTORY ?Family History  ?Problem Relation Age of Onset  ? Diabetes Mother   ? COPD Mother   ? Cancer Mother   ?     unknown type   ? Heart disease Father   ? COPD Sister   ? Healthy Brother   ? Heart disease Brother   ? Healthy Son   ? Colon cancer Neg Hx   ? Colon polyps Neg Hx   ? Esophageal cancer Neg Hx   ? Rectal cancer Neg Hx   ? Stomach cancer Neg  Hx   ? Inflammatory bowel disease Neg Hx   ? Liver disease Neg Hx   ? Pancreatic cancer Neg Hx   ? ?SOCIAL HISTORY ?Social History  ? ?Tobacco Use  ? Smoking status: Never  ? Smokeless tobacco: Never  ?Vaping Use  ? Vaping Use: Never used  ?Substance Use Topics  ? Alcohol use: Not Currently  ? Drug use: Never  ?  ? ?  ?OPHTHALMIC EXAM: ? ?Base Eye Exam   ? ? Visual Acuity (Snellen - Linear)   ? ?   Right Left  ? Dist [place] 20/30 20/40 -1  ? Dist ph  20/25 20/30 -1  ? ?  ?  ? ? Tonometry (Tonopen, 9:55 AM)   ? ?   Right Left  ? Pressure 17 16  ? ?  ?  ? ? Pupils   ? ?   Dark Light Shape  React APD  ? Right 3 2 Round Brisk None  ? Left 3 2 Round Brisk None  ? ?  ?  ? ? Visual Fields (Counting fingers)   ? ?   Left Right  ? Restrictions Total superior nasal deficiency Total superior temporal deficiency  ? ?  ?  ? ? Extraocular Movement   ? ?   Right Left  ?  Full, Ortho Full, Ortho  ? ?  ?  ? ? Neuro/Psych   ? ? Oriented x3: Yes  ? Mood/Affect: Normal  ? ?  ?  ? ? Dilation   ? ? Both eyes: 1.0% Mydriacyl, 2.5% Phenylephrine @ 9:56 AM  ? ?  ?  ? ?  ? ?Slit Lamp and Fundus Exam   ? ? Slit Lamp Exam   ? ?   Right Left  ? Lids/Lashes Dermatochalasis - upper lid, Meibomian gland dysfunction Dermatochalasis - upper lid, Meibomian gland dysfunction  ? Conjunctiva/Sclera White and quiet White and quiet  ? Cornea 2+ inferior Punctate epithelial erosions, mild tear film debris Mild tear film debris, 1-2+ Punctate epithelial erosions  ? Anterior Chamber Deep and quiet, deep, clear, narrow temporal angle Deep and quiet  ? Iris Round and dilated, No NVI Round and moderately dilated to 6.86m, No NVI, PPM nasal  ? Lens 2-3+ Nuclear sclerosis, 2-3+ Cortical cataract, +Vacuoles 2+ Nuclear sclerosis, 2-3+ Cortical cataract, +Vacuoles  ? Anterior Vitreous Vitreous syneresis Vitreous syneresis  ? ?  ?  ? ? Fundus Exam   ? ?   Right Left  ? Disc Pink and Sharp, PPA/PPP Pink and Sharp, +Peripapillary atrophy, Compact  ? C/D Ratio 0.2 0.1  ?  Macula Flat, Blunted foveal reflex, scattered MA/IRH, RPE mottling and clumping, trace cystic changes - improved Flat, Blunted foveal reflex, central cystic changes, rare MA, focal cluster of exudates a and

## 2021-05-23 ENCOUNTER — Other Ambulatory Visit: Payer: Self-pay

## 2021-05-23 ENCOUNTER — Telehealth: Payer: Self-pay

## 2021-05-23 MED ORDER — AMLODIPINE BESYLATE 10 MG PO TABS: 10.0000 mg | ORAL_TABLET | Freq: Every day | ORAL | 2 refills | Status: DC

## 2021-05-23 MED ORDER — ATORVASTATIN CALCIUM 80 MG PO TABS
80.0000 mg | ORAL_TABLET | Freq: Every day | ORAL | 2 refills | Status: DC
Start: 2021-05-23 — End: 2021-08-15

## 2021-05-23 NOTE — Telephone Encounter (Signed)
Medications have been sent the pharmacy ?

## 2021-05-23 NOTE — Telephone Encounter (Signed)
Pt would also like a refill on the Atorvastatin  ? ?Please advise  ? ?Pt is almost out of this medication, this was given to him while in the hospital back in Jan.  ? ?  ?

## 2021-05-23 NOTE — Telephone Encounter (Signed)
MEDICATION: amLODipine (NORVASC) 10 MG tablet // atorvastatin (LIPITOR) 80 MG tablet ? ?PHARMACY: [id number] [place], [place] - [address]? ?Comments: The medication was prescribed by the doctor at the hospital  ? ?**Let patient know to contact pharmacy at the end of the day to make sure medication is ready. ** ? ?** Please notify patient to allow 48-72 hours to process** ? ?**Encourage patient to contact the pharmacy for refills or they can request refills through [facility]* ? ? ?

## 2021-05-26 ENCOUNTER — Encounter (INDEPENDENT_AMBULATORY_CARE_PROVIDER_SITE_OTHER): Payer: Self-pay | Admitting: Ophthalmology

## 2021-05-26 ENCOUNTER — Ambulatory Visit (INDEPENDENT_AMBULATORY_CARE_PROVIDER_SITE_OTHER): Payer: Medicare Other | Admitting: Ophthalmology

## 2021-05-26 DIAGNOSIS — I10 Essential (primary) hypertension: Secondary | ICD-10-CM | POA: Diagnosis not present

## 2021-05-26 DIAGNOSIS — H547 Unspecified visual loss: Secondary | ICD-10-CM | POA: Diagnosis not present

## 2021-05-26 DIAGNOSIS — H35033 Hypertensive retinopathy, bilateral: Secondary | ICD-10-CM | POA: Diagnosis not present

## 2021-05-26 DIAGNOSIS — E113313 Type 2 diabetes mellitus with moderate nonproliferative diabetic retinopathy with macular edema, bilateral: Secondary | ICD-10-CM | POA: Diagnosis not present

## 2021-05-26 DIAGNOSIS — H25813 Combined forms of age-related cataract, bilateral: Secondary | ICD-10-CM

## 2021-05-26 DIAGNOSIS — I639 Cerebral infarction, unspecified: Secondary | ICD-10-CM

## 2021-05-28 ENCOUNTER — Ambulatory Visit (INDEPENDENT_AMBULATORY_CARE_PROVIDER_SITE_OTHER): Payer: Medicare Other

## 2021-05-28 DIAGNOSIS — Z1211 Encounter for screening for malignant neoplasm of colon: Secondary | ICD-10-CM | POA: Diagnosis not present

## 2021-05-28 DIAGNOSIS — Z0000 Encounter for general adult medical examination without abnormal findings: Secondary | ICD-10-CM | POA: Diagnosis not present

## 2021-05-28 NOTE — Progress Notes (Signed)
? ?Subjective:  ? Joe Stewart is a 66 y.o. male who presents for an Initial Medicare Annual Wellness Visit. ? ?Review of Systems    ? ?Cardiac Risk Factors include: advanced age (>80mn, >>14women);diabetes mellitus;male gender ? ?   ?Objective:  ?  ?Today's Vitals  ? ?There is no height or weight on file to calculate BMI. ? ? ?  05/28/2021  ?  8:49 AM 03/12/2021  ?  5:30 PM 03/04/2021  ?  8:07 AM 02/20/2021  ? 12:18 PM 02/19/2021  ?  1:28 PM 11/29/2019  ?  6:16 AM 09/04/2019  ? 10:11 AM  ?Advanced Directives  ?Does Patient Have a Medical Advance Directive? No No No  No No No  ?Would patient like information on creating a medical advance directive? No - Patient declined No - Patient declined Yes (MAU/Ambulatory/Procedural Areas - Information given) No - Patient declined  No - Patient declined   ? ? ?Current Medications (verified) ?Outpatient Encounter Medications as of 05/28/2021  ?Medication Sig  ? amLODipine (NORVASC) 10 MG tablet Take 1 tablet (10 mg total) by mouth daily.  ? aspirin EC 81 MG tablet Take 1 tablet (81 mg total) by mouth daily. Swallow whole.  ? atorvastatin (LIPITOR) 80 MG tablet Take 1 tablet (80 mg total) by mouth daily.  ? blood glucose meter kit and supplies KIT Dispense based on patient and insurance preference. Use up to four times daily as directed.  ? clonazePAM (KLONOPIN) 0.25 MG disintegrating tablet Take 1 tablet (0.25 mg total) by mouth 2 (two) times daily. (Patient taking differently: Take 0.25 mg by mouth as needed.)  ? Continuous Blood Gluc Sensor (DEXCOM G6 SENSOR) MISC Apply one every 10 days per package instructions  ? Continuous Blood Gluc Transmit (DEXCOM G6 TRANSMITTER) MISC 1 each by Does not apply route every 3 (three) months.  ? hydrochlorothiazide (HYDRODIURIL) 12.5 MG tablet TAKE 1 TABLET BY MOUTH EVERY DAY  ? JARDIANCE 25 MG TABS tablet TAKE 1 TABLET BY MOUTH DAILY BEFORE BREAKFAST.  ? losartan (COZAAR) 50 MG tablet Take 1 tablet (50 mg total) by mouth daily at 10 pm.  ?  metoprolol succinate (TOPROL-XL) 50 MG 24 hr tablet Take 1 tablet (50 mg total) by mouth daily. Take with or immediately following a meal.  ? ?No facility-administered encounter medications on file as of 05/28/2021.  ? ? ?Allergies (verified) ?Patient has no known allergies.  ? ?History: ?Past Medical History:  ?Diagnosis Date  ? Arthritis   ? Cataract   ? Mixed OU  ? Diabetes [place] complication ([place]   ? [place]   ? NPDR OU  ? Hyperlipidemia   ? Hypertension   ? Hypertensive retinopathy   ? OU  ? Obesity   ? Stroke ([facility]   ? ?Past Surgical History:  ?Procedure Laterality Date  ? BIOPSY  11/29/2019  ? Procedure: BIOPSY;  Surgeon: MIrving Copas, MD;  [facility];  Service: Gastroenterology;;  ? COLONOSCOPY WITH PROPOFOL N/A 11/29/2019  ? Procedure: COLONOSCOPY WITH PROPOFOL;  Surgeon: Mansouraty, GTelford Nab, MD;  [facility];  Service: Gastroenterology;  Laterality: N/A;  ? ENDOSCOPIC MUCOSAL RESECTION N/A 11/29/2019  ? Procedure: ENDOSCOPIC MUCOSAL RESECTION;  Surgeon: MRush LandmarkGTelford Nab, MD;  [facility];  Service: Gastroenterology;  Laterality: N/A;  ? ESOPHAGOGASTRODUODENOSCOPY (EGD) WITH PROPOFOL N/A 11/29/2019  ? Procedure: ESOPHAGOGASTRODUODENOSCOPY (EGD) WITH PROPOFOL;  Surgeon: MRush LandmarkGTelford Nab, MD;  [facility];  Service: Gastroenterology;  Laterality: N/A;  ? EYE  SURGERY Left   ? had to have eye flushed after getting costic sodium in L eye  ? HEMOSTASIS CLIP PLACEMENT  11/29/2019  ? Procedure: HEMOSTASIS CLIP PLACEMENT;  Surgeon: Irving Copas., MD;  Location: [facility];  Service: Gastroenterology;;  ? HERNIA REPAIR    ? HERNIA REPAIR    ? 35 years ago  ? HOT HEMOSTASIS N/A 11/29/2019  ? Procedure: HOT HEMOSTASIS (ARGON PLASMA COAGULATION/BICAP);  Surgeon: Irving Copas., MD;  Location: [facility];  Service: Gastroenterology;  Laterality: N/A;  ? LEFT HEART CATH AND CORONARY ANGIOGRAPHY  N/A 04/11/2019  ? Procedure: LEFT HEART CATH AND CORONARY ANGIOGRAPHY;  Surgeon: Adrian Prows, MD;  Location: [facility];  Service: Cardiovascular;  Laterality: N/A;  ? POLYPECTOMY  11/29/2019  ? Procedure: POLYPECTOMY;  Surgeon: Irving Copas., MD;  Location: [facility];  Service: Gastroenterology;;  ? RENAL ANGIOGRAPHY Bilateral 04/11/2019  ? Procedure: RENAL ANGIOGRAPHY;  Surgeon: Adrian Prows, MD;  Location: [facility];  Service: Cardiovascular;  Laterality: Bilateral;  ? SUBMUCOSAL LIFTING INJECTION  11/29/2019  ? Procedure: SUBMUCOSAL LIFTING INJECTION;  Surgeon: Irving Copas., MD;  Location: [facility];  Service: Gastroenterology;;  ? TOTAL KNEE ARTHROPLASTY Left 08/01/2019  ? Procedure: LEFT TOTAL KNEE ARTHROPLASTY;  Surgeon: Meredith Pel, MD;  Location: [facility];  Service: Orthopedics;  Laterality: Left;  ? ?Family History  ?Problem Relation Age of Onset  ? Diabetes Mother   ? COPD Mother   ? Cancer Mother   ?     unknown type   ? Heart disease Father   ? COPD Sister   ? Healthy Brother   ? Heart disease Brother   ? Healthy Son   ? Colon cancer Neg Hx   ? Colon polyps Neg Hx   ? Esophageal cancer Neg Hx   ? Rectal cancer Neg Hx   ? Stomach cancer Neg Hx   ? Inflammatory bowel disease Neg Hx   ? Liver disease Neg Hx   ? Pancreatic cancer Neg Hx   ? ?Social History  ? ?Socioeconomic History  ? Marital status: Single  ?  Spouse name: Not on file  ? Number of children: 1  ? Years of education: Not on file  ? Highest education level: Not on file  ?Occupational History  ? Not on file  ?Tobacco Use  ? Smoking status: Never  ? Smokeless tobacco: Never  ?Vaping Use  ? Vaping Use: Never used  ?Substance and Sexual Activity  ? Alcohol use: Not Currently  ? Drug use: Never  ? Sexual activity: Not Currently  ?Other Topics Concern  ? Not on file  ?Social History Narrative  ? Not on file  ? ?Social Determinants of Health  ? ?Financial Resource Strain: Low Risk   ? Difficulty of Paying  Living Expenses: Not hard at all  ?Food Insecurity: No Food Insecurity  ? Worried About Charity fundraiser in the Last Year: Never true  ? Ran Out of Food in the Last Year: Never true  ?Transportation Needs: No Transportation Needs  ? Lack of Transportation (Medical): No  ? Lack of Transportation (Non-Medical): No  ?Physical Activity: Inactive  ? Days of Exercise per Week: 0 days  ? Minutes of Exercise per Session: 0 min  ?Stress: No Stress Concern Present  ? Feeling of Stress : Only a little  ?Social Connections: Moderately Isolated  ? Frequency of Communication with Friends and Family: Three times a week  ? Frequency  of Social Gatherings with Friends and Family: Three times a week  ? Attends Religious Services: More than 4 times per year  ? Active Member of Clubs or Organizations: No  ? Attends Archivist Meetings: Never  ? Marital Status: Divorced  ? ? ?Tobacco Counseling ?Counseling given: Not Answered ? ? ?Clinical Intake: ? ?Pre-visit preparation completed: Yes ? ?Pain : No/denies pain ? ?  ? ?Nutritional Risks: None ?Diabetes: Yes ?CBG done?: No ?Did pt. bring in CBG monitor from home?: No ? ?How often do you need to have someone help you when you read instructions, pamphlets, or other written materials from your doctor or pharmacy?: 1 - Never ?What is the last grade level you completed in school?: 11 th grade ? ?Diabetic?yes ?Nutrition Risk Assessment: ? ?Has the patient had any N/V/D within the last 2 months?  No  ?Does the patient have any non-healing wounds?  Yes right toe treatment wound center  ?Has the patient had any unintentional weight loss or weight gain?  No  ? ?Diabetes: ? ?Is the patient diabetic?  Yes  ?If diabetic, was a CBG obtained today?  No  ?Did the patient bring in their glucometer from home?  No  ?How often do you monitor your CBG's? 3 x day .  ? ?Financial Strains and Diabetes Management: ? ?Are you having any financial strains with the device, your supplies or your  medication? No .  ?Does the patient want to be seen by Chronic Care Management for management of their diabetes?  No  ?Would the patient like to be referred to a Nutritionist or for Diabetic Management?  No  ? ?Diabetic

## 2021-05-28 NOTE — Patient Instructions (Signed)
Mr. Hartman , ?Thank you for taking time to come for your Medicare Wellness Visit. I appreciate your ongoing commitment to your health goals. Please review the following plan we discussed and let me know if I can assist you in the future.  ? ?Screening recommendations/referrals: ?Colonoscopy: referral 05/28/2021 ?Recommended yearly ophthalmology/optometry visit for glaucoma screening and checkup ?Recommended yearly dental visit for hygiene and checkup ? ?Vaccinations: ?Influenza vaccine: completed  ?Pneumococcal vaccine: completed  ?Tdap vaccine: due  ?Shingles vaccine: will consider    ? ?Advanced directives: none  ? ?Conditions/risks identified: none  ? ?Next appointment: none  ? ?Preventive Care 64 Years and Older, Male ?Preventive care refers to lifestyle choices and visits with your health care provider that can promote health and wellness. ?What does preventive care include? ?A yearly physical exam. This is also called an annual well check. ?Dental exams once or twice a year. ?Routine eye exams. Ask your health care provider how often you should have your eyes checked. ?Personal lifestyle choices, including: ?Daily care of your teeth and gums. ?Regular physical activity. ?Eating a healthy diet. ?Avoiding tobacco and drug use. ?Limiting alcohol use. ?Practicing safe sex. ?Taking low doses of aspirin every day. ?Taking vitamin and mineral supplements as recommended by your health care provider. ?What happens during an annual well check? ?The services and screenings done by your health care provider during your annual well check will depend on your age, overall health, lifestyle risk factors, and family history of disease. ?Counseling  ?Your health care provider may ask you questions about your: ?Alcohol use. ?Tobacco use. ?Drug use. ?Emotional well-being. ?Home and relationship well-being. ?Sexual activity. ?Eating habits. ?History of falls. ?Memory and ability to understand (cognition). ?Work and work  Statistician. ?Screening  ?You may have the following tests or measurements: ?Height, weight, and BMI. ?Blood pressure. ?Lipid and cholesterol levels. These may be checked every 5 years, or more frequently if you are over 7 years old. ?Skin check. ?Lung cancer screening. You may have this screening every year starting at age 68 if you have a 30-pack-year history of smoking and currently smoke or have quit within the past 15 years. ?Fecal occult blood test (FOBT) of the stool. You may have this test every year starting at age 73. ?Flexible sigmoidoscopy or colonoscopy. You may have a sigmoidoscopy every 5 years or a colonoscopy every 10 years starting at age 59. ?Prostate cancer screening. Recommendations will vary depending on your family history and other risks. ?Hepatitis C blood test. ?Hepatitis B blood test. ?Sexually transmitted disease (STD) testing. ?Diabetes screening. This is done by checking your blood sugar (glucose) after you have not eaten for a while (fasting). You may have this done every 1-3 years. ?Abdominal aortic aneurysm (AAA) screening. You may need this if you are a current or former smoker. ?Osteoporosis. You may be screened starting at age 67 if you are at high risk. ?Talk with your health care provider about your test results, treatment options, and if necessary, the need for more tests. ?Vaccines  ?Your health care provider may recommend certain vaccines, such as: ?Influenza vaccine. This is recommended every year. ?Tetanus, diphtheria, and acellular pertussis (Tdap, Td) vaccine. You may need a Td booster every 10 years. ?Zoster vaccine. You may need this after age 33. ?Pneumococcal 13-valent conjugate (PCV13) vaccine. One dose is recommended after age 1. ?Pneumococcal polysaccharide (PPSV23) vaccine. One dose is recommended after age 38. ?Talk to your health care provider about which screenings and vaccines you need and how  often you need them. ?This information is not intended to replace  advice given to you by your health care provider. Make sure you discuss any questions you have with your health care provider. ?Document Released: 02/15/2015 Document Revised: 10/09/2015 Document Reviewed: 11/20/2014 ?Elsevier Interactive Patient Education ? 2017 [place]. ? ?Fall Prevention in the Home ?Falls can cause injuries. They can happen to people of all ages. There are many things you can do to make your home safe and to help prevent falls. ?What can I do on the outside of my home? ?Regularly fix the edges of walkways and driveways and fix any cracks. ?Remove anything that might make you trip as you walk through a door, such as a raised step or threshold. ?Trim any bushes or trees on the path to your home. ?Use bright outdoor lighting. ?Clear any walking paths of anything that might make someone trip, such as rocks or tools. ?Regularly check to see if handrails are loose or broken. Make sure that both sides of any steps have handrails. ?Any raised decks and porches should have guardrails on the edges. ?Have any leaves, snow, or ice cleared regularly. ?Use sand or salt on walking paths during winter. ?Clean up any spills in your garage right away. This includes oil or grease spills. ?What can I do in the bathroom? ?Use night lights. ?Install grab bars by the toilet and in the tub and shower. Do not use towel bars as grab bars. ?Use non-skid mats or decals in the tub or shower. ?If you need to sit down in the shower, use a plastic, non-slip stool. ?Keep the floor dry. Clean up any water that spills on the floor as soon as it happens. ?Remove soap buildup in the tub or shower regularly. ?Attach bath mats securely with double-sided non-slip rug tape. ?Do not have throw rugs and other things on the floor that can make you trip. ?What can I do in the bedroom? ?Use night lights. ?Make sure that you have a light by your bed that is easy to reach. ?Do not use any sheets or blankets that are too big for your bed.  They should not hang down onto the floor. ?Have a firm chair that has side arms. You can use this for support while you get dressed. ?Do not have throw rugs and other things on the floor that can make you trip. ?What can I do in the kitchen? ?Clean up any spills right away. ?Avoid walking on wet floors. ?Keep items that you use a lot in easy-to-reach places. ?If you need to reach something above you, use a strong step stool that has a grab bar. ?Keep electrical cords out of the way. ?Do not use floor polish or wax that makes floors slippery. If you must use wax, use non-skid floor wax. ?Do not have throw rugs and other things on the floor that can make you trip. ?What can I do with my stairs? ?Do not leave any items on the stairs. ?Make sure that there are handrails on both sides of the stairs and use them. Fix handrails that are broken or loose. Make sure that handrails are as long as the stairways. ?Check any carpeting to make sure that it is firmly attached to the stairs. Fix any carpet that is loose or worn. ?Avoid having throw rugs at the top or bottom of the stairs. If you do have throw rugs, attach them to the floor with carpet tape. ?Make sure that you have a  light switch at the top of the stairs and the bottom of the stairs. If you do not have them, ask someone to add them for you. ?What else can I do to help prevent falls? ?Wear shoes that: ?Do not have high heels. ?Have rubber bottoms. ?Are comfortable and fit you well. ?Are closed at the toe. Do not wear sandals. ?If you use a stepladder: ?Make sure that it is fully opened. Do not climb a closed stepladder. ?Make sure that both sides of the stepladder are locked into place. ?Ask someone to hold it for you, if possible. ?Clearly mark and make sure that you can see: ?Any grab bars or handrails. ?First and last steps. ?Where the edge of each step is. ?Use tools that help you move around (mobility aids) if they are needed. These  include: ?Canes. ?Walkers. ?Scooters. ?Crutches. ?Turn on the lights when you go into a dark area. Replace any light bulbs as soon as they burn out. ?Set up your furniture so you have a clear path. Avoid moving your furniture around. ?I

## 2021-05-29 ENCOUNTER — Other Ambulatory Visit: Payer: Self-pay | Admitting: Cardiology

## 2021-05-29 DIAGNOSIS — I10 Essential (primary) hypertension: Secondary | ICD-10-CM

## 2021-05-29 DIAGNOSIS — I639 Cerebral infarction, unspecified: Secondary | ICD-10-CM

## 2021-06-02 ENCOUNTER — Encounter: Payer: Self-pay | Admitting: Registered Nurse

## 2021-06-02 ENCOUNTER — Ambulatory Visit (INDEPENDENT_AMBULATORY_CARE_PROVIDER_SITE_OTHER): Payer: Medicare Other | Admitting: Registered Nurse

## 2021-06-02 ENCOUNTER — Ambulatory Visit: Payer: Medicare Other | Admitting: Registered Nurse

## 2021-06-02 ENCOUNTER — Other Ambulatory Visit: Payer: Self-pay

## 2021-06-02 VITALS — BP 134/62 | HR 80 | Temp 98.2°F | Resp 18 | Ht 68.0 in | Wt 240.1 lb

## 2021-06-02 DIAGNOSIS — E119 Type 2 diabetes mellitus without complications: Secondary | ICD-10-CM | POA: Diagnosis not present

## 2021-06-02 LAB — COMPREHENSIVE METABOLIC PANEL
ALT: 22 U/L (ref 0–53)
AST: 22 U/L (ref 0–37)
Albumin: 4.2 g/dL (ref 3.5–5.2)
Alkaline Phosphatase: 72 U/L (ref 39–117)
BUN: 31 mg/dL — ABNORMAL HIGH (ref 6–23)
CO2: 26 mEq/L (ref 19–32)
Calcium: 9.3 mg/dL (ref 8.4–10.5)
Chloride: 102 mEq/L (ref 96–112)
Creatinine, Ser: 1.22 mg/dL (ref 0.40–1.50)
GFR: 61.25 mL/min (ref >60.00)
Glucose, Bld: 232 mg/dL — ABNORMAL HIGH (ref 70–99)
Potassium: 4.1 mEq/L (ref 3.5–5.1)
Sodium: 137 mEq/L (ref 135–145)
Total Bilirubin: 0.6 mg/dL (ref 0.2–1.2)
Total Protein: 7.8 g/dL (ref 6.0–8.3)

## 2021-06-02 LAB — HEMOGLOBIN A1C: Hgb A1c MFr Bld: 7.4 % — ABNORMAL HIGH (ref 4.6–6.5)

## 2021-06-02 NOTE — Progress Notes (Signed)
? ?Established Patient Office Visit ? ?Subjective:  ?Patient ID: Joe Stewart, male    DOB: 1955/10/11  Age: 66 y.o. MRN: [id number] ? ?CC:  ?Chief Complaint  ?Patient presents with  ? Follow-up  ?  Patient is here for 3 month diabetes follow up.and some questions about medications.  ? ? ?HPI ?[name] presents for 3 mo follow up  ?t2dm ?Last A1c:  ?Lab Results  ?Component Value Date  ? HGBA1C 8.3 (H) 04/15/2021  ?  ?Currently taking: jardiance 10m po qd ?No new complications ?Reports good compliance with medications ?Diet has been steady, healthy ?Exercise habits have been steady, per rehab recommendations. Compliant with plan.  ? ?Med management. ?Does not like the way he feels when he takes all of his medications in the morning ?Would prefer to space them out between morning and evening ?He does have good compliance and does not skip doses.  ? ? ?Outpatient Medications Prior to Visit  ?Medication Sig Dispense Refill  ? amLODipine (NORVASC) 10 MG tablet Take 1 tablet (10 mg total) by mouth daily. 30 tablet 2  ? aspirin EC 81 MG tablet Take 1 tablet (81 mg total) by mouth daily. Swallow whole. 30 tablet 11  ? atorvastatin (LIPITOR) 80 MG tablet Take 1 tablet (80 mg total) by mouth daily. 30 tablet 2  ? blood glucose meter kit and supplies KIT Dispense based on patient and insurance preference. Use up to four times daily as directed. 1 each 0  ? clonazePAM (KLONOPIN) 0.25 MG disintegrating tablet Take 1 tablet (0.25 mg total) by mouth 2 (two) times daily. (Patient taking differently: Take 0.25 mg by mouth as needed.) 60 tablet 0  ? Continuous Blood Gluc Sensor (DEXCOM G6 SENSOR) MISC Apply one every 10 days per package instructions 9 each 1  ? Continuous Blood Gluc Transmit (DEXCOM G6 TRANSMITTER) MISC 1 each by Does not apply route every 3 (three) months. 1 each 1  ? hydrochlorothiazide (HYDRODIURIL) 12.5 MG tablet TAKE 1 TABLET BY MOUTH EVERY DAY 90 tablet 0  ? JARDIANCE 25 MG TABS tablet TAKE 1 TABLET BY MOUTH  DAILY BEFORE BREAKFAST. 90 tablet 0  ? losartan (COZAAR) 50 MG tablet TAKE 1 TABLET (50 MG TOTAL) BY MOUTH DAILY AT 10 PM. 90 tablet 0  ? metoprolol succinate (TOPROL-XL) 50 MG 24 hr tablet Take 1 tablet (50 mg total) by mouth daily. Take with or immediately following a meal. 90 tablet 3  ? ?No facility-administered medications prior to visit.  ? ? ?Review of Systems  ?Constitutional: Negative.   ?HENT: Negative.    ?Eyes: Negative.   ?Respiratory: Negative.    ?Cardiovascular: Negative.   ?Gastrointestinal: Negative.   ?Genitourinary: Negative.   ?Musculoskeletal: Negative.   ?Skin: Negative.   ?Neurological: Negative.   ?Psychiatric/Behavioral: Negative.    ?All other systems reviewed and are negative. ? ?  ?Objective:  ?  ? ?BP 134/62   Pulse 80   Temp 98.2 ?F (36.8 ?C) (Temporal)   Resp 18   Ht '5\' 8"'  (1.727 m)   Wt 240 lb 1.6 oz (108.9 kg)   SpO2 99%   BMI 36.51 kg/m?  ? ?Wt Readings from Last 3 Encounters:  ?06/02/21 240 lb 1.6 oz (108.9 kg)  ?05/09/21 241 lb 12.8 oz (109.7 kg)  ?04/25/21 241 lb (109.3 kg)  ? ?Physical Exam ?Constitutional:   ?   General: He is not in acute distress. ?   Appearance: Normal appearance. He is normal weight. He is not ill-appearing,  toxic-appearing or diaphoretic.  ?Cardiovascular:  ?   Rate and Rhythm: Normal rate and regular rhythm.  ?   Heart sounds: Normal heart sounds. No murmur heard. ?  No friction rub. No gallop.  ?Pulmonary:  ?   Effort: Pulmonary effort is normal. No respiratory distress.  ?   Breath sounds: Normal breath sounds. No stridor. No wheezing, rhonchi or rales.  ?Chest:  ?   Chest wall: No tenderness.  ?Neurological:  ?   General: No focal deficit present.  ?   Mental Status: He is alert and oriented to person, place, and time. Mental status is at baseline.  ?Psychiatric:     ?   Mood and Affect: Mood normal.     ?   Behavior: Behavior normal.     ?   Thought Content: Thought content normal.     ?   Judgment: Judgment normal.  ? ? ?No results found for  any visits on 06/02/21. ? ? ? ?The ASCVD Risk score (Arnett DK, et al., 2019) failed to calculate for the following reasons: ?  The patient has a prior MI or stroke diagnosis ? ?  ?Assessment & Plan:  ? ?Problem List Items Addressed This Visit   ? ?  ? Endocrine  ? Type 2 diabetes mellitus without complication, without long-term current use of insulin ([place]) - Primary  ? Relevant Orders  ? Hemoglobin A1c  ? Comprehensive metabolic panel  ? ? ?No orders of the defined types were placed in this encounter. ? ? ?Return in about 3 months (around 09/02/2021) for Chronic Conditions.  ? ?PLAN ?Recheck a1c.  ?Adjust medication as indicated ?Return in 3 mo if uncontrolled, 6 mo if well controlled ?Patient encouraged to call clinic with any questions, comments, or concerns. ? ? ?[name], NP ?

## 2021-06-02 NOTE — Patient Instructions (Addendum)
Joe Stewart -  ? ?Great to see you! ? ?Let's check A1c. ? ?Medications: ?Morning: ?Hydrochlorothiazide ?Jardiance ?Losartan ?amlodipine ?Evening: ?Metoprolol ?atorvastatin ? ?If moving the metoprolol doesn't do the trick, ok to try jardiance in the evenings as well. ? ?Thanks, ? ?[name]  ? ?If you have lab work done today you will be contacted with your lab results within the next 2 weeks.  If you have not heard from [place] then please contact us. The fastest way to get your results is to register for My Chart. ? ? ?IF you received an x-ray today, you will receive an invoice from [facility]. Please contact [facility] at [phone] with questions or concerns regarding your invoice.  ? ?IF you received labwork today, you will receive an invoice from [facility]. Please contact [facility] at [phone] with questions or concerns regarding your invoice.  ? ?Our billing staff will not be able to assist you with questions regarding bills from these companies. ? ?You will be contacted with the lab results as soon as they are available. The fastest way to get your results is to activate your My Chart account. Instructions are located on the last page of this paperwork. If you have not heard from [place] regarding the results in 2 weeks, please contact this office. ?  ? ? ?

## 2021-06-04 ENCOUNTER — Ambulatory Visit: Payer: Medicare Other | Admitting: Podiatry

## 2021-06-04 ENCOUNTER — Encounter: Payer: Self-pay | Admitting: Podiatry

## 2021-06-04 DIAGNOSIS — E119 Type 2 diabetes mellitus without complications: Secondary | ICD-10-CM

## 2021-06-04 DIAGNOSIS — E11621 Type 2 diabetes mellitus with foot ulcer: Secondary | ICD-10-CM

## 2021-06-04 DIAGNOSIS — L97511 Non-pressure chronic ulcer of other part of right foot limited to breakdown of skin: Secondary | ICD-10-CM

## 2021-06-04 MED ORDER — CEPHALEXIN 500 MG PO CAPS: 500.0000 mg | ORAL_CAPSULE | Freq: Four times a day (QID) | ORAL | 0 refills | Status: AC

## 2021-06-04 NOTE — Progress Notes (Signed)
?  Subjective:  ?Patient ID: Joe Stewart, male    DOB: 11/04/1955,   MRN: [id number] ? ?Chief Complaint  ?Patient presents with  ? Diabetic Ulcer  ?  Diabetic ulcer right 5th toe.   ? ? ?66 y.o. male presents for folow-up of right foot wound. Relates it seems to be getting better. No new issues.   No pain He is diabetic and last A1c was 12.3  . Denies any other pedal complaints. Denies n/v/f/c.  ? ?PCP: [name] MD  ? ?Past Medical History:  ?Diagnosis Date  ? Arthritis   ? Cataract   ? Mixed OU  ? Diabetes mellitus without complication ([place])   ? Diabetic retinopathy ([place])   ? NPDR OU  ? Hyperlipidemia   ? Hypertension   ? Hypertensive retinopathy   ? OU  ? Obesity   ? Stroke [facility])   ? ? ?Objective:  ?Physical Exam: ?Vascular: DP/PT pulses 2/4 bilateral. CFT <3 seconds. Normal hair growth on digits. No edema.  ?Skin. No lacerations or abrasions bilateral feet. Right fifth lateral digit with wound noted measuring about 0.2 cm x 0.4 cm x 0.1 cm with granular base. Undelrying hyperkeratotic tissue Some erythema and edema noted.  ?Musculoskeletal: MMT 5/5 bilateral lower extremities in DF, PF, Inversion and Eversion. Deceased ROM in DF of ankle joint.  ?Neurological: Sensation intact to light touch.  ? ?Assessment:  ? ?1. 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 Type 2 diabetes mellitus without complication, without long-term current use of insulin ([place])   ? ? ? ? ? ? ?Plan:  ?Patient was evaluated and treated and all questions answered. ?Ulcer right fifth toe limited to breakdown of skin  ?-Debridement as below. ?-Dressed with betadine, DSD. ?-Off-loading with surgical shoe.  Discussed wearing surgical shoe again as this will speed the healing process. Discussed wearing any shoe that does not cause rubbing in the fifth toe.  ?-Keflex sent to pharamacy   ?-Discussed glucose control and proper protein-rich diet.  ?-Discussed if any  worsening redness, pain, fever or chills to call or may need to report to the emergency room. Patient expressed understanding.  ? ?Procedure: Excisional Debridement of Wound ?Rationale: Removal of non-viable soft tissue from the wound to promote healing.  ?Anesthesia: none ?Pre-Debridement Wound Measurements: Overlying callus  ?Post-Debridement Wound Measurements: 0.2 cm x 0.4 cm x 0.1 cm  ?Type of Debridement: Sharp Excisional ?Tissue Removed: Non-viable soft tissue ?Depth of Debridement: subcutaneous tissue. ?Technique: Sharp excisional debridement to bleeding, viable wound base.  ?Dressing: Dry, sterile, compression dressing. ?Disposition: Patient tolerated procedure well. Patient to return in 3 week for follow-up. ? ?No follow-ups on file. ? ?No follow-ups on file. ? ? ?Lorenda Peck, DPM  ? ? ?

## 2021-06-12 ENCOUNTER — Institutional Professional Consult (permissible substitution): Payer: Medicare Other | Admitting: Neurology

## 2021-06-16 ENCOUNTER — Ambulatory Visit (INDEPENDENT_AMBULATORY_CARE_PROVIDER_SITE_OTHER): Payer: Medicare Other

## 2021-06-16 DIAGNOSIS — I639 Cerebral infarction, unspecified: Secondary | ICD-10-CM

## 2021-06-17 LAB — CUP PACEART REMOTE DEVICE CHECK
Date Time Interrogation Session: 20230515103614
Implantable Pulse Generator Implant Date: 20230407

## 2021-06-25 ENCOUNTER — Other Ambulatory Visit: Payer: Medicare Other | Admitting: *Deleted

## 2021-06-25 DIAGNOSIS — Z8673 Personal history of transient ischemic attack (TIA), and cerebral infarction without residual deficits: Secondary | ICD-10-CM | POA: Diagnosis not present

## 2021-06-25 DIAGNOSIS — I2510 Atherosclerotic heart disease of native coronary artery without angina pectoris: Secondary | ICD-10-CM | POA: Diagnosis not present

## 2021-06-26 LAB — LIPID PANEL
Chol/HDL Ratio: 3.2 ratio (ref 0.0–5.0)
Cholesterol, Total: 110 mg/dL (ref 100–199)
HDL: 34 mg/dL — ABNORMAL LOW (ref >39)
LDL Chol Calc (NIH): 55 mg/dL (ref 0–99)
Triglycerides: 115 mg/dL (ref 0–149)
VLDL Cholesterol Cal: 21 mg/dL (ref 5–40)

## 2021-06-26 LAB — ALT: ALT: 17 IU/L (ref 0–44)

## 2021-06-26 LAB — LIPOPROTEIN A (LPA): Lipoprotein (a): <8.4 nmol/L (ref <75.0)

## 2021-07-01 ENCOUNTER — Encounter: Payer: Self-pay | Admitting: Podiatry

## 2021-07-01 ENCOUNTER — Ambulatory Visit: Payer: Medicare Other | Admitting: Podiatry

## 2021-07-01 DIAGNOSIS — L84 Corns and callosities: Secondary | ICD-10-CM

## 2021-07-01 DIAGNOSIS — E119 Type 2 diabetes mellitus without complications: Secondary | ICD-10-CM

## 2021-07-01 DIAGNOSIS — H0102A Squamous blepharitis right eye, upper and lower eyelids: Secondary | ICD-10-CM | POA: Diagnosis not present

## 2021-07-01 DIAGNOSIS — E113313 Type 2 diabetes mellitus with moderate nonproliferative diabetic retinopathy with macular edema, bilateral: Secondary | ICD-10-CM | POA: Diagnosis not present

## 2021-07-01 DIAGNOSIS — H35033 Hypertensive retinopathy, bilateral: Secondary | ICD-10-CM | POA: Diagnosis not present

## 2021-07-01 DIAGNOSIS — H25812 Combined forms of age-related cataract, left eye: Secondary | ICD-10-CM | POA: Diagnosis not present

## 2021-07-01 DIAGNOSIS — H0102B Squamous blepharitis left eye, upper and lower eyelids: Secondary | ICD-10-CM | POA: Diagnosis not present

## 2021-07-01 NOTE — Progress Notes (Signed)
  Subjective:  Patient ID: Joe Stewart, male    DOB: [birth year],   MRN: [id number]  No chief complaint on file.   66 y.o. male presents for follow-up of right foot wound. Relates it seems to be getting better. No new issues.   No pain He is diabetic and last A1c was 7.4  . Denies any other pedal complaints. Denies n/v/f/c.   PCP: [name] MD   Past Medical History:  Diagnosis Date   Arthritis    Cataract    Mixed OU   Diabetes mellitus without complication ([place])    Diabetic retinopathy ([place])    NPDR OU   Hyperlipidemia    Hypertension    Hypertensive retinopathy    OU   Obesity    Stroke [facility])     Objective:  Physical Exam: Vascular: DP/PT pulses 2/4 bilateral. CFT <3 seconds. Normal hair growth on digits. No edema.  Skin. No lacerations or abrasions bilateral feet. Right fifth lateral digit with wound healed. Undelrying hyperkeratotic tissue Some erythema and edema noted.  Musculoskeletal: MMT 5/5 bilateral lower extremities in DF, PF, Inversion and Eversion. Deceased ROM in DF of ankle joint.  Neurological: Sensation intact to light touch.   Assessment:   1. 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 Type 2 diabetes mellitus without complication, without long-term current use of insulin ([place])          Plan:  Patient was evaluated and treated and all questions answered. Ulcer right fifth toe- healed.  -Debridement of hyperkeratatotic tissue with healed ulcer underlying -Dressed with neosporin, DSD. -Discussed glucose control and proper protein-rich diet.  -Discussed if any worsening redness, pain, fever or chills to call or may need to report to the emergency room. Patient expressed understanding.  -follow-up in 3 weeks for recheck   No follow-ups on file.  No follow-ups on file.   Lorenda Peck, DPM

## 2021-07-02 NOTE — Progress Notes (Signed)
[facility] Note  07/07/2021     CHIEF COMPLAINT Patient presents for Retina Follow Up   HISTORY OF PRESENT ILLNESS: Joe Stewart is a 66 y.o. male who presents to the clinic today for:  HPI     Retina Follow Up           Diagnosis: Diabetic Retinopathy   Laterality: both eyes   Duration: 6 weeks   Course: stable         Comments   6 week follow up NPDR OU-  OS is still a little blurry, no changes OU.   BS 172 this morning, A1C 7.4      Last edited by Joe Stewart, COA on 07/07/2021  9:15 AM.       Referring physician: Shirleen Stewart, [address] [place], [place] [address]   HISTORICAL INFORMATION:   Selected notes from the MEDICAL RECORD NUMBER Referred by Joe Perking Lunduist, PA LEE: 09.15.21 BCVA OD: 20/20 OS: 20/20 Ocular Hx- NPDR with edema  PMH- DM, HTN   CURRENT MEDICATIONS: No current outpatient medications on file. (Ophthalmic Drugs)   No current facility-administered medications for this visit. (Ophthalmic Drugs)   Current Outpatient Medications (Other)  Medication Sig   amLODipine (NORVASC) 10 MG tablet Take 1 tablet (10 mg total) by mouth daily.   aspirin EC 81 MG tablet Take 1 tablet (81 mg total) by mouth daily. Swallow whole.   atorvastatin (LIPITOR) 80 MG tablet Take 1 tablet (80 mg total) by mouth daily.   clonazePAM (KLONOPIN) 0.25 MG disintegrating tablet Take 1 tablet (0.25 mg total) by mouth 2 (two) times daily. (Patient taking differently: Take 0.25 mg by mouth as needed.)   hydrochlorothiazide (HYDRODIURIL) 12.5 MG tablet TAKE 1 TABLET BY MOUTH EVERY DAY   JARDIANCE 25 MG TABS tablet TAKE 1 TABLET BY MOUTH DAILY BEFORE BREAKFAST.   losartan (COZAAR) 50 MG tablet TAKE 1 TABLET (50 MG TOTAL) BY MOUTH DAILY AT 10 PM.   metoprolol succinate (TOPROL-XL) 50 MG 24 hr tablet Take 1 tablet (50 mg total) by mouth daily. Take with or immediately following a meal.   blood glucose meter kit and supplies KIT  Dispense based on patient and insurance preference. Use up to four times daily as directed.   Continuous Blood Gluc Sensor (DEXCOM G6 SENSOR) MISC Apply one every 10 days per package instructions   Continuous Blood Gluc Transmit (DEXCOM G6 TRANSMITTER) MISC 1 each by Does not apply route every 3 (three) months.   No current facility-administered medications for this visit. (Other)   REVIEW OF SYSTEMS: ROS   Positive for: Gastrointestinal, Neurological, Musculoskeletal, Endocrine, Eyes Negative for: Constitutional, Skin, Genitourinary, HENT, Cardiovascular, Respiratory, Psychiatric, Allergic/Imm, Heme/Lymph Last edited by Joe Stewart, COA on 07/07/2021  9:15 AM.      ALLERGIES No Known Allergies  PAST MEDICAL HISTORY Past Medical History:  Diagnosis Date   Arthritis    Cataract    Mixed OU   Diabetes mellitus without complication ([place])    Diabetic retinopathy ([place])    NPDR OU   Hyperlipidemia    Hypertension    Hypertensive retinopathy    OU   Obesity    Stroke [facility])    Past Surgical History:  Procedure Laterality Date   BIOPSY  11/29/2019   Procedure: BIOPSY;  Surgeon: Joe Stewart., MD;  Location: [facility];  Service: Gastroenterology;;   COLONOSCOPY WITH PROPOFOL N/A 11/29/2019   Procedure: COLONOSCOPY WITH PROPOFOL;  Surgeon: Joe Stewart., MD;  Location: [facility];  Service: Gastroenterology;  Laterality: N/A;   ENDOSCOPIC MUCOSAL RESECTION N/A 11/29/2019   Procedure: ENDOSCOPIC MUCOSAL RESECTION;  Surgeon: Joe Stewart., MD;  Location: [facility];  Service: Gastroenterology;  Laterality: N/A;   ESOPHAGOGASTRODUODENOSCOPY (EGD) WITH PROPOFOL N/A 11/29/2019   Procedure: ESOPHAGOGASTRODUODENOSCOPY (EGD) WITH PROPOFOL;  Surgeon: Joe Stewart., MD;  Location: [facility];  Service: Gastroenterology;  Laterality: N/A;   EYE SURGERY Left    had to have eye flushed after getting costic sodium in L eye   HEMOSTASIS CLIP  PLACEMENT  11/29/2019   Procedure: HEMOSTASIS CLIP PLACEMENT;  Surgeon: Joe Stewart., MD;  Location: [facility];  Service: Gastroenterology;;   HERNIA REPAIR     HERNIA REPAIR     35 years ago   HOT HEMOSTASIS N/A 11/29/2019   Procedure: HOT HEMOSTASIS (ARGON PLASMA COAGULATION/BICAP);  Surgeon: Joe Stewart., MD;  Location: [facility];  Service: Gastroenterology;  Laterality: N/A;   LEFT HEART CATH AND CORONARY ANGIOGRAPHY N/A 04/11/2019   Procedure: LEFT HEART CATH AND CORONARY ANGIOGRAPHY;  Surgeon: Joe Prows, MD;  Location: [place];  Service: Cardiovascular;  Laterality: N/A;   POLYPECTOMY  11/29/2019   Procedure: POLYPECTOMY;  Surgeon: Joe Stewart., MD;  Location: [facility];  Service: Gastroenterology;;   RENAL ANGIOGRAPHY Bilateral 04/11/2019   Procedure: RENAL ANGIOGRAPHY;  Surgeon: Joe Prows, MD;  Location: [place] [facility];  Service: Cardiovascular;  Laterality: Bilateral;   SUBMUCOSAL LIFTING INJECTION  11/29/2019   Procedure: SUBMUCOSAL LIFTING INJECTION;  Surgeon: Joe Stewart., MD;  Location: [facility];  Service: Gastroenterology;;   TOTAL KNEE ARTHROPLASTY Left 08/01/2019   Procedure: LEFT TOTAL KNEE ARTHROPLASTY;  Surgeon: Joe Pel, MD;  Location: [place];  Service: Orthopedics;  Laterality: Left;   FAMILY HISTORY Family History  Problem Relation Age of Onset   Diabetes Mother    COPD Mother    Cancer Mother        unknown type    Heart disease Father    COPD Sister    Healthy Brother    Heart disease Brother    Healthy Son    Colon cancer Neg Hx    Colon polyps Neg Hx    Esophageal cancer Neg Hx    Rectal cancer Neg Hx    Stomach cancer Neg Hx    Inflammatory bowel disease Neg Hx    Liver disease Neg Hx    Pancreatic cancer Neg Hx    SOCIAL HISTORY Social History   Tobacco Use   Smoking status: Never   Smokeless tobacco: Never  Vaping Use   Vaping Use: Never used  Substance Use Topics    Alcohol use: Not Currently   Drug use: Never       OPHTHALMIC EXAM:  Base Eye Exam     Visual Acuity (Snellen - Linear)       Right Left   Dist [place] 20/30 20/40-   Dist ph [place] 20/30 +2 20/40 +2         Tonometry (Tonopen, 9:23 AM)       Right Left   Pressure 16 12         Pupils       Dark Light Shape React APD   Right 3 2 Round Brisk None   Left 3 2 Round Brisk None         Visual Fields (Counting fingers)       Left  Right   Restrictions Total superior nasal deficiency Total superior temporal deficiency         Extraocular Movement       Right Left    Full Full         Neuro/Psych     Oriented x3: Yes   Mood/Affect: Normal         Dilation     Both eyes: 1.0% Mydriacyl, 2.5% Phenylephrine @ 9:23 AM           Slit Lamp and Fundus Exam     Slit Lamp Exam       Right Left   Lids/Lashes Dermatochalasis - upper lid, Meibomian gland dysfunction Dermatochalasis - upper lid, Meibomian gland dysfunction   Conjunctiva/Sclera White and quiet White and quiet   Cornea Trace inferior Punctate epithelial erosions, mild tear film debris Mild tear film debris, 1+ Punctate epithelial erosions   Anterior Chamber Deep and quiet, deep, clear, narrow temporal angle Deep and quiet   Iris Round and dilated, No NVI Round and moderately dilated to 6.41m, No NVI, PPM nasal   Lens 2-3+ Nuclear sclerosis, 2-3+ Cortical cataract, +Vacuoles 2+ Nuclear sclerosis, 2-3+ Cortical cataract, +Vacuoles   Anterior Vitreous Vitreous syneresis Vitreous syneresis         Fundus Exam       Right Left   Disc Pink and Sharp, PPA/PPP Pink and Sharp, +Peripapillary atrophy, Compact   C/D Ratio 0.2 0.1   Macula Flat, Blunted foveal reflex, scattered MA/IRH, RPE mottling and clumping, trace cystic changes - improved Flat, Blunted foveal reflex, rare MA, focal cluster of exudates and cystic changes superior macula -- improving   Vessels attenuated, Tortuous attenuated,  Tortuous, mild Copper wiring, mild AV crossing changes   Periphery Attached, pigmented cystoid degeneration ST periphery, rare scattered MA/IRH ST periphery, focal pigmented VR tuft at 0900 Attached, scattered MA / DBH greatest posteriorly, focal pigmented CR scarring along distal ST arcades (0130)            IMAGING AND PROCEDURES  Imaging and Procedures for 07/07/2021  OCT, Retina - OU - Both Eyes       Right Eye Quality was good. Central Foveal Thickness: 322. Progression has been stable. Findings include normal foveal contour, no SRF, intraretinal hyper-reflective material, no IRF (Scattered cystic changes - improved, focal IRHM nasal macula - improved, very shallow schisis ST periphery caught on widefield - stable from prior).   Left Eye Quality was good. Central Foveal Thickness: 313. Progression has improved. Findings include intraretinal fluid, no SRF, vitreomacular adhesion , normal foveal contour (Mild interval improvement in IRF/IRHM, focal cystic changes superior macula).   Notes *Images captured and stored on drive  Diagnosis / Impression:  DME OU OD: Scattered cystic changes - improved, focal IRHM nasal macula - improved, very shallow schisis ST periphery caught on widefield - stable from prior OS: Mild interval improvement in IRF/IRHM, focal cystic changes superior macula  Clinical management:  See below  Abbreviations: NFP - Normal foveal profile. CME - cystoid macular edema. PED - pigment epithelial detachment. IRF - intraretinal fluid. SRF - subretinal fluid. EZ - ellipsoid zone. ERM - epiretinal membrane. ORA - outer retinal atrophy. ORT - outer retinal tubulation. SRHM - subretinal hyper-reflective material. IRHM - intraretinal hyper-reflective material             ASSESSMENT/PLAN:    ICD-10-CM   1. Acute CVA (cerebrovascular accident) ([place]  I63.9 OCT, Retina - OU - Both Eyes    2.  Visual field loss following cerebrovascular accident  I69.398    H54.7      3. Moderate nonproliferative diabetic retinopathy of both eyes with macular edema associated with type 2 diabetes mellitus ([place])  G25.6389     4. Essential hypertension  I10     5. Hypertensive retinopathy of both eyes  H35.033     6. Combined forms of age-related cataract of both eyes  H25.813       1,2. 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  - BCVA remains pretty good -- 20/30+ OD, 20/40+ OS  - baseline visual field obtained, 04.24.23 -- right homonymous superior quadrantanopia consistent w/ L PCA stroke  - f/u 3 month DFE, OCT  3. Moderate Non-proliferative diabetic retinopathy w/ DME OU  - pt lost to f/u from Mar 2022 to Feb 2023 (11 mos)  - S/P IVA OD #1 (10.26.21), #2 (11.24.21), #3 (12.22.21), #4 (03/06/20), #5 (03.23.22)  - S/P IVA OS #1 (03.23.22)             - A1c 7.4% 05.01.23, 8.3% on 03.14.23; 12.3% on 01.19.23, 8.3% on 12.03.21; 7.4% on 06.04.21 - BCVA 20/30+ OD, 20/40+ OS  - exam shows scattered MA and mild edema/cystic changes OU - FA (09.29.21) shows late leaking MA OU; no NV OU  - OCT shows OD: Scattered cystic changes - improved, focal IRHM nasal macula - improved, very shallow schisis ST periphery caught on widefield - stable from prior; OS: Mild interval improvement in IRF/IRHM, focal cystic changes superior macula  - no retinal intervention recommended today as DME remains fairly minimal despite 12+ mos since last shot  - pt had stoke 01.18.23 - will hold off on treatment until at least 3 months post-CVA - f/u 3 months DFE, OCT  4,5. Hypertensive retinopathy OU - discussed importance of tight BP control - monitor  6. Mixed Cataract OU - The symptoms of cataract, surgical options, and treatments and risks  were discussed with patient. - discussed diagnosis and progression - approaching visual significance - cataract sx scheduled for the summer with Dr. Katy Fitch  - clear from a retinal standpoint to proceed with cataract surgery when both patient and surgeon are ready   Ophthalmic Meds Ordered this visit:  No orders of the defined types were placed in this encounter.   Return in about 3 months (around 10/07/2021) for DFE, OCT.  There are no Patient Instructions on file for this visit.  This document serves as a record of services personally performed by Gardiner Sleeper, MD, PhD. It was created on their behalf by Roselee Nova, COMT. The creation of this record is the provider's dictation and/or activities during the visit.  Electronically signed by: Roselee Nova, COMT 07/07/21 10:52 AM  This document serves as a record of services personally performed by Gardiner Sleeper, MD, PhD. It was created on their behalf by Joe Stewart, an ophthalmic technician. The creation of this record is the provider's dictation and/or activities during the visit.    Electronically signed by: Joe Stewart COA, 07/07/21  10:52 AM   Gardiner Sleeper, M.D., Ph.D. Diseases & Surgery of the Retina and Vitreous [facility]: M myopia (nearsighted); A astigmatism; H hyperopia (farsighted); P presbyopia; Mrx spectacle  prescription;  CTL contact lenses; OD right eye; OS left eye; OU both eyes  XT exotropia; ET esotropia; PEK punctate epithelial keratitis; PEE punctate epithelial erosions; DES dry eye syndrome; MGD meibomian gland dysfunction; ATs artificial tears; PFAT's preservative free artificial tears; [place] nuclear sclerotic cataract; PSC posterior subcapsular cataract; ERM epi-retinal membrane; PVD posterior vitreous detachment; RD retinal detachment; DM diabetes mellitus; DR diabetic retinopathy; NPDR non-proliferative diabetic retinopathy; PDR proliferative diabetic retinopathy;  CSME clinically significant macular edema; DME diabetic macular edema; dbh dot blot hemorrhages; CWS cotton wool spot; POAG primary open angle glaucoma; C/D cup-to-disc ratio; HVF humphrey visual field; GVF goldmann visual field; OCT optical coherence tomography; IOP intraocular pressure; BRVO Branch retinal vein occlusion; CRVO central retinal vein occlusion; CRAO central retinal artery occlusion; BRAO branch retinal artery occlusion; RT retinal tear; SB scleral buckle; PPV pars plana vitrectomy; VH Vitreous hemorrhage; PRP panretinal laser photocoagulation; IVK intravitreal kenalog; VMT vitreomacular traction; MH Macular hole;  NVD neovascularization of the disc; NVE neovascularization elsewhere; AREDS age related eye disease study; ARMD age related macular degeneration; POAG primary open angle glaucoma; EBMD epithelial/anterior basement membrane dystrophy; ACIOL anterior chamber intraocular lens; IOL intraocular lens; PCIOL posterior chamber intraocular lens; Phaco/IOL phacoemulsification with intraocular lens placement; [place] photorefractive keratectomy; LASIK laser assisted in situ keratomileusis; HTN hypertension; DM diabetes mellitus; COPD chronic obstructive pulmonary disease

## 2021-07-07 ENCOUNTER — Ambulatory Visit (INDEPENDENT_AMBULATORY_CARE_PROVIDER_SITE_OTHER): Payer: Medicare Other | Admitting: Ophthalmology

## 2021-07-07 ENCOUNTER — Encounter (INDEPENDENT_AMBULATORY_CARE_PROVIDER_SITE_OTHER): Payer: Self-pay | Admitting: Ophthalmology

## 2021-07-07 DIAGNOSIS — E113313 Type 2 diabetes mellitus with moderate nonproliferative diabetic retinopathy with macular edema, bilateral: Secondary | ICD-10-CM

## 2021-07-07 DIAGNOSIS — H25813 Combined forms of age-related cataract, bilateral: Secondary | ICD-10-CM

## 2021-07-07 DIAGNOSIS — I639 Cerebral infarction, unspecified: Secondary | ICD-10-CM

## 2021-07-07 DIAGNOSIS — I10 Essential (primary) hypertension: Secondary | ICD-10-CM

## 2021-07-07 DIAGNOSIS — H35033 Hypertensive retinopathy, bilateral: Secondary | ICD-10-CM | POA: Diagnosis not present

## 2021-07-07 DIAGNOSIS — H547 Unspecified visual loss: Secondary | ICD-10-CM

## 2021-07-07 LAB — HM DIABETES EYE EXAM

## 2021-07-07 NOTE — Progress Notes (Signed)
[facility] Summary Report / Loop Recorder 

## 2021-07-09 ENCOUNTER — Encounter (INDEPENDENT_AMBULATORY_CARE_PROVIDER_SITE_OTHER): Payer: Self-pay | Admitting: Ophthalmology

## 2021-07-21 ENCOUNTER — Ambulatory Visit (INDEPENDENT_AMBULATORY_CARE_PROVIDER_SITE_OTHER): Payer: Medicare Other

## 2021-07-21 DIAGNOSIS — I639 Cerebral infarction, unspecified: Secondary | ICD-10-CM

## 2021-07-22 ENCOUNTER — Ambulatory Visit: Payer: Medicare Other | Admitting: Podiatry

## 2021-07-22 DIAGNOSIS — L84 Corns and callosities: Secondary | ICD-10-CM

## 2021-07-22 DIAGNOSIS — E119 Type 2 diabetes mellitus without complications: Secondary | ICD-10-CM

## 2021-07-22 NOTE — Progress Notes (Signed)
  Subjective:  Patient ID: Joe Stewart, male    DOB: [birth year],   MRN: [id number]  Chief Complaint  Patient presents with   Wound Check    Right foot ulcer check    66 y.o. male presents for follow-up of right foot wound. Relates it seems to still be doing well.   No pain He is diabetic and last A1c was 7.4  . Denies any other pedal complaints. Denies n/v/f/c.   PCP: [name] MD   Past Medical History:  Diagnosis Date   Arthritis    Cataract    Mixed OU   Diabetes mellitus without complication ([place])    Diabetic retinopathy ([place])    NPDR OU   Hyperlipidemia    Hypertension    Hypertensive retinopathy    OU   Obesity    Stroke [facility])     Objective:  Physical Exam: Vascular: DP/PT pulses 2/4 bilateral. CFT <3 seconds. Normal hair growth on digits. No edema.  Skin. No lacerations or abrasions bilateral feet. Right fifth lateral digit with wound healed. Undelrying hyperkeratotic tissue Some erythema and edema noted.  Musculoskeletal: MMT 5/5 bilateral lower extremities in DF, PF, Inversion and Eversion. Deceased ROM in DF of ankle joint.  Neurological: Sensation intact to light touch.   Assessment:   1. Pre-ulcerative calluses   2. Type 2 diabetes mellitus without complication, without long-term current use of insulin ([place])          Plan:  Patient was evaluated and treated and all questions answered. Ulcer right fifth toe- healed.  -Debridement of hyperkeratatotic tissue with healed ulcer underlying -Dressed with neosporin, DSD. -Discussed glucose control and proper protein-rich diet.  -Discussed if any worsening redness, pain, fever or chills to call or may need to report to the emergency room. Patient expressed understanding.  -follow-up in 4 weeks for recheck   No follow-ups on file.  No follow-ups on file.   Lorenda Peck, DPM

## 2021-07-23 LAB — CUP PACEART REMOTE DEVICE CHECK
Date Time Interrogation Session: 20230617101336
Implantable Pulse Generator Implant Date: 20230407

## 2021-07-27 IMAGING — DX DG KNEE 1-2V PORT*L*
2 series · 2 of 2 positions shown · non-contrast
Comparison: Radiographic examination left knee February 27, 2019 and
left knee MRI March 04, 2019

CLINICAL DATA: Status post total knee arthroplasty

EXAM:
PORTABLE LEFT KNEE - 1-2 VIEW

[knee ap]
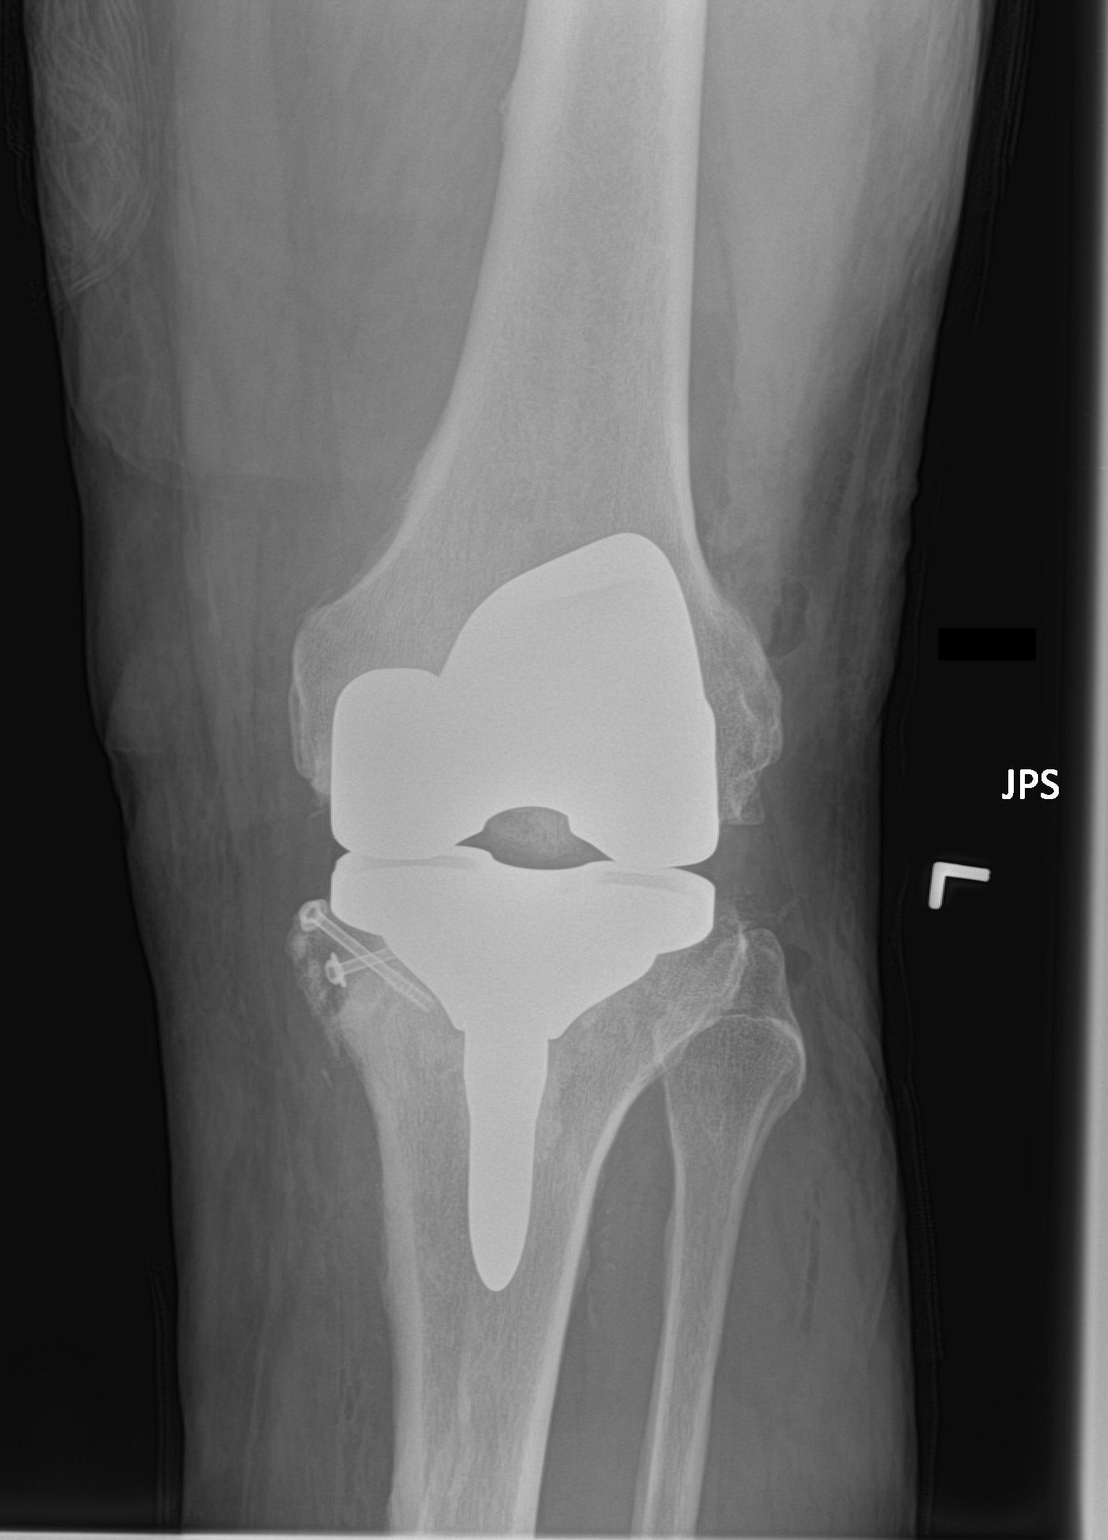

[knee lat]
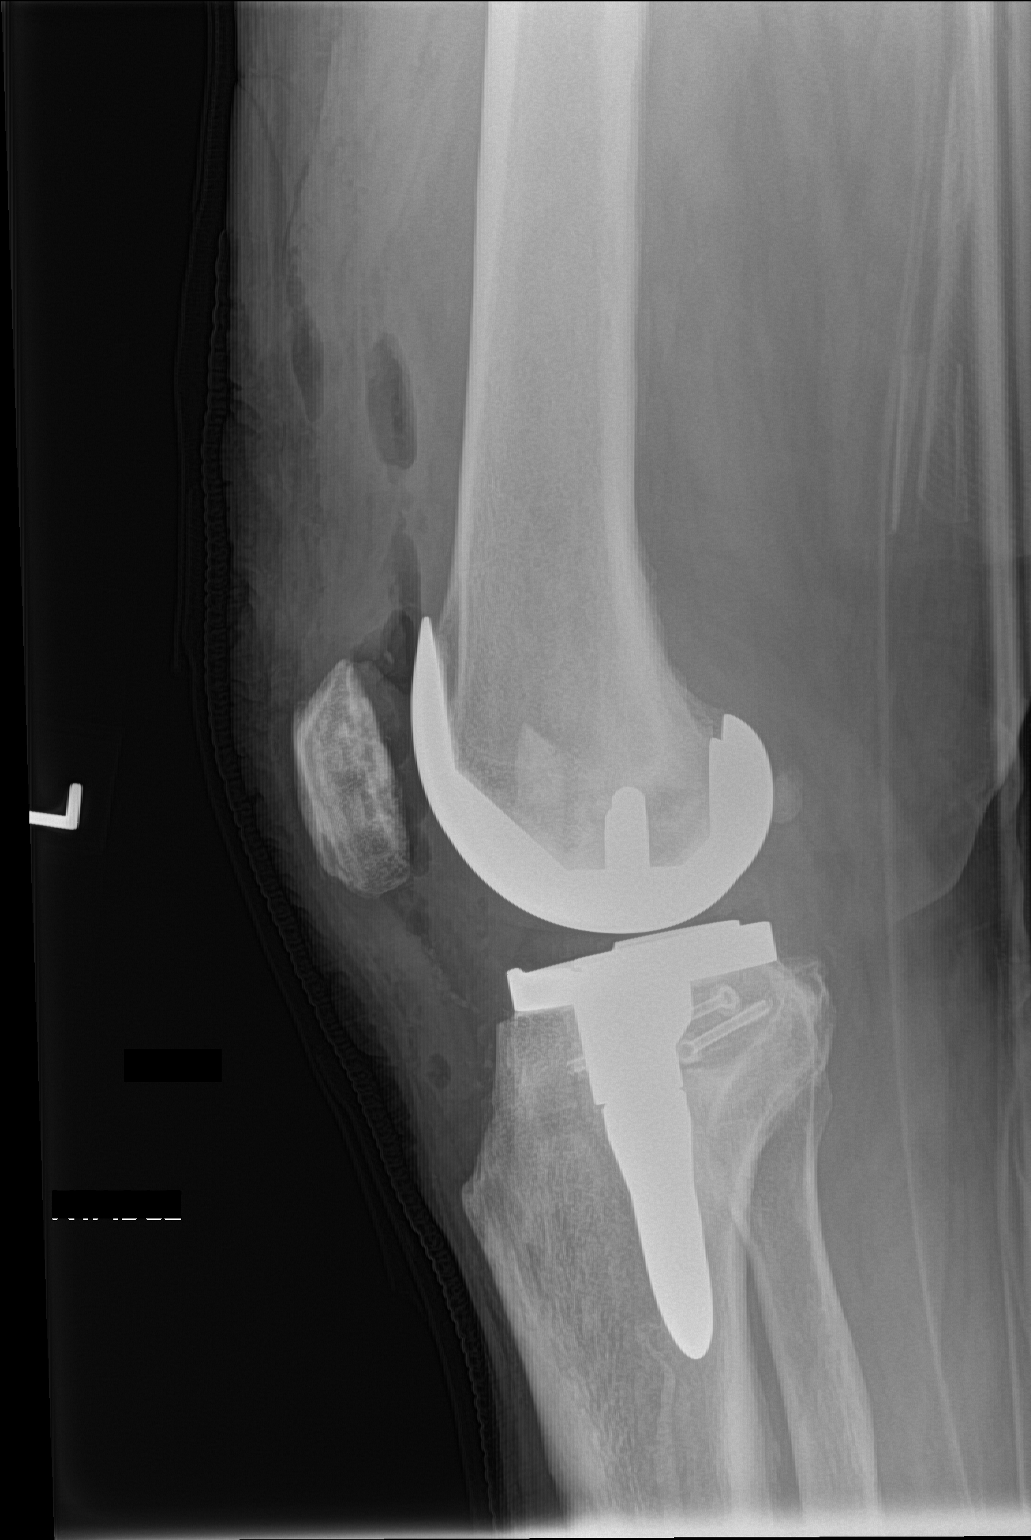

[2 of 2 positions shown; findings below may reference images not displayed]

FINDINGS: Frontal and lateral views were obtained. Patient is status post
total knee arthroplasty with femoral and tibial prosthetic
components well-seated. There are screws transfixing a prior
fracture the medial aspect of the medial tibial plateau with
alignment in this area is essentially anatomic. There is lucency in
this area of postoperative change in the medial tibial plateau
region. No acute fracture or dislocation. No erosive change. There
is intra-articular air and fluid consistent with recent surgery.
IMPRESSION: Prosthetic components well-seated. Spurs and place in the medial
aspect of the medial tibial plateau with evidence of prior fracture
in this area and anatomic alignment. There is relative lucency in
the medial aspect medial tibial plateau at the site of screw
fixation. From an imaging standpoint, chronic osteomyelitis is
difficult to exclude in this area. There is no acute fracture or
dislocation. Postoperative changes noted.

## 2021-07-28 DIAGNOSIS — H53461 Homonymous bilateral field defects, right side: Secondary | ICD-10-CM | POA: Diagnosis not present

## 2021-07-28 DIAGNOSIS — E113313 Type 2 diabetes mellitus with moderate nonproliferative diabetic retinopathy with macular edema, bilateral: Secondary | ICD-10-CM | POA: Diagnosis not present

## 2021-07-28 DIAGNOSIS — H25812 Combined forms of age-related cataract, left eye: Secondary | ICD-10-CM | POA: Diagnosis not present

## 2021-08-12 NOTE — Progress Notes (Signed)
[facility] Summary Report / Loop Recorder 

## 2021-08-15 ENCOUNTER — Other Ambulatory Visit: Payer: Self-pay | Admitting: Registered Nurse

## 2021-08-18 ENCOUNTER — Other Ambulatory Visit: Payer: Self-pay | Admitting: Registered Nurse

## 2021-08-19 ENCOUNTER — Other Ambulatory Visit: Payer: Self-pay | Admitting: Registered Nurse

## 2021-08-19 ENCOUNTER — Ambulatory Visit: Payer: Medicare Other | Admitting: Podiatry

## 2021-08-19 ENCOUNTER — Other Ambulatory Visit: Payer: Self-pay | Admitting: Cardiology

## 2021-08-19 DIAGNOSIS — E119 Type 2 diabetes mellitus without complications: Secondary | ICD-10-CM

## 2021-08-19 DIAGNOSIS — I10 Essential (primary) hypertension: Secondary | ICD-10-CM

## 2021-08-19 DIAGNOSIS — I639 Cerebral infarction, unspecified: Secondary | ICD-10-CM

## 2021-08-19 DIAGNOSIS — H25812 Combined forms of age-related cataract, left eye: Secondary | ICD-10-CM | POA: Diagnosis not present

## 2021-08-25 ENCOUNTER — Ambulatory Visit (INDEPENDENT_AMBULATORY_CARE_PROVIDER_SITE_OTHER): Payer: Medicare Other

## 2021-08-25 DIAGNOSIS — I639 Cerebral infarction, unspecified: Secondary | ICD-10-CM

## 2021-08-26 LAB — CUP PACEART REMOTE DEVICE CHECK
Date Time Interrogation Session: 20230720101505
Implantable Pulse Generator Implant Date: 20230407

## 2021-08-29 DIAGNOSIS — H2511 Age-related nuclear cataract, right eye: Secondary | ICD-10-CM | POA: Diagnosis not present

## 2021-08-31 NOTE — Progress Notes (Unsigned)
Cardiology Office Note:    Date:  09/01/2021   ID:  Joe Stewart, DOB 1955/11/17, MRN [id number]  PCP:  [name], NP   [facility] Providers Cardiologist:  Werner Lean, MD     Referring MD: [name], NP   CC: F/u stroke prevention  History of Present Illness:    Joe Stewart is a 66 y.o. male with a hx of Moderate not obstructive CAD, CKD Stage IIIa with DM, HTN and Familial tryglceridemia, prior stroke (Left PCA infarct)Seen by Dr. Rex Kras who presents 04/25/21 for care. 2023: s/p ILR.  Negative Lp(a).  LDL 55.  Patient notes that he is doing good.   Since last visit [place] the [place] .  [place] are no interval hospital/ED visit.    Since our last visit was doing up to three [place] around the park.  Last one months ago.  No symptoms.  No chest pain or pressure .  No SOB/DOE and no PND/Orthopnea.  No weight gain or leg swelling.  No palpitations or syncope.  Ambulatory blood pressure 130-154.   Past Medical History:  Diagnosis Date   Arthritis    Cataract    Mixed OU   Diabetes mellitus without complication ([place])    Diabetic retinopathy ([place])    NPDR OU   Hyperlipidemia    Hypertension    Hypertensive retinopathy    OU   Obesity    Stroke [facility])     Past Surgical History:  Procedure Laterality Date   BIOPSY  11/29/2019   Procedure: BIOPSY;  Surgeon: Irving Copas., MD;  Location: [facility];  Service: Gastroenterology;;   COLONOSCOPY WITH PROPOFOL N/A 11/29/2019   Procedure: COLONOSCOPY WITH PROPOFOL;  Surgeon: Irving Copas., MD;  Location: [facility];  Service: Gastroenterology;  Laterality: N/A;   ENDOSCOPIC MUCOSAL RESECTION N/A 11/29/2019   Procedure: ENDOSCOPIC MUCOSAL RESECTION;  Surgeon: Rush Landmark Telford Nab., MD;  Location: [facility];  Service: Gastroenterology;  Laterality: N/A;   ESOPHAGOGASTRODUODENOSCOPY (EGD) WITH  PROPOFOL N/A 11/29/2019   Procedure: ESOPHAGOGASTRODUODENOSCOPY (EGD) WITH PROPOFOL;  Surgeon: Rush Landmark Telford Nab., MD;  Location: [facility];  Service: Gastroenterology;  Laterality: N/A;   EYE SURGERY Left    had to have eye flushed after getting costic sodium in L eye   HEMOSTASIS CLIP PLACEMENT  11/29/2019   Procedure: HEMOSTASIS CLIP PLACEMENT;  Surgeon: Irving Copas., MD;  Location: [facility];  Service: Gastroenterology;;   HERNIA REPAIR     HERNIA REPAIR     35 years ago   HOT HEMOSTASIS N/A 11/29/2019   Procedure: HOT HEMOSTASIS (ARGON PLASMA COAGULATION/BICAP);  Surgeon: Irving Copas., MD;  Location: [facility];  Service: Gastroenterology;  Laterality: N/A;   LEFT HEART CATH AND CORONARY ANGIOGRAPHY N/A 04/11/2019   Procedure: LEFT HEART CATH AND CORONARY ANGIOGRAPHY;  Surgeon: Adrian Prows, MD;  Location: [place] [facility];  Service: Cardiovascular;  Laterality: N/A;   POLYPECTOMY  11/29/2019   Procedure: POLYPECTOMY;  Surgeon: Rush Landmark Telford Nab., MD;  Location: [facility];  Service: Gastroenterology;;   RENAL ANGIOGRAPHY Bilateral 04/11/2019   Procedure: RENAL ANGIOGRAPHY;  Surgeon: Adrian Prows, MD;  Location: [place] [facility];  Service: Cardiovascular;  Laterality: Bilateral;   SUBMUCOSAL LIFTING INJECTION  11/29/2019   Procedure: SUBMUCOSAL LIFTING INJECTION;  Surgeon: Irving Copas., MD;  Location: [facility];  Service: Gastroenterology;;   TOTAL KNEE ARTHROPLASTY Left 08/01/2019  Procedure: LEFT TOTAL KNEE ARTHROPLASTY;  Surgeon: Meredith Pel, MD;  Location: [place];  Service: Orthopedics;  Laterality: Left;    Current Medications: Current Meds  Medication Sig   amLODipine (NORVASC) 10 MG tablet TAKE 1 TABLET BY MOUTH EVERY DAY   aspirin EC 81 MG tablet Take 1 tablet (81 mg total) by mouth daily. Swallow whole.   atorvastatin (LIPITOR) 80 MG tablet TAKE 1 TABLET BY MOUTH EVERY DAY   blood glucose meter kit and supplies KIT  Dispense based on patient and insurance preference. Use up to four times daily as directed.   clonazePAM (KLONOPIN) 0.25 MG disintegrating tablet Take 1 tablet (0.25 mg total) by mouth 2 (two) times daily. (Patient taking differently: Take 0.25 mg by mouth as needed.)   Continuous Blood Gluc Sensor (DEXCOM G6 SENSOR) MISC Apply one every 10 days per package instructions   Continuous Blood Gluc Transmit (DEXCOM G6 TRANSMITTER) MISC 1 each by Does not apply route every 3 (three) months.   hydrochlorothiazide (HYDRODIURIL) 25 MG tablet Take 1 tablet (25 mg total) by mouth daily.   JARDIANCE 25 MG TABS tablet TAKE 1 TABLET BY MOUTH EVERY DAY BEFORE BREAKFAST   losartan (COZAAR) 50 MG tablet TAKE 1 TABLET (50 MG TOTAL) BY MOUTH DAILY AT 10 PM.   metoprolol succinate (TOPROL-XL) 50 MG 24 hr tablet Take 1 tablet (50 mg total) by mouth daily. Take with or immediately following a meal.   Prednisol Ace-Moxiflox-Bromfen 1-0.5-0.075 % SUSP Place 1 drop into the left eye 3 (three) times daily.   [DISCONTINUED] hydrochlorothiazide (HYDRODIURIL) 12.5 MG tablet TAKE 1 TABLET BY MOUTH EVERY DAY     Allergies:   Patient has no known allergies.   Social History   Socioeconomic History   Marital status: Single    Spouse name: Not on file   Number of children: 1   Years of education: Not on file   Highest education level: Not on file  Occupational History   Not on file  Tobacco Use   Smoking status: Never   Smokeless tobacco: Never  Vaping Use   Vaping Use: Never used  Substance and Sexual Activity   Alcohol use: Not Currently   Drug use: Never   Sexual activity: Not Currently  Other Topics Concern   Not on file  Social History Narrative   Not on file   Social Determinants of Health   Financial Resource Strain: Low Risk  (05/28/2021)   Overall Financial Resource Strain (CARDIA)    Difficulty of Paying Living Expenses: Not hard at all  Food Insecurity: No Food Insecurity (05/28/2021)   Hunger  Vital Sign    Worried About Running Out of Food in the Last Year: Never true    Ran Out of Food in the Last Year: Never true  Transportation Needs: No Transportation Needs (05/28/2021)   PRAPARE - Hydrologist (Medical): No    Lack of Transportation (Non-Medical): No  Physical Activity: Inactive (05/28/2021)   Exercise Vital Sign    Days of Exercise per Week: 0 days    Minutes of Exercise per Session: 0 min  Stress: No Stress Concern Present (05/28/2021)   [place]    Feeling of Stress : Only a little  Social Connections: Moderately Isolated (05/28/2021)   Social Connection and Isolation Panel [NHANES]    Frequency of Communication with Friends and Family: Three times a week    Frequency of  Social Gatherings with Friends and Family: Three times a week    Attends Religious Services: More than 4 times per year    Active Member of Clubs or Organizations: No    Attends Club or Organization Meetings: Never    Marital Status: Divorced     Family History: The patient's family history includes COPD in his mother and sister; Cancer in his mother; Diabetes in his mother; Healthy in his brother and son; Heart disease in his brother and father. There is no history of Colon cancer, Colon polyps, Esophageal cancer, Rectal cancer, Stomach cancer, Inflammatory bowel disease, Liver disease, or Pancreatic cancer.  ROS:   Please see the history of present illness.     All other systems reviewed and are negative.  EKGs/Labs/Other Studies Reviewed:    The following studies were reviewed today:  Recent Labs: 04/09/2021: Magnesium 1.8 04/15/2021: Hemoglobin 13.1; Platelets 229.0; TSH 1.55 06/02/2021: BUN 31; Creatinine, Ser 1.22; Potassium 4.1; Sodium 137 06/25/2021: ALT 17  Recent Lipid Panel    Component Value Date/Time   CHOL 110 06/25/2021 1003   TRIG 115 06/25/2021 1003   HDL 34 (L) 06/25/2021 1003    CHOLHDL 3.2 06/25/2021 1003   CHOLHDL 3 04/15/2021 1425   VLDL 42.4 (H) 04/15/2021 1425   LDLCALC 55 06/25/2021 1003   LDLDIRECT 55.0 04/15/2021 1425       Physical Exam:    VS:  BP (!) 144/70   Pulse 78   Ht 5' 8" (1.727 m)   Wt 242 lb (109.8 kg)   SpO2 99%   BMI 36.80 kg/m     Wt Readings from Last 3 Encounters:  09/01/21 242 lb (109.8 kg)  06/02/21 240 lb 1.6 oz (108.9 kg)  05/09/21 241 lb 12.8 oz (109.7 kg)    Gen: no distress, morbid obesity   Neck: No JVD,  Ears: R ear Frank Sign Cardiac: No Rubs or Gallops, no murmur, RRR +2 radial pulses Respiratory: Clear to auscultation bilaterally, normal effort, normal  respiratory rate GI: Soft, nontender, non-distended  MS: +1 bilateral pitting  edema;  moves all extremities Integument: Skin feels warm Neuro:  At time of evaluation, alert and oriented to person/place/time/situation  Psych: Normal affect, patient feels well   ASSESSMENT:    1. Essential hypertension   2. Cryptogenic stroke (HCC)   3. Familial hypercholesterolemia   4. Leg edema   5. Coronary artery disease involving native coronary artery of native heart without angina pectoris     PLAN:    HTN LE Edema Cryptogenic stroke Coronary Artery Disease; non obstructive FH- Triglycerides DM and CKD stage IIIa - continue his current medications with the exception of increasing his HCTZ to 25 mg PO daily, we will repeat his BMP in 1-2 weeks.  Based on results we could attempt increase ARB; if he does not tolerate higher diuretic dose will back off - continue ambulatory BP monitoring - we discussed decreasing salt and processed food for BP and for LE swelling - s/p ILR - continue ASA 81 mg - continue statin, goal LDL < 55        Medication Adjustments/Labs and Tests Ordered: Current medicines are reviewed at length with the patient today.  Concerns regarding medicines are outlined above.  Orders Placed This Encounter  Procedures   Basic metabolic  panel   Meds ordered this encounter  Medications   hydrochlorothiazide (HYDRODIURIL) 25 MG tablet    Sig: Take 1 tablet (25 mg total) by mouth daily.      Dispense:  90 tablet    Refill:  3    Patient Instructions  Medication Instructions:  Your physician has recommended you make the following change in your medication:  INCREASE: hydrochlorothiazide (hydrodiuril) to 25 mg by mouth once daily  *If you need a refill on your cardiac medications before your next appointment, please call your pharmacy*   Lab Work: IN 1-2 WEEKS: BMP If you have labs (blood work) drawn today and your tests are completely normal, you will receive your results only by: MyChart Message (if you have MyChart) OR A paper copy in the mail If you have any lab test that is abnormal or we need to change your treatment, we will call you to review the results.   Testing/Procedures: NONE   Follow-Up: At [facility], you and your health needs are our priority.  As part of our continuing mission to provide you with exceptional heart care, we have created designated Provider Care Teams.  These Care Teams include your primary Cardiologist (physician) and Advanced Practice Providers (APPs -  Physician Assistants and Nurse Practitioners) who all work together to provide you with the care you need, when you need it.  Your next appointment:   1 year(s)  The format for your next appointment:   In Person  Provider:   Mahesh A Chandrasekhar, MD   Important Information About Sugar         Signed, Mahesh A Chandrasekhar, MD  09/01/2021 10:07 AM    [facility] 

## 2021-09-01 ENCOUNTER — Ambulatory Visit: Payer: Medicare Other | Admitting: Internal Medicine

## 2021-09-01 ENCOUNTER — Encounter: Payer: Self-pay | Admitting: Internal Medicine

## 2021-09-01 VITALS — BP 144/70 | HR 78 | Ht 68.0 in | Wt 242.0 lb

## 2021-09-01 DIAGNOSIS — R600 Localized edema: Secondary | ICD-10-CM | POA: Diagnosis not present

## 2021-09-01 DIAGNOSIS — I2510 Atherosclerotic heart disease of native coronary artery without angina pectoris: Secondary | ICD-10-CM | POA: Diagnosis not present

## 2021-09-01 DIAGNOSIS — I639 Cerebral infarction, unspecified: Secondary | ICD-10-CM | POA: Diagnosis not present

## 2021-09-01 DIAGNOSIS — E7801 Familial hypercholesterolemia: Secondary | ICD-10-CM | POA: Diagnosis not present

## 2021-09-01 DIAGNOSIS — I10 Essential (primary) hypertension: Secondary | ICD-10-CM | POA: Diagnosis not present

## 2021-09-01 MED ORDER — HYDROCHLOROTHIAZIDE 25 MG PO TABS: 25.0000 mg | ORAL_TABLET | Freq: Every day | ORAL | 3 refills | Status: DC

## 2021-09-01 NOTE — Patient Instructions (Signed)
Medication Instructions:  Your physician has recommended you make the following change in your medication:  INCREASE: hydrochlorothiazide (hydrodiuril) to 25 mg by mouth once daily  *If you need a refill on your cardiac medications before your next appointment, please call your pharmacy*   Lab Work: IN 1-2 WEEKS: BMP If you have labs (blood work) drawn today and your tests are completely normal, you will receive your results only by: [place] (if you have MyChart) OR A paper copy in the mail If you have any lab test that is abnormal or we need to change your treatment, we will call you to review the results.   Testing/Procedures: NONE   Follow-Up: At [facility], you and your health needs are our priority.  As part of our continuing mission to provide you with exceptional heart care, we have created designated Provider Care Teams.  These Care Teams include your primary Cardiologist (physician) and Advanced Practice Providers (APPs -  Physician Assistants and Nurse Practitioners) who all work together to provide you with the care you need, when you need it.  Your next appointment:   1 year(s)  The format for your next appointment:   In Person  Provider:   Werner Lean, MD   Important Information About Sugar

## 2021-09-02 ENCOUNTER — Ambulatory Visit: Payer: Medicare Other | Admitting: Registered Nurse

## 2021-09-02 DIAGNOSIS — H25811 Combined forms of age-related cataract, right eye: Secondary | ICD-10-CM | POA: Diagnosis not present

## 2021-09-05 ENCOUNTER — Encounter: Payer: Self-pay | Admitting: Family Medicine

## 2021-09-05 ENCOUNTER — Ambulatory Visit (INDEPENDENT_AMBULATORY_CARE_PROVIDER_SITE_OTHER): Payer: Medicare Other | Admitting: Family Medicine

## 2021-09-05 VITALS — BP 136/80 | HR 97 | Temp 97.5°F | Resp 16 | Ht 68.0 in | Wt 240.0 lb

## 2021-09-05 DIAGNOSIS — E7801 Familial hypercholesterolemia: Secondary | ICD-10-CM | POA: Diagnosis not present

## 2021-09-05 DIAGNOSIS — I10 Essential (primary) hypertension: Secondary | ICD-10-CM

## 2021-09-05 DIAGNOSIS — E119 Type 2 diabetes mellitus without complications: Secondary | ICD-10-CM | POA: Diagnosis not present

## 2021-09-05 LAB — HEPATIC FUNCTION PANEL
ALT: 19 U/L (ref 0–53)
AST: 19 U/L (ref 0–37)
Albumin: 4.6 g/dL (ref 3.5–5.2)
Alkaline Phosphatase: 71 U/L (ref 39–117)
Bilirubin, Direct: 0.1 mg/dL (ref 0.0–0.3)
Total Bilirubin: 0.6 mg/dL (ref 0.2–1.2)
Total Protein: 8.3 g/dL (ref 6.0–8.3)

## 2021-09-05 LAB — LIPID PANEL
Cholesterol: 140 mg/dL (ref 0–200)
HDL: 36.7 mg/dL — ABNORMAL LOW (ref >39.00)
LDL Cholesterol: 71 mg/dL (ref 0–99)
NonHDL: 103.02
Total CHOL/HDL Ratio: 4
Triglycerides: 162 mg/dL — ABNORMAL HIGH (ref 0.0–149.0)
VLDL: 32.4 mg/dL (ref 0.0–40.0)

## 2021-09-05 LAB — CBC WITH DIFFERENTIAL/PLATELET
Basophils Absolute: 0.1 10*3/uL (ref 0.0–0.1)
Basophils Relative: 1.2 % (ref 0.0–3.0)
Eosinophils Absolute: 0.1 10*3/uL (ref 0.0–0.7)
Eosinophils Relative: 1.8 % (ref 0.0–5.0)
HCT: 43.6 % (ref 39.0–52.0)
Hemoglobin: 14.4 g/dL (ref 13.0–17.0)
Lymphocytes Relative: 31.2 % (ref 12.0–46.0)
Lymphs Abs: 2.4 10*3/uL (ref 0.7–4.0)
MCHC: 33 g/dL (ref 30.0–36.0)
MCV: 93.3 fl (ref 78.0–100.0)
Monocytes Absolute: 0.4 10*3/uL (ref 0.1–1.0)
Monocytes Relative: 5.1 % (ref 3.0–12.0)
Neutro Abs: 4.7 10*3/uL (ref 1.4–7.7)
Neutrophils Relative %: 60.7 % (ref 43.0–77.0)
Platelets: 210 10*3/uL (ref 150.0–400.0)
RBC: 4.68 Mil/uL (ref 4.22–5.81)
RDW: 13.8 % (ref 11.5–15.5)
WBC: 7.7 10*3/uL (ref 4.0–10.5)

## 2021-09-05 LAB — BASIC METABOLIC PANEL
BUN: 27 mg/dL — ABNORMAL HIGH (ref 6–23)
CO2: 29 mEq/L (ref 19–32)
Calcium: 9.9 mg/dL (ref 8.4–10.5)
Chloride: 101 mEq/L (ref 96–112)
Creatinine, Ser: 1.23 mg/dL (ref 0.40–1.50)
GFR: 60.54 mL/min (ref >60.00)
Glucose, Bld: 180 mg/dL — ABNORMAL HIGH (ref 70–99)
Potassium: 4.4 mEq/L (ref 3.5–5.1)
Sodium: 137 mEq/L (ref 135–145)

## 2021-09-05 LAB — HEMOGLOBIN A1C: Hgb A1c MFr Bld: 8.6 % — ABNORMAL HIGH (ref 4.6–6.5)

## 2021-09-05 NOTE — Progress Notes (Signed)
   Subjective:    Patient ID: Joe Stewart, male    DOB: Apr 06, 1955, 66 y.o.   MRN: [id number]  HPI HTN- chronic problem, on Amlodipine '10mg'$  daily, HCTZ '25mg'$  daily, Losartan '50mg'$  daily, Metoprolol XL '50mg'$  daily w/ adequate control.  No CP, SOB, HAs, visual changes, edema.  Hyperlipidemia- chronic problem, on Lipitor '80mg'$  daily.  No abd pain, N/V.  DM- chronic problem, on Jardiance '25mg'$  daily.  On ARB for renal protection.  Pt reports recent cataract surgery Joe Stewart), June exam w/ Dr Joe Stewart.  UTD on foot exam.  Pt reports feeling well.  Denies recent symptomatic lows.  + tingling in feet bilaterally   Review of Systems For ROS see HPI     Objective:   Physical Exam Vitals reviewed.  Constitutional:      General: He is not in acute distress.    Appearance: Normal appearance. He is well-developed. He is obese. He is not ill-appearing.  HENT:     Head: Normocephalic and atraumatic.  Eyes:     Extraocular Movements: Extraocular movements intact.     Conjunctiva/sclera: Conjunctivae normal.     Pupils: Pupils are equal, round, and reactive to light.  Neck:     Thyroid: No thyromegaly.  Cardiovascular:     Rate and Rhythm: Normal rate and regular rhythm.     Pulses: Normal pulses.     Heart sounds: Normal heart sounds. No murmur heard. Pulmonary:     Effort: Pulmonary effort is normal. No respiratory distress.     Breath sounds: Normal breath sounds.  Abdominal:     General: Bowel sounds are normal. There is no distension.     Palpations: Abdomen is soft.  Musculoskeletal:     Cervical back: Normal range of motion and neck supple.     Right lower leg: No edema.     Left lower leg: No edema.  Lymphadenopathy:     Cervical: No cervical adenopathy.  Skin:    General: Skin is warm and dry.  Neurological:     General: No focal deficit present.     Mental Status: He is alert and oriented to person, place, and time.     Cranial Nerves: No cranial nerve deficit.  Psychiatric:         Mood and Affect: Mood normal.        Behavior: Behavior normal.           Assessment & Plan:

## 2021-09-05 NOTE — Patient Instructions (Signed)
Follow up in 3-4 months w/ new provider to recheck diabetes We'll notify you of your lab results and make any changes if needed Continue to work on low sugar/low carb diet and regular exercise Call with any questions or concerns Stay Safe!  Stay Healthy! HAPPY EARLY BIRTHDAY!!!

## 2021-09-05 NOTE — Assessment & Plan Note (Signed)
Chronic problem, on Amlodipine '10mg'$  daily, HCTZ '25mg'$  daily, Losartan '50mg'$  daily, and Metoprolol XL '50mg'$  daily w/ adequate control.  Currently asymptomatic.  Check labs due to ARB and HCTZ but no anticipated med changes.

## 2021-09-05 NOTE — Assessment & Plan Note (Signed)
Chronic problem.  UTD on foot exam, eye exam, microalbumin.  Pt states he is feeling well.  No recent symptomatic lows.  Does have some tingling of feet bilaterally.  Check labs.  Adjust meds prn

## 2021-09-08 LAB — TSH: TSH: 2.6 u[IU]/mL (ref 0.35–5.50)

## 2021-09-08 NOTE — Progress Notes (Signed)
Pt seen results via my chart  

## 2021-09-09 ENCOUNTER — Ambulatory Visit: Payer: Medicare Other | Admitting: Podiatry

## 2021-09-09 ENCOUNTER — Encounter: Payer: Self-pay | Admitting: Podiatry

## 2021-09-09 DIAGNOSIS — E119 Type 2 diabetes mellitus without complications: Secondary | ICD-10-CM

## 2021-09-09 DIAGNOSIS — L84 Corns and callosities: Secondary | ICD-10-CM

## 2021-09-09 NOTE — Progress Notes (Signed)
  Subjective:  Patient ID: Joe Stewart, male    DOB: [birth year],   MRN: [id number]  Chief Complaint  Patient presents with   Wound Check    Patient states would is getting better since the last visit healing is just taking time     66 y.o. male presents for follow-up of right foot wound. Relates it seems still be doing well.   No pain He is diabetic and last A1c was 7.4  . Denies any other pedal complaints. Denies n/v/f/c.   PCP: [name] MD   Past Medical History:  Diagnosis Date   Arthritis    Cataract    Mixed OU   Diabetes mellitus without complication ([place])    Diabetic retinopathy ([place])    NPDR OU   Hyperlipidemia    Hypertension    Hypertensive retinopathy    OU   Obesity    Stroke [facility])     Objective:  Physical Exam: Vascular: DP/PT pulses 2/4 bilateral. CFT <3 seconds. Normal hair growth on digits. No edema.  Skin. No lacerations or abrasions bilateral feet. Right fifth lateral digit with wound healed. Undelrying hyperkeratotic tissue Some erythema and edema noted.  Musculoskeletal: MMT 5/5 bilateral lower extremities in DF, PF, Inversion and Eversion. Deceased ROM in DF of ankle joint.  Neurological: Sensation intact to light touch.   Assessment:   1. Pre-ulcerative calluses   2. Type 2 diabetes mellitus without complication, without long-term current use of insulin ([place])           Plan:  Patient was evaluated and treated and all questions answered. Ulcer right fifth toe- healed.  -Debridement of hyperkeratatotic tissue with healed ulcer underlying but very close to opening.  -Will keep toe cap over area for protection.  -Discussed glucose control and proper protein-rich diet.  -Discussed if any worsening redness, pain, fever or chills to call or may need to report to the emergency room. Patient expressed understanding.  -follow-up in 6 weeks for recheck and rfc.   Return in about 6 weeks (around 10/21/2021) for wound check.  Return in about 6  weeks (around 10/21/2021) for wound check.   Lorenda Peck, DPM

## 2021-09-16 ENCOUNTER — Other Ambulatory Visit: Payer: Medicare Other

## 2021-09-16 DIAGNOSIS — I10 Essential (primary) hypertension: Secondary | ICD-10-CM

## 2021-09-16 LAB — BASIC METABOLIC PANEL
BUN/Creatinine Ratio: 22 (ref 10–24)
BUN: 31 mg/dL — ABNORMAL HIGH (ref 8–27)
CO2: 22 mmol/L (ref 20–29)
Calcium: 9.9 mg/dL (ref 8.6–10.2)
Chloride: 102 mmol/L (ref 96–106)
Creatinine, Ser: 1.38 mg/dL — ABNORMAL HIGH (ref 0.76–1.27)
Glucose: 168 mg/dL — ABNORMAL HIGH (ref 70–99)
Potassium: 5.2 mmol/L (ref 3.5–5.2)
Sodium: 140 mmol/L (ref 134–144)
eGFR: 56 mL/min/{1.73_m2} — ABNORMAL LOW (ref >59)

## 2021-09-25 NOTE — Progress Notes (Signed)
[facility] Note  10/07/2021    CHIEF COMPLAINT Patient presents for Retina Follow Up   HISTORY OF PRESENT ILLNESS: Joe Stewart is a 66 y.o. male who presents to the clinic today for:  HPI     Retina Follow Up   Patient presents with  Diabetic Retinopathy.  In both eyes.  This started months ago.  Duration of 3 months.  Since onset it is stable.  I, the attending physician,  performed the HPI with the patient and updated documentation appropriately.        Comments   Patient feels that the vision has improved. He admits to seeing floaters in the right eye. He is unsure of his blood sugar and A1c.       Last edited by Bernarda Caffey, MD on 10/07/2021 12:08 PM.    Pt has had cataract surgery with Dr. Katy Fitch since he was here last, he states his vision is improved  Referring physician: Shirleen Schirmer, [address] [place], [place] [address]  HISTORICAL INFORMATION:   Selected notes from the MEDICAL RECORD NUMBER Referred by [name], PA LEE: 09.15.21 BCVA OD: 20/20 OS: 20/20 Ocular Hx- NPDR with edema  PMH- DM, HTN   CURRENT MEDICATIONS: Current Outpatient Medications (Ophthalmic Drugs)  Medication Sig   Prednisol Ace-Moxiflox-Bromfen 1-0.5-0.075 % SUSP Place 1 drop into the left eye 3 (three) times daily.   No current facility-administered medications for this visit. (Ophthalmic Drugs)   Current Outpatient Medications (Other)  Medication Sig   amLODipine (NORVASC) 10 MG tablet TAKE 1 TABLET BY MOUTH EVERY DAY   aspirin EC 81 MG tablet Take 1 tablet (81 mg total) by mouth daily. Swallow whole.   atorvastatin (LIPITOR) 80 MG tablet TAKE 1 TABLET BY MOUTH EVERY DAY   blood glucose meter kit and supplies KIT Dispense based on patient and insurance preference. Use up to four times daily as directed.   clonazePAM (KLONOPIN) 0.25 MG disintegrating tablet Take 1 tablet (0.25 mg total) by mouth 2 (two) times daily. (Patient taking  differently: Take 0.25 mg by mouth as needed.)   Continuous Blood Gluc Sensor (DEXCOM G6 SENSOR) MISC Apply one every 10 days per package instructions   Continuous Blood Gluc Transmit (DEXCOM G6 TRANSMITTER) MISC 1 each by Does not apply route every 3 (three) months.   hydrochlorothiazide (HYDRODIURIL) 25 MG tablet Take 1 tablet (25 mg total) by mouth daily.   JARDIANCE 25 MG TABS tablet TAKE 1 TABLET BY MOUTH EVERY DAY BEFORE BREAKFAST   losartan (COZAAR) 50 MG tablet TAKE 1 TABLET (50 MG TOTAL) BY MOUTH DAILY AT 10 PM.   metoprolol succinate (TOPROL-XL) 50 MG 24 hr tablet Take 1 tablet (50 mg total) by mouth daily. Take with or immediately following a meal.   No current facility-administered medications for this visit. (Other)   REVIEW OF SYSTEMS: ROS   Positive for: Gastrointestinal, Neurological, Musculoskeletal, Endocrine, Eyes Negative for: Constitutional, Skin, Genitourinary, HENT, Cardiovascular, Respiratory, Psychiatric, Allergic/Imm, Heme/Lymph Last edited by Annie Paras, COT on 10/07/2021  9:02 AM.     ALLERGIES No Known Allergies  PAST MEDICAL HISTORY Past Medical History:  Diagnosis Date   Arthritis    Cataract    Mixed OU   Diabetes mellitus without complication ([place])    Diabetic retinopathy ([place])    NPDR OU   Hyperlipidemia    Hypertension    Hypertensive retinopathy    OU   Obesity  Stroke [facility])    Past Surgical History:  Procedure Laterality Date   BIOPSY  11/29/2019   Procedure: BIOPSY;  Surgeon: Rush Landmark Telford Nab., MD;  Location: [facility];  Service: Gastroenterology;;   COLONOSCOPY WITH PROPOFOL N/A 11/29/2019   Procedure: COLONOSCOPY WITH PROPOFOL;  Surgeon: Irving Copas., MD;  Location: [facility];  Service: Gastroenterology;  Laterality: N/A;   ENDOSCOPIC MUCOSAL RESECTION N/A 11/29/2019   Procedure: ENDOSCOPIC MUCOSAL RESECTION;  Surgeon: Rush Landmark Telford Nab., MD;  [facility]: [facility];  Service: Gastroenterology;   Laterality: N/A;   ESOPHAGOGASTRODUODENOSCOPY (EGD) WITH PROPOFOL N/A 11/29/2019   Procedure: ESOPHAGOGASTRODUODENOSCOPY (EGD) WITH PROPOFOL;  Surgeon: Rush Landmark Telford Nab., MD;  [facility]: [facility];  Service: Gastroenterology;  Laterality: N/A;   EYE SURGERY Left    had to have eye flushed after getting costic sodium in L eye   HEMOSTASIS CLIP PLACEMENT  11/29/2019   Procedure: HEMOSTASIS CLIP PLACEMENT;  Surgeon: Irving Copas., MD;  [facility];  Service: Gastroenterology;;   HERNIA REPAIR     HERNIA REPAIR     35 years ago   HOT HEMOSTASIS N/A 11/29/2019   Procedure: HOT HEMOSTASIS (ARGON PLASMA COAGULATION/BICAP);  Surgeon: Irving Copas., MD;  Location: [facility];  Service: Gastroenterology;  Laterality: N/A;   LEFT HEART CATH AND CORONARY ANGIOGRAPHY N/A 04/11/2019   Procedure: LEFT HEART CATH AND CORONARY ANGIOGRAPHY;  Surgeon: Adrian Prows, MD;  Location: [facility];  Service: Cardiovascular;  Laterality: N/A;   POLYPECTOMY  11/29/2019   Procedure: POLYPECTOMY;  Surgeon: Rush Landmark Telford Nab., MD;  Location: [facility];  Service: Gastroenterology;;   RENAL ANGIOGRAPHY Bilateral 04/11/2019   Procedure: RENAL ANGIOGRAPHY;  Surgeon: Adrian Prows, MD;  Location: [facility];  Service: Cardiovascular;  Laterality: Bilateral;   SUBMUCOSAL LIFTING INJECTION  11/29/2019   Procedure: SUBMUCOSAL LIFTING INJECTION;  Surgeon: Irving Copas., MD;  Location: [facility];  Service: Gastroenterology;;   TOTAL KNEE ARTHROPLASTY Left 08/01/2019   Procedure: LEFT TOTAL KNEE ARTHROPLASTY;  Surgeon: Meredith Pel, MD;  Location: [facility];  Service: Orthopedics;  Laterality: Left;   FAMILY HISTORY Family History  Problem Relation Age of Onset   Diabetes Mother    COPD Mother    Cancer Mother        unknown type    Heart disease Father    COPD Sister    Healthy Brother    Heart disease Brother    Healthy Son    Colon cancer Neg Hx     Colon polyps Neg Hx    Esophageal cancer Neg Hx    Rectal cancer Neg Hx    Stomach cancer Neg Hx    Inflammatory bowel disease Neg Hx    Liver disease Neg Hx    Pancreatic cancer Neg Hx    SOCIAL HISTORY Social History   Tobacco Use   Smoking status: Never   Smokeless tobacco: Never  Vaping Use   Vaping Use: Never used  Substance Use Topics   Alcohol use: Not Currently   Drug use: Never       OPHTHALMIC EXAM:  Base Eye Exam     Visual Acuity (Snellen - Linear)       Right Left   Dist [place] 20/25 +1 20/40   Dist ph [place] NI 20/25         Tonometry (Tonopen, 9:13 AM)       Right Left   Pressure 14 12  Pupils       Dark Light Shape React APD   Right 3 2 Round Brisk None   Left 3 2 Round Brisk None         Visual Fields       Left Right   Restrictions Total superior nasal deficiency Total superior temporal deficiency         Extraocular Movement       Right Left    Full, Ortho Full, Ortho         Neuro/Psych     Oriented x3: Yes   Mood/Affect: Normal         Dilation     Both eyes: 1.0% Mydriacyl, 2.5% Phenylephrine @ 9:04 AM           Slit Lamp and Fundus Exam     Slit Lamp Exam       Right Left   Lids/Lashes Dermatochalasis - upper lid, Meibomian gland dysfunction Dermatochalasis - upper lid, Meibomian gland dysfunction   Conjunctiva/Sclera White and quiet White and quiet   Cornea Trace inferior Punctate epithelial erosions, mild tear film debris, well healed cataract wound Mild tear film debris, 1+ Punctate epithelial erosions, well healed cataract wound   Anterior Chamber Deep and quiet, deep, clear, narrow temporal angle Deep and quiet   Iris Round and moderately dilated, No NVI Round and moderately dilated to 6.20m, No NVI, PPM nasal   Lens PC IOL in good position, trace Posterior capsular opacification PC IOL in good position   Anterior Vitreous Vitreous syneresis, mild vitreous condensations Vitreous syneresis          Fundus Exam       Right Left   Disc Pink and Sharp, PPA/PPP Pink and Sharp, +Peripapillary atrophy, Compact   C/D Ratio 0.2 0.1   Macula Flat, Blunted foveal reflex, scattered MA/IRH, RPE mottling and clumping, trace cystic changes - improved Flat, Blunted foveal reflex, rare MA, focal cluster of exudates and cystic changes superior macula -- improved   Vessels attenuated, mild tortuosity, mild AV crossing changes attenuated, mild tortuosity, mild AV crossing changes   Periphery Attached, pigmented cystoid degeneration ST periphery, scattered MA/IRH greatest posteriorly, focal pigmented VR tuft at 0800 Attached, scattered MA greatest posteriorly, focal pigmented CR scarring along distal ST arcades (0130)           Refraction     Manifest Refraction       Sphere Cylinder Dist VA   Right -1.00 Sphere 20/20   Left -1.25 Sphere 20/20            IMAGING AND PROCEDURES  Imaging and Procedures for 10/07/2021  OCT, Retina - OU - Both Eyes       Right Eye Quality was good. Central Foveal Thickness: 330. Progression has been stable. Findings include normal foveal contour, no SRF, intraretinal hyper-reflective material, intraretinal fluid (Trace cystic changes and IRHM SN mac, very shallow schisis ST periphery caught on widefield - not imaged today).   Left Eye Quality was good. Central Foveal Thickness: 323. Progression has improved. Findings include normal foveal contour, no SRF, intraretinal fluid, vitreomacular adhesion (Mild interval improvement in IRF/IRHM, focal cystic changes superior macula).   Notes *Images captured and stored on drive  Diagnosis / Impression:  DME OU OD: Trace cystic changes and IRHM SN mac, very shallow schisis ST periphery caught on widefield - not imaged today OS: Mild interval improvement in IRF/IRHM, focal cystic changes superior macula  Clinical management:  See below  Abbreviations:  NFP - Normal foveal profile. CME - cystoid macular  edema. PED - pigment epithelial detachment. IRF - intraretinal fluid. SRF - subretinal fluid. EZ - ellipsoid zone. ERM - epiretinal membrane. ORA - outer retinal atrophy. ORT - outer retinal tubulation. SRHM - subretinal hyper-reflective material. IRHM - intraretinal hyper-reflective material            ASSESSMENT/PLAN:    ICD-10-CM   1. Acute CVA (cerebrovascular accident) ([place])  I63.9     2. Visual field loss following cerebrovascular accident  I69.398    H54.7     3. Moderate nonproliferative diabetic retinopathy of both eyes with macular edema associated with type 2 diabetes mellitus (HCC)  E11.3313 OCT, Retina - OU - Both Eyes    4. Essential hypertension  I10     5. Hypertensive retinopathy of both eyes  H35.033     6. Pseudophakia, both eyes  Z96.1        1,2. 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  - BCVA improved to 20/25 OU -- post cataract sx  - baseline visual field obtained, 04.24.23 -- right homonymous superior quadrantanopia consistent w/ L PCA stroke  - f/u 4-6 month DFE, OCT  3. Moderate Non-proliferative diabetic retinopathy w/ DME OU  - pt lost to f/u from Mar 2022 to Feb 2023 (11 mos)  - S/P IVA OD #1 (10.26.21), #2 (11.24.21), #3 (12.22.21), #4 (03/06/20), #5 (03.23.22)  - S/P IVA OS #1 (03.23.22)             - A1c 7.4% 05.01.23, 8.3% on 03.14.23; 12.3% on 01.19.23, 8.3% on 12.03.21; 7.4% on 06.04.21 - BCVA 20/25 OU -- improved -- post cataract sugery  - exam shows scattered MA and mild edema/cystic changes OU - FA (09.29.21) shows late leaking MA OU; no NV OU  - OCT shows OD: Trace cystic changes and IRHM SN mac, very shallow schisis ST periphery caught on widefield - not imaged today; OS: Mild interval  improvement in IRF/IRHM, focal cystic changes superior macula  - no retinal intervention recommended today as DME remains fairly minimal despite 18+ mos since last shot  - f/u 4-6 months DFE, OCT  4,5. Hypertensive retinopathy OU - discussed importance of tight BP control - monitor  6. Pseudophakia OU  - s/p CE/IOL (Dr. Zenia Resides, ~August 2023)  - IOL in good position, doing well  - monitor   Ophthalmic Meds Ordered this visit:  No orders of the defined types were placed in this encounter.   Return for f/u 4-6 months, NPDR OU, DFE, OCT.  There are no Patient Instructions on file for this visit.  This document serves as a record of services personally performed by Gardiner Sleeper, MD, PhD. It was created on their behalf by Renaldo Reel, [place] an ophthalmic technician. The creation of this record is the provider's dictation and/or activities during the visit.    Electronically signed by:  Renaldo Reel, COT  09/25/21 12:12 PM  This document serves as a record of services personally performed by Gardiner Sleeper, MD, PhD. It was created on their behalf by San Jetty. Owens Shark, OA an ophthalmic technician. The creation of this record is the provider's dictation and/or activities during the visit.    Electronically signed by: San Jetty. Owens Shark, OA  09.05.2023 12:12 PM  Gardiner Sleeper, M.D., Ph.D. Diseases & Surgery of the Retina and [place]  I have reviewed the above documentation for accuracy and completeness, and I agree with the above. Gardiner Sleeper, M.D., Ph.D. 10/07/21 12:12 PM   Abbreviations: M myopia (nearsighted); A astigmatism; H hyperopia (farsighted); P presbyopia; Mrx spectacle prescription;  CTL contact lenses; OD right eye; OS left eye; OU both eyes  XT exotropia; ET esotropia; PEK punctate epithelial keratitis; PEE punctate epithelial erosions; DES dry eye syndrome; MGD meibomian gland dysfunction; ATs artificial tears; PFAT's  preservative free artificial tears; [place] nuclear sclerotic cataract; PSC posterior subcapsular cataract; ERM epi-retinal membrane; PVD posterior vitreous detachment; RD retinal detachment; DM diabetes mellitus; DR diabetic retinopathy; NPDR non-proliferative diabetic retinopathy; PDR proliferative diabetic retinopathy; CSME clinically significant macular edema; DME diabetic macular edema; dbh dot blot hemorrhages; CWS cotton wool spot; POAG primary open angle glaucoma; C/D cup-to-disc ratio; HVF humphrey visual field; GVF goldmann visual field; OCT optical coherence tomography; IOP intraocular pressure; BRVO Branch retinal vein occlusion; CRVO central retinal vein occlusion; CRAO central retinal artery occlusion; BRAO branch retinal artery occlusion; RT retinal tear; SB scleral buckle; PPV pars plana vitrectomy; VH Vitreous hemorrhage; PRP panretinal laser photocoagulation; IVK intravitreal kenalog; VMT vitreomacular traction; MH Macular hole;  NVD neovascularization of the disc; NVE neovascularization elsewhere; AREDS age related eye disease study; ARMD age related macular degeneration; POAG primary open angle glaucoma; EBMD epithelial/anterior basement membrane dystrophy; ACIOL anterior chamber intraocular lens; IOL intraocular lens; PCIOL posterior chamber intraocular lens; Phaco/IOL phacoemulsification with intraocular lens placement; [place] photorefractive keratectomy; LASIK laser assisted in situ keratomileusis; HTN hypertension; DM diabetes mellitus; COPD chronic obstructive pulmonary disease

## 2021-09-29 ENCOUNTER — Ambulatory Visit (INDEPENDENT_AMBULATORY_CARE_PROVIDER_SITE_OTHER): Payer: Medicare Other

## 2021-09-29 DIAGNOSIS — I639 Cerebral infarction, unspecified: Secondary | ICD-10-CM | POA: Diagnosis not present

## 2021-09-29 LAB — CUP PACEART REMOTE DEVICE CHECK
Date Time Interrogation Session: 20230822101246
Implantable Pulse Generator Implant Date: 20230407

## 2021-09-29 NOTE — Progress Notes (Signed)
[facility] Summary Report / Loop Recorder 

## 2021-10-07 ENCOUNTER — Encounter (INDEPENDENT_AMBULATORY_CARE_PROVIDER_SITE_OTHER): Payer: Self-pay | Admitting: Ophthalmology

## 2021-10-07 ENCOUNTER — Ambulatory Visit (INDEPENDENT_AMBULATORY_CARE_PROVIDER_SITE_OTHER): Payer: Medicare Other | Admitting: Ophthalmology

## 2021-10-07 DIAGNOSIS — I639 Cerebral infarction, unspecified: Secondary | ICD-10-CM

## 2021-10-07 DIAGNOSIS — Z961 Presence of intraocular lens: Secondary | ICD-10-CM

## 2021-10-07 DIAGNOSIS — H35033 Hypertensive retinopathy, bilateral: Secondary | ICD-10-CM | POA: Diagnosis not present

## 2021-10-07 DIAGNOSIS — E113313 Type 2 diabetes mellitus with moderate nonproliferative diabetic retinopathy with macular edema, bilateral: Secondary | ICD-10-CM | POA: Diagnosis not present

## 2021-10-07 DIAGNOSIS — I10 Essential (primary) hypertension: Secondary | ICD-10-CM | POA: Diagnosis not present

## 2021-10-07 DIAGNOSIS — H547 Unspecified visual loss: Secondary | ICD-10-CM

## 2021-10-07 DIAGNOSIS — H25813 Combined forms of age-related cataract, bilateral: Secondary | ICD-10-CM

## 2021-10-14 ENCOUNTER — Encounter: Payer: Self-pay | Admitting: Family Medicine

## 2021-10-14 ENCOUNTER — Ambulatory Visit (INDEPENDENT_AMBULATORY_CARE_PROVIDER_SITE_OTHER): Payer: Medicare Other | Admitting: Family Medicine

## 2021-10-14 VITALS — BP 134/82 | HR 70 | Temp 97.6°F | Wt 244.4 lb

## 2021-10-14 DIAGNOSIS — E1122 Type 2 diabetes mellitus with diabetic chronic kidney disease: Secondary | ICD-10-CM

## 2021-10-14 DIAGNOSIS — I69311 Memory deficit following cerebral infarction: Secondary | ICD-10-CM | POA: Diagnosis not present

## 2021-10-14 DIAGNOSIS — N1831 Chronic kidney disease, stage 3a: Secondary | ICD-10-CM

## 2021-10-14 MED ORDER — RYBELSUS 3 MG PO TABS: 3.0000 mg | ORAL_TABLET | Freq: Every day | ORAL | 0 refills | Status: DC

## 2021-10-14 MED ORDER — RYBELSUS 3 MG PO TABS: 3.0000 mg | ORAL_TABLET | Freq: Every day | ORAL | 2 refills | Status: AC

## 2021-10-14 NOTE — Progress Notes (Addendum)
Assessment/Plan:   Problem List Items Addressed This Visit       Endocrine   Type 2 diabetes mellitus with stage 3a chronic kidney disease, without long-term current use of insulin ([place]) - Primary    Uncontrolled Currently on Jardiance 25 mg Add Rybelsus 3 mg Follow-up 3 months      Relevant Medications   Semaglutide (RYBELSUS) 3 MG TABS   Other Visit Diagnoses     Memory deficit due to and not concurrent with embolic stroke              Subjective:  HPI:  Jami Ohlin is a 66 y.o. male who has Essential hypertension; Acute pain of left knee; Dyspnea on exertion; Abnormal nuclear stress test; Type 2 diabetes mellitus with stage 3a chronic kidney disease, without long-term current use of insulin ([place]); Arthritis of left knee; Cecal polyp; History of colon polyps; Abnormal colonoscopy; Anemia; Acute CVA (cerebrovascular accident) ([place]); Hyperlipidemia; Cerebrovascular accident (CVA) ([place]); [place]; History of ischemic stroke; Cryptogenic stroke ([place]); Familial hypercholesterolemia; Leg edema; and Coronary artery disease involving native coronary artery of native heart without angina pectoris on their problem list..   He  has a past medical history of Arthritis, Cataract, Diabetes mellitus without complication ([place]), Diabetic retinopathy ([place]), Hyperlipidemia, Hypertension, Hypertensive retinopathy, Obesity, and Stroke ([place]).Marland Kitchen   He presents with chief complaint of Establish Care .   DIABETES Type II, established problem, uncontrolled Medications: jardiance 25 mg, Reports taking and tolerating without side effects. Interim History- Recent Hga1c in 09/2021 was 8.6. Was instructed to restart metformin, but patient states he does not remember this. He recently had a stroke and has memory issues.   ROS: Denies Polyuria,Polydipsia, Denies  Hypoglycemia symptoms (palpitations, tremors, anxiousness)    Past Surgical History:  Procedure Laterality Date   BIOPSY  11/29/2019    Procedure: BIOPSY;  Surgeon: Rush Landmark Telford Nab., MD;  Location: [facility];  Service: Gastroenterology;;   COLONOSCOPY WITH PROPOFOL N/A 11/29/2019   Procedure: COLONOSCOPY WITH PROPOFOL;  Surgeon: Irving Copas., MD;  Location: [facility];  Service: Gastroenterology;  Laterality: N/A;   ENDOSCOPIC MUCOSAL RESECTION N/A 11/29/2019   Procedure: ENDOSCOPIC MUCOSAL RESECTION;  Surgeon: Rush Landmark Telford Nab., MD;  [facility];  Service: Gastroenterology;  Laterality: N/A;   ESOPHAGOGASTRODUODENOSCOPY (EGD) WITH PROPOFOL N/A 11/29/2019   Procedure: ESOPHAGOGASTRODUODENOSCOPY (EGD) WITH PROPOFOL;  Surgeon: Rush Landmark Telford Nab., MD;  [facility];  Service: Gastroenterology;  Laterality: N/A;   EYE SURGERY Left    had to have eye flushed after getting costic sodium in L eye   HEMOSTASIS CLIP PLACEMENT  11/29/2019   Procedure: HEMOSTASIS CLIP PLACEMENT;  Surgeon: Irving Copas., MD;  [facility];  Service: Gastroenterology;;   HERNIA REPAIR     HERNIA REPAIR     35 years ago   HOT HEMOSTASIS N/A 11/29/2019   Procedure: HOT HEMOSTASIS (ARGON PLASMA COAGULATION/BICAP);  Surgeon: Irving Copas., MD;  Location: [facility];  Service: Gastroenterology;  Laterality: N/A;   JOINT REPLACEMENT  2021   Left knee replacement   LEFT HEART CATH AND CORONARY ANGIOGRAPHY N/A 04/11/2019   Procedure: LEFT HEART CATH AND CORONARY ANGIOGRAPHY;  Surgeon: Adrian Prows, MD;  Location: [facility];  Service: Cardiovascular;  Laterality: N/A;   POLYPECTOMY  11/29/2019   Procedure: POLYPECTOMY;  Surgeon: Rush Landmark Telford Nab., MD;  Location: [facility];  Service: Gastroenterology;;   RENAL ANGIOGRAPHY Bilateral 04/11/2019   Procedure: RENAL ANGIOGRAPHY;  Surgeon: Adrian Prows, MD;  Location: [facility];  Service: Cardiovascular;  Laterality: Bilateral;   SUBMUCOSAL LIFTING INJECTION  11/29/2019   Procedure: SUBMUCOSAL LIFTING INJECTION;   Surgeon: Irving Copas., MD;  Location: [facility];  Service: Gastroenterology;;   TOTAL KNEE ARTHROPLASTY Left 08/01/2019   Procedure: LEFT TOTAL KNEE ARTHROPLASTY;  Surgeon: Meredith Pel, MD;  Location: [facility];  Service: Orthopedics;  Laterality: Left;    Outpatient Medications Prior to Visit  Medication Sig Dispense Refill   amLODipine (NORVASC) 10 MG tablet TAKE 1 TABLET BY MOUTH EVERY DAY 90 tablet 0   aspirin EC 81 MG tablet Take 1 tablet (81 mg total) by mouth daily. Swallow whole. 30 tablet 11   atorvastatin (LIPITOR) 80 MG tablet TAKE 1 TABLET BY MOUTH EVERY DAY 90 tablet 1   blood glucose meter kit and supplies KIT Dispense based on patient and insurance preference. Use up to four times daily as directed. 1 each 0   clonazePAM (KLONOPIN) 0.25 MG disintegrating tablet Take 1 tablet (0.25 mg total) by mouth 2 (two) times daily. (Patient taking differently: Take 0.25 mg by mouth as needed.) 60 tablet 0   Continuous Blood Gluc Sensor (DEXCOM G6 SENSOR) MISC Apply one every 10 days per package instructions 9 each 1   Continuous Blood Gluc Transmit (DEXCOM G6 TRANSMITTER) MISC 1 each by Does not apply route every 3 (three) months. 1 each 1   hydrochlorothiazide (HYDRODIURIL) 25 MG tablet Take 1 tablet (25 mg total) by mouth daily. 90 tablet 3   JARDIANCE 25 MG TABS tablet TAKE 1 TABLET BY MOUTH EVERY DAY BEFORE BREAKFAST 90 tablet 0   losartan (COZAAR) 50 MG tablet TAKE 1 TABLET (50 MG TOTAL) BY MOUTH DAILY AT 10 PM. 90 tablet 0   metoprolol succinate (TOPROL-XL) 50 MG 24 hr tablet Take 1 tablet (50 mg total) by mouth daily. Take with or immediately following a meal. 90 tablet 3   Prednisol Ace-Moxiflox-Bromfen 1-0.5-0.075 % SUSP Place 1 drop into the left eye 3 (three) times daily.     No facility-administered medications prior to visit.    Family History  Problem Relation Age of Onset   Diabetes Mother    COPD Mother    Cancer Mother        unknown type    Heart  disease Father    COPD Sister    Healthy Brother    Heart disease Brother    Healthy Son    Colon cancer Neg Hx    Colon polyps Neg Hx    Esophageal cancer Neg Hx    Rectal cancer Neg Hx    Stomach cancer Neg Hx    Inflammatory bowel disease Neg Hx    Liver disease Neg Hx    Pancreatic cancer Neg Hx     Social History   Socioeconomic History   Marital status: Single    Spouse name: Not on file   Number of children: 1   Years of education: Not on file   Highest education level: Not on file  Occupational History   Not on file  Tobacco Use   Smoking status: Never   Smokeless tobacco: Never  Vaping Use   Vaping Use: Never used  Substance and Sexual Activity   Alcohol use: Not Currently   Drug use: Never   Sexual activity: Not Currently  Other Topics Concern   Not on file  Social History Narrative   Not on file   Social Determinants of Health   Financial Resource  Strain: Low Risk  (05/28/2021)   Overall Financial Resource Strain (CARDIA)    Difficulty of Paying Living Expenses: Not hard at all  Food Insecurity: No Food Insecurity (05/28/2021)   Hunger Vital Sign    Worried About Running Out of Food in the Last Year: Never true    Ran Out of Food in the Last Year: Never true  Transportation Needs: No Transportation Needs (05/28/2021)   PRAPARE - Hydrologist (Medical): No    Lack of Transportation (Non-Medical): No  Physical Activity: Inactive (05/28/2021)   Exercise Vital Sign    Days of Exercise per Week: 0 days    Minutes of Exercise per Session: 0 min  Stress: No Stress Concern Present (05/28/2021)   [place]    Feeling of Stress : Only a little  Social Connections: Moderately Isolated (05/28/2021)   Social Connection and Isolation Panel [NHANES]    Frequency of Communication with Friends and Family: Three times a week    Frequency of Social Gatherings with Friends and  Family: Three times a week    Attends Religious Services: More than 4 times per year    Active Member of Clubs or Organizations: No    Attends Archivist Meetings: Never    Marital Status: Divorced  Human resources officer Violence: Not At Risk (05/28/2021)   Humiliation, Afraid, Rape, and Kick questionnaire    Fear of Current or Ex-Partner: No    Emotionally Abused: No    Physically Abused: No    Sexually Abused: No                                                                                                 Objective:  Physical Exam: BP 134/82 (BP Location: Left Arm, Patient Position: Sitting, Cuff Size: Large)   Pulse 70   Temp 97.6 F (36.4 C) (Temporal)   Wt 244 lb 6.4 oz (110.9 kg)   SpO2 97%   BMI 37.16 kg/m    General: No acute distress. Awake and conversant.  Eyes: Normal conjunctiva, anicteric. Round symmetric pupils.  ENT: Hearing grossly intact. No nasal discharge.  Neck: Neck is supple. No masses or thyromegaly.  Respiratory: Respirations are non-labored. No auditory wheezing.  Skin: Warm. No rashes or ulcers.  Psych: Alert and oriented. Cooperative, Appropriate mood and affect, Normal judgment.  CV: No cyanosis or JVD MSK: Normal ambulation. No clubbing  Neuro: Sensation and CN II-XII grossly normal.        Alesia Banda, MD, MS

## 2021-10-14 NOTE — Assessment & Plan Note (Addendum)
Uncontrolled Currently on Jardiance 25 mg Add Rybelsus 3 mg Follow-up 3 months

## 2021-10-14 NOTE — Patient Instructions (Addendum)
Try Rybelsus for diabetes, take in the morning 30 minutes before eating

## 2021-10-20 ENCOUNTER — Telehealth: Payer: Self-pay | Admitting: Family Medicine

## 2021-10-20 MED ORDER — GLIPIZIDE 5 MG PO TABS: 5.0000 mg | ORAL_TABLET | Freq: Two times a day (BID) | ORAL | 3 refills | Status: DC

## 2021-10-20 NOTE — Telephone Encounter (Signed)
Pt is wondering if there is an alternative to his Semaglutide (RYBELSUS) 3 MG TABS [[id number]]. This script is going to cost $258 for him which is to expensive.   [name] [address] [id number] Phone:  [phone] Fax:  [phone] DEA #:  [id number]

## 2021-10-20 NOTE — Addendum Note (Signed)
Addended by: Josephine Igo B on: 10/20/2021 04:43 PM   Modules accepted: Orders

## 2021-10-21 ENCOUNTER — Ambulatory Visit: Payer: Medicare Other | Admitting: Podiatry

## 2021-10-23 NOTE — Progress Notes (Signed)
[facility] Summary Report / Loop Recorder 

## 2021-10-27 ENCOUNTER — Ambulatory Visit: Payer: Medicare Other | Admitting: Neurology

## 2021-10-27 ENCOUNTER — Ambulatory Visit (INDEPENDENT_AMBULATORY_CARE_PROVIDER_SITE_OTHER): Payer: Medicare Other

## 2021-10-27 DIAGNOSIS — I639 Cerebral infarction, unspecified: Secondary | ICD-10-CM | POA: Diagnosis not present

## 2021-10-28 LAB — CUP PACEART REMOTE DEVICE CHECK
Date Time Interrogation Session: 20230924101359
Implantable Pulse Generator Implant Date: 20230407

## 2021-11-11 ENCOUNTER — Other Ambulatory Visit: Payer: Self-pay | Admitting: Cardiology

## 2021-11-11 DIAGNOSIS — I10 Essential (primary) hypertension: Secondary | ICD-10-CM

## 2021-11-11 DIAGNOSIS — I639 Cerebral infarction, unspecified: Secondary | ICD-10-CM

## 2021-11-12 NOTE — Progress Notes (Signed)
[facility] Summary Report / Loop Recorder 

## 2021-11-24 ENCOUNTER — Telehealth: Payer: Self-pay | Admitting: Family Medicine

## 2021-11-24 DIAGNOSIS — I10 Essential (primary) hypertension: Secondary | ICD-10-CM

## 2021-11-24 DIAGNOSIS — E119 Type 2 diabetes mellitus without complications: Secondary | ICD-10-CM

## 2021-11-24 MED ORDER — AMLODIPINE BESYLATE 10 MG PO TABS: 10.0000 mg | ORAL_TABLET | Freq: Every day | ORAL | 0 refills | Status: DC

## 2021-11-24 MED ORDER — EMPAGLIFLOZIN 25 MG PO TABS: 25.0000 mg | ORAL_TABLET | Freq: Every day | ORAL | 0 refills | Status: DC

## 2021-11-24 NOTE — Telephone Encounter (Signed)
Patient is aware 

## 2021-11-24 NOTE — Telephone Encounter (Signed)
Chart supports rx. Last OV: 10/20/2021 Next OV: 01/13/2022  Tried to refill Jardiance, but no sig/instructions was listed from previous provider.

## 2021-11-24 NOTE — Telephone Encounter (Signed)
Caller Name: Jatavian   Call back phone #: [phone]   MEDICATION(S):  amLODipine (NORVASC) 10 MG tablet - 1 left 2. JARDIANCE 25 MG TABS tablet - 12 days   Has the patient contacted their pharmacy (YES/NO)? Yes, no MD response  Preferred Pharmacy:  [id number] [id number]

## 2021-12-01 ENCOUNTER — Encounter: Payer: Self-pay | Admitting: Neurology

## 2021-12-01 ENCOUNTER — Ambulatory Visit: Payer: Medicare Other | Admitting: Neurology

## 2021-12-01 ENCOUNTER — Ambulatory Visit (INDEPENDENT_AMBULATORY_CARE_PROVIDER_SITE_OTHER): Payer: Medicare Other

## 2021-12-01 VITALS — BP 170/82 | HR 76 | Ht 68.0 in | Wt 247.0 lb

## 2021-12-01 DIAGNOSIS — I639 Cerebral infarction, unspecified: Secondary | ICD-10-CM | POA: Diagnosis not present

## 2021-12-01 LAB — CUP PACEART REMOTE DEVICE CHECK
Date Time Interrogation Session: 20231027101240
Implantable Pulse Generator Implant Date: 20230407

## 2021-12-01 NOTE — Progress Notes (Signed)
Chief Complaint  Patient presents with   Follow-up    Rm 14, alone States he is stable and doing well       ASSESSMENT AND PLAN  Ivo Moga is a 66 y.o. 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, and left thalamus, in January 2023  His stroke was in the left PCA territory,  MRA of the brain showed poor visualization of left PCA, diminished left vertebral artery  Status post loop recorder, under the care of cardiologist, no event from so far,  Vascular risk factor of hypertension, diabetes poorly controlled,  hyperlipidemia, obesity, sedentary lifestyle, high risk for obstructive sleep apnea, but he does not want to go through sleep study  Emphasized importance of optimize blood pressure control goal less than 130/80, diabetic control, A1c less than 7.0, LDL less than 70  Keep aspirin 81 mg daily, increase water intake, moderate exercise  Diabetic peripheral neuropathy  Distal leg skin discoloration, length-dependent decreased light touch pinprick to mid shin level  Emphasized importance of optimize DM control     Continue to follow-up with his primary care physician, only return to clinic for new issues  DIAGNOSTIC DATA (LABS, IMAGING, TESTING) - I reviewed patient records, labs, notes, testing and imaging myself where available.   MEDICAL HISTORY:  Lynkin Saini is a 66 year old male, seen in request by his primary care nurse practitioner [name], to follow-up for stroke, he is accompanied by his brother at today's visit April 24, 2021  I reviewed and summarized the referring note.  Past medical history Diabetes, in 2022 Hypertension  He is a retired Office manager, hospital admission on February 19, 2021 when father found him at house confused, word finding difficulties I personally reviewed MRI of the brain acute left PCA stroke involving posterior mesial temporal lobe with additional scattered infarction in the posterior left thalamus, left occipital  lobe.  MRA of brain and neck showed diminutive left vertebral artery, poor visualization of left PCA  Laboratory evaluation showed A1c of 12.3, he was restarted on glimepiride upon discharge,  LDL 124, he was started on Lipitor 80 mg daily  He has a history of hypertension, noncompliant with his medications, previous Cardiac cath showed coronary artery disease,  He is doing better now, has good social support by his siblings,  Ever since the event, he was noted to have short-term memory loss, slow reaction time, careful gait, in addition, there was episode reported sudden onset right leg gave out underneath him in March 2023, mild fatigue confused following that, but no seizure activity.  He has a long history of loud snoring, frequent awakening at nighttime, excessive daytime sleepiness fatigue,  UPDATE Dec 01 2021: He is overall doing well, apparently had noticeable focal deficit, mildly unsteady gait, contributing to his body habitus, joints pain  He has increased water intake, frequent urination, at least with 3-4 times at nighttime to urinate. He had loop recorder, no event from so far Echocardiogram showed normal ejection fraction, no wall motion abnormality Lab in August 2023: Normal TSH, A1C. 8.6,  LDL 55,   PHYSICAL EXAM:   Vitals:   12/01/21 0950  BP: (!) 170/82  Pulse: 76  Weight: 247 lb (112 kg)  Height: _0  (1.727 m)   Not recorded     Body mass index is 37.56 kg/m.  PHYSICAL EXAMNIATION:  Gen: NAD, conversant, well nourised, well groomed  Cardiovascular: Regular rate rhythm, no peripheral edema, warm, nontender. Eyes: Conjunctivae clear without exudates or hemorrhage Neck: Supple, no carotid bruits. Pulmonary: Clear to auscultation bilaterally   NEUROLOGICAL EXAM:  MENTAL STATUS: Obese tired looking middle-age male Speech/cognition: Awake alert, oriented to history taking and casual conversation    CRANIAL NERVES: CN II: Visual  fields are full to confrontation. Pupils are round equal and briskly reactive to light. CN III, IV, VI: extraocular movement are normal. No ptosis. CN V: Facial sensation is intact to light touch CN VII: Face is symmetric with normal eye closure  CN VIII: Hearing is normal to causal conversation. CN IX, X: Phonation is normal. CN XI: Head turning and shoulder shrug are intact CN XII: Narrow pharyngeal space  MOTOR: Mild fixation of left upper extremity on rapid rotating movement  REFLEXES: Reflexes are 1 and symmetric at the biceps, triceps, knees, and absent at ankles. Plantar responses are flexor.  SENSORY: Length-dependent decreased light touch, pinprick to mid shin level,  COORDINATION: There is no trunk or limb dysmetria noted.  GAIT/STANCE: Need push-up to get up from seated position, wide-based, cautious  REVIEW OF SYSTEMS:  Full 14 system review of systems performed and notable only for as above All other review of systems were negative.   ALLERGIES: No Known Allergies  HOME MEDICATIONS: Current Outpatient Medications  Medication Sig Dispense Refill   amLODipine (NORVASC) 10 MG tablet Take 1 tablet (10 mg total) by mouth daily. 90 tablet 0   aspirin EC 81 MG tablet Take 1 tablet (81 mg total) by mouth daily. Swallow whole. 30 tablet 11   atorvastatin (LIPITOR) 80 MG tablet TAKE 1 TABLET BY MOUTH EVERY DAY 90 tablet 1   blood glucose meter kit and supplies KIT Dispense based on patient and insurance preference. Use up to four times daily as directed. 1 each 0   clonazePAM (KLONOPIN) 0.25 MG disintegrating tablet Take 1 tablet (0.25 mg total) by mouth 2 (two) times daily. (Patient taking differently: Take 0.25 mg by mouth as needed.) 60 tablet 0   Continuous Blood Gluc Sensor (DEXCOM G6 SENSOR) MISC Apply one every 10 days per package instructions 9 each 1   Continuous Blood Gluc Transmit (DEXCOM G6 TRANSMITTER) MISC 1 each by Does not apply route every 3 (three) months. 1  each 1   empagliflozin (JARDIANCE) 25 MG TABS tablet Take 1 tablet (25 mg total) by mouth daily. 90 tablet 0   glipiZIDE (GLUCOTROL) 5 MG tablet Take 1 tablet (5 mg total) by mouth 2 (two) times daily before a meal. 60 tablet 3   hydrochlorothiazide (HYDRODIURIL) 25 MG tablet Take 1 tablet (25 mg total) by mouth daily. 90 tablet 3   losartan (COZAAR) 50 MG tablet TAKE 1 TABLET (50 MG TOTAL) BY MOUTH DAILY AT 10 PM. 90 tablet 0   metoprolol succinate (TOPROL-XL) 50 MG 24 hr tablet Take 1 tablet (50 mg total) by mouth daily. Take with or immediately following a meal. 90 tablet 3   Prednisol Ace-Moxiflox-Bromfen 1-0.5-0.075 % SUSP Place 1 drop into the left eye 3 (three) times daily.     Semaglutide (RYBELSUS) 3 MG TABS Take 3 mg by mouth daily. 30 tablet 2   No current facility-administered medications for this visit.    PAST MEDICAL HISTORY: Past Medical History:  Diagnosis Date   Arthritis    Cataract    Mixed OU   Diabetes mellitus without complication ([place])    Diabetic retinopathy ([place])    NPDR OU  Hyperlipidemia    Hypertension    Hypertensive retinopathy    OU   Obesity    Stroke [facility])     PAST SURGICAL HISTORY: Past Surgical History:  Procedure Laterality Date   BIOPSY  11/29/2019   Procedure: BIOPSY;  Surgeon: Rush Landmark Telford Nab., MD;  Location[facility];  Service: Gastroenterology;;   COLONOSCOPY WITH PROPOFOL N/A 11/29/2019   Procedure: COLONOSCOPY WITH PROPOFOL;  Surgeon: Irving Copas., MD;  Location: [facility];  Service: Gastroenterology;  Laterality: N/A;   ENDOSCOPIC MUCOSAL RESECTION N/A 11/29/2019   Procedure: ENDOSCOPIC MUCOSAL RESECTION;  Surgeon: Rush Landmark Telford Nab., MD;  Location: [facility];  Service: Gastroenterology;  Laterality: N/A;   ESOPHAGOGASTRODUODENOSCOPY (EGD) WITH PROPOFOL N/A 11/29/2019   Procedure: ESOPHAGOGASTRODUODENOSCOPY (EGD) WITH PROPOFOL;  Surgeon: Rush Landmark Telford Nab., MD;  Location: [facility];  Service:  Gastroenterology;  Laterality: N/A;   EYE SURGERY Left    had to have eye flushed after getting costic sodium in L eye   HEMOSTASIS CLIP PLACEMENT  11/29/2019   Procedure: HEMOSTASIS CLIP PLACEMENT;  Surgeon: Irving Copas., MD;  Location: [facility];  Service: Gastroenterology;;   HERNIA REPAIR     HERNIA REPAIR     35 years ago   HOT HEMOSTASIS N/A 11/29/2019   Procedure: HOT HEMOSTASIS (ARGON PLASMA COAGULATION/BICAP);  Surgeon: Irving Copas., MD;  Location: [facility];  Service: Gastroenterology;  Laterality: N/A;   JOINT REPLACEMENT  2021   Left knee replacement   LEFT HEART CATH AND CORONARY ANGIOGRAPHY N/A 04/11/2019   Procedure: LEFT HEART CATH AND CORONARY ANGIOGRAPHY;  Surgeon: Adrian Prows, MD;  Location: [facility];  Service: Cardiovascular;  Laterality: N/A;   POLYPECTOMY  11/29/2019   Procedure: POLYPECTOMY;  Surgeon: Rush Landmark Telford Nab., MD;  Location: [facility];  Service: Gastroenterology;;   RENAL ANGIOGRAPHY Bilateral 04/11/2019   Procedure: RENAL ANGIOGRAPHY;  Surgeon: Adrian Prows, MD;  Location: [facility] [facility];  Service: Cardiovascular;  Laterality: Bilateral;   SUBMUCOSAL LIFTING INJECTION  11/29/2019   Procedure: SUBMUCOSAL LIFTING INJECTION;  Surgeon: Irving Copas., MD;  Location: [facility];  Service: Gastroenterology;;   TOTAL KNEE ARTHROPLASTY Left 08/01/2019   Procedure: LEFT TOTAL KNEE ARTHROPLASTY;  Surgeon: Meredith Pel, MD;  Location: [facility];  Service: Orthopedics;  Laterality: Left;    FAMILY HISTORY: Family History  Problem Relation Age of Onset   Diabetes Mother    COPD Mother    Cancer Mother        unknown type    Heart disease Father    COPD Sister    Healthy Brother    Heart disease Brother    Healthy Son    Colon cancer Neg Hx    Colon polyps Neg Hx    Esophageal cancer Neg Hx    Rectal cancer Neg Hx    Stomach cancer Neg Hx    Inflammatory bowel disease Neg Hx    Liver disease  Neg Hx    Pancreatic cancer Neg Hx     SOCIAL HISTORY: Social History   Socioeconomic History   Marital status: Single    Spouse name: Not on file   Number of children: 1   Years of education: Not on file   Highest education level: Not on file  Occupational History   Not on file  Tobacco Use   Smoking status: Never   Smokeless tobacco: Never  Vaping Use   Vaping Use: Never used  Substance and Sexual Activity   Alcohol  use: Not Currently   Drug use: Never   Sexual activity: Not Currently  Other Topics Concern   Not on file  Social History Narrative   Not on file   Social Determinants of Health   Financial Resource Strain: Low Risk  (05/28/2021)   Overall Financial Resource Strain (CARDIA)    Difficulty of Paying Living Expenses: Not hard at all  Food Insecurity: No Food Insecurity (05/28/2021)   Hunger Vital Sign    Worried About Running Out of Food in the Last Year: Never true    Ran Out of Food in the Last Year: Never true  Transportation Needs: No Transportation Needs (05/28/2021)   PRAPARE - Hydrologist (Medical): No    Lack of Transportation (Non-Medical): No  Physical Activity: Inactive (05/28/2021)   Exercise Vital Sign    Days of Exercise per Week: 0 days    Minutes of Exercise per Session: 0 min  Stress: No Stress Concern Present (05/28/2021)   [place]    Feeling of Stress : Only a little  Social Connections: Moderately Isolated (05/28/2021)   Social Connection and Isolation Panel [NHANES]    Frequency of Communication with Friends and Family: Three times a week    Frequency of Social Gatherings with Friends and Family: Three times a week    Attends Religious Services: More than 4 times per year    Active Member of Clubs or Organizations: No    Attends Archivist Meetings: Never    Marital Status: Divorced  Human resources officer Violence: Not At Risk  (05/28/2021)   Humiliation, Afraid, Rape, and Kick questionnaire    Fear of Current or Ex-Partner: No    Emotionally Abused: No    Physically Abused: No    Sexually Abused: No      Marcial Pacas, M.D. Ph.D.  [facility] [address] [place], [place] [address] Ph: [phone] Fax: [phone]  CC:  [name], NP [place],   [id number]  [place], MD

## 2021-12-16 ENCOUNTER — Ambulatory Visit: Payer: Medicare Other | Admitting: Family Medicine

## 2022-01-03 NOTE — Progress Notes (Signed)
[facility] Summary Report / Loop Recorder 

## 2022-01-05 ENCOUNTER — Ambulatory Visit (INDEPENDENT_AMBULATORY_CARE_PROVIDER_SITE_OTHER): Payer: Medicare Other

## 2022-01-05 DIAGNOSIS — I639 Cerebral infarction, unspecified: Secondary | ICD-10-CM | POA: Diagnosis not present

## 2022-01-05 LAB — CUP PACEART REMOTE DEVICE CHECK
Date Time Interrogation Session: 20231203230810
Implantable Pulse Generator Implant Date: 20230407

## 2022-01-13 ENCOUNTER — Ambulatory Visit (INDEPENDENT_AMBULATORY_CARE_PROVIDER_SITE_OTHER): Payer: Medicare Other | Admitting: Family Medicine

## 2022-01-13 ENCOUNTER — Encounter: Payer: Self-pay | Admitting: Family Medicine

## 2022-01-13 VITALS — BP 134/86 | HR 79 | Temp 97.0°F | Wt 255.6 lb

## 2022-01-13 DIAGNOSIS — E1159 Type 2 diabetes mellitus with other circulatory complications: Secondary | ICD-10-CM

## 2022-01-13 DIAGNOSIS — E1169 Type 2 diabetes mellitus with other specified complication: Secondary | ICD-10-CM | POA: Diagnosis not present

## 2022-01-13 DIAGNOSIS — E119 Type 2 diabetes mellitus without complications: Secondary | ICD-10-CM | POA: Diagnosis not present

## 2022-01-13 DIAGNOSIS — E1122 Type 2 diabetes mellitus with diabetic chronic kidney disease: Secondary | ICD-10-CM | POA: Diagnosis not present

## 2022-01-13 DIAGNOSIS — I152 Hypertension secondary to endocrine disorders: Secondary | ICD-10-CM

## 2022-01-13 DIAGNOSIS — N1831 Chronic kidney disease, stage 3a: Secondary | ICD-10-CM

## 2022-01-13 DIAGNOSIS — E785 Hyperlipidemia, unspecified: Secondary | ICD-10-CM

## 2022-01-13 LAB — POCT GLYCOSYLATED HEMOGLOBIN (HGB A1C): Hemoglobin A1C: 6.9 % — AB (ref 4.0–5.6)

## 2022-01-13 NOTE — Assessment & Plan Note (Signed)
Stable  continue atorvastatin

## 2022-01-13 NOTE — Assessment & Plan Note (Signed)
Stable Medications: Amlodipine 10 mg daily Losartan 50 mg daily Hydrochlorothiazide 25 mg daily Metoprolol 50 mg daily Continue current medications

## 2022-01-13 NOTE — Assessment & Plan Note (Signed)
Stable Medications  Empagliflozin 25 mg daily Glipizide 5 mg twice daily Continue current medication Follow-up 3 months

## 2022-01-13 NOTE — Progress Notes (Signed)
Assessment/Plan:   Problem List Items Addressed This Visit       Cardiovascular and Mediastinum   Hypertension associated with diabetes ([place])    Stable Medications: Amlodipine 10 mg daily Losartan 50 mg daily Hydrochlorothiazide 25 mg daily Metoprolol 50 mg daily Continue current medications        Endocrine   Type 2 diabetes mellitus with stage 3a chronic kidney disease, without long-term current use of insulin (HCC)    Stable Medications  Empagliflozin 25 mg daily Glipizide 5 mg twice daily Continue current medication Follow-up 3 months      Hyperlipidemia associated with type 2 diabetes mellitus ([place])    Stable  continue atorvastatin      Other Visit Diagnoses     Type 2 diabetes mellitus without complication, without long-term current use of insulin ([place])    -  Primary   Relevant Orders   POCT HgB A1C (Completed)          Subjective:  HPI:  Joe Stewart is a 66 y.o. male who has Hypertension associated with diabetes ([place]); Acute pain of left knee; Dyspnea on exertion; Abnormal nuclear stress test; Type 2 diabetes mellitus with stage 3a chronic kidney disease, without long-term current use of insulin ([place]); Arthritis of left knee; Cecal polyp; History of colon polyps; Abnormal colonoscopy; Anemia; Acute CVA (cerebrovascular accident) ([place]); Hyperlipidemia associated with type 2 diabetes mellitus ([place]); Cerebrovascular accident (CVA) ([place]); [place]; History of ischemic stroke; Cryptogenic stroke ([place]); Familial hypercholesterolemia; Leg edema; and Coronary artery disease involving native coronary artery of native heart without angina pectoris on their problem list..   He  has a past medical history of Arthritis, Cataract, Diabetes mellitus without complication ([place]), Diabetic retinopathy ([place]), Hyperlipidemia, Hypertension, Hypertensive retinopathy, Obesity, and Stroke ([place]).Marland Kitchen   He presents with chief complaint of Follow-up (DM & b/p  fasting) .   DIABETES  Type II, established problem, stable Medications: Empagliflozin and glipizide, Reports taking and tolerating without side effects. Blood Sugars per patient: Fasting: 97-120s Diet-tries to eat low low carb,   Interim History-none  ROS: Denies polyuria,Polydipsia, Denies hypoglycemia symptoms (palpitations, tremors, anxiousness)   HGBA1C Lab Results  Component Value Date   HGBA1C 6.9 (A) 01/13/2022   HGBA1C 8.6 (H) 09/05/2021   HGBA1C 7.4 (H) 06/02/2021    Lipid Panel Lab Results  Component Value Date   CHOL 140 09/05/2021   TRIG 162.0 (H) 09/05/2021   HDL 36.70 (L) 09/05/2021   LDLCALC 71 09/05/2021    Renal Function Lab Results  Component Value Date   CREATININE 1.38 (H) 09/16/2021   GFR 60.54 09/05/2021   MICROALBUR 26.8 (H) 03/05/2021   LABCREAU 187.7 03/05/2021   MICRALBCREAT 14.3 03/05/2021   Hypertension, established problem, Stable BP Readings from Last 3 Encounters:  01/13/22 134/86  12/01/21 (!) 170/82  10/14/21 134/82    Current Medications: Hydrochlorothiazide, amlodipine, losartan, metoprolol, compliant without side effects. Interim History: None  ROS: Denies any chest pain, shortness of breath, dyspnea on exertion, leg edema.    Hyperlipidemia, established problem, Stable Current medication(s): Atorvastatin.  Compliant without side effects.  ROS: No chest pain or shortness of breath. No myalgias.    Past Surgical History:  Procedure Laterality Date   BIOPSY  11/29/2019   Procedure: BIOPSY;  Surgeon: Rush Landmark Telford Nab., MD;  Location: [facility];  Service: Gastroenterology;;   COLONOSCOPY WITH PROPOFOL N/A 11/29/2019   Procedure: COLONOSCOPY WITH PROPOFOL;  Surgeon: Irving Copas., MD;  Location: [facility];  Service: Gastroenterology;  Laterality:  N/A;   ENDOSCOPIC MUCOSAL RESECTION N/A 11/29/2019   Procedure: ENDOSCOPIC MUCOSAL RESECTION;  Surgeon: Rush Landmark Telford Nab., MD;  Location: [facility];  Service:  Gastroenterology;  Laterality: N/A;   ESOPHAGOGASTRODUODENOSCOPY (EGD) WITH PROPOFOL N/A 11/29/2019   Procedure: ESOPHAGOGASTRODUODENOSCOPY (EGD) WITH PROPOFOL;  Surgeon: Rush Landmark Telford Nab., MD;  Location: [facility];  Service: Gastroenterology;  Laterality: N/A;   EYE SURGERY Left    had to have eye flushed after getting costic sodium in L eye   HEMOSTASIS CLIP PLACEMENT  11/29/2019   Procedure: HEMOSTASIS CLIP PLACEMENT;  Surgeon: Irving Copas., MD;  Location: [facility];  Service: Gastroenterology;;   HERNIA REPAIR     HERNIA REPAIR     35 years ago   HOT HEMOSTASIS N/A 11/29/2019   Procedure: HOT HEMOSTASIS (ARGON PLASMA COAGULATION/BICAP);  Surgeon: Irving Copas., MD;  Location: [facility];  Service: Gastroenterology;  Laterality: N/A;   JOINT REPLACEMENT  2021   Left knee replacement   LEFT HEART CATH AND CORONARY ANGIOGRAPHY N/A 04/11/2019   Procedure: LEFT HEART CATH AND CORONARY ANGIOGRAPHY;  Surgeon: Adrian Prows, MD;  Location: [facility];  Service: Cardiovascular;  Laterality: N/A;   POLYPECTOMY  11/29/2019   Procedure: POLYPECTOMY;  Surgeon: Rush Landmark Telford Nab., MD;  Location: [facility];  Service: Gastroenterology;;   RENAL ANGIOGRAPHY Bilateral 04/11/2019   Procedure: RENAL ANGIOGRAPHY;  Surgeon: Adrian Prows, MD;  Location: [facility];  Service: Cardiovascular;  Laterality: Bilateral;   SUBMUCOSAL LIFTING INJECTION  11/29/2019   Procedure: SUBMUCOSAL LIFTING INJECTION;  Surgeon: Irving Copas., MD;  Location: [facility];  Service: Gastroenterology;;   TOTAL KNEE ARTHROPLASTY Left 08/01/2019   Procedure: LEFT TOTAL KNEE ARTHROPLASTY;  Surgeon: Meredith Pel, MD;  Location: [facility];  Service: Orthopedics;  Laterality: Left;    Outpatient Medications Prior to Visit  Medication Sig Dispense Refill   amLODipine (NORVASC) 10 MG tablet Take 1 tablet (10 mg total) by mouth daily. 90 tablet 0   aspirin EC 81 MG tablet  Take 1 tablet (81 mg total) by mouth daily. Swallow whole. 30 tablet 11   atorvastatin (LIPITOR) 80 MG tablet TAKE 1 TABLET BY MOUTH EVERY DAY 90 tablet 1   blood glucose meter kit and supplies KIT Dispense based on patient and insurance preference. Use up to four times daily as directed. 1 each 0   clonazePAM (KLONOPIN) 0.25 MG disintegrating tablet Take 1 tablet (0.25 mg total) by mouth 2 (two) times daily. (Patient taking differently: Take 0.25 mg by mouth as needed.) 60 tablet 0   empagliflozin (JARDIANCE) 25 MG TABS tablet Take 1 tablet (25 mg total) by mouth daily. 90 tablet 0   glipiZIDE (GLUCOTROL) 5 MG tablet Take 1 tablet (5 mg total) by mouth 2 (two) times daily before a meal. 60 tablet 3   hydrochlorothiazide (HYDRODIURIL) 25 MG tablet Take 1 tablet (25 mg total) by mouth daily. 90 tablet 3   losartan (COZAAR) 50 MG tablet TAKE 1 TABLET (50 MG TOTAL) BY MOUTH DAILY AT 10 PM. 90 tablet 0   metoprolol succinate (TOPROL-XL) 50 MG 24 hr tablet Take 1 tablet (50 mg total) by mouth daily. Take with or immediately following a meal. 90 tablet 3   Continuous Blood Gluc Sensor (DEXCOM G6 SENSOR) MISC Apply one every 10 days per package instructions 9 each 1   Continuous Blood Gluc Transmit (DEXCOM G6 TRANSMITTER) MISC 1 each by Does not apply route every 3 (three) months. 1 each 1  Prednisol Ace-Moxiflox-Bromfen 1-0.5-0.075 % SUSP Place 1 drop into the left eye 3 (three) times daily. (Patient not taking: Reported on 01/13/2022)     No facility-administered medications prior to visit.    Family History  Problem Relation Age of Onset   Diabetes Mother    COPD Mother    Cancer Mother        unknown type    Heart disease Father    COPD Sister    Healthy Brother    Heart disease Brother    Healthy Son    Colon cancer Neg Hx    Colon polyps Neg Hx    Esophageal cancer Neg Hx    Rectal cancer Neg Hx    Stomach cancer Neg Hx    Inflammatory bowel disease Neg Hx    Liver disease Neg Hx     Pancreatic cancer Neg Hx     Social History   Socioeconomic History   Marital status: Single    Spouse name: Not on file   Number of children: 1   Years of education: Not on file   Highest education level: Not on file  Occupational History   Not on file  Tobacco Use   Smoking status: Never   Smokeless tobacco: Never  Vaping Use   Vaping Use: Never used  Substance and Sexual Activity   Alcohol use: Not Currently   Drug use: Never   Sexual activity: Not Currently  Other Topics Concern   Not on file  Social History Narrative   Not on file   Social Determinants of Health   Financial Resource Strain: Low Risk  (05/28/2021)   Overall Financial Resource Strain (CARDIA)    Difficulty of Paying Living Expenses: Not hard at all  Food Insecurity: No Food Insecurity (05/28/2021)   Hunger Vital Sign    Worried About Running Out of Food in the Last Year: Never true    Ran Out of Food in the Last Year: Never true  Transportation Needs: No Transportation Needs (05/28/2021)   PRAPARE - Hydrologist (Medical): No    Lack of Transportation (Non-Medical): No  Physical Activity: Inactive (05/28/2021)   Exercise Vital Sign    Days of Exercise per Week: 0 days    Minutes of Exercise per Session: 0 min  Stress: No Stress Concern Present (05/28/2021)   [place]    Feeling of Stress : Only a little  Social Connections: Moderately Isolated (05/28/2021)   Social Connection and Isolation Panel [NHANES]    Frequency of Communication with Friends and Family: Three times a week    Frequency of Social Gatherings with Friends and Family: Three times a week    Attends Religious Services: More than 4 times per year    Active Member of Clubs or Organizations: No    Attends Archivist Meetings: Never    Marital Status: Divorced  Human resources officer Violence: Not At Risk (05/28/2021)   Humiliation,  Afraid, Rape, and Kick questionnaire    Fear of Current or Ex-Partner: No    Emotionally Abused: No    Physically Abused: No    Sexually Abused: No  Objective:  Physical Exam: BP 134/86 (BP Location: Left Arm, Patient Position: Sitting, Cuff Size: Large)   Pulse 79   Temp (!) 97 F (36.1 C) (Temporal)   Wt 255 lb 9.6 oz (115.9 kg)   SpO2 97%   BMI 38.86 kg/m    General: No acute distress. Awake and conversant.  Eyes: Normal conjunctiva, anicteric. Round symmetric pupils.  ENT: Hearing grossly intact. No nasal discharge.  Neck: Neck is supple. No masses or thyromegaly.  Respiratory: Respirations are non-labored. No auditory wheezing.  Skin: Warm. No rashes or ulcers.  Psych: Alert and oriented. Cooperative, Appropriate mood and affect, Normal judgment.  CV: No cyanosis or JVD MSK: Normal ambulation. No clubbing  Neuro: Sensation and CN II-XII grossly normal.        Alesia Banda, MD, MS

## 2022-01-16 ENCOUNTER — Other Ambulatory Visit: Payer: Self-pay | Admitting: Family Medicine

## 2022-01-16 DIAGNOSIS — N1831 Chronic kidney disease, stage 3a: Secondary | ICD-10-CM

## 2022-01-16 NOTE — Telephone Encounter (Signed)
Chart supports rx. Last OV: [id number] Next OV: [id number]

## 2022-02-05 NOTE — Progress Notes (Addendum)
[facility] Note  02/06/2022    CHIEF COMPLAINT Patient presents for Retina Follow Up   HISTORY OF PRESENT ILLNESS: Joe Stewart is a 67 y.o. male who presents to the clinic today for:  HPI     Retina Follow Up   Patient presents with  Diabetic Retinopathy.  In both eyes.  Severity is moderate.  Duration of 4 months.  Since onset it is stable.  I, the attending physician,  performed the HPI with the patient and updated documentation appropriately.        Comments   Pt here for 4 mo ret f/u NPDR OU/CVA. Pt states VA is stable, no changes noted.       Last edited by Bernarda Caffey, MD on 02/06/2022 12:31 PM.    Pt feels like left eye vision is down, his last A1c was 6.9 on 12.12.23  Referring physician: Shirleen Schirmer, [place], [place] [address]  HISTORICAL INFORMATION:   Selected notes from the MEDICAL RECORD NUMBER Referred by [name], PA LEE: 09.15.21 BCVA OD: 20/20 OS: 20/20 Ocular Hx- NPDR with edema  PMH- DM, HTN   CURRENT MEDICATIONS: No current outpatient medications on file. (Ophthalmic Drugs)   No current facility-administered medications for this visit. (Ophthalmic Drugs)   Current Outpatient Medications (Other)  Medication Sig   amLODipine (NORVASC) 10 MG tablet Take 1 tablet (10 mg total) by mouth daily.   aspirin EC 81 MG tablet Take 1 tablet (81 mg total) by mouth daily. Swallow whole.   atorvastatin (LIPITOR) 80 MG tablet TAKE 1 TABLET BY MOUTH EVERY DAY   blood glucose meter kit and supplies KIT Dispense based on patient and insurance preference. Use up to four times daily as directed.   clonazePAM (KLONOPIN) 0.25 MG disintegrating tablet Take 1 tablet (0.25 mg total) by mouth 2 (two) times daily. (Patient taking differently: Take 0.25 mg by mouth as needed.)   empagliflozin (JARDIANCE) 25 MG TABS tablet Take 1 tablet (25 mg total) by mouth daily.   glipiZIDE (GLUCOTROL) 5 MG tablet TAKE 1  TABLET (5 MG TOTAL) BY MOUTH TWICE A DAY BEFORE MEALS   hydrochlorothiazide (HYDRODIURIL) 25 MG tablet Take 1 tablet (25 mg total) by mouth daily.   losartan (COZAAR) 50 MG tablet TAKE 1 TABLET (50 MG TOTAL) BY MOUTH DAILY AT 10 PM.   metoprolol succinate (TOPROL-XL) 50 MG 24 hr tablet Take 1 tablet (50 mg total) by mouth daily. Take with or immediately following a meal.   No current facility-administered medications for this visit. (Other)   REVIEW OF SYSTEMS: ROS   Positive for: Gastrointestinal, Neurological, Musculoskeletal, Endocrine, Eyes Negative for: Constitutional, Skin, Genitourinary, HENT, Cardiovascular, Respiratory, Psychiatric, Allergic/Imm, Heme/Lymph Last edited by Kingsley Spittle, COT on 02/06/2022  8:38 AM.     ALLERGIES No Known Allergies  PAST MEDICAL HISTORY Past Medical History:  Diagnosis Date   Arthritis    Cataract    Mixed OU   Diabetes mellitus without complication ([place])    Diabetic retinopathy ([place])    NPDR OU   Hyperlipidemia    Hypertension    Hypertensive retinopathy    OU   Obesity    Stroke [facility])    Past Surgical History:  Procedure Laterality Date   BIOPSY  11/29/2019   Procedure: BIOPSY;  Surgeon: Irving Copas., MD;  Location: [facility];  Service: Gastroenterology;;   COLONOSCOPY WITH PROPOFOL N/A 11/29/2019   Procedure: COLONOSCOPY WITH  PROPOFOL;  Surgeon: Irving Copas., MD;  Location: [facility];  Service: Gastroenterology;  Laterality: N/A;   ENDOSCOPIC MUCOSAL RESECTION N/A 11/29/2019   Procedure: ENDOSCOPIC MUCOSAL RESECTION;  Surgeon: Rush Landmark Telford Nab., MD;  Location: [facility];  Service: Gastroenterology;  Laterality: N/A;   ESOPHAGOGASTRODUODENOSCOPY (EGD) WITH PROPOFOL N/A 11/29/2019   Procedure: ESOPHAGOGASTRODUODENOSCOPY (EGD) WITH PROPOFOL;  Surgeon: Rush Landmark Telford Nab., MD;  Location: [facility];  Service: Gastroenterology;  Laterality: N/A;   EYE SURGERY Left    had to have eye  flushed after getting costic sodium in L eye   HEMOSTASIS CLIP PLACEMENT  11/29/2019   Procedure: HEMOSTASIS CLIP PLACEMENT;  Surgeon: Irving Copas., MD;  Location: [facility];  Service: Gastroenterology;;   HERNIA REPAIR     HERNIA REPAIR     35 years ago   HOT HEMOSTASIS N/A 11/29/2019   Procedure: HOT HEMOSTASIS (ARGON PLASMA COAGULATION/BICAP);  Surgeon: Irving Copas., MD;  Location: [facility];  Service: Gastroenterology;  Laterality: N/A;   JOINT REPLACEMENT  2021   Left knee replacement   LEFT HEART CATH AND CORONARY ANGIOGRAPHY N/A 04/11/2019   Procedure: LEFT HEART CATH AND CORONARY ANGIOGRAPHY;  Surgeon: Adrian Prows, MD;  Location: [facility];  Service: Cardiovascular;  Laterality: N/A;   POLYPECTOMY  11/29/2019   Procedure: POLYPECTOMY;  Surgeon: Rush Landmark Telford Nab., MD;  Location: [facility];  Service: Gastroenterology;;   RENAL ANGIOGRAPHY Bilateral 04/11/2019   Procedure: RENAL ANGIOGRAPHY;  Surgeon: Adrian Prows, MD;  Location: [facility];  Service: Cardiovascular;  Laterality: Bilateral;   SUBMUCOSAL LIFTING INJECTION  11/29/2019   Procedure: SUBMUCOSAL LIFTING INJECTION;  Surgeon: Irving Copas., MD;  Location: [facility];  Service: Gastroenterology;;   TOTAL KNEE ARTHROPLASTY Left 08/01/2019   Procedure: LEFT TOTAL KNEE ARTHROPLASTY;  Surgeon: Meredith Pel, MD;  Location: [facility];  Service: Orthopedics;  Laterality: Left;   FAMILY HISTORY Family History  Problem Relation Age of Onset   Diabetes Mother    COPD Mother    Cancer Mother        unknown type    Heart disease Father    COPD Sister    Healthy Brother    Heart disease Brother    Healthy Son    Colon cancer Neg Hx    Colon polyps Neg Hx    Esophageal cancer Neg Hx    Rectal cancer Neg Hx    Stomach cancer Neg Hx    Inflammatory bowel disease Neg Hx    Liver disease Neg Hx    Pancreatic cancer Neg Hx    SOCIAL HISTORY Social History    Tobacco Use   Smoking status: Never   Smokeless tobacco: Never  Vaping Use   Vaping Use: Never used  Substance Use Topics   Alcohol use: Not Currently   Drug use: Never       OPHTHALMIC EXAM:  Base Eye Exam     Visual Acuity (Snellen - Linear)       Right Left   Dist [place] 20/25 20/40 -1   Dist ph [place] NI 20/30         Tonometry (Tonopen, 8:46 AM)       Right Left   Pressure 14 11         Pupils       Pupils Dark Light Shape React APD   Right PERRL 3 2 Round Brisk None   Left PERRL 3 2 Round Brisk None  Visual Fields (Counting fingers)       Left Right   Restrictions Total superior nasal deficiency Total superior temporal deficiency         Extraocular Movement       Right Left    Full, Ortho Full, Ortho         Neuro/Psych     Oriented x3: Yes   Mood/Affect: Normal         Dilation     Both eyes: 1.0% Mydriacyl, 2.5% Phenylephrine @ 8:47 AM           Slit Lamp and Fundus Exam     Slit Lamp Exam       Right Left   Lids/Lashes Dermatochalasis - upper lid, Meibomian gland dysfunction Dermatochalasis - upper lid, Meibomian gland dysfunction   Conjunctiva/Sclera White and quiet White and quiet   Cornea 1+Punctate epithelial erosions, well healed cataract wound 1+ Punctate epithelial erosions, well healed cataract wound   Anterior Chamber Deep and quiet, deep, clear, narrow temporal angle Deep and quiet   Iris Round and moderately dilated, No NVI Round and moderately dilated to 6.21m, No NVI, PPM nasal   Lens PC IOL in good position, trace Posterior capsular opacification PC IOL in good position, trace Posterior capsular opacification   Anterior Vitreous Vitreous syneresis, mild vitreous condensations Vitreous syneresis         Fundus Exam       Right Left   Disc mild Pallor, Sharp rim, mild PPA Pink and Sharp, +Peripapillary atrophy, Compact   C/D Ratio 0.2 0.1   Macula Flat, Blunted foveal reflex, scattered MA/IRH, RPE  mottling and clumping, trace cystic changes centrally -- slightly increased Flat, Blunted foveal reflex, rare MA, focal cluster of cystic changes superior macula, no exudates   Vessels attenuated, Tortuous attenuated, Tortuous   Periphery Attached, pigmented cystoid degeneration ST periphery, scattered MA/IRH greatest posteriorly, focal pigmented VR tuft at 0800 Attached, scattered MA greatest posteriorly, focal pigmented CR scarring along distal ST arcades (0130)           Refraction     Manifest Refraction       Sphere Cylinder Axis Dist VA   Right       Left -0.75 +0.25 035 20/30            IMAGING AND PROCEDURES  Imaging and Procedures for 02/06/2022  OCT, Retina - OU - Both Eyes       Right Eye Quality was good. Central Foveal Thickness: 361. Progression has worsened. Findings include no SRF, abnormal foveal contour, intraretinal hyper-reflective material, intraretinal fluid (Interval increase in IRF / cystic changes temporal fovea, very shallow schisis ST periphery caught on widefield ).   Left Eye Quality was good. Central Foveal Thickness: 352. Progression has worsened. Findings include no SRF, abnormal foveal contour, intraretinal hyper-reflective material, intraretinal fluid, vitreomacular adhesion (interval increase in IRF/IRHM, focal cystic changes superior macula and fovea).   Notes *Images captured and stored on drive  Diagnosis / Impression:  DME OU OD: Interval increase in IRF / cystic changes temporal fovea; very shallow schisis ST periphery caught on widefield -- stable OS: interval increase in IRF/IRHM, focal cystic changes superior macula and fovea  Clinical management:  See below  Abbreviations: NFP - Normal foveal profile. CME - cystoid macular edema. PED - pigment epithelial detachment. IRF - intraretinal fluid. SRF - subretinal fluid. EZ - ellipsoid zone. ERM - epiretinal membrane. ORA - outer retinal atrophy. ORT - outer retinal tubulation. SRHM -  subretinal hyper-reflective material. IRHM - intraretinal hyper-reflective material      Intravitreal Injection, Pharmacologic Agent - OS - Left Eye       Time Out 02/06/2022. 10:07 AM. Confirmed correct patient, procedure, site, and patient consented.   Anesthesia Topical anesthesia was used. Anesthetic medications included Lidocaine 2%, Proparacaine 0.5%.   Procedure Preparation included 5% betadine to ocular surface, eyelid speculum. A (32g) needle was used.   Injection: 1.25 mg Bevacizumab 1.35m/0.05ml   Route: Intravitreal, Site: Left Eye   NDC:: 28003-491-79 Lot:: [id number] Expiration date: 03/04/2022   Post-op Post injection exam found visual acuity of at least counting fingers. The patient tolerated the procedure well. There were no complications. The patient received written and verbal post procedure care education.            ASSESSMENT/PLAN:    ICD-10-CM   1. Acute CVA (cerebrovascular accident) ([place]  I63.9     2. Visual field loss following cerebrovascular accident  I69.398    H54.7     3. 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 Essential hypertension  I10     5. Hypertensive retinopathy of both eyes  H35.033     6. Pseudophakia, both eyes  Z96.1        1,2. 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  - BCVA improved to 20/25 OU -- post cataract sx  - baseline visual field obtained, 04.24.23 -- right homonymous superior quadrantanopia consistent w/ L PCA stroke  - monitor  3.  Moderate Non-proliferative diabetic retinopathy w/ DME OU  - pt lost to f/u from Mar 2022 to Feb 2023 (11 mos)  - S/P IVA OD #1 (10.26.21), #2 (11.24.21), #3 (12.22.21), #4 (03/06/20), #5 (03.23.22)  - S/P IVA OS #1 (03.23.22)             - A1c 7.4% 05.01.23, 8.3% on 03.14.23; 12.3% on 01.19.23, 8.3% on 12.03.21; 7.4% on 06.04.21 - BCVA OD stable at 20/25, OS decreased to 20/30 from 20/25 - exam shows scattered MA and mild edema/cystic changes OU - FA (09.29.21) shows late leaking MA OU; no NV OU  - OCT shows OD: Interval increase in IRF / cystic changes temporal fovea, very shallow schisis ST periphery caught on widefield; OS: interval increase in IRF/IRHM, focal cystic changes superior macula and fovea  - recommend IVA OS #2 today, 01.05.24  - pt wishes to proceed with injection  - RBA of procedure discussed, questions answered - IVA informed consent obtained and signed, 01.05.24 (OS) - see procedure note  - f/u 4 weeks, DFE, OCT  4,5. Hypertensive retinopathy OU - discussed importance of tight BP control - monitor  6. Pseudophakia OU  - s/p CE/IOL (Dr. SZenia Resides ~August 2023)  - IOL in good position, doing well  - monitor   Ophthalmic Meds Ordered this visit:  Meds ordered this encounter  Medications   Bevacizumab (AVASTIN) SOLN 1.25 mg    Return in about 4 weeks (around 03/06/2022) for f/u NPDR OU, DFE, OCT.  There are no Patient  Instructions on file for this visit.  This document serves as a record of services personally performed by Gardiner Sleeper, MD, PhD. It was created on their behalf by Bernarda Caffey, MD, an ophthalmic technician. The creation of this record is the provider's dictation and/or activities during the visit.    Electronically signed by: Bernarda Caffey, MD 02/06/2022 12:35 PM  Gardiner Sleeper, M.D., Ph.D. Diseases & Surgery of the Retina and Vitreous [facility]  I have reviewed the above documentation for accuracy and completeness,  and I agree with the above. Gardiner Sleeper, M.D., Ph.D. 02/06/22 12:35 PM  Abbreviations: M myopia (nearsighted); A astigmatism; H hyperopia (farsighted); P presbyopia; Mrx spectacle prescription;  CTL contact lenses; OD right eye; OS left eye; OU both eyes  XT exotropia; ET esotropia; PEK punctate epithelial keratitis; PEE punctate epithelial erosions; DES dry eye syndrome; MGD meibomian gland dysfunction; ATs artificial tears; PFAT's preservative free artificial tears; [place] nuclear sclerotic cataract; PSC posterior subcapsular cataract; ERM epi-retinal membrane; PVD posterior vitreous detachment; RD retinal detachment; DM diabetes mellitus; DR diabetic retinopathy; NPDR non-proliferative diabetic retinopathy; PDR proliferative diabetic retinopathy; CSME clinically significant macular edema; DME diabetic macular edema; dbh dot blot hemorrhages; CWS cotton wool spot; POAG primary open angle glaucoma; C/D cup-to-disc ratio; HVF humphrey visual field; GVF goldmann visual field; OCT optical coherence tomography; IOP intraocular pressure; BRVO Branch retinal vein occlusion; CRVO central retinal vein occlusion; CRAO central retinal artery occlusion; BRAO branch retinal artery occlusion; RT retinal tear; SB scleral buckle; PPV pars plana vitrectomy; VH Vitreous hemorrhage; PRP panretinal laser photocoagulation; IVK intravitreal kenalog; VMT vitreomacular traction; MH Macular hole;  NVD neovascularization of the disc; NVE neovascularization elsewhere; AREDS age related eye disease study; ARMD age related macular degeneration; POAG primary open angle glaucoma; EBMD epithelial/anterior basement membrane dystrophy; ACIOL anterior chamber intraocular lens; IOL intraocular lens; PCIOL posterior chamber intraocular lens; Phaco/IOL phacoemulsification with intraocular lens placement; [place] photorefractive keratectomy; LASIK laser assisted in situ keratomileusis; HTN hypertension; DM diabetes mellitus; COPD chronic obstructive  pulmonary disease

## 2022-02-06 ENCOUNTER — Ambulatory Visit (INDEPENDENT_AMBULATORY_CARE_PROVIDER_SITE_OTHER): Payer: Medicare Other | Admitting: Ophthalmology

## 2022-02-06 ENCOUNTER — Encounter (INDEPENDENT_AMBULATORY_CARE_PROVIDER_SITE_OTHER): Payer: Self-pay | Admitting: Ophthalmology

## 2022-02-06 DIAGNOSIS — I639 Cerebral infarction, unspecified: Secondary | ICD-10-CM

## 2022-02-06 DIAGNOSIS — I10 Essential (primary) hypertension: Secondary | ICD-10-CM

## 2022-02-06 DIAGNOSIS — H35033 Hypertensive retinopathy, bilateral: Secondary | ICD-10-CM

## 2022-02-06 DIAGNOSIS — H547 Unspecified visual loss: Secondary | ICD-10-CM | POA: Diagnosis not present

## 2022-02-06 DIAGNOSIS — Z961 Presence of intraocular lens: Secondary | ICD-10-CM

## 2022-02-06 DIAGNOSIS — E113313 Type 2 diabetes mellitus with moderate nonproliferative diabetic retinopathy with macular edema, bilateral: Secondary | ICD-10-CM

## 2022-02-06 MED ORDER — BEVACIZUMAB CHEMO INJECTION 1.25MG/0.05ML SYRINGE FOR KALEIDOSCOPE
1.2500 mg | INTRAVITREAL | Status: AC | PRN
  Administered 2022-02-06: 1.25 mg via INTRAVITREAL

## 2022-02-08 ENCOUNTER — Other Ambulatory Visit: Payer: Self-pay | Admitting: Cardiology

## 2022-02-08 DIAGNOSIS — I639 Cerebral infarction, unspecified: Secondary | ICD-10-CM

## 2022-02-08 DIAGNOSIS — I10 Essential (primary) hypertension: Secondary | ICD-10-CM

## 2022-02-09 ENCOUNTER — Ambulatory Visit (INDEPENDENT_AMBULATORY_CARE_PROVIDER_SITE_OTHER): Payer: Medicare Other

## 2022-02-09 ENCOUNTER — Telehealth: Payer: Self-pay | Admitting: Family Medicine

## 2022-02-09 DIAGNOSIS — I639 Cerebral infarction, unspecified: Secondary | ICD-10-CM | POA: Diagnosis not present

## 2022-02-09 DIAGNOSIS — I10 Essential (primary) hypertension: Secondary | ICD-10-CM

## 2022-02-09 NOTE — Telephone Encounter (Signed)
Please give  the pt a call about a earlier appt/ and refill on meds

## 2022-02-10 LAB — CUP PACEART REMOTE DEVICE CHECK
Date Time Interrogation Session: 20240107230820
Implantable Pulse Generator Implant Date: 20230407

## 2022-02-10 MED ORDER — LOSARTAN POTASSIUM 50 MG PO TABS: 50.0000 mg | ORAL_TABLET | Freq: Every day | ORAL | 0 refills | Status: DC

## 2022-02-10 MED ORDER — AMLODIPINE BESYLATE 10 MG PO TABS: 10.0000 mg | ORAL_TABLET | Freq: Every day | ORAL | 0 refills | Status: DC

## 2022-02-10 MED ORDER — ATORVASTATIN CALCIUM 80 MG PO TABS: 80.0000 mg | ORAL_TABLET | Freq: Every day | ORAL | 1 refills | Status: DC

## 2022-02-10 NOTE — Telephone Encounter (Signed)
Spoke with patient and he requested refills on Atorvastatin, Losartan and amlodipine. Chart supports rx. Last OV: 01/13/2022 Next OV: 04/14/2022

## 2022-02-12 NOTE — Progress Notes (Signed)
[facility] Summary Report / Loop Recorder 

## 2022-02-18 ENCOUNTER — Other Ambulatory Visit: Payer: Self-pay | Admitting: Family Medicine

## 2022-02-18 DIAGNOSIS — E119 Type 2 diabetes mellitus without complications: Secondary | ICD-10-CM

## 2022-02-18 DIAGNOSIS — I10 Essential (primary) hypertension: Secondary | ICD-10-CM

## 2022-02-18 NOTE — Telephone Encounter (Signed)
Chart supports rx. Last OV: 01/13/2022 Next OV: 04/14/2022

## 2022-03-05 NOTE — Progress Notes (Shared)
[facility] Note  03/06/2022    CHIEF COMPLAINT Patient presents for No chief complaint on file.   HISTORY OF PRESENT ILLNESS: Joe Stewart is a 67 y.o. male who presents to the clinic today for:   Pt feels like left eye vision is down, his last A1c was 6.9 on 12.12.23  Referring physician: Shirleen Schirmer, [address] [place], [place] [address]  HISTORICAL INFORMATION:   Selected notes from the MEDICAL RECORD NUMBER Referred by [name], PA LEE: 09.15.21 BCVA OD: 20/20 OS: 20/20 Ocular Hx- NPDR with edema  PMH- DM, HTN   CURRENT MEDICATIONS: No current outpatient medications on file. (Ophthalmic Drugs)   No current facility-administered medications for this visit. (Ophthalmic Drugs)   Current Outpatient Medications (Other)  Medication Sig   amLODipine (NORVASC) 10 MG tablet Take 1 tablet (10 mg total) by mouth daily.   aspirin EC 81 MG tablet Take 1 tablet (81 mg total) by mouth daily. Swallow whole.   atorvastatin (LIPITOR) 80 MG tablet Take 1 tablet (80 mg total) by mouth daily.   blood glucose meter kit and supplies KIT Dispense based on patient and insurance preference. Use up to four times daily as directed.   clonazePAM (KLONOPIN) 0.25 MG disintegrating tablet Take 1 tablet (0.25 mg total) by mouth 2 (two) times daily. (Patient taking differently: Take 0.25 mg by mouth as needed.)   glipiZIDE (GLUCOTROL) 5 MG tablet TAKE 1 TABLET (5 MG TOTAL) BY MOUTH TWICE A DAY BEFORE MEALS   hydrochlorothiazide (HYDRODIURIL) 25 MG tablet Take 1 tablet (25 mg total) by mouth daily.   JARDIANCE 25 MG TABS tablet TAKE 1 TABLET (25 MG TOTAL) BY MOUTH DAILY.   losartan (COZAAR) 50 MG tablet Take 1 tablet (50 mg total) by mouth daily at 10 pm.   metoprolol succinate (TOPROL-XL) 50 MG 24 hr tablet Take 1 tablet (50 mg total) by mouth daily. Take with or immediately following a meal.   No current facility-administered medications for this  visit. (Other)   REVIEW OF SYSTEMS:   ALLERGIES No Known Allergies  PAST MEDICAL HISTORY Past Medical History:  Diagnosis Date   Arthritis    Cataract    Mixed OU   Diabetes mellitus without complication ([place])    Diabetic retinopathy ([place])    NPDR OU   Hyperlipidemia    Hypertension    Hypertensive retinopathy    OU   Obesity    Stroke [facility])    Past Surgical History:  Procedure Laterality Date   BIOPSY  11/29/2019   Procedure: BIOPSY;  Surgeon: Irving Copas., MD;  Location: [facility];  Service: Gastroenterology;;   COLONOSCOPY WITH PROPOFOL N/A 11/29/2019   Procedure: COLONOSCOPY WITH PROPOFOL;  Surgeon: Irving Copas., MD;  Location: [facility];  Service: Gastroenterology;  Laterality: N/A;   ENDOSCOPIC MUCOSAL RESECTION N/A 11/29/2019   Procedure: ENDOSCOPIC MUCOSAL RESECTION;  Surgeon: Rush Landmark Telford Nab., MD;  Location: [facility];  Service: Gastroenterology;  Laterality: N/A;   ESOPHAGOGASTRODUODENOSCOPY (EGD) WITH PROPOFOL N/A 11/29/2019   Procedure: ESOPHAGOGASTRODUODENOSCOPY (EGD) WITH PROPOFOL;  Surgeon: Rush Landmark Telford Nab., MD;  Location: [facility];  Service: Gastroenterology;  Laterality: N/A;   EYE SURGERY Left    had to have eye flushed after getting costic sodium in L eye   HEMOSTASIS CLIP PLACEMENT  11/29/2019   Procedure: HEMOSTASIS CLIP PLACEMENT;  Surgeon: Irving Copas., MD;  Location: [facility];  Service: Gastroenterology;;   HERNIA REPAIR  HERNIA REPAIR     35 years ago   HOT HEMOSTASIS N/A 11/29/2019   Procedure: HOT HEMOSTASIS (ARGON PLASMA COAGULATION/BICAP);  Surgeon: Irving Copas., MD;  Location: [facility];  Service: Gastroenterology;  Laterality: N/A;   JOINT REPLACEMENT  2021   Left knee replacement   LEFT HEART CATH AND CORONARY ANGIOGRAPHY N/A 04/11/2019   Procedure: LEFT HEART CATH AND CORONARY ANGIOGRAPHY;  Surgeon: Adrian Prows, MD;  Location: [facility];  Service:  Cardiovascular;  Laterality: N/A;   POLYPECTOMY  11/29/2019   Procedure: POLYPECTOMY;  Surgeon: Rush Landmark Telford Nab., MD;  Location: [facility];  Service: Gastroenterology;;   RENAL ANGIOGRAPHY Bilateral 04/11/2019   Procedure: RENAL ANGIOGRAPHY;  Surgeon: Adrian Prows, MD;  Location: [facility];  Service: Cardiovascular;  Laterality: Bilateral;   SUBMUCOSAL LIFTING INJECTION  11/29/2019   Procedure: SUBMUCOSAL LIFTING INJECTION;  Surgeon: Irving Copas., MD;  Location: [facility];  Service: Gastroenterology;;   TOTAL KNEE ARTHROPLASTY Left 08/01/2019   Procedure: LEFT TOTAL KNEE ARTHROPLASTY;  Surgeon: Meredith Pel, MD;  Location: [facility];  Service: Orthopedics;  Laterality: Left;   FAMILY HISTORY Family History  Problem Relation Age of Onset   Diabetes Mother    COPD Mother    Cancer Mother        unknown type    Heart disease Father    COPD Sister    Healthy Brother    Heart disease Brother    Healthy Son    Colon cancer Neg Hx    Colon polyps Neg Hx    Esophageal cancer Neg Hx    Rectal cancer Neg Hx    Stomach cancer Neg Hx    Inflammatory bowel disease Neg Hx    Liver disease Neg Hx    Pancreatic cancer Neg Hx    SOCIAL HISTORY Social History   Tobacco Use   Smoking status: Never   Smokeless tobacco: Never  Vaping Use   Vaping Use: Never used  Substance Use Topics   Alcohol use: Not Currently   Drug use: Never       OPHTHALMIC EXAM:  Not recorded     IMAGING AND PROCEDURES  Imaging and Procedures for 03/06/2022          ASSESSMENT/PLAN:  No diagnosis found.    1,2. 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  - BCVA improved to 20/25 OU -- post cataract sx  - baseline visual  field obtained, 04.24.23 -- right homonymous superior quadrantanopia consistent w/ L PCA stroke  - monitor  3. Moderate Non-proliferative diabetic retinopathy w/ DME OU  - pt lost to f/u from Mar 2022 to Feb 2023 (11 mos)  - S/P IVA OD #1 (10.26.21), #2 (11.24.21), #3 (12.22.21), #4 (03/06/20), #5 (03.23.22)  - S/P IVA OS #1 (03.23.22)             - A1c 7.4% 05.01.23, 8.3% on 03.14.23; 12.3% on 01.19.23, 8.3% on 12.03.21; 7.4% on 06.04.21 - BCVA OD stable at 20/25, OS decreased to 20/30 from 20/25 - exam shows scattered MA and mild edema/cystic changes OU - FA (09.29.21) shows late leaking MA OU; no NV OU  - OCT shows OD: Interval increase in IRF / cystic changes temporal fovea, very shallow schisis ST periphery caught  on widefield; OS: interval increase in IRF/IRHM, focal cystic changes superior macula and fovea  - recommend IVA OS #2 today, 01.05.24  - pt wishes to proceed with injection  - RBA of procedure discussed, questions answered - IVA informed consent obtained and signed, 01.05.24 (OS) - see procedure note  - f/u 4 weeks, DFE, OCT  4,5. Hypertensive retinopathy OU - discussed importance of tight BP control - monitor  6. Pseudophakia OU  - s/p CE/IOL (Dr. Zenia Resides, ~August 2023)  - IOL in good position, doing well  - monitor   Ophthalmic Meds Ordered this visit:  No orders of the defined types were placed in this encounter.   No follow-ups on file.  There are no Patient Instructions on file for this visit.  This document serves as a record of services personally performed by Gardiner Sleeper, MD, PhD. It was created on their behalf by Joetta Manners COT, an ophthalmic technician. The creation of this record is the provider's dictation and/or activities during the visit.    Electronically signed by: Joetta Manners COT 02/01/20248:50 AM   Abbreviations: M myopia (nearsighted); A astigmatism; H hyperopia (farsighted); P presbyopia; Mrx spectacle prescription;   CTL contact lenses; OD right eye; OS left eye; OU both eyes  XT exotropia; ET esotropia; PEK punctate epithelial keratitis; PEE punctate epithelial erosions; DES dry eye syndrome; MGD meibomian gland dysfunction; ATs artificial tears; PFAT's preservative free artificial tears; [place] nuclear sclerotic cataract; PSC posterior subcapsular cataract; ERM epi-retinal membrane; PVD posterior vitreous detachment; RD retinal detachment; DM diabetes mellitus; DR diabetic retinopathy; NPDR non-proliferative diabetic retinopathy; PDR proliferative diabetic retinopathy; CSME clinically significant macular edema; DME diabetic macular edema; dbh dot blot hemorrhages; CWS cotton wool spot; POAG primary open angle glaucoma; C/D cup-to-disc ratio; HVF humphrey visual field; GVF goldmann visual field; OCT optical coherence tomography; IOP intraocular pressure; BRVO Branch retinal vein occlusion; CRVO central retinal vein occlusion; CRAO central retinal artery occlusion; BRAO branch retinal artery occlusion; RT retinal tear; SB scleral buckle; PPV pars plana vitrectomy; VH Vitreous hemorrhage; PRP panretinal laser photocoagulation; IVK intravitreal kenalog; VMT vitreomacular traction; MH Macular hole;  NVD neovascularization of the disc; NVE neovascularization elsewhere; AREDS age related eye disease study; ARMD age related macular degeneration; POAG primary open angle glaucoma; EBMD epithelial/anterior basement membrane dystrophy; ACIOL anterior chamber intraocular lens; IOL intraocular lens; PCIOL posterior chamber intraocular lens; Phaco/IOL phacoemulsification with intraocular lens placement; [place] photorefractive keratectomy; LASIK laser assisted in situ keratomileusis; HTN hypertension; DM diabetes mellitus; COPD chronic obstructive pulmonary disease

## 2022-03-06 ENCOUNTER — Encounter (INDEPENDENT_AMBULATORY_CARE_PROVIDER_SITE_OTHER): Payer: Self-pay

## 2022-03-06 ENCOUNTER — Encounter (INDEPENDENT_AMBULATORY_CARE_PROVIDER_SITE_OTHER): Payer: Medicare Other | Admitting: Ophthalmology

## 2022-03-06 DIAGNOSIS — E113313 Type 2 diabetes mellitus with moderate nonproliferative diabetic retinopathy with macular edema, bilateral: Secondary | ICD-10-CM

## 2022-03-06 DIAGNOSIS — H35033 Hypertensive retinopathy, bilateral: Secondary | ICD-10-CM

## 2022-03-06 DIAGNOSIS — H25813 Combined forms of age-related cataract, bilateral: Secondary | ICD-10-CM

## 2022-03-06 DIAGNOSIS — I639 Cerebral infarction, unspecified: Secondary | ICD-10-CM

## 2022-03-06 DIAGNOSIS — Z961 Presence of intraocular lens: Secondary | ICD-10-CM

## 2022-03-06 DIAGNOSIS — I10 Essential (primary) hypertension: Secondary | ICD-10-CM

## 2022-03-06 DIAGNOSIS — H547 Unspecified visual loss: Secondary | ICD-10-CM

## 2022-03-16 ENCOUNTER — Ambulatory Visit: Payer: Medicare Other

## 2022-03-16 DIAGNOSIS — I639 Cerebral infarction, unspecified: Secondary | ICD-10-CM | POA: Diagnosis not present

## 2022-03-17 LAB — CUP PACEART REMOTE DEVICE CHECK
Date Time Interrogation Session: 20240209230619
Implantable Pulse Generator Implant Date: 20230407

## 2022-03-19 NOTE — Progress Notes (Signed)
[facility] Summary Report / Loop Recorder 

## 2022-03-19 NOTE — Progress Notes (Signed)
[facility] Note  03/20/2022    CHIEF COMPLAINT Patient presents for Retina Follow Up   HISTORY OF PRESENT ILLNESS: Joe Stewart is a 67 y.o. male who presents to the clinic today for:  HPI     Retina Follow Up   Patient presents with  Diabetic Retinopathy.  In both eyes.  This started 4 weeks ago.  Duration of 4 weeks.  Since onset it is stable.  I, the attending physician,  performed the HPI with the patient and updated documentation appropriately.        Comments   4 week retina follow up NPDR OU amd IVA OS pt is reporting no vision changes noticed last blood sugar check 126 this am       Last edited by Bernarda Caffey, MD on 03/20/2022 11:41 AM.     Pt is delayed to follow up from 4 weeks to 6 weeks due to not having transportation, he felt like both eyes were fuzzy this morning, he states his blood sugar numbers are doing well  Referring physician: Shirleen Schirmer, [place], [place] [id number]  HISTORICAL INFORMATION:   Selected notes from the MEDICAL RECORD NUMBER Referred by [name], PA LEE: 09.15.21 BCVA OD: 20/20 OS: 20/20 Ocular Hx- NPDR with edema  PMH- DM, HTN   CURRENT MEDICATIONS: No current outpatient medications on file. (Ophthalmic Drugs)   No current facility-administered medications for this visit. (Ophthalmic Drugs)   Current Outpatient Medications (Other)  Medication Sig   amLODipine (NORVASC) 10 MG tablet Take 1 tablet (10 mg total) by mouth daily.   aspirin EC 81 MG tablet Take 1 tablet (81 mg total) by mouth daily. Swallow whole.   atorvastatin (LIPITOR) 80 MG tablet Take 1 tablet (80 mg total) by mouth daily.   blood glucose meter kit and supplies KIT Dispense based on patient and insurance preference. Use up to four times daily as directed.   clonazePAM (KLONOPIN) 0.25 MG disintegrating tablet Take 1 tablet (0.25 mg total) by mouth 2 (two) times daily. (Patient taking differently: Take  0.25 mg by mouth as needed.)   glipiZIDE (GLUCOTROL) 5 MG tablet TAKE 1 TABLET (5 MG TOTAL) BY MOUTH TWICE A DAY BEFORE MEALS   hydrochlorothiazide (HYDRODIURIL) 25 MG tablet Take 1 tablet (25 mg total) by mouth daily.   JARDIANCE 25 MG TABS tablet TAKE 1 TABLET (25 MG TOTAL) BY MOUTH DAILY.   losartan (COZAAR) 50 MG tablet Take 1 tablet (50 mg total) by mouth daily at 10 pm.   metoprolol succinate (TOPROL-XL) 50 MG 24 hr tablet Take 1 tablet (50 mg total) by mouth daily. Take with or immediately following a meal.   No current facility-administered medications for this visit. (Other)   REVIEW OF SYSTEMS: ROS   Positive for: Gastrointestinal, Neurological, Musculoskeletal, Endocrine, Eyes Negative for: Constitutional, Skin, Genitourinary, HENT, Cardiovascular, Respiratory, Psychiatric, Allergic/Imm, Heme/Lymph Last edited by Parthenia Ames, COT on 03/20/2022  7:41 AM.     ALLERGIES No Known Allergies  PAST MEDICAL HISTORY Past Medical History:  Diagnosis Date   Arthritis    Cataract    Mixed OU   Diabetes mellitus without complication ([place])    Diabetic retinopathy ([place])    NPDR OU   Hyperlipidemia    Hypertension    Hypertensive retinopathy    OU   Obesity    Stroke [facility])    Past Surgical History:  Procedure Laterality Date   BIOPSY  11/29/2019   Procedure: BIOPSY;  Surgeon: Irving Copas., MD;  Location: [facility];  Service: Gastroenterology;;   COLONOSCOPY WITH PROPOFOL N/A 11/29/2019   Procedure: COLONOSCOPY WITH PROPOFOL;  Surgeon: Irving Copas., MD;  Location: [facility];  Service: Gastroenterology;  Laterality: N/A;   ENDOSCOPIC MUCOSAL RESECTION N/A 11/29/2019   Procedure: ENDOSCOPIC MUCOSAL RESECTION;  Surgeon: Rush Landmark Telford Nab., MD;  Location: [facility];  Service: Gastroenterology;  Laterality: N/A;   ESOPHAGOGASTRODUODENOSCOPY (EGD) WITH PROPOFOL N/A 11/29/2019   Procedure: ESOPHAGOGASTRODUODENOSCOPY (EGD) WITH PROPOFOL;   Surgeon: Rush Landmark Telford Nab., MD;  Location: [facility];  Service: Gastroenterology;  Laterality: N/A;   EYE SURGERY Left    had to have eye flushed after getting costic sodium in L eye   HEMOSTASIS CLIP PLACEMENT  11/29/2019   Procedure: HEMOSTASIS CLIP PLACEMENT;  Surgeon: Irving Copas., MD;  Location: [facility];  Service: Gastroenterology;;   HERNIA REPAIR     HERNIA REPAIR     35 years ago   HOT HEMOSTASIS N/A 11/29/2019   Procedure: HOT HEMOSTASIS (ARGON PLASMA COAGULATION/BICAP);  Surgeon: Irving Copas., MD;  Location: [facility];  Service: Gastroenterology;  Laterality: N/A;   JOINT REPLACEMENT  2021   Left knee replacement   LEFT HEART CATH AND CORONARY ANGIOGRAPHY N/A 04/11/2019   Procedure: LEFT HEART CATH AND CORONARY ANGIOGRAPHY;  Surgeon: Adrian Prows, MD;  Location: [facility];  Service: Cardiovascular;  Laterality: N/A;   POLYPECTOMY  11/29/2019   Procedure: POLYPECTOMY;  Surgeon: Rush Landmark Telford Nab., MD;  Location: [facility];  Service: Gastroenterology;;   RENAL ANGIOGRAPHY Bilateral 04/11/2019   Procedure: RENAL ANGIOGRAPHY;  Surgeon: Adrian Prows, MD;  Location: [facility];  Service: Cardiovascular;  Laterality: Bilateral;   SUBMUCOSAL LIFTING INJECTION  11/29/2019   Procedure: SUBMUCOSAL LIFTING INJECTION;  Surgeon: Irving Copas., MD;  Location: [facility];  Service: Gastroenterology;;   TOTAL KNEE ARTHROPLASTY Left 08/01/2019   Procedure: LEFT TOTAL KNEE ARTHROPLASTY;  Surgeon: Meredith Pel, MD;  Location: [facility];  Service: Orthopedics;  Laterality: Left;   FAMILY HISTORY Family History  Problem Relation Age of Onset   Diabetes Mother    COPD Mother    Cancer Mother        unknown type    Heart disease Father    COPD Sister    Healthy Brother    Heart disease Brother    Healthy Son    Colon cancer Neg Hx    Colon polyps Neg Hx    Esophageal cancer Neg Hx    Rectal cancer Neg Hx    Stomach  cancer Neg Hx    Inflammatory bowel disease Neg Hx    Liver disease Neg Hx    Pancreatic cancer Neg Hx    SOCIAL HISTORY Social History   Tobacco Use   Smoking status: Never   Smokeless tobacco: Never  Vaping Use   Vaping Use: Never used  Substance Use Topics   Alcohol use: Not Currently   Drug use: Never       OPHTHALMIC EXAM:  Base Eye Exam     Visual Acuity (Snellen - Linear)       Right Left   Dist [place] 20/30 20/40 -2   Dist ph  NI NI         Tonometry (Tonopen, 7:46 AM)       Right Left   Pressure 12 13         Pupils  Pupils Dark Light Shape React APD   Right PERRL 3 2 Round Brisk None   Left PERRL 3 2 Round Brisk None         Visual Fields       Left Right   Restrictions Total superior nasal deficiency Total superior temporal deficiency         Extraocular Movement       Right Left    Full, Ortho Full, Ortho         Neuro/Psych     Oriented x3: Yes   Mood/Affect: Normal           Slit Lamp and Fundus Exam     Slit Lamp Exam       Right Left   Lids/Lashes Dermatochalasis - upper lid, Meibomian gland dysfunction Dermatochalasis - upper lid, Meibomian gland dysfunction   Conjunctiva/Sclera White and quiet White and quiet   Cornea 1+Punctate epithelial erosions, well healed cataract wound 1+ Punctate epithelial erosions, well healed cataract wound   Anterior Chamber Deep and quiet, deep, clear, narrow temporal angle Deep and quiet   Iris Round and moderately dilated, No NVI Round and moderately dilated to 6.62m, No NVI, PPM nasal   Lens PC IOL in good position, trace Posterior capsular opacification PC IOL in good position, trace Posterior capsular opacification   Anterior Vitreous Vitreous syneresis, mild vitreous condensations Vitreous syneresis         Fundus Exam       Right Left   Disc mild Pallor, Sharp rim, mild PPA Pink and Sharp, +Peripapillary atrophy, Compact   C/D Ratio 0.2 0.1   Macula Flat, Blunted  foveal reflex, scattered MA/IRH, RPE mottling and clumping, trace cystic changes centrally -- improved Flat, Blunted foveal reflex, rare MA, focal cluster of cystic changes superior macula -- improved, no exudates   Vessels attenuated, Tortuous attenuated, Tortuous   Periphery Attached, pigmented cystoid degeneration ST periphery, scattered MA/IRH greatest posteriorly, focal pigmented VR tuft at 0800 Attached, scattered MA greatest posteriorly, focal pigmented CR scarring along distal ST arcades (0130)            IMAGING AND PROCEDURES  Imaging and Procedures for 03/20/2022  OCT, Retina - OU - Both Eyes       Right Eye Quality was good. Central Foveal Thickness: 340. Progression has improved. Findings include normal foveal contour, no SRF, intraretinal hyper-reflective material, intraretinal fluid (Interval improvement in central IRF and foveal contour -- trace residual cystic changes, very shallow schisis ST periphery caught on widefield--not imaged today).   Left Eye Quality was good. Central Foveal Thickness: 334. Progression has improved. Findings include no SRF, abnormal foveal contour, intraretinal hyper-reflective material, intraretinal fluid, vitreomacular adhesion (interval improvement in IRF and fovea contour greatet superior macula and fovea).   Notes *Images captured and stored on drive  Diagnosis / Impression:  DME OU OD: Interval improvement in central IRF and foveal contour -- trace residual cystic changes, very shallow schisis ST periphery caught on widefield--not imaged today OS: interval improvement in IRF and fovea contour greatest superior macula and fovea  Clinical management:  See below  Abbreviations: NFP - Normal foveal profile. CME - cystoid macular edema. PED - pigment epithelial detachment. IRF - intraretinal fluid. SRF - subretinal fluid. EZ - ellipsoid zone. ERM - epiretinal membrane. ORA - outer retinal atrophy. ORT - outer retinal tubulation. SRHM -  subretinal hyper-reflective material. IRHM - intraretinal hyper-reflective material      Intravitreal Injection, Pharmacologic Agent - OS -  Left Eye       Time Out 03/20/2022. 8:14 AM. Confirmed correct patient, procedure, site, and patient consented.   Anesthesia Topical anesthesia was used. Anesthetic medications included Lidocaine 2%, Proparacaine 0.5%.   Procedure Preparation included 5% betadine to ocular surface, eyelid speculum. A (32g) needle was used.   Injection: 1.25 mg Bevacizumab 1.64m/0.05ml   Route: Intravitreal, Site: Left Eye   NDC:[id number] Expiration date: 06/13/2022   Post-op Post injection exam found visual acuity of at least counting fingers. The patient tolerated the procedure well. There were no complications. The patient received written and verbal post procedure care education.            ASSESSMENT/PLAN:    ICD-10-CM   1. Acute CVA (cerebrovascular accident) ([place]  I63.9     2. Visual field loss following cerebrovascular accident  I69.398    H54.7     3. 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 Essential hypertension  I10     5. Hypertensive retinopathy of both eyes  H35.033     6. Pseudophakia, both eyes  Z96.1      1,2. 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  - BCVA was improved to 20/25 OU post cataract sx, but slightly down today -- 20/30 OD, 20/40 OS  - baseline visual field obtained, 04.24.23 -- right homonymous superior quadrantanopia  consistent w/ L PCA stroke  - monitor  3. Moderate Non-proliferative diabetic retinopathy w/ DME OU  - delayed f/u today -- 6 wks instead of 4  - pt lost to f/u from Mar 2022 to Feb 2023 (11 mos)  - S/P IVA OD #1 (10.26.21), #2 (11.24.21), #3 (12.22.21), #4 (03/06/20), #5 (03.23.22)  - S/P IVA OS #1 (03.23.22), #2 (01.05.24)             - A1c 6.9% 12.12.23; Max 12.3% on 01.19.23 - BCVA 20/30 OD, 20/40 OS -- both decreased - exam shows scattered MA and mild edema/cystic changes OU -- improving - FA (09.29.21) shows late leaking MA OU; no NV OU  - OCT shows OD: Interval improvement in central IRF and foveal contour -- trace residual cystic changes; OS: interval improvement in IRF and fovea contour greatet superior macula and fovea at 6 weeks  - recommend IVA OS #3 today, 02.16.24 with follow up at 6 weeks  - pt wishes to proceed with injection  - RBA of procedure discussed, questions answered - IVA informed consent obtained and signed, 01.05.24 (OS) - see procedure note  - f/u 6 weeks, DFE, OCT  4,5. Hypertensive retinopathy OU - discussed importance of tight BP control - monitor  6. Pseudophakia OU  - s/p CE/IOL (Dr. SZenia Resides ~August 2023)  - IOL in good position, doing well  - monitor   Ophthalmic Meds Ordered this visit:  Meds ordered this encounter  Medications   Bevacizumab (AVASTIN) SOLN 1.25 mg    Return in about 6 weeks (around 05/01/2022) for f/u NPDR OU, DFE, OCT.  There are no Patient Instructions on file  for this visit.  This document serves as a record of services personally performed by Gardiner Sleeper, MD, PhD. It was created on their behalf by Joetta Manners COT, an ophthalmic technician. The creation of this record is the provider's dictation and/or activities during the visit.    Electronically signed by: Joetta Manners COT 03/19/2022 11:50 AM  This document serves as a record of services personally performed by Gardiner Sleeper, MD, PhD. It was  created on their behalf by San Jetty. Owens Shark, OA an ophthalmic technician. The creation of this record is the provider's dictation and/or activities during the visit.    Electronically signed by: San Jetty. Marguerita Merles 02.16.2024 11:50 AM  Gardiner Sleeper, M.D., Ph.D. Diseases & Surgery of the Retina and [place] 03/20/2022   I have reviewed the above documentation for accuracy and completeness, and I agree with the above. Gardiner Sleeper, M.D., Ph.D. 03/20/22 11:54 AM   Abbreviations: M myopia (nearsighted); A astigmatism; H hyperopia (farsighted); P presbyopia; Mrx spectacle prescription;  CTL contact lenses; OD right eye; OS left eye; OU both eyes  XT exotropia; ET esotropia; PEK punctate epithelial keratitis; PEE punctate epithelial erosions; DES dry eye syndrome; MGD meibomian gland dysfunction; ATs artificial tears; PFAT's preservative free artificial tears; [place] nuclear sclerotic cataract; PSC posterior subcapsular cataract; ERM epi-retinal membrane; PVD posterior vitreous detachment; RD retinal detachment; DM diabetes mellitus; DR diabetic retinopathy; NPDR non-proliferative diabetic retinopathy; PDR proliferative diabetic retinopathy; CSME clinically significant macular edema; DME diabetic macular edema; dbh dot blot hemorrhages; CWS cotton wool spot; POAG primary open angle glaucoma; C/D cup-to-disc ratio; HVF humphrey visual field; GVF goldmann visual field; OCT optical coherence tomography; IOP intraocular pressure; BRVO Branch retinal vein occlusion; CRVO central retinal vein occlusion; CRAO central retinal artery occlusion; BRAO branch retinal artery occlusion; RT retinal tear; SB scleral buckle; PPV pars plana vitrectomy; VH Vitreous hemorrhage; PRP panretinal laser photocoagulation; IVK intravitreal kenalog; VMT vitreomacular traction; MH Macular hole;  NVD neovascularization of the disc; NVE neovascularization elsewhere; AREDS age related eye disease study;  ARMD age related macular degeneration; POAG primary open angle glaucoma; EBMD epithelial/anterior basement membrane dystrophy; ACIOL anterior chamber intraocular lens; IOL intraocular lens; PCIOL posterior chamber intraocular lens; Phaco/IOL phacoemulsification with intraocular lens placement; [place] photorefractive keratectomy; LASIK laser assisted in situ keratomileusis; HTN hypertension; DM diabetes mellitus; COPD chronic obstructive pulmonary disease

## 2022-03-20 ENCOUNTER — Ambulatory Visit (INDEPENDENT_AMBULATORY_CARE_PROVIDER_SITE_OTHER): Payer: Medicare Other | Admitting: Ophthalmology

## 2022-03-20 ENCOUNTER — Encounter (INDEPENDENT_AMBULATORY_CARE_PROVIDER_SITE_OTHER): Payer: Self-pay | Admitting: Ophthalmology

## 2022-03-20 DIAGNOSIS — H547 Unspecified visual loss: Secondary | ICD-10-CM

## 2022-03-20 DIAGNOSIS — H35033 Hypertensive retinopathy, bilateral: Secondary | ICD-10-CM | POA: Diagnosis not present

## 2022-03-20 DIAGNOSIS — E113313 Type 2 diabetes mellitus with moderate nonproliferative diabetic retinopathy with macular edema, bilateral: Secondary | ICD-10-CM

## 2022-03-20 DIAGNOSIS — I10 Essential (primary) hypertension: Secondary | ICD-10-CM | POA: Diagnosis not present

## 2022-03-20 DIAGNOSIS — I639 Cerebral infarction, unspecified: Secondary | ICD-10-CM

## 2022-03-20 DIAGNOSIS — Z961 Presence of intraocular lens: Secondary | ICD-10-CM

## 2022-03-20 MED ORDER — BEVACIZUMAB CHEMO INJECTION 1.25MG/0.05ML SYRINGE FOR KALEIDOSCOPE
1.2500 mg | INTRAVITREAL | Status: AC | PRN
  Administered 2022-03-20: 1.25 mg via INTRAVITREAL

## 2022-04-14 ENCOUNTER — Encounter: Payer: Self-pay | Admitting: Family Medicine

## 2022-04-14 ENCOUNTER — Ambulatory Visit (INDEPENDENT_AMBULATORY_CARE_PROVIDER_SITE_OTHER): Payer: Medicare Other | Admitting: Family Medicine

## 2022-04-14 VITALS — BP 136/82 | HR 72 | Temp 97.4°F | Wt 261.8 lb

## 2022-04-14 DIAGNOSIS — E1159 Type 2 diabetes mellitus with other circulatory complications: Secondary | ICD-10-CM | POA: Diagnosis not present

## 2022-04-14 DIAGNOSIS — E785 Hyperlipidemia, unspecified: Secondary | ICD-10-CM

## 2022-04-14 DIAGNOSIS — E66813 Obesity, class 3: Secondary | ICD-10-CM | POA: Insufficient documentation

## 2022-04-14 DIAGNOSIS — E1122 Type 2 diabetes mellitus with diabetic chronic kidney disease: Secondary | ICD-10-CM | POA: Diagnosis not present

## 2022-04-14 DIAGNOSIS — I2510 Atherosclerotic heart disease of native coronary artery without angina pectoris: Secondary | ICD-10-CM

## 2022-04-14 DIAGNOSIS — E1169 Type 2 diabetes mellitus with other specified complication: Secondary | ICD-10-CM

## 2022-04-14 DIAGNOSIS — I639 Cerebral infarction, unspecified: Secondary | ICD-10-CM

## 2022-04-14 DIAGNOSIS — R040 Epistaxis: Secondary | ICD-10-CM | POA: Insufficient documentation

## 2022-04-14 DIAGNOSIS — N1831 Chronic kidney disease, stage 3a: Secondary | ICD-10-CM | POA: Diagnosis not present

## 2022-04-14 DIAGNOSIS — I152 Hypertension secondary to endocrine disorders: Secondary | ICD-10-CM

## 2022-04-14 LAB — COMPREHENSIVE METABOLIC PANEL
ALT: 21 U/L (ref 0–53)
AST: 24 U/L (ref 0–37)
Albumin: 4.2 g/dL (ref 3.5–5.2)
Alkaline Phosphatase: 83 U/L (ref 39–117)
BUN: 24 mg/dL — ABNORMAL HIGH (ref 6–23)
CO2: 26 mEq/L (ref 19–32)
Calcium: 9.7 mg/dL (ref 8.4–10.5)
Chloride: 103 mEq/L (ref 96–112)
Creatinine, Ser: 1.17 mg/dL (ref 0.40–1.50)
GFR: 64.02 mL/min (ref >60.00)
Glucose, Bld: 166 mg/dL — ABNORMAL HIGH (ref 70–99)
Potassium: 4.5 mEq/L (ref 3.5–5.1)
Sodium: 139 mEq/L (ref 135–145)
Total Bilirubin: 0.6 mg/dL (ref 0.2–1.2)
Total Protein: 7.5 g/dL (ref 6.0–8.3)

## 2022-04-14 LAB — URINALYSIS, ROUTINE W REFLEX MICROSCOPIC
Bilirubin Urine: NEGATIVE
Hgb urine dipstick: NEGATIVE
Ketones, ur: NEGATIVE
Leukocytes,Ua: NEGATIVE
Nitrite: NEGATIVE
Specific Gravity, Urine: 1.02 (ref 1.000–1.030)
Total Protein, Urine: NEGATIVE
Urine Glucose: >=1000 — AB
Urobilinogen, UA: 0.2 (ref 0.0–1.0)
pH: 5.5 (ref 5.0–8.0)

## 2022-04-14 LAB — CBC WITH DIFFERENTIAL/PLATELET
Basophils Absolute: 0 10*3/uL (ref 0.0–0.1)
Basophils Relative: 0.4 % (ref 0.0–3.0)
Eosinophils Absolute: 0.1 10*3/uL (ref 0.0–0.7)
Eosinophils Relative: 1.8 % (ref 0.0–5.0)
HCT: 42.7 % (ref 39.0–52.0)
Hemoglobin: 14.4 g/dL (ref 13.0–17.0)
Lymphocytes Relative: 30.7 % (ref 12.0–46.0)
Lymphs Abs: 2.1 10*3/uL (ref 0.7–4.0)
MCHC: 33.7 g/dL (ref 30.0–36.0)
MCV: 91.5 fl (ref 78.0–100.0)
Monocytes Absolute: 0.4 10*3/uL (ref 0.1–1.0)
Monocytes Relative: 5.7 % (ref 3.0–12.0)
Neutro Abs: 4.3 10*3/uL (ref 1.4–7.7)
Neutrophils Relative %: 61.4 % (ref 43.0–77.0)
Platelets: 221 10*3/uL (ref 150.0–400.0)
RBC: 4.67 Mil/uL (ref 4.22–5.81)
RDW: 14.7 % (ref 11.5–15.5)
WBC: 6.9 10*3/uL (ref 4.0–10.5)

## 2022-04-14 LAB — HM DIABETES EYE EXAM

## 2022-04-14 LAB — LIPID PANEL
Cholesterol: 113 mg/dL (ref 0–200)
HDL: 31.3 mg/dL — ABNORMAL LOW (ref >39.00)
LDL Cholesterol: 50 mg/dL (ref 0–99)
NonHDL: 82.03
Total CHOL/HDL Ratio: 4
Triglycerides: 162 mg/dL — ABNORMAL HIGH (ref 0.0–149.0)
VLDL: 32.4 mg/dL (ref 0.0–40.0)

## 2022-04-14 LAB — MICROALBUMIN / CREATININE URINE RATIO
Creatinine,U: 92.6 mg/dL
Microalb Creat Ratio: 11 mg/g (ref 0.0–30.0)
Microalb, Ur: 10.2 mg/dL — ABNORMAL HIGH (ref 0.0–1.9)

## 2022-04-14 LAB — POCT GLYCOSYLATED HEMOGLOBIN (HGB A1C): Hemoglobin A1C: 7.3 % — AB (ref 4.0–5.6)

## 2022-04-14 LAB — TSH: TSH: 2.83 u[IU]/mL (ref 0.35–5.50)

## 2022-04-14 MED ORDER — METFORMIN HCL 500 MG PO TABS: 500.0000 mg | ORAL_TABLET | Freq: Two times a day (BID) | ORAL | 3 refills | Status: DC

## 2022-04-14 MED ORDER — LOSARTAN POTASSIUM 100 MG PO TABS: 100.0000 mg | ORAL_TABLET | Freq: Every day | ORAL | 0 refills | Status: DC

## 2022-04-14 NOTE — Patient Instructions (Signed)
For blood pressure, we are increasing your lisinopril.  Please check your blood pressure at home.  Our goal blood pressure is less than 130/80.   For diabetes, we are adding metformin 500 mg twice a day.  Please continue diet and exercise as discussed.  We are referring to our chronic care management pharmacy.  They will reach out to you for assistance of medications and management of chronic disease such as diabetes and blood pressure.

## 2022-04-14 NOTE — Assessment & Plan Note (Signed)
Initiate metformin to help control blood glucose levels and potentially aid in lowering blood pressure. Continue with Jardiance and glipizide as previously prescribed. Monitor response to the new regimen with follow-up HbA1c and adjust as necessary. Encourage lifestyle modifications including a balanced diet and increased physical activity. Referral to pharmacy team for assistance with medication affordability and management.

## 2022-04-14 NOTE — Progress Notes (Signed)
Assessment/Plan:   Problem List Items Addressed This Visit       Cardiovascular and Mediastinum   Hypertension associated with diabetes ([place])    Unstable Increase losartan to manage blood pressure more effectively. Aim to maintain blood pressure readings under 130/80. Reinforce the importance of regular home blood pressure monitoring.      Relevant Medications   metFORMIN (GLUCOPHAGE) 500 MG tablet   losartan (COZAAR) 100 MG tablet   Other Relevant Orders   AMB Referral to Chronic Care Management Services   TSH   Lipid panel   Microalbumin / creatinine urine ratio   Urinalysis, Routine w reflex microscopic   CBC with Differential/Platelet   Comprehensive metabolic panel   Cryptogenic stroke (HCC)   Relevant Medications   losartan (COZAAR) 100 MG tablet   Other Relevant Orders   TSH   Lipid panel   Microalbumin / creatinine urine ratio   Urinalysis, Routine w reflex microscopic   CBC with Differential/Platelet   Comprehensive metabolic panel   Coronary artery disease involving native coronary artery of native heart without angina pectoris   Relevant Medications   losartan (COZAAR) 100 MG tablet   Other Relevant Orders   TSH   Lipid panel   Microalbumin / creatinine urine ratio   Urinalysis, Routine w reflex microscopic   CBC with Differential/Platelet   Comprehensive metabolic panel     Endocrine   Type 2 diabetes mellitus with stage 3a chronic kidney disease, without long-term current use of insulin (HCC) - Primary    Initiate metformin to help control blood glucose levels and potentially aid in lowering blood pressure. Continue with Jardiance and glipizide as previously prescribed. Monitor response to the new regimen with follow-up HbA1c and adjust as necessary. Encourage lifestyle modifications including a balanced diet and increased physical activity. Referral to pharmacy team for assistance with medication affordability and management.      Relevant  Medications   metFORMIN (GLUCOPHAGE) 500 MG tablet   losartan (COZAAR) 100 MG tablet   Other Relevant Orders   POCT glycosylated hemoglobin (Hb A1C) (Completed)   AMB Referral to Chronic Care Management Services   TSH   Lipid panel   Microalbumin / creatinine urine ratio   Urinalysis, Routine w reflex microscopic   CBC with Differential/Platelet   Comprehensive metabolic panel   Hyperlipidemia associated with type 2 diabetes mellitus (HCC)   Relevant Medications   metFORMIN (GLUCOPHAGE) 500 MG tablet   losartan (COZAAR) 100 MG tablet   Other Relevant Orders   TSH   Lipid panel   Microalbumin / creatinine urine ratio   Urinalysis, Routine w reflex microscopic   CBC with Differential/Platelet   Comprehensive metabolic panel     Other   Class 2 severe obesity with serious comorbidity in adult [facility])    Plan: Encourage the patient to pursue weight reduction through increased physical activity and dietary changes. Discuss the benefits of weight loss, including potentially decreased medication needs and improved management of his chronic conditions.      Relevant Medications   metFORMIN (GLUCOPHAGE) 500 MG tablet   Epistaxis    Plan: Provided reassurance that a singular nosebleed incident, self-resolved, is typically not of high concern. Advise to monitor for any further episodes and seek medical attention if recurrent. No immediate intervention required as the patient does not report any ongoing issues.       Medications Discontinued During This Encounter  Medication Reason   losartan (COZAAR) 50 MG tablet  Subjective:  HPI: Encounter date: 04/14/2022  Joe Stewart is a 67 y.o. male who has Hypertension associated with diabetes ([place]); Abnormal nuclear stress test; Type 2 diabetes mellitus with stage 3a chronic kidney disease, without long-term current use of insulin ([place]); Arthritis of left knee; Cecal polyp; History of colon polyps; Abnormal colonoscopy; Anemia;  Acute CVA (cerebrovascular accident) ([place]); Hyperlipidemia associated with type 2 diabetes mellitus ([place]); Cerebrovascular accident (CVA) ([place]); [place]; History of ischemic stroke; Cryptogenic stroke ([place]); Familial hypercholesterolemia; Coronary artery disease involving native coronary artery of native heart without angina pectoris; Class 2 severe obesity with serious comorbidity in adult [facility]); and Epistaxis on their problem list..   He  has a past medical history of Arthritis, Cataract, Diabetes mellitus without complication ([place]), Diabetic retinopathy ([place]), Hyperlipidemia, Hypertension, Hypertensive retinopathy, Obesity, and Stroke ([place])..   CHIEF COMPLAINT: Medical management of chronic issues - Patient presents for a 85-monthfollow-up of blood pressure and diabetes management and reports a nosebleed that occurred 3-4 weeks ago.  HISTORY OF PRESENT ILLNESS:  Hypertension: The patient presents with concerns regarding hypertension associated with diabetes. He reports that at home, his blood pressure has ranged between 130-140/52, and he has been keeping an eye on it. Today's readings show an elevation in blood pressure to 142/86. The goal for the patient, considering his history of diabetes and stroke, is to maintain blood pressure under 130/80. The patient is currently on losartan 50 mg which will be increased to help achieve target blood pressure levels.  Diabetes Mellitus: RDesmundreports a HbA1c increase from 6.9 to 7.3 since December. Previous attempts to manage his diabetes included medications such as metformin, Sitagliptin, Jardiance and glipizide.  Patient averse to injections and therefore declined GLP-1's such as Mounjaro or Ozempic.  Rybelsus was prescribed previously however it was not started because it was cost prohibitive.    Weight Concerns: The patient acknowledges the need to lose weight and expresses a willingness to try losing weight through diet and exercise now that weather  conditions have improved. The role of weight in exacerbating his current conditions was discussed, with aspirations to reduce medication dependence through weight loss.  Nosebleeds: Patient reports a single episode of nosebleed that he experienced for the first time [place] a [place], which resolved after 10 minutes. There were no subsequent episodes. The potential causes are discussed briefly with no specific conclusion drawn.  ROS:  Cardiology: The patient does not report chest pain, palpitations, or any other cardiovascular symptoms except for the noted elevated blood pressure.  Endocrine: No polyuria polydipsia: No episodes of hypoglycemia reported.  ENT: Reports of nosebleed at a casino but no recurrent episodes or other ENT issues noted.  Remainder of review of systems is negative   Past Surgical History:  Procedure Laterality Date   BIOPSY  11/29/2019   Procedure: BIOPSY;  Surgeon: MRush LandmarkGTelford Nab, MD;  [facility];  Service: Gastroenterology;;   COLONOSCOPY WITH PROPOFOL N/A 11/29/2019   Procedure: COLONOSCOPY WITH PROPOFOL;  Surgeon: MIrving Copas, MD;  [facility];  Service: Gastroenterology;  Laterality: N/A;   ENDOSCOPIC MUCOSAL RESECTION N/A 11/29/2019   Procedure: ENDOSCOPIC MUCOSAL RESECTION;  Surgeon: MRush LandmarkGTelford Nab, MD;  [facility];  Service: Gastroenterology;  Laterality: N/A;   ESOPHAGOGASTRODUODENOSCOPY (EGD) WITH PROPOFOL N/A 11/29/2019   Procedure: ESOPHAGOGASTRODUODENOSCOPY (EGD) WITH PROPOFOL;  Surgeon: MRush LandmarkGTelford Nab, MD;  [facility];  Service: Gastroenterology;  Laterality: N/A;   EYE SURGERY Left    had to have eye flushed  after getting costic sodium in L eye   HEMOSTASIS CLIP PLACEMENT  11/29/2019   Procedure: HEMOSTASIS CLIP PLACEMENT;  Surgeon: Irving Copas., MD;  [facility];  Service: Gastroenterology;;   HERNIA REPAIR     HERNIA REPAIR     35 years ago    HOT HEMOSTASIS N/A 11/29/2019   Procedure: HOT HEMOSTASIS (ARGON PLASMA COAGULATION/BICAP);  Surgeon: Irving Copas., MD;  [facility];  Service: Gastroenterology;  Laterality: N/A;   JOINT REPLACEMENT  2021   Left knee replacement   LEFT HEART CATH AND CORONARY ANGIOGRAPHY N/A 04/11/2019   Procedure: LEFT HEART CATH AND CORONARY ANGIOGRAPHY;  Surgeon: Adrian Prows, MD;  Location: [facility];  Service: Cardiovascular;  Laterality: N/A;   POLYPECTOMY  11/29/2019   Procedure: POLYPECTOMY;  Surgeon: Rush Landmark Telford Nab., MD;  [facility];  Service: Gastroenterology;;   RENAL ANGIOGRAPHY Bilateral 04/11/2019   Procedure: RENAL ANGIOGRAPHY;  Surgeon: Adrian Prows, MD;  Location: [facility];  Service: Cardiovascular;  Laterality: Bilateral;   SUBMUCOSAL LIFTING INJECTION  11/29/2019   Procedure: SUBMUCOSAL LIFTING INJECTION;  Surgeon: Irving Copas., MD;  [facility];  Service: Gastroenterology;;   TOTAL KNEE ARTHROPLASTY Left 08/01/2019   Procedure: LEFT TOTAL KNEE ARTHROPLASTY;  Surgeon: Meredith Pel, MD;  Location: [place];  Service: Orthopedics;  Laterality: Left;    Outpatient Medications Prior to Visit  Medication Sig Dispense Refill   amLODipine (NORVASC) 10 MG tablet Take 1 tablet (10 mg total) by mouth daily. 90 tablet 0   aspirin EC 81 MG tablet Take 1 tablet (81 mg total) by mouth daily. Swallow whole. 30 tablet 11   atorvastatin (LIPITOR) 80 MG tablet Take 1 tablet (80 mg total) by mouth daily. 90 tablet 1   blood glucose meter kit and supplies KIT Dispense based on patient and insurance preference. Use up to four times daily as directed. 1 each 0   clonazePAM (KLONOPIN) 0.25 MG disintegrating tablet Take 1 tablet (0.25 mg total) by mouth 2 (two) times daily. (Patient taking differently: Take 0.25 mg by mouth as needed.) 60 tablet 0   glipiZIDE (GLUCOTROL) 5 MG tablet TAKE 1 TABLET (5 MG TOTAL) BY MOUTH TWICE A DAY  BEFORE MEALS 180 tablet 1   hydrochlorothiazide (HYDRODIURIL) 25 MG tablet Take 1 tablet (25 mg total) by mouth daily. 90 tablet 3   JARDIANCE 25 MG TABS tablet TAKE 1 TABLET (25 MG TOTAL) BY MOUTH DAILY. 30 tablet 2   metoprolol succinate (TOPROL-XL) 50 MG 24 hr tablet Take 1 tablet (50 mg total) by mouth daily. Take with or immediately following a meal. 90 tablet 3   losartan (COZAAR) 50 MG tablet Take 1 tablet (50 mg total) by mouth daily at 10 pm. 90 tablet 0   No facility-administered medications prior to visit.    Family History  Problem Relation Age of Onset   Diabetes Mother    COPD Mother    Cancer Mother        unknown type    Heart disease Father    COPD Sister    Healthy Brother    Heart disease Brother    Healthy Son    Colon cancer Neg Hx    Colon polyps Neg Hx    Esophageal cancer Neg Hx    Rectal cancer Neg Hx    Stomach cancer Neg Hx    Inflammatory bowel disease Neg Hx    Liver disease Neg Hx  Pancreatic cancer Neg Hx     Social History   Socioeconomic History   Marital status: Single    Spouse name: Not on file   Number of children: 1   Years of education: Not on file   Highest education level: Not on file  Occupational History   Not on file  Tobacco Use   Smoking status: Never   Smokeless tobacco: Never  Vaping Use   Vaping Use: Never used  Substance and Sexual Activity   Alcohol use: Not Currently   Drug use: Never   Sexual activity: Not Currently  Other Topics Concern   Not on file  Social History Narrative   Not on file   Social Determinants of Health   Financial Resource Strain: Low Risk  (05/28/2021)   Overall Financial Resource Strain (CARDIA)    Difficulty of Paying Living Expenses: Not hard at all  Food Insecurity: No Food Insecurity (05/28/2021)   Hunger Vital Sign    Worried About Running Out of Food in the Last Year: Never true    Ran Out of Food in the Last Year: Never true  Transportation Needs: No Transportation Needs  (05/28/2021)   PRAPARE - Hydrologist (Medical): No    Lack of Transportation (Non-Medical): No  Physical Activity: Inactive (05/28/2021)   Exercise Vital Sign    Days of Exercise per Week: 0 days    Minutes of Exercise per Session: 0 min  Stress: No Stress Concern Present (05/28/2021)   [place]    Feeling of Stress : Only a little  Social Connections: Moderately Isolated (05/28/2021)   Social Connection and Isolation Panel [NHANES]    Frequency of Communication with Friends and Family: Three times a week    Frequency of Social Gatherings with Friends and Family: Three times a week    Attends Religious Services: More than 4 times per year    Active Member of Clubs or Organizations: No    Attends Archivist Meetings: Never    Marital Status: Divorced  Human resources officer Violence: Not At Risk (05/28/2021)   Humiliation, Afraid, Rape, and Kick questionnaire    Fear of Current or Ex-Partner: No    Emotionally Abused: No    Physically Abused: No    Sexually Abused: No                                                                                                 Objective:  Physical Exam: BP 136/82 (BP Location: Left Arm, Patient Position: Sitting, Cuff Size: Large)   Pulse 72   Temp (!) 97.4 F (36.3 C) (Temporal)   Wt 261 lb 12.8 oz (118.8 kg)   SpO2 96%   BMI 39.81 kg/m      Physical Exam Constitutional:      Appearance: Normal appearance.  HENT:     Head: Normocephalic and atraumatic.     Right Ear: Hearing normal.     Left Ear: Hearing normal.     Nose: Nose normal.  Eyes:  General: No scleral icterus.       Right eye: No discharge.        Left eye: No discharge.     Extraocular Movements: Extraocular movements intact.  Cardiovascular:     Rate and Rhythm: Normal rate and regular rhythm.     Heart sounds: Normal heart sounds.  Pulmonary:     Effort:  Pulmonary effort is normal.     Breath sounds: Normal breath sounds.  Abdominal:     Palpations: Abdomen is soft.     Tenderness: There is no abdominal tenderness.  Skin:    General: Skin is warm.     Findings: No rash.  Neurological:     General: No focal deficit present.     Mental Status: He is alert.     Cranial Nerves: No cranial nerve deficit.  Psychiatric:        Mood and Affect: Mood normal.        Behavior: Behavior normal.        Thought Content: Thought content normal.        Judgment: Judgment normal.     Results for orders placed or performed in visit on 04/14/22  POCT glycosylated hemoglobin (Hb A1C)  Result Value Ref Range   Hemoglobin A1C 7.3 (A) 4.0 - 5.6 %   HbA1c POC (<> result, manual entry)     HbA1c, POC (prediabetic range)     HbA1c, POC (controlled diabetic range)          Alesia Banda, MD, MS

## 2022-04-14 NOTE — Assessment & Plan Note (Signed)
Plan: Provided reassurance that a singular nosebleed incident, self-resolved, is typically not of high concern. Advise to monitor for any further episodes and seek medical attention if recurrent. No immediate intervention required as the patient does not report any ongoing issues.

## 2022-04-14 NOTE — Assessment & Plan Note (Signed)
Unstable Increase losartan to manage blood pressure more effectively. Aim to maintain blood pressure readings under 130/80. Reinforce the importance of regular home blood pressure monitoring.

## 2022-04-14 NOTE — Assessment & Plan Note (Signed)
Plan: Encourage the patient to pursue weight reduction through increased physical activity and dietary changes. Discuss the benefits of weight loss, including potentially decreased medication needs and improved management of his chronic conditions.

## 2022-04-20 ENCOUNTER — Ambulatory Visit (INDEPENDENT_AMBULATORY_CARE_PROVIDER_SITE_OTHER): Payer: Medicare Other

## 2022-04-20 DIAGNOSIS — I639 Cerebral infarction, unspecified: Secondary | ICD-10-CM

## 2022-04-21 LAB — CUP PACEART REMOTE DEVICE CHECK
Date Time Interrogation Session: 20240317230549
Implantable Pulse Generator Implant Date: 20230407

## 2022-04-22 ENCOUNTER — Telehealth: Payer: Self-pay

## 2022-04-22 NOTE — Progress Notes (Signed)
  Chronic Care Management   Note  04/22/2022 Name: Joe Stewart MRN: [id number] DOB: February 16, 1955  Joe Stewart is a 67 y.o. year old male who is a primary care patient of Bonnita Hollow, MD. I reached out to [name] by phone today in response to a referral sent by Joe Stewart PCP.  Joe Stewart was given information about Chronic Care Management services today including:  CCM service includes personalized support from designated clinical staff supervised by the physician, including individualized plan of care and coordination with other care providers 24/7 contact phone numbers for assistance for urgent and routine care needs. Service will only be billed when office clinical staff spend 20 minutes or more in a month to coordinate care. Only one practitioner may furnish and bill the service in a calendar month. The patient may stop CCM services at amy time (effective at the end of the month) by phone call to the office staff. The patient will be responsible for cost sharing (co-pay) or up to 20% of the service fee (after annual deductible is met)  Joe Stewart  agreedto scheduling an appointment with the CCM RN Case Manager   Follow up plan: Patient agreed to scheduled appointment with RN Case Manager on 04/23/2022 and Pharm d 04/29/2022(date/time).   Noreene Larsson, [place], [place] [address] Direct Dial: [phone] [email or web address]

## 2022-04-23 ENCOUNTER — Telehealth: Payer: Medicare Other

## 2022-04-29 ENCOUNTER — Other Ambulatory Visit: Payer: Medicare Other

## 2022-04-29 NOTE — Progress Notes (Signed)
04/29/2022 Name: Joe Stewart MRN: [id number] DOB: 1956/01/13  Chief Complaint  Patient presents with   Medication Management   Joe Stewart is a 67 y.o. year old male who presented for a telephone visit.   They were referred to the pharmacist by their PCP for assistance in managing diabetes and hypertension.   Patient is participating in a Managed Medicaid Plan: No  Subjective: Care Team: Primary Care Provider: Bonnita Hollow, MD ; Next Scheduled Visit: 10/15/22  Medication Access/Adherence Current Pharmacy:  [id number] - [place], [place] [address] Phone: [phone] Fax: [phone]  Patient reports affordability concerns with their medications: No  Patient reports access/transportation concerns to their pharmacy: No  Patient reports adherence concerns with their medications:  No    Diabetes: Current medications: metformin 500mg  BID, Jardiance 25mg  daily, glipizide 5mg  BID -Medications tried in the past: Rybelsus -Rybelsus was not affordable for the patient, and he is averse to injections -Current glucose readings: FBG averages 110-120, with highest 147 and lowest 96 -Using [facility] brand meter; testing once daily -Uses quarterly OTC benefit through insurance to purchase strips and lancets -Verified that [name] is covered by insurance and copay affordable for him -Patient reports hypoglycemic s/sx including dizziness, shakiness, sweating only if he has waited too long to eat something.  Aware to take glipizide immediately before eating and knows how to address s/sx of hypo in the rare event they occur -Patient denies hyperglycemic symptoms including polyuria, polydipsia, polyphagia, nocturia, neuropathy, blurred vision.  Hypertension: Current medications: hctz 25mg  daily, amlodipine 10mg  daily, losartan 100mg  daily, metoprolol xl 50mg  daily  -Patient has a validated, automated, upper arm  home BP cuff -Current blood pressure readings readings: 126/67 -Patient reports hypotensive s/sx including dizziness, lightheadedness. These occur very rarely and are upon sitting/standing too quickly. -Patient denies hypertensive symptoms including headache, chest pain, shortness of breath  Hyperlipidemia -Current medications: atorvastatin 80mg  daily and ASA 81mg  daily  Objective: Lab Results  Component Value Date   HGBA1C 7.3 (A) 04/14/2022   Lab Results  Component Value Date   CREATININE 1.17 04/14/2022   BUN 24 (H) 04/14/2022   NA 139 04/14/2022   K 4.5 04/14/2022   CL 103 04/14/2022   CO2 26 04/14/2022   Lab Results  Component Value Date   CHOL 113 04/14/2022   HDL 31.30 (L) 04/14/2022   LDLCALC 50 04/14/2022   LDLDIRECT 55.0 04/15/2021   TRIG 162.0 (H) 04/14/2022   CHOLHDL 4 04/14/2022   Medications Reviewed Today     Reviewed by Darlina Guys, RPH (Pharmacist) on 04/29/22 at 83  Med List Status: <None>   Medication Order Taking? Sig Documenting Provider Last Dose Status Informant  amLODipine (NORVASC) 10 MG tablet [id number] Yes Take 1 tablet (10 mg total) by mouth daily. Bonnita Hollow, MD Taking Active   aspirin EC 81 MG tablet [id number] Yes Take 1 tablet (81 mg total) by mouth daily. Swallow whole. Bonnielee Haff, MD Taking Active   atorvastatin (LIPITOR) 80 MG tablet [id number] Yes Take 1 tablet (80 mg total) by mouth daily. Bonnita Hollow, MD Taking Active   blood glucose meter kit and supplies KIT [id number] Yes Dispense based on patient and insurance preference. Use up to four times daily as directed. Bonnielee Haff, MD Taking Active            Med Note Colin Rhein, [place] A   Wed Apr 29, 2022 11:10  AM) [facility] meter/strips/lancets   Patient taking differently:   Discontinued 04/29/22 1118 (Patient Preference)   glipiZIDE (GLUCOTROL) 5 MG tablet [id number] Yes TAKE 1 TABLET (5 MG TOTAL) BY MOUTH TWICE A DAY BEFORE MEALS Bonnita Hollow, MD Taking Active    hydrochlorothiazide (HYDRODIURIL) 25 MG tablet [id number] Yes Take 1 tablet (25 mg total) by mouth daily. Werner Lean, MD Taking Active   JARDIANCE 25 MG TABS tablet [id number] Yes TAKE 1 TABLET (25 MG TOTAL) BY MOUTH DAILY. Bonnita Hollow, MD Taking Active   losartan (COZAAR) 100 MG tablet [id number] Yes Take 1 tablet (100 mg total) by mouth daily. Bonnita Hollow, MD Taking Active   metFORMIN (GLUCOPHAGE) 500 MG tablet [id number] Yes Take 1 tablet (500 mg total) by mouth 2 (two) times daily with a meal. Bonnita Hollow, MD Taking Active   metoprolol succinate (TOPROL-XL) 50 MG 24 hr tablet [id number] Yes Take 1 tablet (50 mg total) by mouth daily. Take with or immediately following a meal. [name], NP Taking Active            Assessment/Plan:   Diabetes: - Moderately controlled - Reviewed goal A1c, goal fasting, and goal 2 hour post prandial glucose - Recommend to check glucose daily and record - Discussed notifying me if coverage/affordability of Jardiance were to change - Recommend continuing current regimen at this time and checking A1c and CMP at f/u in September - Patient reports tolerating metformin well, so can increase to 1000mg  BID if needed  Hypertension: - Currently controlled - Recommend to continue current regimen and daily monitoring/recording of BP   Hyperlipidemia/ASCVD Risk Reduction: - Currently controlled.  - Continue current regimen  Follow Up Plan: None needed at this time  Darlina Guys, PharmD, DPLA

## 2022-04-29 NOTE — Progress Notes (Signed)
[facility] Note  05/01/2022    CHIEF COMPLAINT Patient presents for Retina Follow Up   HISTORY OF PRESENT ILLNESS: Joe Stewart is a 67 y.o. male who presents to the clinic today for:  HPI     Retina Follow Up   Patient presents with  Diabetic Retinopathy.  In both eyes.  Severity is moderate.  Duration of 6 weeks.  Since onset it is stable.  I, the attending physician,  performed the HPI with the patient and updated documentation appropriately.        Comments   6 week Retina eval for NPDR. Patient states os still is blurry at times, Blood sugar 123      Last edited by Bernarda Caffey, MD on 05/01/2022 12:36 PM.    Pt states left eye feels "fuzzy", he states his blood sugar and blood pressure on under control   Referring physician: Shirleen Schirmer, [place] [address], [place] [address]  HISTORICAL INFORMATION:   Selected notes from the [place] Referred by [name], PA LEE: 09.15.21 BCVA OD: 20/20 OS: 20/20 Ocular Hx- NPDR with edema  PMH- DM, HTN   CURRENT MEDICATIONS: No current outpatient medications on file. (Ophthalmic Drugs)   No current facility-administered medications for this visit. (Ophthalmic Drugs)   Current Outpatient Medications (Other)  Medication Sig   amLODipine (NORVASC) 10 MG tablet Take 1 tablet (10 mg total) by mouth daily.   aspirin EC 81 MG tablet Take 1 tablet (81 mg total) by mouth daily. Swallow whole.   atorvastatin (LIPITOR) 80 MG tablet Take 1 tablet (80 mg total) by mouth daily.   blood glucose meter kit and supplies KIT Dispense based on patient and insurance preference. Use up to four times daily as directed.   glipiZIDE (GLUCOTROL) 5 MG tablet TAKE 1 TABLET (5 MG TOTAL) BY MOUTH TWICE A DAY BEFORE MEALS   hydrochlorothiazide (HYDRODIURIL) 25 MG tablet Take 1 tablet (25 mg total) by mouth daily.   JARDIANCE 25 MG TABS tablet TAKE 1 TABLET (25 MG TOTAL) BY MOUTH DAILY.    losartan (COZAAR) 100 MG tablet Take 1 tablet (100 mg total) by mouth daily.   metFORMIN (GLUCOPHAGE) 500 MG tablet Take 1 tablet (500 mg total) by mouth 2 (two) times daily with a meal.   metoprolol succinate (TOPROL-XL) 50 MG 24 hr tablet Take 1 tablet (50 mg total) by mouth daily. Take with or immediately following a meal.   No current facility-administered medications for this visit. (Other)   REVIEW OF SYSTEMS: ROS   Positive for: Gastrointestinal, Neurological, Musculoskeletal, Endocrine, Eyes Negative for: Constitutional, Skin, Genitourinary, HENT, Cardiovascular, Respiratory, Psychiatric, Allergic/Imm, Heme/Lymph Last edited by Elmore Guise, COT on 05/01/2022  7:49 AM.      ALLERGIES No Known Allergies  PAST MEDICAL HISTORY Past Medical History:  Diagnosis Date   Arthritis    Cataract    Mixed OU   Diabetes mellitus without complication ([place])    Diabetic retinopathy ([place])    NPDR OU   Hyperlipidemia    Hypertension    Hypertensive retinopathy    OU   Obesity    Stroke [facility])    Past Surgical History:  Procedure Laterality Date   BIOPSY  11/29/2019   Procedure: BIOPSY;  Surgeon: Irving Copas., MD;  Location: [facility];  Service: Gastroenterology;;   COLONOSCOPY WITH PROPOFOL N/A 11/29/2019   Procedure: COLONOSCOPY WITH PROPOFOL;  Surgeon: Irving Copas., MD;  Location: [facility];  Service: Gastroenterology;  Laterality: N/A;   ENDOSCOPIC MUCOSAL RESECTION N/A 11/29/2019   Procedure: ENDOSCOPIC MUCOSAL RESECTION;  Surgeon: Rush Landmark Telford Nab., MD;  Location: [facility];  Service: Gastroenterology;  Laterality: N/A;   ESOPHAGOGASTRODUODENOSCOPY (EGD) WITH PROPOFOL N/A 11/29/2019   Procedure: ESOPHAGOGASTRODUODENOSCOPY (EGD) WITH PROPOFOL;  Surgeon: Rush Landmark Telford Nab., MD;  Location: [facility];  Service: Gastroenterology;  Laterality: N/A;   EYE SURGERY Left    had to have eye flushed after getting costic sodium in L eye    HEMOSTASIS CLIP PLACEMENT  11/29/2019   Procedure: HEMOSTASIS CLIP PLACEMENT;  Surgeon: Irving Copas., MD;  Location: [facility];  Service: Gastroenterology;;   HERNIA REPAIR     HERNIA REPAIR     35 years ago   HOT HEMOSTASIS N/A 11/29/2019   Procedure: HOT HEMOSTASIS (ARGON PLASMA COAGULATION/BICAP);  Surgeon: Irving Copas., MD;  Location: [facility];  Service: Gastroenterology;  Laterality: N/A;   JOINT REPLACEMENT  2021   Left knee replacement   LEFT HEART CATH AND CORONARY ANGIOGRAPHY N/A 04/11/2019   Procedure: LEFT HEART CATH AND CORONARY ANGIOGRAPHY;  Surgeon: Adrian Prows, MD;  Location: [facility];  Service: Cardiovascular;  Laterality: N/A;   POLYPECTOMY  11/29/2019   Procedure: POLYPECTOMY;  Surgeon: Rush Landmark Telford Nab., MD;  Location: [facility];  Service: Gastroenterology;;   RENAL ANGIOGRAPHY Bilateral 04/11/2019   Procedure: RENAL ANGIOGRAPHY;  Surgeon: Adrian Prows, MD;  Location: [facility];  Service: Cardiovascular;  Laterality: Bilateral;   SUBMUCOSAL LIFTING INJECTION  11/29/2019   Procedure: SUBMUCOSAL LIFTING INJECTION;  Surgeon: Irving Copas., MD;  Location: [facility];  Service: Gastroenterology;;   TOTAL KNEE ARTHROPLASTY Left 08/01/2019   Procedure: LEFT TOTAL KNEE ARTHROPLASTY;  Surgeon: Meredith Pel, MD;  Location: [facility];  Service: Orthopedics;  Laterality: Left;   FAMILY HISTORY Family History  Problem Relation Age of Onset   Diabetes Mother    COPD Mother    Cancer Mother        unknown type    Heart disease Father    COPD Sister    Healthy Brother    Heart disease Brother    Healthy Son    Colon cancer Neg Hx    Colon polyps Neg Hx    Esophageal cancer Neg Hx    Rectal cancer Neg Hx    Stomach cancer Neg Hx    Inflammatory bowel disease Neg Hx    Liver disease Neg Hx    Pancreatic cancer Neg Hx    SOCIAL HISTORY Social History   Tobacco Use   Smoking status: Never   Smokeless  tobacco: Never  Vaping Use   Vaping Use: Never used  Substance Use Topics   Alcohol use: Not Currently   Drug use: Never       OPHTHALMIC EXAM:  Base Eye Exam     Visual Acuity (Snellen - Linear)       Right Left   Dist [place] 20/25-2 20/30   Dist ph [place] 20/NI          Tonometry (Tonopen, 7:50 AM)       Right Left   Pressure 18 17         Pupils       Dark Light Shape React APD   Right 3 2 Round Brisk None   Left 3 2 Round Brisk None         Visual Fields  Left Right   Restrictions Total superior nasal deficiency Total superior temporal deficiency         Extraocular Movement       Right Left    Full, Ortho Full, Ortho         Neuro/Psych     Oriented x3: Yes   Mood/Affect: Normal         Dilation     Both eyes: 1.0% Mydriacyl, 2.5% Phenylephrine @ 7:49 AM           Slit Lamp and Fundus Exam     Slit Lamp Exam       Right Left   Lids/Lashes Dermatochalasis - upper lid, Meibomian gland dysfunction Dermatochalasis - upper lid, Meibomian gland dysfunction   Conjunctiva/Sclera White and quiet White and quiet   Cornea 1+Punctate epithelial erosions, well healed cataract wound 1+ Punctate epithelial erosions, well healed cataract wound   Anterior Chamber Deep and quiet, deep, clear, narrow temporal angle Deep and quiet   Iris Round and moderately dilated, No NVI Round and moderately dilated to 6.51mm, No NVI, PPM nasal   Lens PC IOL in good position, trace Posterior capsular opacification PC IOL in good position, trace Posterior capsular opacification   Anterior Vitreous Vitreous syneresis, mild vitreous condensations Vitreous syneresis         Fundus Exam       Right Left   Disc mild Pallor, Sharp rim, mild PPA Pink and Sharp, +Peripapillary atrophy, Compact   C/D Ratio 0.2 0.1   Macula Flat, Blunted foveal reflex, scattered MA/IRH, RPE mottling and clumping, trace cystic changes centrally -- improved Flat, Blunted foveal reflex,  rare MA, focal cluster of cystic changes superior macula -- slightly increased, no exudates   Vessels attenuated, Tortuous attenuated, Tortuous   Periphery Attached, pigmented cystoid degeneration ST periphery, scattered MA/IRH greatest posteriorly, focal pigmented VR tuft at 0800 Attached, scattered MA greatest posteriorly, focal pigmented CR scarring along distal ST arcades (0130)            IMAGING AND PROCEDURES  Imaging and Procedures for 05/01/2022  OCT, Retina - OU - Both Eyes       Right Eye Quality was good. Central Foveal Thickness: 348. Progression has improved. Findings include normal foveal contour, no IRF, no SRF, intraretinal hyper-reflective material (Scattered cystic changes -- slightly improved, very shallow schisis ST periphery caught on widefield--not imaged today).   Left Eye Quality was good. Central Foveal Thickness: 348. Progression has worsened. Findings include no SRF, abnormal foveal contour, intraretinal hyper-reflective material, intraretinal fluid, vitreomacular adhesion (Mild interval increase in IRF/central edema greatest superior macula and fovea).   Notes *Images captured and stored on drive  Diagnosis / Impression:  DME OU OD: Scattered cystic changes -- slightly improved, very shallow schisis ST periphery caught on widefield--not imaged today OS: Mild interval increase in IRF/central edema greatest superior macula and fovea  Clinical management:  See below  Abbreviations: NFP - Normal foveal profile. CME - cystoid macular edema. PED - pigment epithelial detachment. IRF - intraretinal fluid. SRF - subretinal fluid. EZ - ellipsoid zone. ERM - epiretinal membrane. ORA - outer retinal atrophy. ORT - outer retinal tubulation. SRHM - subretinal hyper-reflective material. IRHM - intraretinal hyper-reflective material      Intravitreal Injection, Pharmacologic Agent - OS - Left Eye       Time Out 05/01/2022. 8:13 AM. Confirmed correct patient,  procedure, site, and patient consented.   Anesthesia Topical anesthesia was used. Anesthetic medications included Lidocaine 2%,  Proparacaine 0.5%.   Procedure Preparation included 5% betadine to ocular surface, eyelid speculum. A (32g) needle was used.   Injection: 1.25 mg Bevacizumab 1.25mg /0.43ml   Route: Intravitreal, Site: Left Eye   NDC: H061816, Lot: [id number], Expiration date: 08/02/2022   Post-op Post injection exam found visual acuity of at least counting fingers. The patient tolerated the procedure well. There were no complications. The patient received written and verbal post procedure care education.             ASSESSMENT/PLAN:    ICD-10-CM   1. Acute CVA (cerebrovascular accident) ([place])  I63.9     2. Visual field loss following cerebrovascular accident  I69.398    H54.7     3. 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 Essential hypertension  I10     5. Hypertensive retinopathy of both eyes  H35.033     6. Pseudophakia, both eyes  Z96.1      1,2. 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  - BCVA was improved to 20/25 OU post cataract sx, today -- 20/25 OD, 20/30 OS  - baseline visual field obtained, 04.24.23 -- right homonymous superior quadrantanopia consistent w/ L PCA stroke  - monitor  3. Moderate Non-proliferative diabetic retinopathy w/ DME OU  - pt lost to f/u from Mar 2022 to Feb 2023 (11 mos)  - S/P IVA OD #1 (10.26.21), #2 (11.24.21), #3 (12.22.21), #4 (03/06/20), #5  (03.23.22)  - S/P IVA OS #1 (03.23.22), #2 (01.05.24), #3 (02.16.24)             - A1c 6.9% 12.12.23; Max 12.3% on 01.19.23 - BCVA 20/25 OD, 20/30 OS -- both improved - exam shows scattered MA and mild edema/cystic changes OU -- improving - FA (09.29.21) shows late leaking MA OU; no NV OU  - OCT shows OD: Scattered cystic changes -- slightly improved; OS: Mild interval increase in IRF/central edema greatet superior macula and fovea at 6 weeks  - recommend IVA OS #4 today, 03.29.24 with follow up back to 5 weeks  - pt wishes to proceed with injection  - RBA of procedure discussed, questions answered - IVA informed consent obtained and signed, 01.05.24 (OS) - see procedure note  - f/u 5 weeks, DFE, OCT  4,5. Hypertensive retinopathy OU - discussed importance of tight BP control - monitor  6. Pseudophakia OU  - s/p CE/IOL (Dr. Zenia Resides, ~August 2023)  - IOL in good position, doing well  - monitor   Ophthalmic Meds Ordered this visit:  Meds ordered this encounter  Medications   Bevacizumab (AVASTIN) SOLN 1.25 mg    Return in about 5 weeks (around 06/05/2022) for f/u NPDR OU, DFE, OCT.  There are no Patient Instructions on file for this visit.  This document serves as a record of services personally performed by Gardiner Sleeper, MD, PhD. It was created on their behalf by San Jetty. Owens Shark, OA an ophthalmic technician. The creation of this record is the provider's dictation and/or activities during the visit.  Electronically signed by: San Jetty. Owens Shark, [place] 03.27.2024 1:56 AM   Gardiner Sleeper, M.D., Ph.D. Diseases & Surgery of the Retina and Vitreous [facility]  I have reviewed the above documentation for accuracy and completeness, and I agree with the above. Gardiner Sleeper, M.D., Ph.D. 05/02/22 2:02 AM   Abbreviations: M myopia (nearsighted); A astigmatism; H hyperopia (farsighted); P presbyopia; Mrx spectacle prescription;  CTL contact lenses; OD right  eye; OS left eye; OU both eyes  XT exotropia; ET esotropia; PEK punctate epithelial keratitis; PEE punctate epithelial erosions; DES dry eye syndrome; MGD meibomian gland dysfunction; ATs artificial tears; PFAT's preservative free artificial tears; [place] nuclear sclerotic cataract; PSC posterior subcapsular cataract; ERM epi-retinal membrane; PVD posterior vitreous detachment; RD retinal detachment; DM diabetes mellitus; DR diabetic retinopathy; NPDR non-proliferative diabetic retinopathy; PDR proliferative diabetic retinopathy; CSME clinically significant macular edema; DME diabetic macular edema; dbh dot blot hemorrhages; CWS cotton wool spot; POAG primary open angle glaucoma; C/D cup-to-disc ratio; HVF humphrey visual field; GVF goldmann visual field; OCT optical coherence tomography; IOP intraocular pressure; BRVO Branch retinal vein occlusion; CRVO central retinal vein occlusion; CRAO central retinal artery occlusion; BRAO branch retinal artery occlusion; RT retinal tear; SB scleral buckle; PPV pars plana vitrectomy; VH Vitreous hemorrhage; PRP panretinal laser photocoagulation; IVK intravitreal kenalog; VMT vitreomacular traction; MH Macular hole;  NVD neovascularization of the disc; NVE neovascularization elsewhere; AREDS age related eye disease study; ARMD age related macular degeneration; POAG primary open angle glaucoma; EBMD epithelial/anterior basement membrane dystrophy; ACIOL anterior chamber intraocular lens; IOL intraocular lens; PCIOL posterior chamber intraocular lens; Phaco/IOL phacoemulsification with intraocular lens placement; [place] photorefractive keratectomy; LASIK laser assisted in situ keratomileusis; HTN hypertension; DM diabetes mellitus; COPD chronic obstructive pulmonary disease

## 2022-04-30 NOTE — Progress Notes (Signed)
[facility] Summary Report / Loop Recorder 

## 2022-05-01 ENCOUNTER — Encounter (INDEPENDENT_AMBULATORY_CARE_PROVIDER_SITE_OTHER): Payer: Self-pay | Admitting: Ophthalmology

## 2022-05-01 ENCOUNTER — Ambulatory Visit (INDEPENDENT_AMBULATORY_CARE_PROVIDER_SITE_OTHER): Payer: Medicare Other | Admitting: Ophthalmology

## 2022-05-01 DIAGNOSIS — H35033 Hypertensive retinopathy, bilateral: Secondary | ICD-10-CM

## 2022-05-01 DIAGNOSIS — H547 Unspecified visual loss: Secondary | ICD-10-CM

## 2022-05-01 DIAGNOSIS — I10 Essential (primary) hypertension: Secondary | ICD-10-CM | POA: Diagnosis not present

## 2022-05-01 DIAGNOSIS — Z961 Presence of intraocular lens: Secondary | ICD-10-CM | POA: Diagnosis not present

## 2022-05-01 DIAGNOSIS — E113313 Type 2 diabetes mellitus with moderate nonproliferative diabetic retinopathy with macular edema, bilateral: Secondary | ICD-10-CM | POA: Diagnosis not present

## 2022-05-01 DIAGNOSIS — I639 Cerebral infarction, unspecified: Secondary | ICD-10-CM

## 2022-05-01 MED ORDER — BEVACIZUMAB CHEMO INJECTION 1.25MG/0.05ML SYRINGE FOR KALEIDOSCOPE
1.2500 mg | INTRAVITREAL | Status: AC | PRN
  Administered 2022-05-01: 1.25 mg via INTRAVITREAL

## 2022-05-08 ENCOUNTER — Telehealth: Payer: Medicare Other

## 2022-05-09 ENCOUNTER — Other Ambulatory Visit: Payer: Self-pay | Admitting: Family Medicine

## 2022-05-09 DIAGNOSIS — I10 Essential (primary) hypertension: Secondary | ICD-10-CM

## 2022-05-10 ENCOUNTER — Other Ambulatory Visit: Payer: Self-pay | Admitting: Family

## 2022-05-10 ENCOUNTER — Other Ambulatory Visit: Payer: Self-pay | Admitting: Family Medicine

## 2022-05-10 DIAGNOSIS — I639 Cerebral infarction, unspecified: Secondary | ICD-10-CM

## 2022-05-10 DIAGNOSIS — I10 Essential (primary) hypertension: Secondary | ICD-10-CM

## 2022-05-11 ENCOUNTER — Telehealth: Payer: Self-pay

## 2022-05-11 NOTE — Telephone Encounter (Signed)
Patient returned call. Advised AF was noted on his loop recorder and recommends AF clinic referral to discuss OAC therapies. Patient agreeable to plan.

## 2022-05-11 NOTE — Telephone Encounter (Signed)
Following alert received from [employer] received for ILR alert for AF ongoing from 4/6 @ 23:45, overall controlled rates No OAC, ASA only per EPIC.  Attempted to contact pt. No answer, LMTCB.

## 2022-05-12 ENCOUNTER — Ambulatory Visit (HOSPITAL_COMMUNITY)
Admission: RE | Admit: 2022-05-12 | Discharge: 2022-05-12 | Disposition: A | Discharge status: Home / Self Care | Payer: Medicare Other | Source: Ambulatory Visit | Attending: Internal Medicine | Admitting: Internal Medicine

## 2022-05-12 VITALS — BP 172/84 | HR 75 | Ht 68.0 in | Wt 264.6 lb

## 2022-05-12 DIAGNOSIS — I480 Paroxysmal atrial fibrillation: Secondary | ICD-10-CM | POA: Insufficient documentation

## 2022-05-12 DIAGNOSIS — D6869 Other thrombophilia: Secondary | ICD-10-CM | POA: Diagnosis not present

## 2022-05-12 MED ORDER — APIXABAN 5 MG PO TABS: 5.0000 mg | ORAL_TABLET | Freq: Two times a day (BID) | ORAL | 3 refills | Status: DC

## 2022-05-12 NOTE — Patient Instructions (Signed)
Stop aspirin  Start Eliquis 5mg twice a day 

## 2022-05-12 NOTE — Progress Notes (Signed)
Primary Care Physician: Joe Gunner, MD Primary Cardiologist: Dr. Izora Ribas Primary Electrophysiologist: Dr. Ladona Ridgel Referring Physician:    Humzah Stewart is a 66 y.o. male with a history of moderate nonobstructive CAD, CKD stage IIIa, DM2, HTN, familial triglyceridemia, prior stroke s/p ILR placement, and paroxysmal atrial fibrillation who presents for consultation in the [facility]. He was evaluated by Dr. Ladona Ridgel on 05/09/21 for cryptogenic stroke and had ILR placed. The patient was recently diagnosed with atrial fibrillation due to ILR alert demonstrating ~6 hr episode of Afib on 05/09/22 with max HR 130. Patient has a CHADS2VASC score of 6.  On evaluation today, he is in SR today. ILR alert for new episode of Afib was for ~6 hours in duration and began at 2345 that evening. He states he did not feel that episode and was most likely asleep. He had won $1,300 earlier that evening playing blackjack in casino but did not feel any symptoms at that time. He is currently on ASA 81 mg daily and had a nosebleed 3 weeks ago for the first time which was odd to him. No more epistaxis since then. He does not drink alcohol. His brother and father were diagnosed with Afib; father is deceased. Some snoring with sleep but seems to have improved now that he is sleeping on his side.   Today, he denies symptoms of palpitations, chest pain, shortness of breath, orthopnea, PND, lower extremity edema, dizziness, presyncope, syncope, snoring, daytime somnolence, bleeding, or neurologic sequela. The patient is tolerating medications without difficulties and is otherwise without complaint today.   Atrial Fibrillation Risk Factors:  he does not have symptoms or diagnosis of sleep apnea. he does not have a history of rheumatic fever. he does not have a history of alcohol use. The patient does have a history of early familial atrial fibrillation or other arrhythmias.  he has a BMI of  Body mass index is 40.23 kg/m.Marland Kitchen Filed Weights   05/12/22 1026  Weight: 120 kg    Family History  Problem Relation Age of Onset   Diabetes Mother    COPD Mother    Cancer Mother        unknown type    Heart disease Father    COPD Sister    Healthy Brother    Heart disease Brother    Healthy Son    Colon cancer Neg Hx    Colon polyps Neg Hx    Esophageal cancer Neg Hx    Rectal cancer Neg Hx    Stomach cancer Neg Hx    Inflammatory bowel disease Neg Hx    Liver disease Neg Hx    Pancreatic cancer Neg Hx      Atrial Fibrillation Management history:  Previous antiarrhythmic drugs: None Previous cardioversions: None Previous ablations: None Anticoagulation history: None   Past Medical History:  Diagnosis Date   Arthritis    Cataract    Mixed OU   Diabetes mellitus without complication (HCC)    Diabetic retinopathy (HCC)    NPDR OU   Hyperlipidemia    Hypertension    Hypertensive retinopathy    OU   Obesity    Stroke [facility])    Past Surgical History:  Procedure Laterality Date   BIOPSY  11/29/2019   Procedure: BIOPSY;  Surgeon: Lemar Lofty., MD;  Location: [facility];  Service: Gastroenterology;;   COLONOSCOPY WITH PROPOFOL N/A 11/29/2019   Procedure: COLONOSCOPY WITH PROPOFOL;  Surgeon: Lemar Lofty., MD;  Location: [facility];  Service: Gastroenterology;  Laterality: N/A;   ENDOSCOPIC MUCOSAL RESECTION N/A 11/29/2019   Procedure: ENDOSCOPIC MUCOSAL RESECTION;  Surgeon: Meridee Score Netty Starring., MD;  Location: [facility];  Service: Gastroenterology;  Laterality: N/A;   ESOPHAGOGASTRODUODENOSCOPY (EGD) WITH PROPOFOL N/A 11/29/2019   Procedure: ESOPHAGOGASTRODUODENOSCOPY (EGD) WITH PROPOFOL;  Surgeon: Meridee Score Netty Starring., MD;  Location: [facility];  Service: Gastroenterology;  Laterality: N/A;   EYE SURGERY Left    had to have eye flushed after getting costic sodium in L eye   HEMOSTASIS CLIP PLACEMENT  11/29/2019   Procedure:  HEMOSTASIS CLIP PLACEMENT;  Surgeon: Lemar Lofty., MD;  Location: [facility];  Service: Gastroenterology;;   HERNIA REPAIR     HERNIA REPAIR     35 years ago   HOT HEMOSTASIS N/A 11/29/2019   Procedure: HOT HEMOSTASIS (ARGON PLASMA COAGULATION/BICAP);  Surgeon: Lemar Lofty., MD;  Location: [facility];  Service: Gastroenterology;  Laterality: N/A;   JOINT REPLACEMENT  2021   Left knee replacement   LEFT HEART CATH AND CORONARY ANGIOGRAPHY N/A 04/11/2019   Procedure: LEFT HEART CATH AND CORONARY ANGIOGRAPHY;  Surgeon: Yates Decamp, MD;  Location: [facility]VAS[facility] CV LAB;  Service: Cardiovascular;  Laterality: N/A;   POLYPECTOMY  11/29/2019   Procedure: POLYPECTOMY;  Surgeon: Meridee Score Netty Starring., MD;  Location: [facility];  Service: Gastroenterology;;   RENAL ANGIOGRAPHY Bilateral 04/11/2019   Procedure: RENAL ANGIOGRAPHY;  Surgeon: Yates Decamp, MD;  Location: [facility] INVASIVE CV LAB;  Service: Cardiovascular;  Laterality: Bilateral;   SUBMUCOSAL LIFTING INJECTION  11/29/2019   Procedure: SUBMUCOSAL LIFTING INJECTION;  Surgeon: Lemar Lofty., MD;  Location: [facility];  Service: Gastroenterology;;   TOTAL KNEE ARTHROPLASTY Left 08/01/2019   Procedure: LEFT TOTAL KNEE ARTHROPLASTY;  Surgeon: Cammy Copa, MD;  Location: [facility] OR;  Service: Orthopedics;  Laterality: Left;    Current Outpatient Medications  Medication Sig Dispense Refill   amLODipine (NORVASC) 10 MG tablet TAKE 1 TABLET BY MOUTH EVERY DAY 90 tablet 0   apixaban (ELIQUIS) 5 MG TABS tablet Take 1 tablet (5 mg total) by mouth 2 (two) times daily. 60 tablet 3   atorvastatin (LIPITOR) 80 MG tablet Take 1 tablet (80 mg total) by mouth daily. 90 tablet 1   blood glucose meter kit and supplies KIT Dispense based on patient and insurance preference. Use up to four times daily as directed. 1 each 0   glipiZIDE (GLUCOTROL) 5 MG tablet TAKE 1 TABLET (5 MG TOTAL) BY MOUTH TWICE A DAY BEFORE MEALS 180 tablet  1   hydrochlorothiazide (HYDRODIURIL) 25 MG tablet Take 1 tablet (25 mg total) by mouth daily. 90 tablet 3   JARDIANCE 25 MG TABS tablet TAKE 1 TABLET (25 MG TOTAL) BY MOUTH DAILY. 30 tablet 2   losartan (COZAAR) 100 MG tablet Take 1 tablet (100 mg total) by mouth daily. 90 tablet 0   metFORMIN (GLUCOPHAGE) 500 MG tablet Take 1 tablet (500 mg total) by mouth 2 (two) times daily with a meal. 180 tablet 3   metoprolol succinate (TOPROL-XL) 50 MG 24 hr tablet Take 1 tablet (50 mg total) by mouth daily. Take with or immediately following a meal. 90 tablet 3   No current facility-administered medications for this encounter.    No Known Allergies  Social History   Socioeconomic History   Marital status: Single    Spouse name: Not on file   Number of children: 1   Years of education: Not on file   Highest  education level: Not on file  Occupational History   Not on file  Tobacco Use   Smoking status: Never   Smokeless tobacco: Never  Vaping Use   Vaping Use: Never used  Substance and Sexual Activity   Alcohol use: Not Currently   Drug use: Never   Sexual activity: Not Currently  Other Topics Concern   Not on file  Social History Narrative   Not on file   Social Determinants of Health   Financial Resource Strain: Low Risk  (05/28/2021)   Overall Financial Resource Strain (CARDIA)    Difficulty of Paying Living Expenses: Not hard at all  Food Insecurity: No Food Insecurity (05/28/2021)   Hunger Vital Sign    Worried About Running Out of Food in the Last Year: Never true    Ran Out of Food in the Last Year: Never true  Transportation Needs: No Transportation Needs (05/28/2021)   PRAPARE - Administrator, Civil Service (Medical): No    Lack of Transportation (Non-Medical): No  Physical Activity: Inactive (05/28/2021)   Exercise Vital Sign    Days of Exercise per Week: 0 days    Minutes of Exercise per Session: 0 min  Stress: No Stress Concern Present (05/28/2021)    [employer] of Occupational Health - Occupational Stress Questionnaire    Feeling of Stress : Only a little  Social Connections: Moderately Isolated (05/28/2021)   Social Connection and Isolation Panel [NHANES]    Frequency of Communication with Friends and Family: Three times a week    Frequency of Social Gatherings with Friends and Family: Three times a week    Attends Religious Services: More than 4 times per year    Active Member of Clubs or Organizations: No    Attends Banker Meetings: Never    Marital Status: Divorced  Catering manager Violence: Not At Risk (05/28/2021)   Humiliation, Afraid, Rape, and Kick questionnaire    Fear of Current or Ex-Partner: No    Emotionally Abused: No    Physically Abused: No    Sexually Abused: No     ROS- All systems are reviewed and negative except as per the HPI above.  Physical Exam: Vitals:   05/12/22 1026  BP: (!) 172/84  Pulse: 75  Weight: 120 kg  Height: 5\' 8"  (1.727 m)    GEN- The patient is a well appearing male, alert and oriented x 3 today.   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 Moderate concentric LVH with proximal septal thickening.   2. Left ventricular ejection fraction, by estimation, is 70 to 75%. The  left ventricle has hyperdynamic function.  The left ventricle has no  regional wall motion abnormalities. There is moderate concentric left  ventricular hypertrophy. Left ventricular  diastolic parameters are consistent with Grade I diastolic dysfunction  (  impaired relaxation). Elevated left atrial pressure.   3. Right ventricular systolic function is normal. The right ventricular  size is normal.   4. The mitral valve is normal in structure. No evidence of mitral valve  regurgitation. No evidence of mitral stenosis.   5. The aortic valve is tricuspid. Aortic valve regurgitation is not  visualized. Aortic valve sclerosis/calcification is present, without any  evidence of aortic stenosis.   6. The inferior vena cava is normal in size with greater than 50%  respiratory variability, suggesting right atrial pressure of 3 mmHg.    Epic records are reviewed at length today.  CHA2DS2-VASc Score = 6  The patient's score is based upon: CHF History: 0 HTN History: 1 Diabetes History: 1 Stroke History: 2 Vascular Disease History: 1 Age Score: 1 Gender Score: 0       ASSESSMENT AND PLAN: Paroxysmal Atrial Fibrillation (ICD10:  I48.0) The patient's CHA2DS2-VASc score is 6, indicating a 9.7% annual risk of stroke.    Education provided about Afib with visual diagram. Discussion about medication treatments and ablation in detail. After discussion, we will proceed with conservative observation at this time.  Continue Toprol 50 mg once daily.  2. Secondary Hypercoagulable State (ICD10:  D68.69) The patient is at significant risk for stroke/thromboembolism based upon his CHA2DS2-VASc Score of 6.  Start Apixaban (Eliquis).   Discussion regarding anticoagulation and risk of stroke secondary to new diagnosis. After discussion of risks vs benefits, he is willing to proceed with anticoagulation.  Discussion with primary cardiologist, agrees  it is reasonable to discontinue aspirin and begin Eliquis.  Plan to see him in 1 month for  repeat CBC.   3. Obesity Body mass index is 40.23 kg/m. Lifestyle modification was discussed at length including regular exercise and weight reduction. Encouraged daily walking   4. Snoring  with concern for Obstructive sleep apnea It doesn't appear at this point he has strong symptoms for sleep study but asked him to please confirm no stop breathing at night.   5. HTN Elevated today, he admits it is high because he couldn't find clinic and parking was difficult. He will trend at home and does endorse his home readings are in the 130s.    Follow up in 1 month Afib clinic to reassess burden on ILR. Repeat CBC.   Lake BellsJoseph Chauncey Bruno, PA-C Afib Clinic [facility] [address] [place], [place] [address] [phone] 05/12/2022 11:21 AM

## 2022-05-13 ENCOUNTER — Telehealth: Payer: Medicare Other

## 2022-05-14 ENCOUNTER — Other Ambulatory Visit: Payer: Self-pay | Admitting: Family Medicine

## 2022-05-14 DIAGNOSIS — I10 Essential (primary) hypertension: Secondary | ICD-10-CM

## 2022-05-14 DIAGNOSIS — E119 Type 2 diabetes mellitus without complications: Secondary | ICD-10-CM

## 2022-05-19 ENCOUNTER — Telehealth: Payer: Self-pay

## 2022-05-19 ENCOUNTER — Telehealth: Payer: Medicare Other

## 2022-05-19 NOTE — Telephone Encounter (Signed)
   CCM RN Visit Note   05/19/22 Name: [name] MRN: [id number]      DOB: Dec 23, 1955  Subjective: Joe Stewart is a 67 y.o. year old male who is a primary care patient of Garnette Gunner, MD. The patient was referred to the Chronic Care Management team for assistance with care management needs subsequent to provider initiation of CCM services and plan of care.      An unsuccessful outreach attempt was made today for a scheduled CCM visit.   PLAN: A HIPAA compliant phone message was left for the patient providing contact information and requesting a return call.   Katha Cabal RN Care Manager/Chronic Care Management [phone]

## 2022-05-26 ENCOUNTER — Ambulatory Visit (INDEPENDENT_AMBULATORY_CARE_PROVIDER_SITE_OTHER): Payer: Medicare Other

## 2022-05-26 DIAGNOSIS — I639 Cerebral infarction, unspecified: Secondary | ICD-10-CM

## 2022-05-27 LAB — CUP PACEART REMOTE DEVICE CHECK
Date Time Interrogation Session: 20240422230135
Implantable Pulse Generator Implant Date: 20230407

## 2022-06-02 NOTE — Progress Notes (Signed)
[facility] Summary Report / Loop Recorder 

## 2022-06-05 ENCOUNTER — Encounter (INDEPENDENT_AMBULATORY_CARE_PROVIDER_SITE_OTHER): Payer: Medicare Other | Admitting: Ophthalmology

## 2022-06-05 DIAGNOSIS — Z7984 Long term (current) use of oral hypoglycemic drugs: Secondary | ICD-10-CM

## 2022-06-05 DIAGNOSIS — H35033 Hypertensive retinopathy, bilateral: Secondary | ICD-10-CM

## 2022-06-05 DIAGNOSIS — I10 Essential (primary) hypertension: Secondary | ICD-10-CM

## 2022-06-05 DIAGNOSIS — E113313 Type 2 diabetes mellitus with moderate nonproliferative diabetic retinopathy with macular edema, bilateral: Secondary | ICD-10-CM

## 2022-06-05 DIAGNOSIS — I639 Cerebral infarction, unspecified: Secondary | ICD-10-CM

## 2022-06-05 DIAGNOSIS — H547 Unspecified visual loss: Secondary | ICD-10-CM

## 2022-06-05 DIAGNOSIS — Z961 Presence of intraocular lens: Secondary | ICD-10-CM

## 2022-06-05 NOTE — Progress Notes (Signed)
[facility] - Clinic Note  06/08/2022    CHIEF COMPLAINT Patient presents for Retina Follow Up   HISTORY OF PRESENT ILLNESS: Joe Stewart is a 67 y.o. male who presents to the clinic today for:  HPI     Retina Follow Up   Patient presents with  Other.  In both eyes.  Severity is moderate.  Duration of 5 weeks.  Since onset it is stable.  I, the attending physician,  performed the HPI with the patient and updated documentation appropriately.        Comments   Patient states vision the same OU.      Last edited by Rennis Chris, MD on 06/10/2022  5:16 AM.     Pt states his blood sugar has been good, BP has been a little high   Referring physician: Alma Downs, PA [address] [place], [place] [address]  HISTORICAL INFORMATION:   Selected notes from the MEDICAL RECORD NUMBER Referred by Mathis Fare Lunduist, PA LEE: 09.15.21 BCVA OD: 20/20 OS: 20/20 Ocular Hx- NPDR with edema  PMH- DM, HTN   CURRENT MEDICATIONS: No current outpatient medications on file. (Ophthalmic Drugs)   No current facility-administered medications for this visit. (Ophthalmic Drugs)   Current Outpatient Medications (Other)  Medication Sig   amLODipine (NORVASC) 10 MG tablet TAKE 1 TABLET BY MOUTH EVERY DAY   apixaban (ELIQUIS) 5 MG TABS tablet Take 1 tablet (5 mg total) by mouth 2 (two) times daily.   atorvastatin (LIPITOR) 80 MG tablet Take 1 tablet (80 mg total) by mouth daily.   blood glucose meter kit and supplies KIT Dispense based on patient and insurance preference. Use up to four times daily as directed.   glipiZIDE (GLUCOTROL) 5 MG tablet TAKE 1 TABLET (5 MG TOTAL) BY MOUTH TWICE A DAY BEFORE MEALS   hydrochlorothiazide (HYDRODIURIL) 25 MG tablet Take 1 tablet (25 mg total) by mouth daily.   JARDIANCE 25 MG TABS tablet TAKE 1 TABLET (25 MG TOTAL) BY MOUTH DAILY.   losartan (COZAAR) 100 MG tablet Take 1 tablet (100 mg total) by mouth daily.   metFORMIN  (GLUCOPHAGE) 500 MG tablet Take 1 tablet (500 mg total) by mouth 2 (two) times daily with a meal.   metoprolol succinate (TOPROL-XL) 50 MG 24 hr tablet Take 1 tablet (50 mg total) by mouth daily. Take with or immediately following a meal.   No current facility-administered medications for this visit. (Other)   REVIEW OF SYSTEMS: ROS   Positive for: Gastrointestinal, Neurological, Musculoskeletal, Endocrine, Eyes Negative for: Constitutional, Skin, Genitourinary, HENT, Cardiovascular, Respiratory, Psychiatric, Allergic/Imm, Heme/Lymph Last edited by Annalee Genta D, COT on 06/08/2022  7:35 AM.       ALLERGIES No Known Allergies  PAST MEDICAL HISTORY Past Medical History:  Diagnosis Date   Arthritis    Cataract    Mixed OU   Diabetes mellitus without complication (HCC)    Diabetic retinopathy (HCC)    NPDR OU   Hyperlipidemia    Hypertension    Hypertensive retinopathy    OU   Obesity    Stroke [facility])    Past Surgical History:  Procedure Laterality Date   BIOPSY  11/29/2019   Procedure: BIOPSY;  Surgeon: Lemar Lofty., MD;  Location: [facility];  Service: Gastroenterology;;   COLONOSCOPY WITH PROPOFOL N/A 11/29/2019   Procedure: COLONOSCOPY WITH PROPOFOL;  Surgeon: Lemar Lofty., MD;  Location: [facility];  Service: Gastroenterology;  Laterality: N/A;  ENDOSCOPIC MUCOSAL RESECTION N/A 11/29/2019   Procedure: ENDOSCOPIC MUCOSAL RESECTION;  Surgeon: Meridee Score Netty Starring., MD;  Location: [facility];  Service: Gastroenterology;  Laterality: N/A;   ESOPHAGOGASTRODUODENOSCOPY (EGD) WITH PROPOFOL N/A 11/29/2019   Procedure: ESOPHAGOGASTRODUODENOSCOPY (EGD) WITH PROPOFOL;  Surgeon: Meridee Score Netty Starring., MD;  Location: [facility];  Service: Gastroenterology;  Laterality: N/A;   EYE SURGERY Left    had to have eye flushed after getting costic sodium in L eye   HEMOSTASIS CLIP PLACEMENT  11/29/2019   Procedure: HEMOSTASIS CLIP PLACEMENT;  Surgeon:  Lemar Lofty., MD;  Location: [facility];  Service: Gastroenterology;;   HERNIA REPAIR     HERNIA REPAIR     35 years ago   HOT HEMOSTASIS N/A 11/29/2019   Procedure: HOT HEMOSTASIS (ARGON PLASMA COAGULATION/BICAP);  Surgeon: Lemar Lofty., MD;  Location: [facility];  Service: Gastroenterology;  Laterality: N/A;   JOINT REPLACEMENT  2021   Left knee replacement   LEFT HEART CATH AND CORONARY ANGIOGRAPHY N/A 04/11/2019   Procedure: LEFT HEART CATH AND CORONARY ANGIOGRAPHY;  Surgeon: Yates Decamp, MD;  Location: [facility];  Service: Cardiovascular;  Laterality: N/A;   POLYPECTOMY  11/29/2019   Procedure: POLYPECTOMY;  Surgeon: Meridee Score Netty Starring., MD;  Location: [facility];  Service: Gastroenterology;;   RENAL ANGIOGRAPHY Bilateral 04/11/2019   Procedure: RENAL ANGIOGRAPHY;  Surgeon: Yates Decamp, MD;  Location: [facility] INVASIVE CV LAB;  Service: Cardiovascular;  Laterality: Bilateral;   SUBMUCOSAL LIFTING INJECTION  11/29/2019   Procedure: SUBMUCOSAL LIFTING INJECTION;  Surgeon: Lemar Lofty., MD;  Location: [facility];  Service: Gastroenterology;;   TOTAL KNEE ARTHROPLASTY Left 08/01/2019   Procedure: LEFT TOTAL KNEE ARTHROPLASTY;  Surgeon: Cammy Copa, MD;  Location: [facility];  Service: Orthopedics;  Laterality: Left;   FAMILY HISTORY Family History  Problem Relation Age of Onset   Diabetes Mother    COPD Mother    Cancer Mother        unknown type    Heart disease Father    COPD Sister    Healthy Brother    Heart disease Brother    Healthy Son    Colon cancer Neg Hx    Colon polyps Neg Hx    Esophageal cancer Neg Hx    Rectal cancer Neg Hx    Stomach cancer Neg Hx    Inflammatory bowel disease Neg Hx    Liver disease Neg Hx    Pancreatic cancer Neg Hx    SOCIAL HISTORY Social History   Tobacco Use   Smoking status: Never   Smokeless tobacco: Never  Vaping Use   Vaping Use: Never used  Substance Use Topics   Alcohol use:  Not Currently   Drug use: Never       OPHTHALMIC EXAM:  Base Eye Exam     Visual Acuity (Snellen - Linear)       Right Left   Dist [place] 20/30 +2 20/30 +2   Dist ph  20/25 20/25    Correction: Glasses         Tonometry (Tonopen, 7:43 AM)       Right Left   Pressure 16 17         Pupils       Dark Light Shape React APD   Right 3 2 Round Brisk None   Left 3 2 Round Brisk None         Visual Fields       Left Right  Restrictions Partial outer superior nasal deficiency Total superior temporal deficiency         Extraocular Movement       Right Left    Full, Ortho Full, Ortho         Neuro/Psych     Oriented x3: Yes   Mood/Affect: Normal         Dilation     Both eyes: 1.0% Mydriacyl, 2.5% Phenylephrine @ 7:44 AM           Slit Lamp and Fundus Exam     Slit Lamp Exam       Right Left   Lids/Lashes Dermatochalasis - upper lid, Meibomian gland dysfunction Dermatochalasis - upper lid, Meibomian gland dysfunction   Conjunctiva/Sclera White and quiet White and quiet   Cornea 1+Punctate epithelial erosions, well healed cataract wound 1+ Punctate epithelial erosions, well healed cataract wound   Anterior Chamber Deep and quiet, deep, clear, narrow temporal angle Deep and quiet   Iris Round and moderately dilated, No NVI Round and moderately dilated to 6.48mm, No NVI, PPM nasal   Lens PC IOL in good position, trace Posterior capsular opacification PC IOL in good position, trace Posterior capsular opacification   Anterior Vitreous Vitreous syneresis, mild vitreous condensations Vitreous syneresis         Fundus Exam       Right Left   Disc mild Pallor, Sharp rim, mild PPA Pink and Sharp, +Peripapillary atrophy, Compact   C/D Ratio 0.2 0.1   Macula Flat, Blunted foveal reflex, scattered MA/IRH, RPE mottling and clumping, trace cystic changes centrally Flat, Blunted foveal reflex, rare MA, focal cluster of cystic changes superior macula --  slightly increased, no exudates   Vessels attenuated, Tortuous attenuated, Tortuous   Periphery Attached, pigmented cystoid degeneration ST periphery, scattered MA/IRH greatest posteriorly, focal pigmented VR tuft at 0800 Attached, scattered MA greatest posteriorly, focal pigmented CR scarring along distal ST arcades (0130)            IMAGING AND PROCEDURES  Imaging and Procedures for 06/08/2022  OCT, Retina - OU - Both Eyes       Right Eye Quality was good. Central Foveal Thickness: 354. Progression has worsened. Findings include normal foveal contour, no SRF, intraretinal hyper-reflective material, intraretinal fluid (Interval development of focal cyst temporal macula, interval increase in cystic changes, very shallow schisis ST periphery caught on widefield--not imaged today).   Left Eye Quality was good. Central Foveal Thickness: 337. Progression has worsened. Findings include no SRF, abnormal foveal contour, intraretinal hyper-reflective material, intraretinal fluid, vitreomacular adhesion (Mild interval increase in IRF/central edema greatest superior macula and fovea).   Notes *Images captured and stored on drive  Diagnosis / Impression:  DME OU OD: Interval development of focal cyst temporal macula, interval increase in cystic changes, very shallow schisis ST periphery caught on widefield--not imaged today OS: Mild interval increase in IRF/central edema greatest superior macula and fovea  Clinical management:  See below  Abbreviations: NFP - Normal foveal profile. CME - cystoid macular edema. PED - pigment epithelial detachment. IRF - intraretinal fluid. SRF - subretinal fluid. EZ - ellipsoid zone. ERM - epiretinal membrane. ORA - outer retinal atrophy. ORT - outer retinal tubulation. SRHM - subretinal hyper-reflective material. IRHM - intraretinal hyper-reflective material      Intravitreal Injection, Pharmacologic Agent - OS - Left Eye       Time Out 06/08/2022. 8:03  AM. Confirmed correct patient, procedure, site, and patient consented.   Anesthesia Topical anesthesia  was used. Anesthetic medications included Lidocaine 2%, Proparacaine 0.5%.   Procedure Preparation included 5% betadine to ocular surface, eyelid speculum. A supplied (32g) needle was used.   Injection: 1.25 mg Bevacizumab 1.25mg /0.52ml   Route: Intravitreal, Site: Left Eye   NDC: P3213405, Lot: [id number] , Expiration date: 07/03/2022   Post-op Post injection exam found visual acuity of at least counting fingers. The patient tolerated the procedure well. There were no complications. The patient received written and verbal post procedure care education.            ASSESSMENT/PLAN:    ICD-10-CM   1. Acute CVA (cerebrovascular accident) (HCC)  I63.9     2. Visual field loss following cerebrovascular accident  I69.398    H54.7     3. 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 Long term (current) use of oral hypoglycemic drugs  Z79.84     5. Essential hypertension  I10     6. Hypertensive retinopathy of both eyes  H35.033     7. Pseudophakia, both eyes  Z96.1     8. Combined forms of age-related cataract of both eyes  H25.813      1,2. 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  - BCVA was improved to 20/25 OU post cataract sx, today -- 20/25 OD, 20/30 OS  - baseline visual field obtained, 04.24.23 -- right homonymous superior quadrantanopia consistent w/ L PCA stroke  - monitor  3,4. Moderate  Non-proliferative diabetic retinopathy w/ DME OU  - pt lost to f/u from Mar 2022 to Feb 2023 (11 mos)  - S/P IVA OD #1 (10.26.21), #2 (11.24.21), #3 (12.22.21), #4 (03/06/20), #5 (03.23.22)  - S/P IVA OS #1 (03.23.22), #2 (01.05.24), #3 (02.16.24), #4 (03.29.24)             - A1c 6.9% 12.12.23; Max 12.3% on 01.19.23 - BCVA 20/25 OU - exam shows scattered MA and mild edema/cystic changes OU -- improving - FA (09.29.21) shows late leaking MA OU; no NV OU  - OCT shows OD: Interval development of focal cyst temporal macula, interval increase in cystic changes, very shallow schisis ST periphery caught on widefield--not imaged today; OS: Mild interval increase in IRF/central edema greatest superior macula and fovea at 5 weeks  **discussed possible resistance to IVA -- Eylea approved for 2024**  - recommend IVA OS #5 today, 05.06.24 with follow up back to 4 weeks  - pt wishes to proceed with injection  - RBA of procedure discussed, questions answered - IVA informed consent obtained and signed, 01.05.24 (OS) - see procedure note  - f/u 4 weeks - DFE, OCT, possible injxn(s)  5,6. Hypertensive retinopathy OU - discussed importance of tight BP control - monitor  7. Pseudophakia OU  - s/p CE/IOL (Dr. Laruth Bouchard, ~August 2023)  - IOL in good position, doing well  - monitor   Ophthalmic Meds Ordered this visit:  Meds ordered this encounter  Medications   Bevacizumab (AVASTIN) SOLN 1.25 mg    Return in about 4 weeks (around 07/06/2022) for  f/u NPDR OU, DFE, OCT.  There are no Patient Instructions on file for this visit.  This document serves as a record of services personally performed by Karie Chimera, MD, PhD. It was created on their behalf by Glee Arvin. Manson Passey, OA an ophthalmic technician. The creation of this record is the provider's dictation and/or activities during the visit.    Electronically signed by: Glee Arvin. Manson Passey, [place] 05.03.2024 5:16 AM  Karie Chimera, M.D., Ph.D. Diseases &  Surgery of the Retina and Vitreous [facility]  I have reviewed the above documentation for accuracy and completeness, and I agree with the above. Karie Chimera, M.D., Ph.D. 06/10/22 5:19 AM   Abbreviations: M myopia (nearsighted); A astigmatism; H hyperopia (farsighted); P presbyopia; Mrx spectacle prescription;  CTL contact lenses; OD right eye; OS left eye; OU both eyes  XT exotropia; ET esotropia; PEK punctate epithelial keratitis; PEE punctate epithelial erosions; DES dry eye syndrome; MGD meibomian gland dysfunction; ATs artificial tears; PFAT's preservative free artificial tears; NSC nuclear sclerotic cataract; PSC posterior subcapsular cataract; ERM epi-retinal membrane; PVD posterior vitreous detachment; RD retinal detachment; DM diabetes mellitus; DR diabetic retinopathy; NPDR non-proliferative diabetic retinopathy; PDR proliferative diabetic retinopathy; CSME clinically significant macular edema; DME diabetic macular edema; dbh dot blot hemorrhages; CWS cotton wool spot; POAG primary open angle glaucoma; C/D cup-to-disc ratio; HVF humphrey visual field; GVF goldmann visual field; OCT optical coherence tomography; IOP intraocular pressure; BRVO Branch retinal vein occlusion; CRVO central retinal vein occlusion; CRAO central retinal artery occlusion; BRAO branch retinal artery occlusion; RT retinal tear; SB scleral buckle; PPV pars plana vitrectomy; VH Vitreous hemorrhage; PRP panretinal laser photocoagulation; IVK intravitreal kenalog; VMT vitreomacular traction; MH Macular hole;  NVD neovascularization of the disc; NVE neovascularization elsewhere; AREDS age related eye disease study; ARMD age related macular degeneration; POAG primary open angle glaucoma; EBMD epithelial/anterior basement membrane dystrophy; ACIOL anterior chamber intraocular lens; IOL intraocular lens; PCIOL posterior chamber intraocular lens; Phaco/IOL phacoemulsification with intraocular lens placement;  PRK photorefractive keratectomy; LASIK laser assisted in situ keratomileusis; HTN hypertension; DM diabetes mellitus; COPD chronic obstructive pulmonary disease

## 2022-06-08 ENCOUNTER — Encounter (INDEPENDENT_AMBULATORY_CARE_PROVIDER_SITE_OTHER): Payer: Self-pay | Admitting: Ophthalmology

## 2022-06-08 ENCOUNTER — Ambulatory Visit (INDEPENDENT_AMBULATORY_CARE_PROVIDER_SITE_OTHER): Payer: Medicare Other | Admitting: Ophthalmology

## 2022-06-08 DIAGNOSIS — H35033 Hypertensive retinopathy, bilateral: Secondary | ICD-10-CM

## 2022-06-08 DIAGNOSIS — I10 Essential (primary) hypertension: Secondary | ICD-10-CM

## 2022-06-08 DIAGNOSIS — Z7984 Long term (current) use of oral hypoglycemic drugs: Secondary | ICD-10-CM

## 2022-06-08 DIAGNOSIS — E113313 Type 2 diabetes mellitus with moderate nonproliferative diabetic retinopathy with macular edema, bilateral: Secondary | ICD-10-CM

## 2022-06-08 DIAGNOSIS — H25813 Combined forms of age-related cataract, bilateral: Secondary | ICD-10-CM

## 2022-06-08 DIAGNOSIS — I639 Cerebral infarction, unspecified: Secondary | ICD-10-CM

## 2022-06-08 DIAGNOSIS — Z961 Presence of intraocular lens: Secondary | ICD-10-CM

## 2022-06-08 DIAGNOSIS — H547 Unspecified visual loss: Secondary | ICD-10-CM

## 2022-06-08 MED ORDER — BEVACIZUMAB CHEMO INJECTION 1.25MG/0.05ML SYRINGE FOR KALEIDOSCOPE
1.2500 mg | INTRAVITREAL | Status: AC | PRN
Start: 2022-06-08 — End: 2022-06-08
  Administered 2022-06-08: 1.25 mg via INTRAVITREAL

## 2022-06-10 ENCOUNTER — Encounter (INDEPENDENT_AMBULATORY_CARE_PROVIDER_SITE_OTHER): Payer: Self-pay | Admitting: Ophthalmology

## 2022-06-10 ENCOUNTER — Encounter (HOSPITAL_COMMUNITY): Payer: Self-pay | Admitting: Internal Medicine

## 2022-06-10 ENCOUNTER — Ambulatory Visit (HOSPITAL_COMMUNITY)
Admission: RE | Admit: 2022-06-10 | Discharge: 2022-06-10 | Disposition: A | Discharge status: Home / Self Care | Payer: Medicare Other | Source: Ambulatory Visit | Attending: Internal Medicine | Admitting: Internal Medicine

## 2022-06-10 VITALS — BP 142/70 | HR 73 | Ht 68.0 in | Wt 265.4 lb

## 2022-06-10 DIAGNOSIS — E669 Obesity, unspecified: Secondary | ICD-10-CM | POA: Insufficient documentation

## 2022-06-10 DIAGNOSIS — Z7901 Long term (current) use of anticoagulants: Secondary | ICD-10-CM | POA: Insufficient documentation

## 2022-06-10 DIAGNOSIS — Z95811 Presence of heart assist device: Secondary | ICD-10-CM | POA: Insufficient documentation

## 2022-06-10 DIAGNOSIS — E1122 Type 2 diabetes mellitus with diabetic chronic kidney disease: Secondary | ICD-10-CM | POA: Diagnosis not present

## 2022-06-10 DIAGNOSIS — I2510 Atherosclerotic heart disease of native coronary artery without angina pectoris: Secondary | ICD-10-CM | POA: Insufficient documentation

## 2022-06-10 DIAGNOSIS — Z713 Dietary counseling and surveillance: Secondary | ICD-10-CM | POA: Insufficient documentation

## 2022-06-10 DIAGNOSIS — R0683 Snoring: Secondary | ICD-10-CM | POA: Diagnosis not present

## 2022-06-10 DIAGNOSIS — Z8673 Personal history of transient ischemic attack (TIA), and cerebral infarction without residual deficits: Secondary | ICD-10-CM | POA: Insufficient documentation

## 2022-06-10 DIAGNOSIS — Z8249 Family history of ischemic heart disease and other diseases of the circulatory system: Secondary | ICD-10-CM | POA: Diagnosis not present

## 2022-06-10 DIAGNOSIS — Z79899 Other long term (current) drug therapy: Secondary | ICD-10-CM | POA: Diagnosis not present

## 2022-06-10 DIAGNOSIS — D6869 Other thrombophilia: Secondary | ICD-10-CM | POA: Insufficient documentation

## 2022-06-10 DIAGNOSIS — E7801 Familial hypercholesterolemia: Secondary | ICD-10-CM | POA: Insufficient documentation

## 2022-06-10 DIAGNOSIS — I129 Hypertensive chronic kidney disease with stage 1 through stage 4 chronic kidney disease, or unspecified chronic kidney disease: Secondary | ICD-10-CM | POA: Diagnosis not present

## 2022-06-10 DIAGNOSIS — N1831 Chronic kidney disease, stage 3a: Secondary | ICD-10-CM | POA: Diagnosis not present

## 2022-06-10 DIAGNOSIS — I4510 Unspecified right bundle-branch block: Secondary | ICD-10-CM | POA: Insufficient documentation

## 2022-06-10 DIAGNOSIS — Z7984 Long term (current) use of oral hypoglycemic drugs: Secondary | ICD-10-CM | POA: Diagnosis not present

## 2022-06-10 DIAGNOSIS — Z7982 Long term (current) use of aspirin: Secondary | ICD-10-CM | POA: Insufficient documentation

## 2022-06-10 DIAGNOSIS — I480 Paroxysmal atrial fibrillation: Secondary | ICD-10-CM | POA: Diagnosis not present

## 2022-06-10 DIAGNOSIS — I440 Atrioventricular block, first degree: Secondary | ICD-10-CM | POA: Insufficient documentation

## 2022-06-10 DIAGNOSIS — Z6841 Body Mass Index (BMI) 40.0 and over, adult: Secondary | ICD-10-CM | POA: Diagnosis not present

## 2022-06-10 LAB — CBC
HCT: 37.7 % — ABNORMAL LOW (ref 39.0–52.0)
Hemoglobin: 12.8 g/dL — ABNORMAL LOW (ref 13.0–17.0)
MCH: 30.7 pg (ref 26.0–34.0)
MCHC: 34 g/dL (ref 30.0–36.0)
MCV: 90.4 fL (ref 80.0–100.0)
Platelets: 199 10*3/uL (ref 150–400)
RBC: 4.17 MIL/uL — ABNORMAL LOW (ref 4.22–5.81)
RDW: 13.7 % (ref 11.5–15.5)
WBC: 7.1 10*3/uL (ref 4.0–10.5)
nRBC: 0 % (ref 0.0–0.2)

## 2022-06-10 NOTE — Progress Notes (Signed)
Primary Care Physician: Garnette Gunner, MD Primary Cardiologist: Dr. Izora Ribas Primary Electrophysiologist: Dr. Ladona Ridgel Referring Physician:    Bayardo Stewart is a 67 y.o. male with a history of moderate nonobstructive CAD, CKD stage IIIa, DM2, HTN, familial triglyceridemia, prior stroke s/p ILR placement, and paroxysmal atrial fibrillation who presents for consultation in the [facility]. He was evaluated by Dr. Ladona Ridgel on 05/09/21 for cryptogenic stroke and had ILR placed. The patient was recently diagnosed with atrial fibrillation due to ILR alert demonstrating ~6 hr episode of Afib on 05/09/22 with max HR 130. Patient has a CHADS2VASC score of 6.  On evaluation 05/12/22, he is in SR today. ILR alert for new episode of Afib was for ~6 hours in duration and began at 2345 that evening. He states he did not feel that episode and was most likely asleep. He had won $1,300 earlier that evening playing blackjack in casino but did not feel any symptoms at that time. He is currently on ASA 81 mg daily and had a nosebleed 3 weeks ago for the first time which was odd to him. No more epistaxis since then. He does not drink alcohol. His brother and father were diagnosed with Afib; father is deceased. Some snoring with sleep but seems to have improved now that he is sleeping on his side.   On follow up 06/10/22, he is in SR today. ILR review through [employer] site today shows no new episodes of Afib since initial episode on 05/09/22. He feels well overall. He is compliant with Toprol and no missed doses of Eliquis.   Today, he denies symptoms of palpitations, chest pain, shortness of breath, orthopnea, PND, lower extremity edema, dizziness, presyncope, syncope, snoring, daytime somnolence, bleeding, or neurologic sequela. The patient is tolerating medications without difficulties and is otherwise without complaint today.   Atrial Fibrillation Risk Factors:  he does not have symptoms or  diagnosis of sleep apnea. he does not have a history of rheumatic fever. he does not have a history of alcohol use. The patient does have a history of early familial atrial fibrillation or other arrhythmias.  he has a BMI of Body mass index is 40.35 kg/m.Marland Kitchen Filed Weights   06/10/22 1059  Weight: 120.4 kg     Family History  Problem Relation Age of Onset   Diabetes Mother    COPD Mother    Cancer Mother        unknown type    Heart disease Father    COPD Sister    Healthy Brother    Heart disease Brother    Healthy Son    Colon cancer Neg Hx    Colon polyps Neg Hx    Esophageal cancer Neg Hx    Rectal cancer Neg Hx    Stomach cancer Neg Hx    Inflammatory bowel disease Neg Hx    Liver disease Neg Hx    Pancreatic cancer Neg Hx      Atrial Fibrillation Management history:  Previous antiarrhythmic drugs: None Previous cardioversions: None Previous ablations: None Anticoagulation history: Eliquis 5 mg BID   Past Medical History:  Diagnosis Date   Arthritis    Cataract    Mixed OU   Diabetes mellitus without complication (HCC)    Diabetic retinopathy (HCC)    NPDR OU   Hyperlipidemia    Hypertension    Hypertensive retinopathy    OU   Obesity    Stroke [facility])    Past Surgical  History:  Procedure Laterality Date   BIOPSY  11/29/2019   Procedure: BIOPSY;  Surgeon: Meridee Score Netty Starring., MD;  Location: [facility];  Service: Gastroenterology;;   COLONOSCOPY WITH PROPOFOL N/A 11/29/2019   Procedure: COLONOSCOPY WITH PROPOFOL;  Surgeon: Lemar Lofty., MD;  Location: [facility];  Service: Gastroenterology;  Laterality: N/A;   ENDOSCOPIC MUCOSAL RESECTION N/A 11/29/2019   Procedure: ENDOSCOPIC MUCOSAL RESECTION;  Surgeon: Meridee Score Netty Starring., MD;  Location: [facility];  Service: Gastroenterology;  Laterality: N/A;   ESOPHAGOGASTRODUODENOSCOPY (EGD) WITH PROPOFOL N/A 11/29/2019   Procedure: ESOPHAGOGASTRODUODENOSCOPY (EGD) WITH PROPOFOL;   Surgeon: Meridee Score Netty Starring., MD;  Location: [facility];  Service: Gastroenterology;  Laterality: N/A;   EYE SURGERY Left    had to have eye flushed after getting costic sodium in L eye   HEMOSTASIS CLIP PLACEMENT  11/29/2019   Procedure: HEMOSTASIS CLIP PLACEMENT;  Surgeon: Lemar Lofty., MD;  Location: [facility];  Service: Gastroenterology;;   HERNIA REPAIR     HERNIA REPAIR     35 years ago   HOT HEMOSTASIS N/A 11/29/2019   Procedure: HOT HEMOSTASIS (ARGON PLASMA COAGULATION/BICAP);  Surgeon: Lemar Lofty., MD;  Location: [facility];  Service: Gastroenterology;  Laterality: N/A;   JOINT REPLACEMENT  2021   Left knee replacement   LEFT HEART CATH AND CORONARY ANGIOGRAPHY N/A 04/11/2019   Procedure: LEFT HEART CATH AND CORONARY ANGIOGRAPHY;  Surgeon: Yates Decamp, MD;  Location: [facility]VAS[facility] CV LAB;  Service: Cardiovascular;  Laterality: N/A;   POLYPECTOMY  11/29/2019   Procedure: POLYPECTOMY;  Surgeon: Meridee Score Netty Starring., MD;  Location: [facility];  Service: Gastroenterology;;   RENAL ANGIOGRAPHY Bilateral 04/11/2019   Procedure: RENAL ANGIOGRAPHY;  Surgeon: Yates Decamp, MD;  Location: [facility] INVASIVE CV LAB;  Service: Cardiovascular;  Laterality: Bilateral;   SUBMUCOSAL LIFTING INJECTION  11/29/2019   Procedure: SUBMUCOSAL LIFTING INJECTION;  Surgeon: Lemar Lofty., MD;  Location: [facility];  Service: Gastroenterology;;   TOTAL KNEE ARTHROPLASTY Left 08/01/2019   Procedure: LEFT TOTAL KNEE ARTHROPLASTY;  Surgeon: Cammy Copa, MD;  Location: [facility] OR;  Service: Orthopedics;  Laterality: Left;    Current Outpatient Medications  Medication Sig Dispense Refill   amLODipine (NORVASC) 10 MG tablet TAKE 1 TABLET BY MOUTH EVERY DAY 90 tablet 0   apixaban (ELIQUIS) 5 MG TABS tablet Take 1 tablet (5 mg total) by mouth 2 (two) times daily. 60 tablet 3   atorvastatin (LIPITOR) 80 MG tablet Take 1 tablet (80 mg total) by mouth daily. 90 tablet 1    glipiZIDE (GLUCOTROL) 5 MG tablet TAKE 1 TABLET (5 MG TOTAL) BY MOUTH TWICE A DAY BEFORE MEALS 180 tablet 1   hydrochlorothiazide (HYDRODIURIL) 25 MG tablet Take 1 tablet (25 mg total) by mouth daily. 90 tablet 3   JARDIANCE 25 MG TABS tablet TAKE 1 TABLET (25 MG TOTAL) BY MOUTH DAILY. 30 tablet 2   losartan (COZAAR) 100 MG tablet Take 1 tablet (100 mg total) by mouth daily. 90 tablet 0   metFORMIN (GLUCOPHAGE) 500 MG tablet Take 1 tablet (500 mg total) by mouth 2 (two) times daily with a meal. 180 tablet 3   metoprolol succinate (TOPROL-XL) 50 MG 24 hr tablet Take 1 tablet (50 mg total) by mouth daily. Take with or immediately following a meal. 90 tablet 3   blood glucose meter kit and supplies KIT Dispense based on patient and insurance preference. Use up to four times daily as directed. 1 each 0   No current facility-administered  medications for this encounter.    No Known Allergies  Social History   Socioeconomic History   Marital status: Single    Spouse name: Not on file   Number of children: 1   Years of education: Not on file   Highest education level: Not on file  Occupational History   Not on file  Tobacco Use   Smoking status: Never   Smokeless tobacco: Never  Vaping Use   Vaping Use: Never used  Substance and Sexual Activity   Alcohol use: Not Currently   Drug use: Never   Sexual activity: Not Currently  Other Topics Concern   Not on file  Social History Narrative   Not on file   Social Determinants of Health   Financial Resource Strain: Low Risk  (05/28/2021)   Overall Financial Resource Strain (CARDIA)    Difficulty of Paying Living Expenses: Not hard at all  Food Insecurity: No Food Insecurity (05/28/2021)   Hunger Vital Sign    Worried About Running Out of Food in the Last Year: Never true    Ran Out of Food in the Last Year: Never true  Transportation Needs: No Transportation Needs (05/28/2021)   PRAPARE - Administrator, Civil Service  (Medical): No    Lack of Transportation (Non-Medical): No  Physical Activity: Inactive (05/28/2021)   Exercise Vital Sign    Days of Exercise per Week: 0 days    Minutes of Exercise per Session: 0 min  Stress: No Stress Concern Present (05/28/2021)   [employer] of Occupational Health - Occupational Stress Questionnaire    Feeling of Stress : Only a little  Social Connections: Moderately Isolated (05/28/2021)   Social Connection and Isolation Panel [NHANES]    Frequency of Communication with Friends and Family: Three times a week    Frequency of Social Gatherings with Friends and Family: Three times a week    Attends Religious Services: More than 4 times per year    Active Member of Clubs or Organizations: No    Attends Banker Meetings: Never    Marital Status: Divorced  Catering manager Violence: Not At Risk (05/28/2021)   Humiliation, Afraid, Rape, and Kick questionnaire    Fear of Current or Ex-Partner: No    Emotionally Abused: No    Physically Abused: No    Sexually Abused: No     ROS- All systems are reviewed and negative except as per the HPI above.  Physical Exam: Vitals:   06/10/22 1059  BP: (!) 142/70  Pulse: 73  Weight: 120.4 kg  Height: 5\' 8"  (1.727 m)     GEN- The patient is well appearing, alert and oriented x 3 today.   Head- normocephalic, atraumatic Eyes-  Sclera clear, conjunctiva pink Ears- hearing intact Oropharynx- clear Neck- supple, no JVP Lymph- no cervical lymphadenopathy Lungs- Clear to ausculation bilaterally, normal work of breathing Heart- Regular rate and rhythm, no murmurs, rubs or gallops, PMI not laterally displaced GI- soft, NT, ND, + BS Extremities- no clubbing, cyanosis, or edema MS- no significant deformity or atrophy Skin- no rash or lesion Psych- euthymic mood, full affect Neuro- strength and sensation are intact   Wt Readings from Last 3 Encounters:  06/10/22 120.4 kg  05/12/22 120 kg  04/14/22 118.8 kg     EKG today demonstrates: Vent. rate 73 BPM PR interval 220 ms QRS duration 138 ms QT/QTcB 438/482 ms P-R-T axes 32 31 7 Sinus rhythm with 1st degree A-V block Right  bundle branch block Abnormal ECG When compared with ECG of 12-May-2022 10:36, PREVIOUS ECG IS PRESENT   Echo 02/20/21 demonstrated:  1. Moderate concentric LVH with proximal septal thickening.   2. Left ventricular ejection fraction, by estimation, is 70 to 75%. The  left ventricle has hyperdynamic function. The left ventricle has no  regional wall motion abnormalities. There is moderate concentric left  ventricular hypertrophy. Left ventricular  diastolic parameters are consistent with Grade I diastolic dysfunction  (impaired relaxation). Elevated left atrial pressure.   3. Right ventricular systolic function is normal. The right ventricular  size is normal.   4. The mitral valve is normal in structure. No evidence of mitral valve  regurgitation. No evidence of mitral stenosis.   5. The aortic valve is tricuspid. Aortic valve regurgitation is not  visualized. Aortic valve sclerosis/calcification is present, without any  evidence of aortic stenosis.   6. The inferior vena cava is normal in size with greater than 50%  respiratory variability, suggesting right atrial pressure of 3 mmHg.    Epic records are reviewed at length today.  CHA2DS2-VASc Score = 6  The patient's score is based upon: CHF History: 0 HTN History: 1 Diabetes History: 1 Stroke History: 2 Vascular Disease History: 1 Age Score: 1 Gender Score: 0       ASSESSMENT AND PLAN: Paroxysmal Atrial Fibrillation (ICD10:  I48.0) The patient's CHA2DS2-VASc score is 6, indicating a 9.7% annual risk of stroke.    He is in SR today doing well. ILR review today shows no new episodes of Afib. At this time, we will proceed with conservative observation.  Continue Toprol 50 mg once daily.  2. Secondary Hypercoagulable State (ICD10:  D68.69) The  patient is at significant risk for stroke/thromboembolism based upon his CHA2DS2-VASc Score of 6.  Start Apixaban (Eliquis).   Continue Eliquis 5 mg BID. No missed doses.    3. Obesity Body mass index is 40.35 kg/m. Lifestyle modification was discussed at length including regular exercise and weight reduction. Encouraged daily walking   4. Snoring  with concern for Obstructive sleep apnea It doesn't appear at this point he has strong symptoms for sleep study but asked him to please confirm no stop breathing at night.   5. HTN Elevated today, he admits it is high because he couldn't find clinic and parking was difficult. He will trend at home and does endorse his home readings are in the 130s.    Follow up in 6 months Afib clinic.   Lake Bells, PA-C Afib Clinic [facility] [address] [place], [place] [address] [phone] 06/10/2022 11:31 AM

## 2022-06-24 NOTE — Progress Notes (Signed)
[facility] Summary Report / Loop Recorder 

## 2022-06-25 NOTE — Progress Notes (Signed)
[facility] - Clinic Note  07/06/2022    CHIEF COMPLAINT Patient presents for Retina Follow Up   HISTORY OF PRESENT ILLNESS: Joe Stewart is a 67 y.o. male who presents to the clinic today for:  HPI     Retina Follow Up   Patient presents with  Diabetic Retinopathy.  In both eyes.  This started 4 weeks ago.  Duration of 4 weeks.  Since onset it is stable.  I, the attending physician,  performed the HPI with the patient and updated documentation appropriately.        Comments   4 week retina follow up NPDR and IVA OS  pt is reporting no vision changes noticed he is having floaters in the right eye denies any flashes his last reading was 124 today       Last edited by Rennis Chris, MD on 07/06/2022  1:18 PM.      Referring physician: Alma Downs, PA [address] [place], [place] [address]  HISTORICAL INFORMATION:   Selected notes from the MEDICAL RECORD NUMBER Referred by Mathis Fare Lunduist, PA LEE: 09.15.21 BCVA OD: 20/20 OS: 20/20 Ocular Hx- NPDR with edema  PMH- DM, HTN   CURRENT MEDICATIONS: No current outpatient medications on file. (Ophthalmic Drugs)   No current facility-administered medications for this visit. (Ophthalmic Drugs)   Current Outpatient Medications (Other)  Medication Sig   amLODipine (NORVASC) 10 MG tablet TAKE 1 TABLET BY MOUTH EVERY DAY   apixaban (ELIQUIS) 5 MG TABS tablet Take 1 tablet (5 mg total) by mouth 2 (two) times daily.   atorvastatin (LIPITOR) 80 MG tablet Take 1 tablet (80 mg total) by mouth daily.   blood glucose meter kit and supplies KIT Dispense based on patient and insurance preference. Use up to four times daily as directed.   glipiZIDE (GLUCOTROL) 5 MG tablet TAKE 1 TABLET (5 MG TOTAL) BY MOUTH TWICE A DAY BEFORE MEALS   hydrochlorothiazide (HYDRODIURIL) 25 MG tablet Take 1 tablet (25 mg total) by mouth daily.   JARDIANCE 25 MG TABS tablet TAKE 1 TABLET (25 MG TOTAL) BY MOUTH DAILY.   losartan  (COZAAR) 100 MG tablet Take 1 tablet (100 mg total) by mouth daily.   metFORMIN (GLUCOPHAGE) 500 MG tablet Take 1 tablet (500 mg total) by mouth 2 (two) times daily with a meal.   metoprolol succinate (TOPROL-XL) 50 MG 24 hr tablet Take 1 tablet (50 mg total) by mouth daily. Take with or immediately following a meal.   No current facility-administered medications for this visit. (Other)   REVIEW OF SYSTEMS: ROS   Positive for: Gastrointestinal, Neurological, Musculoskeletal, Endocrine, Eyes Negative for: Constitutional, Skin, Genitourinary, HENT, Cardiovascular, Respiratory, Psychiatric, Allergic/Imm, Heme/Lymph Last edited by Etheleen Mayhew, COT on 07/06/2022 12:54 PM.     ALLERGIES No Known Allergies  PAST MEDICAL HISTORY Past Medical History:  Diagnosis Date   Arthritis    Cataract    Mixed OU   Diabetes mellitus without complication (HCC)    Diabetic retinopathy (HCC)    NPDR OU   Hyperlipidemia    Hypertension    Hypertensive retinopathy    OU   Obesity    Stroke [facility])    Past Surgical History:  Procedure Laterality Date   BIOPSY  11/29/2019   Procedure: BIOPSY;  Surgeon: Lemar Lofty., MD;  Location: [facility];  Service: Gastroenterology;;   COLONOSCOPY WITH PROPOFOL N/A 11/29/2019   Procedure: COLONOSCOPY WITH PROPOFOL;  Surgeon: Corliss Parish  Montez Hageman., MD;  Location: [facility];  Service: Gastroenterology;  Laterality: N/A;   ENDOSCOPIC MUCOSAL RESECTION N/A 11/29/2019   Procedure: ENDOSCOPIC MUCOSAL RESECTION;  Surgeon: Meridee Score Netty Starring., MD;  Location: [facility];  Service: Gastroenterology;  Laterality: N/A;   ESOPHAGOGASTRODUODENOSCOPY (EGD) WITH PROPOFOL N/A 11/29/2019   Procedure: ESOPHAGOGASTRODUODENOSCOPY (EGD) WITH PROPOFOL;  Surgeon: Meridee Score Netty Starring., MD;  Location: [facility];  Service: Gastroenterology;  Laterality: N/A;   EYE SURGERY Left    had to have eye flushed after getting costic sodium in L eye   HEMOSTASIS  CLIP PLACEMENT  11/29/2019   Procedure: HEMOSTASIS CLIP PLACEMENT;  Surgeon: Lemar Lofty., MD;  Location: [facility];  Service: Gastroenterology;;   HERNIA REPAIR     HERNIA REPAIR     35 years ago   HOT HEMOSTASIS N/A 11/29/2019   Procedure: HOT HEMOSTASIS (ARGON PLASMA COAGULATION/BICAP);  Surgeon: Lemar Lofty., MD;  Location: [facility];  Service: Gastroenterology;  Laterality: N/A;   JOINT REPLACEMENT  2021   Left knee replacement   LEFT HEART CATH AND CORONARY ANGIOGRAPHY N/A 04/11/2019   Procedure: LEFT HEART CATH AND CORONARY ANGIOGRAPHY;  Surgeon: Yates Decamp, MD;  Location: [facility] INVASIVE CV LAB;  Service: Cardiovascular;  Laterality: N/A;   POLYPECTOMY  11/29/2019   Procedure: POLYPECTOMY;  Surgeon: Meridee Score Netty Starring., MD;  Location: [facility];  Service: Gastroenterology;;   RENAL ANGIOGRAPHY Bilateral 04/11/2019   Procedure: RENAL ANGIOGRAPHY;  Surgeon: Yates Decamp, MD;  Location: [facility] INVASIVE CV LAB;  Service: Cardiovascular;  Laterality: Bilateral;   SUBMUCOSAL LIFTING INJECTION  11/29/2019   Procedure: SUBMUCOSAL LIFTING INJECTION;  Surgeon: Lemar Lofty., MD;  Location: [facility];  Service: Gastroenterology;;   TOTAL KNEE ARTHROPLASTY Left 08/01/2019   Procedure: LEFT TOTAL KNEE ARTHROPLASTY;  Surgeon: Cammy Copa, MD;  Location: [facility] OR;  Service: Orthopedics;  Laterality: Left;   FAMILY HISTORY Family History  Problem Relation Age of Onset   Diabetes Mother    COPD Mother    Cancer Mother        unknown type    Heart disease Father    COPD Sister    Healthy Brother    Heart disease Brother    Healthy Son    Colon cancer Neg Hx    Colon polyps Neg Hx    Esophageal cancer Neg Hx    Rectal cancer Neg Hx    Stomach cancer Neg Hx    Inflammatory bowel disease Neg Hx    Liver disease Neg Hx    Pancreatic cancer Neg Hx    SOCIAL HISTORY Social History   Tobacco Use   Smoking status: Never   Smokeless tobacco: Never   Vaping Use   Vaping Use: Never used  Substance Use Topics   Alcohol use: Not Currently   Drug use: Never       OPHTHALMIC EXAM:  Base Eye Exam     Visual Acuity (Snellen - Linear)       Right Left   Dist [place] 20/30 -1 20/30 -1   Dist ph [place] NI NI         Tonometry (Tonopen, 12:57 PM)       Right Left   Pressure 18 19  Looking around         Pupils       Pupils Dark Light Shape React APD   Right PERRL 3 2 Round Brisk None   Left PERRL 3 2 Round Brisk None  Visual Fields       Left Right   Restrictions Partial outer superior nasal deficiency Total superior temporal deficiency         Extraocular Movement       Right Left    Full, Ortho Full, Ortho         Neuro/Psych     Oriented x3: Yes   Mood/Affect: Normal         Dilation     Both eyes: 2.5% Phenylephrine @ 12:57 PM           Slit Lamp and Fundus Exam     Slit Lamp Exam       Right Left   Lids/Lashes Dermatochalasis - upper lid, Meibomian gland dysfunction Dermatochalasis - upper lid, Meibomian gland dysfunction   Conjunctiva/Sclera White and quiet White and quiet   Cornea 1+Punctate epithelial erosions, well healed cataract wound 1+ Punctate epithelial erosions, well healed cataract wound   Anterior Chamber Deep and quiet, deep, clear, narrow temporal angle Deep and quiet   Iris Round and moderately dilated, No NVI Round and moderately dilated to 6.2mm, No NVI, PPM nasal   Lens PC IOL in good position, trace Posterior capsular opacification PC IOL in good position, trace Posterior capsular opacification   Anterior Vitreous Vitreous syneresis, mild vitreous condensations Vitreous syneresis         Fundus Exam       Right Left   Disc mild Pallor, Sharp rim, mild PPA Pink and Sharp, +Peripapillary atrophy, Compact   C/D Ratio 0.2 0.1   Macula Flat, Blunted foveal reflex, scattered MA/IRH, RPE mottling and clumping, trace cystic changes centrally -- slightly increased  Flat, Blunted foveal reflex, rare MA, focal cluster of cystic changes superior macula -- slightly increased, no exudates   Vessels attenuated, Tortuous attenuated, Tortuous   Periphery Attached, pigmented cystoid degeneration ST periphery, scattered MA/IRH greatest posteriorly, focal pigmented VR tuft at 0800 Attached, scattered MA greatest posteriorly, focal pigmented CR scarring along distal ST arcades (0130)            IMAGING AND PROCEDURES  Imaging and Procedures for 07/06/2022  OCT, Retina - OU - Both Eyes       Right Eye Quality was good. Central Foveal Thickness: 360. Progression has worsened. Findings include normal foveal contour, no SRF, intraretinal hyper-reflective material, intraretinal fluid (Interval increase in cystic changes temporal fovea and mac, very shallow schisis ST periphery caught on widefield--not imaged today).   Left Eye Quality was good. Central Foveal Thickness: 341. Progression has worsened. Findings include no SRF, abnormal foveal contour, intraretinal hyper-reflective material, intraretinal fluid, vitreomacular adhesion (Persistent IRF/central edema greatest superior macula and fovea -- slightly increased).   Notes *Images captured and stored on drive  Diagnosis / Impression:  DME OU OD: Interval increase in cystic changes temporal fovea and mac, very shallow schisis ST periphery caught on widefield--not imaged today OS: Persistent IRF/central edema greatest superior macula and fovea -- slightly increased  Clinical management:  See below  Abbreviations: NFP - Normal foveal profile. CME - cystoid macular edema. PED - pigment epithelial detachment. IRF - intraretinal fluid. SRF - subretinal fluid. EZ - ellipsoid zone. ERM - epiretinal membrane. ORA - outer retinal atrophy. ORT - outer retinal tubulation. SRHM - subretinal hyper-reflective material. IRHM - intraretinal hyper-reflective material      Intravitreal Injection, Pharmacologic Agent - OD -  Right Eye       Time Out 07/06/2022. 1:24 PM. Confirmed correct patient, procedure, site, and  patient consented.   Anesthesia Topical anesthesia was used. Anesthetic medications included Lidocaine 2%, Proparacaine 0.5%.   Procedure Preparation included 5% betadine to ocular surface, eyelid speculum. A (32g) needle was used.   Injection: 2 mg aflibercept 2 MG/0.05ML   Route: Intravitreal, Site: Right Eye   NDC: L6038910, Lot: [id number], Expiration date: 06/02/2023, Waste: 0 mL   Post-op Post injection exam found visual acuity of at least counting fingers. The patient tolerated the procedure well. There were no complications. The patient received written and verbal post procedure care education. Post injection medications were not given.      Intravitreal Injection, Pharmacologic Agent - OS - Left Eye       Time Out 07/06/2022. 1:25 PM. Confirmed correct patient, procedure, site, and patient consented.   Anesthesia Topical anesthesia was used. Anesthetic medications included Lidocaine 2%, Proparacaine 0.5%.   Procedure Preparation included 5% betadine to ocular surface, eyelid speculum. A (32g) needle was used.   Injection: 2 mg aflibercept 2 MG/0.05ML   Route: Intravitreal, Site: Left Eye   NDC: L6038910, Lot: [id number], Expiration date: 04/02/2023, Waste: 0 mL   Post-op Post injection exam found visual acuity of at least counting fingers. The patient tolerated the procedure well. There were no complications. The patient received written and verbal post procedure care education.            ASSESSMENT/PLAN:    ICD-10-CM   1. Acute CVA (cerebrovascular accident) (HCC)  I63.9     2. Visual field loss following cerebrovascular accident  I69.398    H54.7     3. Moderate nonproliferative diabetic retinopathy of both eyes with macular edema associated with type 2 diabetes mellitus (HCC)  E11.3313 OCT, Retina - OU - Both Eyes    Intravitreal Injection, Pharmacologic  Agent - OD - Right Eye    Intravitreal Injection, Pharmacologic Agent - OS - Left Eye    aflibercept (EYLEA) SOLN 2 mg    aflibercept (EYLEA) SOLN 2 mg    4. Long term (current) use of oral hypoglycemic drugs  Z79.84     5. Essential hypertension  I10     6. Hypertensive retinopathy of both eyes  H35.033     7. Pseudophakia, both eyes  Z96.1       1,2. 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  - BCVA was improved to 20/25 OU post cataract sx, today -- 20/25 OD, 20/30 OS  - baseline visual field obtained, 04.24.23 -- right homonymous superior quadrantanopia consistent w/ L PCA stroke  - monitor  3,4. Moderate Non-proliferative diabetic retinopathy w/ DME OU  - pt lost to f/u from Mar 2022 to Feb 2023 (11 mos)  - S/P IVA OD #1 (10.26.21), #2 (11.24.21), #3 (12.22.21), #4 (03/06/20), #5 (03.23.22)  - S/P IVA OS #1 (03.23.22), #2 (01.05.24), #3 (02.16.24), #4 (03.29.24), #5 (05.06.24)             - A1c 7.3% on  03.12.24; 6.9% on 12.12.23; Max 12.3% on 01.19.23 - BCVA 20/30 OU from 20/25 OU - exam shows scattered MA and mild edema/cystic changes OU -- improving - FA (09.29.21) shows late leaking MA OU; no NV OU  - OCT shows OD: Interval increase in cystic changes temporal fovea and mac, very shallow schisis ST periphery caught  on widefield--not imaged today; OS: Persistent IRF/central edema greatest superior macula and fovea -- slightly increased at 4 wks  **discussed decreased efficacy / resistance to Avastin and potential benefit of switching medication**  - recommend IVE OU #1 today, 06.03.24 with follow up in 4 weeks  - pt wishes to proceed with injection  - RBA of procedure discussed, questions answered - IVA informed consent obtained and  signed, 01.05.24 (OS) - see procedure note  - f/u 4 weeks - DFE, OCT, possible injxn(s)  5,6. Hypertensive retinopathy OU - discussed importance of tight BP control - monitor  7. Pseudophakia OU  - s/p CE/IOL (Dr. Laruth Bouchard, ~August 2023)  - IOLs in good position, doing well  - monitor   Ophthalmic Meds Ordered this visit:  Meds ordered this encounter  Medications   aflibercept (EYLEA) SOLN 2 mg   aflibercept (EYLEA) SOLN 2 mg    Return in about 4 weeks (around 08/03/2022) for +DME, Dilated Exam, OCT, Possible Injxn.  There are no Patient Instructions on file for this visit.  This document serves as a record of services personally performed by Karie Chimera, MD, PhD. It was created on their behalf by De Blanch, an ophthalmic technician. The creation of this record is the provider's dictation and/or activities during the visit.    Electronically signed by: De Blanch, OA, 07/06/22  4:47 PM  This document serves as a record of services personally performed by Karie Chimera, MD, PhD. It was created on their behalf by Glee Arvin. Manson Passey, OA an ophthalmic technician. The creation of this record is the provider's dictation and/or activities during the visit.    Electronically signed by: Glee Arvin. Manson Passey, [place] 06.03.2024 4:47 PM  Karie Chimera, M.D., Ph.D. Diseases & Surgery of the Retina and Vitreous [facility]  I have reviewed the above documentation for accuracy and completeness, and I agree with the above. Karie Chimera, M.D., Ph.D. 07/06/22 4:47 PM  Abbreviations: M myopia (nearsighted); A astigmatism; H hyperopia (farsighted); P presbyopia; Mrx spectacle prescription;  CTL contact lenses; OD right eye; OS left eye; OU both eyes  XT exotropia; ET esotropia; PEK punctate epithelial keratitis; PEE punctate epithelial erosions; DES dry eye syndrome; MGD meibomian gland dysfunction; ATs artificial tears; PFAT's preservative free artificial tears;  NSC nuclear sclerotic cataract; PSC posterior subcapsular cataract; ERM epi-retinal membrane; PVD posterior vitreous detachment; RD retinal detachment; DM diabetes mellitus; DR diabetic retinopathy; NPDR non-proliferative diabetic retinopathy; PDR proliferative diabetic retinopathy; CSME clinically significant macular edema; DME diabetic macular edema; dbh dot blot hemorrhages; CWS cotton wool spot; POAG primary open angle glaucoma; C/D cup-to-disc ratio; HVF humphrey visual field; GVF goldmann visual field; OCT optical coherence tomography; IOP intraocular pressure; BRVO Branch retinal vein occlusion; CRVO central retinal vein occlusion; CRAO central retinal artery occlusion; BRAO branch retinal artery occlusion; RT retinal tear; SB scleral buckle; PPV pars plana vitrectomy; VH Vitreous hemorrhage; PRP panretinal laser photocoagulation; IVK intravitreal kenalog; VMT vitreomacular traction; MH Macular hole;  NVD neovascularization of the disc; NVE neovascularization elsewhere; AREDS age related eye disease study; ARMD age related macular degeneration; POAG primary open angle glaucoma; EBMD epithelial/anterior basement membrane dystrophy; ACIOL anterior chamber intraocular lens; IOL intraocular lens; PCIOL posterior chamber intraocular lens; Phaco/IOL phacoemulsification with intraocular lens placement; PRK photorefractive keratectomy; LASIK laser assisted in situ keratomileusis; HTN hypertension; DM diabetes mellitus; COPD chronic obstructive pulmonary disease

## 2022-06-26 ENCOUNTER — Ambulatory Visit (INDEPENDENT_AMBULATORY_CARE_PROVIDER_SITE_OTHER): Payer: Medicare Other

## 2022-06-26 ENCOUNTER — Ambulatory Visit (HOSPITAL_COMMUNITY)
Admission: RE | Admit: 2022-06-26 | Discharge: 2022-06-26 | Disposition: A | Discharge status: Home / Self Care | Payer: Medicare Other | Source: Ambulatory Visit | Attending: Internal Medicine | Admitting: Internal Medicine

## 2022-06-26 VITALS — Ht 68.0 in | Wt 270.0 lb

## 2022-06-26 DIAGNOSIS — Z0000 Encounter for general adult medical examination without abnormal findings: Secondary | ICD-10-CM | POA: Diagnosis not present

## 2022-06-26 DIAGNOSIS — I480 Paroxysmal atrial fibrillation: Secondary | ICD-10-CM | POA: Insufficient documentation

## 2022-06-26 LAB — CBC
HCT: 41.5 % (ref 39.0–52.0)
Hemoglobin: 13.6 g/dL (ref 13.0–17.0)
MCH: 30.9 pg (ref 26.0–34.0)
MCHC: 32.8 g/dL (ref 30.0–36.0)
MCV: 94.3 fL (ref 80.0–100.0)
Platelets: 217 10*3/uL (ref 150–400)
RBC: 4.4 MIL/uL (ref 4.22–5.81)
RDW: 13.7 % (ref 11.5–15.5)
WBC: 8.8 10*3/uL (ref 4.0–10.5)
nRBC: 0 % (ref 0.0–0.2)

## 2022-06-26 NOTE — Progress Notes (Signed)
I connected with  Karl Ito on 06/26/22 by a audio enabled telemedicine application and verified that I am speaking with the correct person using two identifiers.  Patient Location: Home  Provider Location: Office/Clinic  I discussed the limitations of evaluation and management by telemedicine. The patient expressed understanding and agreed to proceed.  Subjective:   Joe Stewart is a 67 y.o. male who presents for Medicare Annual/Subsequent preventive examination.  Patient Medicare AWV questionnaire was completed by the patient on 06/23/2022; I have confirmed that all information answered by patient is correct and no changes since this date.     Review of Systems     Cardiac Risk Factors include: advanced age (>14men, >41 women);dyslipidemia;hypertension;male gender;obesity (BMI >30kg/m2)     Objective:    Today's Vitals   06/26/22 0812  Weight: 270 lb (122.5 kg)  Height: 5\' 8"  (1.727 m)   Body mass index is 41.05 kg/m.     06/26/2022    8:15 AM 05/28/2021    8:49 AM 03/12/2021    5:30 PM 03/04/2021    8:07 AM 02/20/2021   12:18 PM 02/19/2021    1:28 PM 11/29/2019    6:16 AM  Advanced Directives  Does Patient Have a Medical Advance Directive? No No No No  No No  Would patient like information on creating a medical advance directive?  No - Patient declined No - Patient declined Yes (MAU/Ambulatory/Procedural Areas - Information given) No - Patient declined  No - Patient declined    Current Medications (verified) Outpatient Encounter Medications as of 06/26/2022  Medication Sig   amLODipine (NORVASC) 10 MG tablet TAKE 1 TABLET BY MOUTH EVERY DAY   apixaban (ELIQUIS) 5 MG TABS tablet Take 1 tablet (5 mg total) by mouth 2 (two) times daily.   atorvastatin (LIPITOR) 80 MG tablet Take 1 tablet (80 mg total) by mouth daily.   blood glucose meter kit and supplies KIT Dispense based on patient and insurance preference. Use up to four times daily as directed.   glipiZIDE  (GLUCOTROL) 5 MG tablet TAKE 1 TABLET (5 MG TOTAL) BY MOUTH TWICE A DAY BEFORE MEALS   hydrochlorothiazide (HYDRODIURIL) 25 MG tablet Take 1 tablet (25 mg total) by mouth daily.   JARDIANCE 25 MG TABS tablet TAKE 1 TABLET (25 MG TOTAL) BY MOUTH DAILY.   losartan (COZAAR) 100 MG tablet Take 1 tablet (100 mg total) by mouth daily.   metFORMIN (GLUCOPHAGE) 500 MG tablet Take 1 tablet (500 mg total) by mouth 2 (two) times daily with a meal.   metoprolol succinate (TOPROL-XL) 50 MG 24 hr tablet Take 1 tablet (50 mg total) by mouth daily. Take with or immediately following a meal.   No facility-administered encounter medications on file as of 06/26/2022.    Allergies (verified) Patient has no known allergies.   History: Past Medical History:  Diagnosis Date   Arthritis    Cataract    Mixed OU   Diabetes mellitus without complication (HCC)    Diabetic retinopathy (HCC)    NPDR OU   Hyperlipidemia    Hypertension    Hypertensive retinopathy    OU   Obesity    Stroke [facility])    Past Surgical History:  Procedure Laterality Date   BIOPSY  11/29/2019   Procedure: BIOPSY;  Surgeon: Lemar Lofty., MD;  Location: [facility];  Service: Gastroenterology;;   COLONOSCOPY WITH PROPOFOL N/A 11/29/2019   Procedure: COLONOSCOPY WITH PROPOFOL;  Surgeon: Lemar Lofty., MD;  Location: [facility];  Service: Gastroenterology;  Laterality: N/A;   ENDOSCOPIC MUCOSAL RESECTION N/A 11/29/2019   Procedure: ENDOSCOPIC MUCOSAL RESECTION;  Surgeon: Meridee Score Netty Starring., MD;  Location: [facility];  Service: Gastroenterology;  Laterality: N/A;   ESOPHAGOGASTRODUODENOSCOPY (EGD) WITH PROPOFOL N/A 11/29/2019   Procedure: ESOPHAGOGASTRODUODENOSCOPY (EGD) WITH PROPOFOL;  Surgeon: Meridee Score Netty Starring., MD;  Location: [facility];  Service: Gastroenterology;  Laterality: N/A;   EYE SURGERY Left    had to have eye flushed after getting costic sodium in L eye   HEMOSTASIS CLIP PLACEMENT   11/29/2019   Procedure: HEMOSTASIS CLIP PLACEMENT;  Surgeon: Lemar Lofty., MD;  Location: [facility];  Service: Gastroenterology;;   HERNIA REPAIR     HERNIA REPAIR     35 years ago   HOT HEMOSTASIS N/A 11/29/2019   Procedure: HOT HEMOSTASIS (ARGON PLASMA COAGULATION/BICAP);  Surgeon: Lemar Lofty., MD;  Location: [facility];  Service: Gastroenterology;  Laterality: N/A;   JOINT REPLACEMENT  2021   Left knee replacement   LEFT HEART CATH AND CORONARY ANGIOGRAPHY N/A 04/11/2019   Procedure: LEFT HEART CATH AND CORONARY ANGIOGRAPHY;  Surgeon: Yates Decamp, MD;  Location: [facility] INVASIVE CV LAB;  Service: Cardiovascular;  Laterality: N/A;   POLYPECTOMY  11/29/2019   Procedure: POLYPECTOMY;  Surgeon: Meridee Score Netty Starring., MD;  Location: [facility];  Service: Gastroenterology;;   RENAL ANGIOGRAPHY Bilateral 04/11/2019   Procedure: RENAL ANGIOGRAPHY;  Surgeon: Yates Decamp, MD;  Location: [facility] INVASIVE CV LAB;  Service: Cardiovascular;  Laterality: Bilateral;   SUBMUCOSAL LIFTING INJECTION  11/29/2019   Procedure: SUBMUCOSAL LIFTING INJECTION;  Surgeon: Lemar Lofty., MD;  Location: [facility];  Service: Gastroenterology;;   TOTAL KNEE ARTHROPLASTY Left 08/01/2019   Procedure: LEFT TOTAL KNEE ARTHROPLASTY;  Surgeon: Cammy Copa, MD;  Location[facility];  Service: Orthopedics;  Laterality: Left;   Family History  Problem Relation Age of Onset   Diabetes Mother    COPD Mother    Cancer Mother        unknown type    Heart disease Father    COPD Sister    Healthy Brother    Heart disease Brother    Healthy Son    Colon cancer Neg Hx    Colon polyps Neg Hx    Esophageal cancer Neg Hx    Rectal cancer Neg Hx    Stomach cancer Neg Hx    Inflammatory bowel disease Neg Hx    Liver disease Neg Hx    Pancreatic cancer Neg Hx    Social History   Socioeconomic History   Marital status: Single    Spouse name: Not on file   Number of children: 1   Years  of education: Not on file   Highest education level: Not on file  Occupational History   Not on file  Tobacco Use   Smoking status: Never   Smokeless tobacco: Never  Vaping Use   Vaping Use: Never used  Substance and Sexual Activity   Alcohol use: Not Currently   Drug use: Never   Sexual activity: Not Currently  Other Topics Concern   Not on file  Social History Narrative   Not on file   Social Determinants of Health   Financial Resource Strain: Low Risk  (06/23/2022)   Overall Financial Resource Strain (CARDIA)    Difficulty of Paying Living Expenses: Not hard at all  Food Insecurity: No Food Insecurity (06/23/2022)   Hunger Vital Sign    Worried About Running Out of  Food in the Last Year: Never true    Ran Out of Food in the Last Year: Never true  Transportation Needs: No Transportation Needs (06/23/2022)   PRAPARE - Administrator, Civil Service (Medical): No    Lack of Transportation (Non-Medical): No  Physical Activity: Insufficiently Active (06/23/2022)   Exercise Vital Sign    Days of Exercise per Week: 2 days    Minutes of Exercise per Session: 30 min  Stress: No Stress Concern Present (05/28/2021)   [employer] of Occupational Health - Occupational Stress Questionnaire    Feeling of Stress : Only a little  Social Connections: Unknown (06/23/2022)   Social Connection and Isolation Panel [NHANES]    Frequency of Communication with Friends and Family: More than three times a week    Frequency of Social Gatherings with Friends and Family: Three times a week    Attends Religious Services: Not on file    Active Member of Clubs or Organizations: No    Attends Banker Meetings: Never    Marital Status: Divorced    Tobacco Counseling Counseling given: Not Answered   Clinical Intake:  Pre-visit preparation completed: Yes  Pain : No/denies pain     Nutritional Status: BMI > 30  Obese Nutritional Risks: None Diabetes: Yes  How often  do you need to have someone help you when you read instructions, pamphlets, or other written materials from your doctor or pharmacy?: 1 - Never  Diabetic? Yes Nutrition Risk Assessment:  Has the patient had any N/V/D within the last 2 months?  No  Does the patient have any non-healing wounds?  No  Has the patient had any unintentional weight loss or weight gain?  No   Diabetes:  Is the patient diabetic?  Yes  If diabetic, was a CBG obtained today?  No  Did the patient bring in their glucometer from home?  No  How often do you monitor your CBG's? daily.   Financial Strains and Diabetes Management:  Are you having any financial strains with the device, your supplies or your medication? No .  Does the patient want to be seen by Chronic Care Management for management of their diabetes?  No  Would the patient like to be referred to a Nutritionist or for Diabetic Management?  No   Diabetic Exams:  Diabetic Eye Exam: Completed 07/07/2021 Diabetic Foot Exam: Overdue, Pt has been advised about the importance in completing this exam. Pt is scheduled for diabetic foot exam on next appointment.   Interpreter Needed?: No  Information entered by :: NAllen LPN   Activities of Daily Living    06/23/2022    1:43 PM 05/31/2022   10:48 AM  In your present state of health, do you have any difficulty performing the following activities:  Hearing? 0 0  Vision? 0 0  Difficulty concentrating or making decisions? 0 0  Walking or climbing stairs? 0 0  Dressing or bathing? 0 0  Doing errands, shopping? 0 0  Preparing Food and eating ? N N  Using the Toilet? N N  In the past six months, have you accidently leaked urine? N N  Do you have problems with loss of bowel control? N N  Managing your Medications? N N  Managing your Finances? N N  Housekeeping or managing your Housekeeping? N N    Patient Care Team: Garnette Gunner, MD as PCP - General (Family Medicine) Christell Constant, MD as  PCP - Cardiology (  Cardiology) Sheliah Hatch, MD as Consulting Physician (Family Medicine)  Indicate any recent Medical Services you may have received from other than [facility] providers in the past year (date may be approximate).     Assessment:   This is a routine wellness examination for [place].  Hearing/Vision screen Vision Screening - Comments:: Regular eye exams, Dr. Vanessa Barbara  Dietary issues and exercise activities discussed: Current Exercise Habits: Home exercise routine, Type of exercise: walking, Time (Minutes): 30, Frequency (Times/Week): 2, Weekly Exercise (Minutes/Week): 60   Goals Addressed             This Visit's Progress    Patient Stated       06/26/2022, maintain good blood sugars       Depression Screen    06/26/2022    8:16 AM 10/14/2021    1:30 PM 09/05/2021    8:19 AM 06/02/2021    1:36 PM 05/28/2021    8:51 AM 05/28/2021    8:48 AM 03/12/2021    5:30 PM  PHQ 2/9 Scores  PHQ - 2 Score 0 0 0 0 0 0 0  PHQ- 9 Score  0 0 0       Fall Risk    06/23/2022    1:43 PM 05/31/2022   10:48 AM 04/14/2022    9:53 AM 10/14/2021    1:06 PM 09/05/2021    8:20 AM  Fall Risk   Falls in the past year? 0 0 0 0 0  Number falls in past yr: 0  0 0 0  Injury with Fall? 0 0 0 0 0  Risk for fall due to : Medication side effect    No Fall Risks  Follow up Falls prevention discussed;Education provided;Falls evaluation completed    Falls evaluation completed    FALL RISK PREVENTION PERTAINING TO THE HOME:  Any stairs in or around the home? No  If so, are there any without handrails? N/a Home free of loose throw rugs in walkways, pet beds, electrical cords, etc? Yes  Adequate lighting in your home to reduce risk of falls? Yes   ASSISTIVE DEVICES UTILIZED TO PREVENT FALLS:  Life alert? No  Use of a cane, walker or w/c? No  Grab bars in the bathroom? Yes  Shower chair or bench in shower? No  Elevated toilet seat or a handicapped toilet? Yes   TIMED UP AND GO:  Was the test  performed? No .      Cognitive Function:      04/24/2021    8:04 AM  Montreal Cognitive Assessment   Visuospatial/ Executive (0/5) 4  Naming (0/3) 3  Attention: Read list of digits (0/2) 2  Attention: Read list of letters (0/1) 1  Attention: Serial 7 subtraction starting at 100 (0/3) 1  Language: Repeat phrase (0/2) 2  Language : Fluency (0/1) 1  Abstraction (0/2) 2  Delayed Recall (0/5) 0  Orientation (0/6) 6  Total 22      06/26/2022    8:17 AM  6CIT Screen  What Year? 0 points  What month? 0 points  What time? 0 points  Count back from 20 0 points  Months in reverse 4 points  Repeat phrase 0 points  Total Score 4 points    Immunizations Immunization History  Administered Date(s) Administered   Fluad Quad(high Dose 65+) 02/21/2021   PFIZER(Purple Top)SARS-COV-2 Vaccination 09/18/2019, 10/10/2019   Pneumococcal Polysaccharide-23 02/21/2021    TDAP status: Up to date  Flu Vaccine status: Up to date  Pneumococcal vaccine status: Up to date  Covid-19 vaccine status: Completed vaccines  Qualifies for Shingles Vaccine? Yes   Zostavax completed No   Shingrix Completed?: Yes  Screening Tests Health Maintenance  Topic Date Due   DTaP/Tdap/Td (1 - Tdap) Never done   Pneumonia Vaccine 57+ Years old (2 of 2 - PCV) 02/21/2022   FOOT EXAM  03/12/2022   Medicare Annual Wellness (AWV)  05/29/2022   Colonoscopy  10/15/2022 (Originally 11/28/2020)   OPHTHALMOLOGY EXAM  07/08/2022   INFLUENZA VACCINE  09/03/2022   HEMOGLOBIN A1C  10/15/2022   Diabetic kidney evaluation - eGFR measurement  04/14/2023   Diabetic kidney evaluation - Urine ACR  04/14/2023   Hepatitis C Screening  Completed   HPV VACCINES  Aged Out   COVID-19 Vaccine  Discontinued   Zoster Vaccines- Shingrix  Discontinued    Health Maintenance  Health Maintenance Due  Topic Date Due   DTaP/Tdap/Td (1 - Tdap) Never done   Pneumonia Vaccine 63+ Years old (2 of 2 - PCV) 02/21/2022   FOOT EXAM   03/12/2022   Medicare Annual Wellness (AWV)  05/29/2022    Colorectal cancer screening: Type of screening: Colonoscopy. Completed 11/29/2019. Repeat every 3 years  Lung Cancer Screening: (Low Dose CT Chest recommended if Age 73-80 years, 30 pack-year currently smoking OR have quit w/in 15years.) does not qualify.   Lung Cancer Screening Referral: no  Additional Screening:  Hepatitis C Screening: does qualify; Completed 05/01/2019  Vision Screening: Recommended annual ophthalmology exams for early detection of glaucoma and other disorders of the eye. Is the patient up to date with their annual eye exam?  Yes  Who is the provider or what is the name of the office in which the patient attends annual eye exams? Dr. Vanessa Barbara If pt is not established with a provider, would they like to be referred to a provider to establish care? No .   Dental Screening: Recommended annual dental exams for proper oral hygiene  Community Resource Referral / Chronic Care Management: CRR required this visit?  No   CCM required this visit?  No      Plan:     I have personally reviewed and noted the following in the patient's chart:   Medical and social history Use of alcohol, tobacco or illicit drugs  Current medications and supplements including opioid prescriptions. Patient is not currently taking opioid prescriptions. Functional ability and status Nutritional status Physical activity Advanced directives List of other physicians Hospitalizations, surgeries, and ER visits in previous 12 months Vitals Screenings to include cognitive, depression, and falls Referrals and appointments  In addition, I have reviewed and discussed with patient certain preventive protocols, quality metrics, and best practice recommendations. A written personalized care plan for preventive services as well as general preventive health recommendations were provided to patient.     Barb Merino, LPN   1/61/0960    Nurse Notes: none  Due to this being a virtual visit, the after visit summary with patients personalized plan was offered to patient via mail or my-chart. Patient would like to access on my-chart

## 2022-06-26 NOTE — Patient Instructions (Signed)
Joe Stewart , Thank you for taking time to come for your Medicare Wellness Visit. I appreciate your ongoing commitment to your health goals. Please review the following plan we discussed and let me know if I can assist you in the future.   These are the goals we discussed:  Goals      Patient Stated     06/26/2022, maintain good blood sugars        This is a list of the screening recommended for you and due dates:  Health Maintenance  Topic Date Due   DTaP/Tdap/Td vaccine (1 - Tdap) Never done   Pneumonia Vaccine (2 of 2 - PCV) 02/21/2022   Complete foot exam   03/12/2022   Colon Cancer Screening  10/15/2022*   Eye exam for diabetics  07/08/2022   Flu Shot  09/03/2022   Hemoglobin A1C  10/15/2022   Yearly kidney function blood test for diabetes  04/14/2023   Yearly kidney health urinalysis for diabetes  04/14/2023   Medicare Annual Wellness Visit  06/26/2023   Hepatitis C Screening  Completed   HPV Vaccine  Aged Out   COVID-19 Vaccine  Discontinued   Zoster (Shingles) Vaccine  Discontinued  *Topic was postponed. The date shown is not the original due date.    Advanced directives: Advance directive discussed with you today.   Conditions/risks identified: none  Next appointment: Follow up in one year for your annual wellness visit.   Preventive Care 77 Years and Older, Male  Preventive care refers to lifestyle choices and visits with your health care provider that can promote health and wellness. What does preventive care include? A yearly physical exam. This is also called an annual well check. Dental exams once or twice a year. Routine eye exams. Ask your health care provider how often you should have your eyes checked. Personal lifestyle choices, including: Daily care of your teeth and gums. Regular physical activity. Eating a healthy diet. Avoiding tobacco and drug use. Limiting alcohol use. Practicing safe sex. Taking low doses of aspirin every day. Taking  vitamin and mineral supplements as recommended by your health care provider. What happens during an annual well check? The services and screenings done by your health care provider during your annual well check will depend on your age, overall health, lifestyle risk factors, and family history of disease. Counseling  Your health care provider may ask you questions about your: Alcohol use. Tobacco use. Drug use. Emotional well-being. Home and relationship well-being. Sexual activity. Eating habits. History of falls. Memory and ability to understand (cognition). Work and work Astronomer. Screening  You may have the following tests or measurements: Height, weight, and BMI. Blood pressure. Lipid and cholesterol levels. These may be checked every 5 years, or more frequently if you are over 91 years old. Skin check. Lung cancer screening. You may have this screening every year starting at age 1 if you have a 30-pack-year history of smoking and currently smoke or have quit within the past 15 years. Fecal occult blood test (FOBT) of the stool. You may have this test every year starting at age 4. Flexible sigmoidoscopy or colonoscopy. You may have a sigmoidoscopy every 5 years or a colonoscopy every 10 years starting at age 94. Prostate cancer screening. Recommendations will vary depending on your family history and other risks. Hepatitis C blood test. Hepatitis B blood test. Sexually transmitted disease (STD) testing. Diabetes screening. This is done by checking your blood sugar (glucose) after you have not eaten  for a while (fasting). You may have this done every 1-3 years. Abdominal aortic aneurysm (AAA) screening. You may need this if you are a current or former smoker. Osteoporosis. You may be screened starting at age 44 if you are at high risk. Talk with your health care provider about your test results, treatment options, and if necessary, the need for more tests. Vaccines  Your  health care provider may recommend certain vaccines, such as: Influenza vaccine. This is recommended every year. Tetanus, diphtheria, and acellular pertussis (Tdap, Td) vaccine. You may need a Td booster every 10 years. Zoster vaccine. You may need this after age 35. Pneumococcal 13-valent conjugate (PCV13) vaccine. One dose is recommended after age 79. Pneumococcal polysaccharide (PPSV23) vaccine. One dose is recommended after age 28. Talk to your health care provider about which screenings and vaccines you need and how often you need them. This information is not intended to replace advice given to you by your health care provider. Make sure you discuss any questions you have with your health care provider. Document Released: 02/15/2015 Document Revised: 10/09/2015 Document Reviewed: 11/20/2014 Elsevier Interactive Patient Education  2017 [employer].  Fall Prevention in the Home Falls can cause injuries. They can happen to people of all ages. There are many things you can do to make your home safe and to help prevent falls. What can I do on the outside of my home? Regularly fix the edges of walkways and driveways and fix any cracks. Remove anything that might make you trip as you walk through a door, such as a raised step or threshold. Trim any bushes or trees on the path to your home. Use bright outdoor lighting. Clear any walking paths of anything that might make someone trip, such as rocks or tools. Regularly check to see if handrails are loose or broken. Make sure that both sides of any steps have handrails. Any raised decks and porches should have guardrails on the edges. Have any leaves, snow, or ice cleared regularly. Use sand or salt on walking paths during winter. Clean up any spills in your garage right away. This includes oil or grease spills. What can I do in the bathroom? Use night lights. Install grab bars by the toilet and in the tub and shower. Do not use towel bars as  grab bars. Use non-skid mats or decals in the tub or shower. If you need to sit down in the shower, use a plastic, non-slip stool. Keep the floor dry. Clean up any water that spills on the floor as soon as it happens. Remove soap buildup in the tub or shower regularly. Attach bath mats securely with double-sided non-slip rug tape. Do not have throw rugs and other things on the floor that can make you trip. What can I do in the bedroom? Use night lights. Make sure that you have a light by your bed that is easy to reach. Do not use any sheets or blankets that are too big for your bed. They should not hang down onto the floor. Have a firm chair that has side arms. You can use this for support while you get dressed. Do not have throw rugs and other things on the floor that can make you trip. What can I do in the kitchen? Clean up any spills right away. Avoid walking on wet floors. Keep items that you use a lot in easy-to-reach places. If you need to reach something above you, use a strong step stool that has  a grab bar. Keep electrical cords out of the way. Do not use floor polish or wax that makes floors slippery. If you must use wax, use non-skid floor wax. Do not have throw rugs and other things on the floor that can make you trip. What can I do with my stairs? Do not leave any items on the stairs. Make sure that there are handrails on both sides of the stairs and use them. Fix handrails that are broken or loose. Make sure that handrails are as long as the stairways. Check any carpeting to make sure that it is firmly attached to the stairs. Fix any carpet that is loose or worn. Avoid having throw rugs at the top or bottom of the stairs. If you do have throw rugs, attach them to the floor with carpet tape. Make sure that you have a light switch at the top of the stairs and the bottom of the stairs. If you do not have them, ask someone to add them for you. What else can I do to help prevent  falls? Wear shoes that: Do not have high heels. Have rubber bottoms. Are comfortable and fit you well. Are closed at the toe. Do not wear sandals. If you use a stepladder: Make sure that it is fully opened. Do not climb a closed stepladder. Make sure that both sides of the stepladder are locked into place. Ask someone to hold it for you, if possible. Clearly mark and make sure that you can see: Any grab bars or handrails. First and last steps. Where the edge of each step is. Use tools that help you move around (mobility aids) if they are needed. These include: Canes. Walkers. Scooters. Crutches. Turn on the lights when you go into a dark area. Replace any light bulbs as soon as they burn out. Set up your furniture so you have a clear path. Avoid moving your furniture around. If any of your floors are uneven, fix them. If there are any pets around you, be aware of where they are. Review your medicines with your doctor. Some medicines can make you feel dizzy. This can increase your chance of falling. Ask your doctor what other things that you can do to help prevent falls. This information is not intended to replace advice given to you by your health care provider. Make sure you discuss any questions you have with your health care provider. Document Released: 11/15/2008 Document Revised: 06/27/2015 Document Reviewed: 02/23/2014 Elsevier Interactive Patient Education  2017 [employer].

## 2022-06-30 ENCOUNTER — Ambulatory Visit (INDEPENDENT_AMBULATORY_CARE_PROVIDER_SITE_OTHER): Payer: Medicare Other

## 2022-06-30 DIAGNOSIS — I639 Cerebral infarction, unspecified: Secondary | ICD-10-CM

## 2022-06-30 LAB — CUP PACEART REMOTE DEVICE CHECK
Date Time Interrogation Session: 20240525230132
Implantable Pulse Generator Implant Date: 20230407

## 2022-07-06 ENCOUNTER — Ambulatory Visit (INDEPENDENT_AMBULATORY_CARE_PROVIDER_SITE_OTHER): Payer: Medicare Other | Admitting: Ophthalmology

## 2022-07-06 ENCOUNTER — Encounter (INDEPENDENT_AMBULATORY_CARE_PROVIDER_SITE_OTHER): Payer: Self-pay | Admitting: Ophthalmology

## 2022-07-06 DIAGNOSIS — Z961 Presence of intraocular lens: Secondary | ICD-10-CM

## 2022-07-06 DIAGNOSIS — E113313 Type 2 diabetes mellitus with moderate nonproliferative diabetic retinopathy with macular edema, bilateral: Secondary | ICD-10-CM | POA: Diagnosis not present

## 2022-07-06 DIAGNOSIS — H547 Unspecified visual loss: Secondary | ICD-10-CM

## 2022-07-06 DIAGNOSIS — I639 Cerebral infarction, unspecified: Secondary | ICD-10-CM

## 2022-07-06 DIAGNOSIS — Z7984 Long term (current) use of oral hypoglycemic drugs: Secondary | ICD-10-CM | POA: Diagnosis not present

## 2022-07-06 DIAGNOSIS — I10 Essential (primary) hypertension: Secondary | ICD-10-CM

## 2022-07-06 DIAGNOSIS — H35033 Hypertensive retinopathy, bilateral: Secondary | ICD-10-CM

## 2022-07-06 MED ORDER — AFLIBERCEPT 2MG/0.05ML IZ SOLN FOR KALEIDOSCOPE
2.0000 mg | INTRAVITREAL | Status: AC | PRN
Start: 2022-07-06 — End: 2022-07-06
  Administered 2022-07-06: 2 mg via INTRAVITREAL

## 2022-07-09 ENCOUNTER — Other Ambulatory Visit: Payer: Self-pay | Admitting: Family Medicine

## 2022-07-09 DIAGNOSIS — E1159 Type 2 diabetes mellitus with other circulatory complications: Secondary | ICD-10-CM

## 2022-07-09 DIAGNOSIS — N1831 Chronic kidney disease, stage 3a: Secondary | ICD-10-CM

## 2022-07-10 ENCOUNTER — Other Ambulatory Visit: Payer: Self-pay | Admitting: Family Medicine

## 2022-07-10 DIAGNOSIS — N1831 Chronic kidney disease, stage 3a: Secondary | ICD-10-CM

## 2022-07-13 ENCOUNTER — Telehealth: Payer: Self-pay | Admitting: Family Medicine

## 2022-07-13 DIAGNOSIS — E119 Type 2 diabetes mellitus without complications: Secondary | ICD-10-CM

## 2022-07-13 DIAGNOSIS — I10 Essential (primary) hypertension: Secondary | ICD-10-CM

## 2022-07-13 MED ORDER — METOPROLOL SUCCINATE ER 50 MG PO TB24
50.0000 mg | ORAL_TABLET | Freq: Every day | ORAL | 3 refills | Status: DC
Start: 2022-07-13 — End: 2023-06-29

## 2022-07-13 NOTE — Telephone Encounter (Signed)
Spoke with patient and he stated he just needs refill on metoprolol. Chart supports rx. Last OV: 06/26/2022 Next OV: 10/15/2022

## 2022-07-13 NOTE — Telephone Encounter (Signed)
Pt stated that the pharmacy said he needs refills and approval for  metoprolol succinate (TOPROL-XL) 50 MG 24 hr tablet [[id number]] There are other meds also but he did not have the list with him.

## 2022-07-27 NOTE — Progress Notes (Signed)
[facility] Summary Report / Loop Recorder 

## 2022-07-31 LAB — CUP PACEART REMOTE DEVICE CHECK
Date Time Interrogation Session: 20240627230326
Implantable Pulse Generator Implant Date: 20230407

## 2022-08-03 ENCOUNTER — Encounter (INDEPENDENT_AMBULATORY_CARE_PROVIDER_SITE_OTHER): Payer: Medicare Other | Admitting: Ophthalmology

## 2022-08-03 ENCOUNTER — Encounter (INDEPENDENT_AMBULATORY_CARE_PROVIDER_SITE_OTHER): Payer: Self-pay

## 2022-08-03 ENCOUNTER — Ambulatory Visit (INDEPENDENT_AMBULATORY_CARE_PROVIDER_SITE_OTHER): Payer: Medicare Other

## 2022-08-03 DIAGNOSIS — H35033 Hypertensive retinopathy, bilateral: Secondary | ICD-10-CM

## 2022-08-03 DIAGNOSIS — Z7984 Long term (current) use of oral hypoglycemic drugs: Secondary | ICD-10-CM

## 2022-08-03 DIAGNOSIS — H547 Unspecified visual loss: Secondary | ICD-10-CM

## 2022-08-03 DIAGNOSIS — I639 Cerebral infarction, unspecified: Secondary | ICD-10-CM | POA: Diagnosis not present

## 2022-08-03 DIAGNOSIS — I10 Essential (primary) hypertension: Secondary | ICD-10-CM

## 2022-08-03 DIAGNOSIS — E113313 Type 2 diabetes mellitus with moderate nonproliferative diabetic retinopathy with macular edema, bilateral: Secondary | ICD-10-CM

## 2022-08-03 DIAGNOSIS — Z961 Presence of intraocular lens: Secondary | ICD-10-CM

## 2022-08-05 ENCOUNTER — Other Ambulatory Visit: Payer: Self-pay | Admitting: Family

## 2022-08-05 ENCOUNTER — Other Ambulatory Visit: Payer: Self-pay | Admitting: Family Medicine

## 2022-08-05 DIAGNOSIS — I10 Essential (primary) hypertension: Secondary | ICD-10-CM

## 2022-08-05 NOTE — Telephone Encounter (Signed)
Chart supports rx. Last OV: 06/26/2022 Next OV: 10/15/2022

## 2022-08-10 ENCOUNTER — Telehealth: Payer: Self-pay | Admitting: Family Medicine

## 2022-08-10 DIAGNOSIS — I10 Essential (primary) hypertension: Secondary | ICD-10-CM

## 2022-08-10 MED ORDER — AMLODIPINE BESYLATE 10 MG PO TABS
10.0000 mg | ORAL_TABLET | Freq: Every day | ORAL | 0 refills | Status: DC
Start: 2022-08-10 — End: 2022-09-17

## 2022-08-10 NOTE — Telephone Encounter (Signed)
Prescription Request  08/10/2022  LOV: 04/14/2022  What is the name of the medication or equipment? amLODipine (NORVASC) 10 MG tablet   Have you contacted your pharmacy to request a refill? No   Which pharmacy would you like this sent to?  [id number] [place], [place] - [address] AT [facility] [address] [place] [address] Phone: [phone] Fax: [phone]    Patient notified that their request is being sent to the clinical staff for review and that they should receive a response within 2 business days.   Please advise at Mobile [phone] (mobile)

## 2022-08-10 NOTE — Telephone Encounter (Signed)
Chart supports rx. Last OV: 04/14/2022 Next OV: 10/15/2022

## 2022-08-24 NOTE — Progress Notes (Signed)
[facility] Summary Report / Loop Recorder 

## 2022-08-24 NOTE — Progress Notes (Signed)
[facility] - Clinic Note  08/26/2022    CHIEF COMPLAINT Patient presents for Retina Follow Up   HISTORY OF PRESENT ILLNESS: Joe Stewart is a 67 y.o. male who presents to the clinic today for:  HPI     Retina Follow Up   Patient presents with  Diabetic Retinopathy.  In both eyes.  This started months ago.  Duration of 4 weeks.  Since onset it is stable.  I, the attending physician,  performed the HPI with the patient and updated documentation appropriately.        Comments   Patient feels the vision is a little better in the right and and about the same in the left eye. He is not using eye drops. His blood sugar was 118.      Last edited by Rennis Chris, MD on 08/26/2022  9:51 PM.    Pt is delayed to follow up from 4 weeks to 7 weeks due to illness   Referring physician: Alma Downs, PA [address] [place], [place] [address]  HISTORICAL INFORMATION:   Selected notes from the MEDICAL RECORD NUMBER Referred by Mathis Fare Lunduist, PA LEE: 09.15.21 BCVA OD: 20/20 OS: 20/20 Ocular Hx- NPDR with edema  PMH- DM, HTN   CURRENT MEDICATIONS: No current outpatient medications on file. (Ophthalmic Drugs)   No current facility-administered medications for this visit. (Ophthalmic Drugs)   Current Outpatient Medications (Other)  Medication Sig   amLODipine (NORVASC) 10 MG tablet Take 1 tablet (10 mg total) by mouth daily.   apixaban (ELIQUIS) 5 MG TABS tablet Take 1 tablet (5 mg total) by mouth 2 (two) times daily.   atorvastatin (LIPITOR) 80 MG tablet TAKE 1 TABLET BY MOUTH EVERY DAY   blood glucose meter kit and supplies KIT Dispense based on patient and insurance preference. Use up to four times daily as directed.   glipiZIDE (GLUCOTROL) 5 MG tablet TAKE 1 TABLET (5 MG TOTAL) BY MOUTH TWICE A DAY BEFORE MEALS   hydrochlorothiazide (HYDRODIURIL) 25 MG tablet Take 1 tablet (25 mg total) by mouth daily.   JARDIANCE 25 MG TABS tablet TAKE 1 TABLET (25  MG TOTAL) BY MOUTH DAILY.   losartan (COZAAR) 100 MG tablet TAKE 1 TABLET BY MOUTH EVERY DAY   metFORMIN (GLUCOPHAGE) 500 MG tablet Take 1 tablet (500 mg total) by mouth 2 (two) times daily with a meal.   metoprolol succinate (TOPROL-XL) 50 MG 24 hr tablet Take 1 tablet (50 mg total) by mouth daily. Take with or immediately following a meal.   No current facility-administered medications for this visit. (Other)   REVIEW OF SYSTEMS:   ALLERGIES No Known Allergies  PAST MEDICAL HISTORY Past Medical History:  Diagnosis Date   Arthritis    Cataract    Mixed OU   Diabetes mellitus without complication (HCC)    Diabetic retinopathy (HCC)    NPDR OU   Hyperlipidemia    Hypertension    Hypertensive retinopathy    OU   Obesity    Stroke [facility])    Past Surgical History:  Procedure Laterality Date   BIOPSY  11/29/2019   Procedure: BIOPSY;  Surgeon: Lemar Lofty., MD;  Location: [facility];  Service: Gastroenterology;;   COLONOSCOPY WITH PROPOFOL N/A 11/29/2019   Procedure: COLONOSCOPY WITH PROPOFOL;  Surgeon: Lemar Lofty., MD;  Location: [facility];  Service: Gastroenterology;  Laterality: N/A;   ENDOSCOPIC MUCOSAL RESECTION N/A 11/29/2019   Procedure: ENDOSCOPIC MUCOSAL RESECTION;  Surgeon: Lemar Lofty., MD;  Location: [facility];  Service: Gastroenterology;  Laterality: N/A;   ESOPHAGOGASTRODUODENOSCOPY (EGD) WITH PROPOFOL N/A 11/29/2019   Procedure: ESOPHAGOGASTRODUODENOSCOPY (EGD) WITH PROPOFOL;  Surgeon: Meridee Score Netty Starring., MD;  Location: [facility];  Service: Gastroenterology;  Laterality: N/A;   EYE SURGERY Left    had to have eye flushed after getting costic sodium in L eye   HEMOSTASIS CLIP PLACEMENT  11/29/2019   Procedure: HEMOSTASIS CLIP PLACEMENT;  Surgeon: Lemar Lofty., MD;  Location: [facility];  Service: Gastroenterology;;   HERNIA REPAIR     HERNIA REPAIR     35 years ago   HOT HEMOSTASIS N/A 11/29/2019    Procedure: HOT HEMOSTASIS (ARGON PLASMA COAGULATION/BICAP);  Surgeon: Lemar Lofty., MD;  Location: [facility];  Service: Gastroenterology;  Laterality: N/A;   JOINT REPLACEMENT  2021   Left knee replacement   LEFT HEART CATH AND CORONARY ANGIOGRAPHY N/A 04/11/2019   Procedure: LEFT HEART CATH AND CORONARY ANGIOGRAPHY;  Surgeon: Yates Decamp, MD;  Location: [facility];  Service: Cardiovascular;  Laterality: N/A;   POLYPECTOMY  11/29/2019   Procedure: POLYPECTOMY;  Surgeon: Meridee Score Netty Starring., MD;  Location: [facility];  Service: Gastroenterology;;   RENAL ANGIOGRAPHY Bilateral 04/11/2019   Procedure: RENAL ANGIOGRAPHY;  Surgeon: Yates Decamp, MD;  Location: [facility] INVASIVE CV LAB;  Service: Cardiovascular;  Laterality: Bilateral;   SUBMUCOSAL LIFTING INJECTION  11/29/2019   Procedure: SUBMUCOSAL LIFTING INJECTION;  Surgeon: Lemar Lofty., MD;  Location: [facility];  Service: Gastroenterology;;   TOTAL KNEE ARTHROPLASTY Left 08/01/2019   Procedure: LEFT TOTAL KNEE ARTHROPLASTY;  Surgeon: Cammy Copa, MD;  Location: [facility] OR;  Service: Orthopedics;  Laterality: Left;   FAMILY HISTORY Family History  Problem Relation Age of Onset   Diabetes Mother    COPD Mother    Cancer Mother        unknown type    Heart disease Father    COPD Sister    Healthy Brother    Heart disease Brother    Healthy Son    Colon cancer Neg Hx    Colon polyps Neg Hx    Esophageal cancer Neg Hx    Rectal cancer Neg Hx    Stomach cancer Neg Hx    Inflammatory bowel disease Neg Hx    Liver disease Neg Hx    Pancreatic cancer Neg Hx    SOCIAL HISTORY Social History   Tobacco Use   Smoking status: Never   Smokeless tobacco: Never  Vaping Use   Vaping status: Never Used  Substance Use Topics   Alcohol use: Not Currently   Drug use: Never       OPHTHALMIC EXAM:  Base Eye Exam     Visual Acuity (Snellen - Linear)       Right Left   Dist [place] 20/25 +2 20/30   Dist  ph [place] NI NI         Tonometry (Tonopen, 8:17 AM)       Right Left   Pressure 12 13         Pupils       Dark Light Shape React APD   Right 3 2 Round Brisk None   Left 3 2 Round Brisk None         Visual Fields       Left Right   Restrictions Partial outer superior nasal deficiency Total superior temporal deficiency  Extraocular Movement       Right Left    Full, Ortho Full, Ortho         Neuro/Psych     Oriented x3: Yes   Mood/Affect: Normal         Dilation     Both eyes: 1.0% Mydriacyl, 2.5% Phenylephrine @ 8:14 AM           Slit Lamp and Fundus Exam     Slit Lamp Exam       Right Left   Lids/Lashes Dermatochalasis - upper lid, Meibomian gland dysfunction Dermatochalasis - upper lid, Meibomian gland dysfunction   Conjunctiva/Sclera White and quiet White and quiet   Cornea 1+Punctate epithelial erosions, well healed cataract wound 1+ Punctate epithelial erosions, well healed cataract wound   Anterior Chamber Deep and quiet, deep, clear, narrow temporal angle Deep and quiet   Iris Round and moderately dilated, No NVI Round and moderately dilated to 6.102mm, No NVI, PPM nasal   Lens PC IOL in good position, trace Posterior capsular opacification PC IOL in good position, trace Posterior capsular opacification   Anterior Vitreous Vitreous syneresis, mild vitreous condensations Vitreous syneresis         Fundus Exam       Right Left   Disc mild Pallor, Sharp rim, mild PPA Pink and Sharp, +Peripapillary atrophy, Compact   C/D Ratio 0.2 0.1   Macula Flat, Blunted foveal reflex, scattered MA/IRH -- improved, RPE mottling and clumping, trace cystic changes centrally -- slightly improved Flat, Blunted foveal reflex, rare MA, focal cluster of cystic changes superior macula -- slightly improved, no exudates   Vessels attenuated, Tortuous attenuated, Tortuous   Periphery Attached, pigmented cystoid degeneration ST periphery, scattered MA/IRH  greatest posteriorly, focal pigmented VR tuft at 0800 Attached, scattered MA greatest posteriorly, focal pigmented CR scarring along distal ST arcades (0130)            IMAGING AND PROCEDURES  Imaging and Procedures for 08/26/2022  OCT, Retina - OU - Both Eyes       Right Eye Quality was good. Central Foveal Thickness: 339. Progression has improved. Findings include normal foveal contour, no SRF, intraretinal hyper-reflective material, intraretinal fluid (Interval improvement in central cyst, mild interval increase in cystic changes temporal mac, very shallow schisis ST periphery caught on widefield--not imaged today).   Left Eye Quality was good. Central Foveal Thickness: 325. Progression has improved. Findings include normal foveal contour, no SRF, intraretinal hyper-reflective material, intraretinal fluid, vitreomacular adhesion (Persistent IRF/central edema greatest superior macula and fovea -- slightly improved).   Notes *Images captured and stored on drive  Diagnosis / Impression:  DME OU OD: Interval improvement in central cyst, mild interval increase in cystic changes temporal mac, very shallow schisis ST periphery caught on widefield--not imaged today OS: Persistent IRF/central edema greatest superior macula and fovea -- slightly improved  Clinical management:  See below  Abbreviations: NFP - Normal foveal profile. CME - cystoid macular edema. PED - pigment epithelial detachment. IRF - intraretinal fluid. SRF - subretinal fluid. EZ - ellipsoid zone. ERM - epiretinal membrane. ORA - outer retinal atrophy. ORT - outer retinal tubulation. SRHM - subretinal hyper-reflective material. IRHM - intraretinal hyper-reflective material      Intravitreal Injection, Pharmacologic Agent - OD - Right Eye       Time Out 08/26/2022. 9:03 AM. Confirmed correct patient, procedure, site, and patient consented.   Anesthesia Topical anesthesia was used. Anesthetic medications included  Lidocaine 2%, Proparacaine 0.5%.  Procedure Preparation included 5% betadine to ocular surface, eyelid speculum. A (32g) needle was used.   Injection: 2 mg aflibercept 2 MG/0.05ML   Route: Intravitreal, Site: Right Eye   NDC: L6038910, Lot: [id number], Expiration date: 08/02/2023, Waste: 0 mL   Post-op Post injection exam found visual acuity of at least counting fingers. The patient tolerated the procedure well. There were no complications. The patient received written and verbal post procedure care education. Post injection medications were not given.      Intravitreal Injection, Pharmacologic Agent - OS - Left Eye       Time Out 08/26/2022. 9:04 AM. Confirmed correct patient, procedure, site, and patient consented.   Anesthesia Topical anesthesia was used. Anesthetic medications included Lidocaine 2%, Proparacaine 0.5%.   Procedure Preparation included 5% betadine to ocular surface, eyelid speculum. A (32g) needle was used.   Injection: 2 mg aflibercept 2 MG/0.05ML   Route: Intravitreal, Site: Left Eye   NDC: L6038910, Lot: [id number], Expiration date: 10/03/2023, Waste: 0 mL   Post-op Post injection exam found visual acuity of at least counting fingers. The patient tolerated the procedure well. There were no complications. The patient received written and verbal post procedure care education.            ASSESSMENT/PLAN:    ICD-10-CM   1. Acute CVA (cerebrovascular accident) (HCC)  I63.9     2. Visual field loss following cerebrovascular accident  I69.398    H54.7     3. Moderate nonproliferative diabetic retinopathy of both eyes with macular edema associated with type 2 diabetes mellitus (HCC)  E11.3313 OCT, Retina - OU - Both Eyes    Intravitreal Injection, Pharmacologic Agent - OD - Right Eye    Intravitreal Injection, Pharmacologic Agent - OS - Left Eye    aflibercept (EYLEA) SOLN 2 mg    aflibercept (EYLEA) SOLN 2 mg    4. Long term (current) use of  oral hypoglycemic drugs  Z79.84     5. Essential hypertension  I10     6. Hypertensive retinopathy of both eyes  H35.033     7. Pseudophakia, both eyes  Z96.1     8. Diabetes mellitus treated with oral medication (HCC)  E11.9    Z79.84      1,2. 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  - BCVA was improved to 20/25 OU post cataract sx, today -- 20/25 OD, 20/30 OS  - baseline visual field obtained, 04.24.23 -- right homonymous superior quadrantanopia consistent w/ L PCA stroke  - monitor  3,4. Moderate Non-proliferative diabetic retinopathy w/ DME OU  - delayed f/u -- 7 wks instead of 4 due to illness  - pt lost to f/u from Mar 2022 to Feb 2023 (11 mos)  - S/P IVA OD #1 (10.26.21), #2 (11.24.21), #3 (12.22.21), #4 (03/06/20), #5 (03.23.22)  - S/P IVA OS #1 (03.23.22), #2 (01.05.24), #3 (02.16.24), #4 (03.29.24), #5 (05.06.24)  - s/p IVE OU #1 (06.03.24)             - A1c 7.3% on  03.12.24; 6.9% on 12.12.23; Max 12.3% on 01.19.23 - BCVA OD: 20/25, OS: 20/30 - exam shows scattered MA and mild edema/cystic changes OU -- improving - FA (09.29.21) shows late leaking MA OU; no NV OU  -  OCT shows OD: Interval improvement in central cyst, mild interval increase in cystic changes temporal mac; OS: Persistent IRF/central edema greatest superior macula and fovea -- slightly improved at 7 wks - recommend IVE OU #2 today, 07.24.24 with follow up in 4 weeks  - pt wishes to proceed with injection  - RBA of procedure discussed, questions answered - IVE informed consent obtained and signed, 06.03.24 (OS) - see procedure note  - f/u 4 weeks - DFE, OCT, possible injxn(s)  5,6. Hypertensive retinopathy OU - discussed importance of tight BP control -  monitor  7. Pseudophakia OU  - s/p CE/IOL (Dr. Laruth Bouchard, ~August 2023)  - IOLs in good position, doing well  - monitor  Ophthalmic Meds Ordered this visit:  Meds ordered this encounter  Medications   aflibercept (EYLEA) SOLN 2 mg   aflibercept (EYLEA) SOLN 2 mg    Return in about 4 weeks (around 09/23/2022) for f/u NPDR OU, DFE, OCT.  There are no Patient Instructions on file for this visit.  This document serves as a record of services personally performed by Karie Chimera, MD, PhD. It was created on their behalf by De Blanch, an ophthalmic technician. The creation of this record is the provider's dictation and/or activities during the visit.    Electronically signed by: De Blanch, OA, 08/26/22  9:52 PM  This document serves as a record of services personally performed by Karie Chimera, MD, PhD. It was created on their behalf by Glee Arvin. Manson Passey, OA an ophthalmic technician. The creation of this record is the provider's dictation and/or activities during the visit.    Electronically signed by: Glee Arvin. Manson Passey, OA 08/26/22 9:52 PM  Karie Chimera, M.D., Ph.D. Diseases & Surgery of the Retina and Vitreous [facility]  I have reviewed the above documentation for accuracy and completeness, and I agree with the above. Karie Chimera, M.D., Ph.D. 08/26/22 9:57 PM   Abbreviations: M myopia (nearsighted); A astigmatism; H hyperopia (farsighted); P presbyopia; Mrx spectacle prescription;  CTL contact lenses; OD right eye; OS left eye; OU both eyes  XT exotropia; ET esotropia; PEK punctate epithelial keratitis; PEE punctate epithelial erosions; DES dry eye syndrome; MGD meibomian gland dysfunction; ATs artificial tears; PFAT's preservative free artificial tears; NSC nuclear sclerotic cataract; PSC posterior subcapsular cataract; ERM epi-retinal membrane; PVD posterior vitreous detachment; RD retinal detachment; DM diabetes mellitus; DR diabetic  retinopathy; NPDR non-proliferative diabetic retinopathy; PDR proliferative diabetic retinopathy; CSME clinically significant macular edema; DME diabetic macular edema; dbh dot blot hemorrhages; CWS cotton wool spot; POAG primary open angle glaucoma; C/D cup-to-disc ratio; HVF humphrey visual field; GVF goldmann visual field; OCT optical coherence tomography; IOP intraocular pressure; BRVO Branch retinal vein occlusion; CRVO central retinal vein occlusion; CRAO central retinal artery occlusion; BRAO branch retinal artery occlusion; RT retinal tear; SB scleral buckle; PPV pars plana vitrectomy; VH Vitreous hemorrhage; PRP panretinal laser photocoagulation; IVK intravitreal kenalog; VMT vitreomacular traction; MH Macular hole;  NVD neovascularization of the disc; NVE neovascularization elsewhere; AREDS age related eye disease study; ARMD age related macular degeneration; POAG primary open angle glaucoma; EBMD epithelial/anterior basement membrane dystrophy; ACIOL anterior chamber intraocular lens; IOL intraocular lens; PCIOL posterior chamber intraocular lens; Phaco/IOL phacoemulsification with intraocular lens placement; PRK photorefractive keratectomy; LASIK laser assisted in situ keratomileusis; HTN hypertension; DM diabetes mellitus; COPD chronic obstructive pulmonary disease

## 2022-08-26 ENCOUNTER — Ambulatory Visit (INDEPENDENT_AMBULATORY_CARE_PROVIDER_SITE_OTHER): Payer: Medicare Other | Admitting: Ophthalmology

## 2022-08-26 ENCOUNTER — Encounter (INDEPENDENT_AMBULATORY_CARE_PROVIDER_SITE_OTHER): Payer: Self-pay | Admitting: Ophthalmology

## 2022-08-26 DIAGNOSIS — I639 Cerebral infarction, unspecified: Secondary | ICD-10-CM

## 2022-08-26 DIAGNOSIS — H35033 Hypertensive retinopathy, bilateral: Secondary | ICD-10-CM

## 2022-08-26 DIAGNOSIS — Z7984 Long term (current) use of oral hypoglycemic drugs: Secondary | ICD-10-CM | POA: Diagnosis not present

## 2022-08-26 DIAGNOSIS — H547 Unspecified visual loss: Secondary | ICD-10-CM

## 2022-08-26 DIAGNOSIS — E113313 Type 2 diabetes mellitus with moderate nonproliferative diabetic retinopathy with macular edema, bilateral: Secondary | ICD-10-CM | POA: Diagnosis not present

## 2022-08-26 DIAGNOSIS — I10 Essential (primary) hypertension: Secondary | ICD-10-CM | POA: Diagnosis not present

## 2022-08-26 DIAGNOSIS — Z961 Presence of intraocular lens: Secondary | ICD-10-CM

## 2022-08-26 DIAGNOSIS — E119 Type 2 diabetes mellitus without complications: Secondary | ICD-10-CM

## 2022-08-26 MED ORDER — AFLIBERCEPT 2MG/0.05ML IZ SOLN FOR KALEIDOSCOPE
2.0000 mg | INTRAVITREAL | Status: AC | PRN
Start: 2022-08-26 — End: 2022-08-26
  Administered 2022-08-26: 2 mg via INTRAVITREAL

## 2022-09-01 ENCOUNTER — Other Ambulatory Visit: Payer: Self-pay | Admitting: Family Medicine

## 2022-09-01 DIAGNOSIS — E119 Type 2 diabetes mellitus without complications: Secondary | ICD-10-CM

## 2022-09-01 DIAGNOSIS — I10 Essential (primary) hypertension: Secondary | ICD-10-CM

## 2022-09-01 NOTE — Telephone Encounter (Signed)
Chart supports rx. Last OV: 06/26/2022 Next OV: 10/15/2022

## 2022-09-02 ENCOUNTER — Ambulatory Visit (INDEPENDENT_AMBULATORY_CARE_PROVIDER_SITE_OTHER): Payer: Medicare Other

## 2022-09-02 DIAGNOSIS — I639 Cerebral infarction, unspecified: Secondary | ICD-10-CM

## 2022-09-03 ENCOUNTER — Other Ambulatory Visit (HOSPITAL_COMMUNITY): Payer: Self-pay | Admitting: Internal Medicine

## 2022-09-03 ENCOUNTER — Other Ambulatory Visit: Payer: Self-pay | Admitting: Internal Medicine

## 2022-09-03 NOTE — Telephone Encounter (Signed)
This is a [facility]ib clinic pt that was last seen in June 2024. Please address

## 2022-09-07 ENCOUNTER — Ambulatory Visit: Payer: Medicare Other

## 2022-09-08 DIAGNOSIS — I639 Cerebral infarction, unspecified: Secondary | ICD-10-CM

## 2022-09-14 NOTE — Progress Notes (Unsigned)
Cardiology Office Note:    Date:  09/17/2022   ID:  Joe Stewart, DOB 03-May-1955, MRN [id number]  PCP:  Garnette Gunner, MD   [facility] Providers Cardiologist:  Christell Constant, MD     Referring MD: Garnette Gunner, MD   CC: F/u stroke prevention  History of Present Illness:    Joe Stewart is a 66 y.o. male with a hx of Moderate not obstructive CAD, CKD Stage IIIa with DM, HTN and Familial tryglceridemia, prior stroke (Left PCA infarct)Seen by Dr. Tessa Lerner who presents 04/25/21 for care. 2023: s/p ILR.  Negative Lp(a).  LDL 55. 2024: New AF episode. Doing well  Patient notes that he is doing ok.   Since last visit notes [place] a [place] . There are no interval hospital/ED visit.   Had another one six days ago. EKG showed SR 1st HB and RBBB.  No chest pain or pressure .  No SOB/DOE and no PND/Orthopnea.  No weight gain or leg swelling.  No palpitations or syncope.  Asymptomatic of atrial fibrillation.  Has occasional orthostasis.   Ambulatory BP, 106/67    Past Medical History:  Diagnosis Date   Arthritis    Cataract    Mixed OU   Diabetes mellitus without complication (HCC)    Diabetic retinopathy (HCC)    NPDR OU   Hyperlipidemia    Hypertension    Hypertensive retinopathy    OU   Obesity    Stroke [facility])     Past Surgical History:  Procedure Laterality Date   BIOPSY  11/29/2019   Procedure: BIOPSY;  Surgeon: Lemar Lofty., MD;  Location: [facility];  Service: Gastroenterology;;   COLONOSCOPY WITH PROPOFOL N/A 11/29/2019   Procedure: COLONOSCOPY WITH PROPOFOL;  Surgeon: Lemar Lofty., MD;  Location: [facility];  Service: Gastroenterology;  Laterality: N/A;   ENDOSCOPIC MUCOSAL RESECTION N/A 11/29/2019   Procedure: ENDOSCOPIC MUCOSAL RESECTION;  Surgeon: Meridee Score Netty Starring., MD;  Location: [facility];  Service: Gastroenterology;  Laterality: N/A;    ESOPHAGOGASTRODUODENOSCOPY (EGD) WITH PROPOFOL N/A 11/29/2019   Procedure: ESOPHAGOGASTRODUODENOSCOPY (EGD) WITH PROPOFOL;  Surgeon: Meridee Score Netty Starring., MD;  Location: [facility];  Service: Gastroenterology;  Laterality: N/A;   EYE SURGERY Left    had to have eye flushed after getting costic sodium in L eye   HEMOSTASIS CLIP PLACEMENT  11/29/2019   Procedure: HEMOSTASIS CLIP PLACEMENT;  Surgeon: Lemar Lofty., MD;  Location: [facility];  Service: Gastroenterology;;   HERNIA REPAIR     HERNIA REPAIR     35 years ago   HOT HEMOSTASIS N/A 11/29/2019   Procedure: HOT HEMOSTASIS (ARGON PLASMA COAGULATION/BICAP);  Surgeon: Lemar Lofty., MD;  Location: [facility];  Service: Gastroenterology;  Laterality: N/A;   JOINT REPLACEMENT  2021   Left knee replacement   LEFT HEART CATH AND CORONARY ANGIOGRAPHY N/A 04/11/2019   Procedure: LEFT HEART CATH AND CORONARY ANGIOGRAPHY;  Surgeon: Yates Decamp, MD;  Location: [facility]AS[facility] CV [facility];  Service: Cardiovascular;  Laterality: N/A;   POLYPECTOMY  11/29/2019   Procedure: POLYPECTOMY;  Surgeon: Meridee Score Netty Starring., MD;  Location: [facility];  Service: Gastroenterology;;   RENAL ANGIOGRAPHY Bilateral 04/11/2019   Procedure: RENAL ANGIOGRAPHY;  Surgeon: Yates Decamp, MD;  Location: [facility] INVASIVE CV LAB;  Service: Cardiovascular;  Laterality: Bilateral;   SUBMUCOSAL LIFTING INJECTION  11/29/2019   Procedure: SUBMUCOSAL LIFTING INJECTION;  Surgeon: Lemar Lofty., MD;  Location:  [facility];  Service: Gastroenterology;;   TOTAL KNEE ARTHROPLASTY Left 08/01/2019   Procedure: LEFT TOTAL KNEE ARTHROPLASTY;  Surgeon: Cammy Copa, MD;  Location: [facility] OR;  Service: Orthopedics;  Laterality: Left;    Current Medications: Current Meds  Medication Sig   amLODipine (NORVASC) 5 MG tablet Take 1 tablet (5 mg total) by mouth daily.   apixaban (ELIQUIS) 5 MG TABS tablet TAKE 1 TABLET BY MOUTH TWICE A DAY   atorvastatin  (LIPITOR) 80 MG tablet TAKE 1 TABLET BY MOUTH EVERY DAY   blood glucose meter kit and supplies KIT Dispense based on patient and insurance preference. Use up to four times daily as directed.   glipiZIDE (GLUCOTROL) 5 MG tablet TAKE 1 TABLET (5 MG TOTAL) BY MOUTH TWICE A DAY BEFORE MEALS   hydrochlorothiazide (HYDRODIURIL) 25 MG tablet TAKE 1 TABLET (25 MG TOTAL) BY MOUTH DAILY.   JARDIANCE 25 MG TABS tablet TAKE 1 TABLET (25 MG TOTAL) BY MOUTH DAILY.   losartan (COZAAR) 100 MG tablet TAKE 1 TABLET BY MOUTH EVERY DAY   metFORMIN (GLUCOPHAGE) 500 MG tablet Take 1 tablet (500 mg total) by mouth 2 (two) times daily with a meal.   metoprolol succinate (TOPROL-XL) 50 MG 24 hr tablet Take 1 tablet (50 mg total) by mouth daily. Take with or immediately following a meal.   [DISCONTINUED] amLODipine (NORVASC) 10 MG tablet Take 1 tablet (10 mg total) by mouth daily.     Allergies:   Patient has no known allergies.   Social History   Socioeconomic History   Marital status: Single    Spouse name: Not on file   Number of children: 1   Years of education: Not on file   Highest education level: Not on file  Occupational History   Not on file  Tobacco Use   Smoking status: Never   Smokeless tobacco: Never  Vaping Use   Vaping status: Never Used  Substance and Sexual Activity   Alcohol use: Not Currently   Drug use: Never   Sexual activity: Not Currently  Other Topics Concern   Not on file  Social History Narrative   Not on file   Social Determinants of Health   Financial Resource Strain: Low Risk  (06/23/2022)   Overall Financial Resource Strain (CARDIA)    Difficulty of Paying Living Expenses: Not hard at all  Food Insecurity: No Food Insecurity (06/23/2022)   Hunger Vital Sign    Worried About Running Out of Food in the Last Year: Never true    Ran Out of Food in the Last Year: Never true  Transportation Needs: No Transportation Needs (06/23/2022)   PRAPARE - Scientist, research (physical sciences) (Medical): No    Lack of Transportation (Non-Medical): No  Physical Activity: Insufficiently Active (06/23/2022)   Exercise Vital Sign    Days of Exercise per Week: 2 days    Minutes of Exercise per Session: 30 min  Stress: No Stress Concern Present (05/28/2021)   [employer] of Occupational Health - Occupational Stress Questionnaire    Feeling of Stress : Only a little  Social Connections: Unknown (06/23/2022)   Social Connection and Isolation Panel [NHANES]    Frequency of Communication with Friends and Family: More than three times a week    Frequency of Social Gatherings with Friends and Family: Three times a week    Attends Religious Services: Not on file    Active Member of Clubs or Organizations: No  Attends Banker Meetings: Never    Marital Status: Divorced     Family History: The patient's family history includes COPD in his mother and sister; Cancer in his mother; Diabetes in his mother; Healthy in his brother and son; Heart disease in his brother and father. There is no history of Colon cancer, Colon polyps, Esophageal cancer, Rectal cancer, Stomach cancer, Inflammatory bowel disease, Liver disease, or Pancreatic cancer.  ROS:   Please see the history of present illness.     EKGs/Labs/Other Studies Reviewed:    The following studies were reviewed today:  Cardiac Studies & Procedures   CARDIAC CATHETERIZATION  CARDIAC CATHETERIZATION 04/11/2019  Narrative  The left ventricular systolic function is normal.  LV end diastolic pressure is normal.  Left heart catheterization selective renal arteriogram 04/11/2019: Moderate diffuse coronary artery disease with calcification.  Right coronary artery with arteriomegaly and ectasia and slow filling. Left main: Normal. LAD arteriomegaly, ectasia noted, slow filling throughout the LAD.  Diffuse disease. Circumflex: Large vessel, arteriomegaly, diffuse ectasia noted.  Slow filling.  Selective  right and left renal arteriogram: Normal renal arteries.  No evidence of renal artery stenosis.  Impression: Abnormal nuclear stress test related to severe diffuse disease and ectasia of the coronary vessels, markedly reduced blood flow especially in the right coronary artery but also in the circumflex and LAD.  This is related to endothelial dysfunction and microvascular disease along with ectasia contributing to slow flow. Findings suggestive of hypertensive heart disease.  Recommendation: Patient needs aggressive risk factor modification.  If he persist to have any symptoms of angina pectoris, consider low-dose Xarelto 2.5 mg twice daily to improve flow in the coronary arteries empirically.  Could also try dual antiplatelet therapy for a short time.  Findings Coronary Findings Diagnostic  Dominance: Right  Left Anterior Descending There is moderate diffuse disease throughout the vessel.  Left Circumflex There is moderate diffuse disease throughout the vessel.  Right Coronary Artery There is moderate diffuse disease throughout the vessel.  Intervention  No interventions have been documented.   STRESS TESTS  MYOCARDIAL PERFUSION IMAGING 03/27/2019   ECHOCARDIOGRAM  ECHOCARDIOGRAM COMPLETE 02/20/2021  Narrative ECHOCARDIOGRAM REPORT    Patient Name:   HILTON SOFIELD Date of Exam: 02/20/2021 Medical Rec #:  [id number]    Height:       68.0 in Accession #:    [id number]   Weight:       259.0 lb Date of Birth:  Aug 18, 1955     BSA:          2.281 m Patient Age:    65 years     BP:           162/79 mmHg Patient Gender: M            HR:           78 bpm. Exam Location:  Inpatient  Procedure: 2D Echo, Cardiac Doppler and Color Doppler  Indications:    Stroke  History:        Patient has no prior history of Echocardiogram examinations. Signs/Symptoms:Shortness of Breath and Dyspnea; Risk Factors:Hypertension, Diabetes and Dyslipidemia.  Sonographer:    Sheralyn Boatman RDCS Referring  Phys: [id number] [facility]   Sonographer Comments: Technically difficult study due to poor echo windows and patient is morbidly obese. Image acquisition challenging due to patient body habitus. IMPRESSIONS   1. Moderate concentric LVH with proximal septal thickening. 2. Left ventricular ejection fraction, by estimation, is 70 to 75%. The left  ventricle has hyperdynamic function. The left ventricle has no regional wall motion abnormalities. There is moderate concentric left ventricular hypertrophy. Left ventricular diastolic parameters are consistent with Grade I diastolic dysfunction (impaired relaxation). Elevated left atrial pressure. 3. Right ventricular systolic function is normal. The right ventricular size is normal. 4. The mitral valve is normal in structure. No evidence of mitral valve regurgitation. No evidence of mitral stenosis. 5. The aortic valve is tricuspid. Aortic valve regurgitation is not visualized. Aortic valve sclerosis/calcification is present, without any evidence of aortic stenosis. 6. The inferior vena cava is normal in size with greater than 50% respiratory variability, suggesting right atrial pressure of 3 mmHg.  Comparison(s): No prior Echocardiogram.  FINDINGS Left Ventricle: Left ventricular ejection fraction, by estimation, is 70 to 75%. The left ventricle has hyperdynamic function. The left ventricle has no regional wall motion abnormalities. The left ventricular internal cavity size was normal in size. There is moderate concentric left ventricular hypertrophy. Left ventricular diastolic parameters are consistent with Grade I diastolic dysfunction (impaired relaxation). Elevated left atrial pressure.  Right Ventricle: The right ventricular size is normal. Right ventricular systolic function is normal.  Left Atrium: Left atrial size was normal in size.  Right Atrium: Right atrial size was normal in size.  Pericardium: There is no evidence of pericardial  effusion.  Mitral Valve: The mitral valve is normal in structure. Mild mitral annular calcification. No evidence of mitral valve regurgitation. No evidence of mitral valve stenosis.  Tricuspid Valve: The tricuspid valve is normal in structure. Tricuspid valve regurgitation is trivial. No evidence of tricuspid stenosis.  Aortic Valve: The aortic valve is tricuspid. Aortic valve regurgitation is not visualized. Aortic valve sclerosis/calcification is present, without any evidence of aortic stenosis. Aortic valve mean gradient measures 5.0 mmHg. Aortic valve peak gradient measures 8.8 mmHg. Aortic valve area, by VTI measures 3.69 cm.  Pulmonic Valve: The pulmonic valve was normal in structure. Pulmonic valve regurgitation is not visualized. No evidence of pulmonic stenosis.  Aorta: The aortic root is normal in size and structure.  Venous: The inferior vena cava is normal in size with greater than 50% respiratory variability, suggesting right atrial pressure of 3 mmHg.  IAS/Shunts: No atrial level shunt detected by color flow Doppler.  Additional Comments: Moderate concentric LVH with proximal septal thickening.   LEFT VENTRICLE PLAX 2D LVIDd:         3.90 cm     Diastology LVIDs:         2.60 cm     LV e' medial:    4.54 cm/s LV PW:         1.30 cm     LV E/e' medial:  15.8 LV IVS:        1.90 cm     LV e' lateral:   4.54 cm/s LVOT diam:     2.50 cm     LV E/e' lateral: 15.8 LV SV:         106 LV SV Index:   46 LVOT Area:     4.91 cm  LV Volumes (MOD) LV vol d, MOD A2C: 79.4 ml LV vol d, MOD A4C: 59.1 ml LV vol s, MOD A2C: 28.8 ml LV vol s, MOD A4C: 22.1 ml LV SV MOD A2C:     50.6 ml LV SV MOD A4C:     59.1 ml LV SV MOD BP:      43.7 ml  RIGHT VENTRICLE  IVC RV S prime:     12.60 cm/s  IVC diam: 2.20 cm TAPSE (M-mode): 2.0 cm  LEFT ATRIUM             Index        RIGHT ATRIUM           Index LA diam:        4.00 cm 1.75 cm/m   RA Area:     13.10 cm LA Vol  (A2C):   33.3 ml 14.60 ml/m  RA Volume:   27.20 ml  11.92 ml/m LA Vol (A4C):   55.1 ml 24.16 ml/m LA Biplane Vol: 46.2 ml 20.25 ml/m AORTIC VALVE AV Area (Vmax):    3.62 cm AV Area (Vmean):   3.41 cm AV Area (VTI):     3.69 cm AV Vmax:           148.00 cm/s AV Vmean:          106.000 cm/s AV VTI:            0.286 m AV Peak Grad:      8.8 mmHg AV Mean Grad:      5.0 mmHg LVOT Vmax:         109.00 cm/s LVOT Vmean:        73.700 cm/s LVOT VTI:          0.215 m LVOT/AV VTI ratio: 0.75  AORTA Ao Root diam: 3.50 cm Ao Asc diam:  3.50 cm  MITRAL VALVE MV Area (PHT): 3.08 cm     SHUNTS MV Decel Time: 246 msec     Systemic VTI:  0.22 m MV E velocity: 71.80 cm/s   Systemic Diam: 2.50 cm MV A velocity: 102.00 cm/s MV E/A ratio:  0.70  Olga Millers MD Electronically signed by Olga Millers MD Signature Date/Time: 02/20/2021/11:36:38 AM    Final               Recent Labs: 04/14/2022: ALT 21; BUN 24; Creatinine, Ser 1.17; Potassium 4.5; Sodium 139; TSH 2.83 06/26/2022: Hemoglobin 13.6; Platelets 217  Recent Lipid Panel    Component Value Date/Time   CHOL 113 04/14/2022 1035   CHOL 110 06/25/2021 1003   TRIG 162.0 (H) 04/14/2022 1035   HDL 31.30 (L) 04/14/2022 1035   HDL 34 (L) 06/25/2021 1003   CHOLHDL 4 04/14/2022 1035   VLDL 32.4 04/14/2022 1035   LDLCALC 50 04/14/2022 1035   LDLCALC 55 06/25/2021 1003   LDLDIRECT 55.0 04/15/2021 1425       Physical Exam:    VS:  BP 122/60   Pulse 64   Ht 5\' 8"  (1.727 m)   Wt 262 lb (118.8 kg)   SpO2 98%   BMI 39.84 kg/m     Wt Readings from Last 3 Encounters:  09/17/22 262 lb (118.8 kg)  06/26/22 270 lb (122.5 kg)  06/10/22 265 lb 6.4 oz (120.4 kg)    Gen: no distress, morbid obesity   Neck: No JVD Ears: R Ear Frank Sign Cardiac: No Rubs or Gallops, no murmur, RRR +2 radial pulses Respiratory: Clear to auscultation bilaterally, normal effort, normal  respiratory rate GI: Soft, nontender, non-distended  MS:  +1 bilateral pitting  edema;  moves all extremities Integument: Skin feels warm Neuro:  At time of evaluation, alert and oriented to person/place/time/situation  Psych: Normal affect, patient feels well   ASSESSMENT:    1. Paroxysmal atrial fibrillation (HCC)   2. Coronary artery disease involving native coronary artery  of native heart without angina pectoris   3. Class 2 severe obesity with serious comorbidity and body mass index (BMI) of 35.0 to 35.9 in adult, unspecified obesity type (HCC)   4. Type 2 diabetes mellitus with stage 3a chronic kidney disease, without long-term current use of insulin (HCC)   5. Hyperlipidemia associated with type 2 diabetes mellitus (HCC)   6. Epistaxis   7. LVH (left ventricular hypertrophy)     PLAN:    Atrial Fibrillation  - prior stroke, s/p ILR and now on DOAC - complicated by new nose bloods  - we have discussed the risks and benefits of the Watchman device - if he would like to proceed with this he would need a retrospective CT PV to also assess his hypertrophy, presently hypertensive heart disease is favored over HCM he has had limited imaging  Non obstructive CAD FH (triglycerides) Morbid obesity - on atorvastatin 80 mg LDL at goal, TGS a bit elevated - discussed pros/cons of fenofibrate.  - will decreased snacking, and walk more  HTN with DM Basal Septal hypertrophy (septal thickness 19 mm) without other phenotypical evidence of HCM  - with CKD stage IIIa - father had AF but no HCM - will decrease norvasc to 5 mg, he will check amb BP and LE symptoms; if BP increases we will change to chlorthalidone, if not PRN lasix and compression stockings; goal to trial off norvasx and monitor LE swelling  1st HB RBBB - s/p ILD, monitor  Six months follow upw         Medication Adjustments/Labs and Tests Ordered: Current medicines are reviewed at length with the patient today.  Concerns regarding medicines are outlined above.  Orders  Placed This Encounter  Procedures   EKG 12-Lead   Meds ordered this encounter  Medications   amLODipine (NORVASC) 5 MG tablet    Sig: Take 1 tablet (5 mg total) by mouth daily.    Dispense:  90 tablet    Refill:  3    Patient Instructions  Medication Instructions:  Your physician has recommended you make the following change in your medication:  DECREASE: amlodipine to 5 mg by mouth once daily  *If you need a refill on your cardiac medications before your next appointment, please call your pharmacy*   Lab Work: NONE If you have labs (blood work) drawn today and your tests are completely normal, you will receive your results only by: MyChart Message (if you have MyChart) OR A paper copy in the mail If you have any lab test that is abnormal or we need to change your treatment, we will call you to review the results.   Testing/Procedures: Please monitor your Blood Pressure as discussed with Dr. Izora Ribas.   Follow-Up: At [facility], you and your health needs are our priority.  As part of our continuing mission to provide you with exceptional heart care, we have created designated Provider Care Teams.  These Care Teams include your primary Cardiologist (physician) and Advanced Practice Providers (APPs -  Physician Assistants and Nurse Practitioners) who all work together to provide you with the care you need, when you need it.   Your next appointment:   6 month(s)  Provider:   Christell Constant, MD        Signed, Christell Constant, MD  09/17/2022 8:37 AM    [facility]

## 2022-09-17 ENCOUNTER — Encounter: Payer: Self-pay | Admitting: Internal Medicine

## 2022-09-17 ENCOUNTER — Ambulatory Visit: Payer: Medicare Other | Admitting: Internal Medicine

## 2022-09-17 ENCOUNTER — Encounter (INDEPENDENT_AMBULATORY_CARE_PROVIDER_SITE_OTHER): Payer: Self-pay

## 2022-09-17 VITALS — BP 122/60 | HR 64 | Ht 68.0 in | Wt 262.0 lb

## 2022-09-17 DIAGNOSIS — E1169 Type 2 diabetes mellitus with other specified complication: Secondary | ICD-10-CM | POA: Diagnosis not present

## 2022-09-17 DIAGNOSIS — N1831 Chronic kidney disease, stage 3a: Secondary | ICD-10-CM | POA: Diagnosis not present

## 2022-09-17 DIAGNOSIS — E1122 Type 2 diabetes mellitus with diabetic chronic kidney disease: Secondary | ICD-10-CM | POA: Diagnosis not present

## 2022-09-17 DIAGNOSIS — I480 Paroxysmal atrial fibrillation: Secondary | ICD-10-CM

## 2022-09-17 DIAGNOSIS — R040 Epistaxis: Secondary | ICD-10-CM

## 2022-09-17 DIAGNOSIS — E785 Hyperlipidemia, unspecified: Secondary | ICD-10-CM | POA: Diagnosis not present

## 2022-09-17 DIAGNOSIS — I517 Cardiomegaly: Secondary | ICD-10-CM

## 2022-09-17 DIAGNOSIS — Z6835 Body mass index (BMI) 35.0-35.9, adult: Secondary | ICD-10-CM

## 2022-09-17 DIAGNOSIS — I2510 Atherosclerotic heart disease of native coronary artery without angina pectoris: Secondary | ICD-10-CM

## 2022-09-17 MED ORDER — AMLODIPINE BESYLATE 5 MG PO TABS: 5.0000 mg | ORAL_TABLET | Freq: Every day | ORAL | 3 refills | Status: DC

## 2022-09-17 NOTE — Patient Instructions (Signed)
Medication Instructions:  Your physician has recommended you make the following change in your medication:  DECREASE: amlodipine to 5 mg by mouth once daily  *If you need a refill on your cardiac medications before your next appointment, please call your pharmacy*   Lab Work: NONE If you have labs (blood work) drawn today and your tests are completely normal, you will receive your results only by: MyChart Message (if you have MyChart) OR A paper copy in the mail If you have any lab test that is abnormal or we need to change your treatment, we will call you to review the results.   Testing/Procedures: Please monitor your Blood Pressure as discussed with Dr. Izora Ribas.   Follow-Up: At [facility], you and your health needs are our priority.  As part of our continuing mission to provide you with exceptional heart care, we have created designated Provider Care Teams.  These Care Teams include your primary Cardiologist (physician) and Advanced Practice Providers (APPs -  Physician Assistants and Nurse Practitioners) who all work together to provide you with the care you need, when you need it.   Your next appointment:   6 month(s)  Provider:   Christell Constant, MD

## 2022-09-17 NOTE — Progress Notes (Signed)
[facility] Summary Report / Loop Recorder 

## 2022-09-23 ENCOUNTER — Encounter (INDEPENDENT_AMBULATORY_CARE_PROVIDER_SITE_OTHER): Payer: Medicare Other | Admitting: Ophthalmology

## 2022-09-30 ENCOUNTER — Other Ambulatory Visit: Payer: Self-pay | Admitting: Internal Medicine

## 2022-09-30 ENCOUNTER — Other Ambulatory Visit: Payer: Self-pay | Admitting: Family Medicine

## 2022-09-30 DIAGNOSIS — N1831 Chronic kidney disease, stage 3a: Secondary | ICD-10-CM

## 2022-09-30 DIAGNOSIS — E1159 Type 2 diabetes mellitus with other circulatory complications: Secondary | ICD-10-CM

## 2022-09-30 NOTE — Telephone Encounter (Signed)
Chart supports rx. Last OV: 06/26/2022 Next OV: 10/15/2022

## 2022-10-06 LAB — CUP PACEART REMOTE DEVICE CHECK
Date Time Interrogation Session: 20240902230619
Implantable Pulse Generator Implant Date: 20230407

## 2022-10-12 ENCOUNTER — Ambulatory Visit: Payer: Medicare Other

## 2022-10-12 DIAGNOSIS — I639 Cerebral infarction, unspecified: Secondary | ICD-10-CM | POA: Diagnosis not present

## 2022-10-15 ENCOUNTER — Ambulatory Visit (INDEPENDENT_AMBULATORY_CARE_PROVIDER_SITE_OTHER): Payer: Medicare Other | Admitting: Family Medicine

## 2022-10-15 ENCOUNTER — Encounter: Payer: Self-pay | Admitting: Family Medicine

## 2022-10-15 VITALS — BP 128/84 | HR 69 | Temp 97.9°F | Wt 262.8 lb

## 2022-10-15 DIAGNOSIS — I152 Hypertension secondary to endocrine disorders: Secondary | ICD-10-CM | POA: Diagnosis not present

## 2022-10-15 DIAGNOSIS — E1169 Type 2 diabetes mellitus with other specified complication: Secondary | ICD-10-CM

## 2022-10-15 DIAGNOSIS — E1122 Type 2 diabetes mellitus with diabetic chronic kidney disease: Secondary | ICD-10-CM

## 2022-10-15 DIAGNOSIS — E785 Hyperlipidemia, unspecified: Secondary | ICD-10-CM | POA: Diagnosis not present

## 2022-10-15 DIAGNOSIS — E1159 Type 2 diabetes mellitus with other circulatory complications: Secondary | ICD-10-CM | POA: Diagnosis not present

## 2022-10-15 DIAGNOSIS — I2510 Atherosclerotic heart disease of native coronary artery without angina pectoris: Secondary | ICD-10-CM | POA: Diagnosis not present

## 2022-10-15 DIAGNOSIS — Z6839 Body mass index (BMI) 39.0-39.9, adult: Secondary | ICD-10-CM

## 2022-10-15 DIAGNOSIS — N1831 Chronic kidney disease, stage 3a: Secondary | ICD-10-CM | POA: Diagnosis not present

## 2022-10-15 DIAGNOSIS — Z7985 Long-term (current) use of injectable non-insulin antidiabetic drugs: Secondary | ICD-10-CM

## 2022-10-15 DIAGNOSIS — Z8673 Personal history of transient ischemic attack (TIA), and cerebral infarction without residual deficits: Secondary | ICD-10-CM

## 2022-10-15 DIAGNOSIS — R040 Epistaxis: Secondary | ICD-10-CM | POA: Diagnosis not present

## 2022-10-15 DIAGNOSIS — R7989 Other specified abnormal findings of blood chemistry: Secondary | ICD-10-CM

## 2022-10-15 DIAGNOSIS — I480 Paroxysmal atrial fibrillation: Secondary | ICD-10-CM

## 2022-10-15 DIAGNOSIS — E119 Type 2 diabetes mellitus without complications: Secondary | ICD-10-CM

## 2022-10-15 LAB — LIPID PANEL
Cholesterol: 108 mg/dL (ref 0–200)
HDL: 31.4 mg/dL — ABNORMAL LOW (ref >39.00)
LDL Cholesterol: 47 mg/dL (ref 0–99)
NonHDL: 77
Total CHOL/HDL Ratio: 3
Triglycerides: 148 mg/dL (ref 0.0–149.0)
VLDL: 29.6 mg/dL (ref 0.0–40.0)

## 2022-10-15 LAB — CBC WITH DIFFERENTIAL/PLATELET
Basophils Absolute: 0 10*3/uL (ref 0.0–0.1)
Basophils Relative: 0.6 % (ref 0.0–3.0)
Eosinophils Absolute: 0.2 10*3/uL (ref 0.0–0.7)
Eosinophils Relative: 3 % (ref 0.0–5.0)
HCT: 40.6 % (ref 39.0–52.0)
Hemoglobin: 13.1 g/dL (ref 13.0–17.0)
Lymphocytes Relative: 33.7 % (ref 12.0–46.0)
Lymphs Abs: 2.3 10*3/uL (ref 0.7–4.0)
MCHC: 32.2 g/dL (ref 30.0–36.0)
MCV: 93 fl (ref 78.0–100.0)
Monocytes Absolute: 0.6 10*3/uL (ref 0.1–1.0)
Monocytes Relative: 8.2 % (ref 3.0–12.0)
Neutro Abs: 3.7 10*3/uL (ref 1.4–7.7)
Neutrophils Relative %: 54.5 % (ref 43.0–77.0)
Platelets: 212 10*3/uL (ref 150.0–400.0)
RBC: 4.37 Mil/uL (ref 4.22–5.81)
RDW: 15.3 % (ref 11.5–15.5)
WBC: 6.8 10*3/uL (ref 4.0–10.5)

## 2022-10-15 LAB — COMPREHENSIVE METABOLIC PANEL
ALT: 52 U/L (ref 0–53)
AST: 53 U/L — ABNORMAL HIGH (ref 0–37)
Albumin: 4 g/dL (ref 3.5–5.2)
Alkaline Phosphatase: 116 U/L (ref 39–117)
BUN: 27 mg/dL — ABNORMAL HIGH (ref 6–23)
CO2: 26 meq/L (ref 19–32)
Calcium: 9.1 mg/dL (ref 8.4–10.5)
Chloride: 105 meq/L (ref 96–112)
Creatinine, Ser: 1.13 mg/dL (ref 0.40–1.50)
GFR: 66.51 mL/min (ref >60.00)
Glucose, Bld: 112 mg/dL — ABNORMAL HIGH (ref 70–99)
Potassium: 4.6 meq/L (ref 3.5–5.1)
Sodium: 141 meq/L (ref 135–145)
Total Bilirubin: 0.6 mg/dL (ref 0.2–1.2)
Total Protein: 7.4 g/dL (ref 6.0–8.3)

## 2022-10-15 LAB — TSH: TSH: 2.77 u[IU]/mL (ref 0.35–5.50)

## 2022-10-15 LAB — POCT GLYCOSYLATED HEMOGLOBIN (HGB A1C): Hemoglobin A1C: 6.9 % — AB (ref 4.0–5.6)

## 2022-10-15 MED ORDER — TIRZEPATIDE 5 MG/0.5ML ~~LOC~~ SOAJ
5.0000 mg | SUBCUTANEOUS | 0 refills | Status: DC
Start: 2022-10-15 — End: 2022-12-10

## 2022-10-15 NOTE — Assessment & Plan Note (Signed)
Well-managed with A1C 6.9% Plan: Discontinue Glipizide, start tirzepatide 5mg  once weekly. Monitor blood sugar levels and assess for GI side effects.

## 2022-10-15 NOTE — Assessment & Plan Note (Signed)
Elevated triglycerides noted previously Plan: Recheck fasting lipids. Continue atorvastatin. Consider fenofibrate if triglycerides remain elevated.

## 2022-10-15 NOTE — Progress Notes (Signed)
Assessment/Plan:   Problem List Items Addressed This Visit       Cardiovascular and Mediastinum   Hypertension associated with diabetes (HCC)    lood pressure well-controlled Plan: Continue current medications - amlodipine, hydrochlorothiazide, metoprolol, and losartan.      Relevant Medications   tirzepatide (MOUNJARO) 5 MG/0.5ML Pen   Other Relevant Orders   POCT glycosylated hemoglobin (Hb A1C) (Completed)   TSH (Completed)   Lipid panel (Completed)   Vitamin D 1,25 dihydroxy   CBC with Differential/Platelet (Completed)   Comprehensive metabolic panel (Completed)   Coronary artery disease involving native coronary artery of native heart without angina pectoris   Relevant Medications   tirzepatide (MOUNJARO) 5 MG/0.5ML Pen   Paroxysmal atrial fibrillation (HCC)     Endocrine   Type 2 diabetes mellitus with stage 3a chronic kidney disease, without long-term current use of insulin (HCC) - Primary    Well-managed with A1C 6.9% Plan: Discontinue Glipizide, start tirzepatide 5mg  once weekly. Monitor blood sugar levels and assess for GI side effects.      Relevant Medications   tirzepatide (MOUNJARO) 5 MG/0.5ML Pen   Other Relevant Orders   POCT glycosylated hemoglobin (Hb A1C) (Completed)   TSH (Completed)   Lipid panel (Completed)   Vitamin D 1,25 dihydroxy   CBC with Differential/Platelet (Completed)   Comprehensive metabolic panel (Completed)   Hyperlipidemia associated with type 2 diabetes mellitus (HCC)    Elevated triglycerides noted previously Plan: Recheck fasting lipids. Continue atorvastatin. Consider fenofibrate if triglycerides remain elevated.      Relevant Medications   tirzepatide [facility]) 5 MG/0.5ML Pen     Other   History of ischemic stroke   Class 2 severe obesity with serious comorbidity in adult [facility])   Relevant Medications   tirzepatide (MOUNJARO) 5 MG/0.5ML Pen   Epistaxis   Other Visit Diagnoses     Type 2 diabetes mellitus without  complication, without long-term current use of insulin (HCC)       Relevant Medications   tirzepatide (MOUNJARO) 5 MG/0.5ML Pen       Medications Discontinued During This Encounter  Medication Reason   glipiZIDE (GLUCOTROL) 5 MG tablet     Return in about 3 months (around 01/14/2023) for BP, DM, HLD.    Subjective:   Encounter date: 10/15/2022  Joe Stewart is a 67 y.o. male who has Hypertension associated with diabetes (HCC); Abnormal nuclear stress test; Type 2 diabetes mellitus with stage 3a chronic kidney disease, without long-term current use of insulin (HCC); Arthritis of left knee; Cecal polyp; History of colon polyps; Abnormal colonoscopy; Anemia; Hyperlipidemia associated with type 2 diabetes mellitus (HCC); Snores; History of ischemic stroke; Familial hypercholesterolemia; Coronary artery disease involving native coronary artery of native heart without angina pectoris; Class 2 severe obesity with serious comorbidity in adult [facility]); Epistaxis; Paroxysmal atrial fibrillation (HCC); Hypercoagulable state due to paroxysmal atrial fibrillation (HCC); and LVH (left ventricular hypertrophy) on their problem list..   He  has a past medical history of Arthritis, Cataract, Diabetes mellitus without complication (HCC), Diabetic retinopathy (HCC), Hyperlipidemia, Hypertension, Hypertensive retinopathy, Obesity, and Stroke (HCC)..   Chief Complaint: Follow-up visit for diabetes management.  History of Present Illness: Diabetes Mellitus Type 2. The patient, Joe Stewart, presents for a follow-up visit on his diabetes management. He reports monitoring his blood pressure and blood sugars regularly. Blood pressures are around 121/72 mmHg, and blood sugars range between 96-130 mg/dL. His hemoglobin A1C is 6.9%. He has no symptoms of chest pain, shortness of  breath, excessive thirst, or frequent urination. Currently on metformin, Jardiance, and Glipizide.   Hypertension. Blood pressure readings at home  are at goal, around 121/72 mmHg. Currently on amlodipine, hydrochlorothiazide, metoprolol, and losartan. Recent blood pressure control effective without new symptoms.  Hyperlipidemia. Cholesterol and triglycerides previously elevated. On atorvastatin for cholesterol management; potential consideration for fenofibrate discussed. No recent fasting lipids checked.  Atrial Fibrillation (AFib). Currently managed with Eliquis. No new symptoms post-stroke. Cardiologist had discussed the possibility of a Watchman procedure, but patient does not recall discussion details.  Coronary Artery Disease/History of Stroke. No current symptoms of chest pain or shortness of breath. Cardiologist managing heart disease.  HGBA1C Lab Results  Component Value Date   HGBA1C 6.9 (A) 10/15/2022   HGBA1C 7.3 (A) 04/14/2022   HGBA1C 6.9 (A) 01/13/2022    Lipid Panel Lab Results  Component Value Date   CHOL 108 10/15/2022   TRIG 148.0 10/15/2022   HDL 31.40 (L) 10/15/2022   LDLCALC 47 10/15/2022    Renal Function Lab Results  Component Value Date   CREATININE 1.13 10/15/2022   GFR 66.51 10/15/2022   MICROALBUR 10.2 (H) 04/14/2022   LABCREAU 92.6 04/14/2022   MICRALBCREAT 11.0 04/14/2022     Review of Systems  Constitutional:  Negative for chills, diaphoresis, fever, malaise/fatigue and weight loss.  HENT:  Negative for congestion, ear discharge, ear pain and hearing loss.   Eyes:  Negative for blurred vision, double vision, photophobia, pain, discharge and redness.  Respiratory:  Negative for cough, sputum production, shortness of breath and wheezing.   Cardiovascular:  Negative for chest pain and palpitations.  Gastrointestinal:  Negative for abdominal pain, blood in stool, constipation, diarrhea, heartburn, melena, nausea and vomiting.  Genitourinary:  Negative for dysuria, flank pain, frequency, hematuria and urgency.  Musculoskeletal:  Negative for myalgias.  Skin:  Negative for itching and  rash.  Neurological:  Negative for dizziness, tingling, tremors, sensory change, speech change, focal weakness, seizures, loss of consciousness, weakness and headaches.  Endo/Heme/Allergies:  Negative for polydipsia.  Psychiatric/Behavioral:  Negative for depression, hallucinations, memory loss, substance abuse and suicidal ideas. The patient does not have insomnia.   All other systems reviewed and are negative.   Past Surgical History:  Procedure Laterality Date   BIOPSY  11/29/2019   Procedure: BIOPSY;  Surgeon: Meridee Score Netty Starring., MD;  Location: [facility];  Service: Gastroenterology;;   COLONOSCOPY WITH PROPOFOL N/A 11/29/2019   Procedure: COLONOSCOPY WITH PROPOFOL;  Surgeon: Lemar Lofty., MD;  Location: [facility];  Service: Gastroenterology;  Laterality: N/A;   ENDOSCOPIC MUCOSAL RESECTION N/A 11/29/2019   Procedure: ENDOSCOPIC MUCOSAL RESECTION;  Surgeon: Meridee Score Netty Starring., MD;  Location: [facility];  Service: Gastroenterology;  Laterality: N/A;   ESOPHAGOGASTRODUODENOSCOPY (EGD) WITH PROPOFOL N/A 11/29/2019   Procedure: ESOPHAGOGASTRODUODENOSCOPY (EGD) WITH PROPOFOL;  Surgeon: Meridee Score Netty Starring., MD;  Location: [facility];  Service: Gastroenterology;  Laterality: N/A;   EYE SURGERY Left    had to have eye flushed after getting costic sodium in L eye   HEMOSTASIS CLIP PLACEMENT  11/29/2019   Procedure: HEMOSTASIS CLIP PLACEMENT;  Surgeon: Lemar Lofty., MD;  Location: [facility];  Service: Gastroenterology;;   HERNIA REPAIR     HERNIA REPAIR     35 years ago   HOT HEMOSTASIS N/A 11/29/2019   Procedure: HOT HEMOSTASIS (ARGON PLASMA COAGULATION/BICAP);  Surgeon: Lemar Lofty., MD;  Location: [facility];  Service: Gastroenterology;  Laterality: N/A;   JOINT REPLACEMENT  2021   Left knee  replacement   LEFT HEART CATH AND CORONARY ANGIOGRAPHY N/A 04/11/2019   Procedure: LEFT HEART CATH AND CORONARY ANGIOGRAPHY;  Surgeon: Yates Decamp, MD;  Location: [facility];  Service: Cardiovascular;  Laterality: N/A;   POLYPECTOMY  11/29/2019   Procedure: POLYPECTOMY;  Surgeon: Meridee Score Netty Starring., MD;  Location: [facility];  Service: Gastroenterology;;   RENAL ANGIOGRAPHY Bilateral 04/11/2019   Procedure: RENAL ANGIOGRAPHY;  Surgeon: Yates Decamp, MD;  Location: [facility]AS[facility] CV LAB;  Service: Cardiovascular;  Laterality: Bilateral;   SUBMUCOSAL LIFTING INJECTION  11/29/2019   Procedure: SUBMUCOSAL LIFTING INJECTION;  Surgeon: Lemar Lofty., MD;  Location: [facility];  Service: Gastroenterology;;   TOTAL KNEE ARTHROPLASTY Left 08/01/2019   Procedure: LEFT TOTAL KNEE ARTHROPLASTY;  Surgeon: Cammy Copa, MD;  Location: [facility] OR;  Service: Orthopedics;  Laterality: Left;    Outpatient Medications Prior to Visit  Medication Sig Dispense Refill   amLODipine (NORVASC) 5 MG tablet Take 1 tablet (5 mg total) by mouth daily. 90 tablet 3   apixaban (ELIQUIS) 5 MG TABS tablet TAKE 1 TABLET BY MOUTH TWICE A DAY 60 tablet 6   atorvastatin (LIPITOR) 80 MG tablet TAKE 1 TABLET BY MOUTH EVERY DAY 90 tablet 1   blood glucose meter kit and supplies KIT Dispense based on patient and insurance preference. Use up to four times daily as directed. 1 each 0   hydrochlorothiazide (HYDRODIURIL) 25 MG tablet TAKE 1 TABLET (25 MG TOTAL) BY MOUTH DAILY. 90 tablet 3   JARDIANCE 25 MG TABS tablet TAKE 1 TABLET (25 MG TOTAL) BY MOUTH DAILY. 30 tablet 2   losartan (COZAAR) 100 MG tablet TAKE 1 TABLET BY MOUTH EVERY DAY 90 tablet 0   metFORMIN (GLUCOPHAGE) 500 MG tablet Take 1 tablet (500 mg total) by mouth 2 (two) times daily with a meal. 180 tablet 3   metoprolol succinate (TOPROL-XL) 50 MG 24 hr tablet Take 1 tablet (50 mg total) by mouth daily. Take with or immediately following a meal. 90 tablet 3   glipiZIDE (GLUCOTROL) 5 MG tablet TAKE 1 TABLET (5 MG TOTAL) BY MOUTH TWICE A DAY BEFORE MEALS 180 tablet 1   No facility-administered  medications prior to visit.    Family History  Problem Relation Age of Onset   Diabetes Mother    COPD Mother    Cancer Mother        unknown type    Heart disease Father    COPD Sister    Healthy Brother    Heart disease Brother    Healthy Son    Colon cancer Neg Hx    Colon polyps Neg Hx    Esophageal cancer Neg Hx    Rectal cancer Neg Hx    Stomach cancer Neg Hx    Inflammatory bowel disease Neg Hx    Liver disease Neg Hx    Pancreatic cancer Neg Hx     Social History   Socioeconomic History   Marital status: Single    Spouse name: Not on file   Number of children: 1   Years of education: Not on file   Highest education level: Not on file  Occupational History   Not on file  Tobacco Use   Smoking status: Never   Smokeless tobacco: Never  Vaping Use   Vaping status: Never Used  Substance and Sexual Activity   Alcohol use: Not Currently   Drug use: Never   Sexual activity: Not Currently  Other Topics Concern  Not on file  Social History Narrative   Not on file   Social Determinants of Health   Financial Resource Strain: Low Risk  (06/23/2022)   Overall Financial Resource Strain (CARDIA)    Difficulty of Paying Living Expenses: Not hard at all  Food Insecurity: No Food Insecurity (06/23/2022)   Hunger Vital Sign    Worried About Running Out of Food in the Last Year: Never true    Ran Out of Food in the Last Year: Never true  Transportation Needs: No Transportation Needs (06/23/2022)   PRAPARE - Administrator, Civil Service (Medical): No    Lack of Transportation (Non-Medical): No  Physical Activity: Insufficiently Active (06/23/2022)   Exercise Vital Sign    Days of Exercise per Week: 2 days    Minutes of Exercise per Session: 30 min  Stress: No Stress Concern Present (05/28/2021)   [employer] of Occupational Health - Occupational Stress Questionnaire    Feeling of Stress : Only a little  Social Connections: Unknown (06/23/2022)    Social Connection and Isolation Panel [NHANES]    Frequency of Communication with Friends and Family: More than three times a week    Frequency of Social Gatherings with Friends and Family: Three times a week    Attends Religious Services: Not on file    Active Member of Clubs or Organizations: No    Attends Banker Meetings: Never    Marital Status: Divorced  Intimate Partner Violence: Not At Risk (05/28/2021)   Humiliation, Afraid, Rape, and Kick questionnaire    Fear of Current or Ex-Partner: No    Emotionally Abused: No    Physically Abused: No    Sexually Abused: No                                                                                                  Objective:  Physical Exam: BP 128/84 (BP Location: Left Arm, Patient Position: Sitting, Cuff Size: Large)   Pulse 69   Temp 97.9 F (36.6 C) (Temporal)   Wt 262 lb 12.8 oz (119.2 kg)   SpO2 98%   BMI 39.96 kg/m   Wt Readings from Last 3 Encounters:  10/15/22 262 lb 12.8 oz (119.2 kg)  09/17/22 262 lb (118.8 kg)  06/26/22 270 lb (122.5 kg)     Physical Exam Constitutional:      Appearance: Normal appearance.  HENT:     Head: Normocephalic and atraumatic.     Right Ear: Hearing normal.     Left Ear: Hearing normal.     Nose: Nose normal.  Eyes:     General: No scleral icterus.       Right eye: No discharge.        Left eye: No discharge.     Extraocular Movements: Extraocular movements intact.  Cardiovascular:     Rate and Rhythm: Normal rate and regular rhythm.     Heart sounds: Normal heart sounds.  Pulmonary:     Effort: Pulmonary effort is normal.     Breath sounds: Normal breath sounds.  Abdominal:  Palpations: Abdomen is soft.     Tenderness: There is no abdominal tenderness.  Skin:    General: Skin is warm.     Findings: No rash.  Neurological:     General: No focal deficit present.     Mental Status: He is alert.     Cranial Nerves: No cranial nerve deficit.   Psychiatric:        Mood and Affect: Mood normal.        Behavior: Behavior normal.        Thought Content: Thought content normal.        Judgment: Judgment normal.     CUP PACEART REMOTE DEVICE CHECK  Result Date: 10/06/2022 ILR summary report received. Battery status OK. Normal device function. No new symptom, tachy, brady, or pause episodes. No new AF episodes.  AF burden is 0% of the time.   Monthly summary reports and ROV/PRN ML, CVRS  CUP PACEART REMOTE DEVICE CHECK  Result Date: 09/03/2022 ILR summary report received. Battery status OK. Normal device function. No new symptom, tachy, brady, or pause episodes. No new AF episodes. Monthly summary reports and ROV/PRN LA, CVRS  Intravitreal Injection, Pharmacologic Agent - OD - Right Eye  Result Date: 08/26/2022 Time Out 08/26/2022. 9:03 AM. Confirmed correct patient, procedure, site, and patient consented. Anesthesia Topical anesthesia was used. Anesthetic medications included Lidocaine 2%, Proparacaine 0.5%. Procedure Preparation included 5% betadine to ocular surface, eyelid speculum. A (32g) needle was used. Injection: 2 mg aflibercept 2 MG/0.05ML   Route: Intravitreal, Site: Right Eye   NDC: L6038910, Lot: [id number], Expiration date: 08/02/2023, Waste: 0 mL Post-op Post injection exam found visual acuity of at least counting fingers. The patient tolerated the procedure well. There were no complications. The patient received written and verbal post procedure care education. Post injection medications were not given.   Intravitreal Injection, Pharmacologic Agent - OS - Left Eye  Result Date: 08/26/2022 Time Out 08/26/2022. 9:04 AM. Confirmed correct patient, procedure, site, and patient consented. Anesthesia Topical anesthesia was used. Anesthetic medications included Lidocaine 2%, Proparacaine 0.5%. Procedure Preparation included 5% betadine to ocular surface, eyelid speculum. A (32g) needle was used. Injection: 2 mg aflibercept 2  MG/0.05ML   Route: Intravitreal, Site: Left Eye   NDC: L6038910, Lot: [id number], Expiration date: 10/03/2023, Waste: 0 mL Post-op Post injection exam found visual acuity of at least counting fingers. The patient tolerated the procedure well. There were no complications. The patient received written and verbal post procedure care education.   OCT, Retina - OU - Both Eyes  Result Date: 08/26/2022 Right Eye Quality was good. Central Foveal Thickness: 339. Progression has improved. Findings include normal foveal contour, no SRF, intraretinal hyper-reflective material, intraretinal fluid (Interval improvement in central cyst, mild interval increase in cystic changes temporal mac, very shallow schisis ST periphery caught on widefield--not imaged today). Left Eye Quality was good. Central Foveal Thickness: 325. Progression has improved. Findings include normal foveal contour, no SRF, intraretinal hyper-reflective material, intraretinal fluid, vitreomacular adhesion (Persistent IRF/central edema greatest superior macula and fovea -- slightly improved). Notes *Images captured and stored on drive Diagnosis / Impression: DME OU OD: Interval improvement in central cyst, mild interval increase in cystic changes temporal mac, very shallow schisis ST periphery caught on widefield--not imaged today OS: Persistent IRF/central edema greatest superior macula and fovea -- slightly improved Clinical management: See below Abbreviations: NFP - Normal foveal profile. CME - cystoid macular edema. PED - pigment epithelial detachment. IRF - intraretinal fluid. SRF - subretinal  fluid. EZ - ellipsoid zone. ERM - epiretinal membrane. ORA - outer retinal atrophy. ORT - outer retinal tubulation. SRHM - subretinal hyper-reflective material. IRHM - intraretinal hyper-reflective material   CUP PACEART REMOTE DEVICE CHECK  Result Date: 07/31/2022 ILR summary report received. Battery status OK. Normal device function. No new symptom, tachy,  brady, or pause episodes. 1 new AF episode, 6 hr, V rates primarily controlled, on Eliquis and metoprolol per Epic. Monthly summary reports and ROV/PRN. MC, CVRS.   Recent Results (from the past 2160 hour(s))  CUP PACEART REMOTE DEVICE CHECK     Status: None   Collection Time: 07/30/22 11:03 PM  Result Value Ref Range   Date Time Interrogation Session [id number]    Pulse Generator Manufacturer MERM    Pulse Gen Model LNQ22 LINQ II    Pulse Gen Serial Number [id number]    Clinic Name [facility]    Implantable Pulse Generator Type ICM/ILR    Implantable Pulse Generator Implant Date [id number]   CUP PACEART REMOTE DEVICE CHECK     Status: None   Collection Time: 09/02/22 11:03 PM  Result Value Ref Range   Date Time Interrogation Session [id number]    Pulse Generator Manufacturer MERM    Pulse Gen Model LNQ22 LINQ II    Pulse Gen Serial Number [id number]    Clinic Name [facility]    Implantable Pulse Generator Type ICM/ILR    Implantable Pulse Generator Implant Date [id number]   CUP PACEART REMOTE DEVICE CHECK     Status: None   Collection Time: 10/05/22 11:06 PM  Result Value Ref Range   Date Time Interrogation Session [id number]    Pulse Generator Manufacturer MERM    Pulse Gen Model LNQ22 LINQ II    Pulse Gen Serial Number [id number]    Clinic Name [facility]    Implantable Pulse Generator Type ICM/ILR    Implantable Pulse Generator Implant Date [id number]    Eval Rhythm SR at  70 bpm   POCT glycosylated hemoglobin (Hb A1C)     Status: Abnormal   Collection Time: 10/15/22  9:01 AM  Result Value Ref Range   Hemoglobin A1C 6.9 (A) 4.0 - 5.6 %   HbA1c POC (<> result, manual entry)     HbA1c, POC (prediabetic range)     HbA1c, POC (controlled diabetic range)    TSH     Status: None   Collection Time: 10/15/22  9:35 AM  Result Value Ref Range   TSH 2.77 0.35 - 5.50 uIU/mL  Lipid panel     Status: Abnormal   Collection Time: 10/15/22  9:35 AM  Result Value  Ref Range   Cholesterol 108 0 - 200 mg/dL    Comment: ATP III Classification       Desirable:  < 200 mg/dL               Borderline High:  200 - 239 mg/dL          High:  > = 371 mg/dL   Triglycerides 696.7 0.0 - 149.0 mg/dL    Comment: Normal:  <893 mg/dLBorderline High:  150 - 199 mg/dL   HDL 81.01 (L) >75.10 mg/dL   VLDL 25.8 0.0 - 52.7 mg/dL   LDL Cholesterol 47 0 - 99 mg/dL   Total CHOL/HDL Ratio 3     Comment:                Men          Women1/2  Average Risk     3.4          3.3Average Risk          5.0          4.42X Average Risk          9.6          7.13X Average Risk          15.0          11.0                       NonHDL 77.00     Comment: NOTE:  Non-HDL goal should be 30 mg/dL higher than patient's LDL goal (i.e. LDL goal of < 70 mg/dL, would have non-HDL goal of < 100 mg/dL)  CBC with Differential/Platelet     Status: None   Collection Time: 10/15/22  9:35 AM  Result Value Ref Range   WBC 6.8 4.0 - 10.5 K/uL   RBC 4.37 4.22 - 5.81 Mil/uL   Hemoglobin 13.1 13.0 - 17.0 g/dL   HCT 16.1 09.6 - 04.5 %   MCV 93.0 78.0 - 100.0 fl   MCHC 32.2 30.0 - 36.0 g/dL   RDW 40.9 81.1 - 91.4 %   Platelets 212.0 150.0 - 400.0 K/uL   Neutrophils Relative % 54.5 43.0 - 77.0 %   Lymphocytes Relative 33.7 12.0 - 46.0 %   Monocytes Relative 8.2 3.0 - 12.0 %   Eosinophils Relative 3.0 0.0 - 5.0 %   Basophils Relative 0.6 0.0 - 3.0 %   Neutro Abs 3.7 1.4 - 7.7 K/uL   Lymphs Abs 2.3 0.7 - 4.0 K/uL   Monocytes Absolute 0.6 0.1 - 1.0 K/uL   Eosinophils Absolute 0.2 0.0 - 0.7 K/uL   Basophils Absolute 0.0 0.0 - 0.1 K/uL  Comprehensive metabolic panel     Status: Abnormal   Collection Time: 10/15/22  9:35 AM  Result Value Ref Range   Sodium 141 135 - 145 mEq/L   Potassium 4.6 3.5 - 5.1 mEq/L   Chloride 105 96 - 112 mEq/L   CO2 26 19 - 32 mEq/L   Glucose, Bld 112 (H) 70 - 99 mg/dL   BUN 27 (H) 6 - 23 mg/dL   Creatinine, Ser 7.82 0.40 - 1.50 mg/dL   Total Bilirubin 0.6 0.2 - 1.2 mg/dL    Alkaline Phosphatase 116 39 - 117 U/L   AST 53 (H) 0 - 37 U/L   ALT 52 0 - 53 U/L   Total Protein 7.4 6.0 - 8.3 g/dL   Albumin 4.0 3.5 - 5.2 g/dL   GFR 95.62 >13.08 mL/min    Comment: Calculated using the CKD-EPI Creatinine Equation (2021)   Calcium 9.1 8.4 - 10.5 mg/dL        Garner Nash, MD, MS

## 2022-10-15 NOTE — Assessment & Plan Note (Signed)
lood pressure well-controlled Plan: Continue current medications - amlodipine, hydrochlorothiazide, metoprolol, and losartan.

## 2022-10-15 NOTE — Patient Instructions (Addendum)
Monitor your blood pressure and blood sugar daily:  Watch for any symptoms: Contact office or go to ED, if you experience chest pain, shortness of breath, excessive thirst, or increased urination. For diabetes, discontinue glipizide and start Mounjaro (tirzepatide) 5 mg once a week.  Be cautious of side effects: Watch for gastrointestinal symptoms when starting Mounjaro and manage these by eating smaller meals. Maintain all your other current medications.  Recheck routine lab work as discussed.

## 2022-10-18 LAB — VITAMIN D 1,25 DIHYDROXY
Vitamin D 1, 25 (OH)2 Total: 21 pg/mL (ref 18–72)
Vitamin D2 1, 25 (OH)2: <8 pg/mL
Vitamin D3 1, 25 (OH)2: 21 pg/mL

## 2022-10-23 ENCOUNTER — Other Ambulatory Visit (INDEPENDENT_AMBULATORY_CARE_PROVIDER_SITE_OTHER): Payer: Medicare Other

## 2022-10-23 DIAGNOSIS — R7989 Other specified abnormal findings of blood chemistry: Secondary | ICD-10-CM

## 2022-10-23 LAB — COMPREHENSIVE METABOLIC PANEL
ALT: 60 U/L — ABNORMAL HIGH (ref 0–53)
AST: 63 U/L — ABNORMAL HIGH (ref 0–37)
Albumin: 4.1 g/dL (ref 3.5–5.2)
Alkaline Phosphatase: 130 U/L — ABNORMAL HIGH (ref 39–117)
BUN: 26 mg/dL — ABNORMAL HIGH (ref 6–23)
CO2: 25 mEq/L (ref 19–32)
Calcium: 9 mg/dL (ref 8.4–10.5)
Chloride: 104 mEq/L (ref 96–112)
Creatinine, Ser: 1.17 mg/dL (ref 0.40–1.50)
GFR: 63.78 mL/min (ref >60.00)
Glucose, Bld: 131 mg/dL — ABNORMAL HIGH (ref 70–99)
Potassium: 4.4 mEq/L (ref 3.5–5.1)
Sodium: 139 mEq/L (ref 135–145)
Total Bilirubin: 0.8 mg/dL (ref 0.2–1.2)
Total Protein: 7.4 g/dL (ref 6.0–8.3)

## 2022-10-26 ENCOUNTER — Telehealth: Payer: Self-pay | Admitting: Family Medicine

## 2022-10-26 ENCOUNTER — Other Ambulatory Visit (INDEPENDENT_AMBULATORY_CARE_PROVIDER_SITE_OTHER): Payer: Medicare Other

## 2022-10-26 DIAGNOSIS — R7989 Other specified abnormal findings of blood chemistry: Secondary | ICD-10-CM

## 2022-10-26 NOTE — Telephone Encounter (Signed)
Patient is aware of annotation below and verbalized understanding.

## 2022-10-26 NOTE — Telephone Encounter (Signed)
Advised pt of result note and he verified understanding . Pt scheduled for lab visit today at 3pm. Will call imaging back to schedule imaging.   He also stated that the mounjaro would cost him $800 with his insurance and he declined. He started taking his glipizide again and wanted to know if that was ok?

## 2022-10-26 NOTE — Addendum Note (Signed)
Addended by: Fanny Bien B on: 10/26/2022 10:00 AM   Modules accepted: Orders

## 2022-10-26 NOTE — Telephone Encounter (Signed)
Pt is needing cb concerning why he is needing a US Abdomen Limited RUQ (LIVER/GB) (Order [id number]). They called him trying to schedule an appt, he was unsure about the call. He did not scheule an appt. Please advise pt at [phone]

## 2022-10-27 LAB — NASH FIBROSURE(R) PLUS

## 2022-10-27 LAB — ACUTE HEP PANEL AND HEP B SURFACE AB
HEPATITIS C ANTIBODY REFILL$(REFL): NONREACTIVE
Hep A IgM: NONREACTIVE
Hep B C IgM: NONREACTIVE
Hepatitis B Surface Ag: NONREACTIVE

## 2022-10-27 LAB — FIB-4 W/RX NASH FIBROSURE PLUS
ALT: 72 IU/L — ABNORMAL HIGH (ref 0–44)
AST: 80 IU/L — ABNORMAL HIGH (ref 0–40)
FIB-4 Index: 3.1 — ABNORMAL HIGH (ref 0.00–2.67)
Platelets: 210 10*3/uL (ref 150–450)

## 2022-10-27 LAB — GAMMA GT: GGT: 202 U/L — ABNORMAL HIGH (ref 7–51)

## 2022-10-27 LAB — REFLEX TIQ

## 2022-10-27 NOTE — Addendum Note (Signed)
Addended by: Fanny Bien B on: 10/27/2022 01:03 PM   Modules accepted: Orders

## 2022-10-29 NOTE — Progress Notes (Signed)
[facility] Summary Report / Loop Recorder 

## 2022-10-31 ENCOUNTER — Ambulatory Visit (HOSPITAL_BASED_OUTPATIENT_CLINIC_OR_DEPARTMENT_OTHER)
Admission: RE | Admit: 2022-10-31 | Discharge: 2022-10-31 | Disposition: A | Discharge status: Home / Self Care | Payer: Medicare Other | Source: Ambulatory Visit | Attending: Family Medicine | Admitting: Family Medicine

## 2022-10-31 DIAGNOSIS — R7989 Other specified abnormal findings of blood chemistry: Secondary | ICD-10-CM | POA: Insufficient documentation

## 2022-10-31 DIAGNOSIS — K838 Other specified diseases of biliary tract: Secondary | ICD-10-CM | POA: Diagnosis not present

## 2022-10-31 DIAGNOSIS — K820 Obstruction of gallbladder: Secondary | ICD-10-CM | POA: Diagnosis not present

## 2022-10-31 DIAGNOSIS — K828 Other specified diseases of gallbladder: Secondary | ICD-10-CM | POA: Diagnosis not present

## 2022-11-02 ENCOUNTER — Other Ambulatory Visit: Payer: Self-pay | Admitting: Family Medicine

## 2022-11-02 DIAGNOSIS — N1831 Chronic kidney disease, stage 3a: Secondary | ICD-10-CM

## 2022-11-02 DIAGNOSIS — E1159 Type 2 diabetes mellitus with other circulatory complications: Secondary | ICD-10-CM

## 2022-11-09 ENCOUNTER — Ambulatory Visit: Payer: Medicare Other

## 2022-11-16 ENCOUNTER — Ambulatory Visit: Payer: Medicare Other

## 2022-11-16 ENCOUNTER — Ambulatory Visit (INDEPENDENT_AMBULATORY_CARE_PROVIDER_SITE_OTHER): Payer: Medicare Other

## 2022-11-16 DIAGNOSIS — I639 Cerebral infarction, unspecified: Secondary | ICD-10-CM

## 2022-11-16 LAB — CUP PACEART REMOTE DEVICE CHECK
Date Time Interrogation Session: 20241013230447
Implantable Pulse Generator Implant Date: 20230407

## 2022-11-26 ENCOUNTER — Other Ambulatory Visit: Payer: Self-pay | Admitting: Family Medicine

## 2022-11-26 DIAGNOSIS — I10 Essential (primary) hypertension: Secondary | ICD-10-CM

## 2022-11-26 DIAGNOSIS — E119 Type 2 diabetes mellitus without complications: Secondary | ICD-10-CM

## 2022-12-02 NOTE — Progress Notes (Signed)
[facility] Summary Report / Loop Recorder 

## 2022-12-08 ENCOUNTER — Other Ambulatory Visit: Payer: Self-pay | Admitting: Family Medicine

## 2022-12-10 ENCOUNTER — Encounter (HOSPITAL_COMMUNITY): Payer: Self-pay | Admitting: Internal Medicine

## 2022-12-10 ENCOUNTER — Ambulatory Visit (HOSPITAL_COMMUNITY)
Admission: RE | Admit: 2022-12-10 | Discharge: 2022-12-10 | Disposition: A | Discharge status: Home / Self Care | Payer: Medicare Other | Source: Ambulatory Visit | Attending: Internal Medicine | Admitting: Internal Medicine

## 2022-12-10 VITALS — BP 142/80 | HR 74 | Wt 258.8 lb

## 2022-12-10 DIAGNOSIS — Z7984 Long term (current) use of oral hypoglycemic drugs: Secondary | ICD-10-CM | POA: Insufficient documentation

## 2022-12-10 DIAGNOSIS — Z6839 Body mass index (BMI) 39.0-39.9, adult: Secondary | ICD-10-CM | POA: Insufficient documentation

## 2022-12-10 DIAGNOSIS — E1122 Type 2 diabetes mellitus with diabetic chronic kidney disease: Secondary | ICD-10-CM | POA: Diagnosis not present

## 2022-12-10 DIAGNOSIS — D6869 Other thrombophilia: Secondary | ICD-10-CM | POA: Diagnosis not present

## 2022-12-10 DIAGNOSIS — I129 Hypertensive chronic kidney disease with stage 1 through stage 4 chronic kidney disease, or unspecified chronic kidney disease: Secondary | ICD-10-CM | POA: Diagnosis not present

## 2022-12-10 DIAGNOSIS — Z7182 Exercise counseling: Secondary | ICD-10-CM | POA: Insufficient documentation

## 2022-12-10 DIAGNOSIS — Z7901 Long term (current) use of anticoagulants: Secondary | ICD-10-CM | POA: Diagnosis not present

## 2022-12-10 DIAGNOSIS — R0683 Snoring: Secondary | ICD-10-CM | POA: Insufficient documentation

## 2022-12-10 DIAGNOSIS — N1831 Chronic kidney disease, stage 3a: Secondary | ICD-10-CM | POA: Diagnosis not present

## 2022-12-10 DIAGNOSIS — E669 Obesity, unspecified: Secondary | ICD-10-CM | POA: Insufficient documentation

## 2022-12-10 DIAGNOSIS — I2510 Atherosclerotic heart disease of native coronary artery without angina pectoris: Secondary | ICD-10-CM | POA: Insufficient documentation

## 2022-12-10 DIAGNOSIS — I480 Paroxysmal atrial fibrillation: Secondary | ICD-10-CM | POA: Insufficient documentation

## 2022-12-10 NOTE — Progress Notes (Signed)
Primary Care Physician: Joe Gunner, MD Primary Cardiologist: Dr. Izora Stewart Primary Electrophysiologist: Dr. Ladona Stewart Referring Physician:    [name] is a 67 y.o. male with a history of moderate nonobstructive CAD, CKD stage IIIa, DM2, HTN, familial triglyceridemia, prior stroke s/p ILR placement, and paroxysmal atrial fibrillation who presents for consultation in the [facility]. He was evaluated by Dr. Ladona Stewart on 05/09/21 for cryptogenic stroke and had ILR placed. The patient was recently diagnosed with atrial fibrillation due to ILR alert demonstrating ~6 hr episode of Afib on 05/09/22 with max HR 130. Patient has a CHADS2VASC score of 6.  On evaluation 05/12/22, he is in SR today. ILR alert for new episode of Afib was for ~6 hours in duration and began at 2345 that evening. He states he did not feel that episode and was most likely asleep. He had won $1,300 earlier that evening playing blackjack in casino but did not feel any symptoms at that time. He is currently on ASA 81 mg daily and had a nosebleed 3 weeks ago for the first time which was odd to him. No more epistaxis since then. He does not drink alcohol. His brother and father were diagnosed with Afib; father is deceased. Some snoring with sleep but seems to have improved now that he is sleeping on his side.   On follow up 06/10/22, he is in SR today. ILR review through [employer] site today shows no new episodes of Afib since initial episode on 05/09/22. He feels well overall. He is compliant with Toprol and no missed doses of Eliquis.   On follow up 12/10/22, he is currently in NSR. ILR review through [employer] site today shows no new episodes of Afib since June. He feels well overall. He is compliant with Toprol and Eliquis. No new episodes of epistaxis.  Today, he denies symptoms of palpitations, chest pain, shortness of breath, orthopnea, PND, lower extremity edema, dizziness, presyncope, syncope,  snoring, daytime somnolence, bleeding, or neurologic sequela. The patient is tolerating medications without difficulties and is otherwise without complaint today.   Atrial Fibrillation Risk Factors:  he does not have symptoms or diagnosis of sleep apnea. he does not have a history of rheumatic fever. he does not have a history of alcohol use. The patient does have a history of early familial atrial fibrillation or other arrhythmias.  he has a BMI of Body mass index is 39.35 kg/m.Marland Kitchen Filed Weights   12/10/22 1042  Weight: 117.4 kg     Family History  Problem Relation Age of Onset   Diabetes Mother    COPD Mother    Cancer Mother        unknown type    Heart disease Father    COPD Sister    Healthy Brother    Heart disease Brother    Healthy Son    Colon cancer Neg Hx    Colon polyps Neg Hx    Esophageal cancer Neg Hx    Rectal cancer Neg Hx    Stomach cancer Neg Hx    Inflammatory bowel disease Neg Hx    Liver disease Neg Hx    Pancreatic cancer Neg Hx      Atrial Fibrillation Management history:  Previous antiarrhythmic drugs: None Previous cardioversions: None Previous ablations: None Anticoagulation history: Eliquis 5 mg BID   Past Medical History:  Diagnosis Date   Arthritis    Cataract    Mixed OU   Diabetes mellitus without complication (HCC)  Diabetic retinopathy (HCC)    NPDR OU   Hyperlipidemia    Hypertension    Hypertensive retinopathy    OU   Obesity    Stroke [facility])    Past Surgical History:  Procedure Laterality Date   BIOPSY  11/29/2019   Procedure: BIOPSY;  Surgeon: Joe Lofty., MD;  Location: [facility];  Service: Gastroenterology;;   COLONOSCOPY WITH PROPOFOL N/A 11/29/2019   Procedure: COLONOSCOPY WITH PROPOFOL;  Surgeon: Joe Lofty., MD;  Location: [facility];  Service: Gastroenterology;  Laterality: N/A;   ENDOSCOPIC MUCOSAL RESECTION N/A 11/29/2019   Procedure: ENDOSCOPIC MUCOSAL RESECTION;  Surgeon:  Joe Score Netty Starring., MD;  Location: [facility];  Service: Gastroenterology;  Laterality: N/A;   ESOPHAGOGASTRODUODENOSCOPY (EGD) WITH PROPOFOL N/A 11/29/2019   Procedure: ESOPHAGOGASTRODUODENOSCOPY (EGD) WITH PROPOFOL;  Surgeon: Joe Score Netty Starring., MD;  Location: [facility];  Service: Gastroenterology;  Laterality: N/A;   EYE SURGERY Left    had to have eye flushed after getting costic sodium in L eye   HEMOSTASIS CLIP PLACEMENT  11/29/2019   Procedure: HEMOSTASIS CLIP PLACEMENT;  Surgeon: Joe Lofty., MD;  Location: [facility];  Service: Gastroenterology;;   HERNIA REPAIR     HERNIA REPAIR     35 years ago   HOT HEMOSTASIS N/A 11/29/2019   Procedure: HOT HEMOSTASIS (ARGON PLASMA COAGULATION/BICAP);  Surgeon: Joe Lofty., MD;  Location: [facility];  Service: Gastroenterology;  Laterality: N/A;   JOINT REPLACEMENT  2021   Left knee replacement   LEFT HEART CATH AND CORONARY ANGIOGRAPHY N/A 04/11/2019   Procedure: LEFT HEART CATH AND CORONARY ANGIOGRAPHY;  Surgeon: Joe Decamp, MD;  Location: [facility];  Service: Cardiovascular;  Laterality: N/A;   POLYPECTOMY  11/29/2019   Procedure: POLYPECTOMY;  Surgeon: Joe Score Netty Starring., MD;  Location: [facility];  Service: Gastroenterology;;   RENAL ANGIOGRAPHY Bilateral 04/11/2019   Procedure: RENAL ANGIOGRAPHY;  Surgeon: Joe Decamp, MD;  Location: [facility] INVAS[facility] CV LAB;  Service: Cardiovascular;  Laterality: Bilateral;   SUBMUCOSAL LIFTING INJECTION  11/29/2019   Procedure: SUBMUCOSAL LIFTING INJECTION;  Surgeon: Joe Lofty., MD;  Location: [facility];  Service: Gastroenterology;;   TOTAL KNEE ARTHROPLASTY Left 08/01/2019   Procedure: LEFT TOTAL KNEE ARTHROPLASTY;  Surgeon: Joe Copa, MD;  Location: [facility] OR;  Service: Orthopedics;  Laterality: Left;    Current Outpatient Medications  Medication Sig Dispense Refill   amLODipine (NORVASC) 5 MG tablet Take 1 tablet (5 mg total)  by mouth daily. 90 tablet 3   apixaban (ELIQUIS) 5 MG TABS tablet TAKE 1 TABLET BY MOUTH TWICE A DAY 60 tablet 6   atorvastatin (LIPITOR) 80 MG tablet TAKE 1 TABLET BY MOUTH EVERY DAY 90 tablet 1   blood glucose meter kit and supplies KIT Dispense based on patient and insurance preference. Use up to four times daily as directed. 1 each 0   hydrochlorothiazide (HYDRODIURIL) 25 MG tablet TAKE 1 TABLET (25 MG TOTAL) BY MOUTH DAILY. 90 tablet 3   JARDIANCE 25 MG TABS tablet TAKE 1 TABLET (25 MG TOTAL) BY MOUTH DAILY. 30 tablet 2   losartan (COZAAR) 100 MG tablet TAKE 1 TABLET BY MOUTH EVERY DAY 90 tablet 0   metFORMIN (GLUCOPHAGE) 500 MG tablet Take 1 tablet (500 mg total) by mouth 2 (two) times daily with a meal. 180 tablet 3   metoprolol succinate (TOPROL-XL) 50 MG 24 hr tablet Take 1 tablet (50 mg total) by mouth daily. Take with or immediately following a meal.  90 tablet 3   No current facility-administered medications for this encounter.    No Known Allergies  ROS- All systems are reviewed and negative except as per the HPI above.  Physical Exam: Vitals:   12/10/22 1042  BP: (!) 142/80  Pulse: 74  SpO2: 100%  Weight: 117.4 kg    GEN- The patient is well appearing, alert and oriented x 3 today.   Neck - no JVD or carotid bruit noted Lungs- Clear to ausculation bilaterally, normal work of breathing Heart- Regular rate and rhythm, no murmurs, rubs or gallops, PMI not laterally displaced Extremities- no clubbing, cyanosis, or edema Skin - no rash or ecchymosis noted   Wt Readings from Last 3 Encounters:  12/10/22 117.4 kg  10/15/22 119.2 kg  09/17/22 118.8 kg    EKG today demonstrates: Vent. rate 74 BPM PR interval 224 ms QRS duration 140 ms QT/QTcB 416/461 ms P-R-T axes 11 -5 -11 Sinus rhythm with 1st degree A-V block Right bundle branch block Abnormal ECG When compared with ECG of 17-Sep-2022 08:08, PREVIOUS ECG IS PRESENT  Echo 02/20/21 demonstrated:  1. Moderate  concentric LVH with proximal septal thickening.   2. Left ventricular ejection fraction, by estimation, is 70 to 75%. The  left ventricle has hyperdynamic function. The left ventricle has no  regional wall motion abnormalities. There is moderate concentric left  ventricular hypertrophy. Left ventricular  diastolic parameters are consistent with Grade I diastolic dysfunction  (impaired relaxation). Elevated left atrial pressure.   3. Right ventricular systolic function is normal. The right ventricular  size is normal.   4. The mitral valve is normal in structure. No evidence of mitral valve  regurgitation. No evidence of mitral stenosis.   5. The aortic valve is tricuspid. Aortic valve regurgitation is not  visualized. Aortic valve sclerosis/calcification is present, without any  evidence of aortic stenosis.   6. The inferior vena cava is normal in size with greater than 50%  respiratory variability, suggesting right atrial pressure of 3 mmHg.    Epic records are reviewed at length today.  CHA2DS2-VASc Score = 6  The patient's score is based upon: CHF History: 0 HTN History: 1 Diabetes History: 1 Stroke History: 2 Vascular Disease History: 1 Age Score: 1 Gender Score: 0       ASSESSMENT AND PLAN: Paroxysmal Atrial Fibrillation (ICD10:  I48.0) The patient's CHA2DS2-VASc score is 6, indicating a 9.7% annual risk of stroke.    He is in SR today doing well. ILR review today shows no new episodes of Afib. At this time, we will proceed with conservative observation.  Continue Toprol 50 mg once daily.  2. Secondary Hypercoagulable State (ICD10:  D68.69) The patient is at significant risk for stroke/thromboembolism based upon his CHA2DS2-VASc Score of 6.  Start Apixaban (Eliquis).   Continue Eliquis 5 mg BID. No missed doses.    3. Obesity Body mass index is 39.35 kg/m. Lifestyle modification was discussed at length including regular exercise and weight reduction. Encouraged  daily walking   4. Snoring  with concern for Obstructive sleep apnea He defers sleep study at this time.   5. HTN Home readings are systolic 130s.    Follow up in 6 months Afib clinic.   Joe Bells, PA-C Afib Clinic [facility] [address] [place], [place] [address] [phone] 12/10/2022 11:16 AM

## 2022-12-14 ENCOUNTER — Ambulatory Visit: Payer: Medicare Other

## 2022-12-21 ENCOUNTER — Ambulatory Visit (INDEPENDENT_AMBULATORY_CARE_PROVIDER_SITE_OTHER): Payer: Medicare Other

## 2022-12-21 ENCOUNTER — Ambulatory Visit: Payer: Medicare Other

## 2022-12-21 DIAGNOSIS — I639 Cerebral infarction, unspecified: Secondary | ICD-10-CM | POA: Diagnosis not present

## 2022-12-21 LAB — CUP PACEART REMOTE DEVICE CHECK
Date Time Interrogation Session: 20241117232505
Implantable Pulse Generator Implant Date: 20230407

## 2022-12-31 ENCOUNTER — Other Ambulatory Visit: Payer: Self-pay | Admitting: Family Medicine

## 2022-12-31 DIAGNOSIS — N1831 Chronic kidney disease, stage 3a: Secondary | ICD-10-CM

## 2023-01-05 ENCOUNTER — Telehealth: Payer: Self-pay | Admitting: Family Medicine

## 2023-01-05 DIAGNOSIS — N1831 Chronic kidney disease, stage 3a: Secondary | ICD-10-CM

## 2023-01-05 NOTE — Telephone Encounter (Signed)
Prescription Request  01/05/2023  LOV: 10/15/2022  What is the name of the medication or equipment? Glipizide - 90 days supply  Have you contacted your pharmacy to request a refill? No   Which pharmacy would you like this sent to?  [id number] [place], [place] - [address] AT [facility] OF [address] [place] [address] Phone: [phone] Fax: [phone]    Patient notified that their request is being sent to the clinical staff for review and that they should receive a response within 2 business days.   Please advise at Mobile [phone] (mobile)

## 2023-01-05 NOTE — Telephone Encounter (Signed)
Spoke with patient and advised him that refill for glipizide was declined due to OV note back on 10/15/2022 "Discontinue Glipizide, start tirzepatide 5mg  once weekly. Monitor blood sugar levels and assess for GI side effects." Patient stated he never started injection and wanted to stay on glipizide instead. Please advise.

## 2023-01-08 MED ORDER — GLIPIZIDE 5 MG PO TABS: 5.0000 mg | ORAL_TABLET | Freq: Two times a day (BID) | ORAL | 3 refills | Status: DC

## 2023-01-08 NOTE — Telephone Encounter (Signed)
Patient is aware of annotation below and verbalized understanding.  

## 2023-01-14 ENCOUNTER — Ambulatory Visit: Payer: Medicare Other | Admitting: Family Medicine

## 2023-01-14 VITALS — BP 134/80 | HR 75 | Temp 97.1°F | Wt 261.0 lb

## 2023-01-14 DIAGNOSIS — R42 Dizziness and giddiness: Secondary | ICD-10-CM

## 2023-01-14 DIAGNOSIS — N1831 Chronic kidney disease, stage 3a: Secondary | ICD-10-CM

## 2023-01-14 DIAGNOSIS — E1159 Type 2 diabetes mellitus with other circulatory complications: Secondary | ICD-10-CM | POA: Diagnosis not present

## 2023-01-14 DIAGNOSIS — I872 Venous insufficiency (chronic) (peripheral): Secondary | ICD-10-CM | POA: Diagnosis not present

## 2023-01-14 DIAGNOSIS — Z6839 Body mass index (BMI) 39.0-39.9, adult: Secondary | ICD-10-CM

## 2023-01-14 DIAGNOSIS — E66812 Obesity, class 2: Secondary | ICD-10-CM

## 2023-01-14 DIAGNOSIS — Z7984 Long term (current) use of oral hypoglycemic drugs: Secondary | ICD-10-CM

## 2023-01-14 DIAGNOSIS — E1122 Type 2 diabetes mellitus with diabetic chronic kidney disease: Secondary | ICD-10-CM

## 2023-01-14 DIAGNOSIS — I480 Paroxysmal atrial fibrillation: Secondary | ICD-10-CM | POA: Diagnosis not present

## 2023-01-14 DIAGNOSIS — E1169 Type 2 diabetes mellitus with other specified complication: Secondary | ICD-10-CM | POA: Diagnosis not present

## 2023-01-14 DIAGNOSIS — I152 Hypertension secondary to endocrine disorders: Secondary | ICD-10-CM | POA: Diagnosis not present

## 2023-01-14 DIAGNOSIS — I83019 Varicose veins of right lower extremity with ulcer of unspecified site: Secondary | ICD-10-CM | POA: Insufficient documentation

## 2023-01-14 DIAGNOSIS — L97919 Non-pressure chronic ulcer of unspecified part of right lower leg with unspecified severity: Secondary | ICD-10-CM | POA: Insufficient documentation

## 2023-01-14 DIAGNOSIS — E785 Hyperlipidemia, unspecified: Secondary | ICD-10-CM

## 2023-01-14 LAB — POCT GLYCOSYLATED HEMOGLOBIN (HGB A1C): Hemoglobin A1C: 7.3 % — AB (ref 4.0–5.6)

## 2023-01-14 MED ORDER — GLIPIZIDE 10 MG PO TABS: 10.0000 mg | ORAL_TABLET | Freq: Two times a day (BID) | ORAL | 3 refills | Status: DC

## 2023-01-14 NOTE — Progress Notes (Signed)
Assessment/Plan:   Problem List Items Addressed This Visit       Cardiovascular and Mediastinum   Hypertension associated with diabetes (HCC)   Blood pressure well-controlled Plan: Continue current medications - amlodipine, hydrochlorothiazide, metoprolol, and losartan.      Relevant Medications   glipiZIDE (GLUCOTROL) 10 MG tablet   Paroxysmal atrial fibrillation (HCC)   Asymptomatic.  Continue metoprolol, Eliquis.  Follow-up cardiology      Venous ulcer of right lower extremity with varicose veins (HCC)   Venous dermatitis and small lesion on right leg, 1+ pitting edema Plan: Recommend use of compression stockings. Monitor lesion for signs of infection or worsening. Advise on skin care to manage dryness.        Endocrine   Type 2 diabetes mellitus with stage 3a chronic kidney disease, without long-term current use of insulin (HCC) - Primary   Worsen. Increase glipizide to 10 mg twice a day.  Continue metformin Jardiance.  Monitor blood sugar levels.  Emphasize low-carb diet and exercise.      Relevant Medications   glipiZIDE (GLUCOTROL) 10 MG tablet   Other Relevant Orders   POCT glycosylated hemoglobin (Hb A1C) (Completed)   Hyperlipidemia associated with type 2 diabetes mellitus (HCC)   Continue atorvastatin.      Relevant Medications   glipiZIDE (GLUCOTROL) 10 MG tablet     Other   Class 2 severe obesity with serious comorbidity in adult [facility])   Encourage the patient to pursue weight reduction through increased physical activity and dietary changes.       Relevant Medications   glipiZIDE (GLUCOTROL) 10 MG tablet   Dizzy spells   Likely orthostatic hypotension  Reports occasional dizziness upon standing quickly Plan: Advise patient to stand up slowly from seated or lying positions. Encourage hydration. Consider compression stockings. Monitor symptoms.      Other Visit Diagnoses       Venous stasis dermatitis           Medications Discontinued  During This Encounter  Medication Reason   glipiZIDE (GLUCOTROL) 5 MG tablet     Return in about 3 months (around 04/14/2023) for BP, DM, HLD.    Subjective:   Encounter date: 01/14/2023  Joe Stewart is a 67 y.o. male who has Hypertension associated with diabetes (HCC); Abnormal nuclear stress test; Type 2 diabetes mellitus with stage 3a chronic kidney disease, without long-term current use of insulin (HCC); Arthritis of left knee; Cecal polyp; History of colon polyps; Abnormal colonoscopy; Anemia; Hyperlipidemia associated with type 2 diabetes mellitus (HCC); Snores; History of ischemic stroke; Familial hypercholesterolemia; Coronary artery disease involving native coronary artery of native heart without angina pectoris; Class 2 severe obesity with serious comorbidity in adult [facility]); Epistaxis; Paroxysmal atrial fibrillation (HCC); Hypercoagulable state due to paroxysmal atrial fibrillation (HCC); LVH (left ventricular hypertrophy); Dizzy spells; and Venous ulcer of right lower extremity with varicose veins (HCC) on their problem list..   He  has a past medical history of Arthritis, Cataract, Diabetes mellitus without complication (HCC), Diabetic retinopathy (HCC), Hyperlipidemia, Hypertension, Hypertensive retinopathy, Obesity, and Stroke (HCC)..   Chief Complaint: Follow-up visit for diabetes management.  History of Present Illness:  Diabetes Mellitus Type 2. The patient  presents for a follow-up visit on his diabetes management. His hemoglobin A1C has increased from 6.9% to 7.3%. He reports not starting tirzepatide as previously planned and has continued on glipizide. He denies any intolerance to glipizide and agrees to increase the dose for better glycemic control. He reports  occasional dizziness upon standing quickly but denies chest pain, shortness of breath, excessive thirst, or frequent urination.  Hypertension. Blood pressure readings are satisfactory, with today's reading at  134/80 mmHg. Currently on amlodipine, hydrochlorothiazide, metoprolol, and losartan. No new symptoms reported.  Venous Insufficiency. Patient reports a small lesion on his right leg and dry skin bilaterally. Physical examination reveals venous dermatitis bilaterally with 1+ pitting edema. No signs of drainage or erythema noted.  Review of Systems:   Constitutional Feeling generally well Cardiovascular No chest pain, no palpitations Respiratory No issues reported Gastrointestinal Occasional GI discomfort with metformin but tolerable Genitourinary No excessive thirst or frequent urination Neurological Reports occasional lightheadedness upon standing Musculoskeletal Occasional leg swelling, lesion on right leg Skin Dry skin, venous dermatitis Endocrinologic denies polyuria polydipsia  Past Surgical History:  Procedure Laterality Date   BIOPSY  11/29/2019   Procedure: BIOPSY;  Surgeon: Lemar Lofty., MD;  Location: [facility];  Service: Gastroenterology;;   COLONOSCOPY WITH PROPOFOL N/A 11/29/2019   Procedure: COLONOSCOPY WITH PROPOFOL;  Surgeon: Lemar Lofty., MD;  Location: [facility];  Service: Gastroenterology;  Laterality: N/A;   ENDOSCOPIC MUCOSAL RESECTION N/A 11/29/2019   Procedure: ENDOSCOPIC MUCOSAL RESECTION;  Surgeon: Meridee Score Netty Starring., MD;  Location: [facility];  Service: Gastroenterology;  Laterality: N/A;   ESOPHAGOGASTRODUODENOSCOPY (EGD) WITH PROPOFOL N/A 11/29/2019   Procedure: ESOPHAGOGASTRODUODENOSCOPY (EGD) WITH PROPOFOL;  Surgeon: Meridee Score Netty Starring., MD;  Location: [facility];  Service: Gastroenterology;  Laterality: N/A;   EYE SURGERY Left    had to have eye flushed after getting costic sodium in L eye   HEMOSTASIS CLIP PLACEMENT  11/29/2019   Procedure: HEMOSTASIS CLIP PLACEMENT;  Surgeon: Lemar Lofty., MD;  Location: [facility];  Service: Gastroenterology;;   HERNIA REPAIR     HERNIA REPAIR     35 years ago    HOT HEMOSTASIS N/A 11/29/2019   Procedure: HOT HEMOSTASIS (ARGON PLASMA COAGULATION/BICAP);  Surgeon: Lemar Lofty., MD;  Location: [facility];  Service: Gastroenterology;  Laterality: N/A;   JOINT REPLACEMENT  2021   Left knee replacement   LEFT HEART CATH AND CORONARY ANGIOGRAPHY N/A 04/11/2019   Procedure: LEFT HEART CATH AND CORONARY ANGIOGRAPHY;  Surgeon: Yates Decamp, MD;  Location: [facility];  Service: Cardiovascular;  Laterality: N/A;   POLYPECTOMY  11/29/2019   Procedure: POLYPECTOMY;  Surgeon: Meridee Score Netty Starring., MD;  Location: [facility];  Service: Gastroenterology;;   RENAL ANGIOGRAPHY Bilateral 04/11/2019   Procedure: RENAL ANGIOGRAPHY;  Surgeon: Yates Decamp, MD;  Location[facility] CV [facility];  Service: Cardiovascular;  Laterality: Bilateral;   SUBMUCOSAL LIFTING INJECTION  11/29/2019   Procedure: SUBMUCOSAL LIFTING INJECTION;  Surgeon: Lemar Lofty., MD;  Location: [facility];  Service: Gastroenterology;;   TOTAL KNEE ARTHROPLASTY Left 08/01/2019   Procedure: LEFT TOTAL KNEE ARTHROPLASTY;  Surgeon: Cammy Copa, MD;  Location[facility];  Service: Orthopedics;  Laterality: Left;    Outpatient Medications Prior to Visit  Medication Sig Dispense Refill   amLODipine (NORVASC) 5 MG tablet Take 1 tablet (5 mg total) by mouth daily. 90 tablet 3   apixaban (ELIQUIS) 5 MG TABS tablet TAKE 1 TABLET BY MOUTH TWICE A DAY 60 tablet 6   atorvastatin (LIPITOR) 80 MG tablet TAKE 1 TABLET BY MOUTH EVERY DAY 90 tablet 1   blood glucose meter kit and supplies KIT Dispense based on patient and insurance preference. Use up to four times daily as directed. 1 each 0   hydrochlorothiazide (HYDRODIURIL) 25 MG  tablet TAKE 1 TABLET (25 MG TOTAL) BY MOUTH DAILY. 90 tablet 3   JARDIANCE 25 MG TABS tablet TAKE 1 TABLET (25 MG TOTAL) BY MOUTH DAILY. 30 tablet 2   losartan (COZAAR) 100 MG tablet TAKE 1 TABLET BY MOUTH EVERY DAY 90 tablet 0   metFORMIN (GLUCOPHAGE) 500  MG tablet Take 1 tablet (500 mg total) by mouth 2 (two) times daily with a meal. 180 tablet 3   metoprolol succinate (TOPROL-XL) 50 MG 24 hr tablet Take 1 tablet (50 mg total) by mouth daily. Take with or immediately following a meal. 90 tablet 3   glipiZIDE (GLUCOTROL) 5 MG tablet Take 1 tablet (5 mg total) by mouth 2 (two) times daily before a meal. 180 tablet 3   No facility-administered medications prior to visit.    Family History  Problem Relation Age of Onset   Diabetes Mother    COPD Mother    Cancer Mother        unknown type    Heart disease Father    COPD Sister    Healthy Brother    Heart disease Brother    Healthy Son    Colon cancer Neg Hx    Colon polyps Neg Hx    Esophageal cancer Neg Hx    Rectal cancer Neg Hx    Stomach cancer Neg Hx    Inflammatory bowel disease Neg Hx    Liver disease Neg Hx    Pancreatic cancer Neg Hx     Social History   Socioeconomic History   Marital status: Single    Spouse name: Not on file   Number of children: 1   Years of education: Not on file   Highest education level: Not on file  Occupational History   Not on file  Tobacco Use   Smoking status: Never   Smokeless tobacco: Never  Vaping Use   Vaping status: Never Used  Substance and Sexual Activity   Alcohol use: Not Currently   Drug use: Never   Sexual activity: Not Currently  Other Topics Concern   Not on file  Social History Narrative   Not on file   Social Drivers of Health   Financial Resource Strain: Low Risk  (06/23/2022)   Overall Financial Resource Strain (CARDIA)    Difficulty of Paying Living Expenses: Not hard at all  Food Insecurity: No Food Insecurity (06/23/2022)   Hunger Vital Sign    Worried About Running Out of Food in the Last Year: Never true    Ran Out of Food in the Last Year: Never true  Transportation Needs: No Transportation Needs (06/23/2022)   PRAPARE - Administrator, Civil Service (Medical): No    Lack of Transportation  (Non-Medical): No  Physical Activity: Insufficiently Active (06/23/2022)   Exercise Vital Sign    Days of Exercise per Week: 2 days    Minutes of Exercise per Session: 30 min  Stress: No Stress Concern Present (05/28/2021)   [employer] of Occupational Health - Occupational Stress Questionnaire    Feeling of Stress : Only a little  Social Connections: Unknown (06/23/2022)   Social Connection and Isolation Panel [NHANES]    Frequency of Communication with Friends and Family: More than three times a week    Frequency of Social Gatherings with Friends and Family: Three times a week    Attends Religious Services: Not on file    Active Member of Clubs or Organizations: No    Attends Club  or Organization Meetings: Never    Marital Status: Divorced  Catering manager Violence: Not At Risk (05/28/2021)   Humiliation, Afraid, Rape, and Kick questionnaire    Fear of Current or Ex-Partner: No    Emotionally Abused: No    Physically Abused: No    Sexually Abused: No                                                                                                  Objective:  Physical Exam: BP 134/80 (BP Location: Left Arm, Patient Position: Sitting, Cuff Size: Large)   Pulse 75   Temp (!) 97.1 F (36.2 C) (Temporal)   Wt 261 lb (118.4 kg)   BMI 39.68 kg/m     Physical Exam Constitutional:      Appearance: Normal appearance.  HENT:     Head: Normocephalic and atraumatic.     Right Ear: Hearing normal.     Left Ear: Hearing normal.     Nose: Nose normal.  Eyes:     General: No scleral icterus.       Right eye: No discharge.        Left eye: No discharge.     Extraocular Movements: Extraocular movements intact.  Cardiovascular:     Rate and Rhythm: Normal rate and regular rhythm.     Heart sounds: Normal heart sounds.  Pulmonary:     Effort: Pulmonary effort is normal.     Breath sounds: Normal breath sounds.  Abdominal:     Palpations: Abdomen is soft.     Tenderness:  There is no abdominal tenderness.  Musculoskeletal:     Right lower leg: 1+ Pitting Edema present.     Left lower leg: 1+ Pitting Edema present.  Skin:    General: Skin is warm.     Findings: Rash and wound (Right lower ankle 1 cm ulcer, well-healing without signs of infection) present. Rash is scaling (Bilateral lower extremities).  Neurological:     General: No focal deficit present.     Mental Status: He is alert.     Cranial Nerves: No cranial nerve deficit.  Psychiatric:        Mood and Affect: Mood normal.        Behavior: Behavior normal.        Thought Content: Thought content normal.        Judgment: Judgment normal.     CUP PACEART REMOTE DEVICE CHECK Result Date: 12/21/2022 ILR summary report received. Battery status OK. Normal device function. No new symptom, tachy, brady, or pause episodes. 1 new AF episode on 12/12/22 at 23:48, 4 hrs 24 min, V rates > 110 bpm ~ 15%, on Eliquis and metoprolol per Epic. Monthly summary reports and ROV/PRN. [facility], [facility]  CUP PACEART REMOTE DEVICE CHECK Result Date: 11/16/2022 ILR summary report received. Battery status OK. Normal device function. No new symptom, tachy, brady, or pause episodes. No new AF episodes. Monthly summary reports and ROV/PRN - [facility], CV[facility]  US Abdomen Limited RUQ (LIVER/GB) Result Date: 10/31/2022 CLINICAL DATA:  Elevated LFTs EXAM: ULTRASOUND ABDOMEN LIMITED RIGHT UPPER QUADRANT  COMPARISON:  None Available. FINDINGS: Gallbladder: The gallbladder is contracted with mild wall thickening measuring up to 5 mm. Negative sonographic Murphy's sign. No pericholecystic fluid. Small amount of sludge in the gallbladder lumen. Common bile duct: Diameter: 4.5 mm Liver: Increased echogenicity. No focal lesion. Portal vein is patent on color Doppler imaging with normal direction of blood flow towards the liver. Other: None. IMPRESSION: 1. Increased hepatic parenchymal echogenicity suggestive of steatosis. 2. Small amount of sludge in the  gallbladder lumen. The gallbladder is contracted with mild wall thickening. No pericholecystic fluid or sonographic Murphy's sign. Electronically Signed   By: Annia Belt M.D.   On: 10/31/2022 11:02    Recent Results (from the past 2160 hours)  Comp Met (CMET)     Status: Abnormal   Collection Time: 10/23/22  8:20 AM  Result Value Ref Range   Sodium 139 135 - 145 mEq/L   Potassium 4.4 3.5 - 5.1 mEq/L   Chloride 104 96 - 112 mEq/L   CO2 25 19 - 32 mEq/L   Glucose, Bld 131 (H) 70 - 99 mg/dL   BUN 26 (H) 6 - 23 mg/dL   Creatinine, Ser 8.29 0.40 - 1.50 mg/dL   Total Bilirubin 0.8 0.2 - 1.2 mg/dL   Alkaline Phosphatase 130 (H) 39 - 117 U/L   AST 63 (H) 0 - 37 U/L   ALT 60 (H) 0 - 53 U/L   Total Protein 7.4 6.0 - 8.3 g/dL   Albumin 4.1 3.5 - 5.2 g/dL   GFR 56.21 >30.86 mL/min    Comment: Calculated using the CKD-EPI Creatinine Equation (2021)   Calcium 9.0 8.4 - 10.5 mg/dL  Acute Hep Panel & Hep B Surface Ab     Status: None   Collection Time: 10/26/22  3:06 PM  Result Value Ref Range   Hep A IgM NON-REACTIVE NON-REACTIVE   Hepatitis B Surface Ag NON-REACTIVE NON-REACTIVE   Hep B C IgM NON-REACTIVE NON-REACTIVE   HEPATITIS C ANTIBODY REFILL$(REFL) NON-REACTIVE NON-REACTIVE    Comment: . HCV antibody was non-reactive. There is no laboratory  evidence of HCV infection. . In most cases, no further action is required. However, if recent HCV exposure is suspected, a test for HCV RNA (test code 57846) is suggested. . For additional information please refer to http://education.[facility].com/faq/FAQ22v1 (This link is being provided for informational/ educational purposes only.) . Marland Kitchen For additional information, please refer to  http://education.[facility].com/faq/FAQ202  (This link is being provided for informational/ educational purposes only.) .   Gamma GT     Status: Abnormal   Collection Time: 10/26/22  3:06 PM  Result Value Ref Range   GGT 202 (H) 7 - 51 U/L   FIB-4 w/Rx NASH FibroSure Plus     Status: Abnormal   Collection Time: 10/26/22  3:06 PM  Result Value Ref Range   AST 80 (H) 0 - 40 IU/L   ALT 72 (H) 0 - 44 IU/L   Platelets 210 150 - 450 x10E3/uL   FIB-4 Index 3.10 (H) 0.00 - 2.67    Comment:       0.00 - 1.29 Low risk for advanced liver fibrosis       1.30 - 2.67 Indeterminate risk for advanced liver                   fibrosis             >2.67 High risk for advanced fibrosis and  for the development of other liver                   related events   NASH FibroSure(R) Plus     Status: Abnormal   Collection Time: 10/26/22  3:06 PM  Result Value Ref Range   Fibrosis Score 0.72 (H) 0.00 - 0.21   Fibrosis Stage F3-F4    Steatosis Score 0.82 (H) 0.00 - 0.40   Steatosis Grade Comment     Comment:                 S2 - S3  Moderate to Severe Steatosis                          (Clinically Significant) (34-100%)    NASH Score 0.93 (H) 0.00 - 0.25   NASH Grade Comment     Comment:                    N3 - Severe NASH   Methodology: Comment     Comment: The analytes tested are performed by FibroSure-Specific methods. Not intended for use with other diagnostic considerations.    ALPHA 2-MACROGLOBULINS, QN 360 (H) 110 - 276 mg/dL   Haptoglobin 914 32 - 363 mg/dL   Apolipoprotein A-1 782 (L) 101 - 178 mg/dL   Bilirubin, Total 0.2 0.0 - 1.2 mg/dL   GGT 956 (H) 0 - 65 IU/L   ALT (SGPT) P5P 86 (H) 0 - 55 IU/L   AST (SGOT) P5P 99 (H) 0 - 40 IU/L   Cholesterol, Total 122 100 - 199 mg/dL   Glucose 213 (H) 70 - 99 mg/dL   Triglycerides 086 (H) 0 - 149 mg/dL   Interpretations: Comment     Comment: Quantitative results of 10 biochemicals in combination with age and gender, are analyzed using a computational algorithm to provide a quantitative surrogate marker (0.0-1.0) of liver fibrosis (Metavir F0-F4), hepatic steatosis (0.0-1.0, S0-S3), and Non-Alcoholic Steato-Hepatitis (NASH) (0.0-1.0, N0-N3). The absence of  steatosis (S<0.40) precludes the diagnosis of NASH. Fibrosis marker: In a study of 171 Non-Alcoholic Fatty Liver Disease (NAFLD) patients where 23% had significant NAFLD fibrosis (Metavir F2-F4) and 11% had cirrhosis by liver biopsy, a fibrosis result of >0.3 yielded a sensitivity of 83% and a specificity of 78% for the detection of significant fibrosis.[1] Steatosis marker: In a population of 2997 patients, where 61% had significant steatosis (>=5%) on a liver biopsy, a steatosis score >0.4 had a sensitivity of 79% and a specificity of 50% for identification of significant steatosis.[2] NASH marker: In a population of 1081 NAFLD patients, where 51% had at  least some NASH by liver biopsy, a prediction of NASH had a sensitivity of 72% for identifying NASH and a specificity of 71%.[3]    Fibrosis Scoring: Comment     Comment:      <=0.21 = Stage F0 - No fibrosis 0.21 - 0.27 = Stage F0 - F1 0.27 - 0.31 = Stage F1 - Portal fibrosis 0.31 - 0.48 = Stage F1 - F2 0.48 - 0.58 = Stage F2 - Bridging fibrosis with few septa 0.58 - 0.72 = Stage F3 - Bridging fibrosis with many septa 0.72 - 0.74 = Stage F3 - F4       >0.74 = Stage F4 - Cirrhosis    Steatosis Scoring Comment     Comment:      <=0.40 = S0  - No Steatosis (<5%) 0.40 -  0.55 = S1  - Mild Steatosis                     (but Clinically Significant) (5-33%)       >0.55 = S2S3- Moderate to Severe Steatosis                     (Clinically Significant) (34-100%)    NASH Scoring Comment     Comment:      <=0.25 = N0 - No NASH 0.25 - 0.50 = N1 - Mild NASH 0.50 - 0.75 = N2 - Moderate NASH       >0.75 = N3 - Severe NASH    Limitations: Comment     Comment: NASH FibroSure(R) Plus is recommended for patients with suspected non-alcoholic fatty liver disease. It is not recommended for patients with other liver diseases. It is also not recommended in patients with [facility] Disease, acute hemolysis, acute viral hepatitis, drug induced  hepatitis, genetic liver disease, autoimmune hepatitis and/or extra-hepatic cholestasis. Any of these clinical situations may lead to inaccurate quantitative predictions of fibrosis.    Comment: Comment     Comment: This test was developed and its performance characteristics determined by [facility]. It has not been cleared or approved by the Food and Drug Administration. For questions regarding this report please contact customer service at [phone]. References: 1.  Ratziu V. et al. Diagnostic Value of Biochemical Markers (FibroTest) for the prediction of Liver Fibrosis in patients with Non-Alcoholic Fatty Liver Disease. [facility] 2006; 6:6. 2.  Poynard T. et al. The Diagnostic Performance of a Simplified Blood Test (SteatoTest-2) for the Prediction of Liver Steatosis. Eur J Gastroenterol Hepatol. 2019; 34:742-595. 3.  Poynard T. et al. Diagnostic performance of a new noninvasive test for nonalcoholic steatohepatitis using a simplified histological reference. Eur J Gastroenterol Hepatol. 2018 May; 30:569-577.   REFLEX TIQ     Status: None   Collection Time: 10/26/22  3:06 PM  Result Value Ref Range   REFLEX TIQ      Comment: OUR RECORDS INDICATE THAT YOU HAVE ORDERED ACUTE HEP PNL (REFL) ORDER CODE [id number].  THIS IS A REFLEX-SPECIFIC ORDER CODE. HOWEVER, ONLY THE INITIAL TEST WAS PERFORMED, BECAUSE WE DO NOT HAVE A REFLEX TESTING AUTHORIZATION FORM ON FILE FOR YOU.  TO  PERFORM A REFLEX TEST WE NEED YOU TO SIGN AN AUTHORIZATION FORM  SPECIFYING (A) THE REFLEXIVE TEST AND (B) THE RESULTS THAT WILL  TRIGGER THE PERFORMANCE OF THE REFLEX TEST.  PLEASE CONTACT A  CLIENT SERVICE REPRESENTATIVE AT [facility] IF YOU WOULD  LIKE ADDITIONAL TESTING DONE OR CONTACT YOUR SALES REPRESENTATIVE  TO OBTAIN A COPY OF THE REFLEXIVE TESTING AUTHORIZATION FORM. . .   CUP PACEART REMOTE DEVICE CHECK     Status: None   Collection Time: 11/15/22 11:04 PM  Result Value Ref  Range   Date Time Interrogation Session [id number]    Pulse Generator Manufacturer MERM    Pulse Gen Model LNQ22 LINQ II    Pulse Gen Serial Number [id number]    Clinic [facility]    Implantable Pulse Generator Type ICM/ILR    Implantable Pulse Generator Implant Date [id number]   CUP PACEART REMOTE DEVICE CHECK     Status: None   Collection Time: 12/20/22 11:25 PM  Result Value Ref Range   Date Time Interrogation Session [phone]    Pulse Generator Manufacturer MERM    Pulse Gen Model LNQ22 LINQ II    Pulse  Gen Serial Number [id number]    Clinic Name [facility]    Implantable Pulse Generator Type ICM/ILR    Implantable Pulse Generator Implant Date [id number]   POCT glycosylated hemoglobin (Hb A1C)     Status: Abnormal   Collection Time: 01/14/23  8:19 AM  Result Value Ref Range   Hemoglobin A1C 7.3 (A) 4.0 - 5.6 %   HbA1c POC (<> result, manual entry)     HbA1c, POC (prediabetic range)     HbA1c, POC (controlled diabetic range)          Garner Nash, MD, MS

## 2023-01-14 NOTE — Assessment & Plan Note (Signed)
Venous dermatitis and small lesion on right leg, 1+ pitting edema Plan: Recommend use of compression stockings. Monitor lesion for signs of infection or worsening. Advise on skin care to manage dryness.

## 2023-01-14 NOTE — Assessment & Plan Note (Signed)
Continue atorvastatin

## 2023-01-14 NOTE — Assessment & Plan Note (Signed)
Worsen. Increase glipizide to 10 mg twice a day.  Continue metformin Jardiance.  Monitor blood sugar levels.  Emphasize low-carb diet and exercise.

## 2023-01-14 NOTE — Assessment & Plan Note (Signed)
Encourage the patient to pursue weight reduction through increased physical activity and dietary changes.

## 2023-01-14 NOTE — Assessment & Plan Note (Signed)
Asymptomatic.  Continue metoprolol, Eliquis.  Follow-up cardiology

## 2023-01-14 NOTE — Assessment & Plan Note (Signed)
Blood pressure well-controlled Plan: Continue current medications - amlodipine, hydrochlorothiazide, metoprolol, and losartan.

## 2023-01-14 NOTE — Assessment & Plan Note (Signed)
Likely orthostatic hypotension  Reports occasional dizziness upon standing quickly Plan: Advise patient to stand up slowly from seated or lying positions. Encourage hydration. Consider compression stockings. Monitor symptoms.

## 2023-01-15 NOTE — Progress Notes (Signed)
[facility] Summary Report / Loop Recorder 

## 2023-01-18 ENCOUNTER — Ambulatory Visit: Payer: Medicare Other

## 2023-01-25 ENCOUNTER — Ambulatory Visit (INDEPENDENT_AMBULATORY_CARE_PROVIDER_SITE_OTHER): Payer: Medicare Other

## 2023-01-25 DIAGNOSIS — I639 Cerebral infarction, unspecified: Secondary | ICD-10-CM

## 2023-01-25 LAB — CUP PACEART REMOTE DEVICE CHECK
Date Time Interrogation Session: 20241222231416
Implantable Pulse Generator Implant Date: 20230407

## 2023-02-09 ENCOUNTER — Telehealth: Payer: Self-pay

## 2023-02-09 NOTE — Telephone Encounter (Signed)
 Copied from CRM [id number]. Topic: Clinical - Prescription Issue >> Feb 09, 2023  9:56 AM Benton O wrote: Reason for CRM: patient calling about apixaban  (ELIQUIS ) 5 MG TABS tablet its jumped from like 50 some dollars and then went into a donut hole at 160 and now at the beginning of the year now its saying 300 dollars and dont know why its jumped so high and I got the same insurance. Please reach out to patient concerning this [phone]. If it is that amount could I go to a alternative

## 2023-02-09 NOTE — Telephone Encounter (Signed)
 Spoke with patient and advised him to reach out to his insurance or pharmacy to be sure his formulary hasn't changed with the new year. Patient stated he would reach out to the pharmacy and call or send [place] a mychart.

## 2023-02-15 IMAGING — MR MR HEAD W/O CM
12 of 13 series · 44 of 48 positions shown · IV contrast (gadavist)
Comparison: No prior MRI or MRA. Correlation is made with CT head
02/19/2021

CLINICAL DATA: Stroke, TIA, determine embolic source; headache and
vision impairment with 2 days of blurred vision



[Series 5: DWI · axial · 3.0mm · 0.92mm/px · z∈[-100,+55]mm · 9 of 106 slices shown (1 of 4)]
[im 1/106]
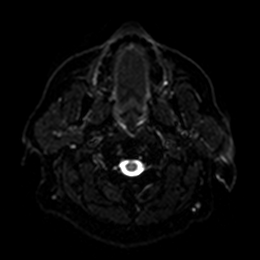
[im 14/106]
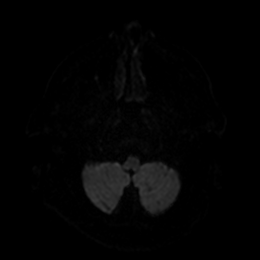
[im 27/106]
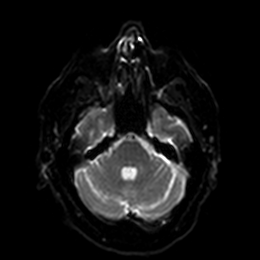
[im 40/106]
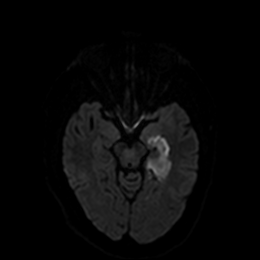
[im 53/106]
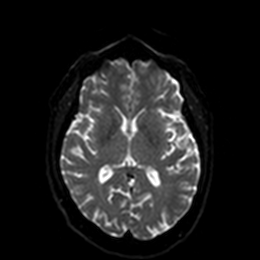
[im 66/106]
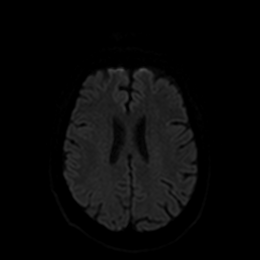
[im 79/106]
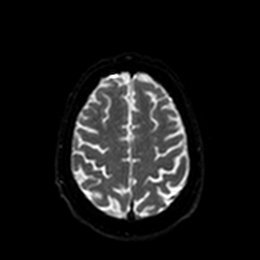
[im 92/106]
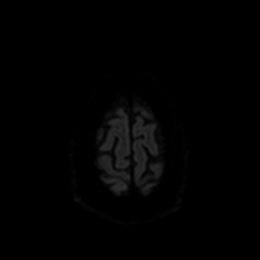
[im 106/106]
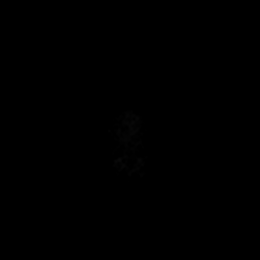

[Series 6: DWI · axial · 3.0mm · 0.92mm/px · z∈[-100,+55]mm · 4 of 53 slices shown (2 of 4)]
[im 1/53]
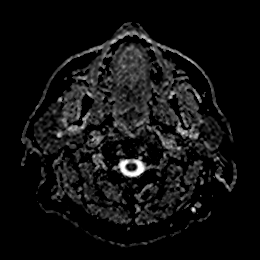
[im 18/53]
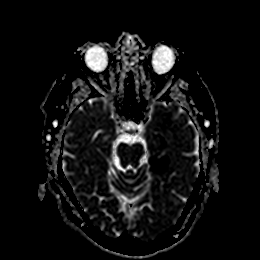
[im 35/53]
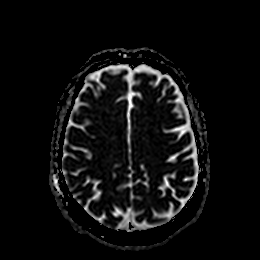
[im 53/53]
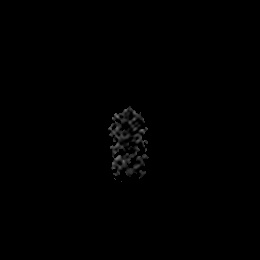

[Series 7: DWI · coronal · 4.0mm · 0.88mm/px · 5 of 68 slices shown (3 of 4)]
[im 1/68]
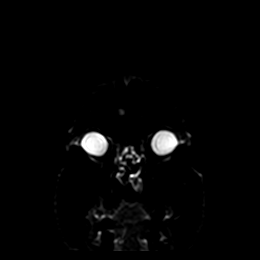
[im 17/68]
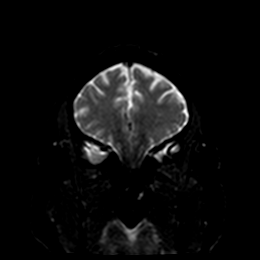
[im 34/68]
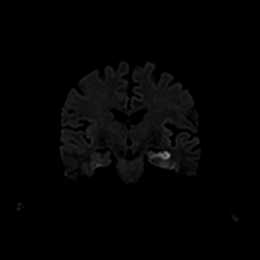
[im 51/68]
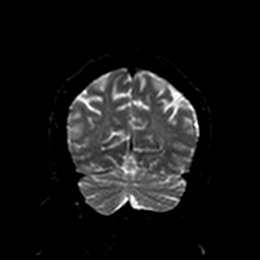
[im 68/68]
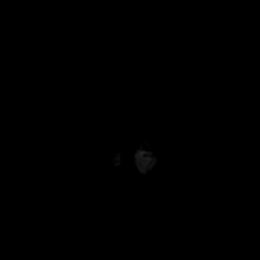

[Series 8: DWI · coronal · 4.0mm · 0.88mm/px · 3 of 34 slices shown (4 of 4)]
[im 1/34]
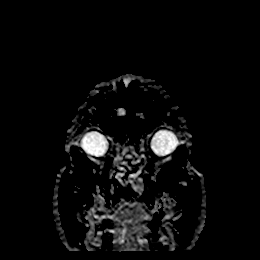
[im 17/34]
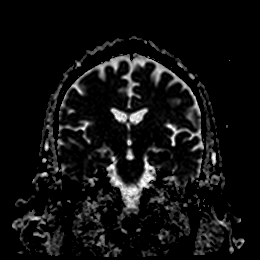
[im 34/34]
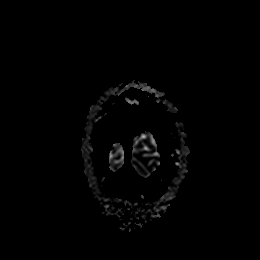

[Series 9: T1 · sagittal · 5.0mm · 0.75mm/px · 2 of 25 slices shown]
[im 1/25]
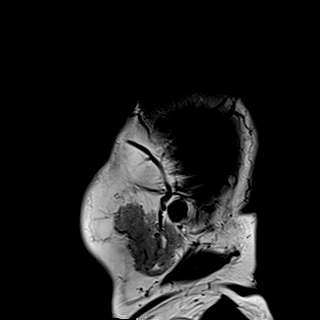
[im 25/25]
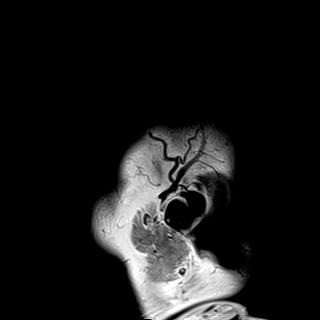

[Series 10: T2 · axial · 5.0mm · 0.72mm/px · z∈[-100,+56]mm · 2 of 27 slices shown (1 of 2)]
[im 1/27]
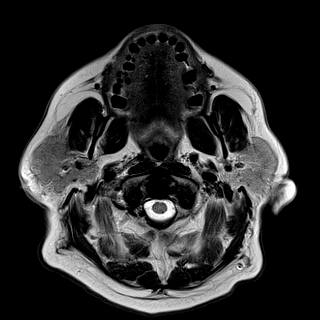
[im 27/27]
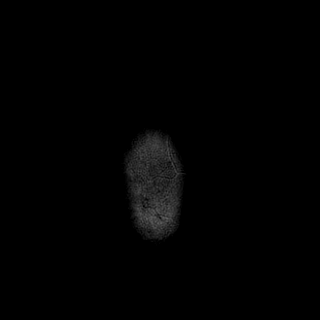

[Series 11: FLAIR · axial · 5.0mm · 0.45mm/px · z∈[-99,+57]mm · 2 of 27 slices shown]
[im 1/27]
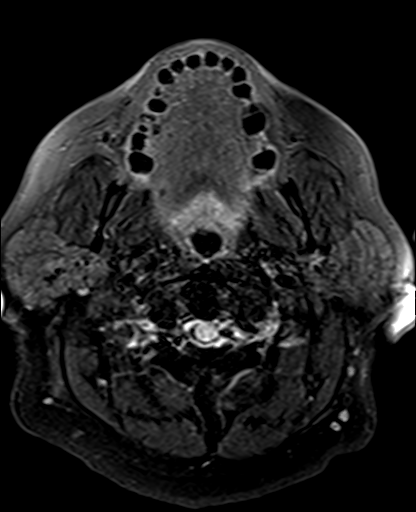
[im 27/27]
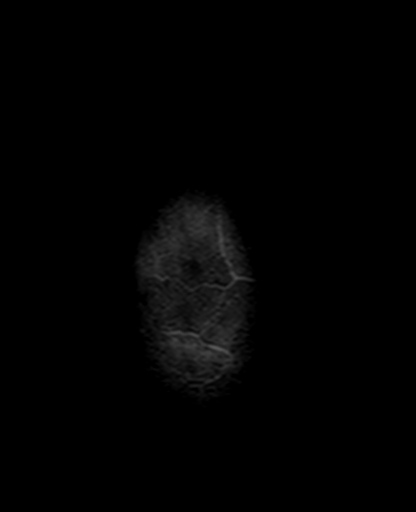

[Series 12: mag_images · axial · 3.0mm · 0.90mm/px · z∈[-97,+55]mm · 4 of 52 slices shown]
[im 1/52]
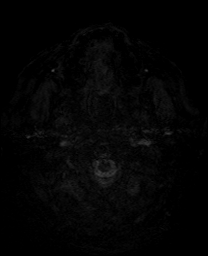
[im 18/52]
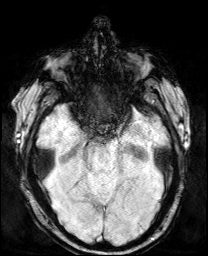
[im 35/52]
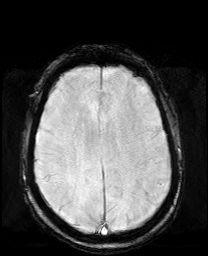
[im 52/52]
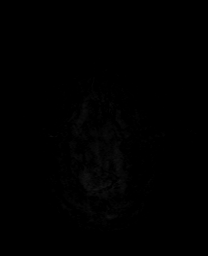

[Series 13: pha_images · axial · 3.0mm · 0.90mm/px · z∈[-97,+55]mm · 4 of 52 slices shown]
[im 1/52]
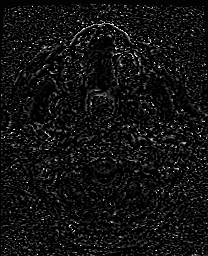
[im 18/52]
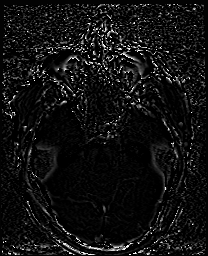
[im 35/52]
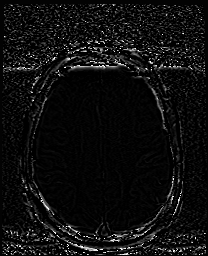
[im 52/52]
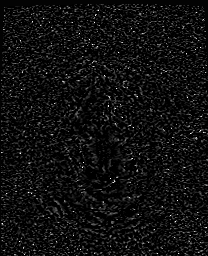

[Series 14: swi_images · axial · 3.0mm · 0.90mm/px · z∈[-97,+55]mm · 4 of 52 slices shown]
[im 1/52]
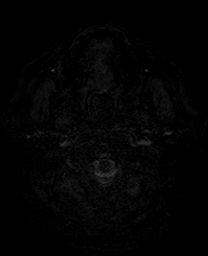
[im 18/52]
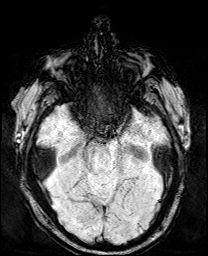
[im 35/52]
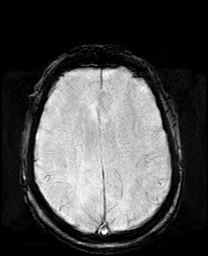
[im 52/52]
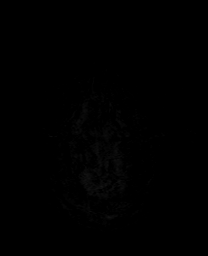

[Series 15: mip_images(sw) · axial · 24.0mm · 0.90mm/px · z∈[-87,+45]mm · 3 of 45 slices shown]
[im 1/45]
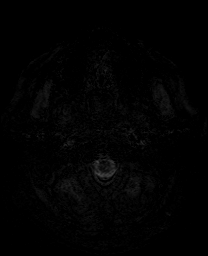
[im 23/45]
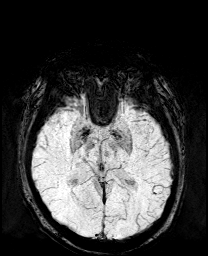
[im 45/45]
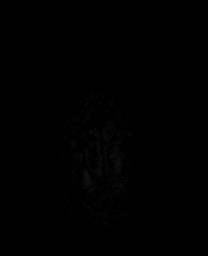

[Series 17: T2 · coronal · 5.0mm · 0.34mm/px · 2 of 29 slices shown (2 of 2)]
[im 1/29]
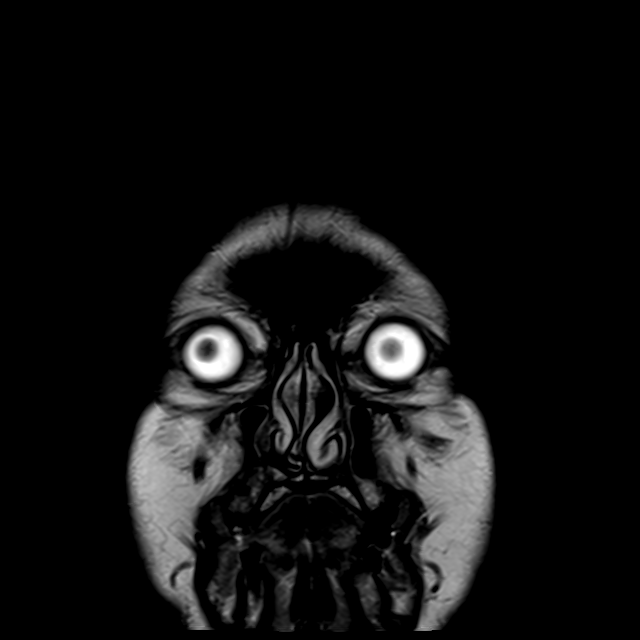
[im 29/29]
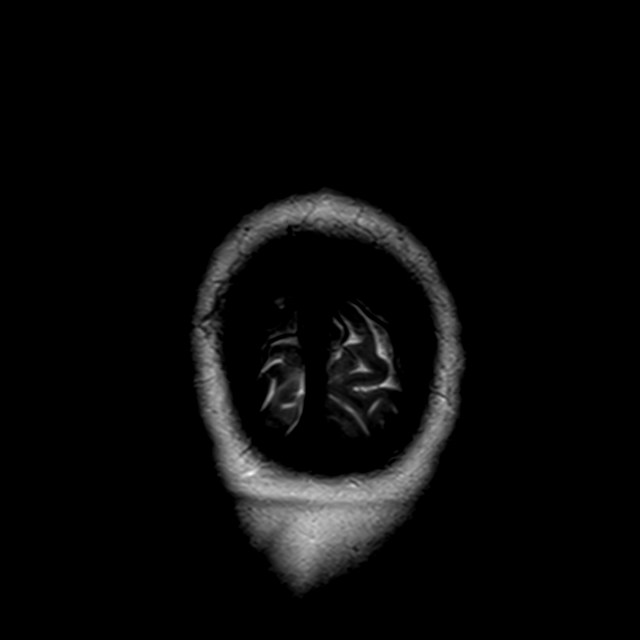

[44 of 48 positions shown; findings below may reference images not displayed]

FINDINGS: MRI HEAD FINDINGS

Brain: Restricted diffusion with ADC correlate in the mesial left
temporal lobe, measuring up to 4.3 x 2.2 x 2.2 cm (series 5, image
73 and series 7, image 48), which correlates with the hypodensity
seen on the same-day CT. Additional scattered foci of restricted
diffusion with ADC correlates in the left occipital lobe (series 5,
image 75 and 76) as well as in the posterior left thalamus (series
5, image 77).

No acute hemorrhage, mass, mass effect, or midline shift. Mildly
increased T2 signal is associated with the previously mentioned
areas of restricted diffusion, likely mild edema. No hydrocephalus
or extra-axial collection.

Vascular: Please see MRA findings below

Skull and upper cervical spine: Normal marrow signal. Possible mild
spinal canal stenosis C2-C3.

Sinuses/Orbits: No acute or significant finding.

Other: The mastoids are well aerated.

MRA HEAD FINDINGS

Anterior circulation: Both internal carotid arteries are patent to
the termini, without stenosis or other abnormality.

A1 segments patent, with mild focal narrowing in the mid right A1
(series 1, image 101). Normal anterior communicating artery.
Anterior cerebral arteries are patent to their distal aspects.

No M1 stenosis or occlusion. Normal MCA bifurcations. Distal MCA
branches perfused and symmetric.

Posterior circulation: Vertebral arteries patent to the
vertebrobasilar junction without stenosis. Right dominant.

Basilar patent to its distal aspect. Superior cerebellar arteries
patent bilaterally.

Patent short right P1, with prominent right posterior communicating
artery. The right PCA is well perfused.

Poor visualization of the left P1, which likely shares a common
origin with the left SCA (series 1, image 71) poor opacification of
the P1 proximally (series 1, image 75-80), with patent left
posterior communicating artery. Focal non opacification of the left
P2 (series 1 images 85-89). Poor perfusion of the remainder of the
left P2 (series 1, image 87), with nonvisualization of the left P3
segments.

Anatomic variants: None significant

MRA NECK FINDINGS

Aortic arch: Three-vessel arch. No evidence of aneurysm, dissection,
or significant proximal stenosis.

Right carotid system: Patent, without significant stenosis,
dissection, or occlusion.

Left carotid system: Patent, without significant stenosis,
dissection, or occlusion.

Vertebral arteries: The right vertebral artery is visualized from
its origin to the skull base. There is significant tortuosity in the
midcervical spine (series 11, image 49). The left vertebral artery
is diminutive and minimally visualized from just distal to its
origin to the skull base, although it does appear patent. The origin
of the left vertebral artery is not well evaluated, likely secondary
to artifact.

Other: None
IMPRESSION: 1. Acute infarct in the posterior mesial temporal lobe, with
additional scattered infarcts in the posterior left thalamus and
left occipital lobe, consistent with left PCA territory infarct.
2. Poor visualization of the left PCA, with non opacification of the
proximal left P2, as well as P3 segments. Poor opacification of the
left P1 and more distal P2 segment. No other significant
intracranial stenosis.
3. Diminutive left vertebral artery, which is not dominant. This may
be congenital and or may represent atherosclerotic stenosis. No
other significant stenosis in the neck.

Code stroke imaging results were communicated on 02/19/2021 at [DATE] to provider Dr. Jumper via secure text paging.

## 2023-02-20 ENCOUNTER — Other Ambulatory Visit: Payer: Self-pay | Admitting: Family Medicine

## 2023-02-20 DIAGNOSIS — I10 Essential (primary) hypertension: Secondary | ICD-10-CM

## 2023-02-20 DIAGNOSIS — E119 Type 2 diabetes mellitus without complications: Secondary | ICD-10-CM

## 2023-02-22 ENCOUNTER — Ambulatory Visit: Payer: Medicare Other

## 2023-03-01 ENCOUNTER — Ambulatory Visit: Payer: Medicare Other

## 2023-03-01 DIAGNOSIS — I639 Cerebral infarction, unspecified: Secondary | ICD-10-CM

## 2023-03-01 LAB — CUP PACEART REMOTE DEVICE CHECK
Date Time Interrogation Session: 20250126231705
Implantable Pulse Generator Implant Date: 20230407

## 2023-03-05 NOTE — Progress Notes (Signed)
 [facility] Summary Report / Loop Recorder

## 2023-03-26 ENCOUNTER — Other Ambulatory Visit: Payer: Self-pay | Admitting: Family Medicine

## 2023-03-26 DIAGNOSIS — E1159 Type 2 diabetes mellitus with other circulatory complications: Secondary | ICD-10-CM

## 2023-03-26 DIAGNOSIS — E1122 Type 2 diabetes mellitus with diabetic chronic kidney disease: Secondary | ICD-10-CM

## 2023-03-29 ENCOUNTER — Ambulatory Visit: Payer: Medicare Other

## 2023-03-31 ENCOUNTER — Other Ambulatory Visit: Payer: Self-pay | Admitting: Family Medicine

## 2023-03-31 DIAGNOSIS — E1122 Type 2 diabetes mellitus with diabetic chronic kidney disease: Secondary | ICD-10-CM

## 2023-04-02 ENCOUNTER — Encounter: Payer: Self-pay | Admitting: Gastroenterology

## 2023-04-02 ENCOUNTER — Ambulatory Visit: Payer: Medicare Other | Admitting: Gastroenterology

## 2023-04-02 ENCOUNTER — Other Ambulatory Visit (INDEPENDENT_AMBULATORY_CARE_PROVIDER_SITE_OTHER): Payer: Medicare Other

## 2023-04-02 VITALS — BP 134/78 | HR 70 | Ht 68.0 in | Wt 265.0 lb

## 2023-04-02 DIAGNOSIS — Z860100 Personal history of colon polyps, unspecified: Secondary | ICD-10-CM

## 2023-04-02 DIAGNOSIS — R7989 Other specified abnormal findings of blood chemistry: Secondary | ICD-10-CM

## 2023-04-02 DIAGNOSIS — Z860101 Personal history of adenomatous and serrated colon polyps: Secondary | ICD-10-CM | POA: Diagnosis not present

## 2023-04-02 DIAGNOSIS — Z7901 Long term (current) use of anticoagulants: Secondary | ICD-10-CM

## 2023-04-02 DIAGNOSIS — R945 Abnormal results of liver function studies: Secondary | ICD-10-CM

## 2023-04-02 LAB — COMPREHENSIVE METABOLIC PANEL
ALT: 42 U/L (ref 0–53)
AST: 40 U/L — ABNORMAL HIGH (ref 0–37)
Albumin: 4.4 g/dL (ref 3.5–5.2)
Alkaline Phosphatase: 91 U/L (ref 39–117)
BUN: 23 mg/dL (ref 6–23)
CO2: 26 meq/L (ref 19–32)
Calcium: 9.4 mg/dL (ref 8.4–10.5)
Chloride: 103 meq/L (ref 96–112)
Creatinine, Ser: 1.11 mg/dL (ref 0.40–1.50)
GFR: 67.73 mL/min (ref >60.00)
Glucose, Bld: 146 mg/dL — ABNORMAL HIGH (ref 70–99)
Potassium: 4.5 meq/L (ref 3.5–5.1)
Sodium: 139 meq/L (ref 135–145)
Total Bilirubin: 0.7 mg/dL (ref 0.2–1.2)
Total Protein: 8.7 g/dL — ABNORMAL HIGH (ref 6.0–8.3)

## 2023-04-02 LAB — CBC
HCT: 42.3 % (ref 39.0–52.0)
Hemoglobin: 14 g/dL (ref 13.0–17.0)
MCHC: 33.2 g/dL (ref 30.0–36.0)
MCV: 95.1 fL (ref 78.0–100.0)
Platelets: 219 10*3/uL (ref 150.0–400.0)
RBC: 4.45 Mil/uL (ref 4.22–5.81)
RDW: 14.6 % (ref 11.5–15.5)
WBC: 8.9 10*3/uL (ref 4.0–10.5)

## 2023-04-02 LAB — TSH: TSH: 3.48 u[IU]/mL (ref 0.35–5.50)

## 2023-04-02 LAB — GAMMA GT: GGT: 103 U/L — ABNORMAL HIGH (ref 7–51)

## 2023-04-02 LAB — PROTIME-INR
INR: 1.8 {ratio} — ABNORMAL HIGH (ref 0.8–1.0)
Prothrombin Time: 18.8 s — ABNORMAL HIGH (ref 9.6–13.1)

## 2023-04-02 LAB — CK: Total CK: 155 U/L (ref 7–232)

## 2023-04-02 MED ORDER — NA SULFATE-K SULFATE-MG SULF 17.5-3.13-1.6 GM/177ML PO SOLN: 1.0000 | ORAL | 0 refills | Status: DC

## 2023-04-02 NOTE — Progress Notes (Signed)
 GASTROENTEROLOGY OUTPATIENT CLINIC VISIT   Primary Care Provider Garnette Gunner, MD [address] [place] [address] [phone]  Patient Profile: Joe Stewart is a 68 y.o. male with a pmh significant for prior CVA, Afib (on Eliquis), diabetes, hypertension, hyperlipidemia, obesity, arthritis, colon polyps (TAs and large TA Cecum s/p EMR in 2021).  The patient presents to the [facility] for an evaluation and management of problem(s) noted below:  Problem List 1. Hx of adenomatous colonic polyps   2. Abnormal LFTs   3. Chronic anticoagulation    Discussed the use of AI scribe software for clinical note transcription with the patient, who gave verbal consent to proceed.  History of Present Illness Please see prior notes for full details of HPI.  Interval History  The patient presents for follow-up.  I last saw him nearly 4 years ago for large tubular adenoma resection in the cecum, but surveillance was not performed.  He has had recent abnormal LFTs followed by PCP and is referred for further evaluation of these (last labs from October).  He does recall some medication adjustments in the fall but cannot recall which medication.  He denies jaundice, scleral icterus, or clay-colored stools.  He does have some slight edema, but this is unchanged for him.  His weight remains stable in the overweight level.  He reports a history of hemorrhoids that used to bleed but notes no issues since the colonoscopy years ago and no other current gastrointestinal symptoms such as difficulty swallowing, heartburn, or abdominal pain.  He is currently on Eliquis, managed by his cardiologist. He does not consume any alcohol.  GI Review of Systems Positive as above Negative for dysphagia, odynophagia, nausea, vomiting, change in bowel habits, melena, hematochezia  Review of Systems General: Denies fevers/chills/unintentional weight loss Cardiovascular: Denies chest  pain Pulmonary: Denies shortness of breath Gastroenterological: See HPI Genitourinary: Denies darkened urine Hematological: Denies easy bruising/bleeding Dermatological: Denies jaundice Psychological: Mood is stable   Medications Current Outpatient Medications  Medication Sig Dispense Refill   amLODipine (NORVASC) 5 MG tablet Take 1 tablet (5 mg total) by mouth daily. 90 tablet 3   apixaban (ELIQUIS) 5 MG TABS tablet TAKE 1 TABLET BY MOUTH TWICE A DAY 60 tablet 6   atorvastatin (LIPITOR) 80 MG tablet TAKE 1 TABLET BY MOUTH EVERY DAY 90 tablet 1   blood glucose meter kit and supplies KIT Dispense based on patient and insurance preference. Use up to four times daily as directed. 1 each 0   glipiZIDE (GLUCOTROL) 10 MG tablet Take 1 tablet (10 mg total) by mouth 2 (two) times daily before a meal. 180 tablet 3   hydrochlorothiazide (HYDRODIURIL) 25 MG tablet TAKE 1 TABLET (25 MG TOTAL) BY MOUTH DAILY. 90 tablet 3   JARDIANCE 25 MG TABS tablet TAKE 1 TABLET (25 MG TOTAL) BY MOUTH DAILY. 30 tablet 2   losartan (COZAAR) 100 MG tablet TAKE 1 TABLET BY MOUTH EVERY DAY 90 tablet 0   metFORMIN (GLUCOPHAGE) 500 MG tablet TAKE 1 TABLET BY MOUTH 2 TIMES DAILY WITH A MEAL. 180 tablet 3   metoprolol succinate (TOPROL-XL) 50 MG 24 hr tablet Take 1 tablet (50 mg total) by mouth daily. Take with or immediately following a meal. 90 tablet 3   Na Sulfate-K Sulfate-Mg Sulfate concentrate (SUPREP BOWEL PREP KIT) 17.5-3.13-1.6 GM/177ML SOLN Take 1 kit (354 mLs total) by mouth as directed. For colonoscopy prep 354 mL 0   No current facility-administered medications for this visit.  Allergies No Known Allergies  Histories Past Medical History:  Diagnosis Date   Arthritis    Cataract    Mixed OU   Diabetes mellitus without complication (HCC)    Diabetic retinopathy (HCC)    NPDR OU   Hyperlipidemia    Hypertension    Hypertensive retinopathy    OU   Obesity    Stroke [facility])    Past Surgical  History:  Procedure Laterality Date   BIOPSY  11/29/2019   Procedure: BIOPSY;  Surgeon: Lemar Lofty., MD;  Location: [facility];  Service: Gastroenterology;;   COLONOSCOPY WITH PROPOFOL N/A 11/29/2019   Procedure: COLONOSCOPY WITH PROPOFOL;  Surgeon: Lemar Lofty., MD;  Location: [facility];  Service: Gastroenterology;  Laterality: N/A;   ENDOSCOPIC MUCOSAL RESECTION N/A 11/29/2019   Procedure: ENDOSCOPIC MUCOSAL RESECTION;  Surgeon: Meridee Score Netty Starring., MD;  Location: [facility];  Service: Gastroenterology;  Laterality: N/A;   ESOPHAGOGASTRODUODENOSCOPY (EGD) WITH PROPOFOL N/A 11/29/2019   Procedure: ESOPHAGOGASTRODUODENOSCOPY (EGD) WITH PROPOFOL;  Surgeon: Meridee Score Netty Starring., MD;  Location: [facility];  Service: Gastroenterology;  Laterality: N/A;   EYE SURGERY Left    had to have eye flushed after getting costic sodium in L eye   HEMOSTASIS CLIP PLACEMENT  11/29/2019   Procedure: HEMOSTASIS CLIP PLACEMENT;  Surgeon: Lemar Lofty., MD;  Location: [facility];  Service: Gastroenterology;;   HERNIA REPAIR     HERNIA REPAIR     35 years ago   HOT HEMOSTASIS N/A 11/29/2019   Procedure: HOT HEMOSTASIS (ARGON PLASMA COAGULATION/BICAP);  Surgeon: Lemar Lofty., MD;  Location: [facility];  Service: Gastroenterology;  Laterality: N/A;   JOINT REPLACEMENT  2021   Left knee replacement   LEFT HEART CATH AND CORONARY ANGIOGRAPHY N/A 04/11/2019   Procedure: LEFT HEART CATH AND CORONARY ANGIOGRAPHY;  Surgeon: Yates Decamp, MD;  Location: [facility] CV [facility];  Service: Cardiovascular;  Laterality: N/A;   POLYPECTOMY  11/29/2019   Procedure: POLYPECTOMY;  Surgeon: Meridee Score Netty Starring., MD;  Location: [facility];  Service: Gastroenterology;;   RENAL ANGIOGRAPHY Bilateral 04/11/2019   Procedure: RENAL ANGIOGRAPHY;  Surgeon: Yates Decamp, MD;  Location: [facility] INVASIVE CV LAB;  Service: Cardiovascular;  Laterality: Bilateral;   SUBMUCOSAL LIFTING  INJECTION  11/29/2019   Procedure: SUBMUCOSAL LIFTING INJECTION;  Surgeon: Lemar Lofty., MD;  Location: [facility];  Service: Gastroenterology;;   TOTAL KNEE ARTHROPLASTY Left 08/01/2019   Procedure: LEFT TOTAL KNEE ARTHROPLASTY;  Surgeon: Cammy Copa, MD;  Location: [facility] OR;  Service: Orthopedics;  Laterality: Left;   Social History   Socioeconomic History   Marital status: Single    Spouse name: Not on file   Number of children: 1   Years of education: Not on file   Highest education level: Not on file  Occupational History   Not on file  Tobacco Use   Smoking status: Never   Smokeless tobacco: Never  Vaping Use   Vaping status: Never Used  Substance and Sexual Activity   Alcohol use: Not Currently   Drug use: Never   Sexual activity: Not Currently  Other Topics Concern   Not on file  Social History Narrative   Not on file   Social Drivers of Health   Financial Resource Strain: Low Risk  (06/23/2022)   Overall Financial Resource Strain (CARDIA)    Difficulty of Paying Living Expenses: Not hard at all  Food Insecurity: No Food Insecurity (06/23/2022)   Hunger Vital Sign    Worried About Running  Out of Food in the Last Year: Never true    Ran Out of Food in the Last Year: Never true  Transportation Needs: No Transportation Needs (06/23/2022)   PRAPARE - Administrator, Civil Service (Medical): No    Lack of Transportation (Non-Medical): No  Physical Activity: Insufficiently Active (06/23/2022)   Exercise Vital Sign    Days of Exercise per Week: 2 days    Minutes of Exercise per Session: 30 min  Stress: No Stress Concern Present (05/28/2021)   [employer] of Occupational Health - Occupational Stress Questionnaire    Feeling of Stress : Only a little  Social Connections: Unknown (06/23/2022)   Social Connection and Isolation Panel [NHANES]    Frequency of Communication with Friends and Family: More than three times a week    Frequency of  Social Gatherings with Friends and Family: Three times a week    Attends Religious Services: Not on file    Active Member of Clubs or Organizations: No    Attends Club or Organization Meetings: Never    Marital Status: Divorced  Intimate Partner Violence: Not At Risk (05/28/2021)   Humiliation, Afraid, Rape, and Kick questionnaire    Fear of Current or Ex-Partner: No    Emotionally Abused: No    Physically Abused: No    Sexually Abused: No   Family History  Problem Relation Age of Onset   Diabetes Mother    COPD Mother    Cancer Mother        unknown type    Heart disease Father    COPD Sister    Healthy Brother    Heart disease Brother    Healthy Son    Colon cancer Neg Hx    Colon polyps Neg Hx    Esophageal cancer Neg Hx    Rectal cancer Neg Hx    Stomach cancer Neg Hx    Inflammatory bowel disease Neg Hx    Liver disease Neg Hx    Pancreatic cancer Neg Hx    I have reviewed his medical, social, and family history in detail and updated the electronic medical record as necessary.    PHYSICAL EXAMINATION  BP 134/78   Pulse 70   Ht 5\' 8"  (1.727 m)   Wt 265 lb (120.2 kg)   BMI 40.29 kg/m  Wt Readings from Last 3 Encounters:  04/02/23 265 lb (120.2 kg)  01/14/23 261 lb (118.4 kg)  12/10/22 258 lb 12.8 oz (117.4 kg)  GEN: NAD, appears stated age, doesn't appear chronically ill PSYCH: Cooperative, without pressured speech EYE: Conjunctivae pink, sclerae anicteric ENT: MMM CV: Nontachycardic RESP: No audible wheezing GI: NABS, soft, NT/ND, without rebound or guarding MSK/EXT: No lower extremity edema SKIN: No jaundice or palmar erythema or evidence of spider angiomata NEURO:  Alert & Oriented x 3, no focal deficits   REVIEW OF DATA  I reviewed the following data at the time of this encounter:  GI Procedures and Studies  2021 Colonoscopy - Hemorrhoids found on digital rectal exam. - Stool in the entire examined colon. - Polypoid lesion in the cecum. Complete  removal was accomplished via piecemeal mucosal resection. STSC to the margin performed. Clips (MR conditional) were placed to decrease risk of post-interventional bleeding. ABx given due to char noted on resection base to decrease post-polypectomy syndrome. - Two 4 to 5 mm polyps in the transverse colon, removed with a cold snare. Resected and retrieved. - Normal mucosa in the entire examined colon  otherwise. - Non-bleeding non-thrombosed internal hemorrhoids.  Laboratory Studies  Reviewed those in epic  Imaging Studies  No relevant studies to review   ASSESSMENT  Mr. Haynie is a 68 y.o. male with a pmh significant for prior CVA, Afib (on Eliquis), diabetes, hypertension, hyperlipidemia, obesity, arthritis, colon polyps (TAs and large TA Cecum s/p EMR in 2021).  The patient is seen today for evaluation and management of:  1. Hx of adenomatous colonic polyps   2. Abnormal LFTs   3. Chronic anticoagulation    The patient is hemodynamically and clinically stable.  He is overdue for colon polyp surveillance and is willing to proceed with that this year.  We will work to schedule accordingly.  In regards to his LFT abnormalities, we need to see if they are still as elevated as prior.  MASLD seems most likely underlying diagnosis.  Will extend his workup a bit more, but I want to see repeat biochemical testing being elevated before the more extensive workup is pursued.  Will consider role of elastography evaluation as well.  No plan for liver biopsy currently.  The risks and benefits of endoscopic evaluation were discussed with the patient; these include but are not limited to the risk of perforation, infection, bleeding, missed lesions, lack of diagnosis, severe illness requiring hospitalization, as well as anesthesia and sedation related illnesses.  The patient and/or family is agreeable to proceed.   All patient questions were answered to the best of my ability, and the patient agrees to the  aforementioned plan of action with follow-up as indicated.   PLAN  Proceed with Colonoscopy scheduling -Obtain approval for Eliquis hold Labs as outlined below Consider Elastography and additional labs based on if LFTs still remain elevated and to what degree   Orders Placed This Encounter  Procedures   Procedural/ Surgical Case Request: COLONOSCOPY WITH PROPOFOL, ENDOSCOPIC MUCOSAL RESECTION   CBC   Comp Met (CMET)   TSH   IgA   Tissue transglutaminase, IgA   INR/PT   Hepatitis A Antibody, Total   Hepatitis B Surface AntiBODY   Hepatitis B Core Antibody, total   CK (Creatine Kinase)   Gamma GT   Ambulatory referral to Gastroenterology    New Prescriptions   NA SULFATE-K SULFATE-MG SULFATE CONCENTRATE (SUPREP BOWEL PREP KIT) 17.5-3.13-1.6 GM/177ML SOLN    Take 1 kit (354 mLs total) by mouth as directed. For colonoscopy prep   Modified Medications   No medications on file    Planned Follow Up No follow-ups on file.   Total Time in Face-to-Face and in Coordination of Care for patient including independent/personal interpretation/review of prior testing, medical history, examination, medication adjustment, communicating results with the patient directly, and documentation with the EHR is 45 minutes.  Corliss Parish, MD  Gastroenterology Advanced Endoscopy Office # [phone]

## 2023-04-02 NOTE — Patient Instructions (Signed)
 Your provider has requested that you go to the basement level for lab work before leaving today. Press "B" on the elevator. The lab is located at the first door on the left as you exit the elevator.   You have been scheduled for a colonoscopy. Please follow written instructions given to you at your visit today.   If you use inhalers (even only as needed), please bring them with you on the day of your procedure.  DO NOT TAKE 7 DAYS PRIOR TO TEST- Trulicity (dulaglutide) Ozempic, Wegovy (semaglutide) Mounjaro (tirzepatide) Bydureon Bcise (exanatide extended release)  DO NOT TAKE 1 DAY PRIOR TO YOUR TEST Rybelsus (semaglutide) Adlyxin (lixisenatide) Victoza (liraglutide) Byetta (exanatide) ___________________________________________________________________________  If your blood pressure at your visit was 140/90 or greater, please contact your primary care physician to follow up on this.  _______________________________________________________  If you are age 8 or older, your body mass index should be between 23-30. Your Body mass index is 40.29 kg/m. If this is out of the aforementioned range listed, please consider follow up with your Primary Care Provider.  If you are age 70 or younger, your body mass index should be between 19-25. Your Body mass index is 40.29 kg/m. If this is out of the aformentioned range listed, please consider follow up with your Primary Care Provider.   ________________________________________________________  The [facility] providers would like to encourage you to use [facility] to communicate with providers for non-urgent requests or questions.  Due to long hold times on the telephone, sending your provider a message by [facility] may be a faster and more efficient way to get a response.  Please allow 48 business hours for a response.  Please remember that this is for non-urgent requests.  _______________________________________________________  Due to recent  changes in healthcare laws, you may see the results of your imaging and laboratory studies on MyChart before your provider has had a chance to review them.  We understand that in some cases there may be results that are confusing or concerning to you. Not all laboratory results come back in the same time frame and the provider may be waiting for multiple results in order to interpret others.  Please give [place] 48 hours in order for your provider to thoroughly review all the results before contacting the office for clarification of your results.   Thank you for choosing me and [facility].  Dr. Meridee Score

## 2023-04-03 ENCOUNTER — Encounter: Payer: Self-pay | Admitting: Gastroenterology

## 2023-04-03 ENCOUNTER — Other Ambulatory Visit (HOSPITAL_COMMUNITY): Payer: Self-pay | Admitting: Internal Medicine

## 2023-04-03 DIAGNOSIS — R7989 Other specified abnormal findings of blood chemistry: Secondary | ICD-10-CM | POA: Insufficient documentation

## 2023-04-03 DIAGNOSIS — Z7901 Long term (current) use of anticoagulants: Secondary | ICD-10-CM | POA: Insufficient documentation

## 2023-04-03 LAB — TISSUE TRANSGLUTAMINASE, IGA: (tTG) Ab, IgA: <1 U/mL

## 2023-04-03 LAB — HEPATITIS A ANTIBODY, TOTAL: Hepatitis A AB,Total: NONREACTIVE

## 2023-04-03 LAB — HEPATITIS B SURFACE ANTIBODY,QUALITATIVE: Hep B S Ab: NONREACTIVE

## 2023-04-03 LAB — HEPATITIS B CORE ANTIBODY, TOTAL: Hep B Core Total Ab: NONREACTIVE

## 2023-04-03 LAB — IGA: Immunoglobulin A: 264 mg/dL (ref 70–320)

## 2023-04-05 ENCOUNTER — Ambulatory Visit: Payer: Medicare Other

## 2023-04-05 DIAGNOSIS — I639 Cerebral infarction, unspecified: Secondary | ICD-10-CM | POA: Diagnosis not present

## 2023-04-05 LAB — CUP PACEART REMOTE DEVICE CHECK
Date Time Interrogation Session: 20250302230918
Implantable Pulse Generator Implant Date: 20230407

## 2023-04-06 ENCOUNTER — Encounter: Payer: Self-pay | Admitting: Gastroenterology

## 2023-04-06 ENCOUNTER — Other Ambulatory Visit: Payer: Self-pay

## 2023-04-06 DIAGNOSIS — R7989 Other specified abnormal findings of blood chemistry: Secondary | ICD-10-CM

## 2023-04-06 DIAGNOSIS — Z860101 Personal history of adenomatous and serrated colon polyps: Secondary | ICD-10-CM

## 2023-04-07 ENCOUNTER — Telehealth: Payer: Self-pay

## 2023-04-07 ENCOUNTER — Encounter: Payer: Self-pay | Admitting: Internal Medicine

## 2023-04-07 NOTE — Telephone Encounter (Signed)
 Pharmacy please advise on holding Eliquis prior to colonoscopy scheduled for 05/13/23. Thank you.

## 2023-04-07 NOTE — Telephone Encounter (Signed)
 Request for surgical clearance:     Endoscopy Procedure  What type of surgery is being performed?     Colonoscopy at [facility]   When is this surgery scheduled?     05/13/23  What type of clearance is required ?   Pharmacy  Are there any medications that need to be held prior to surgery and how long? Eliquis x2 days prior to procedure  Practice name and name of physician performing surgery?      [facility]  What is your office phone and fax number?      Phone- [phone]  Fax- [phone]  Anesthesia type (None, local, MAC, general) ?       MAC  Please route your response to Lucky Rathke, CMA

## 2023-04-09 NOTE — Telephone Encounter (Signed)
   Patient Name: Joe Stewart  DOB: 1955-04-24 MRN: [id number]  Primary Cardiologist: Christell Constant, MD  Clinical pharmacists have reviewed the patient's past medical history, labs, and current medications as part of preoperative protocol coverage. The following recommendations have been made:  Per office protocol, patient can hold Eliquis for 2 days prior to procedure.    I will route this recommendation to the requesting party via Epic fax function and remove from pre-op pool.  Please call with questions.  Napoleon Form, Leodis Rains, NP 04/09/2023, 4:38 PM

## 2023-04-09 NOTE — Telephone Encounter (Signed)
 Patient with diagnosis of afib on Eliquis for anticoagulation.    Procedure: Colonoscopy at [facility]  Date of procedure: 05/13/23   CHA2DS2-VASc Score = 6   This indicates a 9.7% annual risk of stroke. The patient's score is based upon: CHF History: 0 HTN History: 1 Diabetes History: 1 Stroke History: 2 Vascular Disease History: 1 Age Score: 1 Gender Score: 0      CrCl 80 ml/min Platelet count 219  Per office protocol, patient can hold Eliquis for 2 days prior to procedure.    **This guidance is not considered finalized until pre-operative APP has relayed final recommendations.**

## 2023-04-09 NOTE — Progress Notes (Signed)
 [facility] Summary Report / Loop Recorder

## 2023-04-15 NOTE — Telephone Encounter (Signed)
 Left message for patient

## 2023-04-22 ENCOUNTER — Encounter: Payer: Self-pay | Admitting: Gastroenterology

## 2023-04-27 NOTE — Telephone Encounter (Signed)
 Patient has been informed okay to hold Eliquis 2 days prior to procedure per Cardiology. Patient voiced understanding.

## 2023-05-03 ENCOUNTER — Ambulatory Visit: Payer: Medicare Other

## 2023-05-05 ENCOUNTER — Telehealth: Payer: Self-pay | Admitting: Gastroenterology

## 2023-05-05 ENCOUNTER — Encounter (HOSPITAL_COMMUNITY): Payer: Self-pay | Admitting: Gastroenterology

## 2023-05-05 NOTE — Telephone Encounter (Signed)
 Procedure:Colonoscopy Procedure date: 05/13/23 Procedure location: [facility] Arrival Time: 8:00 am Spoke with the patient Y/N: Yes Any prep concerns? No  Has the patient obtained the prep from the pharmacy ? Yes Do you have a care partner and transportation: Yes Any additional concerns?  No

## 2023-05-07 NOTE — Progress Notes (Signed)
 [facility] Summary Report / Loop Recorder

## 2023-05-10 ENCOUNTER — Ambulatory Visit (INDEPENDENT_AMBULATORY_CARE_PROVIDER_SITE_OTHER): Payer: Medicare Other

## 2023-05-10 DIAGNOSIS — I639 Cerebral infarction, unspecified: Secondary | ICD-10-CM

## 2023-05-10 LAB — CUP PACEART REMOTE DEVICE CHECK
Date Time Interrogation Session: 20250406231004
Implantable Pulse Generator Implant Date: 20230407

## 2023-05-11 ENCOUNTER — Encounter: Payer: Self-pay | Admitting: Internal Medicine

## 2023-05-12 NOTE — Anesthesia Preprocedure Evaluation (Signed)
 Anesthesia Evaluation  Patient identified by MRN, date of birth, ID band Patient awake    Reviewed: Allergy & Precautions, H&P , NPO status , Patient's Chart, lab work & pertinent test results  Airway Mallampati: II  TM Distance: >3 FB Neck ROM: Full    Dental no notable dental hx. (+) Missing, Dental Advisory Given   Pulmonary neg pulmonary ROS   Pulmonary exam normal breath sounds clear to auscultation       Cardiovascular Exercise Tolerance: Good hypertension, Pt. on medications and Pt. on home beta blockers + dysrhythmias Atrial Fibrillation  Rhythm:Regular Rate:Normal     Neuro/Psych CVA, Residual Symptoms  negative psych ROS   GI/Hepatic negative GI ROS, Neg liver ROS,,,  Endo/Other  diabetes, Type 2, Oral Hypoglycemic Agents  Class 3 obesity  Renal/GU negative Renal ROS  negative genitourinary   Musculoskeletal  (+) Arthritis , Osteoarthritis,    Abdominal   Peds  Hematology  (+) Blood dyscrasia, anemia   Anesthesia Other Findings   Reproductive/Obstetrics negative OB ROS                             Anesthesia Physical Anesthesia Plan  ASA: 3  Anesthesia Plan: MAC   Post-op Pain Management: Minimal or no pain anticipated   Induction: Intravenous  PONV Risk Score and Plan: 1 and Propofol infusion  Airway Management Planned: Natural Airway and Simple Face Mask  Additional Equipment:   Intra-op Plan:   Post-operative Plan:   Informed Consent: I have reviewed the patients History and Physical, chart, labs and discussed the procedure including the risks, benefits and alternatives for the proposed anesthesia with the patient or authorized representative who has indicated his/her understanding and acceptance.     Dental advisory given  Plan Discussed with: CRNA  Anesthesia Plan Comments:        Anesthesia Quick Evaluation

## 2023-05-13 ENCOUNTER — Encounter (HOSPITAL_COMMUNITY)
Admission: RE | Disposition: A | Discharge status: Home / Self Care | Payer: Self-pay | Source: Home / Self Care | Attending: Gastroenterology

## 2023-05-13 ENCOUNTER — Other Ambulatory Visit: Payer: Self-pay

## 2023-05-13 ENCOUNTER — Ambulatory Visit (HOSPITAL_COMMUNITY)
Admission: RE | Admit: 2023-05-13 | Discharge: 2023-05-13 | Disposition: A | Discharge status: Home / Self Care | Payer: Medicare Other | Attending: Gastroenterology | Admitting: Gastroenterology

## 2023-05-13 ENCOUNTER — Ambulatory Visit (HOSPITAL_COMMUNITY)

## 2023-05-13 ENCOUNTER — Encounter (HOSPITAL_COMMUNITY): Payer: Self-pay | Admitting: Gastroenterology

## 2023-05-13 DIAGNOSIS — Z6841 Body Mass Index (BMI) 40.0 and over, adult: Secondary | ICD-10-CM | POA: Insufficient documentation

## 2023-05-13 DIAGNOSIS — E66813 Obesity, class 3: Secondary | ICD-10-CM | POA: Insufficient documentation

## 2023-05-13 DIAGNOSIS — D123 Benign neoplasm of transverse colon: Secondary | ICD-10-CM | POA: Insufficient documentation

## 2023-05-13 DIAGNOSIS — D122 Benign neoplasm of ascending colon: Secondary | ICD-10-CM | POA: Diagnosis not present

## 2023-05-13 DIAGNOSIS — Z8673 Personal history of transient ischemic attack (TIA), and cerebral infarction without residual deficits: Secondary | ICD-10-CM | POA: Insufficient documentation

## 2023-05-13 DIAGNOSIS — Z9889 Other specified postprocedural states: Secondary | ICD-10-CM | POA: Diagnosis not present

## 2023-05-13 DIAGNOSIS — E119 Type 2 diabetes mellitus without complications: Secondary | ICD-10-CM | POA: Diagnosis not present

## 2023-05-13 DIAGNOSIS — Z1211 Encounter for screening for malignant neoplasm of colon: Secondary | ICD-10-CM | POA: Insufficient documentation

## 2023-05-13 DIAGNOSIS — I4891 Unspecified atrial fibrillation: Secondary | ICD-10-CM

## 2023-05-13 DIAGNOSIS — K641 Second degree hemorrhoids: Secondary | ICD-10-CM

## 2023-05-13 DIAGNOSIS — Z860100 Personal history of colon polyps, unspecified: Secondary | ICD-10-CM | POA: Diagnosis not present

## 2023-05-13 DIAGNOSIS — I2510 Atherosclerotic heart disease of native coronary artery without angina pectoris: Secondary | ICD-10-CM | POA: Diagnosis not present

## 2023-05-13 DIAGNOSIS — R7989 Other specified abnormal findings of blood chemistry: Secondary | ICD-10-CM

## 2023-05-13 DIAGNOSIS — Z860101 Personal history of adenomatous and serrated colon polyps: Secondary | ICD-10-CM

## 2023-05-13 DIAGNOSIS — I10 Essential (primary) hypertension: Secondary | ICD-10-CM | POA: Insufficient documentation

## 2023-05-13 DIAGNOSIS — D126 Benign neoplasm of colon, unspecified: Secondary | ICD-10-CM | POA: Diagnosis not present

## 2023-05-13 HISTORY — DX: Cardiac arrhythmia, unspecified: I49.9

## 2023-05-13 HISTORY — PX: COLONOSCOPY WITH PROPOFOL: SHX5780

## 2023-05-13 HISTORY — PX: POLYPECTOMY: SHX149

## 2023-05-13 LAB — GLUCOSE, CAPILLARY: Glucose-Capillary: 139 mg/dL — ABNORMAL HIGH (ref 70–99)

## 2023-05-13 SURGERY — COLONOSCOPY WITH PROPOFOL
Anesthesia: Monitor Anesthesia Care

## 2023-05-13 MED ORDER — SODIUM CHLORIDE 0.9 % IV SOLN: INTRAVENOUS | Status: DC

## 2023-05-13 MED ORDER — PROPOFOL 10 MG/ML IV BOLUS
INTRAVENOUS | Status: DC | PRN
  Administered 2023-05-13: 20 mg via INTRAVENOUS
  Administered 2023-05-13: 30 mg via INTRAVENOUS

## 2023-05-13 MED ORDER — PROPOFOL 500 MG/50ML IV EMUL
INTRAVENOUS | Status: DC | PRN
  Administered 2023-05-13: 150 ug/kg/min via INTRAVENOUS

## 2023-05-13 MED ORDER — PHENYLEPHRINE HCL (PRESSORS) 10 MG/ML IV SOLN
INTRAVENOUS | Status: DC | PRN
  Administered 2023-05-13: 80 ug via INTRAVENOUS
  Administered 2023-05-13 (×3): 160 ug via INTRAVENOUS

## 2023-05-13 MED ORDER — LACTATED RINGERS IV SOLN: INTRAVENOUS | Status: DC | PRN

## 2023-05-13 MED ORDER — LIDOCAINE 2% (20 MG/ML) 5 ML SYRINGE
INTRAMUSCULAR | Status: DC | PRN
  Administered 2023-05-13: 50 mg via INTRAVENOUS

## 2023-05-13 MED ORDER — SODIUM CHLORIDE 0.9 % IV SOLN
INTRAVENOUS | Status: AC | PRN
  Administered 2023-05-13: 500 mL via INTRAMUSCULAR

## 2023-05-13 MED ORDER — PROPOFOL 1000 MG/100ML IV EMUL
INTRAVENOUS | Status: AC
  Filled 2023-05-13: qty 100

## 2023-05-13 SURGICAL SUPPLY — 20 items
ELECT REM PT RETURN 9FT ADLT (ELECTROSURGICAL) IMPLANT
ELECTRODE REM PT RTRN 9FT ADLT (ELECTROSURGICAL) IMPLANT
FLOOR PAD 36X40 (MISCELLANEOUS) ×2 IMPLANT
FORCEPS BIOP RAD 4 LRG CAP 4 (CUTTING FORCEPS) IMPLANT
FORCEPS BIOP RJ4 240 W/NDL (CUTTING FORCEPS) IMPLANT
FORCEPS BXJMBJMB 240X2.8X (CUTTING FORCEPS) IMPLANT
INJECTOR/SNARE I SNARE (MISCELLANEOUS) IMPLANT
LUBRICANT JELLY 4.5OZ STERILE (MISCELLANEOUS) IMPLANT
MANIFOLD NEPTUNE II (INSTRUMENTS) IMPLANT
NDL SCLEROTHERAPY 25GX240 (NEEDLE) IMPLANT
NEEDLE SCLEROTHERAPY 25GX240 (NEEDLE) IMPLANT
PAD FLOOR 36X40 (MISCELLANEOUS) ×3 IMPLANT
PROBE APC STR FIRE (PROBE) IMPLANT
PROBE INJECTION GOLD 7FR (MISCELLANEOUS) IMPLANT
SNARE ROTATE MED OVAL 20MM (MISCELLANEOUS) IMPLANT
SYR 50ML LL SCALE MARK (SYRINGE) IMPLANT
TRAP SPECIMEN MUCOUS 40CC (MISCELLANEOUS) IMPLANT
TUBING ENDO SMARTCAP PENTAX (MISCELLANEOUS) IMPLANT
TUBING IRRIGATION ENDOGATOR (MISCELLANEOUS) ×3 IMPLANT
WATER STERILE IRR 1000ML POUR (IV SOLUTION) IMPLANT

## 2023-05-13 NOTE — Transfer of Care (Signed)
 Immediate Anesthesia Transfer of Care Note  Patient: Joe Stewart  Procedure(s) Performed: COLONOSCOPY WITH PROPOFOL POLYPECTOMY, INTESTINE  Patient Location: PACU and Endoscopy Unit  Anesthesia Type:MAC  Level of Consciousness: awake, alert , oriented, and patient cooperative  Airway & Oxygen Therapy: Patient Spontanous Breathing and Patient connected to nasal cannula oxygen  Post-op Assessment: Report given to RN and Post -op Vital signs reviewed and stable  Post vital signs: Reviewed and stable  Last Vitals:  Vitals Value Taken Time  BP 114/42 05/13/23 1003  Temp 36.7 C 05/13/23 1000  Pulse 79 05/13/23 1004  Resp 14 05/13/23 1004  SpO2 97 % 05/13/23 1004  Vitals shown include unfiled device data.  Last Pain:  Vitals:   05/13/23 1000  TempSrc: Temporal  PainSc:          Complications: No notable events documented.

## 2023-05-13 NOTE — H&P (Signed)
 GASTROENTEROLOGY PROCEDURE H&P NOTE   Primary Care Physician: Garnette Gunner, MD  HPI: Joe Stewart is a 68 y.o. 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  Past Medical History:  Diagnosis Date   Arthritis    Cataract    Mixed OU   Diabetes mellitus without complication (HCC)    Diabetic retinopathy (HCC)    NPDR OU   Dysrhythmia    afib   Hyperlipidemia    Hypertension    Hypertensive retinopathy    OU   Obesity    Stroke [facility])    Past Surgical History:  Procedure Laterality Date   BIOPSY  11/29/2019   Procedure: BIOPSY;  Surgeon: Lemar Lofty., MD;  Location: [facility];  Service: Gastroenterology;;   COLONOSCOPY WITH PROPOFOL N/A 11/29/2019   Procedure: COLONOSCOPY WITH PROPOFOL;  Surgeon: Lemar Lofty., MD;  Location: [facility];  Service: Gastroenterology;  Laterality: N/A;   ENDOSCOPIC MUCOSAL RESECTION N/A 11/29/2019   Procedure: ENDOSCOPIC MUCOSAL RESECTION;  Surgeon: Meridee Score Netty Starring., MD;  Location: [facility] ENDOSCOPY;  Service: Gastroenterology;  Laterality: N/A;   ESOPHAGOGASTRODUODENOSCOPY (EGD) WITH PROPOFOL N/A 11/29/2019   Procedure: ESOPHAGOGASTRODUODENOSCOPY (EGD) WITH PROPOFOL;  Surgeon: Meridee Score Netty Starring., MD;  Location: [facility] ENDOSCOPY;  Service: Gastroenterology;  Laterality: N/A;   EYE SURGERY Left    had to have eye flushed after getting costic sodium in L eye   HEMOSTASIS CLIP PLACEMENT  11/29/2019   Procedure: HEMOSTASIS CLIP PLACEMENT;  Surgeon: Lemar Lofty., MD;  Location: [facility];  Service: Gastroenterology;;   HERNIA REPAIR     HERNIA REPAIR     35 years ago   HOT HEMOSTASIS N/A 11/29/2019   Procedure: HOT HEMOSTASIS (ARGON PLASMA COAGULATION/BICAP);  Surgeon: Lemar Lofty., MD;  Location: [facility];  Service: Gastroenterology;  Laterality: N/A;   JOINT REPLACEMENT  2021   Left knee replacement   LEFT  HEART CATH AND CORONARY ANGIOGRAPHY N/A 04/11/2019   Procedure: LEFT HEART CATH AND CORONARY ANGIOGRAPHY;  Surgeon: Yates Decamp, MD;  Location: [facility] INVASIVE CV LAB;  Service: Cardiovascular;  Laterality: N/A;   POLYPECTOMY  11/29/2019   Procedure: POLYPECTOMY;  Surgeon: Meridee Score Netty Starring., MD;  Location: [facility];  Service: Gastroenterology;;   RENAL ANGIOGRAPHY Bilateral 04/11/2019   Procedure: RENAL ANGIOGRAPHY;  Surgeon: Yates Decamp, MD;  Location: [facility] INVASIVE CV LAB;  Service: Cardiovascular;  Laterality: Bilateral;   SUBMUCOSAL LIFTING INJECTION  11/29/2019   Procedure: SUBMUCOSAL LIFTING INJECTION;  Surgeon: Lemar Lofty., MD;  Location: [facility];  Service: Gastroenterology;;   TOTAL KNEE ARTHROPLASTY Left 08/01/2019   Procedure: LEFT TOTAL KNEE ARTHROPLASTY;  Surgeon: Cammy Copa, MD;  Location: [facility] OR;  Service: Orthopedics;  Laterality: Left;   Current Facility-Administered Medications  Medication Dose Route Frequency Provider Last Rate Last Admin   0.9 %  sodium chloride infusion   Intravenous Continuous Mansouraty, Netty Starring., MD       0.9 %  sodium chloride infusion    Continuous PRN Mansouraty, Netty Starring., MD 10 mL/hr at 05/13/23 0837 500 mL at 05/13/23 9562    Current Facility-Administered Medications:    0.9 %  sodium chloride infusion, , Intravenous, Continuous, Mansouraty, Netty Starring., MD   0.9 %  sodium chloride infusion, , , Continuous PRN, Mansouraty, Netty Starring., MD, Last Rate: 10 mL/hr at 05/13/23 0837, 500 mL at 05/13/23 0837 No Known Allergies Family History  Problem Relation Age of  Onset   Diabetes Mother    COPD Mother    Cancer Mother        unknown type    Heart disease Father    COPD Sister    Healthy Brother    Heart disease Brother    Healthy Son    Colon cancer Neg Hx    Colon polyps Neg Hx    Esophageal cancer Neg Hx    Rectal cancer Neg Hx    Stomach cancer Neg Hx    Inflammatory bowel disease Neg Hx    Liver disease  Neg Hx    Pancreatic cancer Neg Hx    Social History   Socioeconomic History   Marital status: Single    Spouse name: Not on file   Number of children: 1   Years of education: Not on file   Highest education level: Not on file  Occupational History   Not on file  Tobacco Use   Smoking status: Never   Smokeless tobacco: Never  Vaping Use   Vaping status: Never Used  Substance and Sexual Activity   Alcohol use: Not Currently   Drug use: Never   Sexual activity: Not Currently  Other Topics Concern   Not on file  Social History Narrative   Not on file   Social Drivers of Health   Financial Resource Strain: Low Risk  (06/23/2022)   Overall Financial Resource Strain (CARDIA)    Difficulty of Paying Living Expenses: Not hard at all  Food Insecurity: No Food Insecurity (06/23/2022)   Hunger Vital Sign    Worried About Running Out of Food in the Last Year: Never true    Ran Out of Food in the Last Year: Never true  Transportation Needs: No Transportation Needs (06/23/2022)   PRAPARE - Administrator, Civil Service (Medical): No    Lack of Transportation (Non-Medical): No  Physical Activity: Insufficiently Active (06/23/2022)   Exercise Vital Sign    Days of Exercise per Week: 2 days    Minutes of Exercise per Session: 30 min  Stress: No Stress Concern Present (05/28/2021)   [employer] of Occupational Health - Occupational Stress Questionnaire    Feeling of Stress : Only a little  Social Connections: Unknown (06/23/2022)   Social Connection and Isolation Panel [NHANES]    Frequency of Communication with Friends and Family: More than three times a week    Frequency of Social Gatherings with Friends and Family: Three times a week    Attends Religious Services: Not on file    Active Member of Clubs or Organizations: No    Attends Club or Organization Meetings: Never    Marital Status: Divorced  Intimate Partner Violence: Not At Risk (05/28/2021)   Humiliation,  Afraid, Rape, and Kick questionnaire    Fear of Current or Ex-Partner: No    Emotionally Abused: No    Physically Abused: No    Sexually Abused: No    Physical Exam: Today's Vitals   05/13/23 0820 05/13/23 0831  BP: (!) 226/90 (!) 164/64  Pulse: 95   Resp: 20   Temp: 97.6 F (36.4 C)   TempSrc: Tympanic   SpO2: 100%   Weight: 120.2 kg   Height: 5\' 8"  (1.727 m)   PainSc: 0-No pain    Body mass index is 40.29 kg/m. GEN: NAD EYE: Sclerae anicteric ENT: MMM CV: Non-tachycardic GI: Soft, NT/ND NEURO:  Alert & Oriented x 3  Lab Results: No results for input(s): "WBC", "  HGB", "HCT", "PLT" in the last 72 hours. BMET No results for input(s): "NA", "K", "CL", "CO2", "GLUCOSE", "BUN", "CREATININE", "CALCIUM" in the last 72 hours. LFT No results for input(s): "PROT", "ALBUMIN", "AST", "ALT", "ALKPHOS", "BILITOT", "BILIDIR", "IBILI" in the last 72 hours. PT/INR No results for input(s): "LABPROT", "INR" in the last 72 hours.   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  The risks and benefits of endoscopic evaluation/treatment were discussed with the patient and/or family; these include but are not limited to the risk of perforation, infection, bleeding, missed lesions, lack of diagnosis, severe illness requiring hospitalization, as well as anesthesia and sedation related illnesses.  The patient's history has been reviewed, patient examined, no change in status, and deemed stable for procedure.  The patient and/or family is agreeable to proceed.    Corliss Parish, MD [facility]oscopy Office # [phone]

## 2023-05-13 NOTE — Anesthesia Postprocedure Evaluation (Signed)
 Anesthesia Post Note  Patient: Joe Stewart  Procedure(s) Performed: COLONOSCOPY WITH PROPOFOL POLYPECTOMY, INTESTINE     Patient location during evaluation: Endoscopy Anesthesia Type: MAC Level of consciousness: awake and alert Pain management: pain level controlled Vital Signs Assessment: post-procedure vital signs reviewed and stable Respiratory status: spontaneous breathing, nonlabored ventilation and respiratory function stable Cardiovascular status: stable and blood pressure returned to baseline Postop Assessment: no apparent nausea or vomiting Anesthetic complications: no  No notable events documented.  Last Vitals:  Vitals:   05/13/23 1010 05/13/23 1015  BP: 124/81 124/81  Pulse: 80 79  Resp: 16 14  Temp:    SpO2: 100% 100%    Last Pain:  Vitals:   05/13/23 1010  TempSrc:   PainSc: 0-No pain                 Claramae Rigdon,W. EDMOND

## 2023-05-13 NOTE — Discharge Instructions (Addendum)
 Restart Eliquis Saturday morning  YOU HAD AN ENDOSCOPIC PROCEDURE TODAY: Refer to the procedure report and other information in the discharge instructions given to you for any specific questions about what was found during the examination. If this information does not answer your questions, please call [place] office at [phone] to clarify.   YOU SHOULD EXPECT: Some feelings of bloating in the abdomen. Passage of more gas than usual. Walking can help get rid of the air that was put into your GI tract during the procedure and reduce the bloating. If you had a lower endoscopy (such as a colonoscopy or flexible sigmoidoscopy) you may notice spotting of blood in your stool or on the toilet paper. Some abdominal soreness may be present for a day or two, also.  DIET: Your first meal following the procedure should be a light meal and then it is ok to progress to your normal diet. A half-sandwich or bowl of soup is an example of a good first meal. Heavy or fried foods are harder to digest and may make you feel nauseous or bloated. Drink plenty of fluids but you should avoid alcoholic beverages for 24 hours. If you had a esophageal dilation, please see attached instructions for diet.    ACTIVITY: Your care partner should take you home directly after the procedure. You should plan to take it easy, moving slowly for the rest of the day. You can resume normal activity the day after the procedure however YOU SHOULD NOT DRIVE, use power tools, machinery or perform tasks that involve climbing or major physical exertion for 24 hours (because of the sedation medicines used during the test).   SYMPTOMS TO REPORT IMMEDIATELY: A gastroenterologist can be reached at any hour. Please call [phone]  for any of the following symptoms:  Following lower endoscopy (colonoscopy, flexible sigmoidoscopy) Excessive amounts of blood in the stool  Significant tenderness, worsening of abdominal pains  Swelling of the abdomen  that is new, acute  Fever of 100 or higher   FOLLOW UP:  If any biopsies were taken you will be contacted by phone or by letter within the next 1-3 weeks. Call [phone]  if you have not heard about the biopsies in 3 weeks.  Please also call with any specific questions about appointments or follow up tests.

## 2023-05-13 NOTE — Op Note (Addendum)
 [facility] Patient Name: Joe Stewart Procedure Date: 05/13/2023 MRN: [id number] Attending MD: Corliss Parish , MD, [id number] Date of Birth: March 05, 1955 CSN: [id number] Age: 68 Admit Type: Outpatient Procedure:                Colonoscopy Indications:              Surveillance: Personal history of adenomatous                            polyps on last colonoscopy > 3 years ago, High risk                            colon cancer surveillance: Personal history of                            adenoma (10 mm or greater in size), High risk colon                            cancer surveillance: Personal history of adenoma                            with high grade dysplasia Providers:                Corliss Parish, MD, Margaree Mackintosh, RN,                            Kandice Robinsons, Technician Referring MD:              Medicines:                Monitored Anesthesia Care Complications:            No immediate complications. Estimated Blood Loss:     Estimated blood loss was minimal. Procedure:                Pre-Anesthesia Assessment:                           - Prior to the procedure, a History and Physical                            was performed, and patient medications and                            allergies were reviewed. The patient's tolerance of                            previous anesthesia was also reviewed. The risks                            and benefits of the procedure and the sedation                            options and risks were discussed with the patient.  All questions were answered, and informed consent                            was obtained. Prior Anticoagulants: The patient has                            taken no anticoagulant or antiplatelet agents. ASA                            Grade Assessment: III - A patient with severe                            systemic disease. After reviewing the risks and                             benefits, the patient was deemed in satisfactory                            condition to undergo the procedure.                           After obtaining informed consent, the colonoscope                            was passed under direct vision. Throughout the                            procedure, the patient's blood pressure, pulse, and                            oxygen saturations were monitored continuously. The                            CF-HQ190L ([id number]) Olympus colonoscope was                            introduced through the anus and advanced to the the                            cecum, identified by appendiceal orifice and                            ileocecal valve. The colonoscopy was performed                            without difficulty. The patient tolerated the                            procedure. The quality of the bowel preparation was                            adequate. The ileocecal valve, appendiceal orifice,  and rectum were photographed. Scope In: 9:30:36 AM Scope Out: 9:52:36 AM Scope Withdrawal Time: 0 hours 17 minutes 39 seconds  Total Procedure Duration: 0 hours 22 minutes 0 seconds  Findings:      The digital rectal exam findings include hemorrhoids. Pertinent       negatives include no palpable rectal lesions.      A large amount of semi-liquid stool was found in the entire colon,       interfering with visualization. Lavage of the area was performed using       copious amounts, resulting in clearance with adequate visualization.      A medium post mucosectomy scar was found in the cecum. There was no       evidence of the previous polyp.      Three sessile polyps were found in the transverse colon (2) and       ascending colon (1). The polyps were 2 to 5 mm in size. These polyps       were removed with a cold snare. Resection and retrieval were complete.      Normal mucosa was found in the entire colon otherwise.       Non-bleeding non-thrombosed external and internal hemorrhoids were found       during retroflexion, during perianal exam and during digital exam. The       hemorrhoids were Grade II (internal hemorrhoids that prolapse but reduce       spontaneously). Impression:               - Hemorrhoids found on digital rectal exam.                           - Stool in the entire examined colon.                           - Post mucosectomy scar in the cecum.                           - Three 2 to 5 mm polyps in the transverse colon                            and in the ascending colon, removed with a cold                            snare. Resected and retrieved.                           - Normal mucosa in the entire examined colon                            otherwise.                           - Non-bleeding non-thrombosed external and internal                            hemorrhoids. Moderate Sedation:      Not Applicable - Patient had care per Anesthesia. Recommendation:           - The patient will be observed  post-procedure,                            until all discharge criteria are met.                           - Discharge patient to home.                           - Patient has a contact number available for                            emergencies. The signs and symptoms of potential                            delayed complications were discussed with the                            patient. Return to normal activities tomorrow.                            Written discharge instructions were provided to the                            patient.                           - High fiber diet.                           - Use FiberCon 1-2 tablets PO daily.                           - May restart Eliquis on 4/12 AM to decrease risk                            of post interventional bleeding.                           - Continue present medications otherwise.                           - Await pathology  results.                           - Repeat colonoscopy in 3 years for surveillance                            due to the history of previous advanced adenoma.                            Recommend a double preparation.                           - The findings and recommendations were discussed  with the patient.                           - The findings and recommendations were discussed                            with the designated responsible adult. Procedure Code(s):        --- Professional ---                           [phone], Colonoscopy, flexible; with removal of                            tumor(s), polyp(s), or other lesion(s) by snare                            technique Diagnosis Code(s):        --- Professional ---                           K64.1, Second degree hemorrhoids                           Z98.890, Other specified postprocedural states                           D12.3, Benign neoplasm of transverse colon (hepatic                            flexure or splenic flexure)                           D12.2, Benign neoplasm of ascending colon                           Z86.010, Personal history of colonic polyps CPT copyright 2022 American Medical Association. All rights reserved. The codes documented in this report are preliminary and upon coder review may  be revised to meet current compliance requirements. Corliss Parish, MD 05/13/2023 10:06:41 AM Number of Addenda: 0

## 2023-05-14 LAB — SURGICAL PATHOLOGY

## 2023-05-16 ENCOUNTER — Encounter (HOSPITAL_COMMUNITY): Payer: Self-pay | Admitting: Gastroenterology

## 2023-05-20 ENCOUNTER — Encounter: Payer: Self-pay | Admitting: Gastroenterology

## 2023-05-22 ENCOUNTER — Other Ambulatory Visit: Payer: Self-pay | Admitting: Family Medicine

## 2023-05-22 DIAGNOSIS — E119 Type 2 diabetes mellitus without complications: Secondary | ICD-10-CM

## 2023-05-22 DIAGNOSIS — I10 Essential (primary) hypertension: Secondary | ICD-10-CM

## 2023-06-02 ENCOUNTER — Other Ambulatory Visit: Payer: Self-pay | Admitting: Internal Medicine

## 2023-06-07 ENCOUNTER — Ambulatory Visit: Admitting: Family Medicine

## 2023-06-07 ENCOUNTER — Ambulatory Visit: Payer: Medicare Other

## 2023-06-07 VITALS — BP 118/72 | HR 76 | Temp 97.2°F | Resp 18 | Wt 265.0 lb

## 2023-06-07 DIAGNOSIS — Z726 Gambling and betting: Secondary | ICD-10-CM | POA: Insufficient documentation

## 2023-06-07 DIAGNOSIS — E11610 Type 2 diabetes mellitus with diabetic neuropathic arthropathy: Secondary | ICD-10-CM

## 2023-06-07 DIAGNOSIS — E66813 Obesity, class 3: Secondary | ICD-10-CM

## 2023-06-07 DIAGNOSIS — Z7985 Long-term (current) use of injectable non-insulin antidiabetic drugs: Secondary | ICD-10-CM

## 2023-06-07 DIAGNOSIS — Z7984 Long term (current) use of oral hypoglycemic drugs: Secondary | ICD-10-CM

## 2023-06-07 DIAGNOSIS — E113313 Type 2 diabetes mellitus with moderate nonproliferative diabetic retinopathy with macular edema, bilateral: Secondary | ICD-10-CM

## 2023-06-07 DIAGNOSIS — Z6841 Body Mass Index (BMI) 40.0 and over, adult: Secondary | ICD-10-CM

## 2023-06-07 DIAGNOSIS — E1122 Type 2 diabetes mellitus with diabetic chronic kidney disease: Secondary | ICD-10-CM

## 2023-06-07 DIAGNOSIS — E1159 Type 2 diabetes mellitus with other circulatory complications: Secondary | ICD-10-CM

## 2023-06-07 DIAGNOSIS — I480 Paroxysmal atrial fibrillation: Secondary | ICD-10-CM | POA: Diagnosis not present

## 2023-06-07 DIAGNOSIS — I152 Hypertension secondary to endocrine disorders: Secondary | ICD-10-CM

## 2023-06-07 DIAGNOSIS — Z23 Encounter for immunization: Secondary | ICD-10-CM

## 2023-06-07 DIAGNOSIS — N1831 Chronic kidney disease, stage 3a: Secondary | ICD-10-CM

## 2023-06-07 LAB — POCT GLYCOSYLATED HEMOGLOBIN (HGB A1C): Hemoglobin A1C: 7.8 % — AB (ref 4.0–5.6)

## 2023-06-07 MED ORDER — OZEMPIC (0.25 OR 0.5 MG/DOSE) 2 MG/3ML ~~LOC~~ SOPN: 0.2500 mg | PEN_INJECTOR | SUBCUTANEOUS | 0 refills | Status: DC

## 2023-06-07 NOTE — Patient Instructions (Signed)
  VISIT SUMMARY: Today, we discussed your diabetes management and other health concerns. Your A1c has increased to 7.8, indicating that your diabetes is not well controlled. We talked about starting a new medication, Ozempic, to help manage your blood sugar levels, weight, and protect your heart and kidneys. We also reviewed your other health conditions, including chronic kidney disease, hypertension, coronary artery disease, and diabetic neuropathy. We discussed the importance of foot care and scheduled some follow-up tests and referrals.  YOUR PLAN: -TYPE 2 DIABETES MELLITUS WITH COMPLICATIONS: Your A1c level has increased to 7.8, which means your blood sugar levels are not well controlled. We will start you on Ozempic, an injectable medication taken once a week, to help manage your blood sugar, weight, and protect your heart and kidneys. If you tolerate Ozempic well, we will stop your glipizide . We also scheduled a referral to a foot doctor for foot care and a diabetic eye exam. We will check your B12 levels at your next visit.  -DIABETIC NEUROPATHY: Diabetic neuropathy is nerve damage caused by high blood sugar levels, leading to loss of sensation in your feet. This increases your risk of infections and complications. We will refer you to a foot doctor for proper foot care and check your B12 levels at your next visit.  -CHRONIC KIDNEY DISEASE, STAGE 3A: Chronic kidney disease stage 3a means your kidneys are moderately damaged. Ozempic may help protect your kidneys, so we will start you on this medication.  -OBESITY: Your weight is 265 lbs, which classifies as morbid obesity. Ozempic can help with weight loss, so we will start you on this medication.  -HYPERTENSION: Your blood pressure is well controlled at 118/72 mmHg with your current medications. No changes are needed at this time.  -CORONARY ARTERY DISEASE: Coronary artery disease is a condition where the blood vessels supplying your heart are  narrowed. You are taking atorvastatin  to manage your cholesterol levels, which are well controlled. We will continue this medication and check your cholesterol levels at your next visit.  INSTRUCTIONS: Please follow up with the foot doctor and eye doctor as scheduled. We will also need to do fasting lab work, including a B12 level and lipid panel, at your next visit. You received a pneumonia vaccine today, and we will consider a tetanus vaccine at your next visit. Additionally, we will schedule a PSA test at your next visit.                      Contains text generated by [employer].                                 Contains text generated by [employer].

## 2023-06-07 NOTE — Progress Notes (Signed)
 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 Current medications include metformin  500 mg BID, Jardiance  25 mg daily, and glipizide  10 mg BID. Discussed the potential benefits of Ozempic (semaglutide ) for glycemic control, weight loss, and cardiovascular and renal protection. Explained that Ozempic is an injectable medication taken once weekly, with common side effects including nausea. Emphasized the importance of starting at a low dose to minimize side effects. Discussed the cost-effectiveness of Ozempic compared to other weight loss medications and its long-standing presence in the market. - Initiate Ozempic at 0.25 mg weekly. - Discontinue glipizide  if Ozempic is tolerated. - Refer to podiatry for foot care and nail trimming. - Plan for fasting lab work at next visit, including B12 level. - Refer to ophthalmology for diabetic eye exam.  Diabetic neuropathy Exhibits signs of neuropathy, including loss of sensation in the feet. Discussed the increased risk of infection and the importance of foot care to prevent complications such as amputation. Noted the potential contribution of metformin  to B12 deficiency, which could exacerbate neuropathy. - Refer to podiatry for foot care and nail trimming. - Check B12 level at next visit.  Chronic kidney disease, stage 3a CKD stage 3a. Discussed the potential renal protective benefits of Ozempic. - Initiate Ozempic at 0.25 mg weekly.  Morbid obesity Weight is 265 lbs. Discussed potential weight loss benefits of Ozempic. - Initiate Ozempic at 0.25 mg weekly.  Hypertension Blood pressure is well controlled at 118/72 mmHg on current regimen of amlodipine  5 mg, hydrochlorothiazide  25 mg, losartan  100 mg, and metoprolol  50 mg daily.  Coronary  artery disease On atorvastatin  80 mg for hyperlipidemia. Last lipid panel in September showed well-controlled levels. No current side effects reported. - Continue atorvastatin  80 mg daily. - Plan for fasting lab work at next visit, including lipid panel.  Atrial fibrillation Current rate stable.  Follows with cardiology  - Continue Eliquis  5 mg twice daily  General Health Maintenance Discussed the need for routine vaccinations and screenings. Pneumonia and tetanus vaccinations were considered. - Administer pneumonia vaccine today. - Consider tetanus vaccine at next visit. - Schedule fasting lab work with PSA test at next visit.      There are no discontinued medications.  Return in about 1 month (around 07/08/2023) for DM, fasting labs.    Subjective:   Encounter date: 06/07/2023  Joe Stewart is a 68 y.o. male who has Hypertension associated with diabetes (HCC); Abnormal nuclear stress test; Type 2 diabetes mellitus with stage 3a chronic kidney disease, without long-term current use of insulin  (HCC); Arthritis of left knee; Cecal polyp; History of colon polyps; Abnormal colonoscopy; Anemia; Hyperlipidemia associated with type 2 diabetes mellitus (HCC); Snores; History of ischemic stroke; Familial hypercholesterolemia; Coronary artery disease involving native coronary artery of native heart without angina pectoris; Class 2 severe obesity with serious comorbidity in adult [facility]); Epistaxis; Paroxysmal atrial fibrillation (HCC); Hypercoagulable state due to paroxysmal atrial fibrillation (HCC); LVH (left ventricular hypertrophy); Dizzy spells; Abnormal LFTs; Chronic anticoagulation; Hx of adenomatous colonic polyps; Gambling and betting; Moderate nonproliferative diabetic retinopathy of both eyes with macular edema associated with type 2 diabetes mellitus (HCC); and 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   He  has a past medical history of  Arthritis, Cataract, Diabetes mellitus without complication (HCC), Diabetic retinopathy (HCC), Dysrhythmia,  Hyperlipidemia, Hypertension, Hypertensive retinopathy, Obesity, and Stroke (HCC).Joe Stewart   He presents with chief complaint of Medical Management of Chronic Issues (3 month follow up HTN, DM, HLD//HM due- eye exam, foot exam, vaccinations and screenings ) .   Discussed the use of AI scribe software for clinical note transcription with the patient, who gave verbal consent to proceed.  History of Present Illness Joe Stewart is a 68 year old male with diabetes who presents for diabetes management.  He has been experiencing uncontrolled diabetes with a current A1c of 7.8, which has improved from 8.6 a year ago. He attributes his limited exercise to walking around casinos. His current medications include metformin  500 mg BID, Jardiance  25 mg daily, and glipizide  10 mg BID.  He has a history of CKD3A, hypertension, familial hypercholesterolemia, coronary artery disease, and atrial fibrillation. His blood pressure is well controlled at 118/72 mmHg on amlodipine  5 mg, hydrochlorothiazide  25 mg, losartan  100 mg, and metoprolol  50 mg daily. For coronary artery disease and hyperlipidemia, he takes atorvastatin  80 mg daily.  He underwent a colonoscopy in April where three polyps were found, but no other issues were noted. No chest pain or shortness of breath.  He has a history of vision issues, receiving injections in both eyes, and has lost vision in his left eye due to a stroke. No recent changes in vision. He has not had an eye exam in over a year and is unsure if the injections were for macular degeneration.  He has a history of foot issues, including a scab on his right little toe from a previous injury. He experiences occasional numbness in his feet but no pain. He has not seen a foot doctor recently.  He has not had a pneumonia or tetanus shot recently.     ROS  Past Surgical History:   Procedure Laterality Date   BIOPSY  11/29/2019   Procedure: BIOPSY;  Surgeon: Brice Campi Albino Alu., MD;  Location: [facility];  Service: Gastroenterology;;   COLONOSCOPY WITH PROPOFOL  N/A 11/29/2019   Procedure: COLONOSCOPY WITH PROPOFOL ;  Surgeon: Normie Becton., MD;  Location: [facility];  Service: Gastroenterology;  Laterality: N/A;   COLONOSCOPY WITH PROPOFOL  N/A 05/13/2023   Procedure: COLONOSCOPY WITH PROPOFOL ;  Surgeon: Brice Campi Albino Alu., MD;  Location: [facility];  Service: Gastroenterology;  Laterality: N/A;   ENDOSCOPIC MUCOSAL RESECTION N/A 11/29/2019   Procedure: ENDOSCOPIC MUCOSAL RESECTION;  Surgeon: Brice Campi Albino Alu., MD;  Location: [facility];  Service: Gastroenterology;  Laterality: N/A;   ESOPHAGOGASTRODUODENOSCOPY (EGD) WITH PROPOFOL  N/A 11/29/2019   Procedure: ESOPHAGOGASTRODUODENOSCOPY (EGD) WITH PROPOFOL ;  Surgeon: Brice Campi Albino Alu., MD;  Location: [facility];  Service: Gastroenterology;  Laterality: N/A;   EYE SURGERY Left    had to have eye flushed after getting costic sodium in L eye   HEMOSTASIS CLIP PLACEMENT  11/29/2019   Procedure: HEMOSTASIS CLIP PLACEMENT;  Surgeon: Normie Becton., MD;  Location: [facility];  Service: Gastroenterology;;   HERNIA REPAIR     HERNIA REPAIR     35 years ago   HOT HEMOSTASIS N/A 11/29/2019   Procedure: HOT HEMOSTASIS (ARGON PLASMA COAGULATION/BICAP);  Surgeon: Normie Becton., MD;  Location: [facility];  Service: Gastroenterology;  Laterality: N/A;   JOINT REPLACEMENT  2021   Left knee replacement   LEFT HEART CATH AND CORONARY ANGIOGRAPHY N/A 04/11/2019   Procedure: LEFT HEART CATH AND CORONARY ANGIOGRAPHY;  Surgeon: Knox Perl, MD;  Location: [facility];  Service: Cardiovascular;  Laterality: N/A;  POLYPECTOMY  11/29/2019   Procedure: POLYPECTOMY;  Surgeon: Mansouraty, Albino Alu., MD;  Location: [facility];  Service: Gastroenterology;;   POLYPECTOMY   05/13/2023   Procedure: POLYPECTOMY, INTESTINE;  Surgeon: Normie Becton., MD;  Location: [facility];  Service: Gastroenterology;;   RENAL ANGIOGRAPHY Bilateral 04/11/2019   Procedure: RENAL ANGIOGRAPHY;  Surgeon: Knox Perl, MD;  Location: [facility];  Service: Cardiovascular;  Laterality: Bilateral;   SUBMUCOSAL LIFTING INJECTION  11/29/2019   Procedure: SUBMUCOSAL LIFTING INJECTION;  Surgeon: Normie Becton., MD;  Location: [facility];  Service: Gastroenterology;;   TOTAL KNEE ARTHROPLASTY Left 08/01/2019   Procedure: LEFT TOTAL KNEE ARTHROPLASTY;  Surgeon: Jasmine Mesi, MD;  Location: [facility] OR;  Service: Orthopedics;  Laterality: Left;    Outpatient Medications Prior to Visit  Medication Sig Dispense Refill   amLODipine  (NORVASC ) 5 MG tablet Take 1 tablet (5 mg total) by mouth daily. 90 tablet 3   atorvastatin  (LIPITOR ) 80 MG tablet TAKE 1 TABLET BY MOUTH EVERY DAY 90 tablet 1   blood glucose meter kit and supplies KIT Dispense based on patient and insurance preference. Use up to four times daily as directed. 1 each 0   ELIQUIS  5 MG TABS tablet TAKE 1 TABLET BY MOUTH TWICE A DAY 60 tablet 6   glipiZIDE  (GLUCOTROL ) 10 MG tablet Take 1 tablet (10 mg total) by mouth 2 (two) times daily before a meal. 180 tablet 3   hydrochlorothiazide  (HYDRODIURIL ) 25 MG tablet TAKE 1 TABLET (25 MG TOTAL) BY MOUTH DAILY. 90 tablet 3   JARDIANCE  25 MG TABS tablet TAKE 1 TABLET (25 MG TOTAL) BY MOUTH DAILY. 30 tablet 2   losartan  (COZAAR ) 100 MG tablet TAKE 1 TABLET BY MOUTH EVERY DAY 90 tablet 0   metFORMIN  (GLUCOPHAGE ) 500 MG tablet TAKE 1 TABLET BY MOUTH 2 TIMES DAILY WITH A MEAL. 180 tablet 3   metoprolol  succinate (TOPROL -XL) 50 MG 24 hr tablet Take 1 tablet (50 mg total) by mouth daily. Take with or immediately following a meal. 90 tablet 3   No facility-administered medications prior to visit.    Family History  Problem Relation Age of Onset   Diabetes Mother    COPD  Mother    Cancer Mother        unknown type    Heart disease Father    COPD Sister    Healthy Brother    Heart disease Brother    Healthy Son    Colon cancer Neg Hx    Colon polyps Neg Hx    Esophageal cancer Neg Hx    Rectal cancer Neg Hx    Stomach cancer Neg Hx    Inflammatory bowel disease Neg Hx    Liver disease Neg Hx    Pancreatic cancer Neg Hx     Social History   Socioeconomic History   Marital status: Single    Spouse name: Not on file   Number of children: 1   Years of education: Not on file   Highest education level: Some college, no degree  Occupational History   Not on file  Tobacco Use   Smoking status: Never   Smokeless tobacco: Never  Vaping Use   Vaping status: Never Used  Substance and Sexual Activity   Alcohol use: Not Currently   Drug use: Never   Sexual activity: Not Currently  Other Topics Concern   Not on file  Social History Narrative   Not on file   Social Drivers of  Health   Financial Resource Strain: Low Risk  (06/07/2023)   Overall Financial Resource Strain (CARDIA)    Difficulty of Paying Living Expenses: Not hard at all  Food Insecurity: No Food Insecurity (06/07/2023)   Hunger Vital Sign    Worried About Running Out of Food in the Last Year: Never true    Ran Out of Food in the Last Year: Never true  Transportation Needs: No Transportation Needs (06/07/2023)   PRAPARE - Administrator, Civil Service (Medical): No    Lack of Transportation (Non-Medical): No  Physical Activity: Insufficiently Active (06/07/2023)   Exercise Vital Sign    Days of Exercise per Week: 2 days    Minutes of Exercise per Session: 20 min  Stress: No Stress Concern Present (06/07/2023)   [employer] of Occupational Health - Occupational Stress Questionnaire    Feeling of Stress : Not at all  Social Connections: Moderately Isolated (06/07/2023)   Social Connection and Isolation Panel [NHANES]    Frequency of Communication with Friends and Family:  More than three times a week    Frequency of Social Gatherings with Friends and Family: Three times a week    Attends Religious Services: 1 to 4 times per year    Active Member of Clubs or Organizations: No    Attends Banker Meetings: Never    Marital Status: Divorced  Catering manager Violence: Not At Risk (05/28/2021)   Humiliation, Afraid, Rape, and Kick questionnaire    Fear of Current or Ex-Partner: No    Emotionally Abused: No    Physically Abused: No    Sexually Abused: No                                                                                                  Objective:  Physical Exam: BP 118/72 (BP Location: Left Arm, Patient Position: Sitting, Cuff Size: Large) Comment: recheck after 10-15 resting  Pulse 76   Temp (!) 97.2 F (36.2 C) (Temporal)   Resp 18   Wt 265 lb (120.2 kg)   SpO2 99%   BMI 40.29 kg/m   Wt Readings from Last 3 Encounters:  06/07/23 265 lb (120.2 kg)  05/13/23 264 lb 15.9 oz (120.2 kg)  04/02/23 265 lb (120.2 kg)      Title   Diabetic Foot Exam - detailed Date & Time: 06/07/2023  4:52 PM Diabetic Foot exam was performed with the following findings: Yes  Visual Foot Exam completed.: Yes  Is there a history of foot ulcer?: Yes Is there a foot ulcer now?: No Is there swelling?: No Is there elevated skin temperature?: No Is there abnormal foot shape?: No Is there a claw toe deformity?: Yes Are the toenails long?: Yes Are the toenails thick?: Yes Are the toenails ingrown?: Yes Is the skin thin, fragile, shiny and hairless?": No Normal Range of Motion?: Yes Is there foot or ankle muscle weakness?: No Do you have pain in calf while walking?: No Are the shoes appropriate in style and fit?: Yes Can the patient see the bottom of their feet?: Yes  Pulse Foot Exam completed.: Yes   Right Posterior Tibialis: Present Left posterior Tibialis: Present   Right Dorsalis Pedis: Present Left Dorsalis Pedis: Present     Sensory  Foot Exam Completed.: Yes Semmes-Weinstein Monofilament Test "+" means "has sensation" and "-" means "no sensation"  R Foot Test Control: Neg L Foot Test Control: Neg   R Site 1-Great Toe: Neg L Site 1-Great Toe: Neg   R Site 4: Neg L Site 4: Neg   R site 5: Neg L Site 5: Neg  R Site 6: Neg L Site 6: Neg     Image components are not supported.   Image components are not supported. Image components are not supported.  [place] Comments      Physical Exam Constitutional:      Appearance: Normal appearance.  HENT:     Head: Normocephalic and atraumatic.     Right Ear: Hearing normal.     Left Ear: Hearing normal.     Nose: Nose normal.  Eyes:     General: No scleral icterus.       Right eye: No discharge.        Left eye: No discharge.     Extraocular Movements: Extraocular movements intact.  Cardiovascular:     Rate and Rhythm: Normal rate and regular rhythm.     Heart sounds: Normal heart sounds.     Comments: Bilateral varicose veins Pulmonary:     Effort: Pulmonary effort is normal.     Breath sounds: Normal breath sounds.  Abdominal:     Palpations: Abdomen is soft.     Tenderness: There is no abdominal tenderness.  Musculoskeletal:     Right lower leg: 1+ Pitting Edema present.     Left lower leg: 1+ Pitting Edema present.  Skin:    General: Skin is warm.     Findings: No rash or wound. Scaling rash: Bilateral lower extremities.  Neurological:     General: No focal deficit present.     Mental Status: He is alert.     Cranial Nerves: No cranial nerve deficit.  Psychiatric:        Mood and Affect: Mood normal.        Behavior: Behavior normal.        Thought Content: Thought content normal.        Judgment: Judgment normal.     CUP PACEART REMOTE DEVICE CHECK Result Date: 05/10/2023 ILR summary report received. Battery status OK. Normal device function. No new symptom, tachy, brady, or pause episodes. No new AF episodes. Monthly summary reports and  ROV/PRN ML, CVRS  CUP PACEART REMOTE DEVICE CHECK Result Date: 04/05/2023 ILR summary report received. Battery status OK. Normal device function. No new symptom, tachy, brady, or pause episodes. No new AF episodes. Monthly summary reports and ROV/PRN - CS, CVRS   Recent Results (from the past 2160 hours)  Gamma GT     Status: Abnormal   Collection Time: 04/02/23 10:46 AM  Result Value Ref Range   GGT 103 (H) 7 - 51 U/L  CK (Creatine Kinase)     Status: None   Collection Time: 04/02/23 10:46 AM  Result Value Ref Range   Total CK 155 7 - 232 U/L  Hepatitis B Core Antibody, total     Status: None   Collection Time: 04/02/23 10:46 AM  Result Value Ref Range   Hep B Core Total Ab NON-REACTIVE NON-REACTIVE    Comment: . For additional information, please refer  to  http://education.[facility].com/faq/FAQ202  (This link is being provided for informational/ educational purposes only.) .   Hepatitis B Surface AntiBODY     Status: None   Collection Time: 04/02/23 10:46 AM  Result Value Ref Range   Hep B S Ab NON-REACTIVE NON-REACTIVE  Hepatitis A Antibody, Total     Status: None   Collection Time: 04/02/23 10:46 AM  Result Value Ref Range   Hepatitis A AB,Total NON-REACTIVE NON-REACTIVE    Comment: . For additional information, please refer to  http://education.[facility].com/faq/FAQ202  (This link is being provided for informational/ educational purposes only.) .   INR/PT     Status: Abnormal   Collection Time: 04/02/23 10:46 AM  Result Value Ref Range   INR 1.8 (H) 0.8 - 1.0 ratio   Prothrombin Time 18.8 (H) 9.6 - 13.1 sec  Tissue transglutaminase, IgA     Status: None   Collection Time: 04/02/23 10:46 AM  Result Value Ref Range   (tTG) Ab, IgA <1.0 U/mL    Comment: Value          Interpretation -----          -------------- <15.0          Antibody not detected > or = 15.0    Antibody detected .   IgA     Status: None   Collection Time: 04/02/23 10:46 AM   Result Value Ref Range   Immunoglobulin A 264 70 - 320 mg/dL  TSH     Status: None   Collection Time: 04/02/23 10:46 AM  Result Value Ref Range   TSH 3.48 0.35 - 5.50 uIU/mL  Comp Met (CMET)     Status: Abnormal   Collection Time: 04/02/23 10:46 AM  Result Value Ref Range   Sodium 139 135 - 145 mEq/L   Potassium 4.5 3.5 - 5.1 mEq/L   Chloride 103 96 - 112 mEq/L   CO2 26 19 - 32 mEq/L   Glucose, Bld 146 (H) 70 - 99 mg/dL   BUN 23 6 - 23 mg/dL   Creatinine, Ser 2.53 0.40 - 1.50 mg/dL   Total Bilirubin 0.7 0.2 - 1.2 mg/dL   Alkaline Phosphatase 91 39 - 117 U/L   AST 40 (H) 0 - 37 U/L   ALT 42 0 - 53 U/L   Total Protein 8.7 (H) 6.0 - 8.3 g/dL   Albumin 4.4 3.5 - 5.2 g/dL   GFR 66.44 >03.47 mL/min    Comment: Calculated using the CKD-EPI Creatinine Equation (2021)   Calcium  9.4 8.4 - 10.5 mg/dL  CBC     Status: None   Collection Time: 04/02/23 10:46 AM  Result Value Ref Range   WBC 8.9 4.0 - 10.5 K/uL   RBC 4.45 4.22 - 5.81 Mil/uL   Platelets 219.0 150.0 - 400.0 K/uL   Hemoglobin 14.0 13.0 - 17.0 g/dL   HCT 42.5 95.6 - 38.7 %   MCV 95.1 78.0 - 100.0 fl   MCHC 33.2 30.0 - 36.0 g/dL   RDW 56.4 33.2 - 95.1 %  CUP PACEART REMOTE DEVICE CHECK     Status: None   Collection Time: 04/04/23 11:09 PM  Result Value Ref Range   Date Time Interrogation Session [id number]    Pulse Generator Manufacturer MERM    Pulse Gen Model LNQ22 LINQ II    Pulse Gen Serial Number [id number]    Clinic Name [facility]    Implantable Pulse Generator Type ICM/ILR    Implantable Pulse Generator Implant Date  [id number]   CUP PACEART REMOTE DEVICE CHECK     Status: None   Collection Time: 05/09/23 11:10 PM  Result Value Ref Range   Date Time Interrogation Session [id number]    Pulse Generator Manufacturer MERM    Pulse Gen Model LNQ22 LINQ II    Pulse Gen Serial Number [id number]    Clinic Name [facility]    Implantable Pulse Generator Type ICM/ILR    Implantable Pulse Generator  Implant Date [id number]    Eval Rhythm SR at 70 bpm   Glucose, capillary     Status: Abnormal   Collection Time: 05/13/23  8:31 AM  Result Value Ref Range   Glucose-Capillary 139 (H) 70 - 99 mg/dL    Comment: Glucose reference range applies only to samples taken after fasting for at least 8 hours.  Surgical pathology     Status: None   Collection Time: 05/13/23  9:42 AM  Result Value Ref Range   SURGICAL PATHOLOGY      SURGICAL PATHOLOGY CASE: [id number] PATIENT: Vidur Rainone Surgical Pathology Report     Clinical History: Cecal polyps, abnormal LFTs (las)     FINAL MICROSCOPIC DIAGNOSIS:  A. COLON, POLYPECTOMY: Tubular adenoma (2) without high grade dysplasia.   GROSS DESCRIPTION:  Received in formalin are 2 tan-pink polypoid tissues, 0.5 and 1.4 cm in length, and range from 0.2 to 0.3 cm thick.  In toto, one block.  SW 05/13/2023   Final Diagnosis performed by Ramey [place], MD.   Electronically signed 05/14/2023 Technical component performed at [facility], [address] [place], [place] [address].  Professional component performed at [employer]. [facility], [address] [place], [place] [address].  Immunohistochemistry Technical component (if applicable) was performed at [facility]. [address], [place], [place] [address].   IMMUNOHISTOCHEMISTRY DISCLAIMER (if applicable): Some of these immunohisto chemical stains may have been developed and the performance characteristics determine by [facility]. Some may not have been cleared or approved by the U.S. Food and Drug Administration. The FDA has determined that such clearance or approval is not necessary. This test is used for clinical purposes. It should not be regarded as investigational or for research. This laboratory is certified under the Clinical Laboratory Improvement Amendments of 1988 (CLIA-88) as qualified to perform high  complexity clinical laboratory testing.  The controls stained appropriately.   IHC stains are performed on formalin fixed, paraffin embedded tissue using a 3,3"diaminobenzidine (DAB) chromogen and Leica Bond Autostainer System. The staining intensity of the nucleus is score manually and is reported as the percentage of tumor cell nuclei demonstrating specific nuclear staining. The specimens are fixed in 10% Neutral Formalin for at least 6 hours and up to 72hrs. These tests are  validated on decalcified tissue. Results should be interpreted with caution given the possibility of false negative results on decalcified specimens. Antibody Clones are as follows ER-clone [id number], PR-clone 16, Ki67- clone [id number]. Some of these immunohistochemical stains may have been developed and the performance characteristics determined by [facility].   POCT glycosylated hemoglobin (Hb A1C)     Status: Abnormal   Collection Time: 06/07/23  4:13 PM  Result Value Ref Range   Hemoglobin A1C 7.8 (A) 4.0 - 5.6 %   HbA1c POC (<> result, manual entry)     HbA1c, POC (prediabetic range)     HbA1c, POC (controlled diabetic range)          Carnell Christian, MD,  MS

## 2023-06-14 ENCOUNTER — Ambulatory Visit (INDEPENDENT_AMBULATORY_CARE_PROVIDER_SITE_OTHER): Payer: Medicare Other

## 2023-06-14 DIAGNOSIS — I639 Cerebral infarction, unspecified: Secondary | ICD-10-CM | POA: Diagnosis not present

## 2023-06-15 ENCOUNTER — Ambulatory Visit: Payer: Self-pay | Admitting: Internal Medicine

## 2023-06-15 LAB — CUP PACEART REMOTE DEVICE CHECK
Date Time Interrogation Session: 20250511234039
Implantable Pulse Generator Implant Date: 20230407

## 2023-06-22 ENCOUNTER — Other Ambulatory Visit: Payer: Self-pay | Admitting: Family Medicine

## 2023-06-22 DIAGNOSIS — N1831 Chronic kidney disease, stage 3a: Secondary | ICD-10-CM

## 2023-06-22 DIAGNOSIS — E1159 Type 2 diabetes mellitus with other circulatory complications: Secondary | ICD-10-CM

## 2023-06-26 ENCOUNTER — Other Ambulatory Visit: Payer: Self-pay | Admitting: Family Medicine

## 2023-06-26 DIAGNOSIS — E119 Type 2 diabetes mellitus without complications: Secondary | ICD-10-CM

## 2023-06-26 DIAGNOSIS — I10 Essential (primary) hypertension: Secondary | ICD-10-CM

## 2023-06-29 ENCOUNTER — Other Ambulatory Visit: Payer: Self-pay | Admitting: Internal Medicine

## 2023-06-29 NOTE — Progress Notes (Signed)
 [facility] Summary Report / Loop Recorder

## 2023-06-30 ENCOUNTER — Ambulatory Visit: Admitting: Podiatry

## 2023-06-30 DIAGNOSIS — L84 Corns and callosities: Secondary | ICD-10-CM

## 2023-06-30 DIAGNOSIS — E119 Type 2 diabetes mellitus without complications: Secondary | ICD-10-CM

## 2023-06-30 NOTE — Progress Notes (Signed)
  Subjective:  Patient ID: Joe Stewart, male    DOB: 1955/12/02,   MRN: [id number]  Chief Complaint  Patient presents with   Callouses    Right foot 5th toe pt stated that he has this hard place on his toe that he can not get rid of he stated that it does not cause him any pain its just there.    68 y.o. male presents for follow-up of right foot  calluse. Denies pain. Has not followed up in a while. He is diabetic and last A1c was 7.4  . Denies any other pedal complaints. Denies n/v/f/c.   PCP: Ulyess Gammons MD   Past Medical History:  Diagnosis Date   Arthritis    Cataract    Mixed OU   Diabetes mellitus without complication (HCC)    Diabetic retinopathy (HCC)    NPDR OU   Dysrhythmia    afib   Hyperlipidemia    Hypertension    Hypertensive retinopathy    OU   Obesity    Stroke [facility])     Objective:  Physical Exam: Vascular: DP/PT pulses 2/4 bilateral. CFT <3 seconds. Normal hair growth on digits. No edema.  Skin. No lacerations or abrasions bilateral feet. Right fifth lateral digit with wound healed. Undelrying hyperkeratotic tissue Some erythema and edema noted.  Musculoskeletal: MMT 5/5 bilateral lower extremities in DF, PF, Inversion and Eversion. Deceased ROM in DF of ankle joint.  Neurological: Sensation intact to light touch.   Assessment:   1. Pre-ulcerative calluses   2. Type 2 diabetes mellitus without complication, without long-term current use of insulin  (HCC)           Plan:  Patient was evaluated and treated and all questions answered. Ulcer right fifth toe- healed.  -Debridement of hyperkeratatotic tissue with healed ulcer underlying but very close to opening.  -Will keep toe cap over area for protection.  -Discussed glucose control and proper protein-rich diet.  -Discussed if any worsening redness, pain, fever or chills to call or may need to report to the emergency room. Patient expressed understanding.  -follow-up in 6 weeks for recheck and  rfc.   No follow-ups on file.  No follow-ups on file.   Jennefer Moats, DPM

## 2023-07-03 ENCOUNTER — Other Ambulatory Visit: Payer: Self-pay | Admitting: Family Medicine

## 2023-07-03 DIAGNOSIS — N1831 Chronic kidney disease, stage 3a: Secondary | ICD-10-CM

## 2023-07-08 ENCOUNTER — Ambulatory Visit: Payer: Self-pay | Admitting: Family Medicine

## 2023-07-08 ENCOUNTER — Ambulatory Visit: Admitting: Family Medicine

## 2023-07-08 ENCOUNTER — Ambulatory Visit (HOSPITAL_COMMUNITY)
Admission: RE | Admit: 2023-07-08 | Discharge: 2023-07-08 | Disposition: A | Discharge status: Home / Self Care | Payer: Medicare Other | Source: Ambulatory Visit | Attending: Internal Medicine | Admitting: Internal Medicine

## 2023-07-08 VITALS — BP 132/70 | HR 76 | Temp 98.0°F | Ht 68.0 in | Wt 262.0 lb

## 2023-07-08 VITALS — BP 128/74 | HR 77 | Ht 68.0 in | Wt 263.6 lb

## 2023-07-08 DIAGNOSIS — E1122 Type 2 diabetes mellitus with diabetic chronic kidney disease: Secondary | ICD-10-CM | POA: Diagnosis not present

## 2023-07-08 DIAGNOSIS — I480 Paroxysmal atrial fibrillation: Secondary | ICD-10-CM

## 2023-07-08 DIAGNOSIS — E1159 Type 2 diabetes mellitus with other circulatory complications: Secondary | ICD-10-CM | POA: Diagnosis not present

## 2023-07-08 DIAGNOSIS — R972 Elevated prostate specific antigen [PSA]: Secondary | ICD-10-CM

## 2023-07-08 DIAGNOSIS — Z5181 Encounter for therapeutic drug level monitoring: Secondary | ICD-10-CM

## 2023-07-08 DIAGNOSIS — Z125 Encounter for screening for malignant neoplasm of prostate: Secondary | ICD-10-CM | POA: Diagnosis not present

## 2023-07-08 DIAGNOSIS — Z7985 Long-term (current) use of injectable non-insulin antidiabetic drugs: Secondary | ICD-10-CM | POA: Diagnosis not present

## 2023-07-08 DIAGNOSIS — I4891 Unspecified atrial fibrillation: Secondary | ICD-10-CM

## 2023-07-08 DIAGNOSIS — D6869 Other thrombophilia: Secondary | ICD-10-CM

## 2023-07-08 DIAGNOSIS — Z7984 Long term (current) use of oral hypoglycemic drugs: Secondary | ICD-10-CM

## 2023-07-08 DIAGNOSIS — I152 Hypertension secondary to endocrine disorders: Secondary | ICD-10-CM | POA: Diagnosis not present

## 2023-07-08 DIAGNOSIS — N1831 Chronic kidney disease, stage 3a: Secondary | ICD-10-CM

## 2023-07-08 LAB — LIPID PANEL
Cholesterol: 98 mg/dL (ref 0–200)
HDL: 28.3 mg/dL — ABNORMAL LOW (ref >39.00)
LDL Cholesterol: 48 mg/dL (ref 0–99)
NonHDL: 69.59
Total CHOL/HDL Ratio: 3
Triglycerides: 110 mg/dL (ref 0.0–149.0)
VLDL: 22 mg/dL (ref 0.0–40.0)

## 2023-07-08 LAB — MICROALBUMIN / CREATININE URINE RATIO
Creatinine,U: 116 mg/dL
Microalb Creat Ratio: 101.8 mg/g — ABNORMAL HIGH (ref 0.0–30.0)
Microalb, Ur: 11.8 mg/dL — ABNORMAL HIGH (ref 0.0–1.9)

## 2023-07-08 LAB — PSA: PSA: 8.01 ng/mL — ABNORMAL HIGH (ref 0.10–4.00)

## 2023-07-08 LAB — VITAMIN B12: Vitamin B-12: 435 pg/mL (ref 211–911)

## 2023-07-08 MED ORDER — OZEMPIC (0.25 OR 0.5 MG/DOSE) 2 MG/3ML ~~LOC~~ SOPN: 0.5000 mg | PEN_INJECTOR | SUBCUTANEOUS | 0 refills | Status: DC

## 2023-07-08 NOTE — Progress Notes (Signed)
 Primary Care Physician: Catheryn Cluck, MD Primary Cardiologist: Dr. Paulita Boss Primary Electrophysiologist: Dr. Carolynne Citron Referring Physician:    Oval Cavazos is a 68 y.o. male with a history of moderate nonobstructive CAD, CKD stage IIIa, DM2, HTN, familial triglyceridemia, prior stroke s/p ILR placement, and paroxysmal atrial fibrillation who presents for consultation in the [facility]. He was evaluated by Dr. Carolynne Citron on 05/09/21 for cryptogenic stroke and had ILR placed. The patient was recently diagnosed with atrial fibrillation due to ILR alert demonstrating ~6 hr episode of Afib on 05/09/22 with max HR 130. Patient has a CHADS2VASC score of 6.  On evaluation 05/12/22, he is in SR today. ILR alert for new episode of Afib was for ~6 hours in duration and began at 2345 that evening. He states he did not feel that episode and was most likely asleep. He had won $1,300 earlier that evening playing blackjack in casino but did not feel any symptoms at that time. He is currently on ASA 81 mg daily and had a nosebleed 3 weeks ago for the first time which was odd to him. No more epistaxis since then. He does not drink alcohol. His brother and father were diagnosed with Afib; father is deceased. Some snoring with sleep but seems to have improved now that he is sleeping on his side.   On follow up 06/10/22, he is in SR today. ILR review through [employer] site today shows no new episodes of Afib since initial episode on 05/09/22. He feels well overall. He is compliant with Toprol  and no missed doses of Eliquis .   On follow up 12/10/22, he is currently in NSR. ILR review through [employer] site today shows no new episodes of Afib since June. He feels well overall. He is compliant with Toprol  and Eliquis . No new episodes of epistaxis.  On follow up 07/08/23, he is currently in NSR. Recent ILR review shows no new episodes of Afib. Patient feels well overall. No bleeding issues on Eliquis .    Today, he denies symptoms of palpitations, chest pain, shortness of breath, orthopnea, PND, lower extremity edema, dizziness, presyncope, syncope, snoring, daytime somnolence, bleeding, or neurologic sequela. The patient is tolerating medications without difficulties and is otherwise without complaint today.   Atrial Fibrillation Risk Factors:  he does not have symptoms or diagnosis of sleep apnea. he does not have a history of rheumatic fever. he does not have a history of alcohol use. The patient does have a history of early familial atrial fibrillation or other arrhythmias.  he has a BMI of Body mass index is 40.08 kg/m.Joe Stewart Filed Weights   07/08/23 1036  Weight: 119.6 kg     Family History  Problem Relation Age of Onset   Diabetes Mother    COPD Mother    Cancer Mother        unknown type    Heart disease Father    COPD Sister    Healthy Brother    Heart disease Brother    Healthy Son    Colon cancer Neg Hx    Colon polyps Neg Hx    Esophageal cancer Neg Hx    Rectal cancer Neg Hx    Stomach cancer Neg Hx    Inflammatory bowel disease Neg Hx    Liver disease Neg Hx    Pancreatic cancer Neg Hx      Atrial Fibrillation Management history:  Previous antiarrhythmic drugs: None Previous cardioversions: None Previous ablations: None Anticoagulation history: Eliquis  5  mg BID   Past Medical History:  Diagnosis Date   Arthritis    Cataract    Mixed OU   Diabetes mellitus without complication (HCC)    Diabetic retinopathy (HCC)    NPDR OU   Dysrhythmia    afib   Hyperlipidemia    Hypertension    Hypertensive retinopathy    OU   Obesity    Stroke [facility])    Past Surgical History:  Procedure Laterality Date   BIOPSY  11/29/2019   Procedure: BIOPSY;  Surgeon: Normie Becton., MD;  Location: [facility];  Service: Gastroenterology;;   COLONOSCOPY WITH PROPOFOL  N/A 11/29/2019   Procedure: COLONOSCOPY WITH PROPOFOL ;  Surgeon: Normie Becton.,  MD;  Location: [facility];  Service: Gastroenterology;  Laterality: N/A;   COLONOSCOPY WITH PROPOFOL  N/A 05/13/2023   Procedure: COLONOSCOPY WITH PROPOFOL ;  Surgeon: Mansouraty, Albino Alu., MD;  Location: [facility];  Service: Gastroenterology;  Laterality: N/A;   ENDOSCOPIC MUCOSAL RESECTION N/A 11/29/2019   Procedure: ENDOSCOPIC MUCOSAL RESECTION;  Surgeon: Brice Campi Albino Alu., MD;  Location: [facility];  Service: Gastroenterology;  Laterality: N/A;   ESOPHAGOGASTRODUODENOSCOPY (EGD) WITH PROPOFOL  N/A 11/29/2019   Procedure: ESOPHAGOGASTRODUODENOSCOPY (EGD) WITH PROPOFOL ;  Surgeon: Brice Campi Albino Alu., MD;  Location: [facility];  Service: Gastroenterology;  Laterality: N/A;   EYE SURGERY Left    had to have eye flushed after getting costic sodium in L eye   HEMOSTASIS CLIP PLACEMENT  11/29/2019   Procedure: HEMOSTASIS CLIP PLACEMENT;  Surgeon: Normie Becton., MD;  Location: [facility];  Service: Gastroenterology;;   HERNIA REPAIR     HERNIA REPAIR     35 years ago   HOT HEMOSTASIS N/A 11/29/2019   Procedure: HOT HEMOSTASIS (ARGON PLASMA COAGULATION/BICAP);  Surgeon: Normie Becton., MD;  Location: [facility];  Service: Gastroenterology;  Laterality: N/A;   JOINT REPLACEMENT  2021   Left knee replacement   LEFT HEART CATH AND CORONARY ANGIOGRAPHY N/A 04/11/2019   Procedure: LEFT HEART CATH AND CORONARY ANGIOGRAPHY;  Surgeon: Knox Perl, MD;  Location: [facility];  Service: Cardiovascular;  Laterality: N/A;   POLYPECTOMY  11/29/2019   Procedure: POLYPECTOMY;  Surgeon: Brice Campi Albino Alu., MD;  Location: [facility];  Service: Gastroenterology;;   POLYPECTOMY  05/13/2023   Procedure: POLYPECTOMY, INTESTINE;  Surgeon: Normie Becton., MD;  Location: [facility];  Service: Gastroenterology;;   RENAL ANGIOGRAPHY Bilateral 04/11/2019   Procedure: RENAL ANGIOGRAPHY;  Surgeon: Knox Perl, MD;  Location[facility] CV LAB;  Service:  Cardiovascular;  Laterality: Bilateral;   SUBMUCOSAL LIFTING INJECTION  11/29/2019   Procedure: SUBMUCOSAL LIFTING INJECTION;  Surgeon: Normie Becton., MD;  Location: [facility];  Service: Gastroenterology;;   TOTAL KNEE ARTHROPLASTY Left 08/01/2019   Procedure: LEFT TOTAL KNEE ARTHROPLASTY;  Surgeon: Jasmine Mesi, MD;  Location: [facility] OR;  Service: Orthopedics;  Laterality: Left;    Current Outpatient Medications  Medication Sig Dispense Refill   amLODipine  (NORVASC ) 5 MG tablet TAKE 1 TABLET (5 MG TOTAL) BY MOUTH DAILY. 90 tablet 3   atorvastatin  (LIPITOR ) 80 MG tablet TAKE 1 TABLET BY MOUTH EVERY DAY 90 tablet 1   blood glucose meter kit and supplies KIT Dispense based on patient and insurance preference. Use up to four times daily as directed. 1 each 0   ELIQUIS  5 MG TABS tablet TAKE 1 TABLET BY MOUTH TWICE A DAY 60 tablet 6   glipiZIDE  (GLUCOTROL ) 10 MG tablet Take 1 tablet (10 mg total) by mouth 2 (  two) times daily before a meal. 180 tablet 3   hydrochlorothiazide  (HYDRODIURIL ) 25 MG tablet TAKE 1 TABLET (25 MG TOTAL) BY MOUTH DAILY. 90 tablet 3   JARDIANCE  25 MG TABS tablet TAKE 1 TABLET (25 MG TOTAL) BY MOUTH DAILY. 30 tablet 2   losartan  (COZAAR ) 100 MG tablet TAKE 1 TABLET BY MOUTH EVERY DAY 90 tablet 0   metFORMIN  (GLUCOPHAGE ) 500 MG tablet TAKE 1 TABLET BY MOUTH 2 TIMES DAILY WITH A MEAL. 180 tablet 3   metoprolol  succinate (TOPROL -XL) 50 MG 24 hr tablet TAKE 1 TABLET BY MOUTH DAILY. TAKE WITH OR IMMEDIATELY FOLLOWING A MEAL. 90 tablet 3   Semaglutide ,0.25 or 0.5MG /DOS, (OZEMPIC , 0.25 OR 0.5 MG/DOSE,) 2 MG/3ML SOPN Inject 0.5 mg into the skin once a week. 6 mL 0   No current facility-administered medications for this encounter.    No Known Allergies  ROS- All systems are reviewed and negative except as per the HPI above.  Physical Exam: Vitals:   07/08/23 1036  BP: 128/74  Pulse: 77  Weight: 119.6 kg  Height: 5\' 8"  (1.727 m)    GEN- The patient is well  appearing, alert and oriented x 3 today.   Neck - no JVD or carotid bruit noted Lungs- Clear to ausculation bilaterally, normal work of breathing Heart- Regular rate and rhythm, no murmurs, rubs or gallops, PMI not laterally displaced Extremities- no clubbing, cyanosis, or edema Skin - no rash or ecchymosis noted   Wt Readings from Last 3 Encounters:  07/08/23 119.6 kg  07/08/23 118.8 kg  06/07/23 120.2 kg    EKG today demonstrates: Vent. rate 77 BPM PR interval 222 ms QRS duration 140 ms QT/QTcB 412/466 ms P-R-T axes 20 -53 -8 Sinus rhythm with 1st degree A-V block Left axis deviation Right bundle branch block Abnormal ECG When compared with ECG of 10-Dec-2022 10:46, No significant change was found  Echo 02/20/21 demonstrated:  1. Moderate concentric LVH with proximal septal thickening.   2. Left ventricular ejection fraction, by estimation, is 70 to 75%. The  left ventricle has hyperdynamic function. The left ventricle has no  regional wall motion abnormalities. There is moderate concentric left  ventricular hypertrophy. Left ventricular  diastolic parameters are consistent with Grade I diastolic dysfunction  (impaired relaxation). Elevated left atrial pressure.   3. Right ventricular systolic function is normal. The right ventricular  size is normal.   4. The mitral valve is normal in structure. No evidence of mitral valve  regurgitation. No evidence of mitral stenosis.   5. The aortic valve is tricuspid. Aortic valve regurgitation is not  visualized. Aortic valve sclerosis/calcification is present, without any  evidence of aortic stenosis.   6. The inferior vena cava is normal in size with greater than 50%  respiratory variability, suggesting right atrial pressure of 3 mmHg.    Epic records are reviewed at length today.  CHA2DS2-VASc Score = 6  The patient's score is based upon: CHF History: 0 HTN History: 1 Diabetes History: 1 Stroke History: 2 Vascular  Disease History: 1 Age Score: 1 Gender Score: 0       ASSESSMENT AND PLAN: Paroxysmal Atrial Fibrillation (ICD10:  I48.0) The patient's CHA2DS2-VASc score is 6, indicating a 9.7% annual risk of stroke.    He is currently in NSR. Continue Toprol  50 mg daily.   2. Secondary Hypercoagulable State (ICD10:  D68.69) The patient is at significant risk for stroke/thromboembolism based upon his CHA2DS2-VASc Score of 6.  Start Apixaban  (Eliquis ).  No bleeding issues on Eliquis  5 mg BID.   3. Obesity Body mass index is 40.08 kg/m. Encouraged daily walking.   4. Snoring with concern for Obstructive sleep apnea He defers sleep study at this time.   5. HTN Stable readings in clinic and at home.   Follow up in 6 months Afib clinic.   Minnie Amber, PA-C [facility] [address] [place], [place] [address] [phone] 07/08/2023 11:00 AM

## 2023-07-08 NOTE — Progress Notes (Signed)
 Assessment & Plan   Assessment/Plan:     Assessment & Plan Type 2 Diabetes Mellitus with obesity Type 2 diabetes mellitus managed with Ozempic  0.25 mg weekly. A1c previously elevated at 7%. Recent weight loss of 3 pounds over one month. No gastrointestinal side effects from Ozempic . Plan to increase Ozempic  to 0.5 mg weekly to improve glycemic control and aid in weight loss. Discussed potential side effects of increased dose, including mild nausea, and advised on management strategies such as eating small meals. Explained that side effects tend to improve over time. - Increase Ozempic  to 0.5 mg weekly for two months - Monitor for side effects such as mild nausea - Advise on eating small meals to manage potential side effects  Hyperlipidemia Hyperlipidemia management with atorvastatin  8 mg. Fasting lipid panel was not done previously due to non-fasting state. Current visit is to check lipid levels while fasting. - Order fasting lipid panel  Chronic Kidney Disease Monitoring Urine test needed to assess kidney function, specifically for proteinuria using microalbumin creatinine ratio and urinalysis. - Obtain urine sample for microalbumin creatinine ratio and urinalysis  Vitamin B12 Monitoring Previously elevated B12 levels with cessation of supplementation. Recheck B12 levels due to potential metformin -induced deficiency. No anemia noted in previous blood work. - Recheck B12 levels  Prostate Cancer Screening Prostate cancer screening with PSA has not been done in a couple of years. Plan to add PSA test for screening purposes. - Order PSA test for prostate cancer screening  Follow-up Follow-up visit planned to assess response to increased Ozempic  dose and review lab results. - Schedule follow-up visit in two months      Medications Discontinued During This Encounter  Medication Reason   Semaglutide ,0.25 or 0.5MG /DOS, (OZEMPIC , 0.25 OR 0.5 MG/DOSE,) 2 MG/3ML SOPN     Return in  about 2 months (around 09/07/2023) for DM.        Subjective:   Encounter date: 07/08/2023  Joe Stewart is a 68 y.o. male who has Hypertension associated with diabetes (HCC); Abnormal nuclear stress test; Type 2 diabetes mellitus with stage 3a chronic kidney disease, without long-term current use of insulin  (HCC); Arthritis of left knee; Cecal polyp; History of colon polyps; Abnormal colonoscopy; Anemia; Hyperlipidemia associated with type 2 diabetes mellitus (HCC); Snores; History of ischemic stroke; Familial hypercholesterolemia; Coronary artery disease involving native coronary artery of native heart without angina pectoris; Class 2 severe obesity with serious comorbidity in adult [facility]); Epistaxis; Paroxysmal atrial fibrillation (HCC); Hypercoagulable state due to paroxysmal atrial fibrillation (HCC); LVH (left ventricular hypertrophy); Dizzy spells; Abnormal LFTs; Chronic anticoagulation; Hx of adenomatous colonic polyps; Gambling and betting; Moderate nonproliferative diabetic retinopathy of both eyes with macular edema associated with type 2 diabetes mellitus (HCC); and 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   He  has a past medical history of Arthritis, Cataract, Diabetes mellitus without complication (HCC), Diabetic retinopathy (HCC), Dysrhythmia, Hyperlipidemia, Hypertension, Hypertensive retinopathy, Obesity, and Stroke (HCC).Joe Stewart   He presents with chief complaint of Medical Management of Chronic Issues (1 month follow up/labs, pt has no new concern) .   Discussed the use of AI scribe software for clinical note transcription with the patient, who gave verbal consent to proceed.  History of Present Illness Joe Stewart is a 68 year old male who presents for a follow-up to check lipids.  He is currently on atorvastatin  8 mg. During the last visit, his cholesterol was not checked because he was not fasting. He confirms that he  is fasting  today.  He has a history of diabetes and was started on Ozempic  0.25 mg weekly. He has experienced a weight loss of three pounds over the past month, from 265 to 262 pounds, initially losing five pounds but regaining two. He attributes some of the weight change to dietary adjustments, including increased fruit intake. No gastrointestinal side effects from Ozempic .  His A1c was previously noted to be elevated at 7. He is not currently taking B12 supplements after being informed of high levels previously. He has not had a PSA test in a couple of years.     ROS  Past Surgical History:  Procedure Laterality Date   BIOPSY  11/29/2019   Procedure: BIOPSY;  Surgeon: Brice Campi Albino Alu., MD;  Location: [facility];  Service: Gastroenterology;;   COLONOSCOPY WITH PROPOFOL  N/A 11/29/2019   Procedure: COLONOSCOPY WITH PROPOFOL ;  Surgeon: Brice Campi Albino Alu., MD;  Location: [facility];  Service: Gastroenterology;  Laterality: N/A;   COLONOSCOPY WITH PROPOFOL  N/A 05/13/2023   Procedure: COLONOSCOPY WITH PROPOFOL ;  Surgeon: Mansouraty, Albino Alu., MD;  Location: [facility];  Service: Gastroenterology;  Laterality: N/A;   ENDOSCOPIC MUCOSAL RESECTION N/A 11/29/2019   Procedure: ENDOSCOPIC MUCOSAL RESECTION;  Surgeon: Brice Campi Albino Alu., MD;  Location: [facility];  Service: Gastroenterology;  Laterality: N/A;   ESOPHAGOGASTRODUODENOSCOPY (EGD) WITH PROPOFOL  N/A 11/29/2019   Procedure: ESOPHAGOGASTRODUODENOSCOPY (EGD) WITH PROPOFOL ;  Surgeon: Brice Campi Albino Alu., MD;  Location: [facility];  Service: Gastroenterology;  Laterality: N/A;   EYE SURGERY Left    had to have eye flushed after getting costic sodium in L eye   HEMOSTASIS CLIP PLACEMENT  11/29/2019   Procedure: HEMOSTASIS CLIP PLACEMENT;  Surgeon: Normie Becton., MD;  Location: [facility];  Service: Gastroenterology;;   HERNIA REPAIR     HERNIA REPAIR     35 years ago   HOT HEMOSTASIS N/A 11/29/2019    Procedure: HOT HEMOSTASIS (ARGON PLASMA COAGULATION/BICAP);  Surgeon: Normie Becton., MD;  Location: [facility];  Service: Gastroenterology;  Laterality: N/A;   JOINT REPLACEMENT  2021   Left knee replacement   LEFT HEART CATH AND CORONARY ANGIOGRAPHY N/A 04/11/2019   Procedure: LEFT HEART CATH AND CORONARY ANGIOGRAPHY;  Surgeon: Knox Perl, MD;  Location: [facility];  Service: Cardiovascular;  Laterality: N/A;   POLYPECTOMY  11/29/2019   Procedure: POLYPECTOMY;  Surgeon: Brice Campi Albino Alu., MD;  Location: [facility];  Service: Gastroenterology;;   POLYPECTOMY  05/13/2023   Procedure: POLYPECTOMY, INTESTINE;  Surgeon: Normie Becton., MD;  Location: [facility];  Service: Gastroenterology;;   RENAL ANGIOGRAPHY Bilateral 04/11/2019   Procedure: RENAL ANGIOGRAPHY;  Surgeon: Knox Perl, MD;  Location: [facility];  Service: Cardiovascular;  Laterality: Bilateral;   SUBMUCOSAL LIFTING INJECTION  11/29/2019   Procedure: SUBMUCOSAL LIFTING INJECTION;  Surgeon: Normie Becton., MD;  Location: [facility];  Service: Gastroenterology;;   TOTAL KNEE ARTHROPLASTY Left 08/01/2019   Procedure: LEFT TOTAL KNEE ARTHROPLASTY;  Surgeon: Jasmine Mesi, MD;  Location: [facility] OR;  Service: Orthopedics;  Laterality: Left;    Outpatient Medications Prior to Visit  Medication Sig Dispense Refill   amLODipine  (NORVASC ) 5 MG tablet TAKE 1 TABLET (5 MG TOTAL) BY MOUTH DAILY. 90 tablet 3   atorvastatin  (LIPITOR ) 80 MG tablet TAKE 1 TABLET BY MOUTH EVERY DAY 90 tablet 1   blood glucose meter kit and supplies KIT Dispense based on patient and insurance preference. Use up to four times daily as directed. 1 each 0  ELIQUIS  5 MG TABS tablet TAKE 1 TABLET BY MOUTH TWICE A DAY 60 tablet 6   glipiZIDE  (GLUCOTROL ) 10 MG tablet Take 1 tablet (10 mg total) by mouth 2 (two) times daily before a meal. 180 tablet 3   hydrochlorothiazide  (HYDRODIURIL ) 25 MG tablet TAKE 1 TABLET (25  MG TOTAL) BY MOUTH DAILY. 90 tablet 3   JARDIANCE  25 MG TABS tablet TAKE 1 TABLET (25 MG TOTAL) BY MOUTH DAILY. 30 tablet 2   losartan  (COZAAR ) 100 MG tablet TAKE 1 TABLET BY MOUTH EVERY DAY 90 tablet 0   metFORMIN  (GLUCOPHAGE ) 500 MG tablet TAKE 1 TABLET BY MOUTH 2 TIMES DAILY WITH A MEAL. 180 tablet 3   metoprolol  succinate (TOPROL -XL) 50 MG 24 hr tablet TAKE 1 TABLET BY MOUTH DAILY. TAKE WITH OR IMMEDIATELY FOLLOWING A MEAL. 90 tablet 3   Semaglutide ,0.25 or 0.5MG /DOS, (OZEMPIC , 0.25 OR 0.5 MG/DOSE,) 2 MG/3ML SOPN INJECT 0.25MG  INTO THE SKIN ONE TIME PER WEEK 9 mL 0   No facility-administered medications prior to visit.    Family History  Problem Relation Age of Onset   Diabetes Mother    COPD Mother    Cancer Mother        unknown type    Heart disease Father    COPD Sister    Healthy Brother    Heart disease Brother    Healthy Son    Colon cancer Neg Hx    Colon polyps Neg Hx    Esophageal cancer Neg Hx    Rectal cancer Neg Hx    Stomach cancer Neg Hx    Inflammatory bowel disease Neg Hx    Liver disease Neg Hx    Pancreatic cancer Neg Hx     Social History   Socioeconomic History   Marital status: Single    Spouse name: Not on file   Number of children: 1   Years of education: Not on file   Highest education level: Some college, no degree  Occupational History   Not on file  Tobacco Use   Smoking status: Never   Smokeless tobacco: Never  Vaping Use   Vaping status: Never Used  Substance and Sexual Activity   Alcohol use: Not Currently   Drug use: Never   Sexual activity: Not Currently  Other Topics Concern   Not on file  Social History Narrative   Not on file   Social Drivers of Health   Financial Resource Strain: Low Risk  (06/07/2023)   Overall Financial Resource Strain (CARDIA)    Difficulty of Paying Living Expenses: Not hard at all  Food Insecurity: No Food Insecurity (06/07/2023)   Hunger Vital Sign    Worried About Running Out of Food in the Last  Year: Never true    Ran Out of Food in the Last Year: Never true  Transportation Needs: No Transportation Needs (06/07/2023)   PRAPARE - Administrator, Civil Service (Medical): No    Lack of Transportation (Non-Medical): No  Physical Activity: Insufficiently Active (06/07/2023)   Exercise Vital Sign    Days of Exercise per Week: 2 days    Minutes of Exercise per Session: 20 min  Stress: No Stress Concern Present (06/07/2023)   [employer] of Occupational Health - Occupational Stress Questionnaire    Feeling of Stress : Not at all  Social Connections: Moderately Isolated (06/07/2023)   Social Connection and Isolation Panel [NHANES]    Frequency of Communication with Friends and Family: More than three  times a week    Frequency of Social Gatherings with Friends and Family: Three times a week    Attends Religious Services: 1 to 4 times per year    Active Member of Clubs or Organizations: No    Attends Banker Meetings: Not on file    Marital Status: Divorced  Intimate Partner Violence: Not At Risk (05/28/2021)   Humiliation, Afraid, Rape, and Kick questionnaire    Fear of Current or Ex-Partner: No    Emotionally Abused: No    Physically Abused: No    Sexually Abused: No                                                                                                  Objective:  Physical Exam: BP 132/70   Pulse 76   Temp 98 F (36.7 C) (Temporal)   Ht 5\' 8"  (1.727 m)   Wt 262 lb (118.8 kg)   SpO2 96%   BMI 39.84 kg/m   Wt Readings from Last 3 Encounters:  07/08/23 262 lb (118.8 kg)  06/07/23 265 lb (120.2 kg)  05/13/23 264 lb 15.9 oz (120.2 kg)    Physical Exam MEASUREMENTS: Weight- 262. GENERAL: Alert, cooperative, well developed, no acute distress. HEENT: Normocephalic, normal oropharynx, moist mucous membranes. CHEST: Clear to auscultation bilaterally, no wheezes, rhonchi, or crackles. CARDIOVASCULAR: Normal heart rate and rhythm, S1 and S2  normal without murmurs. ABDOMEN: Soft, non-tender, non-distended, without organomegaly, normal bowel sounds. EXTREMITIES: No cyanosis or edema. NEUROLOGICAL: Cranial nerves grossly intact, moves all extremities without gross motor or sensory deficit.   Physical Exam  CUP PACEART REMOTE DEVICE CHECK Result Date: 06/15/2023 ILR summary report received. Battery status OK. Normal device function. No new symptom, tachy, brady, or pause episodes. No new AF episodes. Monthly summary reports and ROV/PRN LA, CVRS  CUP PACEART REMOTE DEVICE CHECK Result Date: 05/10/2023 ILR summary report received. Battery status OK. Normal device function. No new symptom, tachy, brady, or pause episodes. No new AF episodes. Monthly summary reports and ROV/PRN ML, CVRS   Recent Results (from the past 2160 hours)  CUP PACEART REMOTE DEVICE CHECK     Status: None   Collection Time: 05/09/23 11:10 PM  Result Value Ref Range   Date Time Interrogation Session [id number]    Pulse Generator Manufacturer MERM    Pulse Gen Model LNQ22 LINQ II    Pulse Gen Serial Number [id number]    Clinic Name [facility]    Implantable Pulse Generator Type ICM/ILR    Implantable Pulse Generator Implant Date [id number]    Eval Rhythm SR at 70 bpm   Glucose, capillary     Status: Abnormal   Collection Time: 05/13/23  8:31 AM  Result Value Ref Range   Glucose-Capillary 139 (H) 70 - 99 mg/dL    Comment: Glucose reference range applies only to samples taken after fasting for at least 8 hours.  Surgical pathology     Status: None   Collection Time: 05/13/23  9:42 AM  Result Value Ref Range   SURGICAL PATHOLOGY      SURGICAL  PATHOLOGY CASE: [id number] PATIENT: Tareek Lave Surgical Pathology Report     Clinical History: Cecal polyps, abnormal LFTs (las)     FINAL MICROSCOPIC DIAGNOSIS:  A. COLON, POLYPECTOMY: Tubular adenoma (2) without high grade dysplasia.   GROSS DESCRIPTION:  Received in formalin are 2  tan-pink polypoid tissues, 0.5 and 1.4 cm in length, and range from 0.2 to 0.3 cm thick.  In toto, one block.  SW 05/13/2023   Final Diagnosis performed by Ramey [place], MD.   Electronically signed 05/14/2023 Technical component performed at [facility], [address] [place], [place] [address].  Professional component performed at [employer], [address], [place], [place] [address].  Immunohistochemistry Technical component (if applicable) was performed at [facility]. [address], [address], [place], [place] [address].   IMMUNOHISTOCHEMISTRY DISCLAIMER (if applicable): Some of these immunohisto chemical stains may have been developed and the performance characteristics determine by [facility]. Some may not have been cleared or approved by the U.S. Food and Drug Administration. The FDA has determined that such clearance or approval is not necessary. This test is used for clinical purposes. It should not be regarded as investigational or for research. This laboratory is certified under the Clinical Laboratory Improvement Amendments of 1988 (CLIA-88) as qualified to perform high complexity clinical laboratory testing.  The controls stained appropriately.   IHC stains are performed on formalin fixed, paraffin embedded tissue using a 3,3"diaminobenzidine (DAB) chromogen and Leica Bond Autostainer System. The staining intensity of the nucleus is score manually and is reported as the percentage of tumor cell nuclei demonstrating specific nuclear staining. The specimens are fixed in 10% Neutral Formalin for at least 6 hours and up to 72hrs. These tests are  validated on decalcified tissue. Results should be interpreted with caution given the possibility of false negative results on decalcified specimens. Antibody Clones are as follows ER-clone 2F, PR-clone 16, Ki67- clone MM1. Some of these immunohistochemical  stains may have been developed and the performance characteristics determined by [facility].   POCT glycosylated hemoglobin (Hb A1C)     Status: Abnormal   Collection Time: 06/07/23  4:13 PM  Result Value Ref Range   Hemoglobin A1C 7.8 (A) 4.0 - 5.6 %   HbA1c POC (<> result, manual entry)     HbA1c, POC (prediabetic range)     HbA1c, POC (controlled diabetic range)    CUP PACEART REMOTE DEVICE CHECK     Status: None   Collection Time: 06/13/23 11:40 PM  Result Value Ref Range   Date Time Interrogation Session [phone]    Pulse Generator Manufacturer MERM    Pulse Gen Model LNQ22 LINQ II    Pulse Gen Serial Number [id number]    Clinic Name [facility]    Implantable Pulse Generator Type ICM/ILR    Implantable Pulse Generator Implant Date [id number]         Carnell Christian, MD, MS

## 2023-07-08 NOTE — Patient Instructions (Signed)
  VISIT SUMMARY: Today, you came in for a follow-up visit to check your cholesterol levels and discuss your diabetes management. You have been fasting for your cholesterol test, and we reviewed your current medications and recent health changes.  YOUR PLAN: -TYPE 2 DIABETES MELLITUS: Type 2 diabetes is a condition where your body does not use insulin  properly, leading to high blood sugar levels. We will increase your Ozempic  dose to 0.5 mg weekly to help better control your blood sugar and support weight loss. Be aware of potential mild nausea, and try eating small meals to manage this. Side effects usually improve over time.  -HYPERLIPIDEMIA: Hyperlipidemia means having high levels of fats in your blood, which can increase your risk of heart disease. We will check your fasting lipid panel today to monitor your cholesterol levels.  -CHRONIC KIDNEY DISEASE MONITORING: Chronic kidney disease is a condition where your kidneys are damaged and can't filter blood as well as they should. We will test your urine for protein to monitor your kidney function.  -VITAMIN B12 MONITORING: Vitamin B12 is important for nerve function and blood cell production. We will recheck your B12 levels since you previously had high levels and stopped supplementation.  -PROSTATE CANCER SCREENING: Prostate cancer screening helps detect prostate cancer early. We will add a PSA test to your lab work since you haven't had one in a couple of years.  INSTRUCTIONS: Please schedule a follow-up visit in two months to assess your response to the increased Ozempic  dose and review your lab results.

## 2023-07-09 LAB — URINALYSIS W MICROSCOPIC + REFLEX CULTURE
Bacteria, UA: NONE SEEN /HPF
Bilirubin Urine: NEGATIVE
Hgb urine dipstick: NEGATIVE
Ketones, ur: NEGATIVE
Leukocyte Esterase: NEGATIVE
Nitrites, Initial: NEGATIVE
RBC / HPF: NONE SEEN /HPF (ref 0–2)
Specific Gravity, Urine: 1.026 (ref 1.001–1.035)
Squamous Epithelial / HPF: NONE SEEN /HPF (ref <=5)
WBC, UA: NONE SEEN /HPF (ref 0–5)
pH: 5.5 (ref 5.0–8.0)

## 2023-07-09 LAB — NO CULTURE INDICATED

## 2023-07-10 ENCOUNTER — Other Ambulatory Visit: Payer: Self-pay | Admitting: Family Medicine

## 2023-07-12 ENCOUNTER — Ambulatory Visit: Admitting: Podiatry

## 2023-07-12 ENCOUNTER — Ambulatory Visit: Payer: Medicare Other

## 2023-07-15 ENCOUNTER — Ambulatory Visit

## 2023-07-15 DIAGNOSIS — I4891 Unspecified atrial fibrillation: Secondary | ICD-10-CM | POA: Diagnosis not present

## 2023-07-15 LAB — CUP PACEART REMOTE DEVICE CHECK
Date Time Interrogation Session: 20250611232108
Implantable Pulse Generator Implant Date: 20230407

## 2023-07-18 ENCOUNTER — Ambulatory Visit: Payer: Self-pay | Admitting: Internal Medicine

## 2023-07-19 ENCOUNTER — Ambulatory Visit: Payer: Medicare Other

## 2023-07-27 ENCOUNTER — Encounter: Payer: Self-pay | Admitting: Family Medicine

## 2023-07-27 DIAGNOSIS — E113411 Type 2 diabetes mellitus with severe nonproliferative diabetic retinopathy with macular edema, right eye: Secondary | ICD-10-CM | POA: Diagnosis not present

## 2023-07-27 DIAGNOSIS — E113412 Type 2 diabetes mellitus with severe nonproliferative diabetic retinopathy with macular edema, left eye: Secondary | ICD-10-CM | POA: Diagnosis not present

## 2023-07-27 DIAGNOSIS — H43823 Vitreomacular adhesion, bilateral: Secondary | ICD-10-CM | POA: Diagnosis not present

## 2023-07-27 DIAGNOSIS — Z794 Long term (current) use of insulin: Secondary | ICD-10-CM | POA: Diagnosis not present

## 2023-07-27 LAB — HM DIABETES EYE EXAM

## 2023-08-02 NOTE — Progress Notes (Signed)
 [facility] Summary Report / Loop Recorder

## 2023-08-02 NOTE — Addendum Note (Signed)
 Addended by: TAWNI DRILLING D on: 08/02/2023 03:22 PM   Modules accepted: Orders

## 2023-08-10 DIAGNOSIS — E113412 Type 2 diabetes mellitus with severe nonproliferative diabetic retinopathy with macular edema, left eye: Secondary | ICD-10-CM | POA: Diagnosis not present

## 2023-08-10 DIAGNOSIS — E113411 Type 2 diabetes mellitus with severe nonproliferative diabetic retinopathy with macular edema, right eye: Secondary | ICD-10-CM | POA: Diagnosis not present

## 2023-08-10 DIAGNOSIS — H43823 Vitreomacular adhesion, bilateral: Secondary | ICD-10-CM | POA: Diagnosis not present

## 2023-08-16 ENCOUNTER — Ambulatory Visit (INDEPENDENT_AMBULATORY_CARE_PROVIDER_SITE_OTHER)

## 2023-08-16 ENCOUNTER — Ambulatory Visit: Payer: Medicare Other

## 2023-08-16 ENCOUNTER — Ambulatory Visit: Payer: Self-pay | Admitting: Internal Medicine

## 2023-08-16 DIAGNOSIS — I4891 Unspecified atrial fibrillation: Secondary | ICD-10-CM | POA: Diagnosis not present

## 2023-08-16 LAB — CUP PACEART REMOTE DEVICE CHECK
Date Time Interrogation Session: 20250713234044
Implantable Pulse Generator Implant Date: 20230407

## 2023-08-17 ENCOUNTER — Other Ambulatory Visit: Payer: Self-pay | Admitting: Family Medicine

## 2023-08-17 DIAGNOSIS — E119 Type 2 diabetes mellitus without complications: Secondary | ICD-10-CM

## 2023-08-17 DIAGNOSIS — I10 Essential (primary) hypertension: Secondary | ICD-10-CM

## 2023-08-23 ENCOUNTER — Ambulatory Visit: Payer: Self-pay

## 2023-08-23 ENCOUNTER — Ambulatory Visit: Payer: Medicare Other

## 2023-08-23 NOTE — Telephone Encounter (Signed)
 FYI Only or Action Required?: FYI only for provider.  Patient was last seen in primary care on 07/08/2023 by Sebastian Beverley NOVAK, MD.  Called Nurse Triage reporting right rib pain.  Symptoms began a week ago.  Interventions attempted: Nothing.  Symptoms are: right sided rib/axilla pain (last seconds) intermittent unchanged.  Triage Disposition: See Physician Within 24 Hours  Patient/caregiver understands and will follow disposition?: Yes             Copied from CRM [id number]. Topic: Clinical - Red Word Triage >> Aug 23, 2023  9:16 AM Henretta I wrote: Red Word that prompted transfer to Nurse Triage: Sharp Pain in right side right above ribs, closer to front and armpit been going on for week. Reason for Disposition  [1] Chest pain lasts < 5 minutes AND [2] NO chest pain or cardiac symptoms (e.g., breathing difficulty, sweating) now  (Exception: Chest pains that last only a few seconds.)  Answer Assessment - Initial Assessment Questions 1. LOCATION: Where does it hurt?       Right upper ribs under right armpit.  2. RADIATION: Does the pain go anywhere else? (e.g., into neck, jaw, arms, back)     He states if he rubs his hand over his ribs he feels the pain.  3. ONSET: When did the chest pain begin? (Minutes, hours or days)      X 1 week.  4. PATTERN: Does the pain come and go, or has it been constant since it started?  Does it get worse with exertion?      Comes and goes. Not worse with exertion.  5. DURATION: How long does it last (e.g., seconds, minutes, hours)     Just seconds.  6. SEVERITY: How bad is the pain?  (e.g., Scale 1-10; mild, moderate, or severe)     None right now.  7. CARDIAC RISK FACTORS: Do you have any history of heart problems or risk factors for heart disease? (e.g., angina, prior heart attack; diabetes, high blood pressure, high cholesterol, smoker, or strong family history of heart disease)     Diabetes, high blood pressure, high  cholesterol.  8. PULMONARY RISK FACTORS: Do you have any history of lung disease?  (e.g., blood clots in lung, asthma, emphysema, birth control pills)     No.  9. CAUSE: What do you think is causing the chest pain?     He states he thought it was gas built up as a side effect from medications. He states he also thought it could be his gallbladder. Patient denies any falls or exercise/over use of muscles.  10. OTHER SYMPTOMS: Do you have any other symptoms? (e.g., dizziness, nausea, vomiting, sweating, fever, difficulty breathing, cough)       Patient denies chest pain or pain radiating to jaw/left arm, SOB/difficulty breathing, nausea, vomiting, sweating, fever . Patient states sometimes when he stands up he feels light headed (intermittent and resolves).  11. PREGNANCY: Is there any chance you are pregnant? When was your last menstrual period?       N/A.  Protocols used: Chest Pain-A-AH

## 2023-08-24 ENCOUNTER — Ambulatory Visit (INDEPENDENT_AMBULATORY_CARE_PROVIDER_SITE_OTHER): Admitting: Family Medicine

## 2023-08-24 VITALS — BP 136/80 | HR 90 | Temp 98.2°F | Ht 68.0 in | Wt 264.2 lb

## 2023-08-24 DIAGNOSIS — R0789 Other chest pain: Secondary | ICD-10-CM | POA: Diagnosis not present

## 2023-08-24 NOTE — Patient Instructions (Signed)
  VISIT SUMMARY: Today, you came in with right-sided chest pain that has been bothering you for about a week. The pain has been decreasing in intensity and is likely due to a musculoskeletal issue, but we need to rule out other potential causes.  YOUR PLAN: -RIGHT-SIDED CHEST PAIN: Your chest pain is likely due to a muscle strain or sprain, but we need to rule out other potential causes such as issues with your gallbladder, liver, pancreas, or kidneys. We will do this by running several tests, including blood work, urinalysis, x-rays, and an abdominal ultrasound. In the meantime, you can take acetaminophen  if you experience any discomfort. Please keep an eye out for any new symptoms like nausea or vomiting and let us  know if they occur.  INSTRUCTIONS: We have ordered a complete metabolic panel (CMP), C-reactive protein (CRP), lipase, and amylase to check your liver, pancreatic, and gallbladder function. You will also have a urinalysis with reflex microscopy to evaluate for kidney stones, as well as chest and abdominal x-rays and an abdominal ultrasound to look for any other potential causes of your pain. Please follow up with us  once these tests are completed.

## 2023-08-24 NOTE — Progress Notes (Signed)
 Assessment & Plan   Assessment/Plan:    Assessment & Plan Right-sided chest pain Intermittent right-sided chest pain for one week, with tenderness on palpation of the right lower thoracic region. Pain is decreasing in intensity and is likely musculoskeletal, possibly due to a strain or sprain. Differential diagnosis includes musculoskeletal pain, gallbladder issues, liver or pancreatic dysfunction, and renal stones. No associated symptoms such as fever, chills, urinary symptoms, or respiratory distress. Further investigation is warranted to rule out other causes given the anatomical location of the pain. - Order complete metabolic panel (CMP), C-reactive protein (CRP), lipase, and amylase to assess liver, pancreatic, and gallbladder function. - Order urinalysis with reflex microscopy to evaluate for renal stones. - Order chest and abdominal x-rays to assess for skeletal abnormalities and other potential causes of pain. - Order abdominal ultrasound to evaluate liver and gallbladder. - Advise use of acetaminophen  for discomfort if needed. - Instruct him to monitor for any new symptoms such as nausea or vomiting.      There are no discontinued medications.  Return if symptoms worsen or fail to improve.        Subjective:   Encounter date: 08/24/2023  Joe Stewart is a 68 y.o. male who has Hypertension associated with diabetes (HCC); Abnormal nuclear stress test; Type 2 diabetes mellitus with stage 3a chronic kidney disease, without long-term current use of insulin  (HCC); Arthritis of left knee; Cecal polyp; History of colon polyps; Abnormal colonoscopy; Anemia; Hyperlipidemia associated with type 2 diabetes mellitus (HCC); Snores; History of ischemic stroke; Familial hypercholesterolemia; Coronary artery disease involving native coronary artery of native heart without angina pectoris; Class 2 severe obesity with serious comorbidity in adult [facility]); Epistaxis; Paroxysmal atrial  fibrillation (HCC); Hypercoagulable state due to paroxysmal atrial fibrillation (HCC); LVH (left ventricular hypertrophy); Dizzy spells; Abnormal LFTs; Chronic anticoagulation; Hx of adenomatous colonic polyps; Gambling and betting; Moderate nonproliferative diabetic retinopathy of both eyes with macular edema associated with type 2 diabetes mellitus (HCC); and 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   He  has a past medical history of Arthritis, Cataract, Diabetes mellitus without complication (HCC), Diabetic retinopathy (HCC), Dysrhythmia, Hyperlipidemia, Hypertension, Hypertensive retinopathy, Obesity, and Stroke (HCC).SABRA   He presents with chief complaint of Acute Visit (Right rib pain) .   Discussed the use of AI scribe software for clinical note transcription with the patient, who gave verbal consent to proceed.  History of Present Illness Joe Stewart is a 68 year old male who presents with right-sided chest pain.  He has been experiencing right-sided chest pain for approximately six to seven days. The pain is tender, located in the right lower chest area, and sometimes radiates to the back. It began [place], and he considered postponing the trip due to the severity of the pain at that time.  The pain has been intermittent, initially occurring every fifteen to twenty minutes, but has decreased in intensity over the past few days. It was sharper and more frequent before the casino trip but has since lessened. He describes the pain as 'sharp enough' to consider mentioning it, but it was not constant.  He denies any urinary symptoms, fever, chills, trouble breathing, or pain associated with eating. No nausea or vomiting. He has not taken any medication for the discomfort and rates the pain as non-existent at the time of the visit, although it was significant enough previously to cause concern.  He mentioned being  worried  about whether his medications could be related to the pain, but he is unsure if this is the case. He does not recall any specific incident, such as lifting or moving something, that could have triggered the pain.     ROS  Past Surgical History:  Procedure Laterality Date   BIOPSY  11/29/2019   Procedure: BIOPSY;  Surgeon: Wilhelmenia Aloha Raddle., MD;  Location: [facility];  Service: Gastroenterology;;   COLONOSCOPY WITH PROPOFOL  N/A 11/29/2019   Procedure: COLONOSCOPY WITH PROPOFOL ;  Surgeon: Wilhelmenia Aloha Raddle., MD;  Location: [facility];  Service: Gastroenterology;  Laterality: N/A;   COLONOSCOPY WITH PROPOFOL  N/A 05/13/2023   Procedure: COLONOSCOPY WITH PROPOFOL ;  Surgeon: Mansouraty, Aloha Raddle., MD;  Location: [facility];  Service: Gastroenterology;  Laterality: N/A;   ENDOSCOPIC MUCOSAL RESECTION N/A 11/29/2019   Procedure: ENDOSCOPIC MUCOSAL RESECTION;  Surgeon: Wilhelmenia Aloha Raddle., MD;  Location: [facility];  Service: Gastroenterology;  Laterality: N/A;   ESOPHAGOGASTRODUODENOSCOPY (EGD) WITH PROPOFOL  N/A 11/29/2019   Procedure: ESOPHAGOGASTRODUODENOSCOPY (EGD) WITH PROPOFOL ;  Surgeon: Wilhelmenia Aloha Raddle., MD;  Location: [facility];  Service: Gastroenterology;  Laterality: N/A;   EYE SURGERY Left    had to have eye flushed after getting costic sodium in L eye   HEMOSTASIS CLIP PLACEMENT  11/29/2019   Procedure: HEMOSTASIS CLIP PLACEMENT;  Surgeon: Wilhelmenia Aloha Raddle., MD;  Location: [facility];  Service: Gastroenterology;;   HERNIA REPAIR     HERNIA REPAIR     35 years ago   HOT HEMOSTASIS N/A 11/29/2019   Procedure: HOT HEMOSTASIS (ARGON PLASMA COAGULATION/BICAP);  Surgeon: Wilhelmenia Aloha Raddle., MD;  Location: [facility];  Service: Gastroenterology;  Laterality: N/A;   JOINT REPLACEMENT  2021   Left knee replacement   LEFT HEART CATH AND CORONARY ANGIOGRAPHY N/A 04/11/2019   Procedure: LEFT HEART CATH AND CORONARY ANGIOGRAPHY;  Surgeon:  Ladona Heinz, MD;  Location: [facility];  Service: Cardiovascular;  Laterality: N/A;   POLYPECTOMY  11/29/2019   Procedure: POLYPECTOMY;  Surgeon: Wilhelmenia Aloha Raddle., MD;  Location: [facility];  Service: Gastroenterology;;   POLYPECTOMY  05/13/2023   Procedure: POLYPECTOMY, INTESTINE;  Surgeon: Wilhelmenia Aloha Raddle., MD;  Location: [facility];  Service: Gastroenterology;;   RENAL ANGIOGRAPHY Bilateral 04/11/2019   Procedure: RENAL ANGIOGRAPHY;  Surgeon: Ladona Heinz, MD;  Location: [facility];  Service: Cardiovascular;  Laterality: Bilateral;   SUBMUCOSAL LIFTING INJECTION  11/29/2019   Procedure: SUBMUCOSAL LIFTING INJECTION;  Surgeon: Wilhelmenia Aloha Raddle., MD;  Location: [facility];  Service: Gastroenterology;;   TOTAL KNEE ARTHROPLASTY Left 08/01/2019   Procedure: LEFT TOTAL KNEE ARTHROPLASTY;  Surgeon: Addie Cordella Hamilton, MD;  [facility];  Service: Orthopedics;  Laterality: Left;    Outpatient Medications Prior to Visit  Medication Sig Dispense Refill   amLODipine  (NORVASC ) 5 MG tablet TAKE 1 TABLET (5 MG TOTAL) BY MOUTH DAILY. 90 tablet 3   atorvastatin  (LIPITOR ) 80 MG tablet TAKE 1 TABLET BY MOUTH EVERY DAY 90 tablet 1   blood glucose meter kit and supplies KIT Dispense based on patient and insurance preference. Use up to four times daily as directed. 1 each 0   ELIQUIS  5 MG TABS tablet TAKE 1 TABLET BY MOUTH TWICE A DAY 60 tablet 6   glipiZIDE  (GLUCOTROL ) 10 MG tablet Take 1 tablet (10 mg total) by mouth 2 (two) times daily before a meal. 180 tablet 3   hydrochlorothiazide  (HYDRODIURIL ) 25 MG tablet TAKE 1 TABLET (25 MG TOTAL) BY MOUTH DAILY. 90 tablet 3  JARDIANCE  25 MG TABS tablet TAKE 1 TABLET (25 MG TOTAL) BY MOUTH DAILY. 30 tablet 2   losartan  (COZAAR ) 100 MG tablet TAKE 1 TABLET BY MOUTH EVERY DAY 90 tablet 0   metFORMIN  (GLUCOPHAGE ) 500 MG tablet TAKE 1 TABLET BY MOUTH 2 TIMES DAILY WITH A MEAL. 180 tablet 3   metoprolol  succinate (TOPROL -XL) 50  MG 24 hr tablet TAKE 1 TABLET BY MOUTH DAILY. TAKE WITH OR IMMEDIATELY FOLLOWING A MEAL. 90 tablet 3   Semaglutide ,0.25 or 0.5MG /DOS, (OZEMPIC , 0.25 OR 0.5 MG/DOSE,) 2 MG/3ML SOPN Inject 0.5 mg into the skin once a week. 6 mL 0   No facility-administered medications prior to visit.    Family History  Problem Relation Age of Onset   Diabetes Mother    COPD Mother    Cancer Mother        unknown type    Heart disease Father    COPD Sister    Healthy Brother    Heart disease Brother    Healthy Son    Colon cancer Neg Hx    Colon polyps Neg Hx    Esophageal cancer Neg Hx    Rectal cancer Neg Hx    Stomach cancer Neg Hx    Inflammatory bowel disease Neg Hx    Liver disease Neg Hx    Pancreatic cancer Neg Hx     Social History   Socioeconomic History   Marital status: Single    Spouse name: Not on file   Number of children: 1   Years of education: Not on file   Highest education level: Some college, no degree  Occupational History   Not on file  Tobacco Use   Smoking status: Never   Smokeless tobacco: Never  Vaping Use   Vaping status: Never Used  Substance and Sexual Activity   Alcohol use: Not Currently   Drug use: Never   Sexual activity: Not Currently  Other Topics Concern   Not on file  Social History Narrative   Not on file   Social Drivers of Health   Financial Resource Strain: Low Risk  (08/23/2023)   Overall Financial Resource Strain (CARDIA)    Difficulty of Paying Living Expenses: Not hard at all  Food Insecurity: No Food Insecurity (08/23/2023)   Hunger Vital Sign    Worried About Running Out of Food in the Last Year: Never true    Ran Out of Food in the Last Year: Never true  Transportation Needs: No Transportation Needs (08/23/2023)   PRAPARE - Administrator, Civil Service (Medical): No    Lack of Transportation (Non-Medical): No  Physical Activity: Insufficiently Active (08/23/2023)   Exercise Vital Sign    Days of Exercise per Week:  4 days    Minutes of Exercise per Session: 30 min  Stress: No Stress Concern Present (08/23/2023)   [employer] of Occupational Health - Occupational Stress Questionnaire    Feeling of Stress: Not at all  Social Connections: Moderately Isolated (08/23/2023)   Social Connection and Isolation Panel    Frequency of Communication with Friends and Family: More than three times a week    Frequency of Social Gatherings with Friends and Family: More than three times a week    Attends Religious Services: More than 4 times per year    Active Member of [employer] or Organizations: No    Attends Banker Meetings: Not on file    Marital Status: Divorced  Intimate Partner Violence: Not  At Risk (05/28/2021)   Humiliation, Afraid, Rape, and Kick questionnaire    Fear of Current or Ex-Partner: No    Emotionally Abused: No    Physically Abused: No    Sexually Abused: No                                                                                                  Objective:  Physical Exam: BP 136/80 (BP Location: Left Arm, Patient Position: Sitting, Cuff Size: Normal)   Pulse 90   Temp 98.2 F (36.8 C)   Ht 5' 8 (1.727 m)   Wt 264 lb 3.2 oz (119.8 kg)   SpO2 99%   BMI 40.17 kg/m    Physical Exam GENERAL: Alert, cooperative, well developed, no acute distress. HEENT: Normocephalic, normal oropharynx, moist mucous membranes, anicteric  CHEST: Clear to auscultation bilaterally, no wheezes, rhonchi, or crackles. Tenderness on palpation of right lower chest wall. No bruising or deformity on right lower chest wall. No pain with inspiration. CARDIOVASCULAR: Normal heart rate and rhythm, S1 and S2 normal without murmurs. ABDOMEN: Soft, non-tender, non-distended, without organomegaly, normal bowel sounds. EXTREMITIES: No cyanosis or edema. SKIN: No jaudice NEUROLOGICAL: Cranial nerves grossly intact, moves all extremities without gross motor or sensory deficit.   Physical Exam  CUP  PACEART REMOTE DEVICE CHECK Result Date: 08/16/2023 ILR summary report received. Battery status OK. Normal device function. No new symptom, tachy, brady, or pause episodes. No new AF episodes. Monthly summary reports and ROV/PRN - CS, CVRS  CUP PACEART REMOTE DEVICE CHECK Result Date: 07/15/2023 ILR summary report received. Battery status OK. Normal device function. No new symptom, tachy, brady, or pause episodes. No new AF episodes. Monthly summary reports and ROV/PRN AB, CVRS  CUP PACEART REMOTE DEVICE CHECK Result Date: 06/15/2023 ILR summary report received. Battery status OK. Normal device function. No new symptom, tachy, brady, or pause episodes. No new AF episodes. Monthly summary reports and ROV/PRN LA, CVRS   Recent Results (from the past 2160 hours)  POCT glycosylated hemoglobin (Hb A1C)     Status: Abnormal   Collection Time: 06/07/23  4:13 PM  Result Value Ref Range   Hemoglobin A1C 7.8 (A) 4.0 - 5.6 %   HbA1c POC (<> result, manual entry)     HbA1c, POC (prediabetic range)     HbA1c, POC (controlled diabetic range)    CUP PACEART REMOTE DEVICE CHECK     Status: None   Collection Time: 06/13/23 11:40 PM  Result Value Ref Range   Date Time Interrogation Session [phone]    Pulse Generator Manufacturer MERM    Pulse Gen Model LNQ22 LINQ II    Pulse Gen Serial Number [id number]    Clinic [facility]    Implantable Pulse Generator Type ICM/ILR    Implantable Pulse Generator Implant Date [id number]   Lipid panel     Status: Abnormal   Collection Time: 07/08/23  8:52 AM  Result Value Ref Range   Cholesterol 98 0 - 200 mg/dL    Comment: ATP III Classification       Desirable:  <  200 mg/dL               Borderline High:  200 - 239 mg/dL          High:  > = 759 mg/dL   Triglycerides 889.9 0.0 - 149.0 mg/dL    Comment: Normal:  <849 mg/dLBorderline High:  150 - 199 mg/dL   HDL 71.69 (L) >60.99 mg/dL   VLDL 77.9 0.0 - 59.9 mg/dL   LDL Cholesterol 48 0 - 99 mg/dL    Total CHOL/HDL Ratio 3     Comment:                Men          Women1/2 Average Risk     3.4          3.3Average Risk          5.0          4.42X Average Risk          9.6          7.13X Average Risk          15.0          11.0                       NonHDL 69.59     Comment: NOTE:  Non-HDL goal should be 30 mg/dL higher than patient's LDL goal (i.e. LDL goal of < 70 mg/dL, would have non-HDL goal of < 100 mg/dL)  A87     Status: None   Collection Time: 07/08/23  8:52 AM  Result Value Ref Range   Vitamin B-12 435 211 - 911 pg/mL  PSA     Status: Abnormal   Collection Time: 07/08/23  8:52 AM  Result Value Ref Range   PSA 8.01 (H) 0.10 - 4.00 ng/mL    Comment: Test performed using Access Hybritech PSA Assay, a parmagnetic partical, chemiluminecent immunoassay.  Microalbumin / creatinine urine ratio     Status: Abnormal   Collection Time: 07/08/23  8:56 AM  Result Value Ref Range   Microalb, Ur 11.8 (H) 0.0 - 1.9 mg/dL   Creatinine,U 883.9 mg/dL   Microalb Creat Ratio 101.8 (H) 0.0 - 30.0 mg/g  Urinalysis w microscopic + reflex cultur     Status: Abnormal   Collection Time: 07/08/23  8:56 AM   Specimen: Urine  Result Value Ref Range   Color, Urine YELLOW YELLOW   APPearance CLEAR CLEAR   Specific Gravity, Urine 1.026 1.001 - 1.035   pH 5.5 5.0 - 8.0   Glucose, UA 3+ (A) NEGATIVE   Bilirubin Urine NEGATIVE NEGATIVE   Ketones, ur NEGATIVE NEGATIVE   Hgb urine dipstick NEGATIVE NEGATIVE   Protein, ur TRACE (A) NEGATIVE   Nitrites, Initial NEGATIVE NEGATIVE   Leukocyte Esterase NEGATIVE NEGATIVE   WBC, UA NONE SEEN 0 - 5 /HPF   RBC / HPF NONE SEEN 0 - 2 /HPF   Squamous Epithelial / HPF NONE SEEN < OR = 5 /HPF   Bacteria, UA NONE SEEN NONE SEEN /HPF   Hyaline Cast 0-5 (A) NONE SEEN /LPF   Note      Comment: This urine was analyzed for the presence of WBC,  RBC, bacteria, casts, and other formed elements.  Only those elements seen were reported. . .   REFLEXIVE URINE CULTURE      Status: None   Collection Time: 07/08/23  8:56 AM  Result Value  Ref Range   Reflexve Urine Culture      Comment: NO CULTURE INDICATED  CUP PACEART REMOTE DEVICE CHECK     Status: None   Collection Time: 07/14/23 11:21 PM  Result Value Ref Range   Date Time Interrogation Session [id number]    Pulse Generator Manufacturer MERM    Pulse Gen Model LNQ22 LINQ II    Pulse Gen Serial Number [id number]    Clinic Name [facility]    Implantable Pulse Generator Type ICM/ILR    Implantable Pulse Generator Implant Date [id number]   HM DIABETES EYE EXAM     Status: Abnormal   Collection Time: 07/27/23 11:10 AM  Result Value Ref Range   HM Diabetic Eye Exam Retinopathy (A) No Retinopathy    Comment: abstracted by him  CUP PACEART REMOTE DEVICE CHECK     Status: None   Collection Time: 08/15/23 11:40 PM  Result Value Ref Range   Date Time Interrogation Session [id number]    Pulse Generator Manufacturer MERM    Pulse Gen Model LNQ22 LINQ II    Pulse Gen Serial Number [id number]    Clinic Name [facility]    Implantable Pulse Generator Type ICM/ILR    Implantable Pulse Generator Implant Date [id number]         Beverley Adine Hummer, MD, MS

## 2023-08-25 ENCOUNTER — Ambulatory Visit: Payer: Self-pay | Admitting: Family Medicine

## 2023-08-25 DIAGNOSIS — R932 Abnormal findings on diagnostic imaging of liver and biliary tract: Secondary | ICD-10-CM

## 2023-08-25 DIAGNOSIS — K760 Fatty (change of) liver, not elsewhere classified: Secondary | ICD-10-CM

## 2023-08-25 DIAGNOSIS — R161 Splenomegaly, not elsewhere classified: Secondary | ICD-10-CM

## 2023-08-25 LAB — URINALYSIS W MICROSCOPIC + REFLEX CULTURE
Bacteria, UA: NONE SEEN /HPF
Bilirubin Urine: NEGATIVE
Hgb urine dipstick: NEGATIVE
Hyaline Cast: NONE SEEN /LPF
Ketones, ur: NEGATIVE
Leukocyte Esterase: NEGATIVE
Nitrites, Initial: NEGATIVE
RBC / HPF: NONE SEEN /HPF (ref 0–2)
Specific Gravity, Urine: 1.026 (ref 1.001–1.035)
Squamous Epithelial / HPF: NONE SEEN /HPF (ref <=5)
WBC, UA: NONE SEEN /HPF (ref 0–5)
pH: 5.5 (ref 5.0–8.0)

## 2023-08-25 LAB — COMPREHENSIVE METABOLIC PANEL WITH GFR
ALT: 27 U/L (ref 0–53)
AST: 34 U/L (ref 0–37)
Albumin: 4.6 g/dL (ref 3.5–5.2)
Alkaline Phosphatase: 76 U/L (ref 39–117)
BUN: 24 mg/dL — ABNORMAL HIGH (ref 6–23)
CO2: 26 meq/L (ref 19–32)
Calcium: 9.4 mg/dL (ref 8.4–10.5)
Chloride: 104 meq/L (ref 96–112)
Creatinine, Ser: 1.24 mg/dL (ref 0.40–1.50)
GFR: 59.14 mL/min — ABNORMAL LOW (ref >60.00)
Glucose, Bld: 56 mg/dL — ABNORMAL LOW (ref 70–99)
Potassium: 4.3 meq/L (ref 3.5–5.1)
Sodium: 141 meq/L (ref 135–145)
Total Bilirubin: 0.6 mg/dL (ref 0.2–1.2)
Total Protein: 8.5 g/dL — ABNORMAL HIGH (ref 6.0–8.3)

## 2023-08-25 LAB — CBC WITH DIFFERENTIAL/PLATELET
Basophils Absolute: 0.1 K/uL (ref 0.0–0.1)
Basophils Relative: 1 % (ref 0.0–3.0)
Eosinophils Absolute: 0.3 K/uL (ref 0.0–0.7)
Eosinophils Relative: 3.1 % (ref 0.0–5.0)
HCT: 39.9 % (ref 39.0–52.0)
Hemoglobin: 13.3 g/dL (ref 13.0–17.0)
Lymphocytes Relative: 39.9 % (ref 12.0–46.0)
Lymphs Abs: 3.7 K/uL (ref 0.7–4.0)
MCHC: 33.2 g/dL (ref 30.0–36.0)
MCV: 93.1 fl (ref 78.0–100.0)
Monocytes Absolute: 0.6 K/uL (ref 0.1–1.0)
Monocytes Relative: 6.9 % (ref 3.0–12.0)
Neutro Abs: 4.5 K/uL (ref 1.4–7.7)
Neutrophils Relative %: 49.1 % (ref 43.0–77.0)
Platelets: 228 K/uL (ref 150.0–400.0)
RBC: 4.29 Mil/uL (ref 4.22–5.81)
RDW: 14.8 % (ref 11.5–15.5)
WBC: 9.2 K/uL (ref 4.0–10.5)

## 2023-08-25 LAB — LIPASE: Lipase: 78 U/L — ABNORMAL HIGH (ref 11.0–59.0)

## 2023-08-25 LAB — AMYLASE: Amylase: 57 U/L (ref 27–131)

## 2023-08-25 LAB — NO CULTURE INDICATED

## 2023-08-25 LAB — C-REACTIVE PROTEIN: CRP: <1 mg/dL (ref 0.5–20.0)

## 2023-09-01 ENCOUNTER — Ambulatory Visit (HOSPITAL_BASED_OUTPATIENT_CLINIC_OR_DEPARTMENT_OTHER)
Admission: RE | Admit: 2023-09-01 | Discharge: 2023-09-01 | Disposition: A | Discharge status: Home / Self Care | Source: Ambulatory Visit | Attending: Family Medicine | Admitting: Family Medicine

## 2023-09-01 DIAGNOSIS — K811 Chronic cholecystitis: Secondary | ICD-10-CM | POA: Diagnosis not present

## 2023-09-01 DIAGNOSIS — R0789 Other chest pain: Secondary | ICD-10-CM | POA: Diagnosis not present

## 2023-09-01 DIAGNOSIS — R079 Chest pain, unspecified: Secondary | ICD-10-CM | POA: Diagnosis not present

## 2023-09-01 DIAGNOSIS — R161 Splenomegaly, not elsewhere classified: Secondary | ICD-10-CM | POA: Diagnosis not present

## 2023-09-01 DIAGNOSIS — K820 Obstruction of gallbladder: Secondary | ICD-10-CM | POA: Diagnosis not present

## 2023-09-03 NOTE — Progress Notes (Signed)
 [facility] Summary Report / Loop Recorder

## 2023-09-14 DIAGNOSIS — E113412 Type 2 diabetes mellitus with severe nonproliferative diabetic retinopathy with macular edema, left eye: Secondary | ICD-10-CM | POA: Diagnosis not present

## 2023-09-14 DIAGNOSIS — Z794 Long term (current) use of insulin: Secondary | ICD-10-CM | POA: Diagnosis not present

## 2023-09-14 DIAGNOSIS — E113411 Type 2 diabetes mellitus with severe nonproliferative diabetic retinopathy with macular edema, right eye: Secondary | ICD-10-CM | POA: Diagnosis not present

## 2023-09-14 DIAGNOSIS — H43823 Vitreomacular adhesion, bilateral: Secondary | ICD-10-CM | POA: Diagnosis not present

## 2023-09-15 DIAGNOSIS — E113412 Type 2 diabetes mellitus with severe nonproliferative diabetic retinopathy with macular edema, left eye: Secondary | ICD-10-CM | POA: Diagnosis not present

## 2023-09-16 ENCOUNTER — Ambulatory Visit (INDEPENDENT_AMBULATORY_CARE_PROVIDER_SITE_OTHER)

## 2023-09-16 DIAGNOSIS — I4891 Unspecified atrial fibrillation: Secondary | ICD-10-CM | POA: Diagnosis not present

## 2023-09-16 LAB — CUP PACEART REMOTE DEVICE CHECK
Date Time Interrogation Session: 20250813232414
Implantable Pulse Generator Implant Date: 20230407

## 2023-09-19 ENCOUNTER — Ambulatory Visit: Payer: Self-pay | Admitting: Internal Medicine

## 2023-09-19 ENCOUNTER — Other Ambulatory Visit: Payer: Self-pay | Admitting: Family Medicine

## 2023-09-19 DIAGNOSIS — N1831 Chronic kidney disease, stage 3a: Secondary | ICD-10-CM

## 2023-09-20 ENCOUNTER — Ambulatory Visit: Payer: Medicare Other

## 2023-09-22 ENCOUNTER — Encounter (HOSPITAL_COMMUNITY): Payer: Self-pay

## 2023-09-22 ENCOUNTER — Ambulatory Visit (HOSPITAL_COMMUNITY)
Admission: RE | Admit: 2023-09-22 | Discharge: 2023-09-22 | Disposition: A | Discharge status: Home / Self Care | Source: Ambulatory Visit | Attending: Family Medicine | Admitting: Family Medicine

## 2023-09-22 DIAGNOSIS — K759 Inflammatory liver disease, unspecified: Secondary | ICD-10-CM | POA: Diagnosis not present

## 2023-09-22 DIAGNOSIS — K7400 Hepatic fibrosis, unspecified: Secondary | ICD-10-CM | POA: Insufficient documentation

## 2023-09-22 DIAGNOSIS — R109 Unspecified abdominal pain: Secondary | ICD-10-CM | POA: Diagnosis not present

## 2023-09-22 DIAGNOSIS — Z944 Liver transplant status: Secondary | ICD-10-CM | POA: Diagnosis not present

## 2023-09-22 DIAGNOSIS — R932 Abnormal findings on diagnostic imaging of liver and biliary tract: Secondary | ICD-10-CM

## 2023-09-22 DIAGNOSIS — K760 Fatty (change of) liver, not elsewhere classified: Secondary | ICD-10-CM | POA: Diagnosis not present

## 2023-09-22 DIAGNOSIS — K8020 Calculus of gallbladder without cholecystitis without obstruction: Secondary | ICD-10-CM | POA: Diagnosis not present

## 2023-09-22 MED ORDER — TECHNETIUM TC 99M MEBROFENIN IV KIT
5.1000 | PACK | Freq: Once | INTRAVENOUS | Status: AC | PRN
  Administered 2023-09-22: 5.1 via INTRAVENOUS

## 2023-09-23 ENCOUNTER — Ambulatory Visit: Payer: Self-pay | Admitting: Family Medicine

## 2023-09-23 DIAGNOSIS — K828 Other specified diseases of gallbladder: Secondary | ICD-10-CM

## 2023-09-27 ENCOUNTER — Ambulatory Visit: Payer: Medicare Other

## 2023-09-29 ENCOUNTER — Ambulatory Visit (INDEPENDENT_AMBULATORY_CARE_PROVIDER_SITE_OTHER): Admitting: Podiatry

## 2023-09-29 ENCOUNTER — Encounter: Payer: Self-pay | Admitting: Podiatry

## 2023-09-29 DIAGNOSIS — E119 Type 2 diabetes mellitus without complications: Secondary | ICD-10-CM | POA: Diagnosis not present

## 2023-09-29 DIAGNOSIS — L84 Corns and callosities: Secondary | ICD-10-CM | POA: Diagnosis not present

## 2023-09-29 NOTE — Progress Notes (Signed)
  Subjective:  Patient ID: Joe Stewart, male    DOB: Oct 16, 1955,   MRN: [id number]  Chief Complaint  Patient presents with   Diabetes    Check this spot on my little toe, it's like a scab that won't go away.  Dr. Beverley Hummer - 08/24/2023; A1c - 7.8    68 y.o. male presents for follow-up of right foot  calluse. Denies pain. Has not followed up in a while. He is diabetic and last A1c was 7.4  . Denies any other pedal complaints. Denies n/v/f/c.   PCP: [facility] [employer] MD   Past Medical History:  Diagnosis Date   Arthritis    Cataract    Mixed OU   Diabetes mellitus without complication (HCC)    Diabetic retinopathy (HCC)    NPDR OU   Dysrhythmia    afib   Hyperlipidemia    Hypertension    Hypertensive retinopathy    OU   Obesity    Stroke [facility])     Objective:  Physical Exam: Vascular: DP/PT pulses 2/4 bilateral. CFT <3 seconds. Normal hair growth on digits. No edema.  Skin. No lacerations or abrasions bilateral feet. Right fifth lateral digit with wound healed. Undelrying hyperkeratotic tissue Some erythema and edema noted.  Musculoskeletal: MMT 5/5 bilateral lower extremities in DF, PF, Inversion and Eversion. Deceased ROM in DF of ankle joint.  Neurological: Sensation intact to light touch.   Assessment:   1. Pre-ulcerative calluses   2. Type 2 diabetes mellitus without complication, without long-term current use of insulin  (HCC)   3. Pain due to onychomycosis of toenails of both feet           Plan:  Patient was evaluated and treated and all questions answered. Ulcer right fifth toe- healed.  -Debridement of hyperkeratatotic tissue with healed ulcer underlying but very close to opening.  -Will keep toe cap over area for protection.  -Discussed glucose control and proper protein-rich diet.  -Discussed if any worsening redness, pain, fever or chills to call or may need to report to the emergency room. Patient expressed understanding.  -follow-up in 3 months  for recheck and rfc.   No follow-ups on file.  No follow-ups on file.   Asberry Failing, DPM

## 2023-09-29 NOTE — Telephone Encounter (Signed)
 Returned call. Patient advised that he was just returning call from before the appt was scheduled.

## 2023-10-12 DIAGNOSIS — K828 Other specified diseases of gallbladder: Secondary | ICD-10-CM | POA: Diagnosis not present

## 2023-10-18 ENCOUNTER — Ambulatory Visit (INDEPENDENT_AMBULATORY_CARE_PROVIDER_SITE_OTHER)

## 2023-10-18 DIAGNOSIS — I4891 Unspecified atrial fibrillation: Secondary | ICD-10-CM | POA: Diagnosis not present

## 2023-10-18 LAB — CUP PACEART REMOTE DEVICE CHECK
Date Time Interrogation Session: 20250913231053
Implantable Pulse Generator Implant Date: 20230407

## 2023-10-21 ENCOUNTER — Other Ambulatory Visit (HOSPITAL_COMMUNITY): Payer: Self-pay | Admitting: Internal Medicine

## 2023-10-22 ENCOUNTER — Ambulatory Visit: Payer: Self-pay | Admitting: Internal Medicine

## 2023-10-23 NOTE — Progress Notes (Signed)
 Remote Loop Recorder Transmission

## 2023-10-25 ENCOUNTER — Ambulatory Visit: Payer: Medicare Other

## 2023-10-27 NOTE — Progress Notes (Signed)
 Remote Loop Recorder Transmission

## 2023-11-01 ENCOUNTER — Ambulatory Visit: Payer: Medicare Other

## 2023-11-05 ENCOUNTER — Other Ambulatory Visit: Payer: Self-pay | Admitting: Family Medicine

## 2023-11-05 DIAGNOSIS — E1122 Type 2 diabetes mellitus with diabetic chronic kidney disease: Secondary | ICD-10-CM

## 2023-11-05 DIAGNOSIS — I152 Hypertension secondary to endocrine disorders: Secondary | ICD-10-CM

## 2023-11-08 ENCOUNTER — Other Ambulatory Visit: Payer: Self-pay

## 2023-11-08 ENCOUNTER — Other Ambulatory Visit: Payer: Self-pay | Admitting: Family Medicine

## 2023-11-08 DIAGNOSIS — I10 Essential (primary) hypertension: Secondary | ICD-10-CM

## 2023-11-08 DIAGNOSIS — E119 Type 2 diabetes mellitus without complications: Secondary | ICD-10-CM

## 2023-11-09 DIAGNOSIS — Z794 Long term (current) use of insulin: Secondary | ICD-10-CM | POA: Diagnosis not present

## 2023-11-09 DIAGNOSIS — E113411 Type 2 diabetes mellitus with severe nonproliferative diabetic retinopathy with macular edema, right eye: Secondary | ICD-10-CM | POA: Diagnosis not present

## 2023-11-09 DIAGNOSIS — E113412 Type 2 diabetes mellitus with severe nonproliferative diabetic retinopathy with macular edema, left eye: Secondary | ICD-10-CM | POA: Diagnosis not present

## 2023-11-09 DIAGNOSIS — H43823 Vitreomacular adhesion, bilateral: Secondary | ICD-10-CM | POA: Diagnosis not present

## 2023-11-09 MED ORDER — HYDROCHLOROTHIAZIDE 25 MG PO TABS: 25.0000 mg | ORAL_TABLET | Freq: Every day | ORAL | 0 refills | Status: DC

## 2023-11-11 DIAGNOSIS — E113412 Type 2 diabetes mellitus with severe nonproliferative diabetic retinopathy with macular edema, left eye: Secondary | ICD-10-CM | POA: Diagnosis not present

## 2023-11-11 DIAGNOSIS — H43823 Vitreomacular adhesion, bilateral: Secondary | ICD-10-CM | POA: Diagnosis not present

## 2023-11-11 DIAGNOSIS — Z794 Long term (current) use of insulin: Secondary | ICD-10-CM | POA: Diagnosis not present

## 2023-11-11 DIAGNOSIS — E113411 Type 2 diabetes mellitus with severe nonproliferative diabetic retinopathy with macular edema, right eye: Secondary | ICD-10-CM | POA: Diagnosis not present

## 2023-11-11 LAB — OPHTHALMOLOGY REPORT-SCANNED

## 2023-11-11 NOTE — Progress Notes (Signed)
 Remote Loop Recorder Transmission

## 2023-11-16 ENCOUNTER — Ambulatory Visit (INDEPENDENT_AMBULATORY_CARE_PROVIDER_SITE_OTHER)

## 2023-11-16 DIAGNOSIS — I480 Paroxysmal atrial fibrillation: Secondary | ICD-10-CM | POA: Diagnosis not present

## 2023-11-16 LAB — CUP PACEART REMOTE DEVICE CHECK
Date Time Interrogation Session: 20251013231737
Implantable Pulse Generator Implant Date: 20230407

## 2023-11-17 NOTE — Progress Notes (Signed)
 Remote Loop Recorder Transmission

## 2023-11-18 ENCOUNTER — Ambulatory Visit

## 2023-11-18 ENCOUNTER — Ambulatory Visit: Payer: Self-pay | Admitting: Internal Medicine

## 2023-11-29 ENCOUNTER — Ambulatory Visit: Payer: Medicare Other

## 2023-12-02 ENCOUNTER — Encounter: Payer: Self-pay | Admitting: Family Medicine

## 2023-12-02 ENCOUNTER — Ambulatory Visit (INDEPENDENT_AMBULATORY_CARE_PROVIDER_SITE_OTHER): Admitting: Family Medicine

## 2023-12-02 VITALS — BP 115/70 | HR 80 | Temp 97.2°F | Resp 18 | Wt 265.0 lb

## 2023-12-02 DIAGNOSIS — E66813 Obesity, class 3: Secondary | ICD-10-CM

## 2023-12-02 DIAGNOSIS — Z23 Encounter for immunization: Secondary | ICD-10-CM

## 2023-12-02 DIAGNOSIS — I10 Essential (primary) hypertension: Secondary | ICD-10-CM | POA: Diagnosis not present

## 2023-12-02 DIAGNOSIS — R0789 Other chest pain: Secondary | ICD-10-CM

## 2023-12-02 DIAGNOSIS — E1169 Type 2 diabetes mellitus with other specified complication: Secondary | ICD-10-CM

## 2023-12-02 DIAGNOSIS — E785 Hyperlipidemia, unspecified: Secondary | ICD-10-CM

## 2023-12-02 DIAGNOSIS — K760 Fatty (change of) liver, not elsewhere classified: Secondary | ICD-10-CM

## 2023-12-02 DIAGNOSIS — I480 Paroxysmal atrial fibrillation: Secondary | ICD-10-CM

## 2023-12-02 DIAGNOSIS — I2510 Atherosclerotic heart disease of native coronary artery without angina pectoris: Secondary | ICD-10-CM

## 2023-12-02 DIAGNOSIS — N1831 Chronic kidney disease, stage 3a: Secondary | ICD-10-CM | POA: Diagnosis not present

## 2023-12-02 DIAGNOSIS — E1122 Type 2 diabetes mellitus with diabetic chronic kidney disease: Secondary | ICD-10-CM | POA: Diagnosis not present

## 2023-12-02 DIAGNOSIS — R932 Abnormal findings on diagnostic imaging of liver and biliary tract: Secondary | ICD-10-CM

## 2023-12-02 DIAGNOSIS — Z6841 Body Mass Index (BMI) 40.0 and over, adult: Secondary | ICD-10-CM

## 2023-12-02 DIAGNOSIS — K7400 Hepatic fibrosis, unspecified: Secondary | ICD-10-CM

## 2023-12-02 DIAGNOSIS — R161 Splenomegaly, not elsewhere classified: Secondary | ICD-10-CM

## 2023-12-02 LAB — POCT GLYCOSYLATED HEMOGLOBIN (HGB A1C): Hemoglobin A1C: 6.8 % — AB (ref 4.0–5.6)

## 2023-12-02 MED ORDER — ATORVASTATIN CALCIUM 80 MG PO TABS: 80.0000 mg | ORAL_TABLET | Freq: Every day | ORAL | 3 refills | Status: AC

## 2023-12-02 MED ORDER — SEMAGLUTIDE (1 MG/DOSE) 4 MG/3ML ~~LOC~~ SOPN: 1.0000 mg | PEN_INJECTOR | SUBCUTANEOUS | 3 refills | Status: AC

## 2023-12-02 MED ORDER — AMLODIPINE-VALSARTAN-HCTZ 10-320-25 MG PO TABS: 1.0000 | ORAL_TABLET | Freq: Every day | ORAL | 3 refills | Status: AC

## 2023-12-02 MED ORDER — EMPAGLIFLOZIN 25 MG PO TABS: 25.0000 mg | ORAL_TABLET | Freq: Every day | ORAL | 3 refills | Status: AC

## 2023-12-02 NOTE — Patient Instructions (Addendum)
  VISIT SUMMARY: Today, we reviewed your diabetes management, blood pressure, liver health, and other ongoing health concerns. Your diabetes control has improved, and we made some adjustments to your medications. We also discussed your blood pressure, liver health, and general health maintenance.  YOUR PLAN: TYPE 2 DIABETES MELLITUS WITH DIABETIC CHRONIC KIDNEY DISEASE: Your diabetes is well-controlled with an A1c of 6.8%, improved from 7.8%. Your kidney function is stable, though we previously noted some protein in your urine. -Increase Ozempic  to 1 mg for weight and liver health benefits. -Discontinue glipizide  due to adequate glycemic control. -Monitor your blood sugar and any gastrointestinal side effects. -Recheck kidney function and protein levels in your urine. -Obtain a blood sample to check your creatinine level.  HYPERTENSION: Your blood pressure is generally well-controlled at home but was elevated in the office today, likely due to dietary factors. -Monitor your blood pressure at home. -Bring your home blood pressure cuff to the next visit for calibration comparison. -Discontinue amlodipine , losartan , and hydrochlorothiazide .  We are switching to a combination pill of amlodipine , valsartan, and hydrochlorothiazide  to reduce the number of pills you take.  HEPATIC STEATOSIS WITH ABNORMAL LIVER IMAGING: You have fatty liver disease without cirrhosis, and previous imaging showed abnormal liver findings. -Order advanced liver fibrosis test and AFP. -Refer to hepatology for further evaluation.  ATRIAL FIBRILLATION ON ANTICOAGULATION: Your atrial fibrillation is managed with Eliquis  5 mg twice daily, and you have no new symptoms. -Continue taking Eliquis  as prescribed.  GENERAL HEALTH MAINTENANCE: Flu vaccination is recommended due to the anticipated severe flu season. -Administer flu shot.  FOLLOW-UP: We need to see you again to monitor your progress. -Schedule a follow-up  appointment in three months.

## 2023-12-02 NOTE — Progress Notes (Signed)
 Assessment & Plan   Assessment/Plan:    Assessment and Plan Assessment & Plan Type 2 diabetes mellitus with diabetic chronic kidney disease Type 2 diabetes mellitus is well-controlled with an A1c of 6.8, improved from 7.8. Current regimen includes metformin , Ozempic , glipizide , and Jardiance . Diabetic chronic kidney disease is stable, with a previous slight decline in kidney function likely due to dehydration. Proteinuria was noted previously. - Increase Ozempic  to 1 mg for weight, cardiovascular, diabetes and liver health benefits - Discontinue glipizide  due to adequate glycemic control - Monitor blood sugar and gastrointestinal side effects - Recheck kidney function and proteinuria - Obtain blood sample for creatinine level  Hypertension Hypertension is generally well-controlled at home with readings around 115/70 mmHg, though elevated to 168/82 mmHg in the office, likely due to dietary factors. Current medications include amlodipine , losartan , metoprolol , and hydrochlorothiazide . - Monitor blood pressure at home - Bring home blood pressure cuff to next visit for calibration comparison - Transition to a combination pill of amlodipine , valsartan, and hydrochlorothiazide  to reduce pill burden  Hepatic steatosis secondary to metabolic dysfunction associated steatotic liver disease Hepatic steatosis is present without cirrhotic changes. Previous imaging showed abnormal liver findings. High risk for non-alcoholic fatty liver disease (NAFLD) with elevated LFTs. - Order ELF test and AFP - Follow-up with hepatology for further evaluation  Atrial fibrillation on anticoagulation Atrial fibrillation is managed with Eliquis  5 mg twice daily. No new symptoms reported.  General Health Maintenance Flu vaccination is recommended due to anticipated severe flu season. - Administer flu shot  Follow-Up - Schedule follow-up appointment in three months      Medications Discontinued During This  Encounter  Medication Reason   amLODipine  (NORVASC ) 5 MG tablet    glipiZIDE  (GLUCOTROL ) 10 MG tablet    losartan  (COZAAR ) 100 MG tablet    hydrochlorothiazide  (HYDRODIURIL ) 25 MG tablet    BOOSTRIX 5-2.5-18.5 LF-MCG/0.5 injection    Semaglutide ,0.25 or 0.5MG /DOS, (OZEMPIC , 0.25 OR 0.5 MG/DOSE,) 2 MG/3ML SOPN    atorvastatin  (LIPITOR ) 80 MG tablet Reorder   JARDIANCE  25 MG TABS tablet Reorder    Return in about 3 months (around 03/03/2024) for DM, BP, HLD, fasting labs.        Subjective:   Encounter date: 12/02/2023   Joe Stewart is a 68 y.o. male who has Essential hypertension; Abnormal nuclear stress test; Type 2 diabetes mellitus with stage 3a chronic kidney disease, without long-term current use of insulin  (HCC); Arthritis of left knee; Cecal polyp; History of colon polyps; Abnormal colonoscopy; Anemia; Hyperlipidemia associated with type 2 diabetes mellitus (HCC); Snores; History of ischemic stroke; Familial hypercholesterolemia; Coronary artery disease involving native coronary artery of native heart without angina pectoris; Class 3 severe obesity due to excess calories with serious comorbidity and body mass index (BMI) of 40.0 to 44.9 in adult [facility]); Epistaxis; Paroxysmal atrial fibrillation (HCC); Hypercoagulable state due to paroxysmal atrial fibrillation (HCC); LVH (left ventricular hypertrophy); Dizzy spells; Abnormal LFTs; Chronic anticoagulation; Hx of adenomatous colonic polyps; Gambling and betting; Moderate nonproliferative diabetic retinopathy of both eyes with macular edema associated with type 2 diabetes mellitus (HCC); and 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   He  has a past medical history of Arthritis, Cataract, Diabetes mellitus without complication (HCC), Diabetic retinopathy (HCC), Dysrhythmia, Hyperlipidemia, Hypertension, Hypertensive retinopathy, Obesity, and Stroke (HCC).SABRA   He presents with chief complaint of  Diabetes (Pt is not fasting today//HM due - flu vaccine ) and Hypertension (Pt blood pressure at home  range from 115/70)   Discussed the use of AI scribe software for clinical note transcription with the patient, who gave verbal consent to proceed.  History of Present Illness         Discussed the use of AI scribe software for clinical note transcription with the patient, who gave verbal consent to proceed.  History of Present Illness Diabetes Mellitus (Type II) and Glycemic Control  Ongoing management with improved HbA1c from 7.8% (May 2025) to 6.8%. Current medications: Metformin  500 mg BID, Glipizide  10 mg BID, Jardiance  25 mg daily, Ozempic  0.5 mg weekly. Reports belching and gas with Ozempic ; no significant nausea or abdominal pain. Diabetic eye exam (11/11/2023) revealed retinopathy. History of diabetic neuropathy with decreased foot sensation; last B12 normal (435 in June 2025). Followed by podiatrist (last visit 09/29/2023).  Chronic Kidney Disease (Stage 3a)  Creatinine 1.24 (July 2025). Past proteinuria and slight decline in renal function, possibly dehydration-related; patient willing to provide urine sample for re-evaluation.  Obesity  Weight: 264 lbs (July 2025).  Hepatic Steatosis and Liver Function  History of elevated LFTs and hepatic steatosis; elastography performed; referred to hepatology.  Hypertension  Managed with Amlodipine  5 mg, Hydrochlorothiazide  25 mg, Losartan  100 mg, Metoprolol  50 mg daily. Home BP averages ~115/70 mmHg; clinic BP today 168/82 mmHg (likely dietary factors: ham and coffee with creamer). Last office BP (July 2025): 136/80 mmHg.  Atrial Fibrillation  On Eliquis  5 mg BID; followed by cardiology and electrophysiology. Remote pacer shows no recent Afib episodes; denies chest pain, SOB, or headaches.  Coronary Artery Disease  On Atorvastatin  80 mg daily; lipid panel stable (June 2025).  Gallbladder Symptoms  Previously evaluated  for gallbladder pain; no current issues.  ROS  Past Surgical History:  Procedure Laterality Date   BIOPSY  11/29/2019   Procedure: BIOPSY;  Surgeon: Wilhelmenia Aloha Raddle., MD;  Location: [facility];  Service: Gastroenterology;;   COLONOSCOPY WITH PROPOFOL  N/A 11/29/2019   Procedure: COLONOSCOPY WITH PROPOFOL ;  Surgeon: Wilhelmenia Aloha Raddle., MD;  Location: [facility];  Service: Gastroenterology;  Laterality: N/A;   COLONOSCOPY WITH PROPOFOL  N/A 05/13/2023   Procedure: COLONOSCOPY WITH PROPOFOL ;  Surgeon: Mansouraty, Aloha Raddle., MD;  Location: [facility];  Service: Gastroenterology;  Laterality: N/A;   ENDOSCOPIC MUCOSAL RESECTION N/A 11/29/2019   Procedure: ENDOSCOPIC MUCOSAL RESECTION;  Surgeon: Wilhelmenia Aloha Raddle., MD;  Location: [facility];  Service: Gastroenterology;  Laterality: N/A;   ESOPHAGOGASTRODUODENOSCOPY (EGD) WITH PROPOFOL  N/A 11/29/2019   Procedure: ESOPHAGOGASTRODUODENOSCOPY (EGD) WITH PROPOFOL ;  Surgeon: Wilhelmenia Aloha Raddle., MD;  Location: [facility];  Service: Gastroenterology;  Laterality: N/A;   EYE SURGERY Left    had to have eye flushed after getting costic sodium in L eye   HEMOSTASIS CLIP PLACEMENT  11/29/2019   Procedure: HEMOSTASIS CLIP PLACEMENT;  Surgeon: Wilhelmenia Aloha Raddle., MD;  Location: [facility];  Service: Gastroenterology;;   HERNIA REPAIR     HERNIA REPAIR     35 years ago   HOT HEMOSTASIS N/A 11/29/2019   Procedure: HOT HEMOSTASIS (ARGON PLASMA COAGULATION/BICAP);  Surgeon: Wilhelmenia Aloha Raddle., MD;  Location: [facility];  Service: Gastroenterology;  Laterality: N/A;   JOINT REPLACEMENT  2021   Left knee replacement   LEFT HEART CATH AND CORONARY ANGIOGRAPHY N/A 04/11/2019   Procedure: LEFT HEART CATH AND CORONARY ANGIOGRAPHY;  Surgeon: Ladona Heinz, MD;  Location: [facility];  Service: Cardiovascular;  Laterality: N/A;   POLYPECTOMY  11/29/2019   Procedure: POLYPECTOMY;  Surgeon: Wilhelmenia Aloha Raddle., MD;  Location: [facility];  Service: Gastroenterology;;   POLYPECTOMY  05/13/2023   Procedure: POLYPECTOMY, INTESTINE;  Surgeon: Wilhelmenia Aloha Raddle., MD;  Location: [facility];  Service: Gastroenterology;;   RENAL ANGIOGRAPHY Bilateral 04/11/2019   Procedure: RENAL ANGIOGRAPHY;  Surgeon: Ladona Heinz, MD;  Location: [facility];  Service: Cardiovascular;  Laterality: Bilateral;   SUBMUCOSAL LIFTING INJECTION  11/29/2019   Procedure: SUBMUCOSAL LIFTING INJECTION;  Surgeon: Wilhelmenia Aloha Raddle., MD;  Location: [facility];  Service: Gastroenterology;;   TOTAL KNEE ARTHROPLASTY Left 08/01/2019   Procedure: LEFT TOTAL KNEE ARTHROPLASTY;  Surgeon: Addie Cordella Hamilton, MD;  Location: [facility] OR;  Service: Orthopedics;  Laterality: Left;    Current Outpatient Medications on File Prior to Visit  Medication Sig Dispense Refill   blood glucose meter kit and supplies KIT Dispense based on patient and insurance preference. Use up to four times daily as directed. 1 each 0   ELIQUIS  5 MG TABS tablet TAKE 1 TABLET BY MOUTH TWICE A DAY 60 tablet 6   metFORMIN  (GLUCOPHAGE ) 500 MG tablet TAKE 1 TABLET BY MOUTH 2 TIMES DAILY WITH A MEAL. 180 tablet 3   metoprolol  succinate (TOPROL -XL) 50 MG 24 hr tablet TAKE 1 TABLET BY MOUTH DAILY. TAKE WITH OR IMMEDIATELY FOLLOWING A MEAL. 90 tablet 3   No current facility-administered medications on file prior to visit.   Family History  Problem Relation Age of Onset   Diabetes Mother    COPD Mother    Cancer Mother        unknown type    Heart disease Father    COPD Sister    Healthy Brother    Heart disease Brother    Healthy Son    Colon cancer Neg Hx    Colon polyps Neg Hx    Esophageal cancer Neg Hx    Rectal cancer Neg Hx    Stomach cancer Neg Hx    Inflammatory bowel disease Neg Hx    Liver disease Neg Hx    Pancreatic cancer Neg Hx     Social History   Socioeconomic History   Marital status: Single    Spouse name: Not on file   Number of  children: 1   Years of education: Not on file   Highest education level: 12th grade  Occupational History   Not on file  Tobacco Use   Smoking status: Never   Smokeless tobacco: Never  Vaping Use   Vaping status: Never Used  Substance and Sexual Activity   Alcohol use: Not Currently   Drug use: Never   Sexual activity: Not Currently  Other Topics Concern   Not on file  Social History Narrative   Not on file   Social Drivers of Health   Financial Resource Strain: Low Risk  (12/01/2023)   Overall Financial Resource Strain (CARDIA)    Difficulty of Paying Living Expenses: Not hard at all  Food Insecurity: No Food Insecurity (12/01/2023)   Hunger Vital Sign    Worried About Running Out of Food in the Last Year: Never true    Ran Out of Food in the Last Year: Never true  Transportation Needs: No Transportation Needs (12/01/2023)   PRAPARE - Administrator, Civil Service (Medical): No    Lack of Transportation (Non-Medical): No  Physical Activity: Insufficiently Active (12/01/2023)   Exercise Vital Sign    Days of Exercise per Week: 1 day    Minutes of Exercise per Session: 20 min  Stress: No  Stress Concern Present (12/01/2023)   [employer] of Occupational Health - Occupational Stress Questionnaire    Feeling of Stress: Not at all  Social Connections: Moderately Isolated (12/01/2023)   Social Connection and Isolation Panel    Frequency of Communication with Friends and Family: More than three times a week    Frequency of Social Gatherings with Friends and Family: Three times a week    Attends Religious Services: 1 to 4 times per year    Active Member of Clubs or Organizations: No    Attends Banker Meetings: Not on file    Marital Status: Divorced  Intimate Partner Violence: Not At Risk (05/28/2021)   Humiliation, Afraid, Rape, and Kick questionnaire    Fear of Current or Ex-Partner: No    Emotionally Abused: No    Physically Abused: No     Sexually Abused: No                                                                                                  Objective:  Physical Exam: BP 115/70 Comment: @home  reading  Pulse 80   Temp (!) 97.2 F (36.2 C) (Temporal)   Resp 18   Wt 265 lb (120.2 kg)   SpO2 100%   BMI 40.29 kg/m   Wt Readings from Last 3 Encounters:  12/02/23 265 lb (120.2 kg)  08/24/23 264 lb 3.2 oz (119.8 kg)  07/08/23 263 lb 9.6 oz (119.6 kg)    BP Readings from Last 3 Encounters:  12/02/23 115/70  08/24/23 136/80  07/08/23 128/74    Fibrosis 4 Score = 2.01  Fib-4 interpretation is not validated for people under 35 or over 34 years of age. However, scores under 2.0 are generally considered low risk.  Physical Exam Constitutional:      Appearance: Normal appearance.  HENT:     Head: Normocephalic and atraumatic.     Right Ear: Hearing normal.     Left Ear: Hearing normal.     Nose: Nose normal.  Eyes:     General: No scleral icterus.       Right eye: No discharge.        Left eye: No discharge.     Extraocular Movements: Extraocular movements intact.  Cardiovascular:     Rate and Rhythm: Normal rate and regular rhythm.     Heart sounds: Normal heart sounds.  Pulmonary:     Effort: Pulmonary effort is normal.     Breath sounds: Normal breath sounds.  Abdominal:     Palpations: Abdomen is soft.     Tenderness: There is no abdominal tenderness.  Skin:    General: Skin is warm.     Findings: No rash.  Neurological:     General: No focal deficit present.     Mental Status: He is alert.     Cranial Nerves: No cranial nerve deficit.  Psychiatric:        Mood and Affect: Mood normal.        Behavior: Behavior normal.        Thought Content: Thought content normal.  Judgment: Judgment normal.     CUP PACEART REMOTE DEVICE CHECK Result Date: 11/16/2023 ILR summary report received. Battery status OK. Normal device function. No new symptom, tachy, brady, or pause episodes. No  new AF episodes. Monthly summary reports and ROV/PRN LA, CVRS  CUP PACEART REMOTE DEVICE CHECK Result Date: 10/18/2023 ILR summary report received. Battery status OK. Normal device function. No new symptom, tachy, brady, or pause episodes. 1 new AF episode on 09/26/23 at 02:16, 2 hrs 42 min, EGM c/w AF, avg V rate 105 bpm, on Eliquis  per Epic. Monthly summary reports and ROV/PRN. [facility], CVRS  US  ELASTOGRAPHY LIVER Result Date: 09/26/2023 CLINICAL DATA:  Hepatic fibrosis EXAM: US  ELASTOGRAPHY HEPATIC TECHNIQUE: Sonography of the liver was performed. In addition, ultrasound elastography evaluation of the liver was performed. A region of interest was placed within the right lobe of the liver. Following application of a compressive sonographic pulse, tissue compressibility was assessed. Multiple assessments were performed at the selected site. Median tissue compressibility was determined. Previously, hepatic stiffness was assessed by shear wave velocity. Based on recently published Society of Radiologists in Ultrasound consensus article, reporting is now recommended to be performed in the SI units of pressure (kiloPascals) representing hepatic stiffness/elasticity. The obtained result is compared to the published reference standards. (cACLD = compensated Advanced Chronic Liver Disease) COMPARISON:  None Available. FINDINGS: Liver: No focal lesion identified. Increased parenchymal echogenicity. Portal vein is patent on color Doppler imaging with normal direction of blood flow towards the liver. ULTRASOUND HEPATIC ELASTOGRAPHY Device: Siemens Helix VTQ Patient position: Supine Transducer: DAX Number of measurements: 12 Hepatic segment:  8 Median kPa: 6.0 IQR: 6.0 IQR/Median kPa ratio: 1.0 Data quality: i. IQR/Median kPa ratio of 0.6 or greater when using the DAX probe indicates reduced accuracy Diagnostic category: < or = 9 kPa: in the absence of other known clinical signs, rules out cACLD The use of hepatic  elastography is applicable to patients with viral hepatitis and non-alcoholic fatty liver disease. At this time, there is insufficient data for the referenced cut-off values and use in other causes of liver disease, including alcoholic liver disease. Patients, however, may be assessed by elastography and serve as their own reference standard/baseline. In patients with non-alcoholic liver disease, the values suggesting compensated advanced chronic liver disease (cACLD) may be lower, and patients may need additional testing with elasticity results of 7-9 kPa. Please note that abnormal hepatic elasticity and shear wave velocities may also be identified in clinical settings other than with hepatic fibrosis, such as: acute hepatitis, elevated right heart and central venous pressures including use of beta blockers, veno-occlusive disease (Budd-Chiari), infiltrative processes such as mastocytosis/amyloidosis/infiltrative tumor/lymphoma, extrahepatic cholestasis, with hyperemia in the post-prandial state, and with liver transplantation. Correlation with patient history, laboratory data, and clinical condition recommended. Diagnostic Categories: < or =5 kPa: high probability of being normal < or =9 kPa: in the absence of other known clinical signs, rules out cACLD >9 kPa and ?13 kPa: suggestive of cACLD, but needs further testing >13 kPa: highly suggestive of cACLD > or =17 kPa: highly suggestive of cACLD with an increased probability of clinically significant portal hypertension IMPRESSION: ULTRASOUND LIVER: Hepatic steatosis. ULTRASOUND HEPATIC ELASTOGRAPHY: Median kPa:  6.0 Diagnostic category: < or = 9 kPa: in the absence of other known clinical signs, rules out cACLD In the setting of elevated liver function tests, non-fasting state, or vascular congestion, the stage of liver fibrosis may be overestimated. In some patients with NAFLD, the cut-off values for cACLD may  be lower (7-9 kPa). In causes other than viral  hepatitis and NAFLD, the cut-off values are not well established. Electronically Signed   By: Marolyn JONETTA Jaksch M.D.   On: 09/26/2023 06:58   NM Hepato W/EF Result Date: 09/22/2023 CLINICAL DATA:  Cholelithiasis.  Right lateral side of abdomen pain EXAM: NUCLEAR MEDICINE HEPATOBILIARY IMAGING WITH GALLBLADDER EF TECHNIQUE: Sequential images of the abdomen were obtained out to 60 minutes following intravenous administration of radiopharmaceutical. After oral ingestion of Ensure, gallbladder ejection fraction was determined. At 60 min, normal ejection fraction is greater than 33%. RADIOPHARMACEUTICALS:  5.1 mCi Tc-22m  Choletec  IV COMPARISON:  U/S 09/01/2023 FINDINGS: Prompt uptake and biliary excretion of activity by the liver is seen. Gallbladder activity is visualized, consistent with patency of cystic duct. Biliary activity passes into small bowel, consistent with patent common bile duct. Calculated gallbladder ejection fraction is 29%. (Normal gallbladder ejection fraction with Ensure is greater than 33% and less than 80%.) IMPRESSION: Patent cystic and common bile ducts. Abnormally low gallbladder ejection fraction can be seen with chronic cholecystitis or biliary dyskinesia. Electronically Signed   By: Norman Gatlin M.D.   On: 09/22/2023 21:31   CUP PACEART REMOTE DEVICE CHECK Result Date: 09/16/2023 ILR summary report received. Battery status OK. Normal device function. No new symptom, tachy, brady, or pause episodes. No new AF episodes. Monthly summary reports and ROV/PRN AB, CVRS   Recent Results (from the past 2160 hours)  CUP PACEART REMOTE DEVICE CHECK     Status: None   Collection Time: 09/15/23 11:24 PM  Result Value Ref Range   Date Time Interrogation Session [id number]    Pulse Generator Manufacturer MERM    Pulse Gen Model LNQ22 LINQ II    Pulse Gen Serial Number [id number]    Clinic Name [facility]    Implantable Pulse Generator Type ICM/ILR    Implantable Pulse Generator  Implant Date [id number]   CUP PACEART REMOTE DEVICE CHECK     Status: None   Collection Time: 10/16/23 11:10 PM  Result Value Ref Range   Date Time Interrogation Session [phone]    Pulse Generator Manufacturer MERM    Pulse Gen Model LNQ22 LINQ II    Pulse Gen Serial Number [id number]    Clinic Name [facility]    Implantable Pulse Generator Type ICM/ILR    Implantable Pulse Generator Implant Date [id number]   OPHTHALMOLOGY REPORT-SCANNED     Status: Abnormal   Collection Time: 11/11/23 12:47 PM  Result Value Ref Range   HM Diabetic Eye Exam Retinopathy (A) No Retinopathy    Comment: ABS BY HIM   A Comment    CUP PACEART REMOTE DEVICE CHECK     Status: None   Collection Time: 11/15/23 11:17 PM  Result Value Ref Range   Date Time Interrogation Session [phone]    Pulse Generator Manufacturer MERM    Pulse Gen Model LNQ22 LINQ II    Pulse Gen Serial Number [id number]    Clinic Name [facility]    Implantable Pulse Generator Type ICM/ILR    Implantable Pulse Generator Implant Date [id number]   POCT glycosylated hemoglobin (Hb A1C)     Status: Abnormal   Collection Time: 12/02/23  2:25 PM  Result Value Ref Range   Hemoglobin A1C 6.8 (A) 4.0 - 5.6 %   HbA1c POC (<> result, manual entry)     HbA1c, POC (prediabetic range)     HbA1c, POC (controlled diabetic range)    Comp Met (CMET)  Status: Abnormal   Collection Time: 12/02/23  3:33 PM  Result Value Ref Range   Sodium 141 135 - 145 mEq/L   Potassium 4.3 3.5 - 5.1 mEq/L   Chloride 105 96 - 112 mEq/L   CO2 26 19 - 32 mEq/L    Comment: Elevated LDH levels may cause falsely increased CO2 results. If LDH is >2000 U/L, a positive bias of 12% is possible.   Glucose, Bld 84 70 - 99 mg/dL   BUN 26 (H) 6 - 23 mg/dL   Creatinine, Ser 8.77 0.40 - 1.50 mg/dL   Total Bilirubin 0.6 0.2 - 1.2 mg/dL   Alkaline Phosphatase 78 39 - 117 U/L   AST 28 0 - 37 U/L   ALT 27 0 - 53 U/L   Total Protein 7.9 6.0 - 8.3 g/dL   Albumin  4.3 3.5 - 5.2 g/dL   GFR 39.80 >39.99 mL/min    Comment: Calculated using the CKD-EPI Creatinine Equation (2021)   Calcium  8.8 8.4 - 10.5 mg/dL        Beverley KATHEE Hummer, MD  I,Emily Lagle,acting as a scribe for Beverley KATHEE Hummer, MD.,have documented all relevant documentation on the behalf of Beverley KATHEE Hummer, MD,as directed by  Beverley KATHEE Hummer, MD while in the presence of Beverley KATHEE Hummer, MD.   I, Beverley KATHEE Hummer, MD, have reviewed all documentation for this visit. The documentation on 12/02/2023 for the exam, diagnosis, procedures, and orders are all accurate and complete.

## 2023-12-03 ENCOUNTER — Ambulatory Visit

## 2023-12-03 LAB — COMPREHENSIVE METABOLIC PANEL WITH GFR
ALT: 27 U/L (ref 0–53)
AST: 28 U/L (ref 0–37)
Albumin: 4.3 g/dL (ref 3.5–5.2)
Alkaline Phosphatase: 78 U/L (ref 39–117)
BUN: 26 mg/dL — ABNORMAL HIGH (ref 6–23)
CO2: 26 meq/L (ref 19–32)
Calcium: 8.8 mg/dL (ref 8.4–10.5)
Chloride: 105 meq/L (ref 96–112)
Creatinine, Ser: 1.22 mg/dL (ref 0.40–1.50)
GFR: 60.19 mL/min (ref >60.00)
Glucose, Bld: 84 mg/dL (ref 70–99)
Potassium: 4.3 meq/L (ref 3.5–5.1)
Sodium: 141 meq/L (ref 135–145)
Total Bilirubin: 0.6 mg/dL (ref 0.2–1.2)
Total Protein: 7.9 g/dL (ref 6.0–8.3)

## 2023-12-05 ENCOUNTER — Encounter: Payer: Self-pay | Admitting: Family Medicine

## 2023-12-05 ENCOUNTER — Ambulatory Visit: Payer: Self-pay | Admitting: Family Medicine

## 2023-12-05 DIAGNOSIS — K7400 Hepatic fibrosis, unspecified: Secondary | ICD-10-CM | POA: Insufficient documentation

## 2023-12-06 ENCOUNTER — Ambulatory Visit: Payer: Medicare Other

## 2023-12-08 ENCOUNTER — Other Ambulatory Visit: Payer: Self-pay | Admitting: Internal Medicine

## 2023-12-08 LAB — ENHANCED LIVER FIBROSIS (ELF): ENHANCED LIVER FIBROSIS (ELF) SCORE: 10.01 — ABNORMAL HIGH (ref <9.80)

## 2023-12-08 LAB — AFP TUMOR MARKER: AFP-Tumor Marker: 3.9 ng/mL (ref <6.1)

## 2023-12-17 ENCOUNTER — Ambulatory Visit

## 2023-12-17 DIAGNOSIS — I480 Paroxysmal atrial fibrillation: Secondary | ICD-10-CM

## 2023-12-19 ENCOUNTER — Ambulatory Visit: Payer: Self-pay | Admitting: Internal Medicine

## 2023-12-19 LAB — CUP PACEART REMOTE DEVICE CHECK
Date Time Interrogation Session: 20251113231305
Implantable Pulse Generator Implant Date: 20230407

## 2023-12-20 ENCOUNTER — Ambulatory Visit

## 2023-12-21 NOTE — Progress Notes (Signed)
 Remote Loop Recorder Transmission

## 2023-12-29 ENCOUNTER — Ambulatory Visit: Admitting: Podiatry

## 2023-12-29 NOTE — Progress Notes (Signed)
 Joe Stewart                                          MRN: [id number]   12/29/2023   The [facility] Quality Team Specialist reviewed this patient medical record for the purposes of chart review for care gap closure. The following were reviewed: abstraction for care gap closure-glycemic status assessment. KED closed in [facility] portal.    [facility] Quality Team

## 2024-01-03 ENCOUNTER — Ambulatory Visit: Payer: Medicare Other

## 2024-01-07 ENCOUNTER — Ambulatory Visit (HOSPITAL_COMMUNITY): Admitting: Internal Medicine

## 2024-01-10 ENCOUNTER — Ambulatory Visit: Payer: Medicare Other

## 2024-01-17 ENCOUNTER — Ambulatory Visit

## 2024-01-17 DIAGNOSIS — I480 Paroxysmal atrial fibrillation: Secondary | ICD-10-CM

## 2024-01-18 LAB — CUP PACEART REMOTE DEVICE CHECK
Date Time Interrogation Session: 20251214231925
Implantable Pulse Generator Implant Date: 20230407

## 2024-01-18 LAB — OPHTHALMOLOGY REPORT-SCANNED

## 2024-01-19 ENCOUNTER — Encounter (HOSPITAL_COMMUNITY): Payer: Self-pay | Admitting: Internal Medicine

## 2024-01-19 ENCOUNTER — Ambulatory Visit (HOSPITAL_COMMUNITY): Admission: RE | Admit: 2024-01-19 | Discharge: 2024-01-19 | Attending: Internal Medicine | Admitting: Internal Medicine

## 2024-01-19 VITALS — BP 134/72 | HR 76 | Ht 68.0 in | Wt 256.0 lb

## 2024-01-19 DIAGNOSIS — D6869 Other thrombophilia: Secondary | ICD-10-CM

## 2024-01-19 DIAGNOSIS — I480 Paroxysmal atrial fibrillation: Secondary | ICD-10-CM

## 2024-01-19 NOTE — Progress Notes (Signed)
 Primary Care Physician: Joe Beverley NOVAK, MD Primary Cardiologist: Dr. Santo Primary Electrophysiologist: Dr. Waddell Referring Physician:    [name] is a 68 y.o. male with a history of moderate nonobstructive CAD, CKD stage IIIa, DM2, HTN, familial triglyceridemia, prior stroke s/p ILR placement, and paroxysmal atrial fibrillation who presents for consultation in the [facility]. He was evaluated by Dr. Waddell on 05/09/21 for cryptogenic stroke and had ILR placed. The patient was recently diagnosed with atrial fibrillation due to ILR alert demonstrating ~6 hr episode of Afib on 05/09/22 with max HR 130. Patient has a CHADS2VASC score of 6.  On evaluation 05/12/22, he is in SR today. ILR alert for new episode of Afib was for ~6 hours in duration and began at 2345 that evening. He states he did not feel that episode and was most likely asleep. He had won $1,300 earlier that evening playing blackjack in casino but did not feel any symptoms at that time. He is currently on ASA 81 mg daily and had a nosebleed 3 weeks ago for the first time which was odd to him. No more epistaxis since then. He does not drink alcohol. His brother and father were diagnosed with Afib; father is deceased. Some snoring with sleep but seems to have improved now that he is sleeping on his side.   On follow up 06/10/22, he is in SR today. ILR review through [employer] site today shows no new episodes of Afib since initial episode on 05/09/22. He feels well overall. He is compliant with Toprol  and no missed doses of Eliquis .   On follow up 12/10/22, he is currently in NSR. ILR review through [employer] site today shows no new episodes of Afib since June. He feels well overall. He is compliant with Toprol  and Eliquis . No new episodes of epistaxis.  On follow up 07/08/23, he is currently in NSR. Recent ILR review shows no new episodes of Afib. Patient feels well overall. No bleeding issues on Eliquis .    Follow-up 01/19/2024.  Patient is currently in NSR.  Review of ILR shows 2 episodes of A-fib this year, one in August and one in October both for less than 3 hours.  Patient feels overall well and does not have any bleeding issues on Eliquis .  Today, he denies symptoms of palpitations, chest pain, shortness of breath, orthopnea, PND, lower extremity edema, dizziness, presyncope, syncope, snoring, daytime somnolence, bleeding, or neurologic sequela. The patient is tolerating medications without difficulties and is otherwise without complaint today.   Atrial Fibrillation Risk Factors:  he does not have symptoms or diagnosis of sleep apnea. he does not have a history of rheumatic fever. he does not have a history of alcohol use. The patient does have a history of early familial atrial fibrillation or other arrhythmias.  he has a BMI of Body mass index is 38.92 kg/m.SABRA Filed Weights   01/19/24 0843  Weight: 116.1 kg    Family History  Problem Relation Age of Onset   Diabetes Mother    COPD Mother    Cancer Mother        unknown type    Heart disease Father    COPD Sister    Healthy Brother    Heart disease Brother    Healthy Son    Colon cancer Neg Hx    Colon polyps Neg Hx    Esophageal cancer Neg Hx    Rectal cancer Neg Hx    Stomach cancer Neg Hx  Inflammatory bowel disease Neg Hx    Liver disease Neg Hx    Pancreatic cancer Neg Hx      Atrial Fibrillation Management history:  Previous antiarrhythmic drugs: None Previous cardioversions: None Previous ablations: None Anticoagulation history: Eliquis  5 mg BID   Past Medical History:  Diagnosis Date   Arthritis    Cataract    Mixed OU   Diabetes mellitus without complication (HCC)    Diabetic retinopathy (HCC)    NPDR OU   Dysrhythmia    afib   Hyperlipidemia    Hypertension    Hypertensive retinopathy    OU   Obesity    Stroke [facility])    Past Surgical History:  Procedure Laterality Date   BIOPSY   11/29/2019   Procedure: BIOPSY;  Surgeon: Wilhelmenia Aloha Raddle., MD;  Location: [facility];  Service: Gastroenterology;;   COLONOSCOPY WITH PROPOFOL  N/A 11/29/2019   Procedure: COLONOSCOPY WITH PROPOFOL ;  Surgeon: Wilhelmenia Aloha Raddle., MD;  Location: [facility];  Service: Gastroenterology;  Laterality: N/A;   COLONOSCOPY WITH PROPOFOL  N/A 05/13/2023   Procedure: COLONOSCOPY WITH PROPOFOL ;  Surgeon: Wilhelmenia Aloha Raddle., MD;  Location: [facility];  Service: Gastroenterology;  Laterality: N/A;   ENDOSCOPIC MUCOSAL RESECTION N/A 11/29/2019   Procedure: ENDOSCOPIC MUCOSAL RESECTION;  Surgeon: Wilhelmenia Aloha Raddle., MD;  Location: [facility];  Service: Gastroenterology;  Laterality: N/A;   ESOPHAGOGASTRODUODENOSCOPY (EGD) WITH PROPOFOL  N/A 11/29/2019   Procedure: ESOPHAGOGASTRODUODENOSCOPY (EGD) WITH PROPOFOL ;  Surgeon: Wilhelmenia Aloha Raddle., MD;  Location: [facility];  Service: Gastroenterology;  Laterality: N/A;   EYE SURGERY Left    had to have eye flushed after getting costic sodium in L eye   HEMOSTASIS CLIP PLACEMENT  11/29/2019   Procedure: HEMOSTASIS CLIP PLACEMENT;  Surgeon: Wilhelmenia Aloha Raddle., MD;  Location: [facility];  Service: Gastroenterology;;   HERNIA REPAIR     HERNIA REPAIR     35 years ago   HOT HEMOSTASIS N/A 11/29/2019   Procedure: HOT HEMOSTASIS (ARGON PLASMA COAGULATION/BICAP);  Surgeon: Wilhelmenia Aloha Raddle., MD;  Location: [facility];  Service: Gastroenterology;  Laterality: N/A;   JOINT REPLACEMENT  2021   Left knee replacement   LEFT HEART CATH AND CORONARY ANGIOGRAPHY N/A 04/11/2019   Procedure: LEFT HEART CATH AND CORONARY ANGIOGRAPHY;  Surgeon: Ladona Heinz, MD;  Location: [facility];  Service: Cardiovascular;  Laterality: N/A;   POLYPECTOMY  11/29/2019   Procedure: POLYPECTOMY;  Surgeon: Wilhelmenia Aloha Raddle., MD;  Location: [facility];  Service: Gastroenterology;;   POLYPECTOMY  05/13/2023   Procedure: POLYPECTOMY,  INTESTINE;  Surgeon: Wilhelmenia Aloha Raddle., MD;  Location: [facility];  Service: Gastroenterology;;   RENAL ANGIOGRAPHY Bilateral 04/11/2019   Procedure: RENAL ANGIOGRAPHY;  Surgeon: Ladona Heinz, MD;  Location: [facility] INVASIVE CV LAB;  Service: Cardiovascular;  Laterality: Bilateral;   SUBMUCOSAL LIFTING INJECTION  11/29/2019   Procedure: SUBMUCOSAL LIFTING INJECTION;  Surgeon: Wilhelmenia Aloha Raddle., MD;  Location: [facility];  Service: Gastroenterology;;   TOTAL KNEE ARTHROPLASTY Left 08/01/2019   Procedure: LEFT TOTAL KNEE ARTHROPLASTY;  Surgeon: Addie Cordella Hamilton, MD;  Location: [facility] OR;  Service: Orthopedics;  Laterality: Left;    Current Outpatient Medications  Medication Sig Dispense Refill   amLODIPine -Valsartan -HCTZ 10-320-25 MG TABS Take 1 tablet by mouth daily in the afternoon. 90 tablet 3   atorvastatin  (LIPITOR ) 80 MG tablet Take 1 tablet (80 mg total) by mouth daily. 90 tablet 3   blood glucose meter kit and supplies KIT Dispense based on patient and insurance preference. Use  up to four times daily as directed. 1 each 0   ELIQUIS  5 MG TABS tablet TAKE 1 TABLET BY MOUTH TWICE A DAY 60 tablet 6   empagliflozin  (JARDIANCE ) 25 MG TABS tablet Take 1 tablet (25 mg total) by mouth daily. 90 tablet 3   metFORMIN  (GLUCOPHAGE ) 500 MG tablet TAKE 1 TABLET BY MOUTH 2 TIMES DAILY WITH A MEAL. 180 tablet 3   metoprolol  succinate (TOPROL -XL) 50 MG 24 hr tablet TAKE 1 TABLET BY MOUTH DAILY. TAKE WITH OR IMMEDIATELY FOLLOWING A MEAL. 90 tablet 3   Semaglutide , 1 MG/DOSE, 4 MG/3ML SOPN Inject 1 mg as directed once a week. 9 mL 3   No current facility-administered medications for this encounter.    No Known Allergies  ROS- All systems are reviewed and negative except as per the HPI above.  Physical Exam: Vitals:   01/19/24 0843  BP: 134/72  Pulse: 76  Weight: 116.1 kg  Height: 5' 8 (1.727 m)    GEN- The patient is well appearing, alert and oriented x 3 today.   Neck - no JVD or  carotid bruit noted Lungs- Clear to ausculation bilaterally, normal work of breathing Heart- Regular rate and rhythm, no murmurs, rubs or gallops, PMI not laterally displaced Extremities- no clubbing, cyanosis, or edema Skin - no rash or ecchymosis noted  Wt Readings from Last 3 Encounters:  01/19/24 116.1 kg  12/02/23 120.2 kg  08/24/23 119.8 kg    EKG today demonstrates: EKG Interpretation Date/Time:  Wednesday January 19 2024 08:46:08 EST Ventricular Rate:  76 PR Interval:  234 QRS Duration:  142 QT Interval:  412 QTC Calculation: 463 R Axis:   3  Text Interpretation: Sinus rhythm with 1st degree A-V block Right bundle branch block Abnormal ECG When compared with ECG of 08-Jul-2023 10:44, PREVIOUS ECG IS PRESENT Confirmed by Terra Pac ([id number]) on 01/19/2024 10:07:29 AM    Echo 02/20/21 demonstrated:  1. Moderate concentric LVH with proximal septal thickening.   2. Left ventricular ejection fraction, by estimation, is 70 to 75%. The  left ventricle has hyperdynamic function. The left ventricle has no  regional wall motion abnormalities. There is moderate concentric left  ventricular hypertrophy. Left ventricular  diastolic parameters are consistent with Grade I diastolic dysfunction  (impaired relaxation). Elevated left atrial pressure.   3. Right ventricular systolic function is normal. The right ventricular  size is normal.   4. The mitral valve is normal in structure. No evidence of mitral valve  regurgitation. No evidence of mitral stenosis.   5. The aortic valve is tricuspid. Aortic valve regurgitation is not  visualized. Aortic valve sclerosis/calcification is present, without any  evidence of aortic stenosis.   6. The inferior vena cava is normal in size with greater than 50%  respiratory variability, suggesting right atrial pressure of 3 mmHg.    Epic records are reviewed at length today.  CHA2DS2-VASc Score = 6  The patient's score is based upon: CHF  History: 0 HTN History: 1 Diabetes History: 1 Stroke History: 2 Vascular Disease History: 1 Age Score: 1 Gender Score: 0       ASSESSMENT AND PLAN: Paroxysmal Atrial Fibrillation (ICD10:  I48.0) The patient's CHA2DS2-VASc score is 6, indicating a 9.7% annual risk of stroke.    Patient appears to be maintaining SR overall.  He has very low A-fib burden.  Will continue conservative observation via subsequent ILR checks.  Continue Toprol  50 mg daily.  2. Secondary Hypercoagulable State (ICD10:  D68.69) The  patient is at significant risk for stroke/thromboembolism based upon his CHA2DS2-VASc Score of 6.  Continue Apixaban  (Eliquis ).   Continue Eliquis  5 mg twice daily.   3. Obesity Body mass index is 38.92 kg/m. Encouraged exercise as tolerated.  4. Snoring with concern for Obstructive sleep apnea He defers sleep study at this time.   5. HTN Stable today.   Follow up in 6 months Afib clinic.   Fairy Heinrich, PA-C Afib Clinic [facility] [address] [place], [place] [address] [phone] 01/19/2024 10:07 AM

## 2024-01-20 ENCOUNTER — Ambulatory Visit

## 2024-01-21 NOTE — Progress Notes (Signed)
 Remote Loop Recorder Transmission

## 2024-01-30 ENCOUNTER — Ambulatory Visit: Payer: Self-pay | Admitting: Internal Medicine

## 2024-02-06 ENCOUNTER — Other Ambulatory Visit: Payer: Self-pay | Admitting: Family Medicine

## 2024-02-06 DIAGNOSIS — E1122 Type 2 diabetes mellitus with diabetic chronic kidney disease: Secondary | ICD-10-CM

## 2024-02-07 ENCOUNTER — Ambulatory Visit: Payer: Medicare Other

## 2024-02-07 DIAGNOSIS — E1122 Type 2 diabetes mellitus with diabetic chronic kidney disease: Secondary | ICD-10-CM

## 2024-02-07 DIAGNOSIS — I152 Hypertension secondary to endocrine disorders: Secondary | ICD-10-CM

## 2024-02-07 NOTE — Addendum Note (Signed)
 Addended by: SEBASTIAN CROCK B on: 02/07/2024 12:56 PM   Modules accepted: Orders

## 2024-02-14 ENCOUNTER — Ambulatory Visit: Payer: Medicare Other

## 2024-02-17 ENCOUNTER — Ambulatory Visit

## 2024-02-17 DIAGNOSIS — I480 Paroxysmal atrial fibrillation: Secondary | ICD-10-CM | POA: Diagnosis not present

## 2024-02-17 LAB — CUP PACEART REMOTE DEVICE CHECK
Date Time Interrogation Session: 20260114231615
Implantable Pulse Generator Implant Date: 20230407

## 2024-02-18 ENCOUNTER — Ambulatory Visit: Payer: Self-pay | Admitting: Cardiovascular Disease

## 2024-02-21 ENCOUNTER — Ambulatory Visit

## 2024-02-25 NOTE — Progress Notes (Signed)
 Remote Loop Recorder Transmission

## 2024-03-13 ENCOUNTER — Ambulatory Visit: Payer: Medicare Other

## 2024-03-19 ENCOUNTER — Ambulatory Visit

## 2024-03-20 ENCOUNTER — Ambulatory Visit: Payer: Medicare Other

## 2024-03-23 ENCOUNTER — Ambulatory Visit

## 2024-04-17 ENCOUNTER — Ambulatory Visit: Payer: Medicare Other

## 2024-04-19 ENCOUNTER — Ambulatory Visit

## 2024-04-24 ENCOUNTER — Ambulatory Visit

## 2024-04-24 ENCOUNTER — Ambulatory Visit: Payer: Medicare Other

## 2024-05-20 ENCOUNTER — Ambulatory Visit

## 2024-05-22 ENCOUNTER — Ambulatory Visit: Payer: Medicare Other

## 2024-05-25 ENCOUNTER — Ambulatory Visit

## 2024-05-29 ENCOUNTER — Ambulatory Visit: Payer: Medicare Other

## 2024-06-20 ENCOUNTER — Ambulatory Visit

## 2024-06-27 ENCOUNTER — Ambulatory Visit

## 2024-07-03 ENCOUNTER — Ambulatory Visit: Payer: Medicare Other

## 2024-07-19 ENCOUNTER — Ambulatory Visit (HOSPITAL_COMMUNITY): Admitting: Internal Medicine

## 2024-07-21 ENCOUNTER — Ambulatory Visit

## 2024-07-27 ENCOUNTER — Ambulatory Visit

## 2024-07-31 ENCOUNTER — Ambulatory Visit: Payer: Medicare Other

## 2024-08-07 ENCOUNTER — Ambulatory Visit: Payer: Medicare Other

## 2024-09-04 ENCOUNTER — Ambulatory Visit: Payer: Medicare Other

## 2024-09-11 ENCOUNTER — Ambulatory Visit: Payer: Medicare Other

## 2024-10-09 ENCOUNTER — Ambulatory Visit: Payer: Medicare Other

## 2024-11-13 ENCOUNTER — Ambulatory Visit: Payer: Medicare Other
